# Patient Record
Sex: Female | Born: 1940
Health system: Southern US, Community
[De-identification: ages and names within clinical notes are randomized; demographics above are authoritative.]

## PROBLEM LIST (undated history)

## (undated) DIAGNOSIS — E785 Hyperlipidemia, unspecified: Secondary | ICD-10-CM

## (undated) DIAGNOSIS — Z9289 Personal history of other medical treatment: Secondary | ICD-10-CM

## (undated) DIAGNOSIS — Z860100 Personal history of colon polyps, unspecified: Secondary | ICD-10-CM

## (undated) DIAGNOSIS — I10 Essential (primary) hypertension: Secondary | ICD-10-CM

## (undated) DIAGNOSIS — S069XAA Unspecified intracranial injury with loss of consciousness status unknown, initial encounter: Secondary | ICD-10-CM

## (undated) DIAGNOSIS — G231 Progressive supranuclear ophthalmoplegia [Steele-Richardson-Olszewski]: Secondary | ICD-10-CM

## (undated) DIAGNOSIS — I8393 Asymptomatic varicose veins of bilateral lower extremities: Secondary | ICD-10-CM

## (undated) DIAGNOSIS — IMO0002 Reserved for concepts with insufficient information to code with codable children: Secondary | ICD-10-CM

## (undated) HISTORY — DX: Reserved for concepts with insufficient information to code with codable children: IMO0002

## (undated) HISTORY — DX: Personal history of other medical treatment: Z92.89

## (undated) HISTORY — DX: Personal history of colonic polyps: Z86.010

## (undated) HISTORY — PX: TUBAL LIGATION: SHX77

## (undated) HISTORY — DX: Personal history of colon polyps, unspecified: Z86.0100

## (undated) HISTORY — PX: TONSILLECTOMY: SHX5217

## (undated) HISTORY — DX: Essential (primary) hypertension: I10

## (undated) HISTORY — DX: Hyperlipidemia, unspecified: E78.5

## (undated) HISTORY — DX: Asymptomatic varicose veins of bilateral lower extremities: I83.93

## (undated) HISTORY — PX: POLYPECTOMY: SHX149

---

## 1998-11-12 ENCOUNTER — Other Ambulatory Visit: Admission: RE | Admit: 1998-11-12 | Discharge: 1998-11-12 | Payer: Self-pay | Admitting: Internal Medicine

## 1999-07-28 ENCOUNTER — Ambulatory Visit (HOSPITAL_COMMUNITY): Admission: RE | Admit: 1999-07-28 | Discharge: 1999-07-28 | Payer: Self-pay | Admitting: Gastroenterology

## 1999-07-28 ENCOUNTER — Encounter (INDEPENDENT_AMBULATORY_CARE_PROVIDER_SITE_OTHER): Payer: Self-pay | Admitting: Specialist

## 2000-02-07 DIAGNOSIS — Z9289 Personal history of other medical treatment: Secondary | ICD-10-CM

## 2000-02-07 HISTORY — DX: Personal history of other medical treatment: Z92.89

## 2000-06-30 ENCOUNTER — Other Ambulatory Visit: Admission: RE | Admit: 2000-06-30 | Discharge: 2000-06-30 | Payer: Self-pay | Admitting: Internal Medicine

## 2003-09-28 ENCOUNTER — Other Ambulatory Visit: Admission: RE | Admit: 2003-09-28 | Discharge: 2003-09-28 | Payer: Self-pay | Admitting: Internal Medicine

## 2004-05-09 ENCOUNTER — Ambulatory Visit: Payer: Self-pay | Admitting: Internal Medicine

## 2005-01-21 ENCOUNTER — Ambulatory Visit: Payer: Self-pay | Admitting: Internal Medicine

## 2005-02-11 ENCOUNTER — Ambulatory Visit: Payer: Self-pay | Admitting: Internal Medicine

## 2005-02-18 ENCOUNTER — Ambulatory Visit: Payer: Self-pay | Admitting: Internal Medicine

## 2005-03-09 LAB — HM MAMMOGRAPHY: HM Mammogram: NORMAL

## 2005-05-20 ENCOUNTER — Ambulatory Visit: Payer: Self-pay | Admitting: Internal Medicine

## 2005-06-30 ENCOUNTER — Ambulatory Visit: Payer: Self-pay | Admitting: Internal Medicine

## 2005-07-07 ENCOUNTER — Ambulatory Visit: Payer: Self-pay | Admitting: Internal Medicine

## 2005-07-07 ENCOUNTER — Other Ambulatory Visit: Admission: RE | Admit: 2005-07-07 | Discharge: 2005-07-07 | Payer: Self-pay | Admitting: Internal Medicine

## 2005-07-07 ENCOUNTER — Encounter: Payer: Self-pay | Admitting: Internal Medicine

## 2005-12-28 ENCOUNTER — Ambulatory Visit: Payer: Self-pay | Admitting: Internal Medicine

## 2006-05-25 ENCOUNTER — Ambulatory Visit: Payer: Self-pay | Admitting: Internal Medicine

## 2008-07-07 DIAGNOSIS — IMO0002 Reserved for concepts with insufficient information to code with codable children: Secondary | ICD-10-CM

## 2008-07-07 HISTORY — DX: Reserved for concepts with insufficient information to code with codable children: IMO0002

## 2008-09-17 ENCOUNTER — Ambulatory Visit: Payer: Self-pay | Admitting: Internal Medicine

## 2008-09-17 DIAGNOSIS — Z860100 Personal history of colon polyps, unspecified: Secondary | ICD-10-CM | POA: Insufficient documentation

## 2008-09-17 DIAGNOSIS — Z872 Personal history of diseases of the skin and subcutaneous tissue: Secondary | ICD-10-CM | POA: Insufficient documentation

## 2008-09-17 DIAGNOSIS — R030 Elevated blood-pressure reading, without diagnosis of hypertension: Secondary | ICD-10-CM | POA: Insufficient documentation

## 2008-09-17 DIAGNOSIS — E785 Hyperlipidemia, unspecified: Secondary | ICD-10-CM

## 2008-09-17 LAB — CONVERTED CEMR LAB
Albumin: 4.2 g/dL (ref 3.5–5.2)
BUN: 13 mg/dL (ref 6–23)
Basophils Absolute: 0 10*3/uL (ref 0.0–0.1)
Calcium: 9.1 mg/dL (ref 8.4–10.5)
Cholesterol: 264 mg/dL — ABNORMAL HIGH (ref 0–200)
Creatinine, Ser: 0.9 mg/dL (ref 0.4–1.2)
Direct LDL: 166.4 mg/dL
Eosinophils Absolute: 0 10*3/uL (ref 0.0–0.7)
GFR calc non Af Amer: 66.03 mL/min (ref >60)
Glucose, Bld: 100 mg/dL — ABNORMAL HIGH (ref 70–99)
HDL: 81.8 mg/dL (ref >39.00)
Lymphocytes Relative: 23.1 % (ref 12.0–46.0)
MCHC: 34.9 g/dL (ref 30.0–36.0)
MCV: 93 fL (ref 78.0–100.0)
Monocytes Absolute: 0.5 10*3/uL (ref 0.1–1.0)
Neutro Abs: 4.5 10*3/uL (ref 1.4–7.7)
Neutrophils Relative %: 68.4 % (ref 43.0–77.0)
RDW: 12.7 % (ref 11.5–14.6)
VLDL: 11.4 mg/dL (ref 0.0–40.0)

## 2008-09-18 ENCOUNTER — Encounter: Payer: Self-pay | Admitting: Internal Medicine

## 2008-09-18 LAB — CONVERTED CEMR LAB: Vit D, 25-Hydroxy: 19 ng/mL — ABNORMAL LOW (ref 30–89)

## 2008-09-27 ENCOUNTER — Ambulatory Visit: Payer: Self-pay | Admitting: Internal Medicine

## 2008-09-27 DIAGNOSIS — E559 Vitamin D deficiency, unspecified: Secondary | ICD-10-CM

## 2009-09-25 ENCOUNTER — Other Ambulatory Visit: Admission: RE | Admit: 2009-09-25 | Discharge: 2009-09-25 | Payer: Self-pay | Admitting: Internal Medicine

## 2009-09-25 ENCOUNTER — Ambulatory Visit: Payer: Self-pay | Admitting: Internal Medicine

## 2009-09-25 DIAGNOSIS — F418 Other specified anxiety disorders: Secondary | ICD-10-CM | POA: Insufficient documentation

## 2009-09-25 DIAGNOSIS — F438 Other reactions to severe stress: Secondary | ICD-10-CM | POA: Insufficient documentation

## 2009-09-25 DIAGNOSIS — M129 Arthropathy, unspecified: Secondary | ICD-10-CM | POA: Insufficient documentation

## 2009-09-25 LAB — HM PAP SMEAR

## 2009-10-04 ENCOUNTER — Ambulatory Visit: Payer: Self-pay | Admitting: Internal Medicine

## 2009-12-27 ENCOUNTER — Ambulatory Visit: Payer: Self-pay | Admitting: Internal Medicine

## 2009-12-27 LAB — CONVERTED CEMR LAB
Direct LDL: 186.2 mg/dL
Total CHOL/HDL Ratio: 4
VLDL: 19 mg/dL (ref 0.0–40.0)

## 2010-01-03 ENCOUNTER — Ambulatory Visit: Payer: Self-pay | Admitting: Internal Medicine

## 2010-04-06 LAB — CONVERTED CEMR LAB
BUN: 15 mg/dL (ref 6–23)
Basophils Absolute: 0 10*3/uL (ref 0.0–0.1)
Bilirubin, Direct: 0.1 mg/dL (ref 0.0–0.3)
CO2: 28 meq/L (ref 19–32)
Calcium: 9.6 mg/dL (ref 8.4–10.5)
Cholesterol: 305 mg/dL — ABNORMAL HIGH (ref 0–200)
Creatinine, Ser: 0.8 mg/dL (ref 0.4–1.2)
Eosinophils Absolute: 0.1 10*3/uL (ref 0.0–0.7)
HCT: 43.3 % (ref 36.0–46.0)
Hemoglobin: 15 g/dL (ref 12.0–15.0)
Lymphs Abs: 2 10*3/uL (ref 0.7–4.0)
MCHC: 34.5 g/dL (ref 30.0–36.0)
MCV: 94.3 fL (ref 78.0–100.0)
Monocytes Absolute: 0.6 10*3/uL (ref 0.1–1.0)
Neutro Abs: 4.5 10*3/uL (ref 1.4–7.7)
Platelets: 291 10*3/uL (ref 150.0–400.0)
RDW: 13.6 % (ref 11.5–14.6)
TSH: 1.6 microintl units/mL (ref 0.35–5.50)
Total CHOL/HDL Ratio: 4
Total Protein: 7.1 g/dL (ref 6.0–8.3)
Triglycerides: 92 mg/dL (ref 0.0–149.0)
Uric Acid, Serum: 6 mg/dL (ref 2.4–7.0)

## 2010-04-08 NOTE — Assessment & Plan Note (Signed)
Summary: 3 month fup///ccm   Vital Signs:  Patient profile:   70 year old female Menstrual status:  postmenopausal Weight:      145 pounds BMI:     27.27 Pulse rate:   80 / minute BP sitting:   170 / 90  (left arm) Cuff size:   regular  Vitals Entered By: Romualdo Bolk, CMA (AAMA) (January 03, 2010 11:01 AM)  Serial Vital Signs/Assessments:  Time      Position  BP       Pulse  Resp  Temp     By 11:04 AM            179/100                        Romualdo Bolk, CMA (AAMA)                     160/88                         [name] MD  Comments: 11:04 AM Pt's machine By: Romualdo Bolk, CMA (AAMA)  left arm sitting By: [name] MD   CC: follow-up visit   History of Present Illness: Pamela Alvarez comes in today  for  above . with BP  and  Since last visit  she has implemeted som lifestyle changes   exercising  well and   feels well  She brings in her BP machine as instructed . Her BP   readings  are usually below 130 at home  .  as they were this am  .   she takes her reasing almost daily.,  She thinks her bp elevates with stress   .  and not frequently.  alcohol almost daily but 1-2 and not excessive   Preventive Screening-Counseling & Management  Alcohol-Tobacco     Alcohol drinks/day: 1     Alcohol type: wine     Smoking Status: never     Passive Smoke Exposure: no  Caffeine-Diet-Exercise     Caffeine use/day: 2     Does Patient Exercise: yes     Type of exercise: walking  Current Medications (verified): 1)  Alprazolam 0.25 Mg Tabs (Alprazolam) .Marland Kitchen.. 1 By Mouth As Needed Pre Flight  Allergies (verified): 1)  ! Sulfa  Past History:  Past medical, surgical, family and social histories (including risk factors) reviewed, and no changes noted (except as noted below).  Past Medical History: Reviewed history from 09/17/2008 and no changes required. Hyperlipidemia Colonic polyps, hx of Echo 12/01   Cyst on back x 2 that were  drained May 2010 G3 P2  Past Surgical History: Reviewed history from 09/17/2008 and no changes required. Tonsillectomy Tubal ligation Colon polypectomy  Past History:  Care Management: G[facility]enterology: [facility] Taffeen:  [facility]   Family History: Reviewed history from 09/17/2008 and no changes required. Father: Heart Problems, angina and heart and lung cancer, skin cancer, colon cancer Mother: HBP, Parkinson's  Siblings: Brother-triple bypass surgery age 62, sister-breast cancer over 50 and dm, sister-macular dengeration, PAD and hearing problems, prediabetic. Sis with vasculitis   Social History: Reviewed history from 10/04/2009 and no changes required. 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    Physical Exam  General:  Well-developed,well-nourished,in no acute distress; alert,appropriate and cooperative throughout examination Head:  normocephalic and atraumatic.   Eyes:  vision grossly intact.   Neck:  No deformities, masses, or tenderness noted. Lungs:  normal respiratory effort and no intercostal retractions.   Heart:  normal rate, regular rhythm, and no murmur.   Pulses:  pulses intact without delay   Extremities:  no clubbing cyanosis or edema  Neurologic:  non focal  Psych:  Oriented X3, good eye contact, not anxious appearing, and not depressed appearing.      Impression & Recommendations:  Problem # 1:  HYPERTENSION, WHITE COAT (ICD-796.2) impressive readings  here but same machine records normal at home.   would rx if  mostly up  try the  xanax  under stress to see if changes the readings.  Problem # 2:  HYPERLIPIDEMIA (ICD-272.4) Assessment: Improved still quite high    reviewed flow sheet     rec get ldl to below 160  disc meds and consideration of low dose lipitor if needed.  she will continue lifestyle intervention and  plan repeat at check up.  call in the meantime .  would not use supplements to treat either condition as not   tested.   Complete Medication List: 1)  Alprazolam 0.25 Mg Tabs (Alprazolam) .Marland Kitchen.. 1 by mouth as needed pre flight  Other Orders: Admin 1st Vaccine (60454) Flu Vaccine 81yrs + (09811)  Patient Instructions: 1)  continue lifestyle intervention  . for your lipids and BP 2)  DASh and Metiterranean diet.  3)  Check bp readings after exercise and stress situations and can try the xanax about how  BP is then.  4)  Call if  want to try lipid med. 5)  Call if BP readings are not controlled more than 80% of the time or stays high 150 and over.  6)  Check up in July 2012.  7)  call in meantime   .    Orders Added: 1)  Admin 1st Vaccine [90471] 2)  Flu Vaccine 70yrs + [91478] 3)  Est. Patient Level IV [29562]   Flu Vaccine Consent Questions     Do you have a history of severe allergic reactions to this vaccine? no    Any prior history of allergic reactions to egg and/or gelatin? no    Do you have a sensitivity to the preservative Thimersol? no    Do you have a past history of Guillan-Barre Syndrome? no    Do you currently have an acute febrile illness? no    Have you ever had a severe reaction to latex? no    Vaccine information given and explained to patient? yes    Are you currently pregnant? no    Lot Number:[id number]   Exp Date:09/06/2010   Site Given  Left Deltoid IM .lbflu Romualdo Bolk, CMA (AAMA)  January 03, 2010 11:03 AM greater than 50% of visit spent in counseling   25 mintues

## 2010-04-08 NOTE — Assessment & Plan Note (Signed)
Summary: cpx/pt coming in fasting/cjr   Vital Signs:  Patient profile:   70 year old female Menstrual status:  postmenopausal Height:      61.25 inches Weight:      145 pounds BMI:     27.27 Pulse rate:   66 / minute BP sitting:   170 / 90  (left arm) Cuff size:   regular  Vitals Entered By: Romualdo Bolk, CMA (AAMA) (September 25, 2009 9:35 AM)  Serial Vital Signs/Assessments:  Time      Position  BP       Pulse  Resp  Temp     By                     165/70                         [name] MD  Comments: sitting  reg cuff By: [name] MD   CC: CPX- Pt is fasting for labs- Discuss doing a pap- Pt wants to discuss something to put on cuts that she gets a home besides neomycin.   History of Present Illness: Pamela Alvarez comes in today  for yearly check  Medicare AWV:  1.   Risk factors based on Past M, S, F history: hx of elevated lipids and bp readings  2.   Physical Activities:   regular  no limitations  3.   Depression/mood:     no depression  gets anxious at doctors  4.   Hearing: no  limitaiton 5.   ADL's:   Independent  6.   Fall Risk:  none  7.   Home Safety:  adressed  8.   Height, weight, &visual acuity:   SEEs   Dr Lovell Sheehan   9.   Counseling:  10.   Labs ordered based on risk factors:  see below  11.           Referral Coordination 12.           Care Plan 13.            Cognitive Assessment    Pt is A&Ox3,affect,speech,memory,attention,&motor skills appear intact.  BP readings at home.     149/  132   124   /70  .Marland KitchenMarland Kitchen..145...126/74    131/68.    Refill xanax as needed.   never got the last one filled but would like to be re written UTD on HCM but declines a        dexa.     Preventive Care Screening  Prior Values:    Pap Smear:  normal (07/07/2005)    Mammogram:  normal (03/09/2005)    Colonoscopy:  Done (04/30/2005)    Last Tetanus Booster:  Td (07/07/2008)    Last Pneumovax:  Pneumovax (09/17/2008)   Preventive Screening-Counseling &  Management  Alcohol-Tobacco     Alcohol drinks/day: 1     Alcohol type: wine     Smoking Status: never     Passive Smoke Exposure: no  Caffeine-Diet-Exercise     Caffeine use/day: 2     Does Patient Exercise: yes     Type of exercise: walking  Comments: denies depression  Hep-HIV-STD-Contraception     Dental Visit-last 6 months yes     Sun Exposure-Excessive: no  Safety-Violence-Falls     Seat Belt Use: yes     Firearms in the Home: no firearms in the home  Smoke Detectors: yes     Fall Risk: no     Fall Risk Counseling: not indicated; no significant falls noted  Current Medications (verified): 1)  Alprazolam 0.25 Mg Tabs (Alprazolam) .Marland Kitchen.. 1 By Mouth As Needed Pre Flight  Allergies (verified): 1)  ! Sulfa  Past History:  Past medical, surgical, family and social histories (including risk factors) reviewed, and no changes noted (except as noted below).  Past Medical History: Reviewed history from 09/17/2008 and no changes required. Hyperlipidemia Colonic polyps, hx of Echo 12/01   Cyst on back x 2 that were drained May 2010 G3 P2  Past Surgical History: Reviewed history from 09/17/2008 and no changes required. Tonsillectomy Tubal ligation Colon polypectomy  Past History:  Care Management: [facility]enterology: [facility] Taffeen:  [facility]   Family History: Reviewed history from 09/17/2008 and no changes required. Father: Heart Problems, angina and heart and lung cancer, skin cancer, colon cancer Mother: HBP, Parkinson's  Siblings: Brother-triple bypass surgery age 35, sister-breast cancer over 50 and dm, sister-macular dengeration, PAD and hearing problems, prediabetic. Sis with vasculitis   Social History: Reviewed history from 09/17/2008 and no changes required. 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         bumps on hands   arthritis    non cancerous.   rest of ros negative   12 system   Physical Exam  General:  Well-developed,well-nourished,in no acute distress; alert,appropriate and cooperative throughout examination Head:  Normocephalic and atraumatic without obvious abnormalities. No apparent alopecia or balding. Eyes:  PERRL, EOMs full, conjunctiva clear   glasses Ears:  R ear normal, L ear normal, and no external deformities.   Nose:  no external deformity, no external erythema, and no nasal discharge.   Mouth:  pharynx pink and moist.   Neck:  No deformities, masses, or tenderness noted. Chest Wall:  No deformities, masses, or tenderness noted. Breasts:  No mass, nodules, thickening, tenderness, bulging, retraction, inflamation, nipple discharge or skin changes noted.   Lungs:  Normal respiratory effort, chest expands symmetrically. Lungs are clear to auscultation, no crackles or wheezes.no dullness.   Heart:  Normal rate and regular rhythm. S1 and S2 normal without gallop, murmur, click, rub or other extra sounds.no lifts.   Abdomen:  Bowel sounds positive,abdomen soft and non-tender without masses, organomegaly or hernias noted. Rectal:  No external abnormalities noted. Normal sphincter tone. No rectal masses or tenderness. Genitalia:  Pelvic Exam:        External: normal female genitalia without lesions or masses        Vagina: normal without lesions or masses        Cervix: normal without lesions or masses        Adnexa: normal bimanual exam without masses or  fullness        Uterus: normal by palpation        Pap smear: performed Msk:  oa changes  with nodule left dip joint  non tenderno redness over joints.   Pulses:  pulses intact without delay  Extremities:  no clubbing cyanosis or edema  Neurologic:  alert & oriented X3, cranial nerves II-XII intact, strength normal in all extremities, gait normal, and DTRs symmetrical and normal.   A&Ox3,affect,speech,memory,attention,&motor skills appear intact.  Skin:  turgor normal, color normal, no petechiae, and no purpura.  sunchanges  Cervical Nodes:  No lymphadenopathy noted Axillary Nodes:  No palpable lymphadenopathy Inguinal Nodes:  No significant adenopathy Psych:  Oriented X3, normally interactive, good eye contact, not depressed appearing, and slightly anxious.   EKG  nsr  except rate 100      90 during exam.   Impression & Recommendations:  Problem # 1:  HEALTH MAINTENANCE EXAM, ADULT (ICD-V70.0) medicare wellness declined Dexa  UTD on other parameters   counseled    Discussed nutrition,exercise,diet,healthy weight, vitamin D and calcium.   Orders: EKG w/ Interpretation (93000) First annual wellness visit with prevention plan  (U4403) Pelvic & Breast Exam ( Medicare)  (G0101) Obtaining Screening PAP Smear (K7425)  Problem # 2:  ELEVATED BLOOD PRESSURE WITHOUT DIAGNOSIS OF HYPERTENSION (ICD-796.2) reviewed her readings    but still very high  and would like to have her machine correlated withours for obv reasons  as baseline ht should still be treated and is a  risk  Orders: TLB-BMP (Basic Metabolic Panel-BMET) (80048-METABOL) TLB-Hepatic/Liver Function Pnl (80076-HEPATIC) TLB-TSH (Thyroid Stimulating Hormone) (84443-TSH) TLB-Lipid Panel (80061-LIPID) TLB-CBC Platelet - w/Differential (85025-CBCD) TLB-Uric Acid, Blood (84550-URIC) EKG w/ Interpretation (93000)  Problem # 3:  ARTHRITIS (ICD-716.90) some nodules  prob oa  check uric acid  Orders: TLB-Uric Acid, Blood  (84550-URIC)  Problem # 4:  HYPERLIPIDEMIA (ICD-272.4) recheck today     follow up depnding on results  Orders: TLB-BMP (Basic Metabolic Panel-BMET) (80048-METABOL) TLB-Hepatic/Liver Function Pnl (80076-HEPATIC) TLB-TSH (Thyroid Stimulating Hormone) (84443-TSH) TLB-Lipid Panel (80061-LIPID) EKG w/ Interpretation (93000)  Labs Reviewed: SGOT: 33 (09/17/2008)   SGPT: 19 (09/17/2008)  Prior 10 Yr Risk Heart Disease: Not enough information (09/27/2008)   HDL:81.80 (09/17/2008)  Chol:264 (09/17/2008)  Trig:57.0 (09/17/2008)  Problem # 5:  ANXIETY, SITUATIONAL (ICD-308.3) xanax as needed ... Discussed risk benefit    Problem # 6:  COLONIC POLYPS, HX OF (ICD-V12.72) Assessment: Comment Only  Complete Medication List: 1)  Alprazolam 0.25 Mg Tabs (Alprazolam) .Marland Kitchen.. 1 by mouth as needed pre flight  Patient Instructions: 1)  make a  nurse visit to comein with your BP machine   . 2)  rov if elevated Bp at home. 3)  You will be informed of lab results when available.  Prescriptions: ALPRAZOLAM 0.25 MG TABS (ALPRAZOLAM) 1 by mouth as needed pre flight  #20 x 0   Entered and Authorized by:   [name] MD   Signed by:   [name] MD on 09/25/2009   Method used:   Print then Give to Patient   RxID:   [id number]

## 2010-04-08 NOTE — Assessment & Plan Note (Signed)
Summary: Discuss going on cholesterol meds and pt to bring in bp machi...   Vital Signs:  Patient profile:   70 year old female Menstrual status:  postmenopausal Weight:      146 pounds Pulse rate:   72 / minute BP sitting:   160 / 80  (left arm) Cuff size:   regular  Vitals Entered By: Romualdo Bolk, CMA Duncan Dull) (October 04, 2009 2:21 PM) CC: Discuss going on cholesterol meds   History of Present Illness: Pamela Alvarez comes in today  for elevated lipids and BP issues felt to be white coat. She states that has  not eaten healthy recently and coudl do beter.  No hx of premature CVd in family .  Bp seems ok at  hom e but sometimes  in 140s .... Willing to take med is absolutely has to .  Preventive Screening-Counseling & Management  Alcohol-Tobacco     Alcohol drinks/day: 1     Alcohol type: wine     Smoking Status: never     Passive Smoke Exposure: no  Caffeine-Diet-Exercise     Caffeine use/day: 2     Does Patient Exercise: yes     Type of exercise: walking  Current Medications (verified): 1)  Alprazolam 0.25 Mg Tabs (Alprazolam) .Marland Kitchen.. 1 By Mouth As Needed Pre Flight  Allergies (verified): 1)  ! Sulfa  Past History:  Past medical, surgical, family and social histories (including risk factors) reviewed for relevance to current acute and chronic problems.  Past Medical History: Reviewed history from 09/17/2008 and no changes required. Hyperlipidemia Colonic polyps, hx of Echo 12/01   Cyst on back x 2 that were drained May 2010 G3 P2  Past Surgical History: Reviewed history from 09/17/2008 and no changes required. Tonsillectomy Tubal ligation Colon polypectomy  Past History:  Care Management: [facility]astroenterology: [facility] Taffeen:  [facility]   Family History: Reviewed history from 09/17/2008 and no changes required. Father: Heart Problems, angina and heart and lung cancer, skin cancer, colon cancer Mother: HBP, Parkinson's  Siblings:  Brother-triple bypass surgery age 79, sister-breast cancer over 50 and dm, sister-macular dengeration, PAD and hearing problems, prediabetic. Sis with vasculitis   Social History: Reviewed history from 09/25/2009 and no changes required. 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     Impression & Recommendations:  Problem # 1:  HYPERLIPIDEMIA (ICD-272.4) ldl over 200  160 range in the past reviewed  [place] risk   assessment .   with patient  and imporance of bp control to risk   to elevated to moderate vs lower risk.   Labs Reviewed: SGOT: 28 (09/25/2009)   SGPT: 24 (09/25/2009)  Prior 10 Yr Risk Heart Disease: Not enough information (09/27/2008)   HDL:80.20 (09/25/2009), 81.80 (09/17/2008)  Chol:305 (09/25/2009), 264 (09/17/2008)  Trig:92.0 (09/25/2009), 57.0 (09/17/2008)  Problem # 2:  ELEVATED BLOOD PRESSURE WITHOUT DIAGNOSIS OF HYPERTENSION (ICD-796.2) dis importance of control on risk and consider start a low dose med ifelevated at home.   Complete Medication List: 1)  Alprazolam 0.25 Mg Tabs (Alprazolam) .Marland Kitchen.. 1 by mouth as needed pre flight  Patient Instructions: 1)  intensive lifestyle intervention for your lipids as we discussed 2)  Also monitor your blood pressure and if 140 and above  consistently call and we should consider adding medication. 3)  Lipid  panel prior to visit ICD-9 :  4)  Please schedule a follow-up appointment in 3-4  months .  5)  Bring your blood  pressure   machine to next visit.   greater than 50% of visit spent in counseling   20 minutes

## 2010-07-25 NOTE — Procedures (Signed)
[place]. [facility]  Patient:    Pamela Alvarez, Pamela Alvarez                          MRN: [id number] Proc. Date: 07/28/99 Adm. Date:  [id number] Disc. Date: [id number] Attending:  Rich Brave CC:         Neta Mends. Panosh, M.D. [facility]                           Procedure Report  PROCEDURE PERFORMED:  Colonoscopy with polypectomy and hot biopsies.  ENDOSCOPIST:  Florencia Reasons, M.D.  INDICATIONS FOR PROCEDURE:  The patient is a 70 year old with a family history of colon cancer.  FINDINGS:  Approximately six polyps removed.  Mild diverticulosis.  DESCRIPTION OF PROCEDURE:  The nature, purpose and risks of the procedure had been discussed with the patient, who provided written consent.  At her request, very light sedation was used, totaling fentanyl 45 mcg and Versed 3.5 mg without arrhythmias or desaturation.  The Olympus adjustable tension pediatric video colonoscope was advanced around the colon with just difficulty, entering the terminal ileum for a short distance.  It appeared normal and pullback was performed.  The quality of the prep was excellent and it is felt that all areas were adequately seen.  During insertion of the scope and also during pullback, I encountered a couple of small, sessile, hyperplastic-appearing polyps in the left colon, which I removed by cold biopsy technique.  There was also a linear polyp in a sulcus at about 50 or 55 cm, initially difficult to see, consisting of some sessile verrucous mucosa without an actual morphologic polyp being present.  Multiple (approximately three or four) hot biopsies with generous, but not excessive application of heat were utilized to ablate this lesion and when I was finished, it looked like it had been fairly well coagulated.  At about 15 cm, I encountered two semipedunculated polyps removed by snare technique with complete hemostasis and no evidence of excessive cautery. Retroflexion in the  distal rectum disclosed no additional abnormalities.  The patient tolerated the procedure well and there were no apparent complications.  IMPRESSION:  Five or six colon polyps removed as described above.  The patient also had some small mouthed diverticula in the left colon, no colitis, vascular malformations, large polyps or cancer.  PLAN:  Await pathology on  the polyps, with anticipated colonoscopic follow-up in about three years. DD:  07/28/99 TD:  08/01/99 Job: [id number]

## 2010-12-08 ENCOUNTER — Ambulatory Visit (INDEPENDENT_AMBULATORY_CARE_PROVIDER_SITE_OTHER): Payer: Medicare Other

## 2010-12-08 DIAGNOSIS — Z23 Encounter for immunization: Secondary | ICD-10-CM

## 2011-02-11 ENCOUNTER — Other Ambulatory Visit: Payer: Self-pay | Admitting: Gastroenterology

## 2011-02-11 LAB — HM COLONOSCOPY

## 2011-04-10 ENCOUNTER — Telehealth: Payer: Self-pay | Admitting: *Deleted

## 2011-04-10 NOTE — Telephone Encounter (Signed)
Pt is wanting to be work in sooner than May for a cpx. Where can we work her in at?

## 2011-04-14 NOTE — Telephone Encounter (Signed)
There are lots of Wednesday slots open per guidelines  In [employer] and  Otherwise tuesdays  Monday s Before 2 . See  If this will work for her .  Also  Remember Wednesday is open except for for   slots to remain.  And  dont need to ask about scheduling  Here.

## 2011-04-16 NOTE — Telephone Encounter (Signed)
Pt cpx had already been sch for 05/19/11 at 9:15. This appt was originally made in Oct 2012.

## 2011-05-19 ENCOUNTER — Encounter: Payer: Medicare Other | Admitting: Internal Medicine

## 2011-07-21 ENCOUNTER — Encounter: Payer: Self-pay | Admitting: Internal Medicine

## 2011-07-22 ENCOUNTER — Ambulatory Visit (INDEPENDENT_AMBULATORY_CARE_PROVIDER_SITE_OTHER): Payer: Medicare Other | Admitting: Internal Medicine

## 2011-07-22 ENCOUNTER — Encounter: Payer: Self-pay | Admitting: Internal Medicine

## 2011-07-22 VITALS — BP 140/78 | HR 112 | Temp 97.5°F | Ht 61.5 in | Wt 149.0 lb

## 2011-07-22 DIAGNOSIS — R030 Elevated blood-pressure reading, without diagnosis of hypertension: Secondary | ICD-10-CM

## 2011-07-22 DIAGNOSIS — E785 Hyperlipidemia, unspecified: Secondary | ICD-10-CM

## 2011-07-22 DIAGNOSIS — Z860100 Personal history of colon polyps, unspecified: Secondary | ICD-10-CM

## 2011-07-22 DIAGNOSIS — M129 Arthropathy, unspecified: Secondary | ICD-10-CM

## 2011-07-22 DIAGNOSIS — Z0000 Encounter for general adult medical examination without abnormal findings: Secondary | ICD-10-CM

## 2011-07-22 DIAGNOSIS — Z23 Encounter for immunization: Secondary | ICD-10-CM

## 2011-07-22 LAB — LIPID PANEL
Cholesterol: 284 mg/dL — ABNORMAL HIGH (ref 0–200)
HDL: 78.4 mg/dL (ref >39.00)
Total CHOL/HDL Ratio: 4
Triglycerides: 67 mg/dL (ref 0.0–149.0)
VLDL: 13.4 mg/dL (ref 0.0–40.0)

## 2011-07-22 LAB — CBC WITH DIFFERENTIAL/PLATELET
Basophils Relative: 0.7 % (ref 0.0–3.0)
Eosinophils Relative: 1.6 % (ref 0.0–5.0)
Hemoglobin: 14.9 g/dL (ref 12.0–15.0)
Lymphocytes Relative: 27.8 % (ref 12.0–46.0)
Monocytes Relative: 6.2 % (ref 3.0–12.0)
Neutro Abs: 5 10*3/uL (ref 1.4–7.7)
Neutrophils Relative %: 63.7 % (ref 43.0–77.0)
RBC: 4.8 Mil/uL (ref 3.87–5.11)
WBC: 7.9 10*3/uL (ref 4.5–10.5)

## 2011-07-22 LAB — BASIC METABOLIC PANEL
CO2: 24 mEq/L (ref 19–32)
Calcium: 9.4 mg/dL (ref 8.4–10.5)
GFR: 68.1 mL/min (ref >60.00)
Sodium: 141 mEq/L (ref 135–145)

## 2011-07-22 LAB — HEPATIC FUNCTION PANEL
AST: 28 U/L (ref 0–37)
Albumin: 4 g/dL (ref 3.5–5.2)
Alkaline Phosphatase: 49 U/L (ref 39–117)
Bilirubin, Direct: 0.1 mg/dL (ref 0.0–0.3)
Total Protein: 7.3 g/dL (ref 6.0–8.3)

## 2011-07-22 NOTE — Progress Notes (Signed)
Subjective:    Patient ID: Pamela Alvarez, female    DOB: May 26, 1940, 71 y.o.   MRN: [id number]  HPI Patient comes in today for Preventive Health Care visit  No major changes ; ,injury surgery or hospitalizations. Concern: Finger  Stiff and painful in am   Poss arthritis no others no prgression but here for about 6 months no meds. bp readings at home below 140/90 on a reg bases . Hx of WC effect in the past  LIPIDS no meds healthy eating  vegetarian Taking food and stopped vit d supplements   Hearing:   ok  Vision:  No limitations at present . Glasses   Safety:  Has smoke detector and wears seat belts.  No firearms. No excess sun exposure. Sees dentist regularly.  Falls: fell once   Down stairs  Not paying attention and nos ig injury  Advance directive :  Reviewed  Has one.  Memory: Felt to be good  , no concern from her or her family.  Depression: No anhedonia unusual crying or depressive symptoms  Nutrition: Eats well balanced diet; adequate calcium and vitamin D. No swallowing chewiing problems.  Injury: no major injuries in the last six months.  Other healthcare providers:  Reviewed today .  Social:  Lives with husband married. No pets.  hh of 2   Preventive parameters: up-to-date on colonoscopy, mammogram, immunizations. Including Tdap and pneumovax.  ADLS:   There are no problems or need for assistance  driving, feeding, obtaining food, dressing, toileting and bathing, managing money using phone. She is independent.  Exercises walking   etoh glass wine with dinner.  No tobacco   No ets.   Seatbelts smoke alarm no tanning beds no fa  Review of Systems ROS:  GEN/ HEENT: No fever, significant weight changes sweats headaches vision problems hearing changes, has allergyeyes  CV/ PULM; No chest pain shortness of breath cough, syncope,edema  change in exercise tolerance. GI /GU: No adominal pain, vomiting, change in bowel habits. No blood in the stool. No significant GU  symptoms. SKIN/HEME: ,no acute skin rashes suspicious lesions or bleeding. No lymphadenopathy, nodules, masses.  NEURO/ PSYCH:  No neurologic signs such as weakness numbness. No depression anxiety. IMM/ Allergy: No unusual infections.  Allergy .  ? In eyes per eye doc REST of 12 system review negative except as per HPI Past history family history social history reviewed in the electronic medical record. Outpatient Encounter Prescriptions as of 07/22/2011  Medication Sig Dispense Refill  . ALPRAZolam (XANAX) 0.25 MG tablet Take 0.25 mg by mouth. Take one tablet by mouth as needed pre flight.      rare usage.      Objective:   Physical Exam BP 140/78  Pulse 112  Temp(Src) 97.5 F (36.4 C) (Oral)  Ht 5' 1.5" (1.562 m)  Wt 149 lb (67.586 kg)  BMI 27.70 kg/m2  SpO2 97% Physical Exam: Vital signs reviewed WUJ:WJXB is a well-developed well-nourished alert cooperative  white female who appears her stated age in no acute distress.  HEENT: normocephalic atraumatic , Eyes: PERRL EOM's full, conjunctiva mildy weepy, galsses  Nares: paten,t no deformity discharge or tenderness., Ears: no deformity EAC's clear TMs with normal landmarks. Mouth: clear OP, no lesions, edema.  Moist mucous membranes. Dentition in adequate repair. NECK: supple without masses, thyromegaly or bruits. CHEST/PULM:  Clear to auscultation and percussion breath sounds equal no wheeze , rales or rhonchi. No chest wall deformities or tenderness. Breast: normal by inspection . No  dimpling, discharge, masses, tenderness or discharge . CV: PMI is nondisplaced, S1 S2 no gallops, murmurs, rubs. Peripheral pulses are full without delay.No JVD .  ABDOMEN: Bowel sounds normal nontender  No guard or rebound, no hepato splenomegal no CVA tenderness.  No hernia. Extremtities:  No clubbing cyanosis or edema, oa changes fingers pip and dip  Area no redness no atrophy NEURO:  Oriented x3, cranial nerves 3-12 appear to be intact, no obvious  focal weakness,gait within normal limits no abnormal reflexes or asymmetrical SKIN: No acute rashes normal turgor, color, no bruising or petechiae. Sun changes  PSYCH: Oriented, good eye contact, no obvious depression anxiety, cognition and judgment appear normal. LN: no cervical axillary inguinal adenopathy    Assessment & Plan:  Preventive Health Care Counseled regarding healthy nutrition, exercise, sleep, injury prevention, calcium vit d and healthy weight .zostavax today :ho on ca vit d  LIPIDS check today   biggest risk factor is age . consider adding asa 81  Qd or 3 x per week. If elevated ldl 200 and over consider statin meds . But cautious with this,. Hx of wch and anxiety better Monitor bp . Fu if eleated at  home arthritis hands  Seems like oa check screening labs today and  Fu if progressive.  Eyes can try otc allergy med to see if helps eyes.

## 2011-07-22 NOTE — Patient Instructions (Signed)
Continue lifestyle intervention healthy eating and exercise . The hand pain seems like osteoarthritis but fu with [place] if  Concerning or progressive  . [place] vaccine today  Calcium vit d  In diet as directed. consider  Baby asa to decrease Cv risk . When  Your lipid s come back i will do a risk score for you and let you decide on medication.

## 2011-07-23 LAB — CYCLIC CITRUL PEPTIDE ANTIBODY, IGG: Cyclic Citrullin Peptide Ab: <2 U/mL (ref 0.0–5.0)

## 2011-07-25 ENCOUNTER — Encounter: Payer: Self-pay | Admitting: Internal Medicine

## 2011-07-25 DIAGNOSIS — Z0000 Encounter for general adult medical examination without abnormal findings: Secondary | ICD-10-CM | POA: Insufficient documentation

## 2011-07-29 NOTE — Progress Notes (Signed)
Quick Note:  Left a message for return call. ______ 

## 2011-07-29 NOTE — Progress Notes (Signed)
Quick Note:  Spoke with pt and pt is aware of results. ______ 

## 2011-12-29 ENCOUNTER — Ambulatory Visit: Payer: Medicare Other

## 2011-12-31 ENCOUNTER — Ambulatory Visit (INDEPENDENT_AMBULATORY_CARE_PROVIDER_SITE_OTHER): Payer: Medicare Other

## 2011-12-31 DIAGNOSIS — Z23 Encounter for immunization: Secondary | ICD-10-CM

## 2012-07-22 ENCOUNTER — Encounter: Payer: Self-pay | Admitting: Internal Medicine

## 2012-07-22 ENCOUNTER — Ambulatory Visit (INDEPENDENT_AMBULATORY_CARE_PROVIDER_SITE_OTHER): Payer: Medicare Other | Admitting: Internal Medicine

## 2012-07-22 VITALS — BP 160/80 | HR 100 | Temp 97.6°F | Ht 61.25 in | Wt 150.0 lb

## 2012-07-22 DIAGNOSIS — M25569 Pain in unspecified knee: Secondary | ICD-10-CM

## 2012-07-22 DIAGNOSIS — R030 Elevated blood-pressure reading, without diagnosis of hypertension: Secondary | ICD-10-CM

## 2012-07-22 DIAGNOSIS — M25561 Pain in right knee: Secondary | ICD-10-CM

## 2012-07-22 DIAGNOSIS — E785 Hyperlipidemia, unspecified: Secondary | ICD-10-CM

## 2012-07-22 DIAGNOSIS — M129 Arthropathy, unspecified: Secondary | ICD-10-CM

## 2012-07-22 DIAGNOSIS — Z0110 Encounter for examination of ears and hearing without abnormal findings: Secondary | ICD-10-CM

## 2012-07-22 DIAGNOSIS — Z0000 Encounter for general adult medical examination without abnormal findings: Secondary | ICD-10-CM

## 2012-07-22 LAB — BASIC METABOLIC PANEL
BUN: 14 mg/dL (ref 6–23)
CO2: 25 mEq/L (ref 19–32)
Chloride: 104 mEq/L (ref 96–112)
Glucose, Bld: 78 mg/dL (ref 70–99)
Potassium: 3.8 mEq/L (ref 3.5–5.1)
Sodium: 137 mEq/L (ref 135–145)

## 2012-07-22 LAB — LIPID PANEL
Cholesterol: 284 mg/dL — ABNORMAL HIGH (ref 0–200)
HDL: 70 mg/dL (ref >39.00)
Total CHOL/HDL Ratio: 4
Triglycerides: 77 mg/dL (ref 0.0–149.0)
VLDL: 15.4 mg/dL (ref 0.0–40.0)

## 2012-07-22 LAB — HEPATIC FUNCTION PANEL
ALT: 22 U/L (ref 0–35)
AST: 27 U/L (ref 0–37)
Albumin: 3.9 g/dL (ref 3.5–5.2)
Alkaline Phosphatase: 53 U/L (ref 39–117)
Total Protein: 7.3 g/dL (ref 6.0–8.3)

## 2012-07-22 LAB — CBC WITH DIFFERENTIAL/PLATELET
Basophils Relative: 0.9 % (ref 0.0–3.0)
Eosinophils Absolute: 0.2 10*3/uL (ref 0.0–0.7)
Eosinophils Relative: 2.7 % (ref 0.0–5.0)
Hemoglobin: 15.3 g/dL — ABNORMAL HIGH (ref 12.0–15.0)
Lymphocytes Relative: 33.7 % (ref 12.0–46.0)
Monocytes Relative: 7.9 % (ref 3.0–12.0)
Neutro Abs: 3.8 10*3/uL (ref 1.4–7.7)
Neutrophils Relative %: 54.8 % (ref 43.0–77.0)
RBC: 4.88 Mil/uL (ref 3.87–5.11)
WBC: 7 10*3/uL (ref 4.5–10.5)

## 2012-07-22 NOTE — Progress Notes (Signed)
Chief Complaint  Patient presents with  . Medicare Wellness    HPI: Patient comes in today for Preventive Medicare wellness visit . No major injuries, ed visits ,hospitalizations , new medications since last visit. Some problems with her knees at times and stairs anterior no injury would like some advice about.Marland Kitchen No swelling. Blood pressure readings are usually very good at home on the second and third reading. She took her readings this morning and the second reading was in the 124 127 range. She's been known to have white coat effect and this brought her machine in before and have [place] check it.  Is on a every 3 year colonoscopy recall. Has chosen to not get a mammogram recently. Has a history of a lower vitamin D level hasn't been taking it recently. Not taking Xanax anymore. It was situational    Hearing: Okay  Vision:  No limitations at present . Last eye check UTD has glasses  Safety:  Has smoke detector and wears seat belts.  No firearms. No excess sun exposure. Sees dentist regularly.  Falls: No   Advance directive :  Reviewed  Has one.  Memory: Felt to be good  , no concern from her or her family.  Depression: No anhedonia unusual crying or depressive symptoms  Nutrition: Eats well balanced diet; adequate calcium and vitamin D. No swallowing chewing problems.  Injury: no major injuries in the last six months.  Other healthcare providers:  Reviewed today .  Social:  Lives with spouse married. No pets.   Preventive parameters: up-to-date  Reviewed   ADLS:   There are no problems or need for assistance  driving, feeding, obtaining food, dressing, toileting and bathing, managing money using phone. She is independent.  EXERCISE/ HABITS  Per week   walking No tobacco    etoh one a day or less no tobacco   ROS:  GEN/ HEENT: No fever, significant weight changes sweats headaches vision problems hearing changes, CV/ PULM; No chest pain shortness of breath cough,  syncope,edema  change in exercise tolerance. GI /GU: No adominal pain, vomiting, change in bowel habits. No blood in the stool. No significant GU symptoms. SKIN/HEME: ,no acute skin rashes suspicious lesions or bleeding. No lymphadenopathy, nodules, masses.  NEURO/ PSYCH:  No neurologic signs such as weakness numbness. No depression anxiety. IMM/ Allergy: No unusual infections.  Allergy .   REST of 12 system review negative except as per HPI   Past Medical History  Diagnosis Date  . Hyperlipidemia   . Hx of colonic polyps   . H/O echocardiogram 02-2000  . Cyst 07-2008    Cyst on back x2 that were drained    Family History  Problem Relation Age of Onset  . Hypertension Mother   . Parkinsonism Mother   . Angina Father   . Heart disease Father   . Colon cancer Father   . Lung cancer Father   . Skin cancer Father   . Cancer Father     Heart  . Breast cancer Sister     over 89  . Diabetes Sister   . Heart disease Brother 73    triple bypass surgery  . Macular degeneration Sister   . Diabetes Sister     prediabetic  . Hearing loss Sister     hearing problems  . Vasculitis Sister     History   Social History  . Marital Status: Married    Spouse Name: N/A    Number of Children:  N/A  . Years of Education: N/A   Occupational History  . retired     Social research officer, government [name]   Social History Main Topics  . Smoking status: Never Smoker   . Smokeless tobacco: None  . Alcohol Use: Yes  . Drug Use: Yes  . Sexually Active: None   Other Topics Concern  . None   Social History Narrative   Regular exercise-yes   Moved back to [place] from [place]   Hh of 2 cat   G3 P2    Exercises walking regu;arly    Retired  Social research officer, government         Past Surgical History  Procedure Laterality Date  . Tubal ligation    . Tonsillectomy    . Polypectomy      Colon     Outpatient Encounter Prescriptions as of 07/22/2012  Medication Sig Dispense Refill  . [DISCONTINUED]  ALPRAZolam (XANAX) 0.25 MG tablet Take 0.25 mg by mouth. Take one tablet by mouth as needed pre flight.       No facility-administered encounter medications on file as of 07/22/2012.    EXAM:  BP 160/80  Pulse 100  Temp(Src) 97.6 F (36.4 C) (Oral)  Ht 5' 1.25" (1.556 m)  Wt 150 lb (68.04 kg)  BMI 28.1 kg/m2  SpO2 98%  Body mass index is 28.1 kg/(m^2).  Physical Exam: Vital signs reviewed ZOX:WRUE is a well-developed well-nourished alert cooperative   who appears stated age in no acute distress.  HEENT: normocephalic atraumatic , Eyes: PERRL EOM's full, conjunctiva clear, Nares: paten,t no deformity discharge or tenderness., Ears: no deformity EAC's clear TMs with normal landmarks. Mouth: clear OP, no lesions, edema.  Moist mucous membranes. Dentition in adequate repair. NECK: supple without masses, thyromegaly or bruits. CHEST/PULM:  Clear to auscultation and percussion breath sounds equal no wheeze , rales or rhonchi. No chest wall deformities or tenderness. Breast: normal by inspection . No dimpling, discharge, masses, tenderness or discharge . CV: PMI is nondisplaced, S1 S2 no gallops, murmurs, rubs. Peripheral pulses are full without delay.No JVD .  ABDOMEN: Bowel sounds normal nontender  No guard or rebound, no hepato splenomegal no CVA tenderness.  No hernia. Extremtities:  No clubbing cyanosis or edema, no acute joint swelling or redness no focal atrophy NEURO:  Oriented x3, cranial nerves 3-12 appear to be intact, no obvious focal weakness,gait within normal limits no abnormal reflexes or asymmetrical SKIN: No acute rashes normal turgor, color, no bruising or petechiae. PSYCH: Oriented, good eye contact, no obvious depression anxiety, cognition and judgment appear normal. LN: no cervical axillary inguinal adenopathy No noted deficits in memory, attention, and speech.   Lab Results  Component Value Date   WBC 7.0 07/22/2012   HGB 15.3* 07/22/2012   HCT 44.7 07/22/2012    PLT 285.0 07/22/2012   GLUCOSE 78 07/22/2012   CHOL 284* 07/22/2012   TRIG 77.0 07/22/2012   HDL 70.00 07/22/2012   LDLDIRECT 191.8 07/22/2012   ALT 22 07/22/2012   AST 27 07/22/2012   NA 137 07/22/2012   K 3.8 07/22/2012   CL 104 07/22/2012   CREATININE 1.0 07/22/2012   BUN 14 07/22/2012   CO2 25 07/22/2012   TSH 0.86 07/22/2012    ASSESSMENT AND PLAN:  Discussed the following assessment and plan:  Medicare annual wellness visit, subsequent - Discussed wellness prevention options risk-benefit - Plan: Basic metabolic panel, Hepatic function panel, Lipid panel, TSH, CBC with Differential  HYPERLIPIDEMIA - Avoiding medication LDL was  quite high in the past may meet criteria for treatment options discussed - Plan: Basic metabolic panel, Hepatic function panel, Lipid panel, TSH, CBC with Differential  Elevated blood pressure reading without diagnosis of hypertension - Plan: Basic metabolic panel, Hepatic function panel, Lipid panel, TSH, CBC with Differential  ARTHRITIS - Plan: Basic metabolic panel, Hepatic function panel, Lipid panel, TSH, CBC with Differential  Right anterior knee pain - Not truly typical of DJD not to worry some discussed options exercises if continuing consider sports medicine. Vitamin d   Lipids guidelines discussed  WC ht noted  Counseled regarding healthy nutrition, exercise, sleep, injury prevention, calcium vit d and healthy weight . Declines reg mammogram  And no statins at this time . Patient Care Team: [name], MD as PCP - General Florencia Reasons, MD (Gastroenterology) Vincenza Hews, MD (Ophthalmology) Noralee Stain, MD as Attending Physician (Dermatology)  Patient Instructions  Continue lifestyle intervention healthy eating and exercise . Check bp readings  To ensure they are at goal.  Sharilyn Sites diet is still healthy   Try knee exercises   If needed we can see sports medicine Knee Exercises EXERCISES RANGE OF MOTION(ROM) AND STRETCHING  EXERCISES These exercises may help you when beginning to rehabilitate your injury. Your symptoms may resolve with or without further involvement from your physician, physical therapist or athletic trainer. While completing these exercises, remember:   Restoring tissue flexibility helps normal motion to return to the joints. This allows healthier, less painful movement and activity.  An effective stretch should be held for at least 30 seconds.  A stretch should never be painful. You should only feel a gentle lengthening or release in the stretched tissue. STRETCH - Knee Extension, Prone  Lie on your stomach on a firm surface, such as a bed or countertop. Place your right / left knee and leg just beyond the edge of the surface. You may wish to place a towel under the far end of your right / left thigh for comfort.  Relax your leg muscles and allow gravity to straighten your knee. Your clinician may advise you to add an ankle weight if more resistance is helpful for you.  You should feel a stretch in the back of your right / left knee. Hold this position for __________ seconds. Repeat __________ times. Complete this stretch __________ times per day. * Your physician, physical therapist or athletic trainer may ask you to add ankle weight to enhance your stretch.  RANGE OF MOTION - Knee Flexion, Active  Lie on your back with both knees straight. (If this causes back discomfort, bend your opposite knee, placing your foot flat on the floor.)  Slowly slide your heel back toward your buttocks until you feel a gentle stretch in the front of your knee or thigh.  Hold for __________ seconds. Slowly slide your heel back to the starting position. Repeat __________ times. Complete this exercise __________ times per day.  STRETCH - Quadriceps, Prone   Lie on your stomach on a firm surface, such as a bed or padded floor.  Bend your right / left knee and grasp your ankle. If you are unable to reach, your  ankle or pant leg, use a belt around your foot to lengthen your reach.  Gently pull your heel toward your buttocks. Your knee should not slide out to the side. You should feel a stretch in the front of your thigh and/or knee.  Hold this position for __________ seconds. Repeat __________  times. Complete this stretch __________ times per day.  STRETCH  Hamstrings, Supine   Lie on your back. Loop a belt or towel over the ball of your right / left foot.  Straighten your right / left knee and slowly pull on the belt to raise your leg. Do not allow the right / left knee to bend. Keep your opposite leg flat on the floor.  Raise the leg until you feel a gentle stretch behind your right / left knee or thigh. Hold this position for __________ seconds. Repeat __________ times. Complete this stretch __________ times per day.  STRENGTHENING EXERCISES These exercises may help you when beginning to rehabilitate your injury. They may resolve your symptoms with or without further involvement from your physician, physical therapist or athletic trainer. While completing these exercises, remember:   Muscles can gain both the endurance and the strength needed for everyday activities through controlled exercises.  Complete these exercises as instructed by your physician, physical therapist or athletic trainer. Progress the resistance and repetitions only as guided.  You may experience muscle soreness or fatigue, but the pain or discomfort you are trying to eliminate should never worsen during these exercises. If this pain does worsen, stop and make certain you are following the directions exactly. If the pain is still present after adjustments, discontinue the exercise until you can discuss the trouble with your clinician. STRENGTH - Quadriceps, Isometrics  Lie on your back with your right / left leg extended and your opposite knee bent.  Gradually tense the muscles in the front of your right / left thigh. You  should see either your knee cap slide up toward your hip or increased dimpling just above the knee. This motion will push the back of the knee down toward the floor/mat/bed on which you are lying.  Hold the muscle as tight as you can without increasing your pain for __________ seconds.  Relax the muscles slowly and completely in between each repetition. Repeat __________ times. Complete this exercise __________ times per day.  STRENGTH - Quadriceps, Short Arcs   Lie on your back. Place a __________ inch towel roll under your knee so that the knee slightly bends.  Raise only your lower leg by tightening the muscles in the front of your thigh. Do not allow your thigh to rise.  Hold this position for __________ seconds. Repeat __________ times. Complete this exercise __________ times per day.  OPTIONAL ANKLE WEIGHTS: Begin with ____________________, but DO NOT exceed ____________________. Increase in 1 pound/0.5 kilogram increments.  STRENGTH - Quadriceps, Straight Leg Raises  Quality counts! Watch for signs that the quadriceps muscle is working to insure you are strengthening the correct muscles and not "cheating" by substituting with healthier muscles.  Lay on your back with your right / left leg extended and your opposite knee bent.  Tense the muscles in the front of your right / left thigh. You should see either your knee cap slide up or increased dimpling just above the knee. Your thigh may even quiver.  Tighten these muscles even more and raise your leg 4 to 6 inches off the floor. Hold for __________ seconds.  Keeping these muscles tense, lower your leg.  Relax the muscles slowly and completely in between each repetition. Repeat __________ times. Complete this exercise __________ times per day.  STRENGTH - Hamstring, Curls  Lay on your stomach with your legs extended. (If you lay on a bed, your feet may hang over the edge.)  Tighten the muscles  in the back of your thigh to bend  your right / left knee up to 90 degrees. Keep your hips flat on the bed/floor.  Hold this position for __________ seconds.  Slowly lower your leg back to the starting position. Repeat __________ times. Complete this exercise __________ times per day.  OPTIONAL ANKLE WEIGHTS: Begin with ____________________, but DO NOT exceed ____________________. Increase in 1 pound/0.5 kilogram increments.  STRENGTH  Quadriceps, Squats  Stand in a door frame so that your feet and knees are in line with the frame.  Use your hands for balance, not support, on the frame.  Slowly lower your weight, bending at the hips and knees. Keep your lower legs upright so that they are parallel with the door frame. Squat only within the range that does not increase your knee pain. Never let your hips drop below your knees.  Slowly return upright, pushing with your legs, not pulling with your hands. Repeat __________ times. Complete this exercise __________ times per day.  STRENGTH - Quadriceps, Wall Slides  Follow guidelines for form closely. Increased knee pain often results from poorly placed feet or knees.  Lean against a smooth wall or door and walk your feet out 18-24 inches. Place your feet hip-width apart.  Slowly slide down the wall or door until your knees bend __________ degrees.* Keep your knees over your heels, not your toes, and in line with your hips, not falling to either side.  Hold for __________ seconds. Stand up to rest for __________ seconds in between each repetition. Repeat __________ times. Complete this exercise __________ times per day. * Your physician, physical therapist or athletic trainer will alter this angle based on your symptoms and progress. Document Released: 01/07/2005 Document Revised: 05/18/2011 Document Reviewed: 06/07/2008 [facility] Patient Information 2013 [place], [place].    DASH Diet The DASH diet stands for "Dietary Approaches to Stop Hypertension." It is a healthy eating  plan that has been shown to reduce high blood pressure (hypertension) in as little as 14 days, while also possibly providing other significant health benefits. These other health benefits include reducing the risk of breast cancer after menopause and reducing the risk of type 2 diabetes, heart disease, colon cancer, and stroke. Health benefits also include weight loss and slowing kidney failure in patients with chronic kidney disease.  DIET GUIDELINES  Limit salt (sodium). Your diet should contain less than 1500 mg of sodium daily.  Limit refined or processed carbohydrates. Your diet should include mostly whole grains. Desserts and added sugars should be used sparingly.  Include small amounts of heart-healthy fats. These types of fats include nuts, oils, and tub margarine. Limit saturated and trans fats. These fats have been shown to be harmful in the body. CHOOSING FOODS  The following food groups are based on a 2000 calorie diet. See your Registered Dietitian for individual calorie needs. Grains and Grain Products (6 to 8 servings daily)  Eat More Often: Whole-wheat bread, brown rice, whole-grain or wheat pasta, quinoa, popcorn without added fat or salt (air popped).  Eat Less Often: White bread, white pasta, white rice, cornbread. Vegetables (4 to 5 servings daily)  Eat More Often: Fresh, frozen, and canned vegetables. Vegetables may be raw, steamed, roasted, or grilled with a minimal amount of fat.  Eat Less Often/Avoid: Creamed or fried vegetables. Vegetables in a cheese sauce. Fruit (4 to 5 servings daily)  Eat More Often: All fresh, canned (in natural juice), or frozen fruits. Dried fruits without added sugar. One hundred  percent fruit juice ( cup [237 mL] daily).  Eat Less Often: Dried fruits with added sugar. Canned fruit in light or heavy syrup. [employer], Fish, and Poultry (2 servings or less daily. One serving is 3 to 4 oz [85-114 g]).  Eat More Often: Ninety percent or  leaner ground beef, tenderloin, sirloin. Round cuts of beef, chicken breast, [place] breast. All fish. Grill, bake, or broil your meat. Nothing should be fried.  Eat Less Often/Avoid: Fatty cuts of meat, [place], or chicken leg, thigh, or wing. Fried cuts of meat or fish. Dairy (2 to 3 servings)  Eat More Often: Low-fat or fat-free milk, low-fat plain or light yogurt, reduced-fat or part-skim cheese.  Eat Less Often/Avoid: Milk (whole, 2%).Whole milk yogurt. Full-fat cheeses. Nuts, Seeds, and Legumes (4 to 5 servings per week)  Eat More Often: All without added salt.  Eat Less Often/Avoid: Salted nuts and seeds, canned beans with added salt. Fats and Sweets (limited)  Eat More Often: Vegetable oils, tub margarines without trans fats, sugar-free gelatin. Mayonnaise and salad dressings.  Eat Less Often/Avoid: Coconut oils, palm oils, butter, stick margarine, cream, half and half, cookies, candy, pie. FOR MORE INFORMATION The Dash Diet Eating Plan: www.dashdiet.org Document Released: 02/12/2011 Document Revised: 05/18/2011 Document Reviewed: 02/12/2011 [facility] Patient Information 2013 [place], [place].     Neta Mends. Panosh M.D.  Health Maintenance  Topic Date Due  . Influenza Vaccine  11/07/2012  . Colonoscopy  02/11/2016  . Tetanus/tdap  07/08/2018  . Pneumococcal Polysaccharide Vaccine Age 75 And Over  Completed  . Zostavax  Completed   Health Maintenance Review

## 2012-07-22 NOTE — Patient Instructions (Addendum)
Continue lifestyle intervention healthy eating and exercise . Check bp readings  To ensure they are at goal.  Pamela Alvarez diet is still healthy   Try knee exercises   If needed we can see sports medicine Knee Exercises EXERCISES RANGE OF MOTION(ROM) AND STRETCHING EXERCISES These exercises may help you when beginning to rehabilitate your injury. Your symptoms may resolve with or without further involvement from your physician, physical therapist or athletic trainer. While completing these exercises, remember:   Restoring tissue flexibility helps normal motion to return to the joints. This allows healthier, less painful movement and activity.  An effective stretch should be held for at least 30 seconds.  A stretch should never be painful. You should only feel a gentle lengthening or release in the stretched tissue. STRETCH - Knee Extension, Prone  Lie on your stomach on a firm surface, such as a bed or countertop. Place your right / left knee and leg just beyond the edge of the surface. You may wish to place a towel under the far end of your right / left thigh for comfort.  Relax your leg muscles and allow gravity to straighten your knee. Your clinician may advise you to add an ankle weight if more resistance is helpful for you.  You should feel a stretch in the back of your right / left knee. Hold this position for __________ seconds. Repeat __________ times. Complete this stretch __________ times per day. * Your physician, physical therapist or athletic trainer may ask you to add ankle weight to enhance your stretch.  RANGE OF MOTION - Knee Flexion, Active  Lie on your back with both knees straight. (If this causes back discomfort, bend your opposite knee, placing your foot flat on the floor.)  Slowly slide your heel back toward your buttocks until you feel a gentle stretch in the front of your knee or thigh.  Hold for __________ seconds. Slowly slide your heel back to the starting  position. Repeat __________ times. Complete this exercise __________ times per day.  STRETCH - Quadriceps, Prone   Lie on your stomach on a firm surface, such as a bed or padded floor.  Bend your right / left knee and grasp your ankle. If you are unable to reach, your ankle or pant leg, use a belt around your foot to lengthen your reach.  Gently pull your heel toward your buttocks. Your knee should not slide out to the side. You should feel a stretch in the front of your thigh and/or knee.  Hold this position for __________ seconds. Repeat __________ times. Complete this stretch __________ times per day.  STRETCH  Hamstrings, Supine   Lie on your back. Loop a belt or towel over the ball of your right / left foot.  Straighten your right / left knee and slowly pull on the belt to raise your leg. Do not allow the right / left knee to bend. Keep your opposite leg flat on the floor.  Raise the leg until you feel a gentle stretch behind your right / left knee or thigh. Hold this position for __________ seconds. Repeat __________ times. Complete this stretch __________ times per day.  STRENGTHENING EXERCISES These exercises may help you when beginning to rehabilitate your injury. They may resolve your symptoms with or without further involvement from your physician, physical therapist or athletic trainer. While completing these exercises, remember:   Muscles can gain both the endurance and the strength needed for everyday activities through controlled exercises.  Complete these  exercises as instructed by your physician, physical therapist or athletic trainer. Progress the resistance and repetitions only as guided.  You may experience muscle soreness or fatigue, but the pain or discomfort you are trying to eliminate should never worsen during these exercises. If this pain does worsen, stop and make certain you are following the directions exactly. If the pain is still present after adjustments,  discontinue the exercise until you can discuss the trouble with your clinician. STRENGTH - Quadriceps, Isometrics  Lie on your back with your right / left leg extended and your opposite knee bent.  Gradually tense the muscles in the front of your right / left thigh. You should see either your knee cap slide up toward your hip or increased dimpling just above the knee. This motion will push the back of the knee down toward the floor/mat/bed on which you are lying.  Hold the muscle as tight as you can without increasing your pain for __________ seconds.  Relax the muscles slowly and completely in between each repetition. Repeat __________ times. Complete this exercise __________ times per day.  STRENGTH - Quadriceps, Short Arcs   Lie on your back. Place a __________ inch towel roll under your knee so that the knee slightly bends.  Raise only your lower leg by tightening the muscles in the front of your thigh. Do not allow your thigh to rise.  Hold this position for __________ seconds. Repeat __________ times. Complete this exercise __________ times per day.  OPTIONAL ANKLE WEIGHTS: Begin with ____________________, but DO NOT exceed ____________________. Increase in 1 pound/0.5 kilogram increments.  STRENGTH - Quadriceps, Straight Leg Raises  Quality counts! Watch for signs that the quadriceps muscle is working to insure you are strengthening the correct muscles and not "cheating" by substituting with healthier muscles.  Lay on your back with your right / left leg extended and your opposite knee bent.  Tense the muscles in the front of your right / left thigh. You should see either your knee cap slide up or increased dimpling just above the knee. Your thigh may even quiver.  Tighten these muscles even more and raise your leg 4 to 6 inches off the floor. Hold for __________ seconds.  Keeping these muscles tense, lower your leg.  Relax the muscles slowly and completely in between each  repetition. Repeat __________ times. Complete this exercise __________ times per day.  STRENGTH - Hamstring, Curls  Lay on your stomach with your legs extended. (If you lay on a bed, your feet may hang over the edge.)  Tighten the muscles in the back of your thigh to bend your right / left knee up to 90 degrees. Keep your hips flat on the bed/floor.  Hold this position for __________ seconds.  Slowly lower your leg back to the starting position. Repeat __________ times. Complete this exercise __________ times per day.  OPTIONAL ANKLE WEIGHTS: Begin with ____________________, but DO NOT exceed ____________________. Increase in 1 pound/0.5 kilogram increments.  STRENGTH  Quadriceps, Squats  Stand in a door frame so that your feet and knees are in line with the frame.  Use your hands for balance, not support, on the frame.  Slowly lower your weight, bending at the hips and knees. Keep your lower legs upright so that they are parallel with the door frame. Squat only within the range that does not increase your knee pain. Never let your hips drop below your knees.  Slowly return upright, pushing with your legs, not pulling with  your hands. Repeat __________ times. Complete this exercise __________ times per day.  STRENGTH - Quadriceps, Wall Slides  Follow guidelines for form closely. Increased knee pain often results from poorly placed feet or knees.  Lean against a smooth wall or door and walk your feet out 18-24 inches. Place your feet hip-width apart.  Slowly slide down the wall or door until your knees bend __________ degrees.* Keep your knees over your heels, not your toes, and in line with your hips, not falling to either side.  Hold for __________ seconds. Stand up to rest for __________ seconds in between each repetition. Repeat __________ times. Complete this exercise __________ times per day. * Your physician, physical therapist or athletic trainer will alter this angle based on  your symptoms and progress. Document Released: 01/07/2005 Document Revised: 05/18/2011 Document Reviewed: 06/07/2008 [facility] Patient Information 2013 [place], [place].    DASH Diet The DASH diet stands for "Dietary Approaches to Stop Hypertension." It is a healthy eating plan that has been shown to reduce high blood pressure (hypertension) in as little as 14 days, while also possibly providing other significant health benefits. These other health benefits include reducing the risk of breast cancer after menopause and reducing the risk of type 2 diabetes, heart disease, colon cancer, and stroke. Health benefits also include weight loss and slowing kidney failure in patients with chronic kidney disease.  DIET GUIDELINES  Limit salt (sodium). Your diet should contain less than 1500 mg of sodium daily.  Limit refined or processed carbohydrates. Your diet should include mostly whole grains. Desserts and added sugars should be used sparingly.  Include small amounts of heart-healthy fats. These types of fats include nuts, oils, and tub margarine. Limit saturated and trans fats. These fats have been shown to be harmful in the body. CHOOSING FOODS  The following food groups are based on a 2000 calorie diet. See your Registered Dietitian for individual calorie needs. Grains and Grain Products (6 to 8 servings daily)  Eat More Often: Whole-wheat bread, brown rice, whole-grain or wheat pasta, quinoa, popcorn without added fat or salt (air popped).  Eat Less Often: White bread, white pasta, white rice, cornbread. Vegetables (4 to 5 servings daily)  Eat More Often: Fresh, frozen, and canned vegetables. Vegetables may be raw, steamed, roasted, or grilled with a minimal amount of fat.  Eat Less Often/Avoid: Creamed or fried vegetables. Vegetables in a cheese sauce. Fruit (4 to 5 servings daily)  Eat More Often: All fresh, canned (in natural juice), or frozen fruits. Dried fruits without added sugar. One  hundred percent fruit juice ( cup [237 mL] daily).  Eat Less Often: Dried fruits with added sugar. Canned fruit in light or heavy syrup. [employer], Fish, and Poultry (2 servings or less daily. One serving is 3 to 4 oz [85-114 g]).  Eat More Often: Ninety percent or leaner ground beef, tenderloin, sirloin. Round cuts of beef, chicken breast, [place] breast. All fish. Grill, bake, or broil your meat. Nothing should be fried.  Eat Less Often/Avoid: Fatty cuts of meat, [place], or chicken leg, thigh, or wing. Fried cuts of meat or fish. Dairy (2 to 3 servings)  Eat More Often: Low-fat or fat-free milk, low-fat plain or light yogurt, reduced-fat or part-skim cheese.  Eat Less Often/Avoid: Milk (whole, 2%).Whole milk yogurt. Full-fat cheeses. Nuts, Seeds, and Legumes (4 to 5 servings per week)  Eat More Often: All without added salt.  Eat Less Often/Avoid: Salted nuts and seeds, canned beans with added  salt. Fats and Sweets (limited)  Eat More Often: Vegetable oils, tub margarines without trans fats, sugar-free gelatin. Mayonnaise and salad dressings.  Eat Less Often/Avoid: Coconut oils, palm oils, butter, stick margarine, cream, half and half, cookies, candy, pie. FOR MORE INFORMATION The Dash Diet Eating Plan: www.dashdiet.org Document Released: 02/12/2011 Document Revised: 05/18/2011 Document Reviewed: 02/12/2011 [facility] Patient Information 2013 [place], [place].

## 2012-08-03 NOTE — Progress Notes (Signed)
Quick Note:  Called and spoke with pt and pt is aware. Pt would like to know which medication will be sent to pharmacy ([facility]) and pt would like to know if it will be the lowest dosage. Pls advise. ______

## 2012-08-11 ENCOUNTER — Encounter: Payer: Self-pay | Admitting: Internal Medicine

## 2012-11-16 ENCOUNTER — Ambulatory Visit (INDEPENDENT_AMBULATORY_CARE_PROVIDER_SITE_OTHER): Payer: Medicare Other

## 2012-11-16 DIAGNOSIS — Z23 Encounter for immunization: Secondary | ICD-10-CM

## 2013-07-24 ENCOUNTER — Ambulatory Visit (INDEPENDENT_AMBULATORY_CARE_PROVIDER_SITE_OTHER): Payer: Medicare Other | Admitting: Internal Medicine

## 2013-07-24 ENCOUNTER — Encounter: Payer: Self-pay | Admitting: Internal Medicine

## 2013-07-24 VITALS — BP 164/76 | HR 99 | Temp 97.5°F | Ht 61.25 in | Wt 152.0 lb

## 2013-07-24 DIAGNOSIS — R030 Elevated blood-pressure reading, without diagnosis of hypertension: Secondary | ICD-10-CM

## 2013-07-24 DIAGNOSIS — Z0000 Encounter for general adult medical examination without abnormal findings: Secondary | ICD-10-CM

## 2013-07-24 DIAGNOSIS — Z23 Encounter for immunization: Secondary | ICD-10-CM

## 2013-07-24 DIAGNOSIS — M129 Arthropathy, unspecified: Secondary | ICD-10-CM

## 2013-07-24 DIAGNOSIS — E785 Hyperlipidemia, unspecified: Secondary | ICD-10-CM

## 2013-07-24 LAB — CBC WITH DIFFERENTIAL/PLATELET
BASOS PCT: 0.8 % (ref 0.0–3.0)
Basophils Absolute: 0.1 10*3/uL (ref 0.0–0.1)
EOS ABS: 0.3 10*3/uL (ref 0.0–0.7)
EOS PCT: 3.9 % (ref 0.0–5.0)
HCT: 44.6 % (ref 36.0–46.0)
Hemoglobin: 15 g/dL (ref 12.0–15.0)
LYMPHS PCT: 31 % (ref 12.0–46.0)
Lymphs Abs: 2.4 10*3/uL (ref 0.7–4.0)
MCHC: 33.6 g/dL (ref 30.0–36.0)
MCV: 93.9 fl (ref 78.0–100.0)
Monocytes Absolute: 0.7 10*3/uL (ref 0.1–1.0)
Monocytes Relative: 8.8 % (ref 3.0–12.0)
NEUTROS PCT: 55.5 % (ref 43.0–77.0)
Neutro Abs: 4.3 10*3/uL (ref 1.4–7.7)
PLATELETS: 271 10*3/uL (ref 150.0–400.0)
RBC: 4.75 Mil/uL (ref 3.87–5.11)
RDW: 13.8 % (ref 11.5–15.5)
WBC: 7.8 10*3/uL (ref 4.0–10.5)

## 2013-07-24 LAB — LIPID PANEL
CHOL/HDL RATIO: 4
Cholesterol: 269 mg/dL — ABNORMAL HIGH (ref 0–200)
HDL: 67.8 mg/dL (ref >39.00)
LDL CALC: 182 mg/dL — AB (ref 0–99)
Triglycerides: 95 mg/dL (ref 0.0–149.0)
VLDL: 19 mg/dL (ref 0.0–40.0)

## 2013-07-24 LAB — BASIC METABOLIC PANEL
BUN: 16 mg/dL (ref 6–23)
CALCIUM: 9.3 mg/dL (ref 8.4–10.5)
CO2: 26 mEq/L (ref 19–32)
CREATININE: 1 mg/dL (ref 0.4–1.2)
Chloride: 107 mEq/L (ref 96–112)
GFR: 59.73 mL/min — ABNORMAL LOW (ref >60.00)
Glucose, Bld: 92 mg/dL (ref 70–99)
Potassium: 4.2 mEq/L (ref 3.5–5.1)
Sodium: 140 mEq/L (ref 135–145)

## 2013-07-24 LAB — HEPATIC FUNCTION PANEL
ALK PHOS: 45 U/L (ref 39–117)
ALT: 19 U/L (ref 0–35)
AST: 28 U/L (ref 0–37)
Albumin: 3.9 g/dL (ref 3.5–5.2)
BILIRUBIN DIRECT: 0 mg/dL (ref 0.0–0.3)
BILIRUBIN TOTAL: 0.8 mg/dL (ref 0.2–1.2)
TOTAL PROTEIN: 7.1 g/dL (ref 6.0–8.3)

## 2013-07-24 LAB — TSH: TSH: 1.47 u[IU]/mL (ref 0.35–4.50)

## 2013-07-24 LAB — CK: Total CK: 98 U/L (ref 7–177)

## 2013-07-24 NOTE — Progress Notes (Signed)
Chief Complaint  Patient presents with  . Medicare Wellness    HPI: Patient comes in today for Preventive Medicare wellness visit . No major injuries, ed visits ,hospitalizations , new medications since last visit.   joiunt problems comes and goes and right knee. .  No bone density.Chosen to .  not get dexa.   sisterws on medication :  For lipids one has diabetics.   onse son has beneon meds   Never got ?s answered about last levles and interventions   Health Maintenance  Topic Date Due  . Mammogram  03/10/2007  . Influenza Vaccine  10/07/2013  . Colonoscopy  02/11/2016  . Tetanus/tdap  07/08/2018  . Pneumococcal Polysaccharide Vaccine Age 74 And Over  Completed  . Zostavax  Completed   Health Maintenance Review   Hearing:  Ok   Vision:  No limitations at present . Last eye check UTD  Safety:  Has smoke detector and wears seat belts.  No firearms. No excess sun exposure. Sees dentist regularly.  Falls:  No   Advance directive :  Reviewed   Has a will  Memory: Felt to be good  , no concern from her or her family.  Depression: No anhedonia unusual crying or depressive symptoms  Nutrition: Eats well balanced diet; adequate calcium and vitamin D. No swallowing chewing problems.  Injury: no major injuries in the last six months.  Other healthcare providers:  Reviewed today .  Social:  Lives with spouse married. No pets.   Preventive parameters: up-to-date  Reviewed   ADLS:   There are no problems or need for assistance  driving, feeding, obtaining food, dressing, toileting and bathing, managing money using phone. She is independent.  EXERCISE/ HABITS  Per week   pilates 2 x per week  Walking  No tobacco    etoh 1 per day  No sugar beverages  Sleep ok   ROS:  GEN/ HEENT: No fever, significant weight changes sweats headaches vision problems hearing changes, CV/ PULM; No chest pain shortness of breath cough, syncope,edema  change in exercise tolerance. GI /GU:  No adominal pain, vomiting, change in bowel habits. No blood in the stool. No significant GU symptoms. SKIN/HEME: ,no acute skin rashes suspicious lesions or bleeding. No lymphadenopathy, nodules, masses.  NEURO/ PSYCH:  No neurologic signs such as weakness numbness. No depression anxiety. IMM/ Allergy: No unusual infections.  Allergy .   REST of 12 system review negative except as per HPI   Past Medical History  Diagnosis Date  . Hyperlipidemia   . Hx of colonic polyps   . H/O echocardiogram 02-2000  . Cyst 07-2008    Cyst on back x2 that were drained   Past Surgical History  Procedure Laterality Date  . Tubal ligation    . Tonsillectomy    . Polypectomy      Colon     Family History  Problem Relation Age of Onset  . Hypertension Mother   . Parkinsonism Mother   . Angina Father   . Heart disease Father   . Colon cancer Father   . Lung cancer Father   . Skin cancer Father   . Cancer Father     Heart  . Breast cancer Sister     over [id number]  . Diabetes Sister   . Heart disease Brother [id number]    triple bypass surgery  . Macular degeneration Sister   . Diabetes Sister     prediabetic  . Hearing loss Sister  hearing problems  . Vasculitis Sister     History   Social History  . Marital Status: Married    Spouse Name: N/A    Number of Children: N/A  . Years of Education: N/A   Occupational History  . retired     Social research officer, governmentchool psychologist [name]   Social History Main Topics  . Smoking status: Never Smoker   . Smokeless tobacco: None  . Alcohol Use: Yes  . Drug Use: Yes  . Sexual Activity: None   Other Topics Concern  . None   Social History Narrative   Regular exercise-yes   [place]oved back to [place]   Hh of 2 cat   G3 P2    Exercises walking regu;arly    Retired  Social research officer, governmentchool psychologist          No outpatient encounter prescriptions on file as of 07/24/2013.    EXAM:  BP 164/76  Pulse 99  Temp(Src) 97.5 F (36.4 C) (Oral)  Ht 5' 1.25" (1.556 m)  Wt  152 lb (68.947 kg)  BMI 28.48 kg/m2  SpO2 98%  Body mass index is 28.48 kg/(m^2).  Physical Exam: Vital signs reviewed HQI:ONGEGEN:This is a well-developed well-nourished alert cooperative   who appears stated age in no acute distress.  HEENT: normocephalic atraumatic , Eyes: PERRL EOM's full, conjunctiva clear, Nares: paten,t no deformity discharge or tenderness., Ears: no deformity EAC's clear TMs with normal landmarks. Mouth: clear OP, no lesions, edema.  Moist mucous membranes. Dentition in adequate repair. NECK: supple without masses, thyromegaly or bruits. CHEST/PULM:  Clear to auscultation and percussion breath sounds equal no wheeze , rales or rhonchi. No chest wall deformities or tenderness. Breast: normal by inspection . No dimpling, discharge, masses, tenderness or discharge . CV: PMI is nondisplaced, S1 S2 no gallops, murmurs, rubs. Peripheral pulses are full without delay.No JVD .  ABDOMEN: Bowel sounds normal nontender  No guard or rebound, no hepato splenomegal no CVA tenderness.  No hernia. Extremtities:  No clubbing cyanosis or edema, no acute joint swelling or redness no focal atrophy OA changes  NEURO:  Oriented x3, cranial nerves 3-12 appear to be intact, no obvious focal weakness,gait within normal limits no abnormal reflexes or asymmetrical SKIN: No acute rashes normal turgor, color, no bruising or petechiae. PSYCH: Oriented, good eye contact, no obvious depression anxiety, cognition and judgment appear normal. LN: no cervical axillary inguinal adenopathy No noted deficits in memory, attention, and speech.  ASSESSMENT AND PLAN:  Discussed the following assessment and plan:  Medicare annual wellness visit, subsequent - Plan: Basic metabolic panel, CBC with Differential, Hepatic function panel, Lipid panel, TSH  HYPERLIPIDEMIA - has been at high levels family hx recheck reviewed statin meds etc - Plan: Basic metabolic panel, CBC with Differential, Hepatic function panel, Lipid  panel, TSH, CK  Need for vaccination with 13-polyvalent pneumococcal conjugate vaccine - Plan: Pneumococcal conjugate vaccine 13-valent  Elevated blood pressure reading without diagnosis of hypertension - may need med sintervention disc fu and poss rx with ace etc - Plan: Basic metabolic panel, CBC with Differential, Hepatic function panel, Lipid panel, TSH  Arthropathy, unspecified, site unspecified - Plan: Basic metabolic panel, CBC with Differential, Hepatic function panel, Lipid panel, TSH  Visit for preventive health examination - declines dexa as wouldnt  use med intervention  Counseled regarding healthy nutrition, exercise, sleep, injury prevention, calcium vit d and healthy weight .  Patient Care Team: Madelin HeadingsWanda K Elaysha Bevard, MD as PCP - General Florencia Reasonsobert V Buccini, MD (  Gastroenterology) Vincenza Hews, MD (Ophthalmology) Noralee Stain, MD as Attending Physician (Dermatology)  Patient Instructions  Check Bp readings  2  X twice a day for a week.  2 in am and 2 in pm.   To get 28 readings in a week . Send in by fax phoine e mail to   Decide on medication. Goal is below 140/90 although 140 acceptable.   Consider statin medication  Poss 10 mg of atorvastatin or other ( not crestor) and plan follow up depending on results. Will notify you  of labs when available.   Neta Mends. Ngozi Alvidrez M.D.  Pre visit review using our clinic review tool, if applicable. No additional management support is needed unless otherwise documented below in the visit note.

## 2013-07-24 NOTE — Patient Instructions (Signed)
Check Bp readings  2  X twice a day for a week.  2 in am and 2 in pm.   To get 28 readings in a week . Send in by fax phoine e mail to   Decide on medication. Goal is below 140/90 although 140 acceptable.   Consider statin medication  Poss 10 mg of atorvastatin or other ( not crestor) and plan follow up depending on results. Will notify you  of labs when available.   .Marland Kitchen

## 2013-07-28 DIAGNOSIS — Z0000 Encounter for general adult medical examination without abnormal findings: Secondary | ICD-10-CM | POA: Insufficient documentation

## 2013-08-03 ENCOUNTER — Other Ambulatory Visit: Payer: Self-pay | Admitting: Family Medicine

## 2013-08-03 DIAGNOSIS — E785 Hyperlipidemia, unspecified: Secondary | ICD-10-CM

## 2013-08-03 MED ORDER — ATORVASTATIN CALCIUM 10 MG PO TABS: 10.0000 mg | ORAL_TABLET | Freq: Every day | ORAL | Status: DC

## 2013-11-07 ENCOUNTER — Other Ambulatory Visit: Payer: Medicare Other

## 2013-11-10 ENCOUNTER — Ambulatory Visit (INDEPENDENT_AMBULATORY_CARE_PROVIDER_SITE_OTHER): Payer: Medicare Other | Admitting: Internal Medicine

## 2013-11-10 ENCOUNTER — Encounter: Payer: Self-pay | Admitting: Internal Medicine

## 2013-11-10 VITALS — BP 160/80 | Temp 97.7°F | Ht 61.25 in | Wt 151.0 lb

## 2013-11-10 DIAGNOSIS — R05 Cough: Secondary | ICD-10-CM

## 2013-11-10 DIAGNOSIS — R82998 Other abnormal findings in urine: Secondary | ICD-10-CM

## 2013-11-10 DIAGNOSIS — J329 Chronic sinusitis, unspecified: Secondary | ICD-10-CM

## 2013-11-10 DIAGNOSIS — E785 Hyperlipidemia, unspecified: Secondary | ICD-10-CM

## 2013-11-10 DIAGNOSIS — R0982 Postnasal drip: Secondary | ICD-10-CM | POA: Insufficient documentation

## 2013-11-10 DIAGNOSIS — R030 Elevated blood-pressure reading, without diagnosis of hypertension: Secondary | ICD-10-CM

## 2013-11-10 DIAGNOSIS — R8290 Unspecified abnormal findings in urine: Secondary | ICD-10-CM | POA: Insufficient documentation

## 2013-11-10 DIAGNOSIS — R059 Cough, unspecified: Secondary | ICD-10-CM | POA: Insufficient documentation

## 2013-11-10 LAB — POCT URINALYSIS DIP (MANUAL ENTRY)
Bilirubin, UA: NEGATIVE
Glucose, UA: NEGATIVE
Ketones, POC UA: NEGATIVE
Nitrite, UA: NEGATIVE
Protein Ur, POC: NEGATIVE
Spec Grav, UA: <=1.005
Urobilinogen, UA: 0.2
pH, UA: 6

## 2013-11-10 MED ORDER — LOSARTAN POTASSIUM 50 MG PO TABS
50.0000 mg | ORAL_TABLET | Freq: Every day | ORAL | Status: DC
Start: 2013-11-10 — End: 2014-07-27

## 2013-11-10 NOTE — Patient Instructions (Addendum)
Post t nasal drainage and environmental irritation .most likely cause. Chest is clear at this time. Use nasal steroid every day  For at least 7-10 days to see if helps suppress the drainage and stuffiness. If so continue  .on.  Antihistamine such as zyrtec allegra claritin  Generic. Saline nose spray also .  Cough drops can prolong cough and cause air way irritability  If used regularly . Advise change to sugar free cough drops if needed Urine test is ok minor abnormality. Contact us if having symptoms of UTI.  BP is still elevated  Take blood pressure readings twice a day for 7- 10 days and then periodically  IF readings are  Mostly  140/90   Or above   Begin bp medicatoin  Send in readings  For med to review. Change lab appt  To 2-3 months  On the lipitor

## 2013-11-10 NOTE — Progress Notes (Signed)
Pre visit review using our clinic review tool, if applicable. No additional management support is needed unless otherwise documented below in the visit note.  Chief Complaint  Patient presents with  . Cough  . Post Nasal Drip  . Stuffy Nose  . Itchy Eyes    HPI: Patient Pamela Alvarez  comes in today for SDA for  new problem evaluation.   Cough has been going on weeks   husband had  Sinus infection and  Went to [facility] and got antibiotic. And it went away. ? If allergy med part of this.   Water damage in house when came back from trip[place]..  Has tried  mucinex and  Nothing else. And cough drops. jsut bought  Saline nose spray.Cough dry in am and then loosens .  No dface pain and teeth   Pain .    hasnt started  Statin from trip will begin  now  Odor to urine . Without uti sx fever dysuria frequency blood  bp had been ? Ok when checked last bp bu cant find the readings or the machine right now ROS: See pertinent positives and negatives per HPI. No fever chills .   Past Medical History  Diagnosis Date  . Hyperlipidemia   . Hx of colonic polyps   . H/O echocardiogram 02-2000  . Cyst 07-2008    Cyst on back x2 that were drained    Family History  Problem Relation Age of Onset  . Hypertension Mother   . Parkinsonism Mother   . Angina Father   . Heart disease Father   . Colon cancer Father   . Lung cancer Father   . Skin cancer Father   . Cancer Father     Heart  . Breast cancer Sister     over 90  . Diabetes Sister   . Heart disease Brother 78    triple bypass surgery  . Macular degeneration Sister   . Diabetes Sister     prediabetic  . Hearing loss Sister     hearing problems  . Vasculitis Sister     History   Social History  . Marital Status: Married    Spouse Name: N/A    Number of Children: N/A  . Years of Education: N/A   Occupational History  . retired     Social research officer, government [name]   Social History Main Topics  . Smoking status:  Never Smoker   . Smokeless tobacco: None  . Alcohol Use: Yes  . Drug Use: Yes  . Sexual Activity: None   Other Topics Concern  . None   Social History Narrative   Regular exercise-yes   Moved back to [place] from [place]   Hh of 2 cat   G3 P2    Exercises walking regu;arly    Retired  Social research officer, government          Outpatient Encounter Prescriptions as of 11/10/2013  Medication Sig  . atorvastatin (LIPITOR) 10 MG tablet Take 1 tablet (10 mg total) by mouth daily.  Marland Kitchen losartan (COZAAR) 50 MG tablet Take 1 tablet (50 mg total) by mouth daily. For high blood pressure    EXAM:  BP 160/80  Temp(Src) 97.7 F (36.5 C) (Oral)  Ht 5' 1.25" (1.556 m)  Wt 151 lb (68.493 kg)  BMI 28.29 kg/m2  Body mass index is 28.29 kg/(m^2).  GENERAL: vitals reviewed and listed above, alert, oriented, appears well hydrated and in no acute distress mildy congested HEENT:  atraumatic, conjunctiva  clear, no obvious abnormalities on inspection of external nose and ears tm clear nares2+ congested clear mucoid dc face non tender OP : no lesion edema or exudate  NECK: no obvious masses on inspection palpation  LUNGS: clear to auscultation bilaterally, no wheezes, rales or rhonchi, good air movement CV: HRRR, no clubbing cyanosis or  peripheral edema nl cap refill  MS: moves all extremities without noticeable focal  abnormality PSYCH: pleasant and cooperative, no obvious depression or anxiety  ASSESSMENT AND PLAN:  Discussed the following assessment and plan:  Cough  Post-nasal drainage  Elevated blood pressure reading without diagnosis of hypertension - can recheck at home and get [place] info rx given to start if not below 140/90 range rov 1-2 months if bp upand need med  Abnormal urine odor - prob not infection - Plan: POCT urinalysis dipstick, Urine culture  Other and unspecified hyperlipidemia - did start med start and get labs 2-3 months later  -Patient advised to return or notify health care team  if  symptoms worsen ,persist or new concerns arise.  Patient Instructions  Post t nasal drainage and environmental irritation .most likely cause. Chest is clear at this time. Use nasal steroid every day  For at least 7-10 days to see if helps suppress the drainage and stuffiness. If so continue  .on.  Antihistamine such as zyrtec allegra claritin  Generic. Saline nose spray also .  Cough drops can prolong cough and cause air way irritability  If used regularly . Advise change to sugar free cough drops if needed Urine test is ok minor abnormality. Contact us if having symptoms of UTI.  BP is still elevated  Take blood pressure readings twice a day for 7- 10 days and then periodically  IF readings are  Mostly  140/90   Or above   Begin bp medicatoin  Send in readings  For med to review. Change lab appt  To 2-3 months  On the lipitor    Rishard Delange K. Kharson Rasmusson M.D.

## 2013-11-13 LAB — URINE CULTURE: Colony Count: >=100000

## 2013-11-16 ENCOUNTER — Other Ambulatory Visit: Payer: Medicare Other

## 2013-12-07 ENCOUNTER — Encounter: Payer: Self-pay | Admitting: Internal Medicine

## 2013-12-07 ENCOUNTER — Ambulatory Visit (INDEPENDENT_AMBULATORY_CARE_PROVIDER_SITE_OTHER): Payer: Medicare Other | Admitting: Internal Medicine

## 2013-12-07 VITALS — BP 190/80 | Temp 98.1°F | Wt 152.4 lb

## 2013-12-07 DIAGNOSIS — L089 Local infection of the skin and subcutaneous tissue, unspecified: Secondary | ICD-10-CM

## 2013-12-07 DIAGNOSIS — R030 Elevated blood-pressure reading, without diagnosis of hypertension: Secondary | ICD-10-CM

## 2013-12-07 DIAGNOSIS — L723 Sebaceous cyst: Secondary | ICD-10-CM

## 2013-12-07 MED ORDER — CEPHALEXIN 500 MG PO CAPS: 500.0000 mg | ORAL_CAPSULE | Freq: Three times a day (TID) | ORAL | Status: DC

## 2013-12-07 NOTE — Progress Notes (Signed)
Pre visit review using our clinic review tool, if applicable. No additional management support is needed unless otherwise documented below in the visit note.   Chief Complaint  Patient presents with  . Cyst    Rt shoulder.  Ongoing for 6 months.  Is leaking.    HPI: Patient Pamela Alvarez  comes in today for SDA for  new problem evaluation. Has battled some tenderness problem with a cyst on her right shoulder for about 4-6 months. She has a remote history of incision and drainage 6 years ago.  Most recently nevertheless number of days it is increased in size tenderness and she comes in for evaluation they're leaving town soon there is no associated fever slight drainage. ROS: See pertinent positives and negatives per HPI. Has white coat hypertension blood pressure comes up in the office states it is okay out of the office but no confirmation recently.  Past Medical History  Diagnosis Date  . Hyperlipidemia   . Hx of colonic polyps   . H/O echocardiogram 02-2000  . Cyst 07-2008    Cyst on back x2 that were drained    Family History  Problem Relation Age of Onset  . Hypertension Mother   . Parkinsonism Mother   . Angina Father   . Heart disease Father   . Colon cancer Father   . Lung cancer Father   . Skin cancer Father   . Cancer Father     Heart  . Breast cancer Sister     over 34  . Diabetes Sister   . Heart disease Brother 28    triple bypass surgery  . Macular degeneration Sister   . Diabetes Sister     prediabetic  . Hearing loss Sister     hearing problems  . Vasculitis Sister     History   Social History  . Marital Status: Married    Spouse Name: N/A    Number of Children: N/A  . Years of Education: N/A   Occupational History  . retired     Social research officer, government [name]   Social History Main Topics  . Smoking status: Never Smoker   . Smokeless tobacco: None  . Alcohol Use: Yes  . Drug Use: Yes  . Sexual Activity: None   Other Topics Concern  . None    Social History Narrative   Regular exercise-yes   Moved back to [place] from [place]   Hh of 2 cat   G3 P2    Exercises walking regu;arly    Retired  Social research officer, government          Outpatient Encounter Prescriptions as of 12/07/2013  Medication Sig  . atorvastatin (LIPITOR) 10 MG tablet Take 1 tablet (10 mg total) by mouth daily.  . cephALEXin (KEFLEX) 500 MG capsule Take 1 capsule (500 mg total) by mouth 3 (three) times daily.  Marland Kitchen losartan (COZAAR) 50 MG tablet Take 1 tablet (50 mg total) by mouth daily. For high blood pressure    EXAM:  BP 190/80  Temp(Src) 98.1 F (36.7 C) (Oral)  Wt 152 lb 6.4 oz (69.128 kg)  Body mass index is 28.55 kg/(m^2).  GENERAL: vitals reviewed and listed above, alert, oriented, appears well hydrated and in no acute distress HEENT: atraumatic, conjunctiva  clear, no obvious abnormalities on inspection of external nose and ears  Skin over right posterior shoulder there is a 3 cm cystic lesion with induration redness and tenderness. There is a very small leak at the opening for.  After discussion with patient 5-10 minutes spent expressing yellow thick pus and cystic debris. She tolerated this well and induration size is minimal down to less than a centimeter. There were some bloody serous discharge the and of the procedure. Covered with a dry dressing. PSYCH: pleasant and cooperative, no obvious depression or anxiety  ASSESSMENT AND PLAN:  Discussed the following assessment and plan:  Infected sebaceous cyst - manually expressed coious amounts of cyst material and pus  tolerated well induration minimal  bloddy at end dc .   White coat syndrome without hypertension Local care discussion expectant management 5 days antibiotics warm compresses followup for alarm symptoms discussion. -Patient advised to return or notify health care team  if symptoms worsen ,persist or new concerns arise.  Patient Instructions  Warm moist heat 3 x per day to help drainage and  resolution . Antibiotic for 5-7 days until better. Ok to travel.  consdier removal if recurring more frequently.    Pamela Alvarez M.D.

## 2013-12-07 NOTE — Patient Instructions (Signed)
Warm moist heat 3 x per day to help drainage and resolution . Antibiotic for 5-7 days until better. Ok to travel.  consdier removal if recurring more frequently.

## 2013-12-29 ENCOUNTER — Telehealth: Payer: Self-pay | Admitting: Internal Medicine

## 2013-12-29 DIAGNOSIS — L723 Sebaceous cyst: Principal | ICD-10-CM

## 2013-12-29 DIAGNOSIS — L089 Local infection of the skin and subcutaneous tissue, unspecified: Secondary | ICD-10-CM

## 2013-12-29 MED ORDER — DOXYCYCLINE HYCLATE 100 MG PO TABS: 100.0000 mg | ORAL_TABLET | Freq: Two times a day (BID) | ORAL | Status: DC

## 2013-12-29 NOTE — Telephone Encounter (Addendum)
Pt saw dr [name] 10/1.  Pt states her cyst is still draining. Would like to know if she needs to start another an abx. Pt has finished the first one. Pt was on a trip and could not do wet compresses consistently, but is home now and can do this.  What is your recommendation at this point?  Anything else she should do?  Pt has been putting polysporin on it (allergic to sulfer) is that ok too?  [facility]

## 2013-12-29 NOTE — Telephone Encounter (Signed)
Doxycycline 100 1 po bid for 7 days  Will send this in  Please notify patient

## 2013-12-29 NOTE — Telephone Encounter (Signed)
Left message on machine for patient that Rx sent to pharmacy.

## 2013-12-29 NOTE — Telephone Encounter (Signed)
Per Dr [name] and patient should be seen by general surgeon.  Patient states she is not it pain at this point but she would like to try an antibiotic over the weekend and will go to the surgeon.   Patient will be out of town Wednesday.  Noted in referral that was placed.

## 2014-01-19 ENCOUNTER — Other Ambulatory Visit: Payer: Medicare Other

## 2014-01-30 ENCOUNTER — Ambulatory Visit (INDEPENDENT_AMBULATORY_CARE_PROVIDER_SITE_OTHER): Payer: Medicare Other | Admitting: *Deleted

## 2014-01-30 ENCOUNTER — Ambulatory Visit: Payer: Medicare Other

## 2014-01-30 DIAGNOSIS — Z23 Encounter for immunization: Secondary | ICD-10-CM

## 2014-03-15 ENCOUNTER — Other Ambulatory Visit (INDEPENDENT_AMBULATORY_CARE_PROVIDER_SITE_OTHER): Payer: Medicare Other

## 2014-03-15 DIAGNOSIS — E785 Hyperlipidemia, unspecified: Secondary | ICD-10-CM

## 2014-03-15 LAB — LIPID PANEL
CHOL/HDL RATIO: 4
CHOLESTEROL: 267 mg/dL — AB (ref 0–200)
HDL: 74.9 mg/dL (ref >39.00)
LDL CALC: 171 mg/dL — AB (ref 0–99)
NonHDL: 192.1
TRIGLYCERIDES: 104 mg/dL (ref 0.0–149.0)
VLDL: 20.8 mg/dL (ref 0.0–40.0)

## 2014-06-18 ENCOUNTER — Encounter: Payer: Self-pay | Admitting: Internal Medicine

## 2014-06-18 ENCOUNTER — Ambulatory Visit (INDEPENDENT_AMBULATORY_CARE_PROVIDER_SITE_OTHER): Payer: Medicare Other | Admitting: Internal Medicine

## 2014-06-18 VITALS — BP 158/70 | Temp 97.4°F | Ht 61.25 in | Wt 151.5 lb

## 2014-06-18 DIAGNOSIS — R030 Elevated blood-pressure reading, without diagnosis of hypertension: Secondary | ICD-10-CM | POA: Diagnosis not present

## 2014-06-18 DIAGNOSIS — M79672 Pain in left foot: Secondary | ICD-10-CM

## 2014-06-18 NOTE — Progress Notes (Signed)
Pre visit review using our clinic review tool, if applicable. No additional management support is needed unless otherwise documented below in the visit note.  Chief Complaint  Patient presents with  . Foot Pain    Started last Thursday.  Better today.  . Swelling    HPI: Patient Pamela Alvarez  comes in today for SDA for  new problem evaluation. Onset of 4 day ago of left foot pain / if any injury could have had something fall on  it  Walks a lot and after  firt Friday downtown had pain   To walk. Rested over weekend ice an now some better . Still looks swollen. Feels has gait issue a and orthotics could help .   Checking bp at home and reported in range  .didnt start the cozaar  . Began liptor  Seems to have a diuretic effecg according to patient ROS: See pertinent positives and negatives per HPI.  Past Medical History  Diagnosis Date  . Hyperlipidemia   . Hx of colonic polyps   . H/O echocardiogram 02-2000  . Cyst 07-2008    Cyst on back x2 that were drained    Family History  Problem Relation Age of Onset  . Hypertension Mother   . Parkinsonism Mother   . Angina Father   . Heart disease Father   . Colon cancer Father   . Lung cancer Father   . Skin cancer Father   . Cancer Father     Heart  . Breast cancer Sister     over 28  . Diabetes Sister   . Heart disease Brother 51    triple bypass surgery  . Macular degeneration Sister   . Diabetes Sister     prediabetic  . Hearing loss Sister     hearing problems  . Vasculitis Sister     History   Social History  . Marital Status: Married    Spouse Name: N/A  . Number of Children: N/A  . Years of Education: N/A   Occupational History  . retired     Social research officer, government [name]   Social History Main Topics  . Smoking status: Never Smoker   . Smokeless tobacco: Not on file  . Alcohol Use: Yes  . Drug Use: Yes  . Sexual Activity: Not on file   Other Topics Concern  . None   Social History Narrative   Regular  exercise-yes   Moved back to [place] from [place] of 2 cat   G3 P2    Exercises walking regu;arly    Retired  Social research officer, government          Outpatient Encounter Prescriptions as of 06/18/2014  Medication Sig  . atorvastatin (LIPITOR) 10 MG tablet Take 1 tablet (10 mg total) by mouth daily.  Marland Kitchen losartan (COZAAR) 50 MG tablet Take 1 tablet (50 mg total) by mouth daily. For high blood pressure (Patient not taking: Reported on 06/18/2014)  . [DISCONTINUED] cephALEXin (KEFLEX) 500 MG capsule Take 1 capsule (500 mg total) by mouth 3 (three) times daily.  . [DISCONTINUED] doxycycline (VIBRA-TABS) 100 MG tablet Take 1 tablet (100 mg total) by mouth 2 (two) times daily.    EXAM:  BP 158/70 mmHg  Temp(Src) 97.4 F (36.3 C) (Oral)  Ht 5' 1.25" (1.556 m)  Wt 151 lb 8 oz (68.72 kg)  BMI 28.38 kg/m2  Body mass index is 28.38 kg/(m^2).  GENERAL: vitals reviewed and listed above, alert, oriented, appears well hydrated and  in no acute distress HEENT: atraumatic, conjunctiva  clear, no obvious abnormalities on inspection of external nose and ears MS: moves all extremities  Left dorsal food some duskiness and   ? Fading bruise top of foot ? Navicular  Some flattening of arch  asymmterical foot callus no ulcers  Pulses intact. No difference passive active rom .  PSYCH: pleasant and cooperative, no obvious depression or anxiety  ASSESSMENT AND PLAN:  Discussed the following assessment and plan:  Foot pain, left - ? pronate gait issues poss  some swelling bruise consider stress fracture also pt does lots of walking - Plan: DG Foot Complete Left, Ambulatory referral to Sports Medicine  White coat syndrome without hypertension - reports at goal readings at home  bring in monitor to next visit agin to check   -Patient advised to return or notify health care team  if symptoms worsen ,persist or new concerns arise.  Patient Instructions   Will get referral to dr Darrick PennaFields . Can get x ray in interim .     If needed considier stress fracture other dx gait evaluation.       Lab Results  Component Value Date   WBC 7.8 07/24/2013   HGB 15.0 07/24/2013   HCT 44.6 07/24/2013   PLT 271.0 07/24/2013   GLUCOSE 92 07/24/2013   CHOL 267* 03/15/2014   TRIG 104.0 03/15/2014   HDL 74.90 03/15/2014   LDLDIRECT 191.8 07/22/2012   LDLCALC 171* 03/15/2014   ALT 19 07/24/2013   AST 28 07/24/2013   NA 140 07/24/2013   K 4.2 07/24/2013   CL 107 07/24/2013   CREATININE 1.0 07/24/2013   BUN 16 07/24/2013   CO2 26 07/24/2013   TSH 1.47 07/24/2013     Burna MortimerWanda K. Fernado Brigante M.D.  07/20/2014 next visit

## 2014-06-18 NOTE — Patient Instructions (Addendum)
Will get referral to dr Darrick PennaFields . Can get x ray in interim .    If needed considier stress fracture other dx gait evaluation.       Lab Results  Component Value Date   WBC 7.8 07/24/2013   HGB 15.0 07/24/2013   HCT 44.6 07/24/2013   PLT 271.0 07/24/2013   GLUCOSE 92 07/24/2013   CHOL 267* 03/15/2014   TRIG 104.0 03/15/2014   HDL 74.90 03/15/2014   LDLDIRECT 191.8 07/22/2012   LDLCALC 171* 03/15/2014   ALT 19 07/24/2013   AST 28 07/24/2013   NA 140 07/24/2013   K 4.2 07/24/2013   CL 107 07/24/2013   CREATININE 1.0 07/24/2013   BUN 16 07/24/2013   CO2 26 07/24/2013   TSH 1.47 07/24/2013

## 2014-07-20 ENCOUNTER — Other Ambulatory Visit: Payer: Medicare Other

## 2014-07-26 ENCOUNTER — Ambulatory Visit (INDEPENDENT_AMBULATORY_CARE_PROVIDER_SITE_OTHER): Payer: Medicare Other | Admitting: Sports Medicine

## 2014-07-26 ENCOUNTER — Encounter: Payer: Self-pay | Admitting: Sports Medicine

## 2014-07-26 VITALS — BP 177/76 | Ht 62.0 in | Wt 147.0 lb

## 2014-07-26 DIAGNOSIS — R269 Unspecified abnormalities of gait and mobility: Secondary | ICD-10-CM

## 2014-07-26 DIAGNOSIS — M25561 Pain in right knee: Secondary | ICD-10-CM | POA: Diagnosis not present

## 2014-07-26 DIAGNOSIS — M2012 Hallux valgus (acquired), left foot: Secondary | ICD-10-CM | POA: Diagnosis not present

## 2014-07-26 DIAGNOSIS — M21612 Bunion of left foot: Secondary | ICD-10-CM | POA: Insufficient documentation

## 2014-07-26 NOTE — Assessment & Plan Note (Signed)
I think we can improve knee pain with some home exercises  These were described today  She will also use a compression sleeve  Only very mild DJD changes

## 2014-07-26 NOTE — Assessment & Plan Note (Signed)
Arch padding today improved her gait and felt comfortable  We will have her return for custom orthotics that she can use and walking shoes

## 2014-07-26 NOTE — Progress Notes (Signed)
Patient ID: Pamela ApleySandra Strole, female   DOB: 04/10/1940, 74 y.o.   MRN: [id number]  Patient who is followed by Dr. [name] and has been in overall very good health She wants to continue exercises for fitness and weight loss  Walks 2 mi - 5 d per wk Feels wobbly at end Not foot pain but flat feet  Ant knee pain going down stairs No mechanical symptoms No swelling  Wonders about doing custom orthotics as the left foot continues to get worse  Examination Residents and in no acute distress Note she has a history of reactive systolic hypertension in doctor's offices  BP 177/76 mmHg  Ht 5\' 2"  (1.575 m)  Wt 147 lb (66.679 kg)  BMI 26.88 kg/m2   Lt Knee: Normal to inspection with no erythema or effusion or obvious bony abnormalities. Palpation normal with no warmth or joint line tenderness or patellar tenderness or condyle tenderness. ROM normal in flexion and extension and lower leg rotation. Ligaments with solid consistent endpoints including ACL, PCL, LCL, MCL. Negative Mcmurray's and provocative meniscal tests. Non painful patellar compression. Patellar and quadriceps tendons unremarkable. Hamstring and quadriceps strength is normal.  RT Comparatively the right knee has some mild degenerative change She has 10 less flexion but can get to a functional range of 130 She does have full extension Mild weakness of the right hip abductor  Feet show flattening of the longitudinal arch on the right  Left foot shows hallux rigidus in early bunion formation of the great toe There is collapse of the longitudinal arch with midfoot rotation and significant pronation There is probable subtalar joint subluxation of the left  Gait was neutral on the right but significantly pronated on the left

## 2014-07-26 NOTE — Patient Instructions (Signed)
New balance walkers/  adidas walkers/ brooks walkers High enough volume to allow you to add an orthotic Good cushion but not motion control or a lot of rigidity Should feel comfortable immediately  Keep up the walking Test the arch support on the left Test using the knee sleeve when active or walking a lot of steps Don't wear at night or when you are not active When sweaty you can wash in wash machine (don't dry) or shower  Exercises  Leg lifts - straight / lateral and inside do 10 at a time daily and gradually build up 3 mins per day total of either fast walk or fast peddling on bike helps with weight loss Try to bike at least twice a week  In a month or two set up time for me to make you orthotics with a new walking shoe

## 2014-07-26 NOTE — Assessment & Plan Note (Signed)
Because of the overall breakdown of the left foot she will need a custom orthotic to take pressure off the first MTP and prevent this bunion from getting worse

## 2014-07-27 ENCOUNTER — Encounter: Payer: Self-pay | Admitting: Internal Medicine

## 2014-07-27 ENCOUNTER — Ambulatory Visit (INDEPENDENT_AMBULATORY_CARE_PROVIDER_SITE_OTHER): Payer: Medicare Other | Admitting: Internal Medicine

## 2014-07-27 VITALS — BP 170/100 | Temp 98.6°F | Ht 61.0 in | Wt 151.3 lb

## 2014-07-27 DIAGNOSIS — Z5320 Procedure and treatment not carried out because of patient's decision for unspecified reasons: Secondary | ICD-10-CM | POA: Diagnosis not present

## 2014-07-27 DIAGNOSIS — R030 Elevated blood-pressure reading, without diagnosis of hypertension: Secondary | ICD-10-CM

## 2014-07-27 DIAGNOSIS — IMO0001 Reserved for inherently not codable concepts without codable children: Secondary | ICD-10-CM

## 2014-07-27 DIAGNOSIS — Z1159 Encounter for screening for other viral diseases: Secondary | ICD-10-CM | POA: Diagnosis not present

## 2014-07-27 DIAGNOSIS — E785 Hyperlipidemia, unspecified: Secondary | ICD-10-CM | POA: Diagnosis not present

## 2014-07-27 DIAGNOSIS — Z0000 Encounter for general adult medical examination without abnormal findings: Secondary | ICD-10-CM | POA: Diagnosis not present

## 2014-07-27 LAB — CBC WITH DIFFERENTIAL/PLATELET
BASOS PCT: 0.7 % (ref 0.0–3.0)
Basophils Absolute: 0 10*3/uL (ref 0.0–0.1)
Eosinophils Absolute: 0.2 10*3/uL (ref 0.0–0.7)
Eosinophils Relative: 3.1 % (ref 0.0–5.0)
HCT: 44.3 % (ref 36.0–46.0)
Hemoglobin: 15.2 g/dL — ABNORMAL HIGH (ref 12.0–15.0)
Lymphocytes Relative: 36.1 % (ref 12.0–46.0)
Lymphs Abs: 2.6 10*3/uL (ref 0.7–4.0)
MCHC: 34.3 g/dL (ref 30.0–36.0)
MCV: 90.7 fl (ref 78.0–100.0)
MONO ABS: 0.6 10*3/uL (ref 0.1–1.0)
Monocytes Relative: 8.5 % (ref 3.0–12.0)
Neutro Abs: 3.7 10*3/uL (ref 1.4–7.7)
Neutrophils Relative %: 51.6 % (ref 43.0–77.0)
Platelets: 276 10*3/uL (ref 150.0–400.0)
RBC: 4.89 Mil/uL (ref 3.87–5.11)
RDW: 13.7 % (ref 11.5–15.5)
WBC: 7.1 10*3/uL (ref 4.0–10.5)

## 2014-07-27 LAB — BASIC METABOLIC PANEL
BUN: 15 mg/dL (ref 6–23)
CHLORIDE: 105 meq/L (ref 96–112)
CO2: 26 mEq/L (ref 19–32)
Calcium: 9.6 mg/dL (ref 8.4–10.5)
Creatinine, Ser: 0.86 mg/dL (ref 0.40–1.20)
GFR: 68.44 mL/min (ref >60.00)
Glucose, Bld: 91 mg/dL (ref 70–99)
POTASSIUM: 3.9 meq/L (ref 3.5–5.1)
Sodium: 139 mEq/L (ref 135–145)

## 2014-07-27 LAB — HEPATIC FUNCTION PANEL
ALBUMIN: 4.1 g/dL (ref 3.5–5.2)
ALT: 23 U/L (ref 0–35)
AST: 27 U/L (ref 0–37)
Alkaline Phosphatase: 60 U/L (ref 39–117)
Bilirubin, Direct: 0.1 mg/dL (ref 0.0–0.3)
TOTAL PROTEIN: 7.3 g/dL (ref 6.0–8.3)
Total Bilirubin: 0.6 mg/dL (ref 0.2–1.2)

## 2014-07-27 LAB — LIPID PANEL
CHOLESTEROL: 226 mg/dL — AB (ref 0–200)
HDL: 75.1 mg/dL (ref >39.00)
LDL Cholesterol: 133 mg/dL — ABNORMAL HIGH (ref 0–99)
NONHDL: 150.9
Total CHOL/HDL Ratio: 3
Triglycerides: 89 mg/dL (ref 0.0–149.0)
VLDL: 17.8 mg/dL (ref 0.0–40.0)

## 2014-07-27 LAB — TSH: TSH: 2.16 u[IU]/mL (ref 0.35–4.50)

## 2014-07-27 MED ORDER — LOSARTAN POTASSIUM 25 MG PO TABS: 25.0000 mg | ORAL_TABLET | Freq: Every day | ORAL | Status: DC

## 2014-07-27 NOTE — Patient Instructions (Addendum)
Can try low dose losartan for bp  If getting low readings below 110 100  And feeling lighted headed after initial adjustment period would stop it. Otherwise   120 is probably better than 135 for you.  Will notify you  of labs when available. Yearly flu vaccine   Let us know if you reconsider mammogram screening.  dexa bone density if you wish  Continue weight bearing exercise as possible and fu with Dr Darrick PennaFields.  Continue yearly flu  Vaccine    Bone Health Our bones do many things. They provide structure, protect organs, anchor muscles, and store calcium. Adequate calcium in your diet and weight-bearing physical activity help build strong bones, improve bone amounts, and may reduce the risk of weakening of bones (osteoporosis) later in life. PEAK BONE MASS By age 75, the average woman has acquired most of her skeletal bone mass. A large decline occurs in older adults which increases the risk of osteoporosis. In women this occurs around the time of menopause. It is important for young girls to reach their peak bone mass in order to maintain bone health throughout life. A person with high bone mass as a young adult will be more likely to have a higher bone mass later in life. Not enough calcium consumption and physical activity early on could result in a failure to achieve optimum bone mass in adulthood. OSTEOPOROSIS Osteoporosis is a disease of the bones. It is defined as low bone mass with deterioration of bone structure. Osteoporosis leads to an increase risk of fractures with falls. These fractures commonly happen in the wrist, hip, and spine. While men and women of all ages and background can develop osteoporosis, some of the risk factors for osteoporosis are:  Female.  White.  Postmenopausal.  Older adults.  Small in body size.  Eating a diet low in calcium.  Physically inactive.  Smoking.  Use of some medications.  Family history. CALCIUM Calcium is a mineral needed by the  body for healthy bones, teeth, and proper function of the heart, muscles, and nerves. The body cannot produce calcium so it must be absorbed through food. Good sources of calcium include:  Dairy products (low fat or nonfat milk, cheese, and yogurt).  Dark green leafy vegetables (bok choy and broccoli).  Calcium fortified foods (orange juice, cereal, bread, soy beverages, and tofu products).  Nuts (almonds). Recommended amounts of calcium vary for individuals. RECOMMENDED CALCIUM INTAKES Age and Amount in mg per day  Children 1 to 3 years / 700 mg  Children 4 to 8 years / 1,000 mg  Children 9 to 13 years / 1,300 mg  Teens 14 to 18 years / 1,300 mg  Adults 19 to 50 years / 1,000 mg  Adult women 51 to 70 years / 1,200 mg  Adults 71 years and older / 1,200 mg  Pregnant and breastfeeding teens / 1,300 mg  Pregnant and breastfeeding adults / 1,000 mg Vitamin D also plays an important role in healthy bone development. Vitamin D helps in the absorption of calcium. WEIGHT-BEARING PHYSICAL ACTIVITY Regular physical activity has many positive health benefits. Benefits include strong bones. Weight-bearing physical activity early in life is important in reaching peak bone mass. Weight-bearing physical activities cause muscles and bones to work against gravity. Some examples of weight bearing physical activities include:  Walking, jogging, or running.  DIRECT[employer]Field Hockey.  Jumping rope.  Dancing.  Soccer.  Tennis or Racquetball.  Stair climbing.  Basketball.  Hiking.  Weight lifting.  Aerobic fitness classes. Including weight-bearing physical activity into an exercise plan is a great way to keep bones healthy. Adults: Engage in at least 30 minutes of moderate physical activity on most, preferably all, days of the week. Children: Engage in at least 60 minutes of moderate physical activity on most, preferably all, days of the week. FOR MORE INFORMATION Armenianited Optician, dispensingtates Department of  Agriculture, OceanographerCenter for [employer]utrition Policy and Promotion: www.cnpp.usda.gov National Osteoporosis Foundation: RecruitSuit.cawww.nof.org Document Released: 05/16/2003 Document Revised: 06/20/2012 Document Reviewed: 08/15/2008 [facility] Patient Information 2015 [place], [place]. This information is not intended to replace advice given to you by your health care provider. Make sure you discuss any questions you have with your health care provider.

## 2014-07-27 NOTE — Progress Notes (Signed)
Chief Complaint  Patient presents with  . Medicare Wellness    medication management on lipid med    HPI: Pamela Alvarez 74 y.o. comes in today for Preventive Medicare wellness visit . amd Chronic disease management BP: brings in home readings  140 135 range  2 readings below 130 pulses 70 - 85 range  diastolic  70 - 89 range  Ave 132 median 136  Exercising seeing dr Darrick Penna  for foot pain and to get shoe inserts  LIPID:taking med    sometimes dizzy  in pm thought it was the med so taking at night now gets some dry mouth    Cough allergy pnd sx   on flonase nasacort for allergy . ?  helps   Health Maintenance  Topic Date Due  . DEXA SCAN  12/15/2004  . MAMMOGRAM  03/10/2007  . INFLUENZA VACCINE  10/08/2014  . COLONOSCOPY  02/11/2016  . TETANUS/TDAP  07/08/2018  . ZOSTAVAX  Completed  . PNA vac Low Risk Adult  Completed   Health Maintenance Review LIFESTYLE:  Exercise:  Walking  Tobacco/ETS:no Alcohol: per day 1 Sugar beverages:no Sleep: 8 hours  Drug use: no Bone density:  Had screen heel life line  Neg fam hx neg fracture not intereested now in scan will consdier  Colonoscopy:  utd  mammo declines at this time MEDICARE DOCUMENT QUESTIONS  TO SCAN   Hearing: ok  Vision:  No limitations at present . Last eye check UTD  Safety:  Has smoke detector and wears seat belts.  No firearms. No excess sun exposure. Sees dentist regularly.  Falls: no  Advance directive :  Reviewed  Has will still soesn have one aware  Memory:   , no concern from her or her family.some prob name findings   Depression: No anhedonia unusual crying or depressive symptoms  Nutrition: Eats well balanced diet; adequate calcium and vitamin D. No swallowing chewing problems.  Injury: no major injuries in the last six months.  Other healthcare providers:  Reviewed today .  Social:  Lives with spouse married. No pets.   Preventive parameters: up-to-date  Reviewed  Discussed and declined   ADLS:    There are no problems or need for assistance  driving, feeding, obtaining food, dressing, toileting and bathing, managing money using phone. She is independent.  ROS:  See above  GEN/ HEENT: No fever, significant weight changes sweats headaches vision problems hearing changes, CV/ PULM; No chest pain shortness of breath cough, syncope,edema  change in exercise tolerance. GI /GU: No adominal pain, vomiting, change in bowel habits. No blood in the stool. No significant GU symptoms. SKIN/HEME: ,no acute skin rashes suspicious lesions or bleeding. No lymphadenopathy, nodules, masses.  NEURO/ PSYCH:  No neurologic signs such as weakness numbness. No depression anxiety. IMM/ Allergy: No unusual infections.  Allergy .   REST of 12 system review negative except as per HPI   Past Medical History  Diagnosis Date  . Hyperlipidemia   . Hx of colonic polyps   . H/O echocardiogram 02-2000  . Cyst 07-2008    Cyst on back x2 that were drained    Family History  Problem Relation Age of Onset  . Hypertension Mother   . Parkinsonism Mother   . Angina Father   . Heart disease Father   . Colon cancer Father   . Lung cancer Father   . Skin cancer Father   . Cancer Father     Heart  . Breast  cancer Sister     over [id number]  . Diabetes Sister   . Heart disease Brother [id number]    triple bypass surgery  . Macular degeneration Sister   . Diabetes Sister     prediabetic  . Hearing loss Sister     hearing problems  . Vasculitis Sister     History   Social History  . Marital Status: Married    Spouse Name: N/A  . Number of Children: N/A  . Years of Education: N/A   Occupational History  . retired     Social research officer, governmentchool psychologist [name]   Social History Main Topics  . Smoking status: Never Smoker   . Smokeless tobacco: Not on file  . Alcohol Use: Yes  . Drug Use: Yes  . Sexual Activity: Not on file   Other Topics Concern  . None   Social History Narrative   Regular exercise-yes   Moved back to [place]  from [place]   Hh of 2 cat   G3 P2    Exercises walking regu;arly    Retired  Social research officer, governmentchool psychologist          Outpatient Encounter Prescriptions as of 07/27/2014  Medication Sig  . atorvastatin (LIPITOR) 10 MG tablet Take 1 tablet (10 mg total) by mouth daily.  Marland Kitchen. losartan (COZAAR) 25 MG tablet Take 1 tablet (25 mg total) by mouth daily.  . [DISCONTINUED] losartan (COZAAR) 50 MG tablet Take 1 tablet (50 mg total) by mouth daily. For high blood pressure (Patient not taking: Reported on 06/18/2014)   No facility-administered encounter medications on file as of 07/27/2014.    EXAM:  BP 170/100 mmHg  Temp(Src) 98.6 F (37 C) (Oral)  Ht 5\' 1"  (1.549 m)  Wt 151 lb 4.8 oz (68.629 kg)  BMI 28.60 kg/m2  Body mass index is 28.6 kg/(m^2).  Physical Exam: Vital signs reviewed ZOX:WRUEGEN:This is a well-developed well-nourished alert cooperative   who appears stated age in no acute distress.  HEENT: normocephalic atraumatic , Eyes: PERRL EOM's full, conjunctiva clearglasses , Nares: paten,t no deformity discharge or tenderness., Ears: no deformity EAC's clear TMs with normal landmarks. Mouth: clear OP, no lesions, edema.  Moist mucous membranes. Dentition in adequate repair. NECK: supple without masses, thyromegaly or bruits. CHEST/PULM:  Clear to auscultation and percussion breath sounds equal no wheeze , rales or rhonchi. No chest wall deformities or tenderness.Breast: normal by inspection . No dimpling, discharge, masses, tenderness or discharge . CV: PMI is nondisplaced, S1 S2 no gallops, murmurs, rubs. Peripheral pulses are full without delay.No JVD .  ABDOMEN: Bowel sounds normal nontender  No guard or rebound, no hepato splenomegal no CVA tenderness.  No hernia. Extremtities:  No clubbing cyanosis or edema, no acute joint swelling or redness no focal atrophy NEURO:  Oriented x3, cranial nerves 3-12 appear to be intact, no obvious focal weakness,gait within normal limits no abnormal reflexes or  asymmetrical SKIN: No acute rashes normal turgor, color, no bruising or petechiae. Wynelle LinkSun and age changes  PSYCH: Oriented, good eye contact, no obvious depression anxiety, cognition and judgment appear normal. LN: no cervical axillary inguinal adenopathy No noted deficits in memory, attention, and speech.   Lab Results  Component Value Date   WBC 7.8 07/24/2013   HGB 15.0 07/24/2013   HCT 44.6 07/24/2013   PLT 271.0 07/24/2013   GLUCOSE 92 07/24/2013   CHOL 267* 03/15/2014   TRIG 104.0 03/15/2014   HDL 74.90 03/15/2014   LDLDIRECT 191.8 07/22/2012   LDLCALC 171*  03/15/2014   ALT 19 07/24/2013   AST 28 07/24/2013   NA 140 07/24/2013   K 4.2 07/24/2013   CL 107 07/24/2013   CREATININE 1.0 07/24/2013   BUN 16 07/24/2013   CO2 26 07/24/2013   TSH 1.47 07/24/2013   BP Readings from Last 3 Encounters:  07/27/14 170/100  07/26/14 177/76  06/18/14 158/70   Wt Readings from Last 3 Encounters:  07/27/14 151 lb 4.8 oz (68.629 kg)  07/26/14 147 lb (66.679 kg)  06/18/14 151 lb 8 oz (68.72 kg)    ASSESSMENT AND PLAN:  Discussed the following assessment and plan:  Visit for preventive health examination - declines mammo will think about dexa not high risk .  - Plan: Basic metabolic panel, CBC with Differential/Platelet, Hepatic function panel, Lipid panel, TSH, Hepatitis C antibody  Medicare annual wellness visit, subsequent  Hyperlipidemia - Plan: Basic metabolic panel, CBC with Differential/Platelet, Hepatic function panel, Lipid panel, TSH  White coat syndrome without hypertension - Plan: Basic metabolic panel, CBC with Differential/Platelet, Hepatic function panel, Lipid panel, TSH  Elevated BP - baseline  creeping up  trial of low dose arb ( hx of cough allergy so arb not ace)25 mg per day rov in 3 months expectant managment  Need for hepatitis C screening test - screen by guidlines not high risk otherwise  - Plan: Hepatitis C antibody  Mammogram declined  Risk benefit of  medication discussed.  Patient Care Team: Madelin HeadingsWanda K Panosh, MD as PCP - General Bernette Redbirdobert Buccini, MD (Gastroenterology) Elise BenneSigmund Gould, MD (Ophthalmology) Venancio PoissonLaura Lomax, MD as Attending Physician (Dermatology) Enid BaasKarl Fields, MD as Consulting Physician (Sports Medicine)  Patient Instructions  Can try low dose losartan for bp  If getting low readings below 110 100  And feeling lighted headed after initial adjustment period would stop it. Otherwise   120 is probably better than 135 for you.  Will notify you  of labs when available. Yearly flu vaccine   Let us know if you reconsider mammogram screening.  dexa bone density if you wish  Continue weight bearing exercise as possible and fu with Dr Darrick PennaFields.  Continue yearly flu  Vaccine    Bone Health Our bones do many things. They provide structure, protect organs, anchor muscles, and store calcium. Adequate calcium in your diet and weight-bearing physical activity help build strong bones, improve bone amounts, and may reduce the risk of weakening of bones (osteoporosis) later in life. PEAK BONE MASS By age 75, the average woman has acquired most of her skeletal bone mass. A large decline occurs in older adults which increases the risk of osteoporosis. In women this occurs around the time of menopause. It is important for young girls to reach their peak bone mass in order to maintain bone health throughout life. A person with high bone mass as a young adult will be more likely to have a higher bone mass later in life. Not enough calcium consumption and physical activity early on could result in a failure to achieve optimum bone mass in adulthood. OSTEOPOROSIS Osteoporosis is a disease of the bones. It is defined as low bone mass with deterioration of bone structure. Osteoporosis leads to an increase risk of fractures with falls. These fractures commonly happen in the wrist, hip, and spine. While men and women of all ages and background can develop  osteoporosis, some of the risk factors for osteoporosis are:  Female.  White.  Postmenopausal.  Older adults.  Small in body size.  Eating a  diet low in calcium.  Physically inactive.  Smoking.  Use of some medications.  Family history. CALCIUM Calcium is a mineral needed by the body for healthy bones, teeth, and proper function of the heart, muscles, and nerves. The body cannot produce calcium so it must be absorbed through food. Good sources of calcium include:  Dairy products (low fat or nonfat milk, cheese, and yogurt).  Dark green leafy vegetables (bok choy and broccoli).  Calcium fortified foods (orange juice, cereal, bread, soy beverages, and tofu products).  Nuts (almonds). Recommended amounts of calcium vary for individuals. RECOMMENDED CALCIUM INTAKES Age and Amount in mg per day  Children 1 to 3 years / 700 mg  Children 4 to 8 years / 1,000 mg  Children 9 to 13 years / 1,300 mg  Teens 14 to 18 years / 1,300 mg  Adults 19 to 50 years / 1,000 mg  Adult women 51 to 70 years / 1,200 mg  Adults 71 years and older / 1,200 mg  Pregnant and breastfeeding teens / 1,300 mg  Pregnant and breastfeeding adults / 1,000 mg Vitamin D also plays an important role in healthy bone development. Vitamin D helps in the absorption of calcium. WEIGHT-BEARING PHYSICAL ACTIVITY Regular physical activity has many positive health benefits. Benefits include strong bones. Weight-bearing physical activity early in life is important in reaching peak bone mass. Weight-bearing physical activities cause muscles and bones to work against gravity. Some examples of weight bearing physical activities include:  Walking, jogging, or running.  [employer].  Jumping rope.  Dancing.  Soccer.  Tennis or Racquetball.  Stair climbing.  Basketball.  Hiking.  Weight lifting.  Aerobic fitness classes. Including weight-bearing physical activity into an exercise plan is a great  way to keep bones healthy. Adults: Engage in at least 30 minutes of moderate physical activity on most, preferably all, days of the week. Children: Engage in at least 60 minutes of moderate physical activity on most, preferably all, days of the week. FOR MORE INFORMATION [place] [employer], [employer] for [employer] and Promotion: www.cnpp.usda.gov National Osteoporosis Foundation: [employer] Document Released: 05/16/2003 Document Revised: 06/20/2012 Document Reviewed: 08/15/2008 [facility] Patient Information 2015 [place], [place]. This information is not intended to replace advice given to you by your health care provider. Make sure you discuss any questions you have with your health care provider.     Neta Mends. Panosh M.D.

## 2014-07-28 LAB — HEPATITIS C ANTIBODY: HCV AB: NEGATIVE

## 2014-07-30 ENCOUNTER — Other Ambulatory Visit: Payer: Self-pay | Admitting: Family Medicine

## 2014-07-30 MED ORDER — ATORVASTATIN CALCIUM 10 MG PO TABS: 10.0000 mg | ORAL_TABLET | Freq: Every day | ORAL | Status: DC

## 2014-07-30 NOTE — Telephone Encounter (Signed)
Sent to the pharmacy by e-scribe. 

## 2014-10-03 ENCOUNTER — Encounter: Payer: Self-pay | Admitting: Sports Medicine

## 2014-10-03 ENCOUNTER — Ambulatory Visit (INDEPENDENT_AMBULATORY_CARE_PROVIDER_SITE_OTHER): Payer: Medicare Other | Admitting: Sports Medicine

## 2014-10-03 VITALS — BP 183/73 | HR 89 | Ht 62.0 in | Wt 150.0 lb

## 2014-10-03 DIAGNOSIS — M2012 Hallux valgus (acquired), left foot: Secondary | ICD-10-CM

## 2014-10-03 DIAGNOSIS — M21612 Bunion of left foot: Secondary | ICD-10-CM

## 2014-10-03 DIAGNOSIS — R269 Unspecified abnormalities of gait and mobility: Secondary | ICD-10-CM | POA: Diagnosis not present

## 2014-10-03 NOTE — Assessment & Plan Note (Signed)
Better arch support gave her some relief of pain  We will build this into custom orthotic

## 2014-10-03 NOTE — Assessment & Plan Note (Signed)
Patient was fitted for a : standard, cushioned, semi-rigid orthotic. The orthotic was heated and afterward the patient stood on the orthotic blank positioned on the orthotic stand. The patient was positioned in subtalar neutral position and 10 degrees of ankle dorsiflexion in a weight bearing stance. After completion of molding, a stable base was applied to the orthotic blank. The blank was ground to a stable position for weight bearing. Size: 6 blue leather cover/EVA Base: med blue EVA Posting: none Additional orthotic padding: none  Preparation time was 45 minutes  At completion of orthotic she was able to walk with neutral gait and we were able to eliminate all the pronation  She is to use these in all walking shoes  Add some exercises for leg muscle strength  Recheck as needed or if any problems

## 2014-10-03 NOTE — Progress Notes (Signed)
Patient ID: Pamela Alvarez, female   DOB: 31-Jul-1940, 74 y.o.   MRN: [id number]  Patient wants to resume walking program She felt like she was getting too m uch pressure over her knees On the left foot she would get calluses and pain over a bunion She has tried different shoes with little success We added some arch support her shoes and this helped  Today she comes back for custom orthotics to see if this will relieve more of the pain  Examination No acute distress Right foot is pronated with loss of longitudinal arch Pronation is mild Rear foot is still slightly in varus  Left foot shows valgus deviation of the first toe that is mild Limited extension of the first toe Loss of the longitudinal arch Abnormal calluses over the medial forefoot and toe  Walking gait is pronated more on the left

## 2015-01-04 ENCOUNTER — Ambulatory Visit (INDEPENDENT_AMBULATORY_CARE_PROVIDER_SITE_OTHER): Payer: Medicare Other | Admitting: Family Medicine

## 2015-01-04 DIAGNOSIS — Z23 Encounter for immunization: Secondary | ICD-10-CM

## 2015-09-10 NOTE — Progress Notes (Signed)
Pre visit review using our clinic review tool, if applicable. No additional management support is needed unless otherwise documented below in the visit note.  Chief Complaint  Patient presents with  . Swollen Ankle, Left    HPI: Pamela Alvarez 75 y.o.  With husband  Comes inf for sda appt   Friday .   Needed help and went in side and then took advill and and another  . Stayed off of it.  And   Icing it    3-4 x per day  per day . Tylenol.   Can weight bear minimally  Pain lateral ankle and eversion.  Wearing husbands old cam boot    So far  Prev injury  May have had a frozen  food    No twising injury  No fx hx .   husband feels she trips a lot  >? Dizziness   bpt at home is ok in past .   No meds not taking the coxaar cause she says bp in 120 130 range  ROS: See pertinent positives and negatives per HPI. No cp sob numbness weaknses  Past Medical History  Diagnosis Date  . Hyperlipidemia   . Hx of colonic polyps   . H/O echocardiogram 02-2000  . Cyst 07-2008    Cyst on back x2 that were drained    Family History  Problem Relation Age of Onset  . Hypertension Mother   . Parkinsonism Mother   . Angina Father   . Heart disease Father   . Colon cancer Father   . Lung cancer Father   . Skin cancer Father   . Cancer Father     Heart  . Breast cancer Sister     over [id number]  . Diabetes Sister   . Heart disease Brother [id number]    triple bypass surgery  . Macular degeneration Sister   . Diabetes Sister     prediabetic  . Hearing loss Sister     hearing problems  . Vasculitis Sister     Social History   Social History  . Marital Status: Married    Spouse Name: N/A  . Number of Children: N/A  . Years of Education: N/A   Occupational History  . retired     Social research officer, governmentchool psychologist [name]   Social History Main Topics  . Smoking status: Never Smoker   . Smokeless tobacco: None  . Alcohol Use: Yes  . Drug Use: Yes  . Sexual Activity: Not Asked   Other Topics Concern  . None    Social History Narrative   Regular exercise-yes   Moved back to [place] from [place]   Hh of 2 cat   G3 P2    Exercises walking regu;arly    Retired  Social research officer, governmentchool psychologist          Outpatient Prescriptions Prior to Visit  Medication Sig Dispense Refill  . atorvastatin (LIPITOR) 10 MG tablet Take 1 tablet (10 mg total) by mouth daily. 90 tablet 3  . losartan (COZAAR) 25 MG tablet Take 1 tablet (25 mg total) by mouth daily. 90 tablet 1   No facility-administered medications prior to visit.     EXAM:  BP 184/86 mmHg  Pulse 113  Temp(Src) 99.2 F (37.3 C) (Temporal)  SpO2 98%  There is no weight on file to calculate BMI.  GENERAL: vitals reviewed and listed above, alert, oriented, appears well hydrated and in no acute distress HEENT: atraumatic, conjunctiva  clear, no obvious abnormalities on inspection  of external nose and ears  lle  with 1-2 + lateral mallelar swelling   Mild bruising  Neg sqeeze    Tender lat malleolus but not point   Distal fib and ant talo lig   Heel nl    4/10 ( husband says great pain tolerance )  rom  Almost nl  MS: moves all extremities without noticeable focal  Abnormality nv seems nl  PSYCH: pleasant and cooperative, no obvious depression or anxiety Lab Results  Component Value Date   WBC 7.1 07/27/2014   HGB 15.2* 07/27/2014   HCT 44.3 07/27/2014   PLT 276.0 07/27/2014   GLUCOSE 91 07/27/2014   CHOL 226* 07/27/2014   TRIG 89.0 07/27/2014   HDL 75.10 07/27/2014   LDLDIRECT 191.8 07/22/2012   LDLCALC 133* 07/27/2014   ALT 23 07/27/2014   AST 27 07/27/2014   NA 139 07/27/2014   K 3.9 07/27/2014   CL 105 07/27/2014   CREATININE 0.86 07/27/2014   BUN 15 07/27/2014   CO2 26 07/27/2014   TSH 2.16 07/27/2014    ASSESSMENT AND PLAN:  Discussed the following assessment and plan:  Ankle injuries, left, initial encounter - Plan: DG Ankle Complete Left  Fall, initial encounter - Plan: DG Ankle Complete Left  Closed fracture of left fibula,  initial encounter  Elevated BP X ray  R/o fib fx   If ok then  Support and rov in 1 months  If fx then  Ortho to see X ray mild disc distal fib fracture  Will plan referral ortho    elevated bp as in past    Didn't take med cause says bp is good .  Hx of dizziness at times and ?  falling?   No injury except this on  llast 2 steps of garage .  -Patient advised to return or notify health care team  if symptoms worsen ,persist or new concerns arise.  Patient Instructions  This acts like a grade 2 sprain .      contininue  PRICE  Get x ray  Will let you know results when available .   Bring your bp   Machine to next visit .   RICE for Routine Care of Injuries Theroutine careofmanyinjuriesincludes rest, ice, compression, and elevation (RICE therapy). RICE therapy is often recommended for injuries to soft tissues, such as a muscle strain, ligament injuries, bruises, and overuse injuries. It can also be used for some bony injuries. Using RICE therapy can help to relieve pain, lessen swelling, and enable your body to heal. Rest Rest is required to allow your body to heal. This usually involves reducing your normal activities and avoiding use of the injured part of your body. Generally, you can return to your normal activities when you are comfortable and have been given permission by your health care provider. Ice Icing your injury helps to keep the swelling down, and it lessens pain. Do not apply ice directly to your skin.  Put ice in a plastic bag.  Place a towel between your skin and the bag.  Leave the ice on for 20 minutes, 2-3 times a day. Do this for as long as you are directed by your health care provider. Compression Compression means putting pressure on the injured area. Compression helps to keep swelling down, gives support, and helps with discomfort. Compression may be done with an elastic bandage. If an elastic bandage has been applied, follow these general tips:  Remove  and reapply the bandage every  3-4 hours or as directed by your health care provider.  Make sure the bandage is not wrapped too tightly, because this can cut off circulation. If part of your body beyond the bandage becomes blue, numb, cold, swollen, or more painful, your bandage is most likely too tight. If this occurs, remove your bandage and reapply it more loosely.  See your health care provider if the bandage seems to be making your problems worse rather than better. Elevation Elevation means keeping the injured area raised. This helps to lessen swelling and decrease pain. If possible, your injured area should be elevated at or above the level of your heart or the center of your chest. WHEN SHOULD I SEEK MEDICAL CARE? You should seek medical care if:  Your pain and swelling continue.  Your symptoms are getting worse rather than improving. These symptoms may indicate that further evaluation or further X-rays are needed. Sometimes, X-rays may not show a small broken bone (fracture) until a number of days later. Make a follow-up appointment with your health care provider. WHEN SHOULD I SEEK IMMEDIATE MEDICAL CARE? You should seek immediate medical care if:  You have sudden severe pain at or below the area of your injury.  You have redness or increased swelling around your injury.  You have tingling or numbness at or below the area of your injury that does not improve after you remove the elastic bandage.   This information is not intended to replace advice given to you by your health care provider. Make sure you discuss any questions you have with your health care provider.   Document Released: 06/07/2000 Document Revised: 11/14/2014 Document Reviewed: 01/31/2014 Elsevier Interactive Patient Education 2016 [employer]  Acute Ankle Sprain With Phase I Rehab An acute ankle sprain is a partial or complete tear in one or more of the ligaments of the ankle due to traumatic injury. The  severity of the injury depends on both the number of ligaments sprained and the grade of sprain. There are 3 grades of sprains.   A grade 1 sprain is a mild sprain. There is a slight pull without obvious tearing. There is no loss of strength, and the muscle and ligament are the correct length.  A grade 2 sprain is a moderate sprain. There is tearing of fibers within the substance of the ligament where it connects two bones or two cartilages. The length of the ligament is increased, and there is usually decreased strength.  A grade 3 sprain is a complete rupture of the ligament and is uncommon. In addition to the grade of sprain, there are three types of ankle sprains.  Lateral ankle sprains: This is a sprain of one or more of the three ligaments on the outer side (lateral) of the ankle. These are the most common sprains. Medial ankle sprains: There is one large triangular ligament of the inner side (medial) of the ankle that is susceptible to injury. Medial ankle sprains are less common. Syndesmosis, "high ankle," sprains: The syndesmosis is the ligament that connects the two bones of the lower leg. Syndesmosis sprains usually only occur with very severe ankle sprains. SYMPTOMS  Pain, tenderness, and swelling in the ankle, starting at the side of injury that may progress to the whole ankle and foot with time.  "Pop" or tearing sensation at the time of injury.  Bruising that may spread to the heel.  Impaired ability to walk soon after injury. CAUSES   Acute ankle sprains are caused by trauma  placed on the ankle that temporarily forces or pries the anklebone (talus) out of its normal socket.  Stretching or tearing of the ligaments that normally hold the joint in place (usually due to a twisting injury). RISK INCREASES WITH:  Previous ankle sprain.  Sports in which the foot may land awkwardly (i.e., basketball, volleyball, or soccer) or walking or running on uneven or rough  surfaces.  Shoes with inadequate support to prevent sideways motion when stress occurs.  Poor strength and flexibility.  Poor balance skills.  Contact sports. PREVENTION   Warm up and stretch properly before activity.  Maintain physical fitness:  Ankle and leg flexibility, muscle strength, and endurance.  Cardiovascular fitness.  Balance training activities.  Use proper technique and have a coach correct improper technique.  Taping, protective strapping, bracing, or high-top tennis shoes may help prevent injury. Initially, tape is best; however, it loses most of its support function within 10 to 15 minutes.  Wear proper-fitted protective shoes (High-top shoes with taping or bracing is more effective than either alone).  Provide the ankle with support during sports and practice activities for 12 months following injury. PROGNOSIS   If treated properly, ankle sprains can be expected to recover completely; however, the length of recovery depends on the degree of injury.  A grade 1 sprain usually heals enough in 5 to 7 days to allow modified activity and requires an average of 6 weeks to heal completely.  A grade 2 sprain requires 6 to 10 weeks to heal completely.  A grade 3 sprain requires 12 to 16 weeks to heal.  A syndesmosis sprain often takes more than 3 months to heal. RELATED COMPLICATIONS   Frequent recurrence of symptoms may result in a chronic problem. Appropriately addressing the problem the first time decreases the frequency of recurrence and optimizes healing time. Severity of the initial sprain does not predict the likelihood of later instability.  Injury to other structures (bone, cartilage, or tendon).  A chronically unstable or arthritic ankle joint is a possibility with repeated sprains. TREATMENT Treatment initially involves the use of ice, medication, and compression bandages to help reduce pain and inflammation. Ankle sprains are usually immobilized in  a walking cast or boot to allow for healing. Crutches may be recommended to reduce pressure on the injury. After immobilization, strengthening and stretching exercises may be necessary to regain strength and a full range of motion. Surgery is rarely needed to treat ankle sprains. MEDICATION   Nonsteroidal anti-inflammatory medications, such as aspirin and ibuprofen (do not take for the first 3 days after injury or within 7 days before surgery), or other minor pain relievers, such as acetaminophen, are often recommended. Take these as directed by your caregiver. Contact your caregiver immediately if any bleeding, stomach upset, or signs of an allergic reaction occur from these medications.  Ointments applied to the skin may be helpful.  Pain relievers may be prescribed as necessary by your caregiver. Do not take prescription pain medication for longer than 4 to 7 days. Use only as directed and only as much as you need. HEAT AND COLD  Cold treatment (icing) is used to relieve pain and reduce inflammation for acute and chronic cases. Cold should be applied for 10 to 15 minutes every 2 to 3 hours for inflammation and pain and immediately after any activity that aggravates your symptoms. Use ice packs or an ice massage.  Heat treatment may be used before performing stretching and strengthening activities prescribed by your caregiver. Use  a heat pack or a warm soak. SEEK IMMEDIATE MEDICAL CARE IF:   Pain, swelling, or bruising worsens despite treatment.  You experience pain, numbness, discoloration, or coldness in the foot or toes.  New, unexplained symptoms develop (drugs used in treatment may produce side effects.) EXERCISES  PHASE I EXERCISES RANGE OF MOTION (ROM) AND STRETCHING EXERCISES - Ankle Sprain, Acute Phase I, Weeks 1 to 2 These exercises may help you when beginning to restore flexibility in your ankle. You will likely work on these exercises for the 1 to 2 weeks after your injury. Once  your physician, physical therapist, or athletic trainer sees adequate progress, he or she will advance your exercises. While completing these exercises, remember:   Restoring tissue flexibility helps normal motion to return to the joints. This allows healthier, less painful movement and activity.  An effective stretch should be held for at least 30 seconds.  A stretch should never be painful. You should only feel a gentle lengthening or release in the stretched tissue. RANGE OF MOTION - Dorsi/Plantar Flexion  While sitting with your right / left knee straight, draw the top of your foot upwards by flexing your ankle. Then reverse the motion, pointing your toes downward.  Hold each position for __________ seconds.  After completing your first set of exercises, repeat this exercise with your knee bent. Repeat __________ times. Complete this exercise __________ times per day.  RANGE OF MOTION - Ankle Alphabet  Imagine your right / left big toe is a pen.  Keeping your hip and knee still, write out the entire alphabet with your "pen." Make the letters as large as you can without increasing any discomfort. Repeat __________ times. Complete this exercise __________ times per day.  STRENGTHENING EXERCISES - Ankle Sprain, Acute -Phase I, Weeks 1 to 2 These exercises may help you when beginning to restore strength in your ankle. You will likely work on these exercises for 1 to 2 weeks after your injury. Once your physician, physical therapist, or athletic trainer sees adequate progress, he or she will advance your exercises. While completing these exercises, remember:   Muscles can gain both the endurance and the strength needed for everyday activities through controlled exercises.  Complete these exercises as instructed by your physician, physical therapist, or athletic trainer. Progress the resistance and repetitions only as guided.  You may experience muscle soreness or fatigue, but the pain or  discomfort you are trying to eliminate should never worsen during these exercises. If this pain does worsen, stop and make certain you are following the directions exactly. If the pain is still present after adjustments, discontinue the exercise until you can discuss the trouble with your clinician. STRENGTH - Dorsiflexors  Secure a rubber exercise band/tubing to a fixed object (i.e., table, pole) and loop the other end around your right / left foot.  Sit on the floor facing the fixed object. The band/tubing should be slightly tense when your foot is relaxed.  Slowly draw your foot back toward you using your ankle and toes.  Hold this position for __________ seconds. Slowly release the tension in the band and return your foot to the starting position. Repeat __________ times. Complete this exercise __________ times per day.  STRENGTH - Plantar-flexors   Sit with your right / left leg extended. Holding onto both ends of a rubber exercise band/tubing, loop it around the ball of your foot. Keep a slight tension in the band.  Slowly push your toes away from you, pointing  them downward.  Hold this position for __________ seconds. Return slowly, controlling the tension in the band/tubing. Repeat __________ times. Complete this exercise __________ times per day.  STRENGTH - Ankle Eversion  Secure one end of a rubber exercise band/tubing to a fixed object (table, pole). Loop the other end around your foot just before your toes.  Place your fists between your knees. This will focus your strengthening at your ankle.  Drawing the band/tubing across your opposite foot, slowly, pull your little toe out and up. Make sure the band/tubing is positioned to resist the entire motion.  Hold this position for __________ seconds. Have your muscles resist the band/tubing as it slowly pulls your foot back to the starting position.  Repeat __________ times. Complete this exercise __________ times per day.   STRENGTH - Ankle Inversion  Secure one end of a rubber exercise band/tubing to a fixed object (table, pole). Loop the other end around your foot just before your toes.  Place your fists between your knees. This will focus your strengthening at your ankle.  Slowly, pull your big toe up and in, making sure the band/tubing is positioned to resist the entire motion.  Hold this position for __________ seconds.  Have your muscles resist the band/tubing as it slowly pulls your foot back to the starting position. Repeat __________ times. Complete this exercises __________ times per day.  STRENGTH - Towel Curls  Sit in a chair positioned on a non-carpeted surface.  Place your right / left foot on a towel, keeping your heel on the floor.  Pull the towel toward your heel by only curling your toes. Keep your heel on the floor.  If instructed by your physician, physical therapist, or athletic trainer, add weight to the end of the towel. Repeat __________ times. Complete this exercise __________ times per day.   This information is not intended to replace advice given to you by your health care provider. Make sure you discuss any questions you have with your health care provider.   Document Released: 09/24/2004 Document Revised: 03/16/2014 Document Reviewed: 06/07/2008 Elsevier Interactive Patient Education 2016 [employer].  Fall Prevention in the Home  Falls can cause injuries and can affect people from all age groups. There are many simple things that you can do to make your home safe and to help prevent falls. WHAT CAN I DO ON THE OUTSIDE OF MY HOME?  Regularly repair the edges of walkways and driveways and fix any cracks.  Remove high doorway thresholds.  Trim any shrubbery on the main path into your home.  Use bright outdoor lighting.  Clear walkways of debris and clutter, including tools and rocks.  Regularly check that handrails are securely fastened and in good repair. Both  sides of any steps should have handrails.  Install guardrails along the edges of any raised decks or porches.  Have leaves, snow, and ice cleared regularly.  Use sand or salt on walkways during winter months.  In the garage, clean up any spills right away, including grease or oil spills. WHAT CAN I DO IN THE BATHROOM?  Use night lights.  Install grab bars by the toilet and in the tub and shower. Do not use towel bars as grab bars.  Use non-skid mats or decals on the floor of the tub or shower.  If you need to sit down while you are in the shower, use a plastic, non-slip stool.[place] [employer]  Keep the floor dry. Immediately clean up any water that spills on  the floor.  Remove soap buildup in the tub or shower on a regular basis.  Attach bath mats securely with double-sided non-slip rug tape.  Remove throw rugs and other tripping hazards from the floor. WHAT CAN I DO IN THE BEDROOM?  Use night lights.  Make sure that a bedside light is easy to reach.  Do not use oversized bedding that drapes onto the floor.  Have a firm chair that has side arms to use for getting dressed.  Remove throw rugs and other tripping hazards from the floor. WHAT CAN I DO IN THE KITCHEN?   Clean up any spills right away.  Avoid walking on wet floors.  Place frequently used items in easy-to-reach places.  If you need to reach for something above you, use a sturdy step stool that has a grab bar.  Keep electrical cables out of the way.  Do not use floor polish or wax that makes floors slippery. If you have to use wax, make sure that it is non-skid floor wax.  Remove throw rugs and other tripping hazards from the floor. WHAT CAN I DO IN THE STAIRWAYS?  Do not leave any items on the stairs.  Make sure that there are handrails on both sides of the stairs. Fix handrails that are broken or loose. Make sure that handrails are as long as the stairways.  Check any carpeting to make sure that it is firmly  attached to the stairs. Fix any carpet that is loose or worn.  Avoid having throw rugs at the top or bottom of stairways, or secure the rugs with carpet tape to prevent them from moving.  Make sure that you have a light switch at the top of the stairs and the bottom of the stairs. If you do not have them, have them installed. WHAT ARE SOME OTHER FALL PREVENTION TIPS?  Wear closed-toe shoes that fit well and support your feet. Wear shoes that have rubber soles or low heels.  When you use a stepladder, make sure that it is completely opened and that the sides are firmly locked. Have someone hold the ladder while you are using it. Do not climb a closed stepladder.  Add color or contrast paint or tape to grab bars and handrails in your home. Place contrasting color strips on the first and last steps.  Use mobility aids as needed, such as canes, walkers, scooters, and crutches.  Turn on lights if it is dark. Replace any light bulbs that burn out.  Set up furniture so that there are clear paths. Keep the furniture in the same spot.  Fix any uneven floor surfaces.  Choose a carpet design that does not hide the edge of steps of a stairway.  Be aware of any and all pets.  Review your medicines with your healthcare provider. Some medicines can cause dizziness or changes in blood pressure, which increase your risk of falling. Talk with your health care provider about other ways that you can decrease your risk of falls. This may include working with a physical therapist or trainer to improve your strength, balance, and endurance.   This information is not intended to replace advice given to you by your health care provider. Make sure you discuss any questions you have with your health care provider.   Document Released: 02/13/2002 Document Revised: 07/10/2014 Document Reviewed: 03/30/2014 Elsevier Interactive Patient Education 2016 [employer].       [place]. Arlie Posch M.D.

## 2015-09-11 ENCOUNTER — Other Ambulatory Visit: Payer: Self-pay | Admitting: Family Medicine

## 2015-09-11 ENCOUNTER — Ambulatory Visit (INDEPENDENT_AMBULATORY_CARE_PROVIDER_SITE_OTHER): Payer: Medicare Other | Admitting: Internal Medicine

## 2015-09-11 ENCOUNTER — Encounter: Payer: Self-pay | Admitting: Internal Medicine

## 2015-09-11 ENCOUNTER — Ambulatory Visit (INDEPENDENT_AMBULATORY_CARE_PROVIDER_SITE_OTHER)
Admission: RE | Admit: 2015-09-11 | Discharge: 2015-09-11 | Disposition: A | Discharge status: Home / Self Care | Payer: Medicare Other | Source: Ambulatory Visit | Attending: Internal Medicine | Admitting: Internal Medicine

## 2015-09-11 VITALS — BP 184/86 | HR 113 | Temp 99.2°F

## 2015-09-11 DIAGNOSIS — S99912A Unspecified injury of left ankle, initial encounter: Secondary | ICD-10-CM

## 2015-09-11 DIAGNOSIS — W19XXXA Unspecified fall, initial encounter: Secondary | ICD-10-CM

## 2015-09-11 DIAGNOSIS — R030 Elevated blood-pressure reading, without diagnosis of hypertension: Secondary | ICD-10-CM

## 2015-09-11 DIAGNOSIS — S82402A Unspecified fracture of shaft of left fibula, initial encounter for closed fracture: Secondary | ICD-10-CM

## 2015-09-11 DIAGNOSIS — IMO0001 Reserved for inherently not codable concepts without codable children: Secondary | ICD-10-CM

## 2015-09-11 DIAGNOSIS — S82832A Other fracture of upper and lower end of left fibula, initial encounter for closed fracture: Secondary | ICD-10-CM

## 2015-09-11 NOTE — Patient Instructions (Addendum)
This acts like a grade 2 sprain .      contininue  PRICE  Get x ray  Will let you know results when available .   Bring your bp   Machine to next visit .   RICE for Routine Care of Injuries Theroutine careofmanyinjuriesincludes rest, ice, compression, and elevation (RICE therapy). RICE therapy is often recommended for injuries to soft tissues, such as a muscle strain, ligament injuries, bruises, and overuse injuries. It can also be used for some bony injuries. Using RICE therapy can help to relieve pain, lessen swelling, and enable your body to heal. Rest Rest is required to allow your body to heal. This usually involves reducing your normal activities and avoiding use of the injured part of your body. Generally, you can return to your normal activities when you are comfortable and have been given permission by your health care provider. Ice Icing your injury helps to keep the swelling down, and it lessens pain. Do not apply ice directly to your skin.  Put ice in a plastic bag.  Place a towel between your skin and the bag.  Leave the ice on for 20 minutes, 2-3 times a day. Do this for as long as you are directed by your health care provider. Compression Compression means putting pressure on the injured area. Compression helps to keep swelling down, gives support, and helps with discomfort. Compression may be done with an elastic bandage. If an elastic bandage has been applied, follow these general tips:  Remove and reapply the bandage every 3-4 hours or as directed by your health care provider.  Make sure the bandage is not wrapped too tightly, because this can cut off circulation. If part of your body beyond the bandage becomes blue, numb, cold, swollen, or more painful, your bandage is most likely too tight. If this occurs, remove your bandage and reapply it more loosely.  See your health care provider if the bandage seems to be making your problems worse rather than  better. Elevation Elevation means keeping the injured area raised. This helps to lessen swelling and decrease pain. If possible, your injured area should be elevated at or above the level of your heart or the center of your chest. WHEN SHOULD I SEEK MEDICAL CARE? You should seek medical care if:  Your pain and swelling continue.  Your symptoms are getting worse rather than improving. These symptoms may indicate that further evaluation or further X-rays are needed. Sometimes, X-rays may not show a small broken bone (fracture) until a number of days later. Make a follow-up appointment with your health care provider. WHEN SHOULD I SEEK IMMEDIATE MEDICAL CARE? You should seek immediate medical care if:  You have sudden severe pain at or below the area of your injury.  You have redness or increased swelling around your injury.  You have tingling or numbness at or below the area of your injury that does not improve after you remove the elastic bandage.   This information is not intended to replace advice given to you by your health care provider. Make sure you discuss any questions you have with your health care provider.   Document Released: 06/07/2000 Document Revised: 11/14/2014 Document Reviewed: 01/31/2014 Elsevier Interactive Patient Education 2016 [employer]  Acute Ankle Sprain With Phase I Rehab An acute ankle sprain is a partial or complete tear in one or more of the ligaments of the ankle due to traumatic injury. The severity of the injury depends on both the number  of ligaments sprained and the grade of sprain. There are 3 grades of sprains.   A grade 1 sprain is a mild sprain. There is a slight pull without obvious tearing. There is no loss of strength, and the muscle and ligament are the correct length.  A grade 2 sprain is a moderate sprain. There is tearing of fibers within the substance of the ligament where it connects two bones or two cartilages. The length of the  ligament is increased, and there is usually decreased strength.  A grade 3 sprain is a complete rupture of the ligament and is uncommon. In addition to the grade of sprain, there are three types of ankle sprains.  Lateral ankle sprains: This is a sprain of one or more of the three ligaments on the outer side (lateral) of the ankle. These are the most common sprains. Medial ankle sprains: There is one large triangular ligament of the inner side (medial) of the ankle that is susceptible to injury. Medial ankle sprains are less common. Syndesmosis, "high ankle," sprains: The syndesmosis is the ligament that connects the two bones of the lower leg. Syndesmosis sprains usually only occur with very severe ankle sprains. SYMPTOMS  Pain, tenderness, and swelling in the ankle, starting at the side of injury that may progress to the whole ankle and foot with time.  "Pop" or tearing sensation at the time of injury.  Bruising that may spread to the heel.  Impaired ability to walk soon after injury. CAUSES   Acute ankle sprains are caused by trauma placed on the ankle that temporarily forces or pries the anklebone (talus) out of its normal socket.  Stretching or tearing of the ligaments that normally hold the joint in place (usually due to a twisting injury). RISK INCREASES WITH:  Previous ankle sprain.  Sports in which the foot may land awkwardly (i.e., basketball, volleyball, or soccer) or walking or running on uneven or rough surfaces.  Shoes with inadequate support to prevent sideways motion when stress occurs.  Poor strength and flexibility.  Poor balance skills.  Contact sports. PREVENTION   Warm up and stretch properly before activity.  Maintain physical fitness:  Ankle and leg flexibility, muscle strength, and endurance.  Cardiovascular fitness.  Balance training activities.  Use proper technique and have a coach correct improper technique.  Taping, protective strapping,  bracing, or high-top tennis shoes may help prevent injury. Initially, tape is best; however, it loses most of its support function within 10 to 15 minutes.  Wear proper-fitted protective shoes (High-top shoes with taping or bracing is more effective than either alone).  Provide the ankle with support during sports and practice activities for 12 months following injury. PROGNOSIS   If treated properly, ankle sprains can be expected to recover completely; however, the length of recovery depends on the degree of injury.  A grade 1 sprain usually heals enough in 5 to 7 days to allow modified activity and requires an average of 6 weeks to heal completely.  A grade 2 sprain requires 6 to 10 weeks to heal completely.  A grade 3 sprain requires 12 to 16 weeks to heal.  A syndesmosis sprain often takes more than 3 months to heal. RELATED COMPLICATIONS   Frequent recurrence of symptoms may result in a chronic problem. Appropriately addressing the problem the first time decreases the frequency of recurrence and optimizes healing time. Severity of the initial sprain does not predict the likelihood of later instability.  Injury to other structures (bone,  cartilage, or tendon).  A chronically unstable or arthritic ankle joint is a possibility with repeated sprains. TREATMENT Treatment initially involves the use of ice, medication, and compression bandages to help reduce pain and inflammation. Ankle sprains are usually immobilized in a walking cast or boot to allow for healing. Crutches may be recommended to reduce pressure on the injury. After immobilization, strengthening and stretching exercises may be necessary to regain strength and a full range of motion. Surgery is rarely needed to treat ankle sprains. MEDICATION   Nonsteroidal anti-inflammatory medications, such as aspirin and ibuprofen (do not take for the first 3 days after injury or within 7 days before surgery), or other minor pain relievers,  such as acetaminophen, are often recommended. Take these as directed by your caregiver. Contact your caregiver immediately if any bleeding, stomach upset, or signs of an allergic reaction occur from these medications.  Ointments applied to the skin may be helpful.  Pain relievers may be prescribed as necessary by your caregiver. Do not take prescription pain medication for longer than 4 to 7 days. Use only as directed and only as much as you need. HEAT AND COLD  Cold treatment (icing) is used to relieve pain and reduce inflammation for acute and chronic cases. Cold should be applied for 10 to 15 minutes every 2 to 3 hours for inflammation and pain and immediately after any activity that aggravates your symptoms. Use ice packs or an ice massage.  Heat treatment may be used before performing stretching and strengthening activities prescribed by your caregiver. Use a heat pack or a warm soak. SEEK IMMEDIATE MEDICAL CARE IF:   Pain, swelling, or bruising worsens despite treatment.  You experience pain, numbness, discoloration, or coldness in the foot or toes.  New, unexplained symptoms develop (drugs used in treatment may produce side effects.) EXERCISES  PHASE I EXERCISES RANGE OF MOTION (ROM) AND STRETCHING EXERCISES - Ankle Sprain, Acute Phase I, Weeks 1 to 2 These exercises may help you when beginning to restore flexibility in your ankle. You will likely work on these exercises for the 1 to 2 weeks after your injury. Once your physician, physical therapist, or athletic trainer sees adequate progress, he or she will advance your exercises. While completing these exercises, remember:   Restoring tissue flexibility helps normal motion to return to the joints. This allows healthier, less painful movement and activity.  An effective stretch should be held for at least 30 seconds.  A stretch should never be painful. You should only feel a gentle lengthening or release in the stretched  tissue. RANGE OF MOTION - Dorsi/Plantar Flexion  While sitting with your right / left knee straight, draw the top of your foot upwards by flexing your ankle. Then reverse the motion, pointing your toes downward.  Hold each position for __________ seconds.  After completing your first set of exercises, repeat this exercise with your knee bent. Repeat __________ times. Complete this exercise __________ times per day.  RANGE OF MOTION - Ankle Alphabet  Imagine your right / left big toe is a pen.  Keeping your hip and knee still, write out the entire alphabet with your "pen." Make the letters as large as you can without increasing any discomfort. Repeat __________ times. Complete this exercise __________ times per day.  STRENGTHENING EXERCISES - Ankle Sprain, Acute -Phase I, Weeks 1 to 2 These exercises may help you when beginning to restore strength in your ankle. You will likely work on these exercises for 1 to 2  weeks after your injury. Once your physician, physical therapist, or athletic trainer sees adequate progress, he or she will advance your exercises. While completing these exercises, remember:   Muscles can gain both the endurance and the strength needed for everyday activities through controlled exercises.  Complete these exercises as instructed by your physician, physical therapist, or athletic trainer. Progress the resistance and repetitions only as guided.  You may experience muscle soreness or fatigue, but the pain or discomfort you are trying to eliminate should never worsen during these exercises. If this pain does worsen, stop and make certain you are following the directions exactly. If the pain is still present after adjustments, discontinue the exercise until you can discuss the trouble with your clinician. STRENGTH - Dorsiflexors  Secure a rubber exercise band/tubing to a fixed object (i.e., table, pole) and loop the other end around your right / left foot.  Sit on the  floor facing the fixed object. The band/tubing should be slightly tense when your foot is relaxed.  Slowly draw your foot back toward you using your ankle and toes.  Hold this position for __________ seconds. Slowly release the tension in the band and return your foot to the starting position. Repeat __________ times. Complete this exercise __________ times per day.  STRENGTH - Plantar-flexors   Sit with your right / left leg extended. Holding onto both ends of a rubber exercise band/tubing, loop it around the ball of your foot. Keep a slight tension in the band.  Slowly push your toes away from you, pointing them downward.  Hold this position for __________ seconds. Return slowly, controlling the tension in the band/tubing. Repeat __________ times. Complete this exercise __________ times per day.  STRENGTH - Ankle Eversion  Secure one end of a rubber exercise band/tubing to a fixed object (table, pole). Loop the other end around your foot just before your toes.  Place your fists between your knees. This will focus your strengthening at your ankle.  Drawing the band/tubing across your opposite foot, slowly, pull your little toe out and up. Make sure the band/tubing is positioned to resist the entire motion.  Hold this position for __________ seconds. Have your muscles resist the band/tubing as it slowly pulls your foot back to the starting position.  Repeat __________ times. Complete this exercise __________ times per day.  STRENGTH - Ankle Inversion  Secure one end of a rubber exercise band/tubing to a fixed object (table, pole). Loop the other end around your foot just before your toes.  Place your fists between your knees. This will focus your strengthening at your ankle.  Slowly, pull your big toe up and in, making sure the band/tubing is positioned to resist the entire motion.  Hold this position for __________ seconds.  Have your muscles resist the band/tubing as it slowly  pulls your foot back to the starting position. Repeat __________ times. Complete this exercises __________ times per day.  STRENGTH - Towel Curls  Sit in a chair positioned on a non-carpeted surface.  Place your right / left foot on a towel, keeping your heel on the floor.  Pull the towel toward your heel by only curling your toes. Keep your heel on the floor.  If instructed by your physician, physical therapist, or athletic trainer, add weight to the end of the towel. Repeat __________ times. Complete this exercise __________ times per day.   This information is not intended to replace advice given to you by your health care provider. Make  sure you discuss any questions you have with your health care provider.   Document Released: 09/24/2004 Document Revised: 03/16/2014 Document Reviewed: 06/07/2008 Elsevier Interactive Patient Education 2016 [employer].  Fall Prevention in the Home  Falls can cause injuries and can affect people from all age groups. There are many simple things that you can do to make your home safe and to help prevent falls. WHAT CAN I DO ON THE OUTSIDE OF MY HOME?  Regularly repair the edges of walkways and driveways and fix any cracks.  Remove high doorway thresholds.  Trim any shrubbery on the main path into your home.  Use bright outdoor lighting.  Clear walkways of debris and clutter, including tools and rocks.  Regularly check that handrails are securely fastened and in good repair. Both sides of any steps should have handrails.  Install guardrails along the edges of any raised decks or porches.  Have leaves, snow, and ice cleared regularly.  Use sand or salt on walkways during winter months.  In the garage, clean up any spills right away, including grease or oil spills. WHAT CAN I DO IN THE BATHROOM?  Use night lights.  Install grab bars by the toilet and in the tub and shower. Do not use towel bars as grab bars.  Use non-skid mats or  decals on the floor of the tub or shower.  If you need to sit down while you are in the shower, use a plastic, non-slip stool.[employer]  Keep the floor dry. Immediately clean up any water that spills on the floor.  Remove soap buildup in the tub or shower on a regular basis.  Attach bath mats securely with double-sided non-slip rug tape.  Remove throw rugs and other tripping hazards from the floor. WHAT CAN I DO IN THE BEDROOM?  Use night lights.  Make sure that a bedside light is easy to reach.  Do not use oversized bedding that drapes onto the floor.  Have a firm chair that has side arms to use for getting dressed.  Remove throw rugs and other tripping hazards from the floor. WHAT CAN I DO IN THE KITCHEN?   Clean up any spills right away.  Avoid walking on wet floors.  Place frequently used items in easy-to-reach places.  If you need to reach for something above you, use a sturdy step stool that has a grab bar.  Keep electrical cables out of the way.  Do not use floor polish or wax that makes floors slippery. If you have to use wax, make sure that it is non-skid floor wax.  Remove throw rugs and other tripping hazards from the floor. WHAT CAN I DO IN THE STAIRWAYS?  Do not leave any items on the stairs.  Make sure that there are handrails on both sides of the stairs. Fix handrails that are broken or loose. Make sure that handrails are as long as the stairways.  Check any carpeting to make sure that it is firmly attached to the stairs. Fix any carpet that is loose or worn.  Avoid having throw rugs at the top or bottom of stairways, or secure the rugs with carpet tape to prevent them from moving.  Make sure that you have a light switch at the top of the stairs and the bottom of the stairs. If you do not have them, have them installed. WHAT ARE SOME OTHER FALL PREVENTION TIPS?  Wear closed-toe shoes that fit well and support your feet. Wear shoes that have rubber  soles or low  heels.  When you use a stepladder, make sure that it is completely opened and that the sides are firmly locked. Have someone hold the ladder while you are using it. Do not climb a closed stepladder.  Add color or contrast paint or tape to grab bars and handrails in your home. Place contrasting color strips on the first and last steps.  Use mobility aids as needed, such as canes, walkers, scooters, and crutches.  Turn on lights if it is dark. Replace any light bulbs that burn out.  Set up furniture so that there are clear paths. Keep the furniture in the same spot.  Fix any uneven floor surfaces.  Choose a carpet design that does not hide the edge of steps of a stairway.  Be aware of any and all pets.  Review your medicines with your healthcare provider. Some medicines can cause dizziness or changes in blood pressure, which increase your risk of falling. Talk with your health care provider about other ways that you can decrease your risk of falls. This may include working with a physical therapist or trainer to improve your strength, balance, and endurance.   This information is not intended to replace advice given to you by your health care provider. Make sure you discuss any questions you have with your health care provider.   Document Released: 02/13/2002 Document Revised: 07/10/2014 Document Reviewed: 03/30/2014 Elsevier Interactive Patient Education [employer]

## 2015-09-13 ENCOUNTER — Other Ambulatory Visit: Payer: Self-pay | Admitting: Internal Medicine

## 2015-09-13 NOTE — Telephone Encounter (Signed)
Sent to the pharmacy by e[facility].  Pt has cpx on 12/09/15

## 2015-10-01 ENCOUNTER — Ambulatory Visit (INDEPENDENT_AMBULATORY_CARE_PROVIDER_SITE_OTHER): Payer: Medicare Other | Admitting: Internal Medicine

## 2015-10-01 ENCOUNTER — Encounter: Payer: Self-pay | Admitting: Internal Medicine

## 2015-10-01 VITALS — BP 144/68 | HR 88 | Temp 97.7°F | Ht 62.0 in

## 2015-10-01 DIAGNOSIS — J329 Chronic sinusitis, unspecified: Secondary | ICD-10-CM | POA: Diagnosis not present

## 2015-10-01 DIAGNOSIS — R5383 Other fatigue: Secondary | ICD-10-CM

## 2015-10-01 DIAGNOSIS — IMO0001 Reserved for inherently not codable concepts without codable children: Secondary | ICD-10-CM

## 2015-10-01 DIAGNOSIS — R030 Elevated blood-pressure reading, without diagnosis of hypertension: Secondary | ICD-10-CM

## 2015-10-01 DIAGNOSIS — S82402S Unspecified fracture of shaft of left fibula, sequela: Secondary | ICD-10-CM

## 2015-10-01 MED ORDER — AMOXICILLIN 500 MG PO CAPS: 500.0000 mg | ORAL_CAPSULE | Freq: Three times a day (TID) | ORAL | 0 refills | Status: DC

## 2015-10-01 NOTE — Patient Instructions (Addendum)
Stop the delsym during the day as this may be causing some  drowsiness and appetite side effects.  Stay on the Flonase and nasal saline and antibiotic for sinusitis. Get upright to avoid positional dizziness. For now do not start the blood pressure medicine if you haven't already as long as her blood pressure is below 150 for now. This is to avoid side effects and dizziness when he stands. Check blood pressure readings at home twice a day for 3 days a week and arise consent needs readings into my chart and we can make decisions based on that.  Fu if not getting better by end of med  Or worse  Fever  Shortness of breath  Other concerns .     Sinusitis, Adult Sinusitis is redness, soreness, and inflammation of the paranasal sinuses. Paranasal sinuses are air pockets within the bones of your face. They are located beneath your eyes, in the middle of your forehead, and above your eyes. In healthy paranasal sinuses, mucus is able to drain out, and air is able to circulate through them by way of your nose. However, when your paranasal sinuses are inflamed, mucus and air can become trapped. This can allow bacteria and other germs to grow and cause infection. Sinusitis can develop quickly and last only a short time (acute) or continue over a long period (chronic). Sinusitis that lasts for more than 12 weeks is considered chronic. CAUSES Causes of sinusitis include:  Allergies.  Structural abnormalities, such as displacement of the cartilage that separates your nostrils (deviated septum), which can decrease the air flow through your nose and sinuses and affect sinus drainage.  Functional abnormalities, such as when the small hairs (cilia) that line your sinuses and help remove mucus do not work properly or are not present. SIGNS AND SYMPTOMS Symptoms of acute and chronic sinusitis are the same. The primary symptoms are pain and pressure around the affected sinuses. Other symptoms include:  Upper  toothache.  Earache.  Headache.  Bad breath.  Decreased sense of smell and taste.  A cough, which worsens when you are lying flat.  Fatigue.  Fever.  Thick drainage from your nose, which often is green and may contain pus (purulent).  Swelling and warmth over the affected sinuses. DIAGNOSIS Your health care provider will perform a physical exam. During your exam, your health care provider may perform any of the following to help determine if you have acute sinusitis or chronic sinusitis:  Look in your nose for signs of abnormal growths in your nostrils (nasal polyps).  Tap over the affected sinus to check for signs of infection.  View the inside of your sinuses using an imaging device that has a light attached (endoscope). If your health care provider suspects that you have chronic sinusitis, one or more of the following tests may be recommended:  Allergy tests.  Nasal culture. A sample of mucus is taken from your nose, sent to a lab, and screened for bacteria.  Nasal cytology. A sample of mucus is taken from your nose and examined by your health care provider to determine if your sinusitis is related to an allergy. TREATMENT Most cases of acute sinusitis are related to a viral infection and will resolve on their own within 10 days. Sometimes, medicines are prescribed to help relieve symptoms of both acute and chronic sinusitis. These may include pain medicines, decongestants, nasal steroid sprays, or saline sprays. However, for sinusitis related to a bacterial infection, your health care provider will prescribe  antibiotic medicines. These are medicines that will help kill the bacteria causing the infection. Rarely, sinusitis is caused by a fungal infection. In these cases, your health care provider will prescribe antifungal medicine. For some cases of chronic sinusitis, surgery is needed. Generally, these are cases in which sinusitis recurs more than 3 times per year, despite  other treatments. HOME CARE INSTRUCTIONS  Drink plenty of water. Water helps thin the mucus so your sinuses can drain more easily.  Use a humidifier.  Inhale steam 3-4 times a day (for example, sit in the bathroom with the shower running).  Apply a warm, moist washcloth to your face 3-4 times a day, or as directed by your health care provider.  Use saline nasal sprays to help moisten and clean your sinuses.  Take medicines only as directed by your health care provider.  If you were prescribed either an antibiotic or antifungal medicine, finish it all even if you start to feel better. SEEK IMMEDIATE MEDICAL CARE IF:  You have increasing pain or severe headaches.  You have nausea, vomiting, or drowsiness.  You have swelling around your face.  You have vision problems.  You have a stiff neck.  You have difficulty breathing.   This information is not intended to replace advice given to you by your health care provider. Make sure you discuss any questions you have with your health care provider.   Document Released: 02/23/2005 Document Revised: 03/16/2014 Document Reviewed: 03/10/2011 Elsevier Interactive Patient Education [employer]

## 2015-10-01 NOTE — Progress Notes (Signed)
Pre visit review using our clinic review tool, if applicable. No additional management support is needed unless otherwise documented below in the visit note.  Chief Complaint  Patient presents with  . Facial Pain  . Nasal Congestion  . Shortness of Breath  . Cough  . Headache  . Fatigue    HPI: Pamela Alvarez 75 y.o.  Comes in with husband today. See last notes she sustained an ankle injury and a distal fibular fracture in a cast that was just changed today. Dr. Dion Saucier wants her to be nonweightbearing. She has had some congestion for a couple of weeks without fever and using her Flonase. However it is worsening and her husband is worried about this. She had some coughing seriously at night and went to the pharmacy and was given Delsym which she's been taking every 12 hours last number of days. It does help her cough makes her drowsy and she goes to sleep. However husband is concerned that she spends most of the day in bed.   20 22 hours  A  day in bed .. Dr Dion Saucier up and around recently  Not eating a lot  .   Per husband .  But drinking fluids   Concern about  This situation.  138 /76  Close  bp varied not that bad   138 at home  Once  hasn;t started bp med at this time   Patient feels not that bad.   Or ill  No sob but snoring at night and no cp or hemoptysis  Or inc leg pain  I having some Ha face pressure and inc congestion  Head   No current pain med  ROS: See pertinent positives and negatives per HPI. Plans to move  To one story home .   Past Medical History:  Diagnosis Date  . Cyst 07-2008   Cyst on back x2 that were drained  . H/O echocardiogram 02-2000  . Hx of colonic polyps   . Hyperlipidemia     Family History  Problem Relation Age of Onset  . Hypertension Mother   . Parkinsonism Mother   . Angina Father   . Heart disease Father   . Colon cancer Father   . Lung cancer Father   . Skin cancer Father   . Cancer Father     Heart  . Breast cancer Sister     over 42    . Diabetes Sister   . Heart disease Brother 60    triple bypass surgery  . Macular degeneration Sister   . Diabetes Sister     prediabetic  . Hearing loss Sister     hearing problems  . Vasculitis Sister     Social History   Social History  . Marital status: Married    Spouse name: N/A  . Number of children: N/A  . Years of education: N/A   Occupational History  . retired     Social research officer, government [name]   Social History Main Topics  . Smoking status: Never Smoker  . Smokeless tobacco: None  . Alcohol use Yes  . Drug use:   . Sexual activity: Not Asked   Other Topics Concern  . None   Social History Narrative   Regular exercise-yes   Moved back to [place] from [place]   Hh of 2 cat   G3 P2    Exercises walking regu;arly    Retired  Social research officer, government          Outpatient  Medications Prior to Visit  Medication Sig Dispense Refill  . atorvastatin (LIPITOR) 10 MG tablet Take 1 tablet (10 mg total) by mouth daily. 90 tablet 3  . losartan (COZAAR) 25 MG tablet TAKE 1 TABLET (25 MG TOTAL) BY MOUTH DAILY. (Patient not taking: Reported on 10/01/2015) 90 tablet 0   No facility-administered medications prior to visit.      EXAM:  BP (!) 144/68 (BP Location: Left Arm, Patient Position: Sitting, Cuff Size: Normal)   Pulse 88   Temp 97.7 F (36.5 C) (Oral)   Ht 5\' 2"  (1.575 m)   SpO2 95%   There is no height or weight on file to calculate BMI. WDWN in NAD  quiet respirations; mildly congested  somewhat hoarse. Non toxic .sitting in WC left le with pink cast  No edema noted  HEENT: Normocephalic ;atraumatic , Eyes;  PERRL, EOMs  Full, lids and conjunctiva clear,,Ears: no deformities, canals 1+ wax  TM landmarks normal, Nose: no deformity or discharge but congested;face minimallyto non  tender Mouth : OP clear without lesion or edema . Neck: Supple without adenopathy or masses or bruits Chest:  Clear to A without wheezes rales or rhonchi  ? All  Fields  Intact  No cough  during exam  CV:  S1-S2 no gallops or murmurs  perfusion is normal pulse 88 reguular  Skin :nl perfusion and no acute rashes    ASSESSMENT AND PLAN:  Discussed the following assessment and plan:  Rhinosinusitis ? - pt feels allergic to dust  stayon flonase add amox be aware of and return for alarm symptoms.   Closed fracture of left fibula, sequela - [facility]  dr Dion Saucier  Elevated BP  White coat syndrome without hypertension - hold off on adding med  at this time  risk more than benefit unless reg over 150 at this time send in readings my chart   Other fatigue - poss from meds etc  bp machine showed 138( second readings)  and out readings about 144  -Patient advised to return or notify health care team  if symptoms worsen ,persist or new concerns arise.  Patient Instructions  Stop the delsym during the day as this may be causing some  drowsiness and appetite side effects.  Stay on the Flonase and nasal saline and antibiotic for sinusitis. Get upright to avoid positional dizziness. For now do not start the blood pressure medicine if you haven't already as long as her blood pressure is below 150 for now. This is to avoid side effects and dizziness when he stands. Check blood pressure readings at home twice a day for 3 days a week and arise consent needs readings into my chart and we can make decisions based on that.  Fu if not getting better by end of med  Or worse  Fever  Shortness of breath  Other concerns .     Sinusitis, Adult Sinusitis is redness, soreness, and inflammation of the paranasal sinuses. Paranasal sinuses are air pockets within the bones of your face. They are located beneath your eyes, in the middle of your forehead, and above your eyes. In healthy paranasal sinuses, mucus is able to drain out, and air is able to circulate through them by way of your nose. However, when your paranasal sinuses are inflamed, mucus and air can become trapped. This can allow  bacteria and other germs to grow and cause infection. Sinusitis can develop quickly and last only a short time (acute) or continue  over a long period (chronic). Sinusitis that lasts for more than 12 weeks is considered chronic. CAUSES Causes of sinusitis include:  Allergies.  Structural abnormalities, such as displacement of the cartilage that separates your nostrils (deviated septum), which can decrease the air flow through your nose and sinuses and affect sinus drainage.  Functional abnormalities, such as when the small hairs (cilia) that line your sinuses and help remove mucus do not work properly or are not present. SIGNS AND SYMPTOMS Symptoms of acute and chronic sinusitis are the same. The primary symptoms are pain and pressure around the affected sinuses. Other symptoms include:  Upper toothache.  Earache.  Headache.  Bad breath.  Decreased sense of smell and taste.  A cough, which worsens when you are lying flat.  Fatigue.  Fever.  Thick drainage from your nose, which often is green and may contain pus (purulent).  Swelling and warmth over the affected sinuses. DIAGNOSIS Your health care provider will perform a physical exam. During your exam, your health care provider may perform any of the following to help determine if you have acute sinusitis or chronic sinusitis:  Look in your nose for signs of abnormal growths in your nostrils (nasal polyps).  Tap over the affected sinus to check for signs of infection.  View the inside of your sinuses using an imaging device that has a light attached (endoscope). If your health care provider suspects that you have chronic sinusitis, one or more of the following tests may be recommended:  Allergy tests.  Nasal culture. A sample of mucus is taken from your nose, sent to a lab, and screened for bacteria.  Nasal cytology. A sample of mucus is taken from your nose and examined by your health care provider to determine if your  sinusitis is related to an allergy. TREATMENT Most cases of acute sinusitis are related to a viral infection and will resolve on their own within 10 days. Sometimes, medicines are prescribed to help relieve symptoms of both acute and chronic sinusitis. These may include pain medicines, decongestants, nasal steroid sprays, or saline sprays. However, for sinusitis related to a bacterial infection, your health care provider will prescribe antibiotic medicines. These are medicines that will help kill the bacteria causing the infection. Rarely, sinusitis is caused by a fungal infection. In these cases, your health care provider will prescribe antifungal medicine. For some cases of chronic sinusitis, surgery is needed. Generally, these are cases in which sinusitis recurs more than 3 times per year, despite other treatments. HOME CARE INSTRUCTIONS  Drink plenty of water. Water helps thin the mucus so your sinuses can drain more easily.  Use a humidifier.  Inhale steam 3-4 times a day (for example, sit in the bathroom with the shower running).  Apply a warm, moist washcloth to your face 3-4 times a day, or as directed by your health care provider.  Use saline nasal sprays to help moisten and clean your sinuses.  Take medicines only as directed by your health care provider.  If you were prescribed either an antibiotic or antifungal medicine, finish it all even if you start to feel better. SEEK IMMEDIATE MEDICAL CARE IF:  You have increasing pain or severe headaches.  You have nausea, vomiting, or drowsiness.  You have swelling around your face.  You have vision problems.  You have a stiff neck.  You have difficulty breathing.   This information is not intended to replace advice given to you by your health care provider. Make sure  you discuss any questions you have with your health care provider.   Document Released: 02/23/2005 Document Revised: 03/16/2014 Document Reviewed:  03/10/2011 Elsevier Interactive Patient Education 2016 [employer].     [place]. Brandin Stetzer M.D.

## 2015-10-22 ENCOUNTER — Encounter: Payer: Self-pay | Admitting: Physical Therapy

## 2015-10-22 ENCOUNTER — Ambulatory Visit: Payer: Medicare Other | Attending: Orthopedic Surgery | Admitting: Physical Therapy

## 2015-10-22 DIAGNOSIS — R2689 Other abnormalities of gait and mobility: Secondary | ICD-10-CM | POA: Insufficient documentation

## 2015-10-22 DIAGNOSIS — M25672 Stiffness of left ankle, not elsewhere classified: Secondary | ICD-10-CM | POA: Insufficient documentation

## 2015-10-22 DIAGNOSIS — R600 Localized edema: Secondary | ICD-10-CM | POA: Insufficient documentation

## 2015-10-22 DIAGNOSIS — M6281 Muscle weakness (generalized): Secondary | ICD-10-CM | POA: Diagnosis present

## 2015-10-22 NOTE — Therapy (Signed)
[facility] [address] [place], [place], [address] Phone: [phone]   Fax:  [phone]  Physical Therapy Evaluation  Patient Details  Name: Pamela Alvarez MRN: [id number] Date of Birth: [birth year] Referring Provider: Dr. Teryl Lucy  Encounter Date: 10/22/2015      PT End of Session - 10/22/15 1051    Visit Number 1   Number of Visits 10   Date for PT Re-Evaluation 12/17/15   Authorization Type medicare g-code at 15th visit   PT Start Time 1015   PT Stop Time 1050   PT Time Calculation (min) 35 min   Activity Tolerance Patient tolerated treatment well   Behavior During Therapy [place] for tasks assessed/performed      Past Medical History:  Diagnosis Date  . Cyst 07-2008   Cyst on back x2 that were drained  . H/O echocardiogram 02-2000  . Hx of colonic polyps   . Hyperlipidemia     Past Surgical History:  Procedure Laterality Date  . POLYPECTOMY     Colon  . TONSILLECTOMY    . TUBAL LIGATION      There were no vitals filed for this visit.       Subjective Assessment - 10/22/15 1022    Subjective Patient reports she fell down 3 steps going out to the garage.  The last 2 steps whe did not see it and tripped.  Patient was wearing shoes that was sticking. Happened on 09/11/2015.  Patient was placed into a cast with no weight on left leg for 6 weeks.  Patient started to wear the CAM foot on 10/21/2015.  Patient able to place WBAT and use a walker.    Limitations Walking   Patient Stated Goals Get strength back , not walk with a walker, return to driving   Currently in Pain? No/denies   Pain Score 5    Pain Location Leg   Pain Orientation Lower   Pain Descriptors / Indicators Dull   Pain Type Acute pain   Pain Onset More than a month ago   Pain Frequency Intermittent   Aggravating Factors  quick movement   Pain Relieving Factors wearing the boot   Multiple Pain Sites No            [facility] PT  Assessment - 10/22/15 0001      Assessment   Medical Diagnosis S/P distal fibula fracture   Referring Provider Dr. Teryl Lucy   Onset Date/Surgical Date 09/11/15   Prior Therapy None     Precautions   Precautions None     Restrictions   Weight Bearing Restrictions No     Balance Screen   Has the patient fallen in the past 6 months Yes   How many times? 1`  did not see the last 2 steps and trip on them   Has the patient had a decrease in activity level because of a fear of falling?  Yes   Is the patient reluctant to leave their home because of a fear of falling?  No     Home Environment   Living Environment Private residence   Living Arrangements Spouse/significant other   Type of Home House   Home Access Stairs to enter   Home Layout Two level  master bedroom on 2nd level     Prior Function   Level of Independence Independent with basic ADLs   Vocation Retired   Leisure Office manager  Overall Cognitive Status Within Functional Limits for tasks assessed     Observation/Other Assessments   Skin Integrity skin is intact but lots of dry skin shedding off left lower leg   Focus on Therapeutic Outcomes (FOTO)  75% limitation  48% limitation     Observation/Other Assessments-Edema    Edema Figure 8     Figure 8 Edema   Figure 8 - Left  56 cm     ROM / Strength   AROM / PROM / Strength AROM;PROM;Strength     AROM   AROM Assessment Site Ankle   Right/Left Ankle Left   Left Ankle Dorsiflexion -5   Left Ankle Plantar Flexion 20   Left Ankle Inversion 0   Left Ankle Eversion 5     PROM   PROM Assessment Site Ankle   Right/Left Ankle Left   Left Ankle Dorsiflexion -2   Left Ankle Plantar Flexion 25   Left Ankle Inversion 1   Left Ankle Eversion 6     Strength   Strength Assessment Site Ankle   Right/Left Knee Left   Left Knee Flexion 4/5   Left Knee Extension 3+/5   Right/Left Ankle Left   Left Ankle Dorsiflexion 1/5   Left Ankle Plantar  Flexion 1/5   Left Ankle Inversion 1/5   Left Ankle Eversion 1/5     Ambulation/Gait   Ambulation/Gait Yes   Assistive device Rolling walker   Gait Pattern Step-to pattern;Decreased step length - right;Decreased stance time - right  wears a CAM boot   Ambulation Surface Level   Stairs Yes   Stair Management Technique Step to pattern   Curb 6: Modified independent (Device/increase time)                           PT Education - 10/22/15 1051    Education provided Yes   Education Details ankle ROM exercises; contrast bath   Person(s) Educated Patient   Methods Explanation;Demonstration;Verbal cues;Handout   Comprehension Returned demonstration;Verbalized understanding          PT Short Term Goals - 10/22/15 1111      PT SHORT TERM GOAL #1   Title independent with initial HEP   Time 4   Period Weeks   Status New     PT SHORT TERM GOAL #2   Title ability to walk with a cane using the CAM boot with step through step pattern due to increased weightbear on the left   Time 4   Period Weeks   Status New     PT SHORT TERM GOAL #3   Title improve left ankle AROM >/= 5 degrees so she is able to ambulate with less pain.   Time 4   Period Weeks   Status New     PT SHORT TERM GOAL #4   Title swelling in left ankle decreases >/= 3 cm due to decreased edema   Time 4   Period Weeks           PT Long Term Goals - 10/22/15 1306      PT LONG TERM GOAL #1   Title independent with HEP   Time 8   Period Weeks   Status New     PT LONG TERM GOAL #2   Title ability to ambulate without an assistive device for 1000 feet so she is able to walk in the community with full weightbear on left   Time 8  Period Weeks   Status New     PT LONG TERM GOAL #3   Title left ankle AROM without normal limits so she is able to go up and down stairs with step over step pattern using  1 railing   Time 8   Period Weeks   Status New     PT LONG TERM GOAL #4   Title left  ankle strength >/= 4/5 so she is able to return to pilates exercise class   Time 8   Period Weeks   Status New     PT LONG TERM GOAL #5   Title FOTO score is </= 48% limitation   Time 8   Period Weeks   Status New               Plan - 10/22/15 1053    Clinical Impression Statement Patient is a 75 year old female with diagnosis of s/p distal fibula fracture on 09/11/2015 when she fell. Patient missed the last 2 steps leading out of her home.  Patient was in a cast till 10/21/2015 and was NWB.  Patient is now wearing a CAM boot on the left and WBAT using a walker.  Patient does a step to step pattern on level floor and steps.  Patient has increased swelling of left ankle, decreased left ankle ROM and strength.  Patient is not able to participate  in her Pilates classes.  Patient does not place full weight on left lower extremity with walking.  Patient reports left ankle pain is intermittent at level 5/10 with quick movements.  Patient is of low complexity due to no comorbities that will affect her rehab.  Patient will benefit form skilled therapy to inprove let ankle ROM and strength so she is able to ambulate.    Rehab Potential Excellent   Clinical Impairments Affecting Rehab Potential none   PT Frequency 2x / week   PT Duration 8 weeks   PT Treatment/Interventions Cryotherapy;Electrical Stimulation;Functional mobility training;Stair training;Gait training;Ultrasound;Moist Heat;Therapeutic activities;Therapeutic exercise;Balance training;Neuromuscular re-education;Patient/family education;Passive range of motion;Manual techniques;Dry needling;Vasopneumatic Device   PT Next Visit Plan nustep; left ankle ROM exercises; soft tissue work, ankle isometrics; gait training; vaspnuematic; left knee strength   PT Home Exercise Plan ankle isometrics   Recommended Other Services None   Consulted and Agree with Plan of Care Patient      Patient will benefit from skilled therapeutic intervention in  order to improve the following deficits and impairments:  Abnormal gait, Decreased range of motion, Difficulty walking, Increased fascial restricitons, Decreased endurance, Pain, Decreased activity tolerance, Decreased balance, Hypomobility, Impaired flexibility, Decreased strength, Decreased mobility, Increased edema  Visit Diagnosis: Muscle weakness (generalized) - Plan: PT plan of care cert/re-cert  Stiffness of left ankle, not elsewhere classified - Plan: PT plan of care cert/re-cert  Other abnormalities of gait and mobility - Plan: PT plan of care cert/re-cert  Localized edema - Plan: PT plan of care cert/re-cert      G-Codes - 10/22/15 1110    Functional Assessment Tool Used FOTO score is 75% limitation  goal is 48% limitation   Functional Limitation Mobility: Walking and moving around   Mobility: Walking and Moving Around Current Status ([id number]) At least 60 percent but less than 80 percent impaired, limited or restricted   Mobility: Walking and Moving Around Goal Status [phone]([id number]) At least 40 percent but less than 60 percent impaired, limited or restricted       Problem List Patient Active Problem List  Diagnosis Date Noted  . Elevated BP 07/27/2014  . Bunion of left foot 07/26/2014  . Abnormality of gait 07/26/2014  . Cough 11/10/2013  . Post-nasal drainage 11/10/2013  . Abnormal urine odor 11/10/2013  . Visit for preventive health examination 07/28/2013  . Right anterior knee pain 07/22/2012  . Medicare annual wellness visit, subsequent 07/25/2011  . ANXIETY, SITUATIONAL 09/25/2009  . ARTHRITIS 09/25/2009  . VITAMIN D DEFICIENCY 09/27/2008  . HYPERLIPIDEMIA 09/17/2008  . Elevated blood pressure reading without diagnosis of hypertension 09/17/2008  . COLONIC POLYPS, HX OF 09/17/2008  . PERSONAL HISTORY DISEASES SKIN&SUBCUT TISSUE 09/17/2008    Eulis Fosterheryl Gray, PT 10/22/15 1:11 PM   [facility] [address],  [address] [place], [place], [address] Phone: [phone]   Fax:  [phone]  Name: Pamela Alvarez MRN: [id number] Date of Birth: 12/02/1940

## 2015-10-22 NOTE — Patient Instructions (Addendum)
Quad Set    Slowly tighten thigh muscles of straight leg while counting out loud to __5__. Relax. Repeat __10__ times. Do _2___ sessions per day.  http://gt2.exer.us/706   Copyright  VHI. All rights reserved.  Straight Leg Raise    Tighten stomach and slowly raise locked right leg _6___ inches from floor. Repeat _10___ times per set. Do __1__ sets per session. Do __3__ sessions per day.  http://orth.exer.us/1103   Copyright  VHI. All rights reserved.   Ankle Pump    With left leg elevated, gently flex and extend ankle. Move through full range of motion. Avoid pain. Repeat _10___ times per set. Do __1__ sets per session. Do __3__ sessions per day.  http://orth.exer.us/32   Copyright  VHI. All rights reserved.  ROM: Inversion / Eversion    With left leg relaxed, gently turn ankle and foot in and out. Move through full range of motion. Avoid pain. Repeat __10__ times per set. Do __1__ sets per session. Do __2__ sessions per day.  http://orth.exer.us/36   Copyright  VHI. All rights reserved.  .Home Modalities: Contrast Bath    -Prepare two containers large enough for right foot. Fill one with warm water . Fill other with cold water . -Soak in warm water for _3__ minutes. -Then soak in cold water for _1__ minutes. Repeat for _15__ minutes, ending in warm water. Do __2-3_ times per day: before exercises after exercises ______.   Copyright  VHI. All rights reserved.  [facility] [address], [address] [place], [place] [address] Phone # [phone] Fax [phone]

## 2015-10-23 ENCOUNTER — Encounter: Payer: Self-pay | Admitting: Physical Therapy

## 2015-10-23 ENCOUNTER — Ambulatory Visit: Payer: Medicare Other | Admitting: Physical Therapy

## 2015-10-23 DIAGNOSIS — M6281 Muscle weakness (generalized): Secondary | ICD-10-CM

## 2015-10-23 DIAGNOSIS — R2689 Other abnormalities of gait and mobility: Secondary | ICD-10-CM

## 2015-10-23 DIAGNOSIS — M25672 Stiffness of left ankle, not elsewhere classified: Secondary | ICD-10-CM

## 2015-10-23 DIAGNOSIS — R600 Localized edema: Secondary | ICD-10-CM

## 2015-10-23 NOTE — Therapy (Signed)
[facility] [address], [address] [place], [place], [address] Phone: [phone]   Fax:  [phone]  Physical Therapy Treatment  Patient Details  Name: Pamela ApleySandra Alvarez MRN: [id number] Date of Birth: 02/20/1941 Referring Provider: Dr. Teryl LucyJoshua Landau  Encounter Date: 10/23/2015      PT End of Session - 10/23/15 1044    Visit Number 2   Number of Visits 10   Date for PT Re-Evaluation 12/17/15   Authorization Type medicare g-code at 15th visit   PT Start Time 1015   PT Stop Time 1105   PT Time Calculation (min) 50 min   Activity Tolerance Patient tolerated treatment well   Behavior During Therapy [facility] for tasks assessed/performed      Past Medical History:  Diagnosis Date  . Cyst 07-2008   Cyst on back x2 that were drained  . H/O echocardiogram 02-2000  . Hx of colonic polyps   . Hyperlipidemia     Past Surgical History:  Procedure Laterality Date  . POLYPECTOMY     Colon  . TONSILLECTOMY    . TUBAL LIGATION      There were no vitals filed for this visit.      Subjective Assessment - 10/23/15 1024    Subjective Has not tried contratst bath yet. No pain. No new complaints. Ankle and lower Lt is swollen.    Multiple Pain Sites No                         [facility] Adult PT Treatment/Exercise - 10/23/15 0001      Electrical Stimulation   Electrical Stimulation Location Lt ankle   Electrical Stimulation Action IFC   Electrical Stimulation Parameters 1-10 HZ   Electrical Stimulation Goals Edema     Vasopneumatic   Number Minutes Vasopneumatic  20 minutes   Vasopnuematic Location  Ankle   Vasopneumatic Pressure Medium   Vasopneumatic Temperature  3 flakes     Manual Therapy   Manual Therapy --  retrograde massage to Lt ankle, PROM all planes 20x     Ankle Exercises: Seated   Ankle Circles/Pumps AROM;Left;20 reps   BAPS Sitting;Level 1;10 reps   BAPS Limitations did DF/PF, then side to side 10x  with min asst to control the board.      Ankle Exercises: Supine   Other Supine Ankle Exercises Ankle ROM 20x in al l directions                PT Education - 10/23/15 1039    Education provided Yes   Education Details Consider wearing knee high compression stocking for swelling.    Person(s) Educated Patient;Spouse   Methods Explanation   Comprehension Verbalized understanding          PT Short Term Goals - 10/22/15 1111      PT SHORT TERM GOAL #1   Title independent with initial HEP   Time 4   Period Weeks   Status New     PT SHORT TERM GOAL #2   Title ability to walk with a cane using the CAM boot with step through step pattern due to increased weightbear on the left   Time 4   Period Weeks   Status New     PT SHORT TERM GOAL #3   Title improve left ankle AROM >/= 5 degrees so she is able to ambulate with less pain.   Time 4   Period Weeks  Status New     PT SHORT TERM GOAL #4   Title swelling in left ankle decreases >/= 3 cm due to decreased edema   Time 4   Period Weeks           PT Long Term Goals - 10/22/15 1306      PT LONG TERM GOAL #1   Title independent with HEP   Time 8   Period Weeks   Status New     PT LONG TERM GOAL #2   Title ability to ambulate without an assistive device for 1000 feet so she is able to walk in the community with full weightbear on left   Time 8   Period Weeks   Status New     PT LONG TERM GOAL #3   Title left ankle AROM without normal limits so she is able to go up and down stairs with step over step pattern using  1 railing   Time 8   Period Weeks   Status New     PT LONG TERM GOAL #4   Title left ankle strength >/= 4/5 so she is able to return to pilates exercise class   Time 8   Period Weeks   Status New     PT LONG TERM GOAL #5   Title FOTO score is </= 48% limitation   Time 8   Period Weeks   Status New               Plan - 10/23/15 1045    Clinical Impression Statement Remains  with swelling in Lt ankle and lower leg. Encouraged pt and spouse to consider wearing a compression stocking to the knee. They have a few at home and might try. Pt has not done any contrast baths but has moved the ankle around some.  Only first treatment so too early for goals.    Rehab Potential Excellent   Clinical Impairments Affecting Rehab Potential none   PT Frequency 2x / week   PT Duration 8 weeks   PT Treatment/Interventions Cryotherapy;Electrical Stimulation;Functional mobility training;Stair training;Gait training;Ultrasound;Moist Heat;Therapeutic activities;Therapeutic exercise;Balance training;Neuromuscular re-education;Patient/family education;Passive range of motion;Manual techniques;Dry needling;Vasopneumatic Device   PT Next Visit Plan Nustep, AROM/PROM, manual, edema control      Patient will benefit from skilled therapeutic intervention in order to improve the following deficits and impairments:  Abnormal gait, Decreased range of motion, Difficulty walking, Increased fascial restricitons, Decreased endurance, Pain, Decreased activity tolerance, Decreased balance, Hypomobility, Impaired flexibility, Decreased strength, Decreased mobility, Increased edema  Visit Diagnosis: Muscle weakness (generalized)  Stiffness of left ankle, not elsewhere classified  Other abnormalities of gait and mobility  Localized edema       G-Codes - 10/22/15 1110    Functional Assessment Tool Used FOTO score is 75% limitation  goal is 48% limitation   Functional Limitation Mobility: Walking and moving around   Mobility: Walking and Moving Around Current Status [phone](G8978) At least 60 percent but less than 80 percent impaired, limited or restricted   Mobility: Walking and Moving Around Goal Status [phone](G8979) At least 40 percent but less than 60 percent impaired, limited or restricted      Problem List Patient Active Problem List   Diagnosis Date Noted  . Elevated BP 07/27/2014  . Bunion of left  foot 07/26/2014  . Abnormality of gait 07/26/2014  . Cough 11/10/2013  . Post-nasal drainage 11/10/2013  . Abnormal urine odor 11/10/2013  . Visit for preventive health examination 07/28/2013  .  Right anterior knee pain 07/22/2012  . Medicare annual wellness visit, subsequent 07/25/2011  . ANXIETY, SITUATIONAL 09/25/2009  . ARTHRITIS 09/25/2009  . VITAMIN D DEFICIENCY 09/27/2008  . HYPERLIPIDEMIA 09/17/2008  . Elevated blood pressure reading without diagnosis of hypertension 09/17/2008  . COLONIC POLYPS, HX OF 09/17/2008  . PERSONAL HISTORY DISEASES SKIN&SUBCUT TISSUE 09/17/2008    Philis Doke, PTA 10/23/2015, 10:49 AM  [facility] [address], [address] [place], [place], [address] Phone: [phone]   Fax:  [phone]  Name: Sharlize Hoar MRN: [id number] Date of Birth: Dec 31, 1940

## 2015-10-24 ENCOUNTER — Ambulatory Visit: Payer: Medicare Other | Admitting: Physical Therapy

## 2015-10-24 ENCOUNTER — Encounter: Payer: Self-pay | Admitting: Physical Therapy

## 2015-10-24 DIAGNOSIS — M6281 Muscle weakness (generalized): Secondary | ICD-10-CM

## 2015-10-24 DIAGNOSIS — R600 Localized edema: Secondary | ICD-10-CM

## 2015-10-24 DIAGNOSIS — R2689 Other abnormalities of gait and mobility: Secondary | ICD-10-CM

## 2015-10-24 DIAGNOSIS — M25672 Stiffness of left ankle, not elsewhere classified: Secondary | ICD-10-CM

## 2015-10-24 NOTE — Therapy (Signed)
[facility] [address], [address] [place], [place], [address] Phone: [phone]   Fax:  [phone]  Physical Therapy Treatment  Patient Details  Name: Pamela Alvarez MRN: [id number] Date of Birth: 11/21/1940 Referring Provider: Dr. Teryl LucyJoshua Landau  Encounter Date: 10/24/2015      PT End of Session - 10/24/15 1000    Visit Number 3   Number of Visits 10   Date for PT Re-Evaluation 12/17/15   Authorization Type medicare g-code at 15th visit   PT Start Time 0926   PT Stop Time 1015   PT Time Calculation (min) 49 min   Activity Tolerance Patient tolerated treatment well   Behavior During Therapy [facility] for tasks assessed/performed      Past Medical History:  Diagnosis Date  . Cyst 07-2008   Cyst on back x2 that were drained  . H/O echocardiogram 02-2000  . Hx of colonic polyps   . Hyperlipidemia     Past Surgical History:  Procedure Laterality Date  . POLYPECTOMY     Colon  . TONSILLECTOMY    . TUBAL LIGATION      There were no vitals filed for this visit.      Subjective Assessment - 10/24/15 0931    Subjective Rt LE is hurting more now, feeling "ok". Has not found compression stocking yet   Patient Stated Goals Get strength back , not walk with a walker, return to driving   Currently in Pain? No/denies                         [facility] Treatment/Exercise - 10/24/15 0001      Electrical Stimulation   Electrical Stimulation Location Lt ankle   Electrical Stimulation Action IFC   Electrical Stimulation Parameters 1-10Hz    Electrical Stimulation Goals Edema     Vasopneumatic   Number Minutes Vasopneumatic  15 minutes   Vasopnuematic Location  Ankle   Vasopneumatic Pressure Medium   Vasopneumatic Temperature  3 flakes     Manual Therapy   Manual Therapy Other (comment)   Manual therapy comments retrograde massage Lt ankle     Ankle Exercises: Aerobic   Stationary Bike nustep x 5  minutes level 1     Ankle Exercises: Seated   BAPS Sitting;Level 1  20 reps   BAPS Limitations DF/PF and cw/ccw circles     Ankle Exercises: Supine   Other Supine Ankle Exercises ankle ROM 20x all directions                PT Education - 10/24/15 0959    Education provided Yes   Education Details continued to encourage pt to wear compression stocking for swelling   Person(s) Educated Patient   Methods Explanation   Comprehension Verbalized understanding          PT Short Term Goals - 10/24/15 1001      PT SHORT TERM GOAL #1   Title independent with initial HEP   Status On-going     PT SHORT TERM GOAL #2   Title ability to walk with a cane using the CAM boot with step through step pattern due to increased weightbear on the left   Status On-going     PT SHORT TERM GOAL #3   Title improve left ankle AROM >/= 5 degrees so she is able to ambulate with less pain.   Status On-going     PT SHORT TERM GOAL #4  Title swelling in left ankle decreases >/= 3 cm due to decreased edema   Status On-going           PT Long Term Goals - 10/24/15 1002      PT LONG TERM GOAL #1   Title independent with HEP   Status On-going     PT LONG TERM GOAL #2   Title ability to ambulate without an assistive device for 1000 feet so she is able to walk in the community with full weightbear on left   Status On-going     PT LONG TERM GOAL #3   Title left ankle AROM without normal limits so she is able to go up and down stairs with step over step pattern using  1 railing   Status On-going     PT LONG TERM GOAL #4   Title left ankle strength >/= 4/5 so she is able to return to pilates exercise class   Status On-going     PT LONG TERM GOAL #5   Title FOTO score is </= 48% limitation   Status On-going               Plan - 10/24/15 1000    Clinical Impression Statement Pt continues with increased edema, increased pain and decreased activity tolerance (difficulty  performing nu step due to UE fatigue).  Pt still hesitant with wt bearing with gait   Rehab Potential Excellent   PT Frequency 2x / week   PT Duration 8 weeks   PT Treatment/Interventions Cryotherapy;Electrical Stimulation;Functional mobility training;Stair training;Gait training;Ultrasound;Moist Heat;Therapeutic activities;Therapeutic exercise;Balance training;Neuromuscular re-education;Patient/family education;Passive range of motion;Manual techniques;Dry needling;Vasopneumatic Device   PT Next Visit Plan progress wt bearing as tolerated, AROM, edema control   Consulted and Agree with Plan of Care Patient      Patient will benefit from skilled therapeutic intervention in order to improve the following deficits and impairments:  Abnormal gait, Decreased range of motion, Difficulty walking, Increased fascial restricitons, Decreased endurance, Pain, Decreased activity tolerance, Decreased balance, Hypomobility, Impaired flexibility, Decreased strength, Decreased mobility, Increased edema  Visit Diagnosis: Muscle weakness (generalized)  Stiffness of left ankle, not elsewhere classified  Other abnormalities of gait and mobility  Localized edema     Problem List Patient Active Problem List   Diagnosis Date Noted  . Elevated BP 07/27/2014  . Bunion of left foot 07/26/2014  . Abnormality of gait 07/26/2014  . Cough 11/10/2013  . Post-nasal drainage 11/10/2013  . Abnormal urine odor 11/10/2013  . Visit for preventive health examination 07/28/2013  . Right anterior knee pain 07/22/2012  . Medicare annual wellness visit, subsequent 07/25/2011  . ANXIETY, SITUATIONAL 09/25/2009  . ARTHRITIS 09/25/2009  . VITAMIN D DEFICIENCY 09/27/2008  . HYPERLIPIDEMIA 09/17/2008  . Elevated blood pressure reading without diagnosis of hypertension 09/17/2008  . COLONIC POLYPS, HX OF 09/17/2008  . PERSONAL HISTORY DISEASES SKIN&SUBCUT TISSUE 09/17/2008    Reggy EyeKaren Yacoub Diltz, PT, DPT 10/24/2015, 10:03  AM  [facility] [address], [address] [place], [place], [address] Phone: [phone]   Fax:  [phone]  Name: Pamela ApleySandra Alvarez MRN: [id number] Date of Birth: 10/14/1940

## 2015-10-28 ENCOUNTER — Ambulatory Visit: Payer: Medicare Other | Admitting: Physical Therapy

## 2015-10-28 ENCOUNTER — Encounter: Payer: Self-pay | Admitting: Physical Therapy

## 2015-10-28 DIAGNOSIS — M25672 Stiffness of left ankle, not elsewhere classified: Secondary | ICD-10-CM

## 2015-10-28 DIAGNOSIS — M6281 Muscle weakness (generalized): Secondary | ICD-10-CM

## 2015-10-28 DIAGNOSIS — R2689 Other abnormalities of gait and mobility: Secondary | ICD-10-CM

## 2015-10-28 DIAGNOSIS — R600 Localized edema: Secondary | ICD-10-CM

## 2015-10-28 NOTE — Therapy (Signed)
[facility] [address], [address] [place], [place], [address] Phone: [phone]   Fax:  [phone]  Physical Therapy Treatment  Patient Details  Name: Pamela ApleySandra Alvarez MRN: [id number] Date of Birth: 06/13/1940 Referring Provider: Dr. Teryl LucyJoshua Landau  Encounter Date: 10/28/2015      PT End of Session - 10/28/15 1200    Visit Number 4   Number of Visits 10   Date for PT Re-Evaluation 12/17/15   Authorization Type medicare g-code at 15th visit   PT Start Time 1145   PT Stop Time 1235   PT Time Calculation (min) 50 min   Activity Tolerance Patient tolerated treatment well   Behavior During Therapy [facility] for tasks assessed/performed      Past Medical History:  Diagnosis Date  . Cyst 07-2008   Cyst on back x2 that were drained  . H/O echocardiogram 02-2000  . Hx of colonic polyps   . Hyperlipidemia     Past Surgical History:  Procedure Laterality Date  . POLYPECTOMY     Colon  . TONSILLECTOMY    . TUBAL LIGATION      There were no vitals filed for this visit.      Subjective Assessment - 10/28/15 1155    Subjective Wearing compression garment on LT LE and this is helping my swelling. My RT leg hurts more than my LT.   Currently in Pain? Yes   Pain Score 5    Pain Location Knee   Pain Orientation Right   Pain Descriptors / Indicators Sore   Aggravating Factors  Weight bearing on the RTLE more   Pain Relieving Factors Rest   Multiple Pain Sites No            [facility] PT Assessment - 10/28/15 0001      AROM   Left Ankle Dorsiflexion 0   Left Ankle Plantar Flexion 41   Left Ankle Inversion 10  With practice reps   Left Ankle Eversion 15  with practice reps     PROM   Left Ankle Dorsiflexion 5                     [facility] Adult PT Treatment/Exercise - 10/28/15 0001      Vasopneumatic   Number Minutes Vasopneumatic  15 minutes   Vasopnuematic Location  Ankle   Vasopneumatic Pressure Medium    Vasopneumatic Temperature  3 flakes     Manual Therapy   Manual Therapy --  PROM all directions   Manual therapy comments retrograde massage Lt ankle  Venous & lymph return facilitated     Ankle Exercises: Seated   Ankle Circles/Pumps AROM;Left;20 reps   Towel Inversion/Eversion --  20x each with tactile cuing for correct motion   BAPS Sitting;Level 3  20x each direction:  no circles, TC for control                  PT Short Term Goals - 10/24/15 1001      PT SHORT TERM GOAL #1   Title independent with initial HEP   Status On-going     PT SHORT TERM GOAL #2   Title ability to walk with a cane using the CAM boot with step through step pattern due to increased weightbear on the left   Status On-going     PT SHORT TERM GOAL #3   Title improve left ankle AROM >/= 5 degrees so she is able to ambulate with less  pain.   Status On-going     PT SHORT TERM GOAL #4   Title swelling in left ankle decreases >/= 3 cm due to decreased edema   Status On-going           PT Long Term Goals - 10/24/15 1002      PT LONG TERM GOAL #1   Title independent with HEP   Status On-going     PT LONG TERM GOAL #2   Title ability to ambulate without an assistive device for 1000 feet so she is able to walk in the community with full weightbear on left   Status On-going     PT LONG TERM GOAL #3   Title left ankle AROM without normal limits so she is able to go up and down stairs with step over step pattern using  1 railing   Status On-going     PT LONG TERM GOAL #4   Title left ankle strength >/= 4/5 so she is able to return to pilates exercise class   Status On-going     PT LONG TERM GOAL #5   Title FOTO score is </= 48% limitation   Status On-going               Plan - 10/28/15 1218    Clinical Impression Statement Pt gained 10 degrees in all motions with measurements today. Ambulating with RW 100% of the time. Pain really not problematic with ankle, more so in her  RT knee/leg.    Rehab Potential Excellent   Clinical Impairments Affecting Rehab Potential none   PT Frequency 2x / week   PT Duration 8 weeks   PT Treatment/Interventions Cryotherapy;Electrical Stimulation;Functional mobility training;Stair training;Gait training;Ultrasound;Moist Heat;Therapeutic activities;Therapeutic exercise;Balance training;Neuromuscular re-education;Patient/family education;Passive range of motion;Manual techniques;Dry needling;Vasopneumatic Device   PT Next Visit Plan BAPS, manual, edema management, work inversion/eversion, ambualte with cane some.   Consulted and Agree with Plan of Care Patient      Patient will benefit from skilled therapeutic intervention in order to improve the following deficits and impairments:  Abnormal gait, Decreased range of motion, Difficulty walking, Increased fascial restricitons, Decreased endurance, Pain, Decreased activity tolerance, Decreased balance, Hypomobility, Impaired flexibility, Decreased strength, Decreased mobility, Increased edema  Visit Diagnosis: Muscle weakness (generalized)  Stiffness of left ankle, not elsewhere classified  Other abnormalities of gait and mobility  Localized edema     Problem List Patient Active Problem List   Diagnosis Date Noted  . Elevated BP 07/27/2014  . Bunion of left foot 07/26/2014  . Abnormality of gait 07/26/2014  . Cough 11/10/2013  . Post-nasal drainage 11/10/2013  . Abnormal urine odor 11/10/2013  . Visit for preventive health examination 07/28/2013  . Right anterior knee pain 07/22/2012  . Medicare annual wellness visit, subsequent 07/25/2011  . ANXIETY, SITUATIONAL 09/25/2009  . ARTHRITIS 09/25/2009  . VITAMIN D DEFICIENCY 09/27/2008  . HYPERLIPIDEMIA 09/17/2008  . Elevated blood pressure reading without diagnosis of hypertension 09/17/2008  . COLONIC POLYPS, HX OF 09/17/2008  . PERSONAL HISTORY DISEASES SKIN&SUBCUT TISSUE 09/17/2008    COCHRAN,JENNIFER,  PTA 10/28/2015, 12:21 PM  [facility] [address], [address] [place], [place], [address] Phone: [phone]   Fax:  [phone]  Name: Pamela ApleySandra Alejandro MRN: [id number] Date of Birth: 10/05/1940

## 2015-10-30 ENCOUNTER — Ambulatory Visit: Payer: Medicare Other | Admitting: Physical Therapy

## 2015-10-30 ENCOUNTER — Encounter: Payer: Self-pay | Admitting: Physical Therapy

## 2015-10-30 DIAGNOSIS — R600 Localized edema: Secondary | ICD-10-CM

## 2015-10-30 DIAGNOSIS — R2689 Other abnormalities of gait and mobility: Secondary | ICD-10-CM

## 2015-10-30 DIAGNOSIS — M6281 Muscle weakness (generalized): Secondary | ICD-10-CM | POA: Diagnosis not present

## 2015-10-30 DIAGNOSIS — M25672 Stiffness of left ankle, not elsewhere classified: Secondary | ICD-10-CM

## 2015-10-30 NOTE — Therapy (Signed)
[facility] [address], [address] [place], [place], [address] Phone: [phone]   Fax:  [phone]  Physical Therapy Treatment  Patient Details  Name: Pamela Alvarez MRN: [id number] Date of Birth: 07/27/1940 Referring Provider: Dr. Teryl LucyJoshua Landau  Encounter Date: 10/30/2015      PT End of Session - 10/30/15 0853    Visit Number 5   Number of Visits 10   Date for PT Re-Evaluation 12/17/15   Authorization Type medicare g-code at 15th visit   PT Start Time 0845   PT Stop Time 0945   PT Time Calculation (min) 60 min   Activity Tolerance Patient tolerated treatment well   Behavior [facility] for tasks assessed/performed      Past Medical History:  Diagnosis Date  . Cyst 07-2008   Cyst on back x2 that were drained  . H/O echocardiogram 02-2000  . Hx of colonic polyps   . Hyperlipidemia     Past Surgical History:  Procedure Laterality Date  . POLYPECTOMY     Colon  . TONSILLECTOMY    . TUBAL LIGATION      There were no vitals filed for this visit.      Subjective Assessment - 10/30/15 0852    Subjective My ankle feels better than my right leg. Going to beach today for weekend.    Currently in Pain? No/denies   Multiple Pain Sites No                         [facility] Adult PT Treatment/Exercise - 10/30/15 0001      Vasopneumatic   Number Minutes Vasopneumatic  15 minutes   Vasopnuematic Location  Ankle   Vasopneumatic Pressure High   Vasopneumatic Temperature  3 flakes     Manual Therapy   Manual therapy comments retrograde massage Lt ankle and top of foot  Venous & lymph return facilitated     Ankle Exercises: Seated   Ankle Circles/Pumps AROM;Left;20 reps   BAPS Sitting;Level 3  20x each direction:  added circles with PTA , TC for control     Ankle Exercises: Aerobic   Stationary Bike Nustep x 6 min l1     Ankle Exercises: Supine   Other Supine Ankle Exercises ankle ROM 20x  all directions                  PT Short Term Goals - 10/30/15 0853      PT SHORT TERM GOAL #1   Title independent with initial HEP   Time 4   Period Weeks   Status Achieved     PT SHORT TERM GOAL #2   Title ability to walk with a cane using the CAM boot with step through step pattern due to increased weightbear on the left   Time 4   Period Weeks   Status On-going  Still using rolling walker     PT SHORT TERM GOAL #3   Title improve left ankle AROM >/= 5 degrees so she is able to ambulate with less pain.   Time 4   Period Weeks   Status Achieved           PT Long Term Goals - 10/24/15 1002      PT LONG TERM GOAL #1   Title independent with HEP   Status On-going     PT LONG TERM GOAL #2   Title ability to ambulate without an assistive device  for 1000 feet so she is able to walk in the community with full weightbear on left   Status On-going     PT LONG TERM GOAL #3   Title left ankle AROM without normal limits so she is able to go up and down stairs with step over step pattern using  1 railing   Status On-going     PT LONG TERM GOAL #4   Title left ankle strength >/= 4/5 so she is able to return to pilates exercise class   Status On-going     PT LONG TERM GOAL #5   Title FOTO score is </= 48% limitation   Status On-going               Plan - 10/30/15 0853    Clinical Impression Statement Pt meeting most STG's. Had more edema on top of her foot today today.    Rehab Potential Excellent   PT Frequency 2x / week   PT Duration 8 weeks   PT Treatment/Interventions Cryotherapy;Electrical Stimulation;Functional mobility training;Stair training;Gait training;Ultrasound;Moist Heat;Therapeutic activities;Therapeutic exercise;Balance training;Neuromuscular re-education;Patient/family education;Passive range of motion;Manual techniques;Dry needling;Vasopneumatic Device   PT Next Visit Plan BAPS, manual, edema management, work inversion/eversion,  ambualte with cane some.   Consulted and Agree with Plan of Care Patient      Patient will benefit from skilled therapeutic intervention in order to improve the following deficits and impairments:  Abnormal gait, Decreased range of motion, Difficulty walking, Increased fascial restricitons, Decreased endurance, Pain, Decreased activity tolerance, Decreased balance, Hypomobility, Impaired flexibility, Decreased strength, Decreased mobility, Increased edema  Visit Diagnosis: Muscle weakness (generalized)  Stiffness of left ankle, not elsewhere classified  Other abnormalities of gait and mobility  Localized edema     Problem List Patient Active Problem List   Diagnosis Date Noted  . Elevated BP 07/27/2014  . Bunion of left foot 07/26/2014  . Abnormality of gait 07/26/2014  . Cough 11/10/2013  . Post-nasal drainage 11/10/2013  . Abnormal urine odor 11/10/2013  . Visit for preventive health examination 07/28/2013  . Right anterior knee pain 07/22/2012  . Medicare annual wellness visit, subsequent 07/25/2011  . ANXIETY, SITUATIONAL 09/25/2009  . ARTHRITIS 09/25/2009  . VITAMIN D DEFICIENCY 09/27/2008  . HYPERLIPIDEMIA 09/17/2008  . Elevated blood pressure reading without diagnosis of hypertension 09/17/2008  . COLONIC POLYPS, HX OF 09/17/2008  . PERSONAL HISTORY DISEASES SKIN&SUBCUT TISSUE 09/17/2008    Yug Loria, PTA 10/30/2015, 9:29 AM  [facility] [address], [address] [place], [place], [address] Phone: [phone]   Fax:  [phone]  Name: Pamela Alvarez MRN: [id number] Date of Birth: 06/18/1940

## 2015-11-04 ENCOUNTER — Encounter: Payer: Self-pay | Admitting: Physical Therapy

## 2015-11-04 ENCOUNTER — Ambulatory Visit: Payer: Medicare Other | Admitting: Physical Therapy

## 2015-11-04 DIAGNOSIS — M6281 Muscle weakness (generalized): Secondary | ICD-10-CM

## 2015-11-04 DIAGNOSIS — M25672 Stiffness of left ankle, not elsewhere classified: Secondary | ICD-10-CM

## 2015-11-04 DIAGNOSIS — R600 Localized edema: Secondary | ICD-10-CM

## 2015-11-04 DIAGNOSIS — R2689 Other abnormalities of gait and mobility: Secondary | ICD-10-CM

## 2015-11-04 NOTE — Patient Instructions (Signed)
Calf Stretch    With towel around forefoot, keep knee straight and pull back on towel until a stretch is felt in the calf. Hold ___30_ seconds. Repeat __4__ times. Do __2__ sessions per day.  Copyright  VHI. All rights reserved.

## 2015-11-04 NOTE — Therapy (Addendum)
[facility] [address] [place], [place], [address] Phone: [phone]   Fax:  [phone]  Physical Therapy Treatment  Patient Details  Name: Pamela Alvarez MRN: [id number] Date of Birth: 06-10-1940 Referring Provider: Dr. Teryl Lucy  Encounter Date: 11/04/2015      PT End of Session - 11/04/15 0937    Visit Number 6   Number of Visits 10   Date for PT Re-Evaluation 12/17/15   Authorization Type medicare g-code at 15th visit   PT Start Time 0932   PT Stop Time 1030   PT Time Calculation (min) 58 min   Activity Tolerance Patient tolerated treatment well   Behavior During Therapy [facility] for tasks assessed/performed      Past Medical History:  Diagnosis Date  . Cyst 07-2008   Cyst on back x2 that were drained  . H/O echocardiogram 02-2000  . Hx of colonic polyps   . Hyperlipidemia     Past Surgical History:  Procedure Laterality Date  . POLYPECTOMY     Colon  . TONSILLECTOMY    . TUBAL LIGATION      There were no vitals filed for this visit.      Subjective Assessment - 11/04/15 0935    Subjective Ambulating with quad cane this AM. Pt requests HHA when walking. Did well at the beach, reports ankle burns.    Currently in Pain? Yes   Pain Score 5    Pain Location Ankle   Pain Orientation Left   Pain Descriptors / Indicators Burning   Aggravating Factors  if it gets hit.   Pain Relieving Factors Ice, rest   Multiple Pain Sites No            [facility] PT Assessment - 11/04/15 0001      Assessment   Medical Diagnosis S/P distal fibula fracture   Referring Provider Dr. Teryl Lucy   Onset Date/Surgical Date 09/11/15   Prior Therapy None     Precautions   Precautions None     Restrictions   Weight Bearing Restrictions No     Prior Function   Level of Independence Independent with basic ADLs   Vocation Retired   Leisure Office manager   Overall Cognitive Status Within  Functional Limits for tasks assessed     Observation/Other Assessments   Skin Integrity skin is intact but lots of dry skin shedding off left lower leg   Focus on Therapeutic Outcomes (FOTO)  75% limitation  48% limitation     AROM   Left Ankle Dorsiflexion 0   Left Ankle Plantar Flexion 41   Left Ankle Inversion 10  With practice reps   Left Ankle Eversion 15  with practice reps     PROM   Left Ankle Dorsiflexion 5                     [facility] Adult PT Treatment/Exercise - 11/04/15 0001      Ambulation/Gait   Ambulation/Gait Yes   Ambulation Distance (Feet) 80 Feet   Assistive device Small based quad cane   Gait Pattern Step-to pattern;Decreased arm swing - left   Ambulation Surface Level;Indoor   Gait Comments Close CGA     Vasopneumatic   Number Minutes Vasopneumatic  15 minutes   Vasopnuematic Location  Ankle   Vasopneumatic Pressure High   Vasopneumatic Temperature  3 flakes     Manual Therapy   Manual therapy comments  retrograde massage Lt ankle and top of foot  Venous & lymph return facilitated     Ankle Exercises: Seated   BAPS Sitting;Level 2  Did L2 trying to work on more independent work.   BAPS Limitations Still requires moderate asst with INV/EV   Other Seated Ankle Exercises Rocker board x 3 min seated     Ankle Exercises: Stretches   Gastroc Stretch 4 reps;60 seconds  Using sheet     Ankle Exercises: Aerobic   Stationary Bike Nustep x 6 min l1                PT Education - 11/04/15 0949    Education provided Yes   Education Details Towel stretch   Person(s) Educated Patient   Methods Demonstration;Explanation;Tactile cues;Verbal cues;Handout   Comprehension Verbalized understanding;Returned demonstration          PT Short Term Goals - 11/04/15 1015      PT SHORT TERM GOAL #2   Title ability to walk with a cane using the CAM boot with step through step pattern due to increased weightbear on the left   Time 4    Period Weeks   Status On-going  Step to with quad cane and close CGA      PT SHORT TERM GOAL #4   Title swelling in left ankle decreases >/= 3 cm due to decreased edema   Time 4   Period Weeks   Status On-going           PT Long Term Goals - 10/24/15 1002      PT LONG TERM GOAL #1   Title independent with HEP   Status On-going     PT LONG TERM GOAL #2   Title ability to ambulate without an assistive device for 1000 feet so she is able to walk in the community with full weightbear on left   Status On-going     PT LONG TERM GOAL #3   Title left ankle AROM without normal limits so she is able to go up and down stairs with step over step pattern using  1 railing   Status On-going     PT LONG TERM GOAL #4   Title left ankle strength >/= 4/5 so she is able to return to pilates exercise class   Status On-going     PT LONG TERM GOAL #5   Title FOTO score is </= 48% limitation   Status On-going               Plan - 11/04/15 1010    Clinical Impression Statement To MD next Wednesday. Continues to have edema in foot and LE. Worked on ambulating with quad cane properly, not using the HHA.  Pt had an overall feel of improved steadiness today.    Rehab Potential Excellent   Clinical Impairments Affecting Rehab Potential none   PT Frequency 3x / week   PT Duration 8 weeks   PT Treatment/Interventions Cryotherapy;Electrical Stimulation;Functional mobility training;Stair training;Gait training;Ultrasound;Moist Heat;Therapeutic activities;Therapeutic exercise;Balance training;Neuromuscular re-education;Patient/family education;Passive range of motion;Manual techniques;Dry needling;Vasopneumatic Device   PT Next Visit Plan BAPS, manual, edema management, work inversion/eversion, ambualte with cane some.   Consulted and Agree with Plan of Care Patient      Patient will benefit from skilled therapeutic intervention in order to improve the following deficits and impairments:   Abnormal gait, Decreased range of motion, Difficulty walking, Increased fascial restricitons, Decreased endurance, Pain, Decreased activity tolerance, Decreased balance, Hypomobility, Impaired flexibility, Decreased strength,  Decreased mobility, Increased edema  Visit Diagnosis: Muscle weakness (generalized) - Plan: PT plan of care cert/re-cert  Other abnormalities of gait and mobility - Plan: PT plan of care cert/re-cert  Stiffness of left ankle, not elsewhere classified - Plan: PT plan of care cert/re-cert  Localized edema - Plan: PT plan of care cert/re-cert     Problem List Patient Active Problem List   Diagnosis Date Noted  . Elevated BP 07/27/2014  . Bunion of left foot 07/26/2014  . Abnormality of gait 07/26/2014  . Cough 11/10/2013  . Post-nasal drainage 11/10/2013  . Abnormal urine odor 11/10/2013  . Visit for preventive health examination 07/28/2013  . Right anterior knee pain 07/22/2012  . Medicare annual wellness visit, subsequent 07/25/2011  . ANXIETY, SITUATIONAL 09/25/2009  . ARTHRITIS 09/25/2009  . VITAMIN D DEFICIENCY 09/27/2008  . HYPERLIPIDEMIA 09/17/2008  . Elevated blood pressure reading without diagnosis of hypertension 09/17/2008  . COLONIC POLYPS, HX OF 09/17/2008  . PERSONAL HISTORY DISEASES SKIN&SUBCUT TISSUE 09/17/2008    GRAY,CHERYL, PTA 11/04/2015, 3:07 PM Eulis Foster, PT 11/04/15 3:07 PM   [facility] [address] [place], [place], [address] Phone: [phone]   Fax:  [phone]  Name: Pamela Alvarez MRN: [id number] Date of Birth: 1940-03-27

## 2015-11-04 NOTE — Addendum Note (Signed)
Addended by: Eulis FosterGRAY, Ranson Belluomini F on: 11/04/2015 03:07 PM   Modules accepted: Orders

## 2015-11-06 ENCOUNTER — Encounter: Payer: Self-pay | Admitting: Physical Therapy

## 2015-11-06 ENCOUNTER — Ambulatory Visit: Payer: Medicare Other | Admitting: Physical Therapy

## 2015-11-06 DIAGNOSIS — R600 Localized edema: Secondary | ICD-10-CM

## 2015-11-06 DIAGNOSIS — M6281 Muscle weakness (generalized): Secondary | ICD-10-CM | POA: Diagnosis not present

## 2015-11-06 DIAGNOSIS — M25672 Stiffness of left ankle, not elsewhere classified: Secondary | ICD-10-CM

## 2015-11-06 DIAGNOSIS — R2689 Other abnormalities of gait and mobility: Secondary | ICD-10-CM

## 2015-11-06 NOTE — Therapy (Signed)
[facility] [address], [address] [place], [place], [address] Phone: [phone]   Fax:  [phone]  Physical Therapy Treatment  Patient Details  Name: Pamela ApleySandra Alvarez MRN: [id number] Date of Birth: 08/09/1940 Referring Provider: Dr. Teryl LucyJoshua Landau  Encounter Date: 11/06/2015      PT End of Session - 11/06/15 1108    Visit Number 7   Number of Visits 10   Date for PT Re-Evaluation 12/17/15   Authorization Type medicare g-code at 15th visit   PT Start Time 1100   PT Stop Time 1200   PT Time Calculation (min) 60 min   Activity Tolerance Patient tolerated treatment well   Behavior During Therapy [facility] for tasks assessed/performed      Past Medical History:  Diagnosis Date  . Cyst 07-2008   Cyst on back x2 that were drained  . H/O echocardiogram 02-2000  . Hx of colonic polyps   . Hyperlipidemia     Past Surgical History:  Procedure Laterality Date  . POLYPECTOMY     Colon  . TONSILLECTOMY    . TUBAL LIGATION      There were no vitals filed for this visit.      Subjective Assessment - 11/06/15 1107    Subjective Having more back pain than ankle pain. Ambulates with step to pattern. To MD next Wed.    Currently in Pain? No/denies   Multiple Pain Sites No                         [facility] Adult PT Treatment/Exercise - 11/06/15 0001      Vasopneumatic   Number Minutes Vasopneumatic  15 minutes   Vasopnuematic Location  Ankle   Vasopneumatic Pressure High   Vasopneumatic Temperature  3 flakes     Manual Therapy   Manual therapy comments retrograde massage Lt ankle and top of foot  Venous & lymph return facilitated     Ankle Exercises: Aerobic   Stationary Bike Nustep L2 x 8 min     Ankle Exercises: Seated   Towel Crunch --  Inversion/Eversion 10x each   BAPS Sitting;Level 2   BAPS Limitations Less assistance required today.  2x 10 each                PT Education - 11/06/15 1127     Education provided Yes   Education Details Towel exercise for inversion/eversion   Person(s) Educated Patient   Methods Explanation;Demonstration;Tactile cues;Verbal cues;Handout   Comprehension Verbalized understanding;Returned demonstration          PT Short Term Goals - 11/04/15 1015      PT SHORT TERM GOAL #2   Title ability to walk with a cane using the CAM boot with step through step pattern due to increased weightbear on the left   Time 4   Period Weeks   Status On-going  Step to with quad cane and close CGA      PT SHORT TERM GOAL #4   Title swelling in left ankle decreases >/= 3 cm due to decreased edema   Time 4   Period Weeks   Status On-going           PT Long Term Goals - 10/24/15 1002      PT LONG TERM GOAL #1   Title independent with HEP   Status On-going     PT LONG TERM GOAL #2   Title ability to ambulate without an  assistive device for 1000 feet so she is able to walk in the community with full weightbear on left   Status On-going     PT LONG TERM GOAL #3   Title left ankle AROM without normal limits so she is able to go up and down stairs with step over step pattern using  1 railing   Status On-going     PT LONG TERM GOAL #4   Title left ankle strength >/= 4/5 so she is able to return to pilates exercise class   Status On-going     PT LONG TERM GOAL #5   Title FOTO score is </= 48% limitation   Status On-going               Plan - 11/06/15 1143    Clinical Impression Statement Pt was able to demonstrate improved control with the BAPS board today with a larger ball. Added towel ex for inv/eversion for HEP advancement. Remains with edema in LTLE.    Clinical Impairments Affecting Rehab Potential none   PT Frequency 3x / week   PT Duration 8 weeks   PT Treatment/Interventions Cryotherapy;Electrical Stimulation;Functional mobility training;Stair training;Gait training;Ultrasound;Moist Heat;Therapeutic activities;Therapeutic  exercise;Balance training;Neuromuscular re-education;Patient/family education;Passive range of motion;Manual techniques;Dry needling;Vasopneumatic Device   PT Next Visit Plan BAPS, manual, edema management, work inversion/eversion, ambualte with cane some.   Consulted and Agree with Plan of Care Patient      Patient will benefit from skilled therapeutic intervention in order to improve the following deficits and impairments:  Abnormal gait, Decreased range of motion, Difficulty walking, Increased fascial restricitons, Decreased endurance, Pain, Decreased activity tolerance, Decreased balance, Hypomobility, Impaired flexibility, Decreased strength, Decreased mobility, Increased edema  Visit Diagnosis: Muscle weakness (generalized)  Other abnormalities of gait and mobility  Stiffness of left ankle, not elsewhere classified  Localized edema     Problem List Patient Active Problem List   Diagnosis Date Noted  . Elevated BP 07/27/2014  . Bunion of left foot 07/26/2014  . Abnormality of gait 07/26/2014  . Cough 11/10/2013  . Post-nasal drainage 11/10/2013  . Abnormal urine odor 11/10/2013  . Visit for preventive health examination 07/28/2013  . Right anterior knee pain 07/22/2012  . Medicare annual wellness visit, subsequent 07/25/2011  . ANXIETY, SITUATIONAL 09/25/2009  . ARTHRITIS 09/25/2009  . VITAMIN D DEFICIENCY 09/27/2008  . HYPERLIPIDEMIA 09/17/2008  . Elevated blood pressure reading without diagnosis of hypertension 09/17/2008  . COLONIC POLYPS, HX OF 09/17/2008  . PERSONAL HISTORY DISEASES SKIN&SUBCUT TISSUE 09/17/2008    COCHRAN,JENNIFER, PTA 11/06/2015, 11:46 AM   [facility] [address] [place], [place], [address] Phone: [phone]   Fax:  [phone]  Name: Pamela Alvarez MRN: [id number] Date of Birth: Feb 09, 1941

## 2015-11-06 NOTE — Patient Instructions (Signed)
Forefoot Invertors    Place right foot flat on towel, knee pointed forward. Pull  towel inward 10x, the outward 10x.  Do not allow hip or knee to move.  Do __1-2__ sessions per day. CAUTION: Repetitions should be slow and controlled.  Copyright  VHI. All rights reserved.

## 2015-11-08 ENCOUNTER — Ambulatory Visit: Payer: Medicare Other | Attending: Orthopedic Surgery | Admitting: Physical Therapy

## 2015-11-08 DIAGNOSIS — M6281 Muscle weakness (generalized): Secondary | ICD-10-CM | POA: Diagnosis present

## 2015-11-08 DIAGNOSIS — R2689 Other abnormalities of gait and mobility: Secondary | ICD-10-CM | POA: Diagnosis not present

## 2015-11-08 DIAGNOSIS — M25672 Stiffness of left ankle, not elsewhere classified: Secondary | ICD-10-CM | POA: Insufficient documentation

## 2015-11-08 DIAGNOSIS — R600 Localized edema: Secondary | ICD-10-CM

## 2015-11-08 NOTE — Therapy (Signed)
[facility] [address], [address] [place], [place], [address] Phone: [phone]   Fax:  [phone]  Physical Therapy Treatment  Patient Details  Name: Pamela ApleySandra Alvarez MRN: [id number] Date of Birth: 07/22/1940 Referring Provider: Dr. Teryl LucyJoshua Landau  Encounter Date: 11/08/2015      PT End of Session - 11/08/15 1149    Visit Number 8   Number of Visits 10   Date for PT Re-Evaluation 12/17/15   Authorization Type medicare g-code at 15th visit   PT Start Time 1100   PT Stop Time 1200   PT Time Calculation (min) 60 min      Past Medical History:  Diagnosis Date  . Cyst 07-2008   Cyst on back x2 that were drained  . H/O echocardiogram 02-2000  . Hx of colonic polyps   . Hyperlipidemia     Past Surgical History:  Procedure Laterality Date  . POLYPECTOMY     Colon  . TONSILLECTOMY    . TUBAL LIGATION      There were no vitals filed for this visit.          [facility] - 11/08/15 0001      Figure 8 Edema   Figure 8 - Left  56 cm     AROM   Left Ankle Dorsiflexion 2   Left Ankle Plantar Flexion 45                     [facility] Adult PT Treatment/Exercise - 11/08/15 0001      Manual Therapy   Manual therapy comments retrograde massage Lt ankle and top of foot  Venous & lymph return facilitated, PROM LT ankle     Ankle Exercises: Aerobic   Stationary Bike Nustep L2 x 8 min     Ankle Exercises: Seated   Heel Raises 20 reps   Toe Raise 20 reps   BAPS Sitting;Level 2   BAPS Limitations Less assistance required today.  2x 10 each, difficulty with eversion on board                  PT Short Term Goals - 11/04/15 1015      PT SHORT TERM GOAL #2   Title ability to walk with a cane using the CAM boot with step through step pattern due to increased weightbear on the left   Time 4   Period Weeks   Status On-going  Step to with quad cane and close CGA      PT SHORT TERM GOAL #4    Title swelling in left ankle decreases >/= 3 cm due to decreased edema   Time 4   Period Weeks   Status On-going           PT Long Term Goals - 10/24/15 1002      PT LONG TERM GOAL #1   Title independent with HEP   Status On-going     PT LONG TERM GOAL #2   Title ability to ambulate without an assistive device for 1000 feet so she is able to walk in the community with full weightbear on left   Status On-going     PT LONG TERM GOAL #3   Title left ankle AROM without normal limits so she is able to go up and down stairs with step over step pattern using  1 railing   Status On-going     PT LONG TERM GOAL #4   Title left  ankle strength >/= 4/5 so she is able to return to pilates exercise class   Status On-going     PT LONG TERM GOAL #5   Title FOTO score is </= 48% limitation   Status On-going               Plan - 11/08/15 1150    Clinical Impression Statement Pt's ankle ROM slightly better with measurements today. Plantarflexion increased more than dorsiflexion. Swelling effects this. pt has more confidence walking with her cane. Pt uses the cane 100% of the time now.  Questionable compliance with HEP.    Rehab Potential Excellent   Clinical Impairments Affecting Rehab Potential none   PT Frequency 3x / week   PT Duration 8 weeks   PT Treatment/Interventions Cryotherapy;Electrical Stimulation;Functional mobility training;Stair training;Gait training;Ultrasound;Moist Heat;Therapeutic activities;Therapeutic exercise;Balance training;Neuromuscular re-education;Patient/family education;Passive range of motion;Manual techniques;Dry needling;Vasopneumatic Device   PT Next Visit Plan BAPS, manual, edema management, work inversion/eversion, ambualte with cane some. To MD on Wednesday.   Consulted and Agree with Plan of Care Patient      Patient will benefit from skilled therapeutic intervention in order to improve the following deficits and impairments:  Abnormal gait,  Decreased range of motion, Difficulty walking, Increased fascial restricitons, Decreased endurance, Pain, Decreased activity tolerance, Decreased balance, Hypomobility, Impaired flexibility, Decreased strength, Decreased mobility, Increased edema  Visit Diagnosis: Other abnormalities of gait and mobility  Stiffness of left ankle, not elsewhere classified  Localized edema     Problem List Patient Active Problem List   Diagnosis Date Noted  . Elevated BP 07/27/2014  . Bunion of left foot 07/26/2014  . Abnormality of gait 07/26/2014  . Cough 11/10/2013  . Post-nasal drainage 11/10/2013  . Abnormal urine odor 11/10/2013  . Visit for preventive health examination 07/28/2013  . Right anterior knee pain 07/22/2012  . Medicare annual wellness visit, subsequent 07/25/2011  . ANXIETY, SITUATIONAL 09/25/2009  . ARTHRITIS 09/25/2009  . VITAMIN D DEFICIENCY 09/27/2008  . HYPERLIPIDEMIA 09/17/2008  . Elevated blood pressure reading without diagnosis of hypertension 09/17/2008  . COLONIC POLYPS, HX OF 09/17/2008  . PERSONAL HISTORY DISEASES SKIN&SUBCUT TISSUE 09/17/2008    [name], PTA 11/08/2015, 12:02 PM  [facility] [address] [place], [place], [address] Phone: [phone]   Fax:  [phone]  Name: Pamela Alvarez MRN: [id number] Date of Birth: June 15, 1940

## 2015-11-12 ENCOUNTER — Ambulatory Visit: Payer: Medicare Other | Admitting: Physical Therapy

## 2015-11-12 DIAGNOSIS — R2689 Other abnormalities of gait and mobility: Secondary | ICD-10-CM | POA: Diagnosis not present

## 2015-11-12 DIAGNOSIS — M25672 Stiffness of left ankle, not elsewhere classified: Secondary | ICD-10-CM

## 2015-11-12 DIAGNOSIS — R600 Localized edema: Secondary | ICD-10-CM

## 2015-11-12 DIAGNOSIS — M6281 Muscle weakness (generalized): Secondary | ICD-10-CM

## 2015-11-12 NOTE — Therapy (Signed)
[facility] [address] [place], [place], [address] Phone: [phone]   Fax:  [phone]  Physical Therapy Treatment  Patient Details  Name: Pamela Alvarez MRN: [id number] Date of Birth: 1940-05-13 Referring Provider: Dr. Teryl Lucy  Encounter Date: 11/12/2015      PT End of Session - 11/12/15 1157    Visit Number 9   Number of Visits 10   Date for PT Re-Evaluation 12/17/15   Authorization Type medicare g-code at 15th visit   PT Start Time 1100   PT Stop Time 1145   PT Time Calculation (min) 45 min   Activity Tolerance Patient tolerated treatment well      Past Medical History:  Diagnosis Date  . Cyst 07-2008   Cyst on back x2 that were drained  . H/O echocardiogram 02-2000  . Hx of colonic polyps   . Hyperlipidemia     Past Surgical History:  Procedure Laterality Date  . POLYPECTOMY     Colon  . TONSILLECTOMY    . TUBAL LIGATION      There were no vitals filed for this visit.      Subjective Assessment - 11/12/15 1058    Subjective Arrives without SPC, husband states she left the cane in the car and needs steadying.  Wearing walking boot.  Patient reports difficulty sleeping b/c of her knees.  I can go up and down stairs with help.  I can walk without the walker now (uses cane ).  "My husband is always there to help me."     Currently in Pain? Yes   Pain Score 4    Pain Location Knee   Pain Orientation Right;Left   Pain Type Acute pain   Aggravating Factors  night time when I try to lie on my side   Pain Relieving Factors be able to do things myself            [facility] PT Assessment - 11/12/15 0001      Observation/Other Assessments-Edema    Edema --  54.5 cm     AROM   Left Ankle Dorsiflexion 2   Left Ankle Plantar Flexion 45   Left Ankle Eversion 15     Strength   Left Ankle Dorsiflexion 2-/5   Left Ankle Plantar Flexion 2-/5   Left Ankle Inversion 2-/5   Left Ankle Eversion  2-/5                     [facility] Adult PT Treatment/Exercise - 11/12/15 0001      Manual Therapy   Manual Therapy Passive ROM;Other (comment)   Manual therapy comments retrograde massage Lt ankle and top of foot  Venous & lymph return facilitated, PROM LT ankle   Passive ROM Toe flexion and extension   Other Manual Therapy manual isometrics  5 sec holds 5x each     Ankle Exercises: Seated   Towel Inversion/Eversion 5 reps  towel slides    Heel Raises 20 reps   Toe Raise 20 reps   BAPS Sitting;Level 2   BAPS Limitations Less assistance required today.  2x 10 each, difficulty with eversion on board   Other Seated Ankle Exercises towel scrunches for intrinsic 8x                  PT Short Term Goals - 11/12/15 2101      PT SHORT TERM GOAL #1   Title independent with initial HEP  Status Achieved     PT SHORT TERM GOAL #2   Title ability to walk with a cane using the CAM boot with step through step pattern due to increased weightbear on the left   Time 4   Period Weeks   Status On-going     PT SHORT TERM GOAL #3   Title improve left ankle AROM >/= 5 degrees so she is able to ambulate with less pain.   Status Achieved     PT SHORT TERM GOAL #4   Title swelling in left ankle decreases >/= 3 cm due to decreased edema   Status Achieved           PT Long Term Goals - 11/12/15 2102      PT LONG TERM GOAL #1   Title independent with HEP   Time 8   Period Weeks   Status On-going     PT LONG TERM GOAL #2   Title ability to ambulate without an assistive device for 1000 feet so she is able to walk in the community with full weightbear on left   Time 8   Period Weeks   Status On-going     PT LONG TERM GOAL #3   Title left ankle AROM without normal limits so she is able to go up and down stairs with step over step pattern using  1 railing   Time 8   Period Weeks   Status On-going     PT LONG TERM GOAL #4   Title left ankle strength >/= 4/5 so  she is able to return to pilates exercise class   Time 8   Period Weeks   Status On-going     PT LONG TERM GOAL #5   Title FOTO score is </= 48% limitation   Time 8   Period Weeks   Status On-going               Plan - 11/12/15 1158    Clinical Impression Statement No significant changes in AROM since last measurements 4 days ago.  Improving muscle activation in all directions but still poor strength in ankle and toe intrinsics.  Significant improvement in edema with reduction Fig 8 girth.  She continues to be at higher risk of falls.  The patient hopes her walking boot will be discontinued by the doctor on tomorrow's visit.  Therapist closely monitoring response to all interventions and providing hand held assist to ambulate since she forgot her cane.     PT Next Visit Plan BAPS, manual, edema management, work inversion/eversion, ambulate with cane;  see how MD appt went      Patient will benefit from skilled therapeutic intervention in order to improve the following deficits and impairments:     Visit Diagnosis: Other abnormalities of gait and mobility  Stiffness of left ankle, not elsewhere classified  Localized edema  Muscle weakness (generalized)     Problem List Patient Active Problem List   Diagnosis Date Noted  . Elevated BP 07/27/2014  . Bunion of left foot 07/26/2014  . Abnormality of gait 07/26/2014  . Cough 11/10/2013  . Post-nasal drainage 11/10/2013  . Abnormal urine odor 11/10/2013  . Visit for preventive health examination 07/28/2013  . Right anterior knee pain 07/22/2012  . Medicare annual wellness visit, subsequent 07/25/2011  . ANXIETY, SITUATIONAL 09/25/2009  . ARTHRITIS 09/25/2009  . VITAMIN D DEFICIENCY 09/27/2008  . HYPERLIPIDEMIA 09/17/2008  . Elevated blood pressure reading without diagnosis of hypertension 09/17/2008  .  COLONIC POLYPS, HX OF 09/17/2008  . PERSONAL HISTORY DISEASES SKIN&SUBCUT TISSUE 09/17/2008   Lavinia Sharps,  PT 11/12/15 9:04 PM Phone: [phone] Fax: [phone] Vivien Presto 11/12/2015, 9:03 PM  [facility] [address], [address] [place], [place], [address] Phone: [phone]   Fax:  [phone]  Name: Pamela Alvarez MRN: [id number] Date of Birth: 05-27-1940

## 2015-11-15 ENCOUNTER — Ambulatory Visit: Payer: Medicare Other | Admitting: Physical Therapy

## 2015-11-15 ENCOUNTER — Encounter: Payer: Self-pay | Admitting: Physical Therapy

## 2015-11-15 DIAGNOSIS — M6281 Muscle weakness (generalized): Secondary | ICD-10-CM

## 2015-11-15 DIAGNOSIS — M25672 Stiffness of left ankle, not elsewhere classified: Secondary | ICD-10-CM

## 2015-11-15 DIAGNOSIS — R2689 Other abnormalities of gait and mobility: Secondary | ICD-10-CM

## 2015-11-15 DIAGNOSIS — R600 Localized edema: Secondary | ICD-10-CM

## 2015-11-15 NOTE — Therapy (Signed)
[facility] [address], [address] [place], [place], [address] Phone: [phone]   Fax:  [phone]  Physical Therapy Treatment  Patient Details  Name: Pamela ApleySandra Alvarez MRN: [id number] Date of Birth: 11/21/1940 Referring Provider: Dr. Teryl LucyJoshua Landau  Encounter Date: 11/15/2015      PT End of Session - 11/15/15 0856    Visit Number 10   Date for PT Re-Evaluation 12/17/15   Authorization Type medicare g-code at 15th visit   PT Start Time 0850   PT Stop Time 0945   PT Time Calculation (min) 55 min   Equipment Utilized During Treatment --  Lt ankle ASO   Activity Tolerance Patient tolerated treatment well   Behavior During Therapy [facility] for tasks assessed/performed      Past Medical History:  Diagnosis Date  . Cyst 07-2008   Cyst on back x2 that were drained  . H/O echocardiogram 02-2000  . Hx of colonic polyps   . Hyperlipidemia     Past Surgical History:  Procedure Laterality Date  . POLYPECTOMY     Colon  . TONSILLECTOMY    . TUBAL LIGATION      There were no vitals filed for this visit.      Subjective Assessment - 11/15/15 [id number]    Subjective MD took pt out of CAM boot and now has a ASO brace for walking. MD commented to pt that swelling may persist. Will return to MD in 1 month.  No restrictions.    Currently in Pain? Yes   Pain Score 4    Pain Location Knee   Pain Orientation Right;Left   Pain Descriptors / Indicators Aching   Multiple Pain Sites No            [facility] PT Assessment - 11/15/15 0001      Observation/Other Assessments   Focus on Therapeutic Outcomes (FOTO)  64% limited CL: Goal is CK                     [facility] Adult PT Treatment/Exercise - 11/15/15 0001      Ambulation/Gait   Ambulation/Gait Yes   Ambulation/Gait Assistance 6: Modified independent (Device/Increase time)   Ambulation Distance (Feet) 60 Feet   Assistive device Small based quad cane   Gait Pattern Step-to  pattern;Step-through pattern   Ambulation Surface Level   Gait Comments Working towards increasing her step though     Vasopneumatic   Number Minutes Vasopneumatic  15 minutes   Vasopnuematic Location  Ankle   Vasopneumatic Pressure High   Vasopneumatic Temperature  3 flakes     Manual Therapy   Manual Therapy Passive ROM;Other (comment)   Manual therapy comments retrograde massage Lt ankle and top of foot  Venous & lymph return facilitated, PROM LT ankle   Passive ROM Toe flexion and extension   Other Manual Therapy manual isometrics  5 sec holds 5x each     Ankle Exercises: Seated   BAPS Sitting;Level 2   Other Seated Ankle Exercises yellow band DF/PF 2x10, manually resisted INV/EV 2x 10     Ankle Exercises: Aerobic   Stationary Bike Nustep L2 x 8 min                  PT Short Term Goals - 11/12/15 2101      PT SHORT TERM GOAL #1   Title independent with initial HEP   Status Achieved     PT SHORT TERM GOAL #2  Title ability to walk with a cane using the CAM boot with step through step pattern due to increased weightbear on the left   Time 4   Period Weeks   Status On-going     PT SHORT TERM GOAL #3   Title improve left ankle AROM >/= 5 degrees so she is able to ambulate with less pain.   Status Achieved     PT SHORT TERM GOAL #4   Title swelling in left ankle decreases >/= 3 cm due to decreased edema   Status Achieved           PT Long Term Goals - 11/12/15 2102      PT LONG TERM GOAL #1   Title independent with HEP   Time 8   Period Weeks   Status On-going     PT LONG TERM GOAL #2   Title ability to ambulate without an assistive device for 1000 feet so she is able to walk in the community with full weightbear on left   Time 8   Period Weeks   Status On-going     PT LONG TERM GOAL #3   Title left ankle AROM without normal limits so she is able to go up and down stairs with step over step pattern using  1 railing   Time 8   Period Weeks    Status On-going     PT LONG TERM GOAL #4   Title left ankle strength >/= 4/5 so she is able to return to pilates exercise class   Time 8   Period Weeks   Status On-going     PT LONG TERM GOAL #5   Title FOTO score is </= 48% limitation   Time 8   Period Weeks   Status On-going               Plan - 11/15/15 0930    Clinical Impression Statement Pt saw MD and is now out of her CAM boot and wearing an ASO boot. Pt ambulates with step to pattern due to fear of falling and weakness thoroughout the LTLE. She improved as we practiced more OF A SWING THORUGH pattern today. Added some resistance today with yellow band.    Rehab Potential Excellent   Clinical Impairments Affecting Rehab Potential none   PT Frequency 3x / week   PT Duration 8 weeks   PT Treatment/Interventions Cryotherapy;Electrical Stimulation;Functional mobility training;Stair training;Gait training;Ultrasound;Moist Heat;Therapeutic activities;Therapeutic exercise;Balance training;Neuromuscular re-education;Patient/family education;Passive range of motion;Manual techniques;Dry needling;Vasopneumatic Device   PT Next Visit Plan Ankle ROM, strength, manual techniques for edema and ROM, gait   Consulted and Agree with Plan of Care Patient        G code:  Mobility walking and moving around:  Current CL                                                                            Goal CK   Patient will benefit from skilled therapeutic intervention in order to improve the following deficits and impairments:  Abnormal gait, Decreased range of motion, Difficulty walking, Increased fascial restricitons, Decreased endurance, Pain, Decreased activity tolerance, Decreased balance, Hypomobility, Impaired flexibility, Decreased strength, Decreased mobility, Increased edema  Visit Diagnosis: Other abnormalities of gait and mobility  Stiffness of left ankle, not elsewhere classified  Localized edema  Muscle weakness  (generalized)     Problem List Patient Active Problem List   Diagnosis Date Noted  . Elevated BP 07/27/2014  . Bunion of left foot 07/26/2014  . Abnormality of gait 07/26/2014  . Cough 11/10/2013  . Post-nasal drainage 11/10/2013  . Abnormal urine odor 11/10/2013  . Visit for preventive health examination 07/28/2013  . Right anterior knee pain 07/22/2012  . Medicare annual wellness visit, subsequent 07/25/2011  . ANXIETY, SITUATIONAL 09/25/2009  . ARTHRITIS 09/25/2009  . VITAMIN D DEFICIENCY 09/27/2008  . HYPERLIPIDEMIA 09/17/2008  . Elevated blood pressure reading without diagnosis of hypertension 09/17/2008  . COLONIC POLYPS, HX OF 09/17/2008  . PERSONAL HISTORY DISEASES SKIN&SUBCUT TISSUE 09/17/2008   Lavinia Sharps, PT 11/15/15 12:14 PM Phone: [phone] Fax: [phone]  Ane Payment, PTA 11/15/15 11:56 AM   [facility] [address] [place], [place], [address] Phone: [phone]   Fax:  [phone]  Name: Pamela Alvarez MRN: [id number] Date of Birth: October 03, 1940

## 2015-11-18 ENCOUNTER — Encounter: Payer: Self-pay | Admitting: Physical Therapy

## 2015-11-18 ENCOUNTER — Ambulatory Visit: Payer: Medicare Other | Admitting: Physical Therapy

## 2015-11-18 DIAGNOSIS — M6281 Muscle weakness (generalized): Secondary | ICD-10-CM

## 2015-11-18 DIAGNOSIS — R2689 Other abnormalities of gait and mobility: Secondary | ICD-10-CM | POA: Diagnosis not present

## 2015-11-18 DIAGNOSIS — R600 Localized edema: Secondary | ICD-10-CM

## 2015-11-18 DIAGNOSIS — M25672 Stiffness of left ankle, not elsewhere classified: Secondary | ICD-10-CM

## 2015-11-18 NOTE — Therapy (Signed)
[facility] [address], [address] [place], [place], [address] Phone: [phone]   Fax:  [phone]  Physical Therapy Treatment  Patient Details  Name: Pamela ApleySandra Alvarez MRN: [id number] Date of Birth: 06/30/1940 Referring Provider: Dr. Teryl LucyJoshua Landau  Encounter Date: 11/18/2015      PT End of Session - 11/18/15 1454    Visit Number 11   Date for PT Re-Evaluation 12/17/15   Authorization Type medicare g-code at 15th visit   PT Start Time 1447   PT Stop Time 1545   PT Time Calculation (min) 58 min   Activity Tolerance Patient tolerated treatment well   Behavior During Therapy [facility] for tasks assessed/performed      Past Medical History:  Diagnosis Date  . Cyst 07-2008   Cyst on back x2 that were drained  . H/O echocardiogram 02-2000  . Hx of colonic polyps   . Hyperlipidemia     Past Surgical History:  Procedure Laterality Date  . POLYPECTOMY     Colon  . TONSILLECTOMY    . TUBAL LIGATION      There were no vitals filed for this visit.      Subjective Assessment - 11/18/15 1452    Subjective My ankle is not bothering me at all. My knees hurt me more. My foot is very swollen today.    Currently in Pain? Yes   Pain Score 4    Pain Location Knee   Pain Orientation Right;Left   Pain Descriptors / Indicators Aching   Aggravating Factors  Night time   Pain Relieving Factors Heat   Multiple Pain Sites No            [facility] PT Assessment - 11/18/15 0001      AROM   Left Ankle Dorsiflexion 4     PROM   Left Ankle Dorsiflexion 5                     [facility] Adult PT Treatment/Exercise - 11/18/15 0001      Vasopneumatic   Number Minutes Vasopneumatic  15 minutes   Vasopnuematic Location  Ankle   Vasopneumatic Pressure High   Vasopneumatic Temperature  3 flakes     Manual Therapy   Manual Therapy Passive ROM;Other (comment)   Passive ROM Toe flexion and extension     Ankle Exercises: Aerobic   Stationary Bike L2 x 10 min     Ankle Exercises: Stretches   Slant Board Stretch 3 reps;20 seconds     Ankle Exercises: Standing   Other Standing Ankle Exercises Tandem stance bil 15 sec   Other Standing Ankle Exercises weight shifting on mini tramp 1 min 3 ways     Ankle Exercises: Supine   T-Band 4 ways 2x 10 yellow band                  PT Short Term Goals - 11/18/15 1454      PT SHORT TERM GOAL #2   Title ability to walk with a cane using the CAM boot with step through step pattern due to increased weightbear on the left   Time 4   Period Weeks   Status On-going  Improving           PT Long Term Goals - 11/12/15 2102      PT LONG TERM GOAL #1   Title independent with HEP   Time 8   Period Weeks   Status On-going  PT LONG TERM GOAL #2   Title ability to ambulate without an assistive device for 1000 feet so she is able to walk in the community with full weightbear on left   Time 8   Period Weeks   Status On-going     PT LONG TERM GOAL #3   Title left ankle AROM without normal limits so she is able to go up and down stairs with step over step pattern using  1 railing   Time 8   Period Weeks   Status On-going     PT LONG TERM GOAL #4   Title left ankle strength >/= 4/5 so she is able to return to pilates exercise class   Time 8   Period Weeks   Status On-going     PT LONG TERM GOAL #5   Title FOTO score is </= 48% limitation   Time 8   Period Weeks   Status On-going               Plan - 11/18/15 1454    Clinical Impression Statement Pt's gait is improving in regards to her swing through phase. Was able to do yellow tband in all 4 directions today with improved facilitation of inversion and eversion. Despite verbal cues from pt that swelling was bad, upon inspection the ankle looked only mildly swollen. Pain is mainly in her knees. and especially at night.    Rehab Potential Excellent   Clinical Impairments Affecting Rehab Potential  none   PT Frequency 3x / week   PT Duration 8 weeks   PT Treatment/Interventions Cryotherapy;Electrical Stimulation;Functional mobility training;Stair training;Gait training;Ultrasound;Moist Heat;Therapeutic activities;Therapeutic exercise;Balance training;Neuromuscular re-education;Patient/family education;Passive range of motion;Manual techniques;Dry needling;Vasopneumatic Device   PT Next Visit Plan Ankle/toes ROM, ankle strength and stability,    Consulted and Agree with Plan of Care Patient      Patient will benefit from skilled therapeutic intervention in order to improve the following deficits and impairments:  Abnormal gait, Decreased range of motion, Difficulty walking, Increased fascial restricitons, Decreased endurance, Pain, Decreased activity tolerance, Decreased balance, Hypomobility, Impaired flexibility, Decreased strength, Decreased mobility, Increased edema  Visit Diagnosis: Other abnormalities of gait and mobility  Stiffness of left ankle, not elsewhere classified  Localized edema  Muscle weakness (generalized)     Problem List Patient Active Problem List   Diagnosis Date Noted  . Elevated BP 07/27/2014  . Bunion of left foot 07/26/2014  . Abnormality of gait 07/26/2014  . Cough 11/10/2013  . Post-nasal drainage 11/10/2013  . Abnormal urine odor 11/10/2013  . Visit for preventive health examination 07/28/2013  . Right anterior knee pain 07/22/2012  . Medicare annual wellness visit, subsequent 07/25/2011  . ANXIETY, SITUATIONAL 09/25/2009  . ARTHRITIS 09/25/2009  . VITAMIN D DEFICIENCY 09/27/2008  . HYPERLIPIDEMIA 09/17/2008  . Elevated blood pressure reading without diagnosis of hypertension 09/17/2008  . COLONIC POLYPS, HX OF 09/17/2008  . PERSONAL HISTORY DISEASES SKIN&SUBCUT TISSUE 09/17/2008    Pamela Alvarez, PTA 11/18/2015, 3:32 PM  [facility] [address], [address] [place], [place],  [address] Phone: [phone]   Fax:  [phone]  Name: Pamela Alvarez MRN: [id number] Date of Birth: [birth year]

## 2015-11-20 ENCOUNTER — Encounter: Payer: Self-pay | Admitting: Physical Therapy

## 2015-11-20 ENCOUNTER — Ambulatory Visit: Payer: Medicare Other | Admitting: Physical Therapy

## 2015-11-20 DIAGNOSIS — R2689 Other abnormalities of gait and mobility: Secondary | ICD-10-CM

## 2015-11-20 DIAGNOSIS — M25672 Stiffness of left ankle, not elsewhere classified: Secondary | ICD-10-CM

## 2015-11-20 DIAGNOSIS — R600 Localized edema: Secondary | ICD-10-CM

## 2015-11-20 DIAGNOSIS — M6281 Muscle weakness (generalized): Secondary | ICD-10-CM

## 2015-11-20 NOTE — Therapy (Signed)
[facility] [address], [address] [place], [place], [address] Phone: [phone]   Fax:  [phone]  Physical Therapy Treatment  Patient Details  Name: Pamela ApleySandra Alvarez MRN: [id number] Date of Birth: 08/10/1940 Referring Provider: Dr. Teryl LucyJoshua Landau  Encounter Date: 11/20/2015      PT End of Session - 11/20/15 1144    Visit Number 12   Date for PT Re-Evaluation 12/17/15   Authorization Type medicare g-code at 15th visit   PT Start Time 1139   PT Stop Time 1245   PT Time Calculation (min) 66 min   Equipment Utilized During Treatment --  PT DOES NOT NEED BRACE FOR TX.    Activity Tolerance Patient tolerated treatment well   Behavior During Therapy [facility] for tasks assessed/performed      Past Medical History:  Diagnosis Date  . Cyst 07-2008   Cyst on back x2 that were drained  . H/O echocardiogram 02-2000  . Hx of colonic polyps   . Hyperlipidemia     Past Surgical History:  Procedure Laterality Date  . POLYPECTOMY     Colon  . TONSILLECTOMY    . TUBAL LIGATION      There were no vitals filed for this visit.      Subjective Assessment - 11/20/15 1143    Subjective My knees have done better the last two nights. I feel unsteady today.    Currently in Pain? No/denies   Multiple Pain Sites No                         [facility] Adult PT Treatment/Exercise - 11/20/15 0001      Ambulation/Gait   Ambulation/Gait Yes   Ambulation/Gait Assistance 6: Modified independent (Device/Increase time)   Ambulation Distance (Feet) 100 Feet   Assistive device Straight cane   Gait Pattern Step-through pattern;Decreased arm swing - left;Decreased dorsiflexion - left   Ambulation Surface Level     Vasopneumatic   Number Minutes Vasopneumatic  15 minutes   Vasopnuematic Location  Ankle   Vasopneumatic Pressure High   Vasopneumatic Temperature  3 flakes     Manual Therapy   Manual Therapy Passive ROM;Other (comment)    Passive ROM Toe flexion and extension     Ankle Exercises: Aerobic   Stationary Bike L2 x 10 min     Ankle Exercises: Stretches   Slant Board Stretch 3 reps;20 seconds     Ankle Exercises: Standing   Other Standing Ankle Exercises Tandem stance bil 15 sec   Other Standing Ankle Exercises weight shifting on mini tramp 1 min 3 ways     Ankle Exercises: Seated   Heel Raises 20 reps     Ankle Exercises: Supine   T-Band 4 ways red 2x 10 each                  PT Short Term Goals - 11/18/15 1454      PT SHORT TERM GOAL #2   Title ability to walk with a cane using the CAM boot with step through step pattern due to increased weightbear on the left   Time 4   Period Weeks   Status On-going  Improving           PT Long Term Goals - 11/12/15 2102      PT LONG TERM GOAL #1   Title independent with HEP   Time 8   Period Weeks   Status On-going  PT LONG TERM GOAL #2   Title ability to ambulate without an assistive device for 1000 feet so she is able to walk in the community with full weightbear on left   Time 8   Period Weeks   Status On-going     PT LONG TERM GOAL #3   Title left ankle AROM without normal limits so she is able to go up and down stairs with step over step pattern using  1 railing   Time 8   Period Weeks   Status On-going     PT LONG TERM GOAL #4   Title left ankle strength >/= 4/5 so she is able to return to pilates exercise class   Time 8   Period Weeks   Status On-going     PT LONG TERM GOAL #5   Title FOTO score is </= 48% limitation   Time 8   Period Weeks   Status On-going               Plan - 11/20/15 1145    Clinical Impression Statement Pt admitts she has a pretty significant fear of falling which makes her take smaller steps  look down. She frequently gets caught up with two of the legs on the quad cane. We practiced with the straight cane today and she seemed to do better. She had an easier time handling the cane  and was able to increase her step length. Sweeling was mild-moderate today. No increased pain with standinging exercises this week. She is able to reach 5 degrees of active dorsiflexion today after manual work.    Rehab Potential Excellent   Clinical Impairments Affecting Rehab Potential none   PT Frequency 3x / week   PT Duration 8 weeks   PT Treatment/Interventions Cryotherapy;Electrical Stimulation;Functional mobility training;Stair training;Gait training;Ultrasound;Moist Heat;Therapeutic activities;Therapeutic exercise;Balance training;Neuromuscular re-education;Patient/family education;Passive range of motion;Manual techniques;Dry needling;Vasopneumatic Device   PT Next Visit Plan Ankle/toe ROm, ankle strength/stabilization exs, balance ex to increase confidence, consider the straight cane vs quad cane.    Consulted and Agree with Plan of Care Patient      Patient will benefit from skilled therapeutic intervention in order to improve the following deficits and impairments:  Abnormal gait, Decreased range of motion, Difficulty walking, Increased fascial restricitons, Decreased endurance, Pain, Decreased activity tolerance, Decreased balance, Hypomobility, Impaired flexibility, Decreased strength, Decreased mobility, Increased edema  Visit Diagnosis: Other abnormalities of gait and mobility  Stiffness of left ankle, not elsewhere classified  Localized edema  Muscle weakness (generalized)     Problem List Patient Active Problem List   Diagnosis Date Noted  . Elevated BP 07/27/2014  . Bunion of left foot 07/26/2014  . Abnormality of gait 07/26/2014  . Cough 11/10/2013  . Post-nasal drainage 11/10/2013  . Abnormal urine odor 11/10/2013  . Visit for preventive health examination 07/28/2013  . Right anterior knee pain 07/22/2012  . Medicare annual wellness visit, subsequent 07/25/2011  . ANXIETY, SITUATIONAL 09/25/2009  . ARTHRITIS 09/25/2009  . VITAMIN D DEFICIENCY 09/27/2008   . HYPERLIPIDEMIA 09/17/2008  . Elevated blood pressure reading without diagnosis of hypertension 09/17/2008  . COLONIC POLYPS, HX OF 09/17/2008  . PERSONAL HISTORY DISEASES SKIN&SUBCUT TISSUE 09/17/2008    Bridget  11/20/2015, 12:35 PM  [facility] [address], [address] [place], [place], [address] Phone: [phone]   Fax:  [phone]  Name: Rania Prothero MRN: [id number] Date of Birth: 1940/10/23

## 2015-11-22 ENCOUNTER — Encounter: Payer: Self-pay | Admitting: Physical Therapy

## 2015-11-22 ENCOUNTER — Ambulatory Visit: Payer: Medicare Other | Admitting: Physical Therapy

## 2015-11-22 DIAGNOSIS — M6281 Muscle weakness (generalized): Secondary | ICD-10-CM

## 2015-11-22 DIAGNOSIS — M25672 Stiffness of left ankle, not elsewhere classified: Secondary | ICD-10-CM

## 2015-11-22 DIAGNOSIS — R600 Localized edema: Secondary | ICD-10-CM

## 2015-11-22 DIAGNOSIS — R2689 Other abnormalities of gait and mobility: Secondary | ICD-10-CM

## 2015-11-22 NOTE — Therapy (Signed)
[facility] [address], [address] [place], [place], [address] Phone: [phone]   Fax:  [phone]  Physical Therapy Treatment  Patient Details  Name: Pamela ApleySandra Kellison MRN: [id number] Date of Birth: 02/14/1941 Referring Provider: Dr. Teryl LucyJoshua Landau  Encounter Date: 11/22/2015      PT End of Session - 11/22/15 0859    Visit Number 13   Date for PT Re-Evaluation 12/17/15   Authorization Type medicare g-code at 15th visit   PT Start Time 0846   PT Stop Time 0945   PT Time Calculation (min) 59 min   Activity Tolerance Patient tolerated treatment well   Behavior During Therapy [facility] for tasks assessed/performed      Past Medical History:  Diagnosis Date  . Cyst 07-2008   Cyst on back x2 that were drained  . H/O echocardiogram 02-2000  . Hx of colonic polyps   . Hyperlipidemia     Past Surgical History:  Procedure Laterality Date  . POLYPECTOMY     Colon  . TONSILLECTOMY    . TUBAL LIGATION      There were no vitals filed for this visit.      Subjective Assessment - 11/22/15 0859    Subjective Pt presents taking a bigger step with her LTLE when she walks into the clinic.    Currently in Pain? No/denies   Multiple Pain Sites No                         [facility] Adult PT Treatment/Exercise - 11/22/15 0001      Vasopneumatic   Number Minutes Vasopneumatic  10 minutes   Vasopnuematic Location  Ankle   Vasopneumatic Pressure High   Vasopneumatic Temperature  3 flakes     Ankle Exercises: Aerobic   Stationary Bike L3 x 10 min     Ankle Exercises: Standing   SLS With RTLE toe taps on first stai 2 x10   First set with light UE on rails, second set with RTUE only   Other Standing Ankle Exercises Tandem stance bil 15 sec   Other Standing Ankle Exercises weight shifting on mini tramp 1 min 3 ways     Ankle Exercises: Stretches   Gastroc Stretch 3 reps;20 seconds   Slant Board Stretch 3 reps;20 seconds      Ankle Exercises: Seated   Ankle Circles/Pumps AROM;Left;20 reps   Other Seated Ankle Exercises Sit to stand 10x   VC to get more weight into the balls of her feet.      Ankle Exercises: Supine   T-Band 4 ways red for DF/PF yelllow EV/INV 2x10                  PT Short Term Goals - 11/18/15 1454      PT SHORT TERM GOAL #2   Title ability to walk with a cane using the CAM boot with step through step pattern due to increased weightbear on the left   Time 4   Period Weeks   Status On-going  Improving           PT Long Term Goals - 11/12/15 2102      PT LONG TERM GOAL #1   Title independent with HEP   Time 8   Period Weeks   Status On-going     PT LONG TERM GOAL #2   Title ability to ambulate without an assistive device for 1000 feet so she is able  to walk in the community with full weightbear on left   Time 8   Period Weeks   Status On-going     PT LONG TERM GOAL #3   Title left ankle AROM without normal limits so she is able to go up and down stairs with step over step pattern using  1 railing   Time 8   Period Weeks   Status On-going     PT LONG TERM GOAL #4   Title left ankle strength >/= 4/5 so she is able to return to pilates exercise class   Time 8   Period Weeks   Status On-going     PT LONG TERM GOAL #5   Title FOTO score is </= 48% limitation   Time 8   Period Weeks   Status On-going               Plan - 11/22/15 0900    Clinical Impression Statement Pt was able to exercise without her brace for the entire treatment today with no complaints of pain. Pt was also able to single leg balance with light UE support and no visable unsteadiness or pain.    Rehab Potential Excellent   Clinical Impairments Affecting Rehab Potential none   PT Frequency 3x / week   PT Duration 8 weeks   PT Treatment/Interventions Cryotherapy;Electrical Stimulation;Functional mobility training;Stair training;Gait training;Ultrasound;Moist Heat;Therapeutic  activities;Therapeutic exercise;Balance training;Neuromuscular re-education;Patient/family education;Passive range of motion;Manual techniques;Dry needling;Vasopneumatic Device   PT Next Visit Plan weight bearing exercises, sit to satnd, balance exercises.    Consulted and Agree with Plan of Care Patient      Patient will benefit from skilled therapeutic intervention in order to improve the following deficits and impairments:  Abnormal gait, Decreased range of motion, Difficulty walking, Increased fascial restricitons, Decreased endurance, Pain, Decreased activity tolerance, Decreased balance, Hypomobility, Impaired flexibility, Decreased strength, Decreased mobility, Increased edema  Visit Diagnosis: Other abnormalities of gait and mobility  Stiffness of left ankle, not elsewhere classified  Localized edema  Muscle weakness (generalized)     Problem List Patient Active Problem List   Diagnosis Date Noted  . Elevated BP 07/27/2014  . Bunion of left foot 07/26/2014  . Abnormality of gait 07/26/2014  . Cough 11/10/2013  . Post-nasal drainage 11/10/2013  . Abnormal urine odor 11/10/2013  . Visit for preventive health examination 07/28/2013  . Right anterior knee pain 07/22/2012  . Medicare annual wellness visit, subsequent 07/25/2011  . ANXIETY, SITUATIONAL 09/25/2009  . ARTHRITIS 09/25/2009  . VITAMIN D DEFICIENCY 09/27/2008  . HYPERLIPIDEMIA 09/17/2008  . Elevated blood pressure reading without diagnosis of hypertension 09/17/2008  . COLONIC POLYPS, HX OF 09/17/2008  . PERSONAL HISTORY DISEASES SKIN&SUBCUT TISSUE 09/17/2008    Pamela Alvarez 11/22/2015, 11:54 AM  [facility] [address], [address] [place], [place], [address] Phone: [phone]   Fax:  [phone]  Name: Pamela Alvarez MRN: [id number] Date of Birth: 01-04-1941

## 2015-11-25 ENCOUNTER — Encounter: Payer: Self-pay | Admitting: Physical Therapy

## 2015-11-25 ENCOUNTER — Ambulatory Visit: Payer: Medicare Other | Admitting: Physical Therapy

## 2015-11-25 DIAGNOSIS — R600 Localized edema: Secondary | ICD-10-CM

## 2015-11-25 DIAGNOSIS — M6281 Muscle weakness (generalized): Secondary | ICD-10-CM

## 2015-11-25 DIAGNOSIS — R2689 Other abnormalities of gait and mobility: Secondary | ICD-10-CM | POA: Diagnosis not present

## 2015-11-25 DIAGNOSIS — M25672 Stiffness of left ankle, not elsewhere classified: Secondary | ICD-10-CM

## 2015-11-25 NOTE — Therapy (Signed)
[facility] [address], [address] [place], [place], [address] Phone: [phone]   Fax:  [phone]  Physical Therapy Treatment  Patient Details  Name: Pamela ApleySandra Alvarez MRN: [id number] Date of Birth: 09/19/1940 Referring Provider: Dr. Teryl LucyJoshua Landau  Encounter Date: 11/25/2015      PT End of Session - 11/25/15 1245    Visit Number 14   Date for PT Re-Evaluation 12/17/15   Authorization Type medicare g-code at 15th visit   PT Start Time 1241   PT Stop Time 1325   PT Time Calculation (min) 44 min   Activity Tolerance Patient tolerated treatment well   Behavior During Therapy [facility] for tasks assessed/performed      Past Medical History:  Diagnosis Date  . Cyst 07-2008   Cyst on back x2 that were drained  . H/O echocardiogram 02-2000  . Hx of colonic polyps   . Hyperlipidemia     Past Surgical History:  Procedure Laterality Date  . POLYPECTOMY     Colon  . TONSILLECTOMY    . TUBAL LIGATION      There were no vitals filed for this visit.      Subjective Assessment - 11/25/15 1243    Subjective Continues to take small steps with cane. She can now walk around the house without her brace now. Only wears it for community walking.    Currently in Pain? Yes   Pain Score 6    Pain Location Knee   Pain Orientation Right;Left   Pain Descriptors / Indicators Aching   Aggravating Factors  night time   Pain Relieving Factors heat   Multiple Pain Sites No                         [facility] Adult PT Treatment/Exercise - 11/25/15 0001      Ambulation/Gait   Ambulation/Gait Yes   Ambulation/Gait Assistance 5: Supervision   Ambulation Distance (Feet) 160 Feet   Assistive device --  No device   Ambulation Surface Level     Vasopneumatic   Number Minutes Vasopneumatic  10 minutes   Vasopnuematic Location  Ankle   Vasopneumatic Pressure High   Vasopneumatic Temperature  3 flakes     Ankle Exercises: Aerobic    Stationary Bike L3 x 10 min     Ankle Exercises: Standing   BAPS --  Standing hip abd bil 10x    SLS With RTLE toe taps on first stai 3 x10   First set with light UE on rails, second set with RTUE only   Heel Raises 20 reps   Other Standing Ankle Exercises Tandem stance bil 15 sec   Other Standing Ankle Exercises weight shifting on mini tramp 1 min 3 ways     Ankle Exercises: Stretches   Gastroc Stretch 3 reps;20 seconds     Ankle Exercises: Seated   Heel Raises 20 reps   Other Seated Ankle Exercises Sit to stand 10x   VC to get more weight into the balls of her feet.                   PT Short Term Goals - 11/25/15 1246      PT SHORT TERM GOAL #2   Title ability to walk with a cane using the CAM boot with step through step pattern due to increased weightbear on the left   Time 4   Period Weeks   Status Achieved  Can  step through, just not 100% yet           PT Long Term Goals - 11/12/15 2102      PT LONG TERM GOAL #1   Title independent with HEP   Time 8   Period Weeks   Status On-going     PT LONG TERM GOAL #2   Title ability to ambulate without an assistive device for 1000 feet so she is able to walk in the community with full weightbear on left   Time 8   Period Weeks   Status On-going     PT LONG TERM GOAL #3   Title left ankle AROM without normal limits so she is able to go up and down stairs with step over step pattern using  1 railing   Time 8   Period Weeks   Status On-going     PT LONG TERM GOAL #4   Title left ankle strength >/= 4/5 so she is able to return to pilates exercise class   Time 8   Period Weeks   Status On-going     PT LONG TERM GOAL #5   Title FOTO score is </= 48% limitation   Time 8   Period Weeks   Status On-going               Plan - 11/25/15 1245    Clinical Impression Statement Working towards walking without the cane. Knees appear more weak than her ankle. Single leg stance required only very  light UE suppport.    Rehab Potential Excellent   Clinical Impairments Affecting Rehab Potential none   PT Frequency 3x / week   PT Duration 8 weeks   PT Treatment/Interventions Cryotherapy;Electrical Stimulation;Functional mobility training;Stair training;Gait training;Ultrasound;Moist Heat;Therapeutic activities;Therapeutic exercise;Balance training;Neuromuscular re-education;Patient/family education;Passive range of motion;Manual techniques;Dry needling;Vasopneumatic Device   PT Next Visit Plan weight bearing exercises, sit to satnd, balance exercises.    Consulted and Agree with Plan of Care --      Patient will benefit from skilled therapeutic intervention in order to improve the following deficits and impairments:  Abnormal gait, Decreased range of motion, Difficulty walking, Increased fascial restricitons, Decreased endurance, Pain, Decreased activity tolerance, Decreased balance, Hypomobility, Impaired flexibility, Decreased strength, Decreased mobility, Increased edema  Visit Diagnosis: Other abnormalities of gait and mobility  Stiffness of left ankle, not elsewhere classified  Localized edema  Muscle weakness (generalized)     Problem List Patient Active Problem List   Diagnosis Date Noted  . Elevated BP 07/27/2014  . Bunion of left foot 07/26/2014  . Abnormality of gait 07/26/2014  . Cough 11/10/2013  . Post-nasal drainage 11/10/2013  . Abnormal urine odor 11/10/2013  . Visit for preventive health examination 07/28/2013  . Right anterior knee pain 07/22/2012  . Medicare annual wellness visit, subsequent 07/25/2011  . ANXIETY, SITUATIONAL 09/25/2009  . ARTHRITIS 09/25/2009  . VITAMIN D DEFICIENCY 09/27/2008  . HYPERLIPIDEMIA 09/17/2008  . Elevated blood pressure reading without diagnosis of hypertension 09/17/2008  . COLONIC POLYPS, HX OF 09/17/2008  . PERSONAL HISTORY DISEASES SKIN&SUBCUT TISSUE 09/17/2008    Boykin Baetz, PTA 11/25/2015, 1:17 PM  [facility] [address] [place], [place], [address] Phone: [phone]   Fax:  [phone]  Name: Gabriel Paulding MRN: [id number] Date of Birth: Jun 10, 1940

## 2015-11-27 ENCOUNTER — Ambulatory Visit: Payer: Medicare Other | Admitting: Physical Therapy

## 2015-11-27 ENCOUNTER — Encounter: Payer: Self-pay | Admitting: Physical Therapy

## 2015-11-27 DIAGNOSIS — M6281 Muscle weakness (generalized): Secondary | ICD-10-CM

## 2015-11-27 DIAGNOSIS — R600 Localized edema: Secondary | ICD-10-CM

## 2015-11-27 DIAGNOSIS — R2689 Other abnormalities of gait and mobility: Secondary | ICD-10-CM

## 2015-11-27 DIAGNOSIS — M25672 Stiffness of left ankle, not elsewhere classified: Secondary | ICD-10-CM

## 2015-11-27 NOTE — Therapy (Signed)
[facility] [address] [place], [place], [address] Phone: [phone]   Fax:  [phone]  Physical Therapy Treatment  Patient Details  Name: Pamela Alvarez MRN: [id number] Date of Birth: May 04, 1940 Referring Provider: Dr. Teryl Lucy  Encounter Date: 11/27/2015      PT End of Session - 11/27/15 0934    Visit Number 15   Number of Visits 10   Date for PT Re-Evaluation 12/17/15   Authorization Type KX now   PT Start Time 0930   PT Stop Time 1023   PT Time Calculation (min) 53 min   Activity Tolerance Patient tolerated treatment well   Behavior During Therapy [facility] for tasks assessed/performed      Past Medical History:  Diagnosis Date  . Cyst 07-2008   Cyst on back x2 that were drained  . H/O echocardiogram 02-2000  . Hx of colonic polyps   . Hyperlipidemia     Past Surgical History:  Procedure Laterality Date  . POLYPECTOMY     Colon  . TONSILLECTOMY    . TUBAL LIGATION      There were no vitals filed for this visit.      Subjective Assessment - 11/27/15 0933    Subjective Knees continue to be achey and sore, interrupting her sleep. Trying Advil. Ankle continues to feel good.    Currently in Pain? No/denies   Multiple Pain Sites No            [facility] PT Assessment - 11/27/15 0001      AROM   Left Ankle Dorsiflexion 7                     [facility] Adult PT Treatment/Exercise - 11/27/15 0001      Ambulation/Gait   Ambulation/Gait Yes   Ambulation/Gait Assistance 5: Supervision  Just HHA with Lt hand   Ambulation Distance (Feet) 160 Feet   Assistive device --  working towards no device, pt fearful   Ambulation Surface Level     Knee/Hip Exercises: Seated   Long Arc Quad Strengthening;Both;2 sets;10 reps   Long Arc Quad Weight 2 lbs.   Ball Squeeze 25x     Cryotherapy   Number Minutes Cryotherapy 10 Minutes  concurrent with Game ready   Cryotherapy Location --  Bil  knees   Type of Cryotherapy Ice pack     Vasopneumatic   Number Minutes Vasopneumatic  10 minutes   Vasopnuematic Location  Ankle   Vasopneumatic Pressure High   Vasopneumatic Temperature  3 flakes     Ankle Exercises: Aerobic   Stationary Bike Nustep L3 x 10  min     Ankle Exercises: Standing   BAPS --  Standing hip abd bil 10x 2   SLS With RTLE toe taps on first stai 3 x10, then static 3x 15 with VC to contract quad   First set with light UE on rails, second set with RTUE only   Rocker Board --  Step ups on first stair 10x 2 bil   Heel Raises 20 reps   Other Standing Ankle Exercises Tandem stance bil 15 sec   Other Standing Ankle Exercises weight shifting on mini tramp 1 min 3 ways     Ankle Exercises: Stretches   Gastroc Stretch 3 reps;20 seconds     Ankle Exercises: Supine   T-Band 4 ways red for DF/PF yelllow EV/INV 2x10   Other Supine Ankle Exercises Bridge 10x  PT Short Term Goals - 11/25/15 1246      PT SHORT TERM GOAL #2   Title ability to walk with a cane using the CAM boot with step through step pattern due to increased weightbear on the left   Time 4   Period Weeks   Status Achieved  Can step through, just not 100% yet           PT Long Term Goals - 11/12/15 2102      PT LONG TERM GOAL #1   Title independent with HEP   Time 8   Period Weeks   Status On-going     PT LONG TERM GOAL #2   Title ability to ambulate without an assistive device for 1000 feet so she is able to walk in the community with full weightbear on left   Time 8   Period Weeks   Status On-going     PT LONG TERM GOAL #3   Title left ankle AROM without normal limits so she is able to go up and down stairs with step over step pattern using  1 railing   Time 8   Period Weeks   Status On-going     PT LONG TERM GOAL #4   Title left ankle strength >/= 4/5 so she is able to return to pilates exercise class   Time 8   Period Weeks   Status On-going      PT LONG TERM GOAL #5   Title FOTO score is </= 48% limitation   Time 8   Period Weeks   Status On-going               Plan - 11/27/15 0935    Clinical Impression Statement Fear of falling limits her ability to take a normal step and use the quad cane. When practicing her gait she is able to take a normal step as long as she holds someones hand. Little confidence when she has no support. Pt gained more AROM with her dorsiflexion . During theraband exercise pts inversion/eversion was of the best quality to date.    Rehab Potential Excellent   Clinical Impairments Affecting Rehab Potential none   PT Frequency 3x / week   PT Duration 8 weeks   PT Treatment/Interventions Cryotherapy;Electrical Stimulation;Functional mobility training;Stair training;Gait training;Ultrasound;Moist Heat;Therapeutic activities;Therapeutic exercise;Balance training;Neuromuscular re-education;Patient/family education;Passive range of motion;Manual techniques;Dry needling;Vasopneumatic Device   PT Next Visit Plan Gait, standing exercises to pronote weightbearing in Bil LE. Pt is weak throughout the LE chain.    Consulted and Agree with Plan of Care --      Patient will benefit from skilled therapeutic intervention in order to improve the following deficits and impairments:  Abnormal gait, Decreased range of motion, Difficulty walking, Increased fascial restricitons, Decreased endurance, Pain, Decreased activity tolerance, Decreased balance, Hypomobility, Impaired flexibility, Decreased strength, Decreased mobility, Increased edema  Visit Diagnosis: Other abnormalities of gait and mobility  Stiffness of left ankle, not elsewhere classified  Localized edema  Muscle weakness (generalized)     Problem List Patient Active Problem List   Diagnosis Date Noted  . Elevated BP 07/27/2014  . Bunion of left foot 07/26/2014  . Abnormality of gait 07/26/2014  . Cough 11/10/2013  . Post-nasal drainage  11/10/2013  . Abnormal urine odor 11/10/2013  . Visit for preventive health examination 07/28/2013  . Right anterior knee pain 07/22/2012  . Medicare annual wellness visit, subsequent 07/25/2011  . ANXIETY, SITUATIONAL 09/25/2009  . ARTHRITIS 09/25/2009  . VITAMIN  D DEFICIENCY 09/27/2008  . HYPERLIPIDEMIA 09/17/2008  . Elevated blood pressure reading without diagnosis of hypertension 09/17/2008  . COLONIC POLYPS, HX OF 09/17/2008  . PERSONAL HISTORY DISEASES SKIN&SUBCUT TISSUE 09/17/2008    COCHRAN,JENNIFER, PTA 11/27/2015, 10:19 AM  [facility] [address], [address] [place], [place], [address] Phone: [phone]   Fax:  [phone]  Name: Pamela Alvarez MRN: [id number] Date of Birth: 1941/03/01

## 2015-11-29 ENCOUNTER — Encounter: Payer: Self-pay | Admitting: Physical Therapy

## 2015-11-29 ENCOUNTER — Ambulatory Visit: Payer: Medicare Other | Admitting: Physical Therapy

## 2015-11-29 DIAGNOSIS — R600 Localized edema: Secondary | ICD-10-CM

## 2015-11-29 DIAGNOSIS — M6281 Muscle weakness (generalized): Secondary | ICD-10-CM

## 2015-11-29 DIAGNOSIS — R2689 Other abnormalities of gait and mobility: Secondary | ICD-10-CM | POA: Diagnosis not present

## 2015-11-29 DIAGNOSIS — M25672 Stiffness of left ankle, not elsewhere classified: Secondary | ICD-10-CM

## 2015-11-29 NOTE — Therapy (Signed)
[facility] [address] [place], [place], [address] Phone: [phone]   Fax:  [phone]  Physical Therapy Treatment  Patient Details  Name: Pamela Alvarez MRN: [id number] Date of Birth: 20-Mar-1940 Referring Provider: Dr. Teryl Lucy  Encounter Date: 11/29/2015      PT End of Session - 11/29/15 0850    Visit Number 16   Date for PT Re-Evaluation 12/17/15   Authorization Type KX now   PT Start Time 0845   PT Stop Time 0945   PT Time Calculation (min) 60 min   Activity Tolerance Patient tolerated treatment well   Behavior During Therapy [facility] for tasks assessed/performed      Past Medical History:  Diagnosis Date  . Cyst 07-2008   Cyst on back x2 that were drained  . H/O echocardiogram 02-2000  . Hx of colonic polyps   . Hyperlipidemia     Past Surgical History:  Procedure Laterality Date  . POLYPECTOMY     Colon  . TONSILLECTOMY    . TUBAL LIGATION      There were no vitals filed for this visit.      Subjective Assessment - 11/29/15 0851    Subjective Knee pain greater than foot/ankle. My knees feel weak.    Currently in Pain? Yes   Pain Score 4    Pain Location Foot   Pain Orientation Right  Right is correct, it is opposite of affected side.    Pain Descriptors / Indicators Dull;Aching   Multiple Pain Sites No                         [facility] Adult PT Treatment/Exercise - 11/29/15 0001      Knee/Hip Exercises: Stretches   Active Hamstring Stretch Both;2 reps;10 seconds     Knee/Hip Exercises: Seated   Long Arc Quad Strengthening;Both;2 sets;10 reps   Long Arc Quad Weight 2 lbs.   Ball Squeeze 25x     Cryotherapy   Number Minutes Cryotherapy 10 Minutes   Cryotherapy Location --  Bil knees   Type of Cryotherapy Ice pack     Vasopneumatic   Number Minutes Vasopneumatic  10 minutes   Vasopnuematic Location  Ankle   Vasopneumatic Pressure High   Vasopneumatic  Temperature  3 flakes     Ankle Exercises: Aerobic   Stationary Bike Nustep L3 x 10  min     Ankle Exercises: Standing   BAPS --  Standing hip abd bil 10x 2   SLS With RTLE toe taps on first stai 3 x10, then static 3x 15 with VC to contract quad   First set with light UE on rails, second set with RTUE only   Rocker Board --  Step ups on first stair 10x 2 bil   Heel Raises 20 reps   Other Standing Ankle Exercises Tandem stance bil 15 sec   Other Standing Ankle Exercises weight shifting on mini tramp 1 min 3 ways  Added marching on mini tramp 1 min     Ankle Exercises: Stretches   Gastroc Stretch 3 reps;20 seconds     Ankle Exercises: Supine   Other Supine Ankle Exercises Bridge 10x                   PT Short Term Goals - 11/25/15 1246      PT SHORT TERM GOAL #2   Title ability to walk with a cane using the CAM  boot with step through step pattern due to increased weightbear on the left   Time 4   Period Weeks   Status Achieved  Can step through, just not 100% yet           PT Long Term Goals - 11/12/15 2102      PT LONG TERM GOAL #1   Title independent with HEP   Time 8   Period Weeks   Status On-going     PT LONG TERM GOAL #2   Title ability to ambulate without an assistive device for 1000 feet so she is able to walk in the community with full weightbear on left   Time 8   Period Weeks   Status On-going     PT LONG TERM GOAL #3   Title left ankle AROM without normal limits so she is able to go up and down stairs with step over step pattern using  1 railing   Time 8   Period Weeks   Status On-going     PT LONG TERM GOAL #4   Title left ankle strength >/= 4/5 so she is able to return to pilates exercise class   Time 8   Period Weeks   Status On-going     PT LONG TERM GOAL #5   Title FOTO score is </= 48% limitation   Time 8   Period Weeks   Status On-going               Plan - 11/29/15 0915    Clinical Impression Statement Some  improvement noted as pt ambulated around the clinic in terms of bigger steps and more confidence in her balance. Knee pain continues to be limiting factor.       Patient will benefit from skilled therapeutic intervention in order to improve the following deficits and impairments:  Abnormal gait, Decreased range of motion, Difficulty walking, Increased fascial restricitons, Decreased endurance, Pain, Decreased activity tolerance, Decreased balance, Hypomobility, Impaired flexibility, Decreased strength, Decreased mobility, Increased edema  Visit Diagnosis: Other abnormalities of gait and mobility  Stiffness of left ankle, not elsewhere classified  Localized edema  Muscle weakness (generalized)     Problem List Patient Active Problem List   Diagnosis Date Noted  . Elevated BP 07/27/2014  . Bunion of left foot 07/26/2014  . Abnormality of gait 07/26/2014  . Cough 11/10/2013  . Post-nasal drainage 11/10/2013  . Abnormal urine odor 11/10/2013  . Visit for preventive health examination 07/28/2013  . Right anterior knee pain 07/22/2012  . Medicare annual wellness visit, subsequent 07/25/2011  . ANXIETY, SITUATIONAL 09/25/2009  . ARTHRITIS 09/25/2009  . VITAMIN D DEFICIENCY 09/27/2008  . HYPERLIPIDEMIA 09/17/2008  . Elevated blood pressure reading without diagnosis of hypertension 09/17/2008  . COLONIC POLYPS, HX OF 09/17/2008  . PERSONAL HISTORY DISEASES SKIN&SUBCUT TISSUE 09/17/2008    Pamela Alvarez 11/29/2015, 9:29 AM  [facility] [address], [address] [place], [place], [address] Phone: [phone]   Fax:  [phone]  Name: Pamela Alvarez MRN: [id number] Date of Birth: 03/12/1940

## 2015-12-02 ENCOUNTER — Encounter: Payer: Self-pay | Admitting: Physical Therapy

## 2015-12-02 ENCOUNTER — Ambulatory Visit: Payer: Medicare Other | Admitting: Physical Therapy

## 2015-12-02 DIAGNOSIS — R2689 Other abnormalities of gait and mobility: Secondary | ICD-10-CM | POA: Diagnosis not present

## 2015-12-02 DIAGNOSIS — R600 Localized edema: Secondary | ICD-10-CM

## 2015-12-02 DIAGNOSIS — M6281 Muscle weakness (generalized): Secondary | ICD-10-CM

## 2015-12-02 DIAGNOSIS — M25672 Stiffness of left ankle, not elsewhere classified: Secondary | ICD-10-CM

## 2015-12-02 NOTE — Therapy (Signed)
[facility] [address], [address] [place], [place], [address] Phone: [phone]   Fax:  [phone]  Physical Therapy Treatment  Patient Details  Name: Pamela ApleySandra Alvarez MRN: [id number] Date of Birth: 10/24/1940 Referring Provider: Dr. Teryl LucyJoshua Landau  Encounter Date: 12/02/2015      PT End of Session - 12/02/15 0942    Visit Number 17   Number of Visits 20   Date for PT Re-Evaluation 12/17/15   Authorization Type KX now   PT Start Time 0936   PT Stop Time 1025   PT Time Calculation (min) 49 min   Activity Tolerance Patient tolerated treatment well   Behavior During Therapy [facility] for tasks assessed/performed      Past Medical History:  Diagnosis Date  . Cyst 07-2008   Cyst on back x2 that were drained  . H/O echocardiogram 02-2000  . Hx of colonic polyps   . Hyperlipidemia     Past Surgical History:  Procedure Laterality Date  . POLYPECTOMY     Colon  . TONSILLECTOMY    . TUBAL LIGATION      There were no vitals filed for this visit.      Subjective Assessment - 12/02/15 0943    Subjective I chose not to take anything OTC for my knees. Pt is using an Arnica rub on her knees. Pt feels her confidence with her balance is improving.    Currently in Pain? No/denies   Multiple Pain Sites No                         [facility] Adult PT Treatment/Exercise - 12/02/15 0001      Knee/Hip Exercises: Standing   Other Standing Knee Exercises side stepping, tandem walking, marching along counter 4x each     Knee/Hip Exercises: Seated   Long Arc Quad Strengthening;Both;2 sets;10 reps   Long Arc Quad Weight 2 lbs.   Ball Squeeze 25x     Vasopneumatic   Number Minutes Vasopneumatic  10 minutes   Vasopnuematic Location  Ankle   Vasopneumatic Pressure High   Vasopneumatic Temperature  3 flakes     Ankle Exercises: Aerobic   Stationary Bike Nustep L3 x 10  min     Ankle Exercises: Standing   BAPS --   Standing hip abd bil 10x 2   SLS With RTLE toe taps on first stai 3 x10, then static 3x 15 with VC to contract quad   First set with light UE on rails, second set with RTUE only   Rocker Board --  Step ups on first stair 10x 2 bil   Heel Raises 20 reps   Other Standing Ankle Exercises Tandem stance bil 15 sec   Other Standing Ankle Exercises weight shifting on mini tramp 1 min 3 ways  Added marching on mini tramp 1 min     Ankle Exercises: Stretches   Gastroc Stretch 3 reps;20 seconds                  PT Short Term Goals - 11/25/15 1246      PT SHORT TERM GOAL #2   Title ability to walk with a cane using the CAM boot with step through step pattern due to increased weightbear on the left   Time 4   Period Weeks   Status Achieved  Can step through, just not 100% yet           PT Long  Term Goals - 11/12/15 2102      PT LONG TERM GOAL #1   Title independent with HEP   Time 8   Period Weeks   Status On-going     PT LONG TERM GOAL #2   Title ability to ambulate without an assistive device for 1000 feet so she is able to walk in the community with full weightbear on left   Time 8   Period Weeks   Status On-going     PT LONG TERM GOAL #3   Title left ankle AROM without normal limits so she is able to go up and down stairs with step over step pattern using  1 railing   Time 8   Period Weeks   Status On-going     PT LONG TERM GOAL #4   Title left ankle strength >/= 4/5 so she is able to return to pilates exercise class   Time 8   Period Weeks   Status On-going     PT LONG TERM GOAL #5   Title FOTO score is </= 48% limitation   Time 8   Period Weeks   Status On-going               Plan - 12/02/15 0943    Clinical Impression Statement Confidence has improved 75% per pt despite still making cautious movements. Gait looks great as long as she is holding hands.    Rehab Potential Excellent   Clinical Impairments Affecting Rehab Potential none    PT Frequency 3x / week   PT Duration 8 weeks   PT Treatment/Interventions Cryotherapy;Electrical Stimulation;Functional mobility training;Stair training;Gait training;Ultrasound;Moist Heat;Therapeutic activities;Therapeutic exercise;Balance training;Neuromuscular re-education;Patient/family education;Passive range of motion;Manual techniques;Dry needling;Vasopneumatic Device   PT Next Visit Plan Gait, standing exercises to pronote weightbearing in Bil LE. Pt is weak throughout the LE chain. Stepping over cones.   Consulted and Agree with Plan of Care Patient      Patient will benefit from skilled therapeutic intervention in order to improve the following deficits and impairments:  Abnormal gait, Decreased range of motion, Difficulty walking, Increased fascial restricitons, Decreased endurance, Pain, Decreased activity tolerance, Decreased balance, Hypomobility, Impaired flexibility, Decreased strength, Decreased mobility, Increased edema  Visit Diagnosis: Other abnormalities of gait and mobility  Stiffness of left ankle, not elsewhere classified  Localized edema  Muscle weakness (generalized)     Problem List Patient Active Problem List   Diagnosis Date Noted  . Elevated BP 07/27/2014  . Bunion of left foot 07/26/2014  . Abnormality of gait 07/26/2014  . Cough 11/10/2013  . Post-nasal drainage 11/10/2013  . Abnormal urine odor 11/10/2013  . Visit for preventive health examination 07/28/2013  . Right anterior knee pain 07/22/2012  . Medicare annual wellness visit, subsequent 07/25/2011  . ANXIETY, SITUATIONAL 09/25/2009  . ARTHRITIS 09/25/2009  . VITAMIN D DEFICIENCY 09/27/2008  . HYPERLIPIDEMIA 09/17/2008  . Elevated blood pressure reading without diagnosis of hypertension 09/17/2008  . COLONIC POLYPS, HX OF 09/17/2008  . PERSONAL HISTORY DISEASES SKIN&SUBCUT TISSUE 09/17/2008    Pamela Alvarez 12/02/2015, 10:21 AM  [facility] [address], [address] [place], [place], [address] Phone: [phone]   Fax:  [phone]  Name: Pamela Alvarez MRN: [id number] Date of Birth: 11-22-1940

## 2015-12-04 ENCOUNTER — Encounter: Payer: Self-pay | Admitting: Physical Therapy

## 2015-12-04 ENCOUNTER — Ambulatory Visit: Payer: Medicare Other | Admitting: Physical Therapy

## 2015-12-04 DIAGNOSIS — M25672 Stiffness of left ankle, not elsewhere classified: Secondary | ICD-10-CM

## 2015-12-04 DIAGNOSIS — M6281 Muscle weakness (generalized): Secondary | ICD-10-CM

## 2015-12-04 DIAGNOSIS — R2689 Other abnormalities of gait and mobility: Secondary | ICD-10-CM

## 2015-12-04 DIAGNOSIS — R600 Localized edema: Secondary | ICD-10-CM

## 2015-12-04 NOTE — Therapy (Signed)
[facility] [address], [address] [place], [place], [address] Phone: [phone]   Fax:  [phone]  Physical Therapy Treatment  Patient Details  Name: Pamela ApleySandra Alvarez MRN: [id number] Date of Birth: 09/23/1940 Referring Provider: Dr. Teryl LucyJoshua Landau  Encounter Date: 12/04/2015      PT End of Session - 12/04/15 1053    Visit Number 18   Number of Visits 20   Date for PT Re-Evaluation 12/17/15   Authorization Type KX now   PT Start Time 1033   PT Stop Time 1125   PT Time Calculation (min) 52 min   Activity Tolerance Patient tolerated treatment well   Behavior During Therapy [facility] for tasks assessed/performed      Past Medical History:  Diagnosis Date  . Cyst 07-2008   Cyst on back x2 that were drained  . H/O echocardiogram 02-2000  . Hx of colonic polyps   . Hyperlipidemia     Past Surgical History:  Procedure Laterality Date  . POLYPECTOMY     Colon  . TONSILLECTOMY    . TUBAL LIGATION      There were no vitals filed for this visit.      Subjective Assessment - 12/04/15 1054    Subjective Took Tylenol before bed and this helped me sleep without knee pain.    Currently in Pain? No/denies                         [facility] Adult PT Treatment/Exercise - 12/04/15 0001      Knee/Hip Exercises: Standing   Other Standing Knee Exercises side stepping, tandem walking, marching along counter 4x each     Knee/Hip Exercises: Seated   Long Arc Quad Strengthening;Both;2 sets;10 reps   Long Arc Quad Weight 2 lbs.   Ball Squeeze 25x     Vasopneumatic   Number Minutes Vasopneumatic  10 minutes   Vasopnuematic Location  Ankle   Vasopneumatic Pressure High   Vasopneumatic Temperature  3 flakes     Ankle Exercises: Aerobic   Stationary Bike Nustep L3 x 10  min     Ankle Exercises: Standing   SLS With RTLE toe taps on first stai 3 x10, then static 3x 15 with VC to contract quad   First set with light UE on  rails, second set with RTUE only   Rocker Board --  Step ups on first stair 10x 2 bil   Heel Raises --  Up & down stairs 4x: Pt had difficulty following directions   Other Standing Ankle Exercises Tandem stance bil 15 sec   Other Standing Ankle Exercises weight shifting on mini tramp 1 min 3 ways  Added marching on mini tramp 1 min                  PT Short Term Goals - 11/25/15 1246      PT SHORT TERM GOAL #2   Title ability to walk with a cane using the CAM boot with step through step pattern due to increased weightbear on the left   Time 4   Period Weeks   Status Achieved  Can step through, just not 100% yet           PT Long Term Goals - 11/12/15 2102      PT LONG TERM GOAL #1   Title independent with HEP   Time 8   Period Weeks   Status On-going  PT LONG TERM GOAL #2   Title ability to ambulate without an assistive device for 1000 feet so she is able to walk in the community with full weightbear on left   Time 8   Period Weeks   Status On-going     PT LONG TERM GOAL #3   Title left ankle AROM without normal limits so she is able to go up and down stairs with step over step pattern using  1 railing   Time 8   Period Weeks   Status On-going     PT LONG TERM GOAL #4   Title left ankle strength >/= 4/5 so she is able to return to pilates exercise class   Time 8   Period Weeks   Status On-going     PT LONG TERM GOAL #5   Title FOTO score is </= 48% limitation   Time 8   Period Weeks   Status On-going               Plan - 12/04/15 1053    Clinical Impression Statement Pt reports Tylenol has helped her sleep at night this week. Pt has difficulty following directions on the stairs: verbal cues were to practice reciprocally but kept performing step to step. pt reports she is working on her walking with her husband at home now. Continues to prefer HHA.    Rehab Potential Excellent   Clinical Impairments Affecting Rehab Potential none   PT  Frequency 3x / week   PT Duration 8 weeks   PT Treatment/Interventions Cryotherapy;Electrical Stimulation;Functional mobility training;Stair training;Gait training;Ultrasound;Moist Heat;Therapeutic activities;Therapeutic exercise;Balance training;Neuromuscular re-education;Patient/family education;Passive range of motion;Manual techniques;Dry needling;Vasopneumatic Device   PT Next Visit Plan Gait, standing exercises to pronote weightbearing in Bil LE. Pt is weak throughout the LE chain. Stepping over cones.   Consulted and Agree with Plan of Care Patient      Patient will benefit from skilled therapeutic intervention in order to improve the following deficits and impairments:  Abnormal gait, Decreased range of motion, Difficulty walking, Increased fascial restricitons, Decreased endurance, Pain, Decreased activity tolerance, Decreased balance, Hypomobility, Impaired flexibility, Decreased strength, Decreased mobility, Increased edema  Visit Diagnosis: Other abnormalities of gait and mobility  Stiffness of left ankle, not elsewhere classified  Localized edema  Muscle weakness (generalized)     Problem List Patient Active Problem List   Diagnosis Date Noted  . Elevated BP 07/27/2014  . Bunion of left foot 07/26/2014  . Abnormality of gait 07/26/2014  . Cough 11/10/2013  . Post-nasal drainage 11/10/2013  . Abnormal urine odor 11/10/2013  . Visit for preventive health examination 07/28/2013  . Right anterior knee pain 07/22/2012  . Medicare annual wellness visit, subsequent 07/25/2011  . ANXIETY, SITUATIONAL 09/25/2009  . ARTHRITIS 09/25/2009  . VITAMIN D DEFICIENCY 09/27/2008  . HYPERLIPIDEMIA 09/17/2008  . Elevated blood pressure reading without diagnosis of hypertension 09/17/2008  . COLONIC POLYPS, HX OF 09/17/2008  . PERSONAL HISTORY DISEASES SKIN&SUBCUT TISSUE 09/17/2008    Wilmore Holsomback, PTA 12/04/2015, 11:29 AM  [facility] [address] [place], [place], [address] Phone: [phone]   Fax:  [phone]  Name: Pamela Alvarez MRN: [id number] Date of Birth: 21-Apr-1940

## 2015-12-06 ENCOUNTER — Ambulatory Visit: Payer: Medicare Other | Admitting: Physical Therapy

## 2015-12-06 ENCOUNTER — Encounter: Payer: Self-pay | Admitting: Physical Therapy

## 2015-12-06 ENCOUNTER — Other Ambulatory Visit (INDEPENDENT_AMBULATORY_CARE_PROVIDER_SITE_OTHER): Payer: Medicare Other

## 2015-12-06 DIAGNOSIS — M25672 Stiffness of left ankle, not elsewhere classified: Secondary | ICD-10-CM

## 2015-12-06 DIAGNOSIS — Z0000 Encounter for general adult medical examination without abnormal findings: Secondary | ICD-10-CM | POA: Diagnosis not present

## 2015-12-06 DIAGNOSIS — R2689 Other abnormalities of gait and mobility: Secondary | ICD-10-CM

## 2015-12-06 DIAGNOSIS — R600 Localized edema: Secondary | ICD-10-CM

## 2015-12-06 DIAGNOSIS — M6281 Muscle weakness (generalized): Secondary | ICD-10-CM

## 2015-12-06 LAB — BASIC METABOLIC PANEL
BUN: 12 mg/dL (ref 6–23)
CO2: 28 meq/L (ref 19–32)
CREATININE: 0.91 mg/dL (ref 0.40–1.20)
Calcium: 9.2 mg/dL (ref 8.4–10.5)
Chloride: 106 mEq/L (ref 96–112)
GFR: 63.89 mL/min (ref >60.00)
GLUCOSE: 89 mg/dL (ref 70–99)
Potassium: 4 mEq/L (ref 3.5–5.1)
SODIUM: 142 meq/L (ref 135–145)

## 2015-12-06 LAB — CBC WITH DIFFERENTIAL/PLATELET
BASOS ABS: 0 10*3/uL (ref 0.0–0.1)
Basophils Relative: 0.6 % (ref 0.0–3.0)
EOS ABS: 0.3 10*3/uL (ref 0.0–0.7)
Eosinophils Relative: 3.7 % (ref 0.0–5.0)
HEMATOCRIT: 41.3 % (ref 36.0–46.0)
HEMOGLOBIN: 13.8 g/dL (ref 12.0–15.0)
LYMPHS PCT: 36.6 % (ref 12.0–46.0)
Lymphs Abs: 2.5 10*3/uL (ref 0.7–4.0)
MCHC: 33.3 g/dL (ref 30.0–36.0)
MCV: 87.3 fl (ref 78.0–100.0)
MONOS PCT: 8.1 % (ref 3.0–12.0)
Monocytes Absolute: 0.6 10*3/uL (ref 0.1–1.0)
Neutro Abs: 3.5 10*3/uL (ref 1.4–7.7)
Neutrophils Relative %: 51 % (ref 43.0–77.0)
Platelets: 302 10*3/uL (ref 150.0–400.0)
RBC: 4.74 Mil/uL (ref 3.87–5.11)
RDW: 16.4 % — ABNORMAL HIGH (ref 11.5–15.5)
WBC: 7 10*3/uL (ref 4.0–10.5)

## 2015-12-06 LAB — TSH: TSH: 2.89 u[IU]/mL (ref 0.35–4.50)

## 2015-12-06 LAB — HEPATIC FUNCTION PANEL
ALT: 22 U/L (ref 0–35)
AST: 24 U/L (ref 0–37)
Albumin: 3.6 g/dL (ref 3.5–5.2)
Alkaline Phosphatase: 78 U/L (ref 39–117)
BILIRUBIN DIRECT: 0.1 mg/dL (ref 0.0–0.3)
BILIRUBIN TOTAL: 0.6 mg/dL (ref 0.2–1.2)
Total Protein: 6.8 g/dL (ref 6.0–8.3)

## 2015-12-06 LAB — LIPID PANEL
CHOL/HDL RATIO: 4
Cholesterol: 295 mg/dL — ABNORMAL HIGH (ref 0–200)
HDL: 72.3 mg/dL (ref >39.00)
LDL CALC: 201 mg/dL — AB (ref 0–99)
NonHDL: 223.08
TRIGLYCERIDES: 108 mg/dL (ref 0.0–149.0)
VLDL: 21.6 mg/dL (ref 0.0–40.0)

## 2015-12-06 NOTE — Therapy (Signed)
[facility] [address], [address] [place], [place], [address] Phone: [phone]   Fax:  [phone]  Physical Therapy Treatment  Patient Details  Name: Pamela ApleySandra Alvarez MRN: [id number] Date of Birth: 12/13/1940 Referring Provider: Dr. Teryl LucyJoshua Landau  Encounter Date: 12/06/2015      PT End of Session - 12/06/15 0850    Visit Number 19   Number of Visits 20   Date for PT Re-Evaluation 12/17/15   Authorization Type KX now   PT Start Time 0847  Pt started getting dizzy due to fasting the last 12 hrs.   PT Stop Time 0923   PT Time Calculation (min) 36 min   Activity Tolerance Patient tolerated treatment well;Patient limited by fatigue   Behavior During Therapy [facility] for tasks assessed/performed      Past Medical History:  Diagnosis Date  . Cyst 07-2008   Cyst on back x2 that were drained  . H/O echocardiogram 02-2000  . Hx of colonic polyps   . Hyperlipidemia     Past Surgical History:  Procedure Laterality Date  . POLYPECTOMY     Colon  . TONSILLECTOMY    . TUBAL LIGATION      There were no vitals filed for this visit.      Subjective Assessment - 12/06/15 0849    Subjective Had blood drawn for yearly physical this AM, feeling a little tired from fasting.    Multiple Pain Sites No                         [facility] Adult PT Treatment/Exercise - 12/06/15 0001      Ambulation/Gait   Ambulation/Gait Yes   Assistive device --  HHA to no device no HHA   Gait Pattern Step-through pattern;Decreased arm swing - left;Decreased stride length   Ambulation Surface Level   Gait Comments Work on speed, increasing step length, and stepping over low objects  HHA      Knee/Hip Exercises: Standing   Other Standing Knee Exercises side stepping, tandem walking, marching along counter 4x each     Ankle Exercises: Aerobic   Stationary Bike Nustep L3 x 10  min     Ankle Exercises: Standing   Other Standing Ankle  Exercises weight shifting on mini tramp 1 min 3 ways  Added marching on mini tramp 1 min                  PT Short Term Goals - 11/25/15 1246      PT SHORT TERM GOAL #2   Title ability to walk with a cane using the CAM boot with step through step pattern due to increased weightbear on the left   Time 4   Period Weeks   Status Achieved  Can step through, just not 100% yet           PT Long Term Goals - 11/12/15 2102      PT LONG TERM GOAL #1   Title independent with HEP   Time 8   Period Weeks   Status On-going     PT LONG TERM GOAL #2   Title ability to ambulate without an assistive device for 1000 feet so she is able to walk in the community with full weightbear on left   Time 8   Period Weeks   Status On-going     PT LONG TERM GOAL #3   Title left ankle AROM without normal limits  so she is able to go up and down stairs with step over step pattern using  1 railing   Time 8   Period Weeks   Status On-going     PT LONG TERM GOAL #4   Title left ankle strength >/= 4/5 so she is able to return to pilates exercise class   Time 8   Period Weeks   Status On-going     PT LONG TERM GOAL #5   Title FOTO score is </= 48% limitation   Time 8   Period Weeks   Status On-going               Plan - 12/06/15 0851    Clinical Impression Statement worked alot on Investment banker, operational today eliminating the HHA to increase pt's confidence. No sign of instability, RT knee was sore per report from her OA, LT ankle was solid, still fearful on someone not touching her when she walks.    Rehab Potential Excellent   Clinical Impairments Affecting Rehab Potential none   PT Frequency 3x / week   PT Duration 8 weeks   PT Treatment/Interventions Cryotherapy;Electrical Stimulation;Functional mobility training;Stair training;Gait training;Ultrasound;Moist Heat;Therapeutic activities;Therapeutic exercise;Balance training;Neuromuscular re-education;Patient/family education;Passive  range of motion;Manual techniques;Dry needling;Vasopneumatic Device   PT Next Visit Plan G code next/FOTO, continue with gait training without device   Consulted and Agree with Plan of Care Patient      Patient will benefit from skilled therapeutic intervention in order to improve the following deficits and impairments:  Abnormal gait, Decreased range of motion, Difficulty walking, Increased fascial restricitons, Decreased endurance, Pain, Decreased activity tolerance, Decreased balance, Hypomobility, Impaired flexibility, Decreased strength, Decreased mobility, Increased edema  Visit Diagnosis: Other abnormalities of gait and mobility  Stiffness of left ankle, not elsewhere classified  Localized edema  Muscle weakness (generalized)     Problem List Patient Active Problem List   Diagnosis Date Noted  . Elevated BP 07/27/2014  . Bunion of left foot 07/26/2014  . Abnormality of gait 07/26/2014  . Cough 11/10/2013  . Post-nasal drainage 11/10/2013  . Abnormal urine odor 11/10/2013  . Visit for preventive health examination 07/28/2013  . Right anterior knee pain 07/22/2012  . Medicare annual wellness visit, subsequent 07/25/2011  . ANXIETY, SITUATIONAL 09/25/2009  . ARTHRITIS 09/25/2009  . VITAMIN D DEFICIENCY 09/27/2008  . HYPERLIPIDEMIA 09/17/2008  . Elevated blood pressure reading without diagnosis of hypertension 09/17/2008  . COLONIC POLYPS, HX OF 09/17/2008  . PERSONAL HISTORY DISEASES SKIN&SUBCUT TISSUE 09/17/2008    Pamela Alvarez, PTA 12/06/2015, 9:22 AM  [facility] [address] [place], [place], [address] Phone: [phone]   Fax:  [phone]  Name: Pamela Alvarez MRN: [id number] Date of Birth: 06-06-1940

## 2015-12-06 NOTE — Progress Notes (Signed)
Chief Complaint  Patient presents with  . Annual Exam    medicare    HPI:  Pamela Alvarez 75 y.o. comes in today for Preventive Medicare wellness visit .Since last visit.She is recovering from her left lower extremity fracture. She is now in a support splint brace  as opposed to a boot. Walking with a 4 prong cane.  She is not taking vitamin D and stopped taking atorvastatin over the summer when she got the fracture. She never began the losartan just didn't get to it. Uncertain of blood pressure is at this time.  Has had some constipation since her injury and inactivity asked what can be used to sites dietary changes. No fever blood obstructive symptoms.  Health Maintenance  Topic Date Due  . DEXA SCAN  12/15/2004  . INFLUENZA VACCINE  10/08/2015  . COLONOSCOPY  02/11/2016  . TETANUS/TDAP  07/08/2018  . ZOSTAVAX  Completed  . PNA vac Low Risk Adult  Completed   Health Maintenance Review LIFESTYLE:  TAD less one per day wine  Otherwise neg Sugar beverages:rare Sleep: 8 hours    MEDICARE DOCUMENT QUESTIONS  TO SCAN   Hearing: ok pass  Vision:  No limitations at present . Last eye check UTD g;asses   Safety:  Has smoke detector and wears seat belts.  No firearms. No excess sun exposure. Sees dentist regularly.  Falls: yes see  Last notes   Advance directive :  Reviewed  Has one.  Memory: Felt to be good  , no concern from her or her family.  Depression: No anhedonia unusual crying or depressive symptoms  Nutrition: Eats well balanced diet;? adequate calcium and ?vitamin D. No swallowing chewing problems.  Injury: no major injuries in the last six months.  Other healthcare providers:  Reviewed today .  Social:  Lives with spouse married. No pets.   Preventive parameters: up-to-date  Reviewed   ADLS:   There previously problems or need for assistance  driving, feeding, obtaining food, dressing, toileting and bathing, managing money using phone. She is independent.  But now needs help recovering from ankle fx using 4 pronged cane     ROS:  GEN/ HEENT: No fever, significant weight changes sweats headaches vision problems hearing changes, CV/ PULM; No chest pain shortness of breath cough, syncope,edema  change in exercise tolerance. GI /GU: No adominal pain, vomiting, change in bowel habits. No blood in the stool. No significant GU symptoms. SKIN/HEME: ,no acute skin rashes suspicious lesions or bleeding. No lymphadenopathy, nodules, masses.  NEURO/ PSYCH:  No neurologic signs such as weakness numbness. No depression anxiety. IMM/ Allergy: No unusual infections.  Allergy .   REST of 12 system review negative except as per HPI   Past Medical History:  Diagnosis Date  . Cyst 07-2008   Cyst on back x2 that were drained  . H/O echocardiogram 02-2000  . Hx of colonic polyps   . Hyperlipidemia     Family History  Problem Relation Age of Onset  . Hypertension Mother   . Parkinsonism Mother   . Angina Father   . Heart disease Father   . Colon cancer Father   . Lung cancer Father   . Skin cancer Father   . Cancer Father     Heart  . Breast cancer Sister     over 52  . Diabetes Sister   . Heart disease Brother 45    triple bypass surgery  . Macular degeneration Sister   . Diabetes  Sister     prediabetic  . Hearing loss Sister     hearing problems  . Vasculitis Sister     Social History   Social History  . Marital status: Married    Spouse name: N/A  . Number of children: N/A  . Years of education: N/A   Occupational History  . retired     Teaching laboratory technician [name]   Social History Main Topics  . Smoking status: Never Smoker  . Smokeless tobacco: Never Used  . Alcohol use 4.2 oz/week    7 Glasses of wine per week     Comment: one a day  . Drug use:   . Sexual activity: Not Asked   Other Topics Concern  . None   Social History Narrative   Regular exercise-yes   Moved back to [place] from [place] of 2 cat   G3 P2     Exercises walking regu;arly    Retired  Teaching laboratory technician          Outpatient Encounter Prescriptions as of 12/09/2015  Medication Sig  . atorvastatin (LIPITOR) 10 MG tablet Take 1 tablet (10 mg total) by mouth daily.  Marland Kitchen losartan (COZAAR) 25 MG tablet TAKE 1 TABLET (25 MG TOTAL) BY MOUTH DAILY.  . [DISCONTINUED] atorvastatin (LIPITOR) 10 MG tablet Take 1 tablet (10 mg total) by mouth daily.  . [DISCONTINUED] amoxicillin (AMOXIL) 500 MG capsule Take 1 capsule (500 mg total) by mouth 3 (three) times daily. sinusitis (Patient not taking: Reported on 10/22/2015)   No facility-administered encounter medications on file as of 12/09/2015.     EXAM:  BP (!) 150/88 (BP Location: Right Arm, Patient Position: Sitting, Cuff Size: Normal)   Pulse 93   Temp 97.7 F (36.5 C) (Oral)   Resp 16   Ht '5\' 2"'  (1.575 m)   Wt 144 lb 12.8 oz (65.7 kg)   SpO2 98%   BMI 26.48 kg/m   Body mass index is 26.48 kg/m. Hearing screen pass Physical Exam: Vital signs reviewed [id number] is a well-developed well-nourished alert cooperative   who appears stated age in no acute distress.  Walking midl limp with cane  Can get up on table independently HEENT: normocephalic atraumatic , Eyes: PERRL EOM's full, conjunctiva clear, Nares: paten,t no deformity discharge or tenderness., Ears: no deformity EAC's clear TMs with normal landmarks. Mouth: clear OP, no lesions, edema.  Moist mucous membranes. Dentition in adequate repair. NECK: supple without masses, thyromegaly or bruits. CHEST/PULM:  Clear to auscultation and percussion breath sounds equal no wheeze , rales or rhonchi. No chest wall deformities mild kyphosis or tenderness.Breast: normal by inspection . No dimpling, discharge, masses, tenderness or discharge . CV: PMI is nondisplaced, S1 S2 no gallops, murmurs, rubs. Peripheral pulses are full without delay.No JVD .  ABDOMEN: Bowel sounds normal nontender  No guard or rebound, no hepato splenomegal no CVA  tenderness.   Extremtities:  No clubbing cyanosis or edema,  Foot in brace not examined to see ortho tomorrowNEURO:  Oriented x3, cranial nerves 3-12 appear to be intact, no obvious focal weakness,gait within normal limits no abnormal reflexes or asymmetrical SKIN: No acute rashes normal turgor, color, no bruising or petechiae. PSYCH: Oriented, good eye contact, no obvious depression anxiety, cognition and judgment appear normal. LN: no cervical axillary inguinal adenopathy No noted deficits in memory, attention, and speech.   Lab Results  Component Value Date   WBC 7.0 12/06/2015   HGB 13.8 12/06/2015   HCT  41.3 12/06/2015   PLT 302.0 12/06/2015   GLUCOSE 89 12/06/2015   CHOL 295 (H) 12/06/2015   TRIG 108.0 12/06/2015   HDL 72.30 12/06/2015   LDLDIRECT 191.8 07/22/2012   LDLCALC 201 (H) 12/06/2015   ALT 22 12/06/2015   AST 24 12/06/2015   NA 142 12/06/2015   K 4.0 12/06/2015   CL 106 12/06/2015   CREATININE 0.91 12/06/2015   BUN 12 12/06/2015   CO2 28 12/06/2015   TSH 2.89 12/06/2015    ASSESSMENT AND PLAN:  Discussed the following assessment and plan:  Visit for preventive health examination  Medicare annual wellness visit, subsequent  Hyperlipidemia  White coat syndrome without hypertension  Hx of fracture - dis fall trip prevention  also  use cane on uneven unfarmiliar ground at this time - Plan: DG Bone Density  Estrogen deficiency - Plan: DG Bone Density  Other constipation - prob result from inactivity  from fracture diet change in meds  disc see text  Vitamin D deficiency Patient aware of fall prevention reviewed hypertension versus elevated blood pressure reading to restart the Lipitor and some over-the-counter vitamin D try to get a bone density before next visit. Flu vaccine today after discussion Patient Care Team: Burnis Medin, MD as PCP - General Ronald Lobo, MD (Gastroenterology) Sharyne Peach, MD (Ophthalmology) Rolm Bookbinder, MD as Attending  Physician (Dermatology) Stefanie Libel, MD as Consulting Physician (Sports Medicine) Marchia Bond, MD as Consulting Physician (Orthopedic Surgery)  Patient Instructions  Glad you are doing better. Begin taking 800 with thousand units of vitamin D a day for bone health protection. Get an appointment for bone density or DEXA scan. This will be at the [facility] office building I will put order in that is good for a year and you should call to get an appointment time when you are ready. Your blood work this month was good except your cholesterol was very high. I advise going back on the Lipitor and we can follow this up to make sure the level is coming down. Blood pressure is controlled control is very important to prevent stroke and heart attack. Take blood pressure readings twice a day for 7- 10 days and then periodically .To ensure below 140/90   . If your blood pressure is not at goal please begin the losartan 25 mg a day as we discussed.  Advise  ROV in 3-4 months with lipid and bmp vit D  pre visit  To recheck cholesterol and BP situation .  Miralax 1-2 capful per day  And titrate   Health Maintenance, Female Adopting a healthy lifestyle and getting preventive care can go a long way to promote health and wellness. Talk with your health care provider about what schedule of regular examinations is right for you. This is a good chance for you to check in with your provider about disease prevention and staying healthy. In between checkups, there are plenty of things you can do on your own. Experts have done a lot of research about which lifestyle changes and preventive measures are most likely to keep you healthy. Ask your health care provider for more information. WEIGHT AND DIET  Eat a healthy diet  Be sure to include plenty of vegetables, fruits, low-fat dairy products, and lean protein.  Do not eat a lot of foods high in solid fats, added sugars, or salt.  Get regular exercise. This is one of  the most important things you can do for your health.  Most adults  should exercise for at least 150 minutes each week. The exercise should increase your heart rate and make you sweat (moderate-intensity exercise).  Most adults should also do strengthening exercises at least twice a week. This is in addition to the moderate-intensity exercise.  Maintain a healthy weight  Body mass index (BMI) is a measurement that can be used to identify possible weight problems. It estimates body fat based on height and weight. Your health care provider can help determine your BMI and help you achieve or maintain a healthy weight.  For females 52 years of age and older:   A BMI below 18.5 is considered underweight.  A BMI of 18.5 to 24.9 is normal.  A BMI of 25 to 29.9 is considered overweight.  A BMI of 30 and above is considered obese.  Watch levels of cholesterol and blood lipids  You should start having your blood tested for lipids and cholesterol at 76 years of age, then have this test every 5 years.  You may need to have your cholesterol levels checked more often if:  Your lipid or cholesterol levels are high.  You are older than 76 years of age.  You are at high risk for heart disease.  CANCER SCREENING   Lung Cancer  Lung cancer screening is recommended for adults 52-1 years old who are at high risk for lung cancer because of a history of smoking.  A yearly low-dose CT scan of the lungs is recommended for people who:  Currently smoke.  Have quit within the past 15 years.  Have at least a 30-pack-year history of smoking. A pack year is smoking an average of one pack of cigarettes a day for 1 year.  Yearly screening should continue until it has been 15 years since you quit.  Yearly screening should stop if you develop a health problem that would prevent you from having lung cancer treatment.  Breast Cancer  Practice breast self-awareness. This means understanding how your  breasts normally appear and feel.  It also means doing regular breast self-exams. Let your health care provider know about any changes, no matter how small.  If you are in your 20s or 30s, you should have a clinical breast exam (CBE) by a health care provider every 1-3 years as part of a regular health exam.  If you are 87 or older, have a CBE every year. Also consider having a breast X-ray (mammogram) every year.  If you have a family history of breast cancer, talk to your health care provider about genetic screening.  If you are at high risk for breast cancer, talk to your health care provider about having an MRI and a mammogram every year.  Breast cancer gene (BRCA) assessment is recommended for women who have family members with BRCA-related cancers. BRCA-related cancers include:  Breast.  Ovarian.  Tubal.  Peritoneal cancers.  Results of the assessment will determine the need for genetic counseling and BRCA1 and BRCA2 testing. Cervical Cancer Your health care provider may recommend that you be screened regularly for cancer of the pelvic organs (ovaries, uterus, and vagina). This screening involves a pelvic examination, including checking for microscopic changes to the surface of your cervix (Pap test). You may be encouraged to have this screening done every 3 years, beginning at age 49.  For women ages 76-65, health care providers may recommend pelvic exams and Pap testing every 3 years, or they may recommend the Pap and pelvic exam, combined with testing for human papilloma  virus (HPV), every 5 years. Some types of HPV increase your risk of cervical cancer. Testing for HPV may also be done on women of any age with unclear Pap test results.  Other health care providers may not recommend any screening for nonpregnant women who are considered low risk for pelvic cancer and who do not have symptoms. Ask your health care provider if a screening pelvic exam is right for you.  If you  have had past treatment for cervical cancer or a condition that could lead to cancer, you need Pap tests and screening for cancer for at least 20 years after your treatment. If Pap tests have been discontinued, your risk factors (such as having a new sexual partner) need to be reassessed to determine if screening should resume. Some women have medical problems that increase the chance of getting cervical cancer. In these cases, your health care provider may recommend more frequent screening and Pap tests. Colorectal Cancer  This type of cancer can be detected and often prevented.  Routine colorectal cancer screening usually begins at 76 years of age and continues through 76 years of age.  Your health care provider may recommend screening at an earlier age if you have risk factors for colon cancer.  Your health care provider may also recommend using home test kits to check for hidden blood in the stool.  A small camera at the end of a tube can be used to examine your colon directly (sigmoidoscopy or colonoscopy). This is done to check for the earliest forms of colorectal cancer.  Routine screening usually begins at age 98.  Direct examination of the colon should be repeated every 5-10 years through 76 years of age. However, you may need to be screened more often if early forms of precancerous polyps or small growths are found. Skin Cancer  Check your skin from head to toe regularly.  Tell your health care provider about any new moles or changes in moles, especially if there is a change in a mole's shape or color.  Also tell your health care provider if you have a mole that is larger than the size of a pencil eraser.  Always use sunscreen. Apply sunscreen liberally and repeatedly throughout the day.  Protect yourself by wearing long sleeves, pants, a wide-brimmed hat, and sunglasses whenever you are outside. HEART DISEASE, DIABETES, AND HIGH BLOOD PRESSURE   High blood pressure causes  heart disease and increases the risk of stroke. High blood pressure is more likely to develop in:  People who have blood pressure in the high end of the normal range (130-139/85-89 mm Hg).  People who are overweight or obese.  People who are African American.  If you are 85-45 years of age, have your blood pressure checked every 3-5 years. If you are 45 years of age or older, have your blood pressure checked every year. You should have your blood pressure measured twice--once when you are at a hospital or clinic, and once when you are not at a hospital or clinic. Record the average of the two measurements. To check your blood pressure when you are not at a hospital or clinic, you can use:  An automated blood pressure machine at a pharmacy.  A home blood pressure monitor.  If you are between 33 years and 63 years old, ask your health care provider if you should take aspirin to prevent strokes.  Have regular diabetes screenings. This involves taking a blood sample to check your fasting blood sugar  level.  If you are at a normal weight and have a low risk for diabetes, have this test once every three years after 76 years of age.  If you are overweight and have a high risk for diabetes, consider being tested at a younger age or more often. PREVENTING INFECTION  Hepatitis B  If you have a higher risk for hepatitis B, you should be screened for this virus. You are considered at high risk for hepatitis B if:  You were born in a country where hepatitis B is common. Ask your health care provider which countries are considered high risk.  Your parents were born in a high-risk country, and you have not been immunized against hepatitis B (hepatitis B vaccine).  You have HIV or AIDS.  You use needles to inject street drugs.  You live with someone who has hepatitis B.  You have had sex with someone who has hepatitis B.  You get hemodialysis treatment.  You take certain medicines for  conditions, including cancer, organ transplantation, and autoimmune conditions. Hepatitis C  Blood testing is recommended for:  Everyone born from [birth year] through 1966.  Anyone with known risk factors for hepatitis C. Sexually transmitted infections (STIs)  You should be screened for sexually transmitted infections (STIs) including gonorrhea and chlamydia if:  You are sexually active and are younger than 76 years of age.  You are older than 76 years of age and your health care provider tells you that you are at risk for this type of infection.  Your sexual activity has changed since you were last screened and you are at an increased risk for chlamydia or gonorrhea. Ask your health care provider if you are at risk.  If you do not have HIV, but are at risk, it may be recommended that you take a prescription medicine daily to prevent HIV infection. This is called pre-exposure prophylaxis (PrEP). You are considered at risk if:  You are sexually active and do not regularly use condoms or know the HIV status of your partner(s).  You take drugs by injection.  You are sexually active with a partner who has HIV. Talk with your health care provider about whether you are at high risk of being infected with HIV. If you choose to begin PrEP, you should first be tested for HIV. You should then be tested every 3 months for as long as you are taking PrEP.  PREGNANCY   If you are premenopausal and you may become pregnant, ask your health care provider about preconception counseling.  If you may become pregnant, take 400 to 800 micrograms (mcg) of folic acid every day.  If you want to prevent pregnancy, talk to your health care provider about birth control (contraception). OSTEOPOROSIS AND MENOPAUSE   Osteoporosis is a disease in which the bones lose minerals and strength with aging. This can result in serious bone fractures. Your risk for osteoporosis can be identified using a bone density scan.  If  you are 55 years of age or older, or if you are at risk for osteoporosis and fractures, ask your health care provider if you should be screened.  Ask your health care provider whether you should take a calcium or vitamin D supplement to lower your risk for osteoporosis.  Menopause may have certain physical symptoms and risks.  Hormone replacement therapy may reduce some of these symptoms and risks. Talk to your health care provider about whether hormone replacement therapy is right for you.  HOME CARE  INSTRUCTIONS   Schedule regular health, dental, and eye exams.  Stay current with your immunizations.   Do not use any tobacco products including cigarettes, chewing tobacco, or electronic cigarettes.  If you are pregnant, do not drink alcohol.  If you are breastfeeding, limit how much and how often you drink alcohol.  Limit alcohol intake to no more than 1 drink per day for nonpregnant women. One drink equals 12 ounces of beer, 5 ounces of wine, or 1 ounces of hard liquor.  Do not use street drugs.  Do not share needles.  Ask your health care provider for help if you need support or information about quitting drugs.  Tell your health care provider if you often feel depressed.  Tell your health care provider if you have ever been abused or do not feel safe at home.   This information is not intended to replace advice given to you by your health care provider. Make sure you discuss any questions you have with your health care provider.   Document Released: 09/08/2010 Document Revised: 03/16/2014 Document Reviewed: 01/25/2013 Elsevier Interactive Patient Education 2016 [place] K. Lailah Marcelli M.D.

## 2015-12-09 ENCOUNTER — Ambulatory Visit: Payer: Medicare Other | Attending: Orthopedic Surgery

## 2015-12-09 ENCOUNTER — Encounter: Payer: Self-pay | Admitting: Internal Medicine

## 2015-12-09 ENCOUNTER — Ambulatory Visit (INDEPENDENT_AMBULATORY_CARE_PROVIDER_SITE_OTHER): Payer: Medicare Other | Admitting: Internal Medicine

## 2015-12-09 VITALS — BP 150/88 | HR 93 | Temp 97.7°F | Resp 16 | Ht 62.0 in | Wt 144.8 lb

## 2015-12-09 DIAGNOSIS — E2839 Other primary ovarian failure: Secondary | ICD-10-CM

## 2015-12-09 DIAGNOSIS — M25672 Stiffness of left ankle, not elsewhere classified: Secondary | ICD-10-CM | POA: Diagnosis present

## 2015-12-09 DIAGNOSIS — R600 Localized edema: Secondary | ICD-10-CM

## 2015-12-09 DIAGNOSIS — E785 Hyperlipidemia, unspecified: Secondary | ICD-10-CM | POA: Diagnosis not present

## 2015-12-09 DIAGNOSIS — E559 Vitamin D deficiency, unspecified: Secondary | ICD-10-CM | POA: Diagnosis not present

## 2015-12-09 DIAGNOSIS — Z23 Encounter for immunization: Secondary | ICD-10-CM | POA: Diagnosis not present

## 2015-12-09 DIAGNOSIS — Z8781 Personal history of (healed) traumatic fracture: Secondary | ICD-10-CM

## 2015-12-09 DIAGNOSIS — M6281 Muscle weakness (generalized): Secondary | ICD-10-CM

## 2015-12-09 DIAGNOSIS — Z0000 Encounter for general adult medical examination without abnormal findings: Secondary | ICD-10-CM

## 2015-12-09 DIAGNOSIS — R030 Elevated blood-pressure reading, without diagnosis of hypertension: Secondary | ICD-10-CM | POA: Diagnosis not present

## 2015-12-09 DIAGNOSIS — R2689 Other abnormalities of gait and mobility: Secondary | ICD-10-CM | POA: Diagnosis not present

## 2015-12-09 DIAGNOSIS — K5909 Other constipation: Secondary | ICD-10-CM

## 2015-12-09 MED ORDER — ATORVASTATIN CALCIUM 10 MG PO TABS: 10.0000 mg | ORAL_TABLET | Freq: Every day | ORAL | 3 refills | Status: DC

## 2015-12-09 NOTE — Therapy (Signed)
[facility] [address], [address] [place], [place], [address] Phone: [phone]   Fax:  [phone]  Physical Therapy Treatment  Patient Details  Name: Pamela Alvarez MRN: [id number] Date of Birth: 1940/12/05 Referring Provider: Dr. Teryl Lucy  Encounter Date: 12/09/2015      PT End of Session - 12/09/15 1531    Visit Number 20   Number of Visits 30   Date for PT Re-Evaluation 12/17/15   Authorization Type KX now   PT Start Time 1448   PT Stop Time 1546   PT Time Calculation (min) 58 min   Activity Tolerance Patient tolerated treatment well   Behavior During Therapy [facility] for tasks assessed/performed      Past Medical History:  Diagnosis Date  . Cyst 07-2008   Cyst on back x2 that were drained  . H/O echocardiogram 02-2000  . Hx of colonic polyps   . Hyperlipidemia     Past Surgical History:  Procedure Laterality Date  . POLYPECTOMY     Colon  . TONSILLECTOMY    . TUBAL LIGATION      There were no vitals filed for this visit.      Subjective Assessment - 12/09/15 1455    Subjective Bil. knee pain today- used a topical rub.  Walking with quad cane for all distances   Patient Stated Goals Get strength back , not walk with a walker, return to driving   Currently in Pain? No/denies                         [facility] Adult PT Treatment/Exercise - 12/09/15 0001      Ambulation/Gait   Ambulation/Gait Yes   Assistive device --  HHA to no device no HHA   Gait Pattern Step-through pattern;Decreased arm swing - left;Decreased stride length   Ambulation Surface Level   Gait Comments Work on speed, increasing step length, and stepping over low objects  HHA      Knee/Hip Exercises: Standing   Lateral Step Up Left;2 sets;10 reps;Hand Hold: 2   Forward Step Up Left;2 sets;15 reps;Hand Hold: 1   Rocker Board 3 minutes   Other Standing Knee Exercises side stepping, tandem walking, marching on level  surface 2x25 feet each      Knee/Hip Exercises: Seated   Long Arc Quad Strengthening;2 sets;10 reps   Long Arc Quad Weight 2 lbs.     Vasopneumatic   Number Minutes Vasopneumatic  15 minutes   Vasopnuematic Location  Ankle   Vasopneumatic Pressure High   Vasopneumatic Temperature  3 flakes     Ankle Exercises: Standing   Other Standing Ankle Exercises weight shifting on mini tramp 1 min 3 ways  Added marching on mini tramp 1 min     Ankle Exercises: Aerobic   Stationary Bike Nustep L3 x 10  min                  PT Short Term Goals - 11/25/15 1246      PT SHORT TERM GOAL #2   Title ability to walk with a cane using the CAM boot with step through step pattern due to increased weightbear on the left   Time 4   Period Weeks   Status Achieved  Can step through, just not 100% yet           PT Long Term Goals - 12/09/15 1456      PT LONG TERM  GOAL #1   Title independent with HEP   Time 8   Period Weeks   Status On-going     PT LONG TERM GOAL #2   Title ability to ambulate without an assistive device for 1000 feet so she is able to walk in the community with full weightbear on left   Time 8   Period Weeks   Status On-going     PT LONG TERM GOAL #3   Title left ankle AROM without normal limits so she is able to go up and down stairs with step over step pattern using  1 railing   Time 8   Period Weeks   Status On-going     PT LONG TERM GOAL #5   Title FOTO score is </= 48% limitation   Time 8   Period Weeks   Status On-going  54% limitation today               Plan - 12/09/15 1458    Clinical Impression Statement Pt with improved FOTO score of 54% limitation (improved from 75% at evaluation).  Pt continues to required assistance with gait and demosntrates reduced Lt ankle AROM and strength.  Pt with intermittent edema on the Lt associated with activity.  Pt will continue to benefit from skilled PT for Lt ankle strength, AROM, gait and edema  management.     Rehab Potential Excellent   PT Frequency 3x / week   PT Duration 8 weeks   PT Treatment/Interventions Cryotherapy;Electrical Stimulation;Functional mobility training;Stair training;Gait training;Ultrasound;Moist Heat;Therapeutic activities;Therapeutic exercise;Balance training;Neuromuscular re-education;Patient/family education;Passive range of motion;Manual techniques;Dry needling;Vasopneumatic Device   PT Next Visit Plan Continue with gait training without device   Consulted and Agree with Plan of Care Patient      Patient will benefit from skilled therapeutic intervention in order to improve the following deficits and impairments:  Abnormal gait, Decreased range of motion, Difficulty walking, Increased fascial restricitons, Decreased endurance, Pain, Decreased activity tolerance, Decreased balance, Hypomobility, Impaired flexibility, Decreased strength, Decreased mobility, Increased edema  Visit Diagnosis: Other abnormalities of gait and mobility  Stiffness of left ankle, not elsewhere classified  Localized edema  Muscle weakness (generalized)       G-Codes - 12/09/15 1452    Functional Assessment Tool Used FOTO: 54% limitation   Functional Limitation Mobility: Walking and moving around   Mobility: Walking and Moving Around Current Status [phone]([id number]) At least 40 percent but less than 60 percent impaired, limited or restricted   Mobility: Walking and Moving Around Goal Status [phone]([id number]) At least 40 percent but less than 60 percent impaired, limited or restricted      Problem List Patient Active Problem List   Diagnosis Date Noted  . Elevated BP 07/27/2014  . Bunion of left foot 07/26/2014  . Abnormality of gait 07/26/2014  . Cough 11/10/2013  . Post-nasal drainage 11/10/2013  . Abnormal urine odor 11/10/2013  . Visit for preventive health examination 07/28/2013  . Right anterior knee pain 07/22/2012  . Medicare annual wellness visit, subsequent 07/25/2011  .  ANXIETY, SITUATIONAL 09/25/2009  . ARTHRITIS 09/25/2009  . Vitamin D deficiency 09/27/2008  . Hyperlipidemia 09/17/2008  . Elevated blood pressure reading without diagnosis of hypertension 09/17/2008  . COLONIC POLYPS, HX OF 09/17/2008  . PERSONAL HISTORY DISEASES SKIN&SUBCUT TISSUE 09/17/2008     Lorrene ReidKelly Takacs, PT 12/09/15 3:34 PM  [facility] [address] [place], [place], [address] Phone: [phone]   Fax:  [phone]  Name:  Pamela Alvarez MRN: [id number] Date of Birth: 1940-10-27

## 2015-12-09 NOTE — Patient Instructions (Addendum)
Glad you are doing better. Begin taking 800 with thousand units of vitamin D a day for bone health protection. Get an appointment for bone density or DEXA scan. This will be at the [facility] office building I will put order in that is good for a year and you should call to get an appointment time when you are ready. Your blood work this month was good except your cholesterol was very high. I advise going back on the Lipitor and we can follow this up to make sure the level is coming down. Blood pressure is controlled control is very important to prevent stroke and heart attack. Take blood pressure readings twice a day for 7- 10 days and then periodically .To ensure below 140/90   . If your blood pressure is not at goal please begin the losartan 25 mg a day as we discussed.  Advise  ROV in 3-4 months with lipid and bmp vit D  pre visit  To recheck cholesterol and BP situation .  Miralax 1-2 capful per day  And titrate   Health Maintenance, Female Adopting a healthy lifestyle and getting preventive care can go a long way to promote health and wellness. Talk with your health care provider about what schedule of regular examinations is right for you. This is a good chance for you to check in with your provider about disease prevention and staying healthy. In between checkups, there are plenty of things you can do on your own. Experts have done a lot of research about which lifestyle changes and preventive measures are most likely to keep you healthy. Ask your health care provider for more information. WEIGHT AND DIET  Eat a healthy diet  Be sure to include plenty of vegetables, fruits, low-fat dairy products, and lean protein.  Do not eat a lot of foods high in solid fats, added sugars, or salt.  Get regular exercise. This is one of the most important things you can do for your health.  Most adults should exercise for at least 150 minutes each week. The exercise should increase your heart rate and make  you sweat (moderate-intensity exercise).  Most adults should also do strengthening exercises at least twice a week. This is in addition to the moderate-intensity exercise.  Maintain a healthy weight  Body mass index (BMI) is a measurement that can be used to identify possible weight problems. It estimates body fat based on height and weight. Your health care provider can help determine your BMI and help you achieve or maintain a healthy weight.  For females 57 years of age and older:   A BMI below 18.5 is considered underweight.  A BMI of 18.5 to 24.9 is normal.  A BMI of 25 to 29.9 is considered overweight.  A BMI of 30 and above is considered obese.  Watch levels of cholesterol and blood lipids  You should start having your blood tested for lipids and cholesterol at 76 years of age, then have this test every 5 years.  You may need to have your cholesterol levels checked more often if:  Your lipid or cholesterol levels are high.  You are older than 76 years of age.  You are at high risk for heart disease.  CANCER SCREENING   Lung Cancer  Lung cancer screening is recommended for adults 34-23 years old who are at high risk for lung cancer because of a history of smoking.  A yearly low-dose CT scan of the lungs is recommended for people who:  Currently smoke.  Have quit within the past 15 years.  Have at least a 30-pack-year history of smoking. A pack year is smoking an average of one pack of cigarettes a day for 1 year.  Yearly screening should continue until it has been 15 years since you quit.  Yearly screening should stop if you develop a health problem that would prevent you from having lung cancer treatment.  Breast Cancer  Practice breast self-awareness. This means understanding how your breasts normally appear and feel.  It also means doing regular breast self-exams. Let your health care provider know about any changes, no matter how small.  If you are in  your 20s or 30s, you should have a clinical breast exam (CBE) by a health care provider every 1-3 years as part of a regular health exam.  If you are 17 or older, have a CBE every year. Also consider having a breast X-ray (mammogram) every year.  If you have a family history of breast cancer, talk to your health care provider about genetic screening.  If you are at high risk for breast cancer, talk to your health care provider about having an MRI and a mammogram every year.  Breast cancer gene (BRCA) assessment is recommended for women who have family members with BRCA-related cancers. BRCA-related cancers include:  Breast.  Ovarian.  Tubal.  Peritoneal cancers.  Results of the assessment will determine the need for genetic counseling and BRCA1 and BRCA2 testing. Cervical Cancer Your health care provider may recommend that you be screened regularly for cancer of the pelvic organs (ovaries, uterus, and vagina). This screening involves a pelvic examination, including checking for microscopic changes to the surface of your cervix (Pap test). You may be encouraged to have this screening done every 3 years, beginning at age 86.  For women ages 64-65, health care providers may recommend pelvic exams and Pap testing every 3 years, or they may recommend the Pap and pelvic exam, combined with testing for human papilloma virus (HPV), every 5 years. Some types of HPV increase your risk of cervical cancer. Testing for HPV may also be done on women of any age with unclear Pap test results.  Other health care providers may not recommend any screening for nonpregnant women who are considered low risk for pelvic cancer and who do not have symptoms. Ask your health care provider if a screening pelvic exam is right for you.  If you have had past treatment for cervical cancer or a condition that could lead to cancer, you need Pap tests and screening for cancer for at least 20 years after your treatment. If  Pap tests have been discontinued, your risk factors (such as having a new sexual partner) need to be reassessed to determine if screening should resume. Some women have medical problems that increase the chance of getting cervical cancer. In these cases, your health care provider may recommend more frequent screening and Pap tests. Colorectal Cancer  This type of cancer can be detected and often prevented.  Routine colorectal cancer screening usually begins at 76 years of age and continues through 76 years of age.  Your health care provider may recommend screening at an earlier age if you have risk factors for colon cancer.  Your health care provider may also recommend using home test kits to check for hidden blood in the stool.  A small camera at the end of a tube can be used to examine your colon directly (sigmoidoscopy or colonoscopy). This is  done to check for the earliest forms of colorectal cancer.  Routine screening usually begins at age 35.  Direct examination of the colon should be repeated every 5-10 years through 76 years of age. However, you may need to be screened more often if early forms of precancerous polyps or small growths are found. Skin Cancer  Check your skin from head to toe regularly.  Tell your health care provider about any new moles or changes in moles, especially if there is a change in a mole's shape or color.  Also tell your health care provider if you have a mole that is larger than the size of a pencil eraser.  Always use sunscreen. Apply sunscreen liberally and repeatedly throughout the day.  Protect yourself by wearing long sleeves, pants, a wide-brimmed hat, and sunglasses whenever you are outside. HEART DISEASE, DIABETES, AND HIGH BLOOD PRESSURE   High blood pressure causes heart disease and increases the risk of stroke. High blood pressure is more likely to develop in:  People who have blood pressure in the high end of the normal range  (130-139/85-89 mm Hg).  People who are overweight or obese.  People who are African American.  If you are 8-74 years of age, have your blood pressure checked every 3-5 years. If you are 55 years of age or older, have your blood pressure checked every year. You should have your blood pressure measured twice--once when you are at a hospital or clinic, and once when you are not at a hospital or clinic. Record the average of the two measurements. To check your blood pressure when you are not at a hospital or clinic, you can use:  An automated blood pressure machine at a pharmacy.  A home blood pressure monitor.  If you are between 73 years and 60 years old, ask your health care provider if you should take aspirin to prevent strokes.  Have regular diabetes screenings. This involves taking a blood sample to check your fasting blood sugar level.  If you are at a normal weight and have a low risk for diabetes, have this test once every three years after 76 years of age.  If you are overweight and have a high risk for diabetes, consider being tested at a younger age or more often. PREVENTING INFECTION  Hepatitis B  If you have a higher risk for hepatitis B, you should be screened for this virus. You are considered at high risk for hepatitis B if:  You were born in a country where hepatitis B is common. Ask your health care provider which countries are considered high risk.  Your parents were born in a high-risk country, and you have not been immunized against hepatitis B (hepatitis B vaccine).  You have HIV or AIDS.  You use needles to inject street drugs.  You live with someone who has hepatitis B.  You have had sex with someone who has hepatitis B.  You get hemodialysis treatment.  You take certain medicines for conditions, including cancer, organ transplantation, and autoimmune conditions. Hepatitis C  Blood testing is recommended for:  Everyone born from [birth year] through  1966.  Anyone with known risk factors for hepatitis C. Sexually transmitted infections (STIs)  You should be screened for sexually transmitted infections (STIs) including gonorrhea and chlamydia if:  You are sexually active and are younger than 76 years of age.  You are older than 76 years of age and your health care provider tells you that you are at risk  for this type of infection.  Your sexual activity has changed since you were last screened and you are at an increased risk for chlamydia or gonorrhea. Ask your health care provider if you are at risk.  If you do not have HIV, but are at risk, it may be recommended that you take a prescription medicine daily to prevent HIV infection. This is called pre-exposure prophylaxis (PrEP). You are considered at risk if:  You are sexually active and do not regularly use condoms or know the HIV status of your partner(s).  You take drugs by injection.  You are sexually active with a partner who has HIV. Talk with your health care provider about whether you are at high risk of being infected with HIV. If you choose to begin PrEP, you should first be tested for HIV. You should then be tested every 3 months for as long as you are taking PrEP.  PREGNANCY   If you are premenopausal and you may become pregnant, ask your health care provider about preconception counseling.  If you may become pregnant, take 400 to 800 micrograms (mcg) of folic acid every day.  If you want to prevent pregnancy, talk to your health care provider about birth control (contraception). OSTEOPOROSIS AND MENOPAUSE   Osteoporosis is a disease in which the bones lose minerals and strength with aging. This can result in serious bone fractures. Your risk for osteoporosis can be identified using a bone density scan.  If you are 35 years of age or older, or if you are at risk for osteoporosis and fractures, ask your health care provider if you should be screened.  Ask your health  care provider whether you should take a calcium or vitamin D supplement to lower your risk for osteoporosis.  Menopause may have certain physical symptoms and risks.  Hormone replacement therapy may reduce some of these symptoms and risks. Talk to your health care provider about whether hormone replacement therapy is right for you.  HOME CARE INSTRUCTIONS   Schedule regular health, dental, and eye exams.  Stay current with your immunizations.   Do not use any tobacco products including cigarettes, chewing tobacco, or electronic cigarettes.  If you are pregnant, do not drink alcohol.  If you are breastfeeding, limit how much and how often you drink alcohol.  Limit alcohol intake to no more than 1 drink per day for nonpregnant women. One drink equals 12 ounces of beer, 5 ounces of wine, or 1 ounces of hard liquor.  Do not use street drugs.  Do not share needles.  Ask your health care provider for help if you need support or information about quitting drugs.  Tell your health care provider if you often feel depressed.  Tell your health care provider if you have ever been abused or do not feel safe at home.   This information is not intended to replace advice given to you by your health care provider. Make sure you discuss any questions you have with your health care provider.   Document Released: 09/08/2010 Document Revised: 03/16/2014 Document Reviewed: 01/25/2013 Elsevier Interactive Patient Education [employer].

## 2015-12-09 NOTE — Assessment & Plan Note (Signed)
With history of fracture go back on vitamin D 802,000 units a day recheck a level before your next visit in 3-4 months.

## 2015-12-09 NOTE — Assessment & Plan Note (Signed)
LDL over 100 seem to tolerate 10 mg atorvastatin go back on medication repeat lipids in 3-4 months in follow-up visit.

## 2015-12-11 ENCOUNTER — Ambulatory Visit: Payer: Medicare Other | Admitting: Physical Therapy

## 2015-12-11 ENCOUNTER — Encounter: Payer: Self-pay | Admitting: Physical Therapy

## 2015-12-11 DIAGNOSIS — R600 Localized edema: Secondary | ICD-10-CM

## 2015-12-11 DIAGNOSIS — M25672 Stiffness of left ankle, not elsewhere classified: Secondary | ICD-10-CM

## 2015-12-11 DIAGNOSIS — R2689 Other abnormalities of gait and mobility: Secondary | ICD-10-CM | POA: Diagnosis not present

## 2015-12-11 DIAGNOSIS — M6281 Muscle weakness (generalized): Secondary | ICD-10-CM

## 2015-12-11 NOTE — Therapy (Signed)
[facility] [address] [place], [place], [address] Phone: [phone]   Fax:  [phone]  Physical Therapy Treatment  Patient Details  Name: Pamela Alvarez MRN: [id number] Date of Birth: 1941/01/29 Referring Provider: Dr. Teryl Lucy  Encounter Date: 12/11/2015      PT End of Session - 12/11/15 1405    Visit Number 21   Number of Visits 30   Date for PT Re-Evaluation 12/17/15   Authorization Type KX now   PT Start Time 1400   PT Stop Time 1441   PT Time Calculation (min) 41 min   Activity Tolerance Patient tolerated treatment well   Behavior During Therapy [facility] for tasks assessed/performed      Past Medical History:  Diagnosis Date  . Cyst 07-2008   Cyst on back x2 that were drained  . H/O echocardiogram 02-2000  . Hx of colonic polyps   . Hyperlipidemia     Past Surgical History:  Procedure Laterality Date  . POLYPECTOMY     Colon  . TONSILLECTOMY    . TUBAL LIGATION      There were no vitals filed for this visit.      Subjective Assessment - 12/11/15 1404    Subjective Saw PA this morning. Xray was perfect and pt could decided what to do about PT.   Currently in Pain? No/denies   Multiple Pain Sites No                         [facility] Adult PT Treatment/Exercise - 12/11/15 0001      Ambulation/Gait   Ambulation/Gait Yes   Ambulation Surface Level   Gait Comments Carrying objects, increasing step length LT., and speed  SBA only, no LOB     Knee/Hip Exercises: Standing   Hip Abduction Stengthening;Both;2 sets;10 reps;Knee straight     Knee/Hip Exercises: Seated   Sit to Sand 10 reps;without UE support  VC to weight bear into the balss of the feet more     Ankle Exercises: Aerobic   Stationary Bike Nustep L2 x 10 min     Ankle Exercises: Standing   SLS With RTLE toe taps on first stai 3 x10, then static 3x 15 with VC to contract quad   First set with light UE on  rails, second set with RTUE only   Heel Raises 20 reps   Other Standing Ankle Exercises weight shifting on mini tramp 1 min 3 ways  Added marching on mini tramp 1 min                  PT Short Term Goals - 11/25/15 1246      PT SHORT TERM GOAL #2   Title ability to walk with a cane using the CAM boot with step through step pattern due to increased weightbear on the left   Time 4   Period Weeks   Status Achieved  Can step through, just not 100% yet           PT Long Term Goals - 12/09/15 1456      PT LONG TERM GOAL #1   Title independent with HEP   Time 8   Period Weeks   Status On-going     PT LONG TERM GOAL #2   Title ability to ambulate without an assistive device for 1000 feet so she is able to walk in the community with full weightbear on left  Time 8   Period Weeks   Status On-going     PT LONG TERM GOAL #3   Title left ankle AROM without normal limits so she is able to go up and down stairs with step over step pattern using  1 railing   Time 8   Period Weeks   Status On-going     PT LONG TERM GOAL #5   Title FOTO score is </= 48% limitation   Time 8   Period Weeks   Status On-going  54% limitation today               Plan - 12/11/15 1406    Clinical Impression Statement Pt reports having an xray this AM showing excellent healing to her ankle. MD suggested reducing PT frequency. Worked primarily on gait without device today, increasing confidence, LT step & stride length, and overall speed.    Rehab Potential Excellent   Clinical Impairments Affecting Rehab Potential none   PT Frequency 3x / week   PT Duration 8 weeks   PT Treatment/Interventions Cryotherapy;Electrical Stimulation;Functional mobility training;Stair training;Gait training;Ultrasound;Moist Heat;Therapeutic activities;Therapeutic exercise;Balance training;Neuromuscular re-education;Patient/family education;Passive range of motion;Manual techniques;Dry needling;Vasopneumatic  Device   PT Next Visit Plan Pt made comment she may want Monday to be last day.  Continue to work on gait without device.    Consulted and Agree with Plan of Care --      Patient will benefit from skilled therapeutic intervention in order to improve the following deficits and impairments:  Abnormal gait, Decreased range of motion, Difficulty walking, Increased fascial restricitons, Decreased endurance, Pain, Decreased activity tolerance, Decreased balance, Hypomobility, Impaired flexibility, Decreased strength, Decreased mobility, Increased edema  Visit Diagnosis: Other abnormalities of gait and mobility  Stiffness of left ankle, not elsewhere classified  Localized edema  Muscle weakness (generalized)     Problem List Patient Active Problem List   Diagnosis Date Noted  . Elevated BP 07/27/2014  . Bunion of left foot 07/26/2014  . Abnormality of gait 07/26/2014  . Cough 11/10/2013  . Post-nasal drainage 11/10/2013  . Abnormal urine odor 11/10/2013  . Visit for preventive health examination 07/28/2013  . Right anterior knee pain 07/22/2012  . Medicare annual wellness visit, subsequent 07/25/2011  . ANXIETY, SITUATIONAL 09/25/2009  . ARTHRITIS 09/25/2009  . Vitamin D deficiency 09/27/2008  . Hyperlipidemia 09/17/2008  . Elevated blood pressure reading without diagnosis of hypertension 09/17/2008  . COLONIC POLYPS, HX OF 09/17/2008  . PERSONAL HISTORY DISEASES SKIN&SUBCUT TISSUE 09/17/2008    Ketura Sirek, PTA 12/11/2015, 2:46 PM  [facility] [address], [address] [place], [place], [address] Phone: [phone]   Fax:  [phone]  Name: Lorie ApleySandra Mavity MRN: [id number] Date of Birth: 01/21/1941

## 2015-12-11 NOTE — Patient Instructions (Signed)
Home exercises:  1. Heel raises; 20x  2. Calf stretch at kitchen counter: Do both calfs: 2x 30 sec.   3. Single leg balance: 3 x 20-30 sec  4. Practice your walking without the cane in the house, taking normal steps not small choppy ones.

## 2015-12-13 ENCOUNTER — Encounter: Payer: Self-pay | Admitting: Physical Therapy

## 2015-12-13 ENCOUNTER — Ambulatory Visit: Payer: Medicare Other | Admitting: Physical Therapy

## 2015-12-13 DIAGNOSIS — R2689 Other abnormalities of gait and mobility: Secondary | ICD-10-CM

## 2015-12-13 DIAGNOSIS — M25672 Stiffness of left ankle, not elsewhere classified: Secondary | ICD-10-CM

## 2015-12-13 DIAGNOSIS — R600 Localized edema: Secondary | ICD-10-CM

## 2015-12-13 DIAGNOSIS — M6281 Muscle weakness (generalized): Secondary | ICD-10-CM

## 2015-12-13 NOTE — Therapy (Signed)
[facility] [address], [address] [place], [place], [address] Phone: [phone]   Fax:  [phone]  Physical Therapy Treatment  Patient Details  Name: Pamela Alvarez MRN: [id number] Date of Birth: 07/04/1940 Referring Provider: Dr. Teryl Lucy  Encounter Date: 12/13/2015      PT End of Session - 12/13/15 1024    Visit Number 22   Number of Visits 30   Date for PT Re-Evaluation 12/17/15   Authorization Type KX now   PT Start Time 1020  Pt's husband had appt so they had to leave a tad early.    PT Stop Time 1052   PT Time Calculation (min) 32 min   Activity Tolerance Patient tolerated treatment well   Behavior During Therapy [facility] for tasks assessed/performed      Past Medical History:  Diagnosis Date  . Cyst 07-2008   Cyst on back x2 that were drained  . H/O echocardiogram 02-2000  . Hx of colonic polyps   . Hyperlipidemia     Past Surgical History:  Procedure Laterality Date  . POLYPECTOMY     Colon  . TONSILLECTOMY    . TUBAL LIGATION      There were no vitals filed for this visit.      Subjective Assessment - 12/13/15 1022    Subjective Pt would like to come 1x week for a month until her next MD appt. Pt did discuss knee pain with MD but chose not to pursue treatment.    Currently in Pain? No/denies  Knees continue to hurt at night.    Multiple Pain Sites No                         [facility] Adult PT Treatment/Exercise - 12/13/15 0001      Ambulation/Gait   Ambulation/Gait Yes   Assistive device --  none   Ambulation Surface Level   Gait Comments Carrying objects, increasing step length LT., and speed  SBA only, no LOB     Knee/Hip Exercises: Seated   Sit to Sand 20 reps;without UE support  VC to weight bear > into LTLE and find the balls of the feet     Ankle Exercises: Aerobic   Stationary Bike Nustep x 10 min  Pt requested level 0 for her knees.      Ankle Exercises:  Standing   SLS With RTLE toe taps on first stai 3 x10, then static 3x 15 with VC to contract quad   First set with light UE on rails, second set with RTUE only   Heel Raises 20 reps   Other Standing Ankle Exercises weight shifting on mini tramp 1 min 3 ways  Added marching on mini tramp 1 min     Ankle Exercises: Stretches   Slant Board Stretch 3 reps;30 seconds   Other Stretch Ankle closed chain stretch on stairs x 1 min                  PT Short Term Goals - 11/25/15 1246      PT SHORT TERM GOAL #2   Title ability to walk with a cane using the CAM boot with step through step pattern due to increased weightbear on the left   Time 4   Period Weeks   Status Achieved  Can step through, just not 100% yet           PT Long Term Goals - 12/09/15 1456  PT LONG TERM GOAL #1   Title independent with HEP   Time 8   Period Weeks   Status On-going     PT LONG TERM GOAL #2   Title ability to ambulate without an assistive device for 1000 feet so she is able to walk in the community with full weightbear on left   Time 8   Period Weeks   Status On-going     PT LONG TERM GOAL #3   Title left ankle AROM without normal limits so she is able to go up and down stairs with step over step pattern using  1 railing   Time 8   Period Weeks   Status On-going     PT LONG TERM GOAL #5   Title FOTO score is </= 48% limitation   Time 8   Period Weeks   Status On-going  54% limitation today               Plan - 12/13/15 1024    Clinical Impression Statement Pt showing improvement in her gait: taking better steps better heel strike despite needing verbal cuing to remind her of these things.  Her confidence has improved immensely. pt was encouraged to stretch more at home. Pt did not require any hand held assitance this week in PT.     Rehab Potential Excellent   Clinical Impairments Affecting Rehab Potential none   PT Frequency 3x / week   PT Duration 8 weeks   PT  Treatment/Interventions Cryotherapy;Electrical Stimulation;Functional mobility training;Stair training;Gait training;Ultrasound;Moist Heat;Therapeutic activities;Therapeutic exercise;Balance training;Neuromuscular re-education;Patient/family education;Passive range of motion;Manual techniques;Dry needling;Vasopneumatic Device   PT Next Visit Plan Pt wants to come 1x week for a month, until her next MD appt to get off cane 100%.   Consulted and Agree with Plan of Care Patient      Patient will benefit from skilled therapeutic intervention in order to improve the following deficits and impairments:  Abnormal gait, Decreased range of motion, Difficulty walking, Increased fascial restricitons, Decreased endurance, Pain, Decreased activity tolerance, Decreased balance, Hypomobility, Impaired flexibility, Decreased strength, Decreased mobility, Increased edema  Visit Diagnosis: Other abnormalities of gait and mobility  Stiffness of left ankle, not elsewhere classified  Localized edema  Muscle weakness (generalized)     Problem List Patient Active Problem List   Diagnosis Date Noted  . Elevated BP 07/27/2014  . Bunion of left foot 07/26/2014  . Abnormality of gait 07/26/2014  . Cough 11/10/2013  . Post-nasal drainage 11/10/2013  . Abnormal urine odor 11/10/2013  . Visit for preventive health examination 07/28/2013  . Right anterior knee pain 07/22/2012  . Medicare annual wellness visit, subsequent 07/25/2011  . ANXIETY, SITUATIONAL 09/25/2009  . ARTHRITIS 09/25/2009  . Vitamin D deficiency 09/27/2008  . Hyperlipidemia 09/17/2008  . Elevated blood pressure reading without diagnosis of hypertension 09/17/2008  . COLONIC POLYPS, HX OF 09/17/2008  . PERSONAL HISTORY DISEASES SKIN&SUBCUT TISSUE 09/17/2008    Mechel Haggard, PTA 12/13/2015, 10:58 AM  [facility] [address], [address] [place], [place], [address] Phone:  [phone]   Fax:  [phone]  Name: Pamela Alvarez MRN: [id number] Date of Birth: 03/03/1941

## 2015-12-16 ENCOUNTER — Ambulatory Visit: Payer: Medicare Other | Admitting: Physical Therapy

## 2015-12-16 DIAGNOSIS — M6281 Muscle weakness (generalized): Secondary | ICD-10-CM

## 2015-12-16 DIAGNOSIS — M25672 Stiffness of left ankle, not elsewhere classified: Secondary | ICD-10-CM

## 2015-12-16 DIAGNOSIS — R2689 Other abnormalities of gait and mobility: Secondary | ICD-10-CM | POA: Diagnosis not present

## 2015-12-16 NOTE — Therapy (Signed)
[facility] [address] [place], [place], [address] Phone: [phone]   Fax:  [phone]  Physical Therapy Treatment  Patient Details  Name: Pamela Alvarez MRN: [id number] Date of Birth: 06-23-1940 Referring Provider: Dr. Teryl Lucy  Encounter Date: 12/16/2015      PT End of Session - 12/16/15 1014    Visit Number 23   Number of Visits 30   Date for PT Re-Evaluation 01/14/16   Authorization Type KX now   PT Start Time 0930   PT Stop Time 1013   PT Time Calculation (min) 43 min   Activity Tolerance Patient tolerated treatment well   Behavior During Therapy [facility] for tasks assessed/performed      Past Medical History:  Diagnosis Date  . Cyst 07-2008   Cyst on back x2 that were drained  . H/O echocardiogram 02-2000  . Hx of colonic polyps   . Hyperlipidemia     Past Surgical History:  Procedure Laterality Date  . POLYPECTOMY     Colon  . TONSILLECTOMY    . TUBAL LIGATION      There were no vitals filed for this visit.      Subjective Assessment - 12/16/15 0938    Subjective I see the doctor in 1 month.    Limitations Walking   Patient Stated Goals Get strength back , not walk with a walker, return to driving   Currently in Pain? Yes   Pain Score 2    Pain Location Knee   Pain Orientation Right   Pain Descriptors / Indicators Aching   Pain Type Acute pain   Pain Onset More than a month ago   Pain Frequency Intermittent   Aggravating Factors  standing   Pain Relieving Factors not on leg   Multiple Pain Sites No            [facility] PT Assessment - 12/16/15 0001      Assessment   Medical Diagnosis S/P distal fibula fracture   Referring Provider Dr. Teryl Lucy   Onset Date/Surgical Date 09/11/15   Prior Therapy None     Precautions   Precautions None     Restrictions   Weight Bearing Restrictions No     Balance Screen   Has the patient fallen in the past 6 months Yes   How many  times? 2  going down stairs   Has the patient had a decrease in activity level because of a fear of falling?  No   Is the patient reluctant to leave their home because of a fear of falling?  No     Home [employer]ist information centre manager residence     Prior Function   Level of Independence Independent   Vocation Retired     Copy Status Within Functional Limits for tasks assessed     ROM / Strength   AROM / PROM / Strength AROM;PROM;Strength     AROM   Left Ankle Dorsiflexion 5   Left Ankle Plantar Flexion 45   Left Ankle Inversion 5   Left Ankle Eversion 8     PROM   Left Ankle Dorsiflexion 10   Left Ankle Plantar Flexion 45   Left Ankle Inversion 8   Left Ankle Eversion 15     Strength   Left Ankle Dorsiflexion 4/5   Left Ankle Plantar Flexion 3+/5   Left Ankle Inversion 4/5   Left Ankle Eversion 4/5  Ambulation/Gait   Ambulation/Gait Yes   Gait Pattern Decreased dorsiflexion - left;Decreased weight shift to left;Wide base of support   Ambulation Surface Level   Stairs Yes   Stair Management Technique Step to pattern;Two rails                     [facility] Adult PT Treatment/Exercise - 12/16/15 0001      Knee/Hip Exercises: Standing   Lateral Step Up Left;20 reps   Lateral Step Up Limitations hands only for balance   Forward Step Up Left;Step Height: 2";15 reps     Knee/Hip Exercises: Seated   Sit to Sand 20 reps;without UE support  VC to weight bear > into LTLE and find the balls of the feet     Manual Therapy   Manual Therapy Soft tissue mobilization;Joint mobilization   Joint Mobilization distraction of calcaneus; anterior and posterior glide of calcaneus; mobilization fo the joints of the left foot   Soft tissue mobilization to left calf, left ankle, left anterior tibialis   Passive ROM left ankle for all motions     Ankle Exercises: Standing   Other Standing Ankle Exercises weight shifting on mini tramp 1  min 3 ways  Added marching on mini tramp 1 min                PT Education - 12/16/15 1014    Education provided No          PT Short Term Goals - 12/16/15 0950      PT SHORT TERM GOAL #1   Title independent with initial HEP   Time 4   Period Weeks   Status Achieved     PT SHORT TERM GOAL #2   Title ability to walk with a cane using the CAM boot with step through step pattern due to increased weightbear on the left   Time 4   Period Weeks   Status Achieved     PT SHORT TERM GOAL #3   Title improve left ankle AROM >/= 5 degrees so she is able to ambulate with less pain.   Time 4   Period Weeks   Status Achieved     PT SHORT TERM GOAL #4   Title swelling in left ankle decreases >/= 3 cm due to decreased edema   Time 4   Period Weeks   Status Achieved           PT Long Term Goals - 12/16/15 1610      PT LONG TERM GOAL #1   Title independent with HEP   Time 8   Period Weeks   Status On-going  still learning     PT LONG TERM GOAL #2   Title ability to ambulate without an assistive device for 1000 feet so she is able to walk in the community with full weightbear on left   Time 8   Period Weeks   Status On-going  just started today     PT LONG TERM GOAL #3   Title left ankle AROM without normal limits so she is able to go up and down stairs with step over step pattern using  1 railing   Time 8   Period Weeks   Status On-going  still doing step to step pattern     PT LONG TERM GOAL #4   Title left ankle strength >/= 4/5 so she is able to return to pilates exercise class   Time 8  Period Weeks   Status On-going     PT LONG TERM GOAL #5   Title FOTO score is </= 48% limitation   Time 8   Period Weeks   Status On-going               Plan - 12/16/15 1015    Clinical Impression Statement Patient has decreased strength of left ankle, and decreased ROM of left ankle. Patient ambulates with decreased left ankle dorsiflexion, decreased  plantarflexion, foot turned outward, decreased weight shift to the left.  Patient goes up and down steps with step to step pattern due to decreased ROM and weight shift to the left.  Patient has tightness in left ankle and muscles of left lower leg. Patient will benefit from skilled therapy to improve left ankle ROM and strength to improve gait and stairs.    Rehab Potential Excellent   Clinical Impairments Affecting Rehab Potential none   PT Frequency 1x / week   PT Duration 4 weeks   PT Treatment/Interventions Cryotherapy;Electrical Stimulation;Functional mobility training;Stair training;Gait training;Ultrasound;Moist Heat;Therapeutic activities;Therapeutic exercise;Balance training;Neuromuscular re-education;Patient/family education;Passive range of motion;Manual techniques;Dry needling;Vasopneumatic Device   PT Next Visit Plan increase ROM and strength; work on gait and stairs   PT Home Exercise Plan progress as needed   Consulted and Agree with Plan of Care Patient      Patient will benefit from skilled therapeutic intervention in order to improve the following deficits and impairments:  Abnormal gait, Decreased range of motion, Difficulty walking, Increased fascial restricitons, Decreased endurance, Pain, Decreased activity tolerance, Decreased balance, Hypomobility, Impaired flexibility, Decreased strength, Decreased mobility, Increased edema  Visit Diagnosis: Other abnormalities of gait and mobility - Plan: PT plan of care cert/re-cert  Stiffness of left ankle, not elsewhere classified - Plan: PT plan of care cert/re-cert  Muscle weakness (generalized) - Plan: PT plan of care cert/re-cert     Problem List Patient Active Problem List   Diagnosis Date Noted  . Elevated BP 07/27/2014  . Bunion of left foot 07/26/2014  . Abnormality of gait 07/26/2014  . Cough 11/10/2013  . Post-nasal drainage 11/10/2013  . Abnormal urine odor 11/10/2013  . Visit for preventive health examination  07/28/2013  . Right anterior knee pain 07/22/2012  . Medicare annual wellness visit, subsequent 07/25/2011  . ANXIETY, SITUATIONAL 09/25/2009  . ARTHRITIS 09/25/2009  . Vitamin D deficiency 09/27/2008  . Hyperlipidemia 09/17/2008  . Elevated blood pressure reading without diagnosis of hypertension 09/17/2008  . COLONIC POLYPS, HX OF 09/17/2008  . PERSONAL HISTORY DISEASES SKIN&SUBCUT TISSUE 09/17/2008    Eulis Fosterheryl Valoree Agent, PT 12/16/15 10:21 AM   [facility] [address], [address] [place], [place], [address] Phone: [phone]   Fax:  [phone]  Name: Pamela Alvarez MRN: [id number] Date of Birth: 12/08/1940

## 2015-12-17 ENCOUNTER — Other Ambulatory Visit: Payer: Self-pay | Admitting: Internal Medicine

## 2015-12-19 NOTE — Telephone Encounter (Signed)
Sent to the pharmacy by e-scribe for 90 days. Pt due back for lab work and follow up in 3 months.

## 2015-12-23 ENCOUNTER — Encounter: Payer: Self-pay | Admitting: Physical Therapy

## 2015-12-23 ENCOUNTER — Ambulatory Visit: Payer: Medicare Other | Admitting: Physical Therapy

## 2015-12-23 DIAGNOSIS — M6281 Muscle weakness (generalized): Secondary | ICD-10-CM

## 2015-12-23 DIAGNOSIS — M25672 Stiffness of left ankle, not elsewhere classified: Secondary | ICD-10-CM

## 2015-12-23 DIAGNOSIS — R2689 Other abnormalities of gait and mobility: Secondary | ICD-10-CM

## 2015-12-23 NOTE — Therapy (Signed)
[facility] [address], [place], [place], [address] Phone: [phone]   Fax:  [phone]  Physical Therapy Treatment  Patient Details  Name: Pamela Alvarez MRN: [id number] Date of Birth: 01-Feb-1941 Referring Provider: Dr. Marchia Bond  Encounter Date: 12/23/2015      PT End of Session - 12/23/15 1451    Visit Number 24   Number of Visits 30   Date for PT Re-Evaluation 01/14/16   Authorization Type KX now   PT Start Time 1450   PT Stop Time 1528   PT Time Calculation (min) 38 min   Activity Tolerance Patient tolerated treatment well   Behavior During Therapy [facility] for tasks assessed/performed      Past Medical History:  Diagnosis Date  . Cyst 07-2008   Cyst on back x2 that were drained  . H/O echocardiogram 02-2000  . Hx of colonic polyps   . Hyperlipidemia     Past Surgical History:  Procedure Laterality Date  . POLYPECTOMY     Colon  . TONSILLECTOMY    . TUBAL LIGATION      There were no vitals filed for this visit.      Subjective Assessment - 12/23/15 1451    Subjective Wearing my compression garment again. This helps the swelling.   Currently in Pain? No/denies   Multiple Pain Sites No                         [facility] Adult PT Treatment/Exercise - 12/23/15 0001      Manual Therapy   Passive ROM left ankle for all motions     Ankle Exercises: Aerobic   Stationary Bike Nustep L0 x 5 min     Ankle Exercises: Standing   SLS Single leg stance RT with RT toe taps 3x10   One set holding onto rail, then no hands   Heel Raises 20 reps   Other Standing Ankle Exercises weight shifting on mini tramp 1 min 3 ways  Added marching on mini tramp 1 min     Ankle Exercises: Stretches   Slant Board Stretch 3 reps;30 seconds   Other Stretch Closed chain DF 20x on stairs     Ankle Exercises: Supine   Other Supine Ankle Exercises Towel stretch in semireclined with sheet 3x 20sec                   PT Short Term Goals - 12/16/15 0950      PT SHORT TERM GOAL #1   Title independent with initial HEP   Time 4   Period Weeks   Status Achieved     PT SHORT TERM GOAL #2   Title ability to walk with a cane using the CAM boot with step through step pattern due to increased weightbear on the left   Time 4   Period Weeks   Status Achieved     PT SHORT TERM GOAL #3   Title improve left ankle AROM >/= 5 degrees so she is able to ambulate with less pain.   Time 4   Period Weeks   Status Achieved     PT SHORT TERM GOAL #4   Title swelling in left ankle decreases >/= 3 cm due to decreased edema   Time 4   Period Weeks   Status Achieved           PT Long Term Goals - 12/23/15 1511  PT LONG TERM GOAL #2   Title ability to ambulate without an assistive device for 1000 feet so she is able to walk in the community with full weightbear on left   Time 8   Period Weeks   Status Achieved     PT LONG TERM GOAL #3   Title left ankle AROM without normal limits so she is able to go up and down stairs with step over step pattern using  1 railing   Time 8   Period Weeks   Status Partially Met  Can do ascending, difficulty descending; stairs at home are very tall.               Plan - 12/23/15 1452    Clinical Impression Statement Pt has really ramped up her efforts at home to stretching and concentrating on her walking. At the end of the sesison ankle was translating well during her walking and was not hurting. She was encouraged to do her towel stretch at home more often.    Rehab Potential Excellent   Clinical Impairments Affecting Rehab Potential none   PT Frequency 1x / week   PT Duration 4 weeks   PT Treatment/Interventions Cryotherapy;Electrical Stimulation;Functional mobility training;Stair training;Gait training;Ultrasound;Moist Heat;Therapeutic activities;Therapeutic exercise;Balance training;Neuromuscular re-education;Patient/family  education;Passive range of motion;Manual techniques;Dry needling;Vasopneumatic Device   PT Next Visit Plan increase ROM and strength; work on gait and stairs   Consulted and Agree with Plan of Care Patient      Patient will benefit from skilled therapeutic intervention in order to improve the following deficits and impairments:  Abnormal gait, Decreased range of motion, Difficulty walking, Increased fascial restricitons, Decreased endurance, Pain, Decreased activity tolerance, Decreased balance, Hypomobility, Impaired flexibility, Decreased strength, Decreased mobility, Increased edema  Visit Diagnosis: Other abnormalities of gait and mobility  Stiffness of left ankle, not elsewhere classified  Muscle weakness (generalized)     Problem List Patient Active Problem List   Diagnosis Date Noted  . Elevated BP 07/27/2014  . Bunion of left foot 07/26/2014  . Abnormality of gait 07/26/2014  . Cough 11/10/2013  . Post-nasal drainage 11/10/2013  . Abnormal urine odor 11/10/2013  . Visit for preventive health examination 07/28/2013  . Right anterior knee pain 07/22/2012  . Medicare annual wellness visit, subsequent 07/25/2011  . ANXIETY, SITUATIONAL 09/25/2009  . ARTHRITIS 09/25/2009  . Vitamin D deficiency 09/27/2008  . Hyperlipidemia 09/17/2008  . Elevated blood pressure reading without diagnosis of hypertension 09/17/2008  . COLONIC POLYPS, HX OF 09/17/2008  . PERSONAL HISTORY DISEASES SKIN&SUBCUT TISSUE 09/17/2008    Marlena Barbato, PTA 12/23/2015, 3:28 PM  [facility] [address], [place], [place], [address] Phone: [phone]   Fax:  [phone]  Name: Pamela Alvarez MRN: [id number] Date of Birth: [birth year]

## 2015-12-30 ENCOUNTER — Encounter: Payer: Self-pay | Admitting: Physical Therapy

## 2015-12-30 ENCOUNTER — Ambulatory Visit: Payer: Medicare Other | Admitting: Physical Therapy

## 2015-12-30 DIAGNOSIS — M25672 Stiffness of left ankle, not elsewhere classified: Secondary | ICD-10-CM

## 2015-12-30 DIAGNOSIS — M6281 Muscle weakness (generalized): Secondary | ICD-10-CM

## 2015-12-30 DIAGNOSIS — R2689 Other abnormalities of gait and mobility: Secondary | ICD-10-CM

## 2015-12-30 NOTE — Therapy (Signed)
[facility] [address] [place], [place], [address] Phone: [phone]   Fax:  [phone]  Physical Therapy Treatment  Patient Details  Name: Pamela Alvarez MRN: [id number] Date of Birth: Nov 15, 1940 Referring Provider: Dr. Marchia Bond  Encounter Date: 12/30/2015      PT End of Session - 12/30/15 1148    Visit Number 25   Number of Visits 30   Date for PT Re-Evaluation 01/14/16   Authorization Type KX now   PT Start Time 0177   PT Stop Time 1225   PT Time Calculation (min) 40 min   Activity Tolerance Patient tolerated treatment well   Behavior During Therapy [facility] for tasks assessed/performed      Past Medical History:  Diagnosis Date  . Cyst 07-2008   Cyst on back x2 that were drained  . H/O echocardiogram 02-2000  . Hx of colonic polyps   . Hyperlipidemia     Past Surgical History:  Procedure Laterality Date  . POLYPECTOMY     Colon  . TONSILLECTOMY    . TUBAL LIGATION      There were no vitals filed for this visit.      Subjective Assessment - 12/30/15 1145    Subjective [place]  I wore compression stockings and that helped.    Limitations Walking   Patient Stated Goals Get strength back , not walk with a walker, return to driving   Currently in Pain? No/denies            [facility] PT Assessment - 12/30/15 0001      Strength   Left Ankle Dorsiflexion 5/5   Left Ankle Inversion 5/5   Left Ankle Eversion 4/5                     [facility] Adult PT Treatment/Exercise - 12/30/15 0001      Knee/Hip Exercises: Stretches   Gastroc Stretch 2 reps;30 seconds  both   Gastroc Stretch Limitations on step     Knee/Hip Exercises: Seated   Sit to [employer] 10 reps;without UE support  tactile cues to place increased weight on left foot     Ankle Exercises: Standing   SLS Single leg stance RT with RT toe taps 3x10   One set holding onto rail, then no hands   Heel Raises 20 reps   Balance Beam tandem stance on flat floor 30 sec with one hand on rail 3 times both ways   Other Standing Ankle Exercises stand on foam two fee eyes closed 30 sec x3; one foot 5 sec 2 time each   Other Standing Ankle Exercises weight shifting on mini tramp 1 min 3 ways  Added marching on mini tramp 1 min     Ankle Exercises: Stretches   Gastroc Stretch 2 reps;30 seconds   Slant Board Stretch 3 reps;30 seconds   Other Stretch Closed chain DF 20x on stairs     Ankle Exercises: Supine   T-Band ankle eversion and plantarflexion 30x green band in sitting;    Other Supine Ankle Exercises Towel stretch in semireclined with sheet 3x 20sec     Ankle Exercises: Aerobic   Stationary Bike --  did not want to so today                PT Education - 12/30/15 1221    Education provided No          PT  Short Term Goals - 12/16/15 0950      PT SHORT TERM GOAL #1   Title independent with initial HEP   Time 4   Period Weeks   Status Achieved     PT SHORT TERM GOAL #2   Title ability to walk with a cane using the CAM boot with step through step pattern due to increased weightbear on the left   Time 4   Period Weeks   Status Achieved     PT SHORT TERM GOAL #3   Title improve left ankle AROM >/= 5 degrees so she is able to ambulate with less pain.   Time 4   Period Weeks   Status Achieved     PT SHORT TERM GOAL #4   Title swelling in left ankle decreases >/= 3 cm due to decreased edema   Time 4   Period Weeks   Status Achieved           PT Long Term Goals - 12/30/15 1148      PT LONG TERM GOAL #1   Title independent with HEP   Time 8   Period Weeks   Status On-going  still learning     PT LONG TERM GOAL #2   Title ability to ambulate without an assistive device for 1000 feet so she is able to walk in the community with full weightbear on left   Time 8   Period Weeks   Status Achieved     PT LONG TERM GOAL #3   Title left ankle AROM  without normal limits so she is able to go up and down stairs with step over step pattern using  1 railing   Time 8   Period Weeks   Status Partially Met     PT LONG TERM GOAL #4   Title left ankle strength >/= 4/5 so she is able to return to pilates exercise class   Time 8   Period Weeks   Status On-going     PT LONG TERM GOAL #5   Title FOTO score is </= 48% limitation   Time 8   Period Weeks   Status On-going               Plan - 12/30/15 1157    Clinical Impression Statement Patient is walking with improved heel toe gait.  Patient walks slowly.  Patient has increased strength for left ankle inversion and dorsiflexion. Patient is able to do weight shift on the trampoline without holding on. Patient was [place] but her knees were sore from the exericse. Patient is able to exercise without pain. Patient has tightness in left ankle. Patient needs to hold onto railing with toe touch on step. Patient will benefit from skilled therapy to improve gait and ROM of left ankle.    Rehab Potential Excellent   Clinical Impairments Affecting Rehab Potential none   PT Frequency 1x / week   PT Duration 4 weeks   PT Treatment/Interventions Cryotherapy;Electrical Stimulation;Functional mobility training;Stair training;Gait training;Ultrasound;Moist Heat;Therapeutic activities;Therapeutic exercise;Balance training;Neuromuscular re-education;Patient/family education;Passive range of motion;Manual techniques;Dry needling;Vasopneumatic Device   PT Next Visit Plan increase ROM and strength; work on gait and stairs   PT Home Exercise Plan progress as needed   Consulted and Agree with Plan of Care Patient      Patient will benefit from skilled therapeutic intervention in order to improve the following deficits and impairments:  Abnormal gait, Decreased range of motion, Difficulty walking,  Increased fascial restricitons, Decreased endurance, Pain, Decreased activity tolerance,  Decreased balance, Hypomobility, Impaired flexibility, Decreased strength, Decreased mobility, Increased edema  Visit Diagnosis: Other abnormalities of gait and mobility  Stiffness of left ankle, not elsewhere classified  Muscle weakness (generalized)     Problem List Patient Active Problem List   Diagnosis Date Noted  . Elevated BP 07/27/2014  . Bunion of left foot 07/26/2014  . Abnormality of gait 07/26/2014  . Cough 11/10/2013  . Post-nasal drainage 11/10/2013  . Abnormal urine odor 11/10/2013  . Visit for preventive health examination 07/28/2013  . Right anterior knee pain 07/22/2012  . Medicare annual wellness visit, subsequent 07/25/2011  . ANXIETY, SITUATIONAL 09/25/2009  . ARTHRITIS 09/25/2009  . Vitamin D deficiency 09/27/2008  . Hyperlipidemia 09/17/2008  . Elevated blood pressure reading without diagnosis of hypertension 09/17/2008  . COLONIC POLYPS, HX OF 09/17/2008  . PERSONAL HISTORY DISEASES SKIN&SUBCUT TISSUE 09/17/2008    Earlie Counts, PT 12/30/15 12:24 PM   [facility] [address] [place], [place], [address] Phone: [phone]   Fax:  [phone]  Name: Aylla Huffine MRN: [id number] Date of Birth: December 10, 1940

## 2016-01-06 ENCOUNTER — Encounter: Payer: Self-pay | Admitting: Physical Therapy

## 2016-01-06 ENCOUNTER — Ambulatory Visit: Payer: Medicare Other | Admitting: Physical Therapy

## 2016-01-06 DIAGNOSIS — R2689 Other abnormalities of gait and mobility: Secondary | ICD-10-CM

## 2016-01-06 DIAGNOSIS — M6281 Muscle weakness (generalized): Secondary | ICD-10-CM

## 2016-01-06 DIAGNOSIS — R600 Localized edema: Secondary | ICD-10-CM

## 2016-01-06 DIAGNOSIS — M25672 Stiffness of left ankle, not elsewhere classified: Secondary | ICD-10-CM

## 2016-01-06 NOTE — Therapy (Signed)
[facility] [address] [place], [place], [address] Phone: [phone]   Fax:  [phone]  Physical Therapy Treatment  Patient Details  Name: Pamela Alvarez MRN: [id number] Date of Birth: 1940/09/22 Referring Provider: Dr. Marchia Bond  Encounter Date: 01/06/2016      PT End of Session - 01/06/16 1233    Visit Number 26   Number of Visits 30   Date for PT Re-Evaluation 01/14/16   Authorization Type KX now   PT Start Time 1230   PT Stop Time 1310   PT Time Calculation (min) 40 min   Activity Tolerance Patient tolerated treatment well   Behavior During Therapy [facility] for tasks assessed/performed      Past Medical History:  Diagnosis Date  . Cyst 07-2008   Cyst on back x2 that were drained  . H/O echocardiogram 02-2000  . Hx of colonic polyps   . Hyperlipidemia     Past Surgical History:  Procedure Laterality Date  . POLYPECTOMY     Colon  . TONSILLECTOMY    . TUBAL LIGATION      There were no vitals filed for this visit.      Subjective Assessment - 01/06/16 1232    Subjective I see the MD this week.    Currently in Pain? No/denies   Multiple Pain Sites No                         [facility] Adult PT Treatment/Exercise - 01/06/16 0001      Ambulation/Gait   Gait Comments Worked on carrying items around the gym while she works on her heel-toe pattern  and increasing her step length.      Knee/Hip Exercises: Standing   Hip Abduction Stengthening;Both;2 sets;10 reps;Knee straight   Rocker Board --  Alternating AROM f/b static stretch 30sec x3   Rebounder 1 min 3 ways wt shift     Knee/Hip Exercises: Seated   Sit to Sand 2 sets;10 reps;without UE support     Ankle Exercises: Supine   T-Band 4 ways red 2x10 each     Ankle Exercises: Standing   SLS Toe taps alternating 2x10 no UE supports   Heel Raises --  25x                  PT Short Term Goals - 12/16/15 0950       PT SHORT TERM GOAL #1   Title independent with initial HEP   Time 4   Period Weeks   Status Achieved     PT SHORT TERM GOAL #2   Title ability to walk with a cane using the CAM boot with step through step pattern due to increased weightbear on the left   Time 4   Period Weeks   Status Achieved     PT SHORT TERM GOAL #3   Title improve left ankle AROM >/= 5 degrees so she is able to ambulate with less pain.   Time 4   Period Weeks   Status Achieved     PT SHORT TERM GOAL #4   Title swelling in left ankle decreases >/= 3 cm due to decreased edema   Time 4   Period Weeks   Status Achieved           PT Long Term Goals - 12/30/15 1148      PT LONG TERM GOAL #1   Title independent with HEP  Time 8   Period Weeks   Status On-going  still learning     PT LONG TERM GOAL #2   Title ability to ambulate without an assistive device for 1000 feet so she is able to walk in the community with full weightbear on left   Time 8   Period Weeks   Status Achieved     PT LONG TERM GOAL #3   Title left ankle AROM without normal limits so she is able to go up and down stairs with step over step pattern using  1 railing   Time 8   Period Weeks   Status Partially Met     PT LONG TERM GOAL #4   Title left ankle strength >/= 4/5 so she is able to return to pilates exercise class   Time 8   Period Weeks   Status On-going     PT LONG TERM GOAL #5   Title FOTO score is </= 48% limitation   Time 8   Period Weeks   Status On-going               Plan - 01/06/16 1233    Clinical Impression Statement Pt reports today she continues to go step to step on her stairs at home. this is mainly due to the height of their stairs being higher and they hurt her knees. The family is currently looking into an elevator being installed into their home. As pt walks mre and is verbally reminded, she improves in her ability to heel strike and continue through her foot, but absolutely needs  reminding. Still questionable how much home exercises pt is doing. Shewas encouraaged to stretch the ankle & calf muscles often, throughout the day vs 1x.    Rehab Potential Excellent   Clinical Impairments Affecting Rehab Potential none   PT Frequency 1x / week   PT Duration 4 weeks   PT Treatment/Interventions Cryotherapy;Electrical Stimulation;Functional mobility training;Stair training;Gait training;Ultrasound;Moist Heat;Therapeutic activities;Therapeutic exercise;Balance training;Neuromuscular re-education;Patient/family education;Passive range of motion;Manual techniques;Dry needling;Vasopneumatic Device   PT Next Visit Plan D/C next visit.   Consulted and Agree with Plan of Care --      Patient will benefit from skilled therapeutic intervention in order to improve the following deficits and impairments:  Abnormal gait, Decreased range of motion, Difficulty walking, Increased fascial restricitons, Decreased endurance, Pain, Decreased activity tolerance, Decreased balance, Hypomobility, Impaired flexibility, Decreased strength, Decreased mobility, Increased edema  Visit Diagnosis: Stiffness of left ankle, not elsewhere classified  Muscle weakness (generalized)  Other abnormalities of gait and mobility  Localized edema     Problem List Patient Active Problem List   Diagnosis Date Noted  . Elevated BP 07/27/2014  . Bunion of left foot 07/26/2014  . Abnormality of gait 07/26/2014  . Cough 11/10/2013  . Post-nasal drainage 11/10/2013  . Abnormal urine odor 11/10/2013  . Visit for preventive health examination 07/28/2013  . Right anterior knee pain 07/22/2012  . Medicare annual wellness visit, subsequent 07/25/2011  . ANXIETY, SITUATIONAL 09/25/2009  . ARTHRITIS 09/25/2009  . Vitamin D deficiency 09/27/2008  . Hyperlipidemia 09/17/2008  . Elevated blood pressure reading without diagnosis of hypertension 09/17/2008  . COLONIC POLYPS, HX OF 09/17/2008  . PERSONAL HISTORY  DISEASES SKIN&SUBCUT TISSUE 09/17/2008    Amaiyah Nordhoff, PTA 01/06/2016, 1:11 PM  [facility] [address] [place], [place], [address] Phone: [phone]   Fax:  [phone]  Name: Amran Malter MRN: [id number] Date of Birth: 10-23-1940

## 2016-01-13 ENCOUNTER — Ambulatory Visit: Payer: Medicare Other | Attending: Orthopedic Surgery | Admitting: Physical Therapy

## 2016-01-13 ENCOUNTER — Encounter: Payer: Self-pay | Admitting: Physical Therapy

## 2016-01-13 DIAGNOSIS — R2689 Other abnormalities of gait and mobility: Secondary | ICD-10-CM | POA: Insufficient documentation

## 2016-01-13 DIAGNOSIS — M25672 Stiffness of left ankle, not elsewhere classified: Secondary | ICD-10-CM | POA: Diagnosis present

## 2016-01-13 DIAGNOSIS — M6281 Muscle weakness (generalized): Secondary | ICD-10-CM | POA: Diagnosis present

## 2016-01-13 NOTE — Therapy (Signed)
[facility] [address], [place], [place], [address] Phone: [phone]   Fax:  [phone]  Physical Therapy Treatment  Patient Details  Name: Pamela Alvarez MRN: [id number] Date of Birth: 1940-11-29 Referring Provider: Dr. Marchia Bond  Encounter Date: 01/13/2016      PT End of Session - 01/13/16 1154    Visit Number 27   Number of Visits 30   Date for PT Re-Evaluation 01/14/16   Authorization Type KX now   PT Start Time 1154   PT Stop Time 1232   PT Time Calculation (min) 38 min   Activity Tolerance Patient tolerated treatment well   Behavior During Therapy [facility] for tasks assessed/performed      Past Medical History:  Diagnosis Date  . Cyst 07-2008   Cyst on back x2 that were drained  . H/O echocardiogram 02-2000  . Hx of colonic polyps   . Hyperlipidemia     Past Surgical History:  Procedure Laterality Date  . POLYPECTOMY     Colon  . TONSILLECTOMY    . TUBAL LIGATION      There were no vitals filed for this visit.      Subjective Assessment - 01/13/16 1158    Subjective I feel good.  The doctor released me and discharged me.    Patient Stated Goals Get strength back , not walk with a walker, return to driving   Currently in Pain? No/denies            [facility] PT Assessment - 01/13/16 0001      Assessment   Medical Diagnosis S/P distal fibula fracture   Referring Provider Dr. Marchia Bond   Onset Date/Surgical Date 09/11/15   Prior Therapy None     Precautions   Precautions None     Balance Screen   Has the patient fallen in the past 6 months Yes   How many times? 1  trying to get in a chair   Has the patient had a decrease in activity level because of a fear of falling?  No   Is the patient reluctant to leave their home because of a fear of falling?  No     Home Ecologist residence     Prior Function   Level of Independence Independent   Vocation Retired   Leisure Writer   Overall Cognitive Status Within Functional Limits for tasks assessed     Observation/Other Assessments   Focus on Therapeutic Outcomes (FOTO)  45% limitation     AROM   Left Ankle Dorsiflexion 12   Left Ankle Plantar Flexion 45   Left Ankle Inversion 10   Left Ankle Eversion 15     PROM   Left Ankle Dorsiflexion 20   Left Ankle Plantar Flexion 45   Left Ankle Inversion 15   Left Ankle Eversion 18     Strength   Left Knee Flexion 5/5   Left Knee Extension 4+/5   Left Ankle Dorsiflexion 5/5   Left Ankle Plantar Flexion 4/5   Left Ankle Inversion 5/5   Left Ankle Eversion 4+/5                     [facility] Adult PT Treatment/Exercise - 01/13/16 0001      Knee/Hip Exercises: Standing   Rebounder 1 min 3 ways wt shift  verbal cues for full weight shift   Other Standing Knee  Exercises up and down streps 5 times with railing, step to step coming down and strep to step with coming down.      Knee/Hip Exercises: Seated   Sit to Sand 2 sets;10 reps;without UE support     Ankle Exercises: Standing   SLS Toe taps alternating 2x10 no UE supports   Heel Raises 1 second;20 reps  on steps   Other Standing Ankle Exercises weight shifting on mini tramp 1 min 3 ways  Added marching on mini tramp 1 min                PT Education - 01/13/16 1214    Education provided Yes   Education Details ankle ROM and strengthening   Person(s) Educated Patient   Methods Explanation;Demonstration;Verbal cues;Handout   Comprehension Returned demonstration;Verbalized understanding          PT Short Term Goals - 12/16/15 0950      PT SHORT TERM GOAL #1   Title independent with initial HEP   Time 4   Period Weeks   Status Achieved     PT SHORT TERM GOAL #2   Title ability to walk with a cane using the CAM boot with step through step pattern due to increased weightbear on the left   Time 4   Period Weeks   Status  Achieved     PT SHORT TERM GOAL #3   Title improve left ankle AROM >/= 5 degrees so she is able to ambulate with less pain.   Time 4   Period Weeks   Status Achieved     PT SHORT TERM GOAL #4   Title swelling in left ankle decreases >/= 3 cm due to decreased edema   Time 4   Period Weeks   Status Achieved           PT Long Term Goals - 01/13/16 1232      PT LONG TERM GOAL #1   Title independent with HEP   Time 8   Period Weeks   Status Achieved     PT LONG TERM GOAL #2   Title ability to ambulate without an assistive device for 1000 feet so she is able to walk in the community with full weightbear on left   Time 8   Period Weeks   Status Achieved     PT LONG TERM GOAL #3   Title left ankle AROM without normal limits so she is able to go up and down stairs with step over step pattern using  1 railing   Time 8   Period Weeks   Status Partially Met     PT LONG TERM GOAL #4   Title left ankle strength >/= 4/5 so she is able to return to pilates exercise class   Time 8   Period Weeks   Status Achieved     PT LONG TERM GOAL #5   Title FOTO score is </= 48% limitation   Time 8   Period Weeks   Status Achieved               Plan - 01/13/16 1155    Clinical Impression Statement Patient has increased strength of left ankle with increased ROM.  Patient holds onto railings with step to step pattern. Patient also has a fear with the steps due to her accident ocurring on the steps. Patient has not met LTG# 3 due to stairs.  Patient is able to so the curbs. Patient understands her HEP.   Patient is not having pain in left ankle. Patient is ready for discharge.    Rehab Potential Excellent   Clinical Impairments Affecting Rehab Potential none   PT Treatment/Interventions Cryotherapy;Electrical Stimulation;Functional mobility training;Stair training;Gait training;Ultrasound;Moist Heat;Therapeutic activities;Therapeutic exercise;Balance training;Neuromuscular  re-education;Patient/family education;Passive range of motion;Manual techniques;Dry needling;Vasopneumatic Device   PT Next Visit Plan Discharge this visit   PT Home Exercise Plan Current HEP   Consulted and Agree with Plan of Care Patient      Patient will benefit from skilled therapeutic intervention in order to improve the following deficits and impairments:  Abnormal gait, Decreased range of motion, Difficulty walking, Increased fascial restricitons, Decreased endurance, Pain, Decreased activity tolerance, Decreased balance, Hypomobility, Impaired flexibility, Decreased strength, Decreased mobility, Increased edema  Visit Diagnosis: Stiffness of left ankle, not elsewhere classified  Muscle weakness (generalized)  Other abnormalities of gait and mobility       G-Codes - 01/13/16 1156    Functional Assessment Tool Used FOTO: 45% limitation   Functional Limitation Mobility: Walking and moving around   Mobility: Walking and Moving Around Goal Status (G8979) At least 40 percent but less than 60 percent impaired, limited or restricted   Mobility: Walking and Moving Around Discharge Status (G8980) At least 40 percent but less than 60 percent impaired, limited or restricted      Problem List Patient Active Problem List   Diagnosis Date Noted  . Elevated BP 07/27/2014  . Bunion of left foot 07/26/2014  . Abnormality of gait 07/26/2014  . Cough 11/10/2013  . Post-nasal drainage 11/10/2013  . Abnormal urine odor 11/10/2013  . Visit for preventive health examination 07/28/2013  . Right anterior knee pain 07/22/2012  . Medicare annual wellness visit, subsequent 07/25/2011  . ANXIETY, SITUATIONAL 09/25/2009  . ARTHRITIS 09/25/2009  . Vitamin D deficiency 09/27/2008  . Hyperlipidemia 09/17/2008  . Elevated blood pressure reading without diagnosis of hypertension 09/17/2008  . COLONIC POLYPS, HX OF 09/17/2008  . PERSONAL HISTORY DISEASES SKIN&SUBCUT TISSUE 09/17/2008    Cheryl  Gray, PT 01/13/16 12:35 PM   [facility] [address], [address] [place], [place], [address] Phone: [phone]   Fax:  [phone]  Name: Pamela Alvarez MRN: [id number] Date of Birth: 12/18/1940  PHYSICAL THERAPY DISCHARGE SUMMARY  Visits from Start of Care:27  Current functional level related to goals / functional outcomes: See above   Remaining deficits: See above. Still goes step to step going down steps due to fear of steps.    Education / Equipment: HEP Plan: Patient agrees to discharge.  Patient goals were met. Patient is being discharged due to meeting the stated rehab goals. Thank you for the referral. Cheryl Gray, PT 01/13/16 12:36 PM   ?????       

## 2016-01-13 NOTE — Patient Instructions (Addendum)
Ankle Alphabet    Using left ankle and foot only, trace the letters of the alphabet. Perform A to Z. Repeat ___1_ times per set. Do __1__ sets per session. Do _1___ sessions per day.  http://orth.exer.us/16   Copyright  VHI. All rights reserved.  Gastroc Stretch    Stand with right foot back, leg straight, forward leg bent. Keeping heel on floor, turned slightly out, lean into wall until stretch is felt in calf. Hold _30___ seconds. Repeat __3__ times per set. Do _1___ sets per session. Do _1___ sessions per day.  http://orth.exer.us/26   Copyright  VHI. All rights reserved.  Plantar Flexion: Resisted    Anchor behind, tubing around left foot, press down. Repeat __30__ times per set. Do __1__ sets per session. Do __1__ sessions per day.  http://orth.exer.us/10   Copyright  VHI. All rights reserved.  Eversion: Resisted    With right foot in tubing loop, hold tubing around other foot to resist and turn foot out. Repeat _30___ times per set. Do _1___ sets per session. Do _1___ sessions per day.  http://orth.exer.us/14   Copyright  VHI. All rights reserved.  Inversion: Resisted    Cross legs with right leg underneath, foot in tubing loop. Hold tubing around other foot to resist and turn foot in. Repeat __30__ times per set. Do _1___ sets per session. Do _1___ sessions per day.  http://orth.exer.us/12   Copyright  VHI. All rights reserved.  [facility] [address], [address] [place], [place] [address] Phone # [phone] Fax [phone]

## 2016-02-20 ENCOUNTER — Telehealth: Payer: Self-pay | Admitting: Internal Medicine

## 2016-02-20 NOTE — Telephone Encounter (Signed)
Spoke to the pt.  She was referred to Dr. Shelba FlakeLandau's office for ankle fracture.  Advised that she call his office for further instructions since Dr. [name] is out of the office..  She may need to be seen again. Pt agreed and will call back if needed.

## 2016-02-20 NOTE — Telephone Encounter (Signed)
Pt broke her left ankle this past summer and still having some pain. Would like to know if dr prefers her to take tylenol of advil for the pain.

## 2016-03-10 NOTE — Progress Notes (Signed)
Pre visit review using our clinic review tool, if applicable. No additional management support is needed unless otherwise documented below in the visit note.  Chief Complaint  Patient presents with  . Follow-up    HPI: Pamela Alvarez 76 y.o.  Fu dizziness falling    f  bp  And dexa  Over all doing better Has a list :  Taking advil every night  fopr now. Ask is should take  Not hurting   Does she have a flu?coughinmg runny nose and taking echinacea and zycam   .  No nfever  Cp sob sig pain husband with some cong also  Now up and around without support  Doing pilates   Inc vv noted  And swelling still in LLE  No ulcers  ? What to do ?  Asks for ot referral  Left hand sx numbness tip of thumb making hard to manuipulate. No injury now.  BP readings  Recorded at  Home 119/77 133/80  Readings  ver last 2 weeks in range .   Stopped th losartan  In past   ?  If bp not high enough?  lipitor stopped cause got scared but ready to restart     hasnt gotten dexa yet.   ROS: See pertinent positives and negatives per HPI.  Past Medical History:  Diagnosis Date  . Cyst 07-2008   Cyst on back x2 that were drained  . H/O echocardiogram 02-2000  . Hx of colonic polyps   . Hyperlipidemia     Family History  Problem Relation Age of Onset  . Hypertension Mother   . Parkinsonism Mother   . Angina Father   . Heart disease Father   . Colon cancer Father   . Lung cancer Father   . Skin cancer Father   . Cancer Father     Heart  . Breast cancer Sister     over 26  . Diabetes Sister   . Heart disease Brother 53    triple bypass surgery  . Macular degeneration Sister   . Diabetes Sister     prediabetic  . Hearing loss Sister     hearing problems  . Vasculitis Sister     Social History   Social History  . Marital status: Married    Spouse name: N/A  . Number of children: N/A  . Years of education: N/A   Occupational History  . retired     Social research officer, government [name]   Social  History Main Topics  . Smoking status: Never Smoker  . Smokeless tobacco: Never Used  . Alcohol use 4.2 oz/week    7 Glasses of wine per week     Comment: one a day  . Drug use:   . Sexual activity: Not Asked   Other Topics Concern  . None   Social History Narrative   Regular exercise-yes   Moved back to [place] from [place]   Hh of 2 cat   G3 P2    Exercises walking regu;arly    Retired  Social research officer, government          Outpatient Medications Prior to Visit  Medication Sig Dispense Refill  . atorvastatin (LIPITOR) 10 MG tablet Take 1 tablet (10 mg total) by mouth daily. 90 tablet 3  . losartan (COZAAR) 25 MG tablet TAKE 1 TABLET (25 MG TOTAL) BY MOUTH DAILY. (Patient not taking: Reported on 03/11/2016) 90 tablet 0   No facility-administered medications prior to visit.  EXAM:  BP (!) 180/86 (BP Location: Right Arm, Patient Position: Sitting, Cuff Size: Normal)   Temp 97.6 F (36.4 C) (Oral)   Wt 146 lb (66.2 kg)   BMI 26.70 kg/m   Body mass index is 26.7 kg/m.  GENERAL: vitals reviewed and listed above, alert, oriented, appears well hydrated and in no acute distress mild antalgic gait  HEENT: atraumatic, conjunctiva  clear, no obvious abnormalities on inspection of external nose and ears OP : no lesion edema or exudate  NECK: no obvious masses on inspection palpation  LUNGS: clear to auscultation bilaterally, no wheezes, rales or rhonchi, good air movement CV: HRRR, no clubbing cyanosis superficial vv bilaateral  With 1-2+ edema left le above ankle where injury was  peripheral edema nl cap refill  No ulcers seen  MS:  ankel still edema  Left hand OA changes  Dec sens tip of left thumb  ? Grip ok  Opposition?  PSYCH: pleasant and cooperative, no obvious depression or anxiety Lab Results  Component Value Date   WBC 7.0 12/06/2015   HGB 13.8 12/06/2015   HCT 41.3 12/06/2015   PLT 302.0 12/06/2015   GLUCOSE 89 12/06/2015   CHOL 295 (H) 12/06/2015   TRIG 108.0 12/06/2015    HDL 72.30 12/06/2015   LDLDIRECT 191.8 07/22/2012   LDLCALC 201 (H) 12/06/2015   ALT 22 12/06/2015   AST 24 12/06/2015   NA 142 12/06/2015   K 4.0 12/06/2015   CL 106 12/06/2015   CREATININE 0.91 12/06/2015   BUN 12 12/06/2015   CO2 28 12/06/2015   TSH 2.89 12/06/2015    ASSESSMENT AND PLAN:  Discussed the following assessment and plan:  White coat syndrome without hypertension  Hyperlipidemia, unspecified hyperlipidemia type  Hx of fracture  Varicose veins of lower extremity with edema, unspecified laterality - left more than right  - Plan: Ambulatory referral to Vascular Surgery  Numbness of left thumb - Plan: Ambulatory referral to Occupational Therapy  URI, acute  Hand dysfunction - Plan: Ambulatory referral to Occupational Therapy Vascular   Evaluations  asymmetrical swellnig and vv  BP  wc is high   machine has been vetted at home   Send further readings still may benefit from low dose med  If 140 range  Lipids  restart lipitor and can check at fu  Refer to ot left thumb hand dysfunction Stop the ibuprofen. armica  Ok  Viral resp infection should resolve on its own Over 5 problems addressed today. -Patient advised to return or notify health care team  if symptoms worsen ,persist or new concerns arise.  Patient Instructions  Continue to monitor your blood pressure new or guidelines advise blood pressures closer to 120. In a month send in a week's worth of blood pressure readings. Will be contacted about a vascular consult about the swelling and varicose veins in your legs. We'll do OT referral as you requested because of your left-handed dysfunction and numbness of the thumb. I think your respiratory infection is viral and will run its course. However if you get high fever shortness of breath severe pain let us know. I agree with increasing activity to avoid falling future strength back. Balance is lasting to come back after a lower extremity injury.  I still  suggest you get your bone density for review. Restart the Home Depotlipitor      [name]. Panosh M.D.

## 2016-03-11 ENCOUNTER — Encounter: Payer: Self-pay | Admitting: Internal Medicine

## 2016-03-11 ENCOUNTER — Ambulatory Visit (INDEPENDENT_AMBULATORY_CARE_PROVIDER_SITE_OTHER): Payer: Medicare Other | Admitting: Internal Medicine

## 2016-03-11 VITALS — BP 180/86 | Temp 97.6°F | Wt 146.0 lb

## 2016-03-11 DIAGNOSIS — R208 Other disturbances of skin sensation: Secondary | ICD-10-CM

## 2016-03-11 DIAGNOSIS — J069 Acute upper respiratory infection, unspecified: Secondary | ICD-10-CM

## 2016-03-11 DIAGNOSIS — E785 Hyperlipidemia, unspecified: Secondary | ICD-10-CM

## 2016-03-11 DIAGNOSIS — R030 Elevated blood-pressure reading, without diagnosis of hypertension: Secondary | ICD-10-CM | POA: Diagnosis not present

## 2016-03-11 DIAGNOSIS — I83899 Varicose veins of unspecified lower extremities with other complications: Secondary | ICD-10-CM

## 2016-03-11 DIAGNOSIS — Z8781 Personal history of (healed) traumatic fracture: Secondary | ICD-10-CM

## 2016-03-11 DIAGNOSIS — R200 Anesthesia of skin: Secondary | ICD-10-CM

## 2016-03-11 DIAGNOSIS — I83893 Varicose veins of bilateral lower extremities with other complications: Secondary | ICD-10-CM | POA: Insufficient documentation

## 2016-03-11 DIAGNOSIS — R29898 Other symptoms and signs involving the musculoskeletal system: Secondary | ICD-10-CM | POA: Insufficient documentation

## 2016-03-11 NOTE — Patient Instructions (Addendum)
Continue to monitor your blood pressure new or guidelines advise blood pressures closer to 120. In a month send in a week's worth of blood pressure readings. Will be contacted about a vascular consult about the swelling and varicose veins in your legs. We'll do OT referral as you requested because of your left-handed dysfunction and numbness of the thumb. I think your respiratory infection is viral and will run its course. However if you get high fever shortness of breath severe pain let us know. I agree with increasing activity to avoid falling future strength back. Balance is lasting to come back after a lower extremity injury.  I still suggest you get your bone density for review. Restart the lipitor

## 2016-03-17 ENCOUNTER — Ambulatory Visit: Payer: Medicare Other | Admitting: Occupational Therapy

## 2016-03-18 ENCOUNTER — Other Ambulatory Visit: Payer: Self-pay | Admitting: Internal Medicine

## 2016-03-18 NOTE — Telephone Encounter (Signed)
Sent to the pharmacy by e-scribe for 6 months. Pt due for follow up on 07/09/16 per note on 03/11/16.

## 2016-04-20 ENCOUNTER — Encounter: Payer: Self-pay | Admitting: Vascular Surgery

## 2016-04-28 ENCOUNTER — Ambulatory Visit (INDEPENDENT_AMBULATORY_CARE_PROVIDER_SITE_OTHER): Payer: Medicare Other | Admitting: Vascular Surgery

## 2016-04-28 ENCOUNTER — Encounter: Payer: Self-pay | Admitting: Vascular Surgery

## 2016-04-28 VITALS — BP 165/85 | HR 105 | Temp 96.8°F | Resp 16 | Ht 62.0 in | Wt 149.0 lb

## 2016-04-28 DIAGNOSIS — I83893 Varicose veins of bilateral lower extremities with other complications: Secondary | ICD-10-CM | POA: Diagnosis not present

## 2016-04-28 NOTE — Progress Notes (Signed)
Vitals:   04/28/16 1428  BP: (!) 169/86  Pulse: (!) 105  Resp: 16  Temp: (!) 96.8 F (36 C)  SpO2: 98%  Weight: 149 lb (67.6 kg)  Height: 5\' 2"  (1.575 m)

## 2016-04-28 NOTE — Progress Notes (Signed)
Subjective:     Patient ID: Pamela Alvarez, female   DOB: 06/23/1940, 76 y.o.   MRN: [id number]  HPI 76 year old female was referred Dr. [name] valuation of bluish discoloration of both feet with broken veins. Patient has had bluish discoloration of both feet for many years. She did fall 8 months ago and injure her left leg which was treated with a boot for a partial fracture. The bluish discoloration of both feet has worsened. She does have swelling which developed support the end of the day bilaterally and the ankles. She has multiple broken veins in both feet. She has no history of DVT thrombophlebitis stasis ulcers bleeding.  Past Medical History:  Diagnosis Date  . Cyst 07-2008   Cyst on back x2 that were drained  . H/O echocardiogram 02-2000  . Hx of colonic polyps   . Hyperlipidemia   . Varicose veins of both lower extremities     Social History  Substance Use Topics  . Smoking status: Never Smoker  . Smokeless tobacco: Never Used  . Alcohol use 4.2 oz/week    7 Glasses of wine per week     Comment: one a day    Family History  Problem Relation Age of Onset  . Hypertension Mother   . Parkinsonism Mother   . Angina Father   . Heart disease Father   . Colon cancer Father   . Lung cancer Father   . Skin cancer Father   . Cancer Father     Heart  . Breast cancer Sister     over 3250  . Diabetes Sister   . Heart disease Brother 4255    triple bypass surgery  . Macular degeneration Sister   . Diabetes Sister     prediabetic  . Hearing loss Sister     hearing problems  . Vasculitis Sister     Allergies  Allergen Reactions  . Dust Mite Extract Cough  . Sulfonamide Derivatives      Current Outpatient Prescriptions:  .  atorvastatin (LIPITOR) 10 MG tablet, Take 1 tablet (10 mg total) by mouth daily., Disp: 90 tablet, Rfl: 3 .  losartan (COZAAR) 25 MG tablet, TAKE 1 TABLET EVERY DAY, Disp: 90 tablet, Rfl: 1  Vitals:   04/28/16 1428 04/28/16 1432  BP: (!) 169/86 (!)  165/85  Pulse: (!) 105 (!) 105  Resp: 16   Temp: (!) 96.8 F (36 C)   SpO2: 98%   Weight: 149 lb (67.6 kg)   Height: 5\' 2"  (1.575 m)     Body mass index is 27.25 kg/m.         Review of Systems Chest pain, dyspnea on exertion, PND, orthopnea, hemoptysis, claudication. Does have history of arthritis.    Objective:   Physical Exam BP (!) 165/85 (BP Location: Left Arm, Patient Position: Sitting, Cuff Size: Normal)   Pulse (!) 105   Temp (!) 96.8 F (36 C)   Resp 16   Ht 5\' 2"  (1.575 m)   Wt 149 lb (67.6 kg)   SpO2 98%   BMI 27.25 kg/m     Gen.-alert and oriented x3 in no apparent distress HEENT normal for age Lungs no rhonchi or wheezing Cardiovascular regular rhythm no murmurs carotid pulses 3+ palpable no bruits audible Abdomen soft nontender no palpable masses Musculoskeletal free of  major deformities Skin clear -no rashes Neurologic normal Lower extremities 3+ femoral and dorsalis pedis pulses palpable bilaterally with us edema bilaterally Multiple reticular and spider veins in  both legs particularly in the medial malleolar area with some bluish discoloration of both feet. No bulging varicosities noted.  Today I performed a bedside SonoSite ultrasound exam which reveals gross reflux in the distal great saphenous veins in the thigh area bilaterally with large caliber vein.     Assessment:     #1 bluish discoloration bilaterally with edema and multiple spider and reticular veins due to gross reflux bilateral great saphenous veins #2 status post left leg fracture from fall in July 2017  I discussed the situation with the patient and the fact that if she was interested in 3 months treatment with medical management she may be a candidate for bilateral laser ablation great saphenous vein She was not interested in pursuing this    Plan:     #1 short leg elastic compression stockings 20-30 millimeter gradient daily Elevate foot of bed 2 inches at night Return  to see me on a when necessary basis

## 2016-06-14 ENCOUNTER — Encounter (HOSPITAL_COMMUNITY): Payer: Self-pay | Admitting: Emergency Medicine

## 2016-06-14 ENCOUNTER — Ambulatory Visit (HOSPITAL_COMMUNITY)
Admission: EM | Admit: 2016-06-14 | Discharge: 2016-06-14 | Disposition: A | Discharge status: Home / Self Care | Payer: Medicare Other | Attending: Family Medicine | Admitting: Family Medicine

## 2016-06-14 DIAGNOSIS — S0990XA Unspecified injury of head, initial encounter: Secondary | ICD-10-CM

## 2016-06-14 NOTE — ED Provider Notes (Signed)
[facility]    CSN: [id number] Arrival date & time: 06/14/16  1448     History   Chief Complaint Chief Complaint  Patient presents with  . Head Injury    HPI Pamela Alvarez is a 76 y.o. female.   The patient presented to the [facility] with a complaint of a hematoma on her head secondary to a fall that occurred today. The patient denied any blood thinners of LOC.   Patient does use Aspercreme on her knees to help with the arthritis.  Patient had a fall a year ago and broke her ankle and is only now beginning to get her balance back. She states that she did not injure anything other than her scalp when she fell today about 2 hours prior to arrival. She's had no change in her vision or hearing. Her neck is not sore. She does not have a headache.      Past Medical History:  Diagnosis Date  . Cyst 07-2008   Cyst on back x2 that were drained  . H/O echocardiogram 02-2000  . Hx of colonic polyps   . Hyperlipidemia   . Varicose veins of both lower extremities     Patient Active Problem List   Diagnosis Date Noted  . Hand dysfunction 03/11/2016  . Symptomatic varicose veins, bilateral 03/11/2016  . Numbness of left thumb 03/11/2016  . Elevated BP 07/27/2014  . Bunion of left foot 07/26/2014  . Abnormality of gait 07/26/2014  . Cough 11/10/2013  . Post-nasal drainage 11/10/2013  . Abnormal urine odor 11/10/2013  . Visit for preventive health examination 07/28/2013  . Right anterior knee pain 07/22/2012  . Medicare annual wellness visit, subsequent 07/25/2011  . ANXIETY, SITUATIONAL 09/25/2009  . ARTHRITIS 09/25/2009  . Vitamin D deficiency 09/27/2008  . Hyperlipidemia 09/17/2008  . Elevated blood pressure reading without diagnosis of hypertension 09/17/2008  . COLONIC POLYPS, HX OF 09/17/2008  . PERSONAL HISTORY DISEASES SKIN&SUBCUT TISSUE 09/17/2008    Past Surgical History:  Procedure Laterality Date  . POLYPECTOMY     Colon  . TONSILLECTOMY    . TUBAL  LIGATION      OB History    No data available       Home Medications    Prior to Admission medications   Medication Sig Start Date End Date Taking? Authorizing Provider  atorvastatin (LIPITOR) 10 MG tablet Take 1 tablet (10 mg total) by mouth daily. 12/09/15  Yes [name], MD  losartan (COZAAR) 25 MG tablet TAKE 1 TABLET EVERY DAY 03/18/16  Yes [name], MD    Family History Family History  Problem Relation Age of Onset  . Hypertension Mother   . Parkinsonism Mother   . Angina Father   . Heart disease Father   . Colon cancer Father   . Lung cancer Father   . Skin cancer Father   . Cancer Father     Heart  . Breast cancer Sister     over 64  . Diabetes Sister   . Heart disease Brother 88    triple bypass surgery  . Macular degeneration Sister   . Diabetes Sister     prediabetic  . Hearing loss Sister     hearing problems  . Vasculitis Sister     Social History Social History  Substance Use Topics  . Smoking status: Never Smoker  . Smokeless tobacco: Never Used  . Alcohol use 4.2 oz/week    7 Glasses of wine per  week     Comment: one a day     Allergies   Dust mite extract and Sulfonamide derivatives   Review of Systems Review of Systems  Constitutional: Negative.   HENT: Negative.   Eyes: Negative.   Respiratory: Negative.   Cardiovascular: Negative.   Gastrointestinal: Negative.   Musculoskeletal: Positive for arthralgias.  Neurological: Negative.   All other systems reviewed and are negative.    Physical Exam Triage Vital Signs ED Triage Vitals  Enc Vitals Group     BP 06/14/16 1508 (!) 191/73     Pulse Rate 06/14/16 1508 (!) 101     Resp 06/14/16 1508 18     Temp 06/14/16 1508 97.8 F (36.6 C)     Temp Source 06/14/16 1508 Oral     SpO2 06/14/16 1508 99 %     Weight --      Height --      Head Circumference --      Peak Flow --      Pain Score 06/14/16 1507 3     Pain Loc --      Pain Edu? --      Excl. in GC? --     No data found.   Updated Vital Signs BP (!) 191/73 (BP Location: Right Arm)   Pulse (!) 101   Temp 97.8 F (36.6 C) (Oral)   Resp 18   SpO2 99%    Physical Exam  Constitutional: She is oriented to person, place, and time. She appears well-developed and well-nourished.  HENT:  Right Ear: External ear normal.  Left Ear: External ear normal.  Mouth/Throat: Oropharynx is clear and moist.  Patient has very mild swelling in the parieto-occipital area of her left scalp. There is no skin break and no ecchymosis. There is no battle sign  Eyes: Conjunctivae and EOM are normal. Pupils are equal, round, and reactive to light.  Discs are flat and margins are sharp  Neck: Normal range of motion. Neck supple.  Musculoskeletal: Normal range of motion.  Neurological: She is alert and oriented to person, place, and time. No cranial nerve deficit or sensory deficit. She exhibits normal muscle tone. Coordination normal.   has a slightly wide-based gait which her husband says is normal for her. She has no dysmetria, no tremor, and no ataxia.  Skin: Skin is warm and dry.  Nursing note and vitals reviewed.    UC Treatments / Results  Labs (all labs ordered are listed, but only abnormal results are displayed) Labs Reviewed - No data to display  EKG  EKG Interpretation None       Radiology No results found.  Procedures Procedures (including critical care time)  Medications Ordered in UC Medications - No data to display   Initial Impression / Assessment and Plan / UC Course  I have reviewed the triage vital signs and the nursing notes.  Pertinent labs & imaging results that were available during my care of the patient were reviewed by me and considered in my medical decision making (see chart for details).     Final Clinical Impressions(s) / UC Diagnoses   Final diagnoses:  Injury of head, initial encounter    New Prescriptions Discharge Medication List as of 06/14/2016   4:37 PM    Patient told to return for any increasing headache, ataxia, or other signs of focal neurological defect   Elvina Sidle, MD 06/14/16 1640

## 2016-06-14 NOTE — ED Triage Notes (Signed)
The patient presented to the [facility] with a complaint of a hematoma on her head secondary to a fall that occurred today. The patient denied any blood thinners of LOC.

## 2016-06-14 NOTE — ED Notes (Signed)
Patient given an ice pack. 

## 2016-08-13 ENCOUNTER — Ambulatory Visit: Payer: Medicare Other | Admitting: Sports Medicine

## 2016-09-03 ENCOUNTER — Ambulatory Visit (INDEPENDENT_AMBULATORY_CARE_PROVIDER_SITE_OTHER): Payer: Medicare Other | Admitting: Sports Medicine

## 2016-09-03 ENCOUNTER — Encounter: Payer: Self-pay | Admitting: Sports Medicine

## 2016-09-03 DIAGNOSIS — S82892S Other fracture of left lower leg, sequela: Secondary | ICD-10-CM | POA: Diagnosis not present

## 2016-09-03 DIAGNOSIS — S82892A Other fracture of left lower leg, initial encounter for closed fracture: Secondary | ICD-10-CM | POA: Insufficient documentation

## 2016-09-03 NOTE — Progress Notes (Signed)
CC: Chronic left ankle pain  Patient has hx of frequent falls Some chronic left ankle pain and swelling Had a fall in Sept 2017 Displaced spiral fracture of fibula Some instability of ankle joint Landau recommended surgery but they opted for Tx with cast  This gradually healed and become less painful  However, since last fall persistent swelling in left ankle Moderate pain - takes some aleve Balance is not as good  Has completed PT Now doing Pilates once weekly with Jen Cochrane  ROS Bunion on left foot with some periodic pain vericose veins with easy bruising No sciatica  PE Generally pleasant F in NAD/ husband assists w history BP (!) 162/74   Ht 5\' 2"  (1.575 m)   Wt 145 lb (65.8 kg)   BMI 26.52 kg/m    Left ankle shows irregular callus over fibula 7 cm above lat. Malleolus Moderate swelling Good dorsiflexion and plantarflexion Tight for inversion or eversion No warmth or redness  Walks favoring left ankle/foot Balance is very poor on rt foot even with eyes open Unsteady balance on left foot  Varicosities of both ankles and lower legs  Review of XRays from Oct. 2017 Good fracture healing of oblique fracture of fibula Thickened callus Minor angulation in fibula exists Medial malleolus somewhat irregular Soft tissue swelling

## 2016-09-03 NOTE — Assessment & Plan Note (Signed)
I reassured her that this amount of swelling is not unusual after this fracture  Trial of using ankle compression  HEP to work on balance and ankle motion  RECK prn

## 2016-09-03 NOTE — Patient Instructions (Signed)
Your ankle alignment is good/ 90%  You need to practice balance/ 3 exercises  1 foot stand Lean on table and do heel raises Step ups on a 4 to 8 inch step  Use aleve as needed only for pain  At end of day ice may be helpful  See me as needed  Keep up pilates

## 2016-10-01 ENCOUNTER — Ambulatory Visit: Payer: Medicare Other | Admitting: Podiatry

## 2016-10-08 ENCOUNTER — Encounter: Payer: Self-pay | Admitting: Podiatry

## 2016-10-08 ENCOUNTER — Ambulatory Visit (INDEPENDENT_AMBULATORY_CARE_PROVIDER_SITE_OTHER): Payer: Medicare Other | Admitting: Podiatry

## 2016-10-08 DIAGNOSIS — L600 Ingrowing nail: Secondary | ICD-10-CM

## 2016-10-08 DIAGNOSIS — B351 Tinea unguium: Secondary | ICD-10-CM | POA: Diagnosis not present

## 2016-10-08 DIAGNOSIS — M79675 Pain in left toe(s): Secondary | ICD-10-CM | POA: Diagnosis not present

## 2016-10-08 DIAGNOSIS — M79674 Pain in right toe(s): Secondary | ICD-10-CM

## 2016-10-08 NOTE — Progress Notes (Signed)
   Subjective:    Patient ID: Pamela ApleySandra Kruszka, female    DOB: 07/19/1940, 76 y.o.   MRN: [id number]  HPI  76 year old female presents the also requests her nails trimmed as they are thickened and elongated causing irritation in shoes. She denies any redness or drainage or any swelling on the toenail sites. She has no other issues to her toes. She does have history of a left ankle fracture this treatment orthopedics and since that she's had some swelling but denies any pain or any change.  No other complaints today.  Review of Systems  All other systems reviewed and are negative.      Objective:   Physical Exam General: AAO x3, NAD  Dermatological: Nails are hypertrophic, dystrophic, brittle, discolored, elongated 10. There is mild incurvation along the toenails, bilateral hallux. No surrounding redness or drainage. There is subjective tenderness nails 1-5 bilaterally. No open lesions or pre-ulcerative lesions are identified today.  Vascular: Dorsalis Pedis artery and Posterior Tibial artery pedal pulses are 2/4 bilateral with immedate capillary fill time. There is no pain with calf compression, swelling, warmth, erythema.   Neruologic: Grossly intact via light touch bilateral. Vibratory intact via tuning fork bilateral. Protective threshold with Semmes Wienstein monofilament intact to all pedal sites bilateral.   Musculoskeletal: No gross boney pedal deformities bilateral. No pain, crepitus, or limitation noted with foot and ankle range of motion bilateral. Muscular strength 5/5 in all groups tested bilateral.    Assessment & Plan:  76 year old female with symptomatic onychomycosis -Treatment options discussed including all alternatives, risks, and complications -Etiology of symptoms were discussed -Nails debrided 10 without complications or bleeding. -She states that she has some over-the-counter treatments for nail fungus but she is not been using them. Discussed that she can continue  these treatments daily basis for the next couple months sutures any improvement. -Daily foot inspection -Follow-up in 3 months or sooner if any problems arise. In the meantime, encouraged to call the office with any questions, concerns, change in symptoms.   Ovid CurdMatthew Jahking Lesser, DPM

## 2016-11-13 ENCOUNTER — Telehealth: Payer: Self-pay | Admitting: *Deleted

## 2016-11-13 NOTE — Telephone Encounter (Signed)
Pamela Alvarez's husband [name], had a office visit with Dr Durene CalHunter 11/12/16.  He discussed with Dr Durene CalHunter some concerns he has about Pamela Raulston's possible memory loss.  Pamela [name] has an upcoming appointment with Dr [name] 12/18/16.  FYI

## 2016-11-13 NOTE — Telephone Encounter (Signed)
Noted   Had head injury in apri 2018

## 2016-12-18 ENCOUNTER — Encounter: Payer: Self-pay | Admitting: Internal Medicine

## 2016-12-18 ENCOUNTER — Ambulatory Visit (INDEPENDENT_AMBULATORY_CARE_PROVIDER_SITE_OTHER): Payer: Medicare Other | Admitting: Internal Medicine

## 2016-12-18 VITALS — BP 140/74 | HR 78 | Temp 97.5°F | Ht 62.0 in | Wt 151.0 lb

## 2016-12-18 DIAGNOSIS — E785 Hyperlipidemia, unspecified: Secondary | ICD-10-CM

## 2016-12-18 DIAGNOSIS — Z79899 Other long term (current) drug therapy: Secondary | ICD-10-CM

## 2016-12-18 DIAGNOSIS — E2839 Other primary ovarian failure: Secondary | ICD-10-CM | POA: Diagnosis not present

## 2016-12-18 DIAGNOSIS — I10 Essential (primary) hypertension: Secondary | ICD-10-CM | POA: Diagnosis not present

## 2016-12-18 DIAGNOSIS — Z23 Encounter for immunization: Secondary | ICD-10-CM

## 2016-12-18 DIAGNOSIS — Z0000 Encounter for general adult medical examination without abnormal findings: Secondary | ICD-10-CM

## 2016-12-18 LAB — CBC WITH DIFFERENTIAL/PLATELET
BASOS PCT: 1 % (ref 0.0–3.0)
Basophils Absolute: 0.1 10*3/uL (ref 0.0–0.1)
EOS PCT: 2.8 % (ref 0.0–5.0)
Eosinophils Absolute: 0.2 10*3/uL (ref 0.0–0.7)
HCT: 45.7 % (ref 36.0–46.0)
HEMOGLOBIN: 15.1 g/dL — AB (ref 12.0–15.0)
LYMPHS ABS: 2.2 10*3/uL (ref 0.7–4.0)
Lymphocytes Relative: 28.3 % (ref 12.0–46.0)
MCHC: 32.9 g/dL (ref 30.0–36.0)
MCV: 93.7 fl (ref 78.0–100.0)
MONOS PCT: 7.8 % (ref 3.0–12.0)
Monocytes Absolute: 0.6 10*3/uL (ref 0.1–1.0)
Neutro Abs: 4.6 10*3/uL (ref 1.4–7.7)
Neutrophils Relative %: 60.1 % (ref 43.0–77.0)
Platelets: 282 10*3/uL (ref 150.0–400.0)
RBC: 4.87 Mil/uL (ref 3.87–5.11)
RDW: 14 % (ref 11.5–15.5)
WBC: 7.7 10*3/uL (ref 4.0–10.5)

## 2016-12-18 LAB — BASIC METABOLIC PANEL
BUN: 12 mg/dL (ref 6–23)
CALCIUM: 8.9 mg/dL (ref 8.4–10.5)
CO2: 27 meq/L (ref 19–32)
CREATININE: 0.86 mg/dL (ref 0.40–1.20)
Chloride: 107 mEq/L (ref 96–112)
GFR: 68 mL/min (ref >60.00)
GLUCOSE: 92 mg/dL (ref 70–99)
Potassium: 4.3 mEq/L (ref 3.5–5.1)
Sodium: 142 mEq/L (ref 135–145)

## 2016-12-18 LAB — HEPATIC FUNCTION PANEL
ALBUMIN: 4.2 g/dL (ref 3.5–5.2)
ALT: 15 U/L (ref 0–35)
AST: 21 U/L (ref 0–37)
Alkaline Phosphatase: 53 U/L (ref 39–117)
Bilirubin, Direct: 0.1 mg/dL (ref 0.0–0.3)
TOTAL PROTEIN: 6.9 g/dL (ref 6.0–8.3)
Total Bilirubin: 0.7 mg/dL (ref 0.2–1.2)

## 2016-12-18 LAB — LIPID PANEL
CHOLESTEROL: 257 mg/dL — AB (ref 0–200)
HDL: 75.6 mg/dL (ref >39.00)
LDL Cholesterol: 160 mg/dL — ABNORMAL HIGH (ref 0–99)
NonHDL: 181.12
Total CHOL/HDL Ratio: 3
Triglycerides: 105 mg/dL (ref 0.0–149.0)
VLDL: 21 mg/dL (ref 0.0–40.0)

## 2016-12-18 LAB — TSH: TSH: 3.15 u[IU]/mL (ref 0.35–4.50)

## 2016-12-18 MED ORDER — ZOSTER VAC RECOMB ADJUVANTED 50 MCG/0.5ML IM SUSR: 0.5000 mL | Freq: Once | INTRAMUSCULAR | 1 refills | Status: AC

## 2016-12-18 NOTE — Progress Notes (Signed)
No chief complaint on file.   HPI: Patient  Pamela Alvarez  76 y.o. comes in today for Preventive Health Care visit  Med eval.  LIPIDS  taking atorva 3 x per week.   BP readings : 140 150 but didn't start the medication  Low dose losartan   See message from  Husband  She doesn't think a problem no getting lost she is worried about this memory   Has moved to one story cause of gait fall risk issues  Left le still problematic being very cautious   No dexa done  yet  Health Maintenance  Topic Date Due  . DEXA SCAN  12/15/2004  . COLONOSCOPY  02/11/2016  . TETANUS/TDAP  07/08/2018  . INFLUENZA VACCINE  Completed  . PNA vac Low Risk Adult  Completed   Health Maintenance Review LIFESTYLE:  Exercise:   pilates once a week.  Tobacco/ETS: no Alcohol:   Not sure when out  Sugar beverages: no Sleep: 8 hour  Drug use: no HH of 2 no pets  no  ROS:  GEN/ HEENT: No fever, significant weight changes sweats headaches vision problems hearing changes, CV/ PULM; No chest pain shortness of breath cough, syncope,edema  change in exercise tolerance. GI /GU: No adominal pain, vomiting, change in bowel habits. No blood in the stool. No significant GU symptoms. SKIN/HEME: ,no acute skin rashes suspicious lesions or bleeding. No lymphadenopathy, nodules, masses.  NEURO/ PSYCH:  No neurologic signs such as weakness numbness. No depression anxiety. IMM/ Allergy: No unusual infections.  Allergy .   REST of 12 system review negative except as per HPI   Past Medical History:  Diagnosis Date  . Cyst 07-2008   Cyst on back x2 that were drained  . H/O echocardiogram 02-2000  . Hx of colonic polyps   . Hyperlipidemia   . Varicose veins of both lower extremities     Past Surgical History:  Procedure Laterality Date  . POLYPECTOMY     Colon  . TONSILLECTOMY    . TUBAL LIGATION      Family History  Problem Relation Age of Onset  . Hypertension Mother   . Parkinsonism Mother   . Angina  Father   . Heart disease Father   . Colon cancer Father   . Lung cancer Father   . Skin cancer Father   . Cancer Father        Heart  . Breast cancer Sister        over 65  . Diabetes Sister   . Heart disease Brother 68       triple bypass surgery  . Macular degeneration Sister   . Diabetes Sister        prediabetic  . Hearing loss Sister        hearing problems  . Vasculitis Sister     Social History   Social History  . Marital status: Married    Spouse name: N/A  . Number of children: N/A  . Years of education: N/A   Occupational History  . retired     Teaching laboratory technician [name]   Social History Main Topics  . Smoking status: Never Smoker  . Smokeless tobacco: Never Used  . Alcohol use 4.2 oz/week    7 Glasses of wine per week     Comment: one a day  . Drug use: Yes  . Sexual activity: Not Asked   Other Topics Concern  . None   Social History Narrative  Regular exercise-yes   Moved back to [place] from [place] of 2 cat   G3 P2    Exercises walking regu;arly    Retired  Teaching laboratory technician          Outpatient Medications Prior to Visit  Medication Sig Dispense Refill  . atorvastatin (LIPITOR) 10 MG tablet Take 1 tablet (10 mg total) by mouth daily. 90 tablet 3  . losartan (COZAAR) 25 MG tablet TAKE 1 TABLET EVERY DAY 90 tablet 1   No facility-administered medications prior to visit.      EXAM:  BP 140/74 (BP Location: Left Arm, Patient Position: Sitting, Cuff Size: Normal)   Pulse 78   Temp (!) 97.5 F (36.4 C) (Oral)   Ht 5' 2" (1.575 m)   Wt 151 lb (68.5 kg)   SpO2 97%   BMI 27.62 kg/m   Body mass index is 27.62 kg/m. Wt Readings from Last 3 Encounters:  12/18/16 151 lb (68.5 kg)  09/03/16 145 lb (65.8 kg)  04/28/16 149 lb (67.6 kg)    Physical Exam: Vital signs reviewed HQI:ONGE is a well-developed well-nourished alert cooperative    who appearsr stated age in no acute distress.  Some help to get up on table left ankle  Favored    HEENT: normocephalic atraumatic , Eyes: PERRL EOM's full, conjunctiva clear glasses , Nares: paten,t no deformity discharge or tenderness., Ears: no deformity EAC's clear TMs with normal landmarks. Mouth: clear OP, no lesions, edema.  Moist mucous membranes. Dentition in adequate repair. NECK: supple without masses, thyromegaly or bruits. CHEST/PULM:  Clear to auscultation and percussion breath sounds equal no wheeze , rales or rhonchi. No chest wall deformities or tenderness. Breast: normal by inspection . No dimpling, discharge, masses, tenderness or discharge . CV: PMI is nondisplaced, S1 S2 no gallops, murmurs, rubs. Peripheral pulses are full without delay.No JVD .  ABDOMEN: Bowel sounds normal nontender  No guard or rebound, no hepato splenomegal no CVA tenderness.  Extremtities:  No clubbing cyanosis or edema, no acute joint swelling or redness y vv feet  Mild swelling left more than right  NEURO:  Oriented x3, cranial nerves 3-12 appear to be intact, no obvious focal weakness,gait  Uneven but steady  no abnormal reflexes or asymmetrical  Can spell owrk backowrk id ok   Screening memory no tremor  SKIN: No acute rashes normal turgor, color, no bruising or petechiae. PSYCH: Oriented, good eye contact, , cognition and judgment appear normal.   LN: no cervical axillary inguinal adenopathy  Lab Results  Component Value Date   WBC 7.7 12/18/2016   HGB 15.1 (H) 12/18/2016   HCT 45.7 12/18/2016   PLT 282.0 12/18/2016   GLUCOSE 92 12/18/2016   CHOL 257 (H) 12/18/2016   TRIG 105.0 12/18/2016   HDL 75.60 12/18/2016   LDLDIRECT 191.8 07/22/2012   LDLCALC 160 (H) 12/18/2016   ALT 15 12/18/2016   AST 21 12/18/2016   NA 142 12/18/2016   K 4.3 12/18/2016   CL 107 12/18/2016   CREATININE 0.86 12/18/2016   BUN 12 12/18/2016   CO2 27 12/18/2016   TSH 3.15 12/18/2016    BP Readings from Last 3 Encounters:  12/18/16 140/74  09/03/16 (!) 162/74  06/14/16 (!) 191/73    Lab results  reviewed with patient   ASSESSMENT AND PLAN:  Discussed the following assessment and plan:  Visit for preventive health examination - Plan: Basic metabolic panel, CBC with Differential/Platelet, Hepatic function panel, Lipid panel,  TSH  Hyperlipidemia, unspecified hyperlipidemia type - ldl over 200 - Plan: Basic metabolic panel, CBC with Differential/Platelet, Hepatic function panel, Lipid panel, TSH  Essential hypertension - Plan: Basic metabolic panel, CBC with Differential/Platelet, Hepatic function panel, Lipid panel, TSH  Medication management - Plan: Basic metabolic panel, CBC with Differential/Platelet, Hepatic function panel, Lipid panel, TSH  Estrogen deficiency - Plan: Basic metabolic panel, CBC with Differential/Platelet, Hepatic function panel, Lipid panel, TSH, DG Bone Density  Need for prophylactic vaccination and inoculation against influenza - Plan: Flu vaccine HIGH DOSE PF Disc  memory issues doesn't want a b12 level . Feels memory  Not impaired for adls  Will follow  Begin losartan and fu 3 mos after dexa scan . Readdress memory balance  At that time   She says not more falling but  Will follow up .  Patient Care Team: Liahm Grivas, Standley Brooking, MD as PCP - Windle Guard, MD (Gastroenterology) Sharyne Peach, MD (Ophthalmology) Rolm Bookbinder, MD as Attending Physician (Dermatology) Stefanie Libel, MD as Consulting Physician (Sports Medicine) Marchia Bond, MD as Consulting Physician (Orthopedic Surgery) Patient Instructions  Goal bp is 120 - 130 rang e Begin the low dose  Losartan daily      Get dexa scan  Let you know labs when available.  If memory concerns  Advise control bp lipids do some exercise   consider shingrix vaccine  ROV in 3-4 months in regard to BP  Medication management and  Fu DEXA scan  If husband worried about memory than can come to visit with you .    Preventive Care 43 Years and Older, Female Preventive care refers to lifestyle choices and  visits with your health care provider that can promote health and wellness. What does preventive care include?  A yearly physical exam. This is also called an annual well check.  Dental exams once or twice a year.  Routine eye exams. Ask your health care provider how often you should have your eyes checked.  Personal lifestyle choices, including: ? Daily care of your teeth and gums. ? Regular physical activity. ? Eating a healthy diet. ? Avoiding tobacco and drug use. ? Limiting alcohol use. ? Practicing safe sex. ? Taking low-dose aspirin every day. ? Taking vitamin and mineral supplements as recommended by your health care provider. What happens during an annual well check? The services and screenings done by your health care provider during your annual well check will depend on your age, overall health, lifestyle risk factors, and family history of disease. Counseling Your health care provider may ask you questions about your:  Alcohol use.  Tobacco use.  Drug use.  Emotional well-being.  Home and relationship well-being.  Sexual activity.  Eating habits.  History of falls.  Memory and ability to understand (cognition).  Work and work Statistician.  Reproductive health.  Screening You may have the following tests or measurements:  Height, weight, and BMI.  Blood pressure.  Lipid and cholesterol levels. These may be checked every 5 years, or more frequently if you are over 24 years old.  Skin check.  Lung cancer screening. You may have this screening every year starting at age 32 if you have a 30-pack-year history of smoking and currently smoke or have quit within the past 15 years.  Fecal occult blood test (FOBT) of the stool. You may have this test every year starting at age 64.  Flexible sigmoidoscopy or colonoscopy. You may have a sigmoidoscopy every 5 years or a colonoscopy  every 10 years starting at age 10.  Hepatitis C blood test.  Hepatitis B  blood test.  Sexually transmitted disease (STD) testing.  Diabetes screening. This is done by checking your blood sugar (glucose) after you have not eaten for a while (fasting). You may have this done every 1-3 years.  Bone density scan. This is done to screen for osteoporosis. You may have this done starting at age 78.  Mammogram. This may be done every 1-2 years. Talk to your health care provider about how often you should have regular mammograms.  Talk with your health care provider about your test results, treatment options, and if necessary, the need for more tests. Vaccines Your health care provider may recommend certain vaccines, such as:  Influenza vaccine. This is recommended every year.  Tetanus, diphtheria, and acellular pertussis (Tdap, Td) vaccine. You may need a Td booster every 10 years.  Varicella vaccine. You may need this if you have not been vaccinated.  Zoster vaccine. You may need this after age 52.  Measles, mumps, and rubella (MMR) vaccine. You may need at least one dose of MMR if you were born in 1957 or later. You may also need a second dose.  Pneumococcal 13-valent conjugate (PCV13) vaccine. One dose is recommended after age 31.  Pneumococcal polysaccharide (PPSV23) vaccine. One dose is recommended after age 67.  Meningococcal vaccine. You may need this if you have certain conditions.  Hepatitis A vaccine. You may need this if you have certain conditions or if you travel or work in places where you may be exposed to hepatitis A.  Hepatitis B vaccine. You may need this if you have certain conditions or if you travel or work in places where you may be exposed to hepatitis B.  Haemophilus influenzae type b (Hib) vaccine. You may need this if you have certain conditions.  Talk to your health care provider about which screenings and vaccines you need and how often you need them. This information is not intended to replace advice given to you by your health  care provider. Make sure you discuss any questions you have with your health care provider. Document Released: 03/22/2015 Document Revised: 11/13/2015 Document Reviewed: 12/25/2014 Elsevier Interactive Patient Education  2017 [place] K.  M.D.

## 2016-12-18 NOTE — Patient Instructions (Addendum)
Goal bp is 120 - 130 rang e Begin the low dose  Losartan daily      Get dexa scan  Let you know labs when available.  If memory concerns  Advise control bp lipids do some exercise   consider shingrix vaccine  ROV in 3-4 months in regard to BP  Medication management and  Fu DEXA scan  If husband worried about memory than can come to visit with you .    Preventive Care 77 Years and Older, Female Preventive care refers to lifestyle choices and visits with your health care provider that can promote health and wellness. What does preventive care include?  A yearly physical exam. This is also called an annual well check.  Dental exams once or twice a year.  Routine eye exams. Ask your health care provider how often you should have your eyes checked.  Personal lifestyle choices, including: ? Daily care of your teeth and gums. ? Regular physical activity. ? Eating a healthy diet. ? Avoiding tobacco and drug use. ? Limiting alcohol use. ? Practicing safe sex. ? Taking low-dose aspirin every day. ? Taking vitamin and mineral supplements as recommended by your health care provider. What happens during an annual well check? The services and screenings done by your health care provider during your annual well check will depend on your age, overall health, lifestyle risk factors, and family history of disease. Counseling Your health care provider may ask you questions about your:  Alcohol use.  Tobacco use.  Drug use.  Emotional well-being.  Home and relationship well-being.  Sexual activity.  Eating habits.  History of falls.  Memory and ability to understand (cognition).  Work and work Statistician.  Reproductive health.  Screening You may have the following tests or measurements:  Height, weight, and BMI.  Blood pressure.  Lipid and cholesterol levels. These may be checked every 5 years, or more frequently if you are over 26 years old.  Skin check.  Lung  cancer screening. You may have this screening every year starting at age 25 if you have a 30-pack-year history of smoking and currently smoke or have quit within the past 15 years.  Fecal occult blood test (FOBT) of the stool. You may have this test every year starting at age 77.  Flexible sigmoidoscopy or colonoscopy. You may have a sigmoidoscopy every 5 years or a colonoscopy every 10 years starting at age 60.  Hepatitis C blood test.  Hepatitis B blood test.  Sexually transmitted disease (STD) testing.  Diabetes screening. This is done by checking your blood sugar (glucose) after you have not eaten for a while (fasting). You may have this done every 1-3 years.  Bone density scan. This is done to screen for osteoporosis. You may have this done starting at age 77.  Mammogram. This may be done every 1-2 years. Talk to your health care provider about how often you should have regular mammograms.  Talk with your health care provider about your test results, treatment options, and if necessary, the need for more tests. Vaccines Your health care provider may recommend certain vaccines, such as:  Influenza vaccine. This is recommended every year.  Tetanus, diphtheria, and acellular pertussis (Tdap, Td) vaccine. You may need a Td booster every 10 years.  Varicella vaccine. You may need this if you have not been vaccinated.  Zoster vaccine. You may need this after age 77.  Measles, mumps, and rubella (MMR) vaccine. You may need at least one dose of  MMR if you were born in 1957 or later. You may also need a second dose.  Pneumococcal 13-valent conjugate (PCV13) vaccine. One dose is recommended after age 77.  Pneumococcal polysaccharide (PPSV23) vaccine. One dose is recommended after age 65.  Meningococcal vaccine. You may need this if you have certain conditions.  Hepatitis A vaccine. You may need this if you have certain conditions or if you travel or work in places where you may be  exposed to hepatitis A.  Hepatitis B vaccine. You may need this if you have certain conditions or if you travel or work in places where you may be exposed to hepatitis B.  Haemophilus influenzae type b (Hib) vaccine. You may need this if you have certain conditions.  Talk to your health care provider about which screenings and vaccines you need and how often you need them. This information is not intended to replace advice given to you by your health care provider. Make sure you discuss any questions you have with your health care provider. Document Released: 03/22/2015 Document Revised: 11/13/2015 Document Reviewed: 12/25/2014 Elsevier Interactive Patient Education  2017 [employer].

## 2017-01-06 ENCOUNTER — Other Ambulatory Visit: Payer: Self-pay | Admitting: Internal Medicine

## 2017-01-08 ENCOUNTER — Ambulatory Visit (INDEPENDENT_AMBULATORY_CARE_PROVIDER_SITE_OTHER): Payer: Medicare Other | Admitting: Podiatry

## 2017-01-08 DIAGNOSIS — B351 Tinea unguium: Secondary | ICD-10-CM

## 2017-01-08 DIAGNOSIS — M79674 Pain in right toe(s): Secondary | ICD-10-CM

## 2017-01-08 DIAGNOSIS — M79675 Pain in left toe(s): Secondary | ICD-10-CM

## 2017-01-11 NOTE — Progress Notes (Signed)
Subjective: 76 y.o. returns the office today for painful, elongated, thickened toenails which she cannot trim herself. Denies any redness or drainage around the nails. She has continued with OTC nail fungus treatment. Denies any acute changes since last appointment and no new complaints today. Denies any systemic complaints such as fevers, chills, nausea, vomiting.   PCP: Madelin HeadingsPanosh, [name], MD Objective: AAO 3, NAD DP/PT pulses palpable, CRT less than 3 seconds Nails hypertrophic, dystrophic, elongated, brittle, discolored 10. There is tenderness overlying the nails 1-5 bilaterally. There is no surrounding erythema or drainage along the nail sites. No open lesions or pre-ulcerative lesions are identified. No other areas of tenderness bilateral lower extremities. No overlying edema, erythema, increased warmth. No pain with calf compression, swelling, warmth, erythema.  Assessment: Patient presents with symptomatic onychomycosis  Plan: -Treatment options including alternatives, risks, complications were discussed -Nails sharply debrided 10 without complication/bleeding. -Discussed daily foot inspection. If there are any changes, to call the office immediately.  -Follow-up in 3 months or sooner if any problems are to arise. In the meantime, encouraged to call the office with any questions, concerns, changes symptoms.  Ovid CurdMatthew Wagoner, DPM

## 2017-02-01 ENCOUNTER — Telehealth: Payer: Self-pay | Admitting: Family Medicine

## 2017-02-01 NOTE — Telephone Encounter (Signed)
Copied from CRM [phone] [id number]. Topic: Inquiry >> Feb 01, 2017 10:18 AM Windy KalataMichael, Taylor L, NT wrote: Reason for CRM: pt is calling states she missed a phone call about her lab results and would like a call back

## 2017-02-01 NOTE — Telephone Encounter (Signed)
Believe that was an old message or not from our office.  Last labs we have ar in October, which patient had results of.

## 2017-04-08 ENCOUNTER — Other Ambulatory Visit: Payer: Self-pay | Admitting: Internal Medicine

## 2017-04-16 ENCOUNTER — Ambulatory Visit: Payer: Medicare Other | Admitting: Podiatry

## 2017-04-19 ENCOUNTER — Ambulatory Visit: Payer: Medicare Other | Admitting: Podiatry

## 2017-04-19 ENCOUNTER — Encounter: Payer: Self-pay | Admitting: Podiatry

## 2017-04-19 DIAGNOSIS — M79674 Pain in right toe(s): Secondary | ICD-10-CM | POA: Diagnosis not present

## 2017-04-19 DIAGNOSIS — M79675 Pain in left toe(s): Secondary | ICD-10-CM | POA: Diagnosis not present

## 2017-04-19 DIAGNOSIS — B351 Tinea unguium: Secondary | ICD-10-CM

## 2017-04-21 NOTE — Progress Notes (Signed)
Chief Complaint  Patient presents with  . Follow-up    Husband here (with patient) to discuss patient's health    HPI: Pamela ApleySandra Alvarez 77 y.o. come in for Chronic disease management   But has been here with patient to did not really want to come and discuss this problem because the family and he are concerned about memory over the last number of months specifically word retrieval and conversation.  Patient herself only came to appease her husband and does not really want to come and talk about this.  No recent falling finishing up physical therapy for balance.  No unusual restrictive diet headache vision hearing change.  Feels  Cold all the time.   Left thumb. V sometimes gets caught and dysfunctional husband says her handwriting has changed.  No bowel or bladder incontinence changes.  No recent falls  In regard to her blood pressure she has not taken any medicine for a couple days some hesitation concerned about dizziness or side effects.  Patient is willing to treat hypertension with new studies about cognitive decline and blood pressure control.  She is taking cholesterol medicine. She gets concerned about radiation and x-rays and other procedures. ROS: See pertinent positives and negatives per HPI.  Past Medical History:  Diagnosis Date  . Cyst 07-2008   Cyst on back x2 that were drained  . H/O echocardiogram 02-2000  . Hx of colonic polyps   . Hyperlipidemia   . Varicose veins of both lower extremities     Family History  Problem Relation Age of Onset  . Hypertension Mother   . Parkinsonism Mother   . Angina Father   . Heart disease Father   . Colon cancer Father   . Lung cancer Father   . Skin cancer Father   . Cancer Father        Heart  . Breast cancer Sister        over 2650  . Diabetes Sister   . Heart disease Brother 2055       triple bypass surgery  . Macular degeneration Sister   . Diabetes Sister        prediabetic  . Hearing loss Sister    hearing problems  . Vasculitis Sister     Social History   Socioeconomic History  . Marital status: Married    Spouse name: None  . Number of children: None  . Years of education: None  . Highest education level: None  Social Needs  . Financial resource strain: None  . Food insecurity - worry: None  . Food insecurity - inability: None  . Transportation needs - medical: None  . Transportation needs - non-medical: None  Occupational History  . Occupation: retired    Comment: Social research officer, governmentchool psychologist [name]  Tobacco Use  . Smoking status: Never Smoker  . Smokeless tobacco: Never Used  Substance and Sexual Activity  . Alcohol use: Yes    Alcohol/week: 4.2 oz    Types: 7 Glasses of wine per week    Comment: one a day  . Drug use: Yes  . Sexual activity: None  Other Topics Concern  . None  Social History Narrative   Regular exercise-yes   Moved back to [place] from [place]   Hh of 2 cat   G3 P2    Exercises walking regu;arly    Retired  Social research officer, governmentchool psychologist          Outpatient Medications Prior to Visit  Medication Sig Dispense Refill  .  atorvastatin (LIPITOR) 10 MG tablet TAKE 1 TABLET (10 MG TOTAL) BY MOUTH DAILY. 90 tablet 2  . losartan (COZAAR) 25 MG tablet TAKE 1 TABLET EVERY DAY 90 tablet 0   No facility-administered medications prior to visit.      EXAM:  BP (!) 154/82 (BP Location: Right Arm, Patient Position: Sitting, Cuff Size: Normal)   Pulse 89   Temp 97.8 F (36.6 C) (Oral)   Wt 148 lb 11.2 oz (67.4 kg)   BMI 27.20 kg/m   Body mass index is 27.2 kg/m. Repeat blood pressure was 158/80 right arm sitting. GENERAL: vitals reviewed and listed above, alert, oriented, appears well hydrated and in no acute distress speech appears to be normal and average conversation specific memory test not done today. Looks younger than stated age  HEENT: atraumatic, conjunctiva  clear, no obvious abnormalities on inspection of external nose and ears OP : no lesion edema or  exudate tongue is midline EOMs are full follows directions simple. NECK: no obvious masses on inspection palpation  LUNGS: clear to auscultation bilaterally, no wheezes, rales or rhonchi, good air movement CV: HRRR, no clubbing cyanosis or  peripheral edema nl cap refill  MS: moves all extremities without noticeable focal  Abnormality Her gait is slightly wide-based no tremor or rigidity obvious negative Romberg.  Denies any numbness in her feet. PSYCH: pleasant and cooperative, very reluctant to go forward with any kind of evaluation.  Although husband said she worries about dementia. Lab Results  Component Value Date   WBC 7.7 12/18/2016   HGB 15.1 (H) 12/18/2016   HCT 45.7 12/18/2016   PLT 282.0 12/18/2016   GLUCOSE 92 12/18/2016   CHOL 257 (H) 12/18/2016   TRIG 105.0 12/18/2016   HDL 75.60 12/18/2016   LDLDIRECT 191.8 07/22/2012   LDLCALC 160 (H) 12/18/2016   ALT 15 12/18/2016   AST 21 12/18/2016   NA 142 12/18/2016   K 4.3 12/18/2016   CL 107 12/18/2016   CREATININE 0.86 12/18/2016   BUN 12 12/18/2016   CO2 27 12/18/2016   TSH 3.15 12/18/2016   BP Readings from Last 3 Encounters:  04/22/17 (!) 154/82  12/18/16 140/74  09/03/16 (!) 162/74    ASSESSMENT AND PLAN:  Discussed the following assessment and plan:  Cognitive change - Plan: Ambulatory referral to Neurology  Balance problem - Plan: TSH, T4, free, Basic metabolic panel, Vitamin B12, Iron, TIBC and Ferritin Panel, CBC with Differential/Platelet, Ambulatory referral to Neurology  Essential hypertension - Plan: TSH, T4, free, Basic metabolic panel, Vitamin B12, Iron, TIBC and Ferritin Panel, CBC with Differential/Platelet  Medication management - Plan: TSH, T4, free, Basic metabolic panel, Vitamin B12, Iron, TIBC and Ferritin Panel, CBC with Differential/Platelet, Lipid panel  Hyperlipidemia, unspecified hyperlipidemia type - Plan: TSH, T4, free, Basic metabolic panel, Vitamin B12, Iron, TIBC and Ferritin Panel,  CBC with Differential/Platelet, Lipid panel  Word finding difficulty - Plan: TSH, T4, free, Basic metabolic panel, Vitamin B12, Iron, TIBC and Ferritin Panel, CBC with Differential/Platelet, Ambulatory referral to Neurology  Cold intolerance - Plan: TSH, T4, free, Basic metabolic panel, Vitamin B12, Iron, TIBC and Ferritin Panel, CBC with Differential/Platelet Discussed with patient encouraged for further evaluation because family has concerned unclear if her balance problem occurred before or after her fall.  She has   Discussed possibility of a brain MRI to look for causes of her symptoms old stroke etc. but she would like to wait on that.  Did agree to see neurology with her  husband.  To look for causes of symptoms of concern. Checking metabolic today discussed blood pressure control and follow-up.  Potential side effects if she gets dizziness that is persistent with the losartan to contact us they are on my chart. Follow-up 2 months about blood pressure and condition.  Or earlier if needed. -Patient advised to return or notify health care team  if  new concerns arise. Total visit > 50% spent counseling and coordinating care as indicated in above note and in instructions to patient .  Expectant management about process poss of  Doff dx  And plan for bp control other options . Patient has always ben reluctant with meds  And procedure .    Patient Instructions  Advise more evaluation about the   Falling and  cognitive issues.   Plan  tsh .  And  b12 labs today   BP  Control. :    Take blood pressure readings twice a day for 7- 10 days   sending readings .    In my chart   To decide on med  Dosing  Get back on  The bp medication daily  an dif  bp readings are  Not at goal  We may increase  To 50 mg per day which is 2 tablets of 25 mg per day.   Neurology referral   You will be contacted .   Can hold off on mri until you see neurology .     Neta Mends. Nissa Stannard M.D.

## 2017-04-22 ENCOUNTER — Encounter: Payer: Self-pay | Admitting: Neurology

## 2017-04-22 ENCOUNTER — Ambulatory Visit: Payer: Medicare Other | Admitting: Internal Medicine

## 2017-04-22 ENCOUNTER — Encounter: Payer: Self-pay | Admitting: Internal Medicine

## 2017-04-22 VITALS — BP 154/82 | HR 89 | Temp 97.8°F | Wt 148.7 lb

## 2017-04-22 DIAGNOSIS — R4789 Other speech disturbances: Secondary | ICD-10-CM | POA: Diagnosis not present

## 2017-04-22 DIAGNOSIS — E785 Hyperlipidemia, unspecified: Secondary | ICD-10-CM | POA: Diagnosis not present

## 2017-04-22 DIAGNOSIS — R2689 Other abnormalities of gait and mobility: Secondary | ICD-10-CM

## 2017-04-22 DIAGNOSIS — R4189 Other symptoms and signs involving cognitive functions and awareness: Secondary | ICD-10-CM

## 2017-04-22 DIAGNOSIS — I10 Essential (primary) hypertension: Secondary | ICD-10-CM

## 2017-04-22 DIAGNOSIS — Z79899 Other long term (current) drug therapy: Secondary | ICD-10-CM | POA: Diagnosis not present

## 2017-04-22 DIAGNOSIS — R6889 Other general symptoms and signs: Secondary | ICD-10-CM | POA: Diagnosis not present

## 2017-04-22 LAB — CBC WITH DIFFERENTIAL/PLATELET
BASOS PCT: 0.6 % (ref 0.0–3.0)
Basophils Absolute: 0 10*3/uL (ref 0.0–0.1)
EOS ABS: 0.1 10*3/uL (ref 0.0–0.7)
Eosinophils Relative: 1.9 % (ref 0.0–5.0)
HCT: 43.5 % (ref 36.0–46.0)
Hemoglobin: 14.5 g/dL (ref 12.0–15.0)
LYMPHS ABS: 2.1 10*3/uL (ref 0.7–4.0)
Lymphocytes Relative: 26.4 % (ref 12.0–46.0)
MCHC: 33.4 g/dL (ref 30.0–36.0)
MCV: 93.1 fl (ref 78.0–100.0)
MONO ABS: 0.6 10*3/uL (ref 0.1–1.0)
Monocytes Relative: 8 % (ref 3.0–12.0)
NEUTROS PCT: 63.1 % (ref 43.0–77.0)
Neutro Abs: 5 10*3/uL (ref 1.4–7.7)
Platelets: 274 10*3/uL (ref 150.0–400.0)
RBC: 4.67 Mil/uL (ref 3.87–5.11)
RDW: 13.6 % (ref 11.5–15.5)
WBC: 7.9 10*3/uL (ref 4.0–10.5)

## 2017-04-22 LAB — BASIC METABOLIC PANEL
BUN: 15 mg/dL (ref 6–23)
CALCIUM: 9.4 mg/dL (ref 8.4–10.5)
CO2: 29 mEq/L (ref 19–32)
Chloride: 103 mEq/L (ref 96–112)
Creatinine, Ser: 0.99 mg/dL (ref 0.40–1.20)
GFR: 57.76 mL/min — AB (ref >60.00)
Glucose, Bld: 88 mg/dL (ref 70–99)
Potassium: 4.4 mEq/L (ref 3.5–5.1)
SODIUM: 140 meq/L (ref 135–145)

## 2017-04-22 LAB — LIPID PANEL
CHOLESTEROL: 233 mg/dL — AB (ref 0–200)
HDL: 71.6 mg/dL (ref >39.00)
LDL CALC: 139 mg/dL — AB (ref 0–99)
NonHDL: 161.74
TRIGLYCERIDES: 114 mg/dL (ref 0.0–149.0)
Total CHOL/HDL Ratio: 3
VLDL: 22.8 mg/dL (ref 0.0–40.0)

## 2017-04-22 LAB — TSH: TSH: 2.3 u[IU]/mL (ref 0.35–4.50)

## 2017-04-22 LAB — VITAMIN B12: Vitamin B-12: 286 pg/mL (ref 211–911)

## 2017-04-22 LAB — T4, FREE: FREE T4: 0.91 ng/dL (ref 0.60–1.60)

## 2017-04-22 NOTE — Patient Instructions (Addendum)
Advise more evaluation about the   Falling and  cognitive issues.   Plan  tsh .  And  b12 labs today   BP  Control. :    Take blood pressure readings twice a day for 7- 10 days   sending readings .    In my chart   To decide on med  Dosing  Get back on  The bp medication daily  an dif  bp readings are  Not at goal  We may increase  To 50 mg per day which is 2 tablets of 25 mg per day.   Neurology referral   You will be contacted .   Can hold off on mri until you see neurology .

## 2017-04-23 LAB — IRON,TIBC AND FERRITIN PANEL
%SAT: 37 % (ref 11–50)
Ferritin: 74 ng/mL (ref 20–288)
IRON: 120 ug/dL (ref 45–160)
TIBC: 321 ug/dL (ref 250–450)

## 2017-04-26 NOTE — Progress Notes (Signed)
Subjective: 77 y.o. returns the office today for painful, elongated, thickened toenails which she cannot trim herself. Denies any redness or drainage around the nails.  She still using over-the-counter nail fungus treatment which may be helping some.  Denies any acute changes since last appointment and no new complaints today. Denies any systemic complaints such as fevers, chills, nausea, vomiting.   PCP: Madelin HeadingsPanosh, [name], MD  Objective: AAO 3, NAD DP/PT pulses palpable, CRT less than 3 seconds Nails hypertrophic, dystrophic, elongated, brittle, discolored 10. There is tenderness overlying the nails 1-5 bilaterally. There is no surrounding erythema or drainage along the nail sites. No open lesions or pre-ulcerative lesions are identified. No other areas of tenderness bilateral lower extremities. No overlying edema, erythema, increased warmth. No pain with calf compression, swelling, warmth, erythema.  Assessment: Patient presents with symptomatic onychomycosis  Plan: -Treatment options including alternatives, risks, complications were discussed -Nails sharply debrided 10 without complication/bleeding. -Continue topical antifungal -Discussed daily foot inspection. If there are any changes, to call the office immediately.  -Follow-up in 3 months or sooner if any problems are to arise. In the meantime, encouraged to call the office with any questions, concerns, changes symptoms.  Ovid CurdMatthew Hershy Flenner, DPM

## 2017-04-28 ENCOUNTER — Encounter: Payer: Self-pay | Admitting: Neurology

## 2017-04-28 ENCOUNTER — Ambulatory Visit: Payer: Medicare Other | Admitting: Neurology

## 2017-04-28 VITALS — BP 140/82 | HR 95 | Ht 62.0 in | Wt 148.8 lb

## 2017-04-28 DIAGNOSIS — R413 Other amnesia: Secondary | ICD-10-CM | POA: Diagnosis not present

## 2017-04-28 NOTE — Patient Instructions (Signed)
In order to definitively make a diagnosis, we need to get an MRI of the brain to rule out other causes of memory problems.  I would like you to follow up after MRI to discuss results and how to proceed.

## 2017-04-28 NOTE — Progress Notes (Signed)
NEUROLOGY CONSULTATION NOTE  Pamela Alvarez MRN: [id number] DOB: 1940/04/02  Referring provider: Dr. Fabian Sharp Primary care provider: Dr. Fabian Sharp  Reason for consult:  Memory loss, balance problems  HISTORY OF PRESENT ILLNESS: Pamela Alvarez is a 77 year old left-handed female with hypertension and hyperlipidemia who presents for memory changes and balance problems.  She is accompanied by her husband who supplements history.  In July 2017, she fell down the stairs.  Since then, she has had 2 or 3 other falls.  She has hit her head.  Since the initial fall, her family has noticed changes in her memory.  She was always articulate but now has word finding difficulty.  Her long-term memory is intact but she has increased short-term memory problems.  She frequently repeats the same questions.  She has difficulty using the stove, however they moved to a new house in September so it is an Engineer, building services.  She does not misplace objects.  She does not have difficulty with ADLs such as bathing, dressing and using the toilet.  She has not driven for several years.  However, she appears to be disoriented at times when riding passenger on familiar routes.  She is more irritable.  She denies depression, although she is frustrated because her family insists that there is something wrong and she doesn't see it.  She sleeps well.  Her husband cannot comment if it is progressive.  She is a retired Counsellor.  She no longer reads updated journals.  She wrote a book several years ago and hasn't gotten around to editing it yet.    She denies family history of dementia.  She is currently in physical therapy to help with balance.  04/22/17 LABS:  TSH 2.30, free T4 0.91.  B12 was 286.  She was advised to start B12 daily.  PAST MEDICAL HISTORY: Past Medical History:  Diagnosis Date  . Cyst 07-2008   Cyst on back x2 that were drained  . H/O echocardiogram 02-2000  . Hx of colonic polyps   .  Hyperlipidemia   . Varicose veins of both lower extremities     PAST SURGICAL HISTORY: Past Surgical History:  Procedure Laterality Date  . POLYPECTOMY     Colon  . TONSILLECTOMY    . TUBAL LIGATION      MEDICATIONS: Current Outpatient Medications on File Prior to Visit  Medication Sig Dispense Refill  . cyanocobalamin 1000 MCG tablet Take 1,000 mcg by mouth daily.    Marland Kitchen atorvastatin (LIPITOR) 10 MG tablet TAKE 1 TABLET (10 MG TOTAL) BY MOUTH DAILY. 90 tablet 2  . losartan (COZAAR) 25 MG tablet TAKE 1 TABLET EVERY DAY 90 tablet 0   No current facility-administered medications on file prior to visit.     ALLERGIES: Allergies  Allergen Reactions  . Dust Mite Extract Cough  . Sulfonamide Derivatives     FAMILY HISTORY: Family History  Problem Relation Age of Onset  . Hypertension Mother   . Parkinsonism Mother   . Angina Father   . Heart disease Father   . Colon cancer Father   . Lung cancer Father   . Skin cancer Father   . Cancer Father        Heart  . Breast cancer Sister        over 40  . Diabetes Sister   . Heart disease Brother 46       triple bypass surgery  . Macular degeneration Sister   . Diabetes Sister  prediabetic  . Hearing loss Sister        hearing problems  . Vasculitis Sister     SOCIAL HISTORY: Social History   Socioeconomic History  . Marital status: Married    Spouse name: Not on file  . Number of children: Not on file  . Years of education: Not on file  . Highest education level: Not on file  Social Needs  . Financial resource strain: Not on file  . Food insecurity - worry: Not on file  . Food insecurity - inability: Not on file  . Transportation needs - medical: Not on file  . Transportation needs - non-medical: Not on file  Occupational History  . Occupation: retired    Comment: Social research officer, government [name]  Tobacco Use  . Smoking status: Never Smoker  . Smokeless tobacco: Never Used  Substance and Sexual Activity  .  Alcohol use: Yes    Alcohol/week: 4.2 oz    Types: 7 Glasses of wine per week    Comment: one a day  . Drug use: Yes  . Sexual activity: Not on file  Other Topics Concern  . Not on file  Social History Narrative   Regular exercise-yes   Moved back to [place] from [place]   Hh of 2 cat   G3 P2    Exercises walking regu;arly    Retired  Social research officer, government          REVIEW OF SYSTEMS: Constitutional: No fevers, chills, or sweats, no generalized fatigue, change in appetite Eyes: No visual changes, double vision, eye pain Ear, nose and throat: No hearing loss, ear pain, nasal congestion, sore throat Cardiovascular: No chest pain, palpitations Respiratory:  No shortness of breath at rest or with exertion, wheezes GastrointestinaI: No nausea, vomiting, diarrhea, abdominal pain, fecal incontinence Genitourinary:  No dysuria, urinary retention or frequency Musculoskeletal:  No neck pain, back pain Integumentary: No rash, pruritus, skin lesions Neurological: as above Psychiatric: No depression, insomnia, anxiety Endocrine: No palpitations, fatigue, diaphoresis, mood swings, change in appetite, change in weight, increased thirst Hematologic/Lymphatic:  No purpura, petechiae. Allergic/Immunologic: no itchy/runny eyes, nasal congestion, recent allergic reactions, rashes  PHYSICAL EXAM: Vitals:   04/28/17 1012  BP: 140/82  Pulse: 95  SpO2: 98%   General: No acute distress.  Patient appears well-groomed.  Head:  Normocephalic/atraumatic Eyes:  fundi examined but not visualized Neck: supple, no paraspinal tenderness, full range of motion Back: No paraspinal tenderness Heart: regular rate and rhythm Lungs: Clear to auscultation bilaterally. Vascular: No carotid bruits. Neurological Exam: Mental status: alert and oriented to person, place, and time, delayed recall poor, remote memory intact, fund of knowledge intact, attention and concentration impaired, speech overall fluent but sometimes  with slight hesitancy, not dysarthric, naming fluency poor, unable to correctly complete Trail Making Test, copy a cube or draw a clock. Montreal Cognitive Assessment  04/28/2017  Visuospatial/ Executive (0/5) 0  Naming (0/3) 2  Attention: Read list of digits (0/2) 1  Attention: Read list of letters (0/1) 0  Attention: Serial 7 subtraction starting at 100 (0/3) 0  Language: Repeat phrase (0/2) 2  Language : Fluency (0/1) 0  Abstraction (0/2) 2  Delayed Recall (0/5) 0  Orientation (0/6) 6  Total 13   Cranial nerves: CN I: not tested CN II: pupils equal, round and reactive to light, visual fields intact CN III, IV, VI:  full range of motion, no nystagmus, no ptosis CN V: facial sensation intact CN VII: upper and lower face symmetric  CN VIII: hearing intact CN IX, X: gag intact, uvula midline CN XI: sternocleidomastoid and trapezius muscles intact CN XII: tongue midline Bulk & Tone: normal, no fasciculations. Motor:  5/5 throughout  Sensation: temperature and vibration sensation intact. Deep Tendon Reflexes:  2+ throughout, toes downgoing.  Finger to nose testing:  Without dysmetria.  Heel to shin:  Without dysmetria.  Gait:  Mildly wide-based gait with upright posture and bilateral arm swing.  Able to turn, difficulty with tandem walk. Romberg negative.  IMPRESSION: Dementia.  I suspect Alzheimer's disease.  However, I cannot make a definite diagnosis until we get imaging of the brain.  We cannot proceed with treatment until we rule out secondary causes (such as stroke, tumor or chronic subdural hematomas).  She is resistant about getting an MRI at this time.    PLAN: 1.  We will place order for MRI of brain without contrast.  Her family will try to convince her to be agreeable to MRI.  She will follow up afterwards for further recommendations and therapy. 2.  Continue B12 1000 mcg daily  Thank you for allowing me to take part in the care of this patient.  Shon MilletAdam Nels Munn, DO  CC:    Berniece AndreasWanda Panosh, MD

## 2017-07-09 ENCOUNTER — Other Ambulatory Visit: Payer: Self-pay | Admitting: Internal Medicine

## 2017-07-20 ENCOUNTER — Ambulatory Visit: Payer: Medicare Other | Admitting: Podiatry

## 2017-07-27 ENCOUNTER — Ambulatory Visit: Payer: Medicare Other | Admitting: Podiatry

## 2017-07-27 ENCOUNTER — Encounter: Payer: Self-pay | Admitting: Podiatry

## 2017-07-27 DIAGNOSIS — M79675 Pain in left toe(s): Secondary | ICD-10-CM

## 2017-07-27 DIAGNOSIS — M79674 Pain in right toe(s): Secondary | ICD-10-CM

## 2017-07-27 DIAGNOSIS — B351 Tinea unguium: Secondary | ICD-10-CM

## 2017-07-28 NOTE — Progress Notes (Signed)
Subjective: 77 y.o. returns the office today for painful, elongated, thickened toenails which she cannot trim herself. Denies any redness or drainage around the nails.  She still using over-the-counter nail fungus treatment which may be helping. Denies any acute changes since last appointment and no new complaints today. Denies any systemic complaints such as fevers, chills, nausea, vomiting.   PCP: [name], MD  Objective: AAO 3, NAD DP/PT pulses palpable, CRT less than 3 seconds Nails hypertrophic, dystrophic, elongated, brittle, discolored 10. There is tenderness overlying the nails 1-5 bilaterally. There is no surrounding erythema or drainage along the nail sites.  Overall the color to the nail is getting better. No open lesions or pre-ulcerative lesions are identified. No other areas of tenderness bilateral lower extremities. No overlying edema, erythema, increased warmth. No pain with calf compression, swelling, warmth, erythema.  Assessment: Patient presents with symptomatic onychomycosis  Plan: -Treatment options including alternatives, risks, complications were discussed -Nails sharply debrided 10 without complication/bleeding. -Continue topical antifungal -Discussed daily foot inspection. If there are any changes, to call the office immediately.  -Follow-up as needed at her request.  On her continue the topical antifungal but if she is between the nails or has any other issues to let me know and she agrees with this plan.  In the meantime, encouraged to call the office with any questions, concerns, changes symptoms.  Ovid Curd, DPM

## 2017-10-01 ENCOUNTER — Other Ambulatory Visit: Payer: Self-pay | Admitting: Internal Medicine

## 2017-10-07 ENCOUNTER — Other Ambulatory Visit: Payer: Self-pay | Admitting: Internal Medicine

## 2017-12-13 ENCOUNTER — Ambulatory Visit: Payer: Medicare Other | Admitting: Sports Medicine

## 2017-12-13 ENCOUNTER — Encounter: Payer: Self-pay | Admitting: Sports Medicine

## 2017-12-13 ENCOUNTER — Ambulatory Visit (INDEPENDENT_AMBULATORY_CARE_PROVIDER_SITE_OTHER): Payer: Medicare Other

## 2017-12-13 VITALS — BP 140/80 | HR 92 | Ht 62.0 in | Wt 150.2 lb

## 2017-12-13 DIAGNOSIS — R269 Unspecified abnormalities of gait and mobility: Secondary | ICD-10-CM | POA: Diagnosis not present

## 2017-12-13 DIAGNOSIS — S82892S Other fracture of left lower leg, sequela: Secondary | ICD-10-CM | POA: Diagnosis not present

## 2017-12-13 DIAGNOSIS — G8929 Other chronic pain: Secondary | ICD-10-CM

## 2017-12-13 DIAGNOSIS — R262 Difficulty in walking, not elsewhere classified: Secondary | ICD-10-CM

## 2017-12-13 DIAGNOSIS — M25561 Pain in right knee: Secondary | ICD-10-CM

## 2017-12-13 MED ORDER — DICLOFENAC SODIUM 1 % TD GEL: TRANSDERMAL | 1 refills | Status: DC

## 2017-12-13 NOTE — Assessment & Plan Note (Signed)
She has a bipartite patella.  That may be slightly symptomatic but I believe the underlying issue is difficulty with walking secondary to being fearful of her recurrent fall.  She does have some balance issues and underlying spinal stenosis is likely contributing.  We will plan to follow-up with her in 6 weeks to check on her progress and if any lack of improvement with formal physical therapy and Voltaren gel will plan to obtain an MRI of her lumbar spine and/or nerve conduction studies.

## 2017-12-13 NOTE — Progress Notes (Signed)
Pamela Alvarez. Pamela Alvarez [facility] at [facility] [phone]  Isaly Fasching - 77 y.o. female MRN [id number]  Date of birth: 04-Sep-1940  Visit Date: 12/13/2017  PCP: [name], MD   Referred by: [name], MD   Scribe(s) for today's visit: Stevenson Clinch, CMA  SUBJECTIVE:  Pamela Alvarez is here for Initial Assessment (R knee pain)   HPI: Her R knee pain symptoms INITIALLY: Began several years ago and has worsened over the past 2 years since breaking L ankle. She denies past injury to the R knee.  Described as moderate (5/10) aching, nonradiating Worsened with going down stairs. Improved with rest.  Additional associated symptoms include: She feels pain all around the knee. She denies swelling around the knee. In the past she has had trouble with clicking/popping in the knee. She denies catching, feeling that the knee will give out.     At this time symptoms are worsening compared to onset. She has tried Aspercreme with some relief. In the past she has tried a knee brace but she doesn't walk as much as she used to so she hasn't been wearing it.   No recent XR of R knee. She has seen Dr. Lorretta Harp in the past for her R ankle.   She reports tightness in the L calf at night. She does palates twice weekly for stretching, she notices some pain in the calf afterwards. She denies increased warmth or erythema in the calf.     REVIEW OF SYSTEMS: Reports night time disturbances. Denies fevers, chills, or night sweats. Denies unexplained weight loss. Denies personal history of cancer. Denies changes in bowel or bladder habits. Reports recent unreported falls x 4, bruising, no other injuries. Denies new or worsening dyspnea or wheezing. Denies headaches or dizziness.  Reports numbness, tingling in the thumb L hand x 2 yrs, started after a fall.   Denies dizziness or presyncopal episodes Denies lower extremity edema    HISTORY:  Prior  history reviewed and updated per electronic medical record.  Social History   Occupational History  . Occupation: retired    Comment: Social research officer, government [name]  Tobacco Use  . Smoking status: Never Smoker  . Smokeless tobacco: Never Used  Substance and Sexual Activity  . Alcohol use: Yes    Alcohol/week: 7.0 standard drinks    Types: 7 Glasses of wine per week    Comment: one a day  . Drug use: Yes  . Sexual activity: Not on file   Social History   Social History Narrative   Regular exercise-yes   Moved back to [place] from [place]   Hh of 2 cat   G3 P2    Exercises walking regu;arly    Retired  Social research officer, government   Drinks one cup of coffee  A day. In addition to walking QD she and her husband take a pilates class on Thursdays. She lives with her husband in a 2 story house, though the master bedroom in on the first level.        Past Medical History:  Diagnosis Date  . Cyst 07-2008   Cyst on back x2 that were drained  . H/O echocardiogram 02-2000  . Hx of colonic polyps   . Hyperlipidemia   . Varicose veins of both lower extremities    Past Surgical History:  Procedure Laterality Date  . POLYPECTOMY     Colon  . TONSILLECTOMY    . TUBAL LIGATION  family history includes Angina in her father; Breast cancer in her sister; Cancer in her father; Colon cancer in her father; Diabetes in her sister and sister; Hearing loss in her sister; Heart disease in her father; Heart disease (age of onset: 65) in her brother; Hypertension in her mother; Lung cancer in her father; Macular degeneration in her sister; Parkinsonism in her mother; Skin cancer in her father; Vasculitis in her sister.  DATA OBTAINED & REVIEWED:  No results for input(s): HGBA1C, LABURIC, CREATINE in the last 8760 hours. . 12/13/2017: X-rays  o lumbar spine: Well-maintained disc space with slight facet arthrosis most notably at the L5-S1 level. o right knee: Well-maintained joint spaces, slight chondrocalcinosis,  bipartite patella .   OBJECTIVE:  VS:  HT:5\' 2"  (157.5 cm)   WT:150 lb 3.2 oz (68.1 kg)  BMI:27.46    BP:140/80  HR:92bpm  TEMP: ( )  RESP:94 %   PHYSICAL EXAM: CONSTITUTIONAL: Well-developed, Well-nourished and In no acute distress PSYCHIATRIC: Alert & appropriately interactive. and Not depressed or anxious appearing. RESPIRATORY: No increased work of breathing and Trachea Midline EYES: Pupils are equal., EOM intact without nystagmus. and No scleral icterus.  VASCULAR EXAM: Warm and well perfused NEURO: Bilateral patellar reflexes are 3+/4.  Bilateral Achilles reflexes are trace.  Her lower extremity strength is intact in lower extremity myotomes.  Lower extremity sensation intact in dermatomes she does report some dysesthesia in bilateral feet  MSK Exam: Right knee  Well aligned, no significant deformity. No overlying skin changes. No focal bony tenderness   RANGE OF MOTION & STRENGTH  Normal flexion-extension.  She walks with an antalgic gait.   SPECIALITY TESTING:  No pain with McMurray's, Thessaly.  Ligamentously stable     ASSESSMENT   1. Chronic pain of right knee   2. Difficulty in walking   3. Abnormality of gait   4. Right anterior knee pain   5. Closed fracture of left ankle, sequela     PLAN:  Pertinent additional documentation may be included in corresponding procedure notes, imaging studies, problem based documentation and patient instructions.  Procedures:  . None  Medications:  Meds ordered this encounter  Medications  . diclofenac sodium (VOLTAREN) 1 % GEL    Sig: Apply topically to affected area qid    Dispense:  100 g    Refill:  1   Discussion/Instructions: Right anterior knee pain She has a bipartite patella.  That may be slightly symptomatic but I believe the underlying issue is difficulty with walking secondary to being fearful of her recurrent fall.  She does have some balance issues and underlying spinal stenosis is likely  contributing.  We will plan to follow-up with her in 6 weeks to check on her progress and if any lack of improvement with formal physical therapy and Voltaren gel will plan to obtain an MRI of her lumbar spine and/or nerve conduction studies.  . Discussed red flag symptoms that warrant earlier emergent evaluation and patient voices understanding. . Activity modifications and the importance of avoiding exacerbating activities (limiting pain to no more than a 4 / 10 during or following activity) recommended and discussed.  Follow-up:  . Return in about 6 weeks (around 01/24/2018) for repeat clinical exam.   . If any lack of improvement consider: further diagnostic evaluation with MRI of the lumbar spine     CMA/ATC served as scribe during this visit. History, Physical, and Plan performed by medical provider. Documentation and orders reviewed and attested to.  Gerda Diss, [facility] Physician

## 2018-01-10 ENCOUNTER — Ambulatory Visit (INDEPENDENT_AMBULATORY_CARE_PROVIDER_SITE_OTHER): Payer: Medicare Other | Admitting: *Deleted

## 2018-01-10 DIAGNOSIS — Z23 Encounter for immunization: Secondary | ICD-10-CM

## 2018-01-17 ENCOUNTER — Telehealth: Payer: Self-pay

## 2018-01-17 DIAGNOSIS — R413 Other amnesia: Secondary | ICD-10-CM

## 2018-01-17 NOTE — Telephone Encounter (Signed)
Copied from CRM [id number]. Topic: Referral - Request for Referral >> Jan 17, 2018  8:31 AM Jay Schlichter wrote: Has patient seen PCP for this complaint? Yes.   *If NO, is insurance requiring patient see PCP for this issue before PCP can refer them? Referral for which specialty: neurology Preferred provider/office: [facility] Reason for referral: same as last referral  - didn't like who she was referred to the last time Pt is asking to be able get in sooner - the soonest appt is in Jan 2020 Cb for pt is [phone]

## 2018-01-17 NOTE — Telephone Encounter (Signed)
Referral placed.

## 2018-01-17 NOTE — Telephone Encounter (Signed)
Ok to do another referral  For opinion for memory problems  Last ov for eval was 2 19 ?

## 2018-01-21 ENCOUNTER — Other Ambulatory Visit: Payer: Self-pay | Admitting: Internal Medicine

## 2018-01-23 ENCOUNTER — Other Ambulatory Visit: Payer: Self-pay | Admitting: Internal Medicine

## 2018-01-24 ENCOUNTER — Encounter: Payer: Self-pay | Admitting: Sports Medicine

## 2018-01-24 ENCOUNTER — Ambulatory Visit: Payer: Medicare Other | Admitting: Sports Medicine

## 2018-01-24 VITALS — BP 130/80 | HR 86 | Ht 62.0 in | Wt 149.8 lb

## 2018-01-24 DIAGNOSIS — R262 Difficulty in walking, not elsewhere classified: Secondary | ICD-10-CM

## 2018-01-24 DIAGNOSIS — M1812 Unilateral primary osteoarthritis of first carpometacarpal joint, left hand: Secondary | ICD-10-CM

## 2018-01-24 DIAGNOSIS — R269 Unspecified abnormalities of gait and mobility: Secondary | ICD-10-CM

## 2018-01-24 DIAGNOSIS — M25561 Pain in right knee: Secondary | ICD-10-CM

## 2018-01-24 DIAGNOSIS — G8929 Other chronic pain: Secondary | ICD-10-CM

## 2018-01-24 NOTE — Progress Notes (Signed)
Pamela FellsMichael D. Delorise Shinerigby, DO  [facility] [phone]  Pamela ApleySandra Alvarez - 77 y.o. female MRN [id number]  Date of birth: 11/29/1940  Visit Date: 01/24/2018  PCP: Pamela HeadingsPanosh, [name], MD   Referred by: Pamela HeadingsPanosh, [name], MD   Scribe(s) for today's visit: Pamela FabianMolly Alvarez, LAT, ATC  SUBJECTIVE:  Pamela ApleySandra Alvarez is here for Follow-up (R knee pain)   HPI: Her R knee pain symptoms INITIALLY: Began several years ago and has worsened over the past 2 years since breaking L ankle. She denies past injury to the R knee.  Described as moderate (5/10) aching, nonradiating Worsened with going down stairs. Improved with rest.  Additional associated symptoms include: She feels pain all around the knee. She denies swelling around the knee. In the past she has had trouble with clicking/popping in the knee. She denies catching, feeling that the knee will give out.     At this time symptoms are worsening compared to onset. She has tried Aspercreme with some relief. In the past she has tried a knee brace but she doesn't walk as much as she used to so she hasn't been wearing it.   No recent XR of R knee. She has seen Dr. Lorretta HarpLandua in the past for her R ankle.   She reports tightness in the L calf at night. She does palates twice weekly for stretching, she notices some pain in the calf afterwards. She denies increased warmth or erythema in the calf.    01/24/2018: Compared to the last office visit on 12/13/17, her previously described R knee symptoms show no change  Current symptoms are moderate 5/10 aching pain & are nonradiating She has been using Aspercreme.  She got the Voltaren get but has not tried it yet.  She has tried a knee brace previously but is not currently wearing/using it.  She has not been to PT since she never received a call about scheduling an intiial evaluation.  [facility] and doing well.    R knee and L-spine XR - 12/13/17  REVIEW OF  SYSTEMS: Reports night time disturbances. Denies fevers, chills, or night sweats. Denies unexplained weight loss. Denies personal history of cancer. Denies changes in bowel or bladder habits. Denies recent unreported falls. Denies new or worsening dyspnea or wheezing. Denies headaches or dizziness.  Reports numbness, tingling in the thumb L hand x 2 yrs, started after a fall.   Denies dizziness or presyncopal episodes Denies lower extremity edema    HISTORY:  Prior history reviewed and updated per electronic medical record.  Social History   Occupational History  . Occupation: retired    Comment: Social research officer, governmentchool psychologist [name]  Tobacco Use  . Smoking status: Never Smoker  . Smokeless tobacco: Never Used  Substance and Sexual Activity  . Alcohol use: Yes    Alcohol/week: 7.0 standard drinks    Types: 7 Glasses of wine per week    Comment: one a day  . Drug use: Yes  . Sexual activity: Not on file   Social History   Social History Narrative   Regular exercise-yes   Moved back to [place] from [place]   Hh of 2 cat   G3 P2    Exercises walking regu;arly    Retired  Social research officer, governmentchool psychologist   Drinks one cup of coffee  A day. In addition to walking QD she and her husband take a pilates class on Thursdays. She lives with her husband in a  2 story house, though the master bedroom in on the first level.        Past Medical History:  Diagnosis Date  . Cyst 07-2008   Cyst on back x2 that were drained  . H/O echocardiogram 02-2000  . Hx of colonic polyps   . Hyperlipidemia   . Varicose veins of both lower extremities    Past Surgical History:  Procedure Laterality Date  . POLYPECTOMY     Colon  . TONSILLECTOMY    . TUBAL LIGATION     family history includes Angina in her father; Breast cancer in her sister; Cancer in her father; Colon cancer in her father; Diabetes in her sister and sister; Hearing loss in her sister; Heart disease in her father; Heart disease (age of onset: 40) in her  brother; Hypertension in her mother; Lung cancer in her father; Macular degeneration in her sister; Parkinsonism in her mother; Skin cancer in her father; Vasculitis in her sister.  DATA OBTAINED & REVIEWED:  No results for input(s): HGBA1C, LABURIC, CREATINE in the last 8760 hours. . 12/13/2017: X-rays  o lumbar spine: Well-maintained disc space with slight facet arthrosis most notably at the L5-S1 level. o right knee: Well-maintained joint spaces, slight chondrocalcinosis, bipartite patella  OBJECTIVE:  VS:  HT:5\' 2"  (157.5 cm)   WT:149 lb 12.8 oz (67.9 kg)  BMI:27.39    BP:130/80  HR:86bpm  TEMP: ( )  RESP:94 %   PHYSICAL EXAM: CONSTITUTIONAL: Well-developed, Well-nourished and In no acute distress PSYCHIATRIC: Alert & appropriately interactive. and Not depressed or anxious appearing. RESPIRATORY: No increased work of breathing and Trachea Midline EYES: Pupils are equal., EOM intact without nystagmus. and No scleral icterus.  VASCULAR EXAM: Warm and well perfused NEURO: Bilateral patellar reflexes are 3+/4.  Bilateral Achilles reflexes are trace.  Her lower extremity strength is intact in lower extremity myotomes.  Lower extremity sensation intact in dermatomes she does report some dysesthesia in bilateral feet  MSK Exam: Right knee  Well aligned, no significant deformity. No overlying skin changes. No focal bony tenderness   RANGE OF MOTION & STRENGTH  Normal flexion-extension.  She walks with an antalgic gait.   SPECIALITY TESTING:  No pain with McMurray's, Thessaly.  Ligamentously stable     Left hand: Overall well aligned.  No significant deformity.  She has slight bossing of the [facility] joint on the left compared to the right this is minimal.  No pain with Lourena Simmonds testing.  Pain with axial load and circumduction of the CMC joint but this is minimal.  Slight radial deviation of the thumb in general.  ASSESSMENT   1. Chronic pain of right knee   2. Difficulty in  walking   3. Abnormality of gait   4. Right anterior knee pain   5. Arthritis of carpometacarpal (CMC) joint of left thumb     PLAN:  Pertinent additional documentation may be included in corresponding procedure notes, imaging studies, problem based documentation and patient instructions.  Procedures:  . None  Medications:  No orders of the defined types were placed in this encounter.  Discussion/Instructions: Right anterior knee pain Underlying chondrocalcinosis likely more secondary than primary.  She has a nonfocal knee symptoms and this does likely reflect more of a functional gait disturbance as well as possible low back radiculopathy/radiculitis no benefit from physical therapy.  They will schedule this on the way out today as it has been approved.  I would like for her to reconsider using the Voltaren  gel and this was discussed with her in detail regarding the low likelihood of experiencing side effects that are mentioned given the lack of systemic absorption.  Arthritis of carpometacarpal (CMC) joint of left thumb Discussed that the Voltaren gel prescribed for her knee can also be beneficial for her thumb.  If any lack of improvement plain film x-rays will be obtained.  . Discussed red flag symptoms that warrant earlier emergent evaluation and patient voices understanding. . Activity modifications and the importance of avoiding exacerbating activities (limiting pain to no more than a 4 / 10 during or following activity) recommended and discussed.  Follow-up:  . Return in about 6 weeks (around 03/07/2018).   . If any lack of improvement consider: consider further diagnostic evaluation with MRI of the lumbar spine     CMA/ATC served as scribe during this visit. History, Physical, and Plan performed by medical provider. Documentation and orders reviewed and attested to.      Andrena Mews, DO    [facility] Physician

## 2018-01-24 NOTE — Assessment & Plan Note (Signed)
Discussed that the Voltaren gel prescribed for her knee can also be beneficial for her thumb.  If any lack of improvement plain film x-rays will be obtained.

## 2018-01-24 NOTE — Assessment & Plan Note (Signed)
Underlying chondrocalcinosis likely more secondary than primary.  She has a nonfocal knee symptoms and this does likely reflect more of a functional gait disturbance as well as possible low back radiculopathy/radiculitis no benefit from physical therapy.  They will schedule this on the way out today as it has been approved.  I would like for her to reconsider using the Voltaren gel and this was discussed with her in detail regarding the low likelihood of experiencing side effects that are mentioned given the lack of systemic absorption.

## 2018-02-01 ENCOUNTER — Ambulatory Visit: Payer: Medicare Other | Admitting: Physical Therapy

## 2018-02-01 DIAGNOSIS — G8929 Other chronic pain: Secondary | ICD-10-CM

## 2018-02-01 DIAGNOSIS — M25561 Pain in right knee: Secondary | ICD-10-CM

## 2018-02-01 DIAGNOSIS — R2689 Other abnormalities of gait and mobility: Secondary | ICD-10-CM

## 2018-02-01 NOTE — Patient Instructions (Signed)
Access Code: [email or web address]   URL: [email or web address]  Date: 02/01/2018  Prepared by: Sedalia MutaLauren Marck Mcclenny   Exercises  Supine Quadricep Sets - 10 reps - 2 sets - 1x daily  Straight Leg Raise - 10 reps - 2 sets - 1x daily  Seated Long Arc Quad - 10 reps - 2 sets - 2x daily  Seated Hamstring Stretch - 3 reps - 30 hold - 2x daily

## 2018-02-01 NOTE — Therapy (Signed)
[facility] [address] [place], [place], [address] Phone: [phone]   Fax:  [phone]  Physical Therapy Evaluation  Patient Details  Name: Pamela Alvarez MRN: [id number] Date of Birth: [birth year] Referring Provider (PT): Gaspar Bidding   Encounter Date: 02/01/2018  PT End of Session - 02/01/18 1356    Visit Number  1    Number of Visits  12    Date for PT Re-Evaluation  03/15/18    Authorization Type  [employer]    PT Start Time  1347    PT Stop Time  1425    PT Time Calculation (min)  38 min    Activity Tolerance  Patient tolerated treatment well    Behavior During Therapy  [facility] for tasks assessed/performed;Flat affect       Past Medical History:  Diagnosis Date  . Cyst 07-2008   Cyst on back x2 that were drained  . H/O echocardiogram 02-2000  . Hx of colonic polyps   . Hyperlipidemia   . Varicose veins of both lower extremities     Past Surgical History:  Procedure Laterality Date  . POLYPECTOMY     Colon  . TONSILLECTOMY    . TUBAL LIGATION      There were no vitals filed for this visit.   Subjective Assessment - 02/01/18 1349    Subjective  Pt states fall down stairs, years ago, which started knee pain (about 2 years ago). She has not had any treatment for it thus far.  She also recently fell down stairs in garage, and hurt L ankle, It is better now. She states most pain in R knee when she is going down stairs. She does have lift on the stairs but does not use it.     Limitations  Standing;Walking;House hold activities    Patient Stated Goals  decreased pain     Currently in Pain?  Yes    Pain Score  2     Pain Location  Knee    Pain Orientation  Right    Pain Descriptors / Indicators  Aching    Pain Type  Chronic pain    Pain Onset  More than a month ago    Pain Frequency  Intermittent    Aggravating Factors   going up/down stairs.          [facility] PT Assessment - 02/01/18 0001      Assessment   Medical  Diagnosis  R knee pain    Referring Provider (PT)  Gaspar Bidding    Prior Therapy  no      Precautions   Precautions  Fall    Precaution Comments  Has had previous falls      Balance Screen   Has the patient fallen in the past 6 months  Yes    How many times?  2    Has the patient had a decrease in activity level because of a fear of falling?   No    Is the patient reluctant to leave their home because of a fear of falling?   No      Prior Function   Level of Independence  Independent      Cognition   Overall Cognitive Status  Within Functional Limits for tasks assessed      ROM / Strength   AROM / PROM / Strength  AROM;Strength;PROM      AROM   Overall AROM Comments  L knee: WNL  AROM Assessment Site  Knee    Right/Left Knee  Right    Right Knee Extension  -5    Right Knee Flexion  128      PROM   PROM Assessment Site  Knee    Right/Left Knee  Right    Right Knee Extension  -2    Right Knee Flexion  135      Strength   Overall Strength Comments  L knee: 4/5    Strength Assessment Site  Knee    Right/Left Knee  Right    Right Knee Flexion  4/5    Right Knee Extension  4-/5      Palpation   Palpation comment  minimal pain to palpate R knee today; No pain to palpate patella tendon, but pt states pain in this location with stairs.       Special Tests   Other special tests  R knee: All Neg;  Formal balance testing to be done next visit.       Ambulation/Gait   Gait Comments  Slow, cautious gait, decreased balance, Standing static:normal;  Dynamic: fair       Standardized Balance Assessment   Standardized Balance Assessment  Timed Up and Go Test      Timed Up and Go Test   Normal TUG (seconds)  21.4    TUG Comments  indicates high fall risk.                Objective measurements completed on examination: See above findings.      [facility] Adult PT Treatment/Exercise - 02/01/18 0001      Exercises   Exercises  Knee/Hip      Knee/Hip Exercises:  Stretches   Active Hamstring Stretch  3 reps;30 seconds      Knee/Hip Exercises: Seated   Long Arc Quad  20 reps      Knee/Hip Exercises: Supine   Quad Sets  10 reps    Straight Leg Raises  10 reps;Both             PT Education - 02/01/18 1455    Education Details  PT POC, HEP    Person(s) Educated  Patient    Methods  Explanation;Verbal cues;Handout    Comprehension  Verbalized understanding;Need further instruction;Verbal cues required;Returned demonstration       PT Short Term Goals - 02/01/18 1500      PT SHORT TERM GOAL #1   Title  independent with initial HEP    Time  2    Period  Weeks    Status  New    Target Date  02/15/18      PT SHORT TERM GOAL #2   Title  Pt to demo ability to safely navigate 3 stairs with 2 hand rails, with step over step pattern.     Time  2    Period  Weeks    Status  New    Target Date  02/15/18        PT Long Term Goals - 02/01/18 1502      PT LONG TERM GOAL #1   Title  independent with long term HEP    Time  6    Period  Weeks    Status  New    Target Date  03/15/18      PT LONG TERM GOAL #2   Title  Pt to demo ability for safe navigation of at least 5 stairs, with step over step pattern, with 1  hand rail, to improve safety at home.     Time  6    Period  Weeks    Status  New    Target Date  03/15/18      PT LONG TERM GOAL #3   Title  Pt to report decreased pain in R knee, to 0-2/10 with walking and stairs, to improve ability for community activities.     Time  6    Period  Weeks    Status  New    Target Date  03/15/18      PT LONG TERM GOAL #4   Title  Balance Goals: TBD next visit    Time  6    Period  Weeks    Status  New    Target Date  03/15/18             Plan - 02/01/18 1456    Clinical Impression Statement  Pt presents with primary complaint of increased pain in R knee. She has mild strength deficits in R knee, and lack of effective HEP. She also has decreased endurance and ability for  ambulation, and has had increased falls in last 6 months. She has slow, cautious gait pattern, with decreased dynamic balance. Formal balance testing to be done next visit, but pt in need of skilled care primarily for gait/balance/safety, and knee pain secondary. Pt and husband in agreement with this. Husband currently assisting pt with most mobility outside of home, due to her recent falls. Pt with recent fall with ankle fracture, which may have contributed to recent increase in R knee pain, as well as decreased balance. Pt appears to have decreased/altered memory, she has difficulty recalling times of injuries, and falls. Also is very brief when answering questions, and does not elaborate on any information about condition or pain. Husband able to provide # of falls in last 6 months.     Clinical Presentation  Stable    Clinical Decision Making  Low    Rehab Potential  Good    PT Frequency  2x / week    PT Duration  6 weeks    PT Treatment/Interventions  ADLs/Self Care Home Management;Cryotherapy;Electrical Stimulation;Iontophoresis 4mg /ml Dexamethasone;Moist Heat;Therapeutic activities;Functional mobility training;Stair training;Gait training;DME Instruction;Ultrasound;Therapeutic exercise;Balance training;Neuromuscular re-education;Patient/family education;Dry needling;Passive range of motion;Manual techniques;Taping;Spinal Manipulations;Joint Manipulations    PT Next Visit Plan  Formal balance testing, BERG and or DGI.     Consulted and Agree with Plan of Care  Patient       Patient will benefit from skilled therapeutic intervention in order to improve the following deficits and impairments:  Abnormal gait, Decreased endurance, Pain, Decreased strength, Decreased activity tolerance, Decreased balance, Decreased mobility, Difficulty walking, Improper body mechanics, Decreased safety awareness  Visit Diagnosis: Chronic pain of right knee  Other abnormalities of gait and mobility     Problem  List Patient Active Problem List   Diagnosis Date Noted  . Arthritis of carpometacarpal [facility](CMC) joint of left thumb 01/24/2018  . Ankle fracture, left 09/03/2016  . Hand dysfunction 03/11/2016  . Symptomatic varicose veins, bilateral 03/11/2016  . Numbness of left thumb 03/11/2016  . Elevated BP 07/27/2014  . Bunion of left foot 07/26/2014  . Abnormality of gait 07/26/2014  . Cough 11/10/2013  . Post-nasal drainage 11/10/2013  . Abnormal urine odor 11/10/2013  . Visit for preventive health examination 07/28/2013  . Right anterior knee pain 07/22/2012  . Medicare annual wellness visit, subsequent 07/25/2011  . ANXIETY, SITUATIONAL 09/25/2009  . ARTHRITIS 09/25/2009  .  Vitamin D deficiency 09/27/2008  . Hyperlipidemia 09/17/2008  . Elevated blood pressure reading without diagnosis of hypertension 09/17/2008  . COLONIC POLYPS, HX OF 09/17/2008  . PERSONAL HISTORY DISEASES SKIN&SUBCUT TISSUE 09/17/2008    Sedalia Muta, PT, DPT 3:07 PM  02/01/18    [facility] [address] [place], [place], [address] Phone: [phone]   Fax:  [phone]  Name: Pamela Alvarez MRN: [id number] Date of Birth: [birth year]

## 2018-02-07 ENCOUNTER — Ambulatory Visit: Payer: Medicare Other | Admitting: Physical Therapy

## 2018-02-07 ENCOUNTER — Encounter: Payer: Self-pay | Admitting: Physical Therapy

## 2018-02-07 DIAGNOSIS — M25561 Pain in right knee: Secondary | ICD-10-CM

## 2018-02-07 DIAGNOSIS — G8929 Other chronic pain: Secondary | ICD-10-CM

## 2018-02-07 DIAGNOSIS — R2689 Other abnormalities of gait and mobility: Secondary | ICD-10-CM

## 2018-02-07 NOTE — Therapy (Signed)
[facility] [address] [place], [place], [address] Phone: [phone]   Fax:  [phone]  Physical Therapy Treatment  Patient Details  Name: Pamela Alvarez MRN: [id number] Date of Birth: May 09, 1940 Referring Provider (PT): Gaspar Bidding   Encounter Date: 02/07/2018  PT End of Session - 02/07/18 1413    Visit Number  2    Number of Visits  12    Date for PT Re-Evaluation  03/15/18    Authorization Type  U[employer]    PT Start Time  1215    PT Stop Time  1257    PT Time Calculation (min)  42 min    Activity Tolerance  Patient tolerated treatment well    Behavior During Therapy  [facility] for tasks assessed/performed;Flat affect       Past Medical History:  Diagnosis Date  . Cyst 07-2008   Cyst on back x2 that were drained  . H/O echocardiogram 02-2000  . Hx of colonic polyps   . Hyperlipidemia   . Varicose veins of both lower extremities     Past Surgical History:  Procedure Laterality Date  . POLYPECTOMY     Colon  . TONSILLECTOMY    . TUBAL LIGATION      There were no vitals filed for this visit.  Subjective Assessment - 02/07/18 1412    Subjective  Pt with no new complaints. States she went out of town over the weekend, feels increased difficulty with balance at other peoples homes.     Patient Stated Goals  decreased pain , improved balance/falls.     Currently in Pain?  Yes    Pain Score  2     Pain Location  Knee    Pain Orientation  Right    Pain Descriptors / Indicators  Aching    Pain Type  Chronic pain         [facility] PT Assessment - 02/07/18 1211      Berg Balance Test   Sit to Stand  Able to stand  independently using hands    Standing Unsupported  Able to stand safely 2 minutes    Sitting with Back Unsupported but Feet Supported on Floor or Stool  Able to sit safely and securely 2 minutes    Stand to Sit  Controls descent by using hands    Transfers  Able to transfer safely, definite need of hands    Standing  Unsupported with Eyes Closed  Able to stand 10 seconds safely    Standing Ubsupported with Feet Together  Able to place feet together independently and stand for 1 minute with supervision    From Standing, Reach Forward with Outstretched Arm  Can reach forward >12 cm safely (5")    From Standing Position, Pick up Object from Floor  Able to pick up shoe safely and easily    From Standing Position, Turn to Look Behind Over each Shoulder  Turn sideways only but maintains balance    Turn 360 Degrees  Able to turn 360 degrees safely but slowly    Standing Unsupported, Alternately Place Feet on Step/Stool  Able to complete 4 steps without aid or supervision    Standing Unsupported, One Foot in [employer] balance while stepping or standing    Standing on One Leg  Tries to lift leg/unable to hold 3 seconds but remains standing independently    Total Score  38    Berg comment:  significant fall risk  Dynamic Gait Index   Level Surface  Moderate Impairment    Change in Gait Speed  Moderate Impairment    Gait with Horizontal Head Turns  Moderate Impairment    Gait with Vertical Head Turns  Moderate Impairment    Gait and Pivot Turn  Mild Impairment    Step Over Obstacle  Moderate Impairment    Step Around Obstacles  Mild Impairment    Steps  Moderate Impairment    Total Score  10    DGI comment:  indicates fall risk                   [facility] Adult PT Treatment/Exercise - 02/07/18 1211      Ambulation/Gait   Stairs  --    Gait Comments  --      Exercises   Exercises  Knee/Hip      Knee/Hip Exercises: Stretches   Active Hamstring Stretch  3 reps;30 seconds      Knee/Hip Exercises: Standing   Hip Flexion  20 reps;3 sets;Knee bent    Hip Abduction  2 sets;10 reps;Both    Stairs  5 steps up/down 2 hand rails; x6 with Max verbal and tactile cuing for reciprocol pattern.     Gait Training  Stepping over cones 25 ft x6;       Knee/Hip Exercises: Seated   Long Arc Quad  20  reps      Knee/Hip Exercises: Supine   Quad Sets  10 reps    Straight Leg Raises  Both;20 reps               PT Short Term Goals - 02/01/18 1500      PT SHORT TERM GOAL #1   Title  independent with initial HEP    Time  2    Period  Weeks    Status  New    Target Date  02/15/18      PT SHORT TERM GOAL #2   Title  Pt to demo ability to safely navigate 3 stairs with 2 hand rails, with step over step pattern.     Time  2    Period  Weeks    Status  New    Target Date  02/15/18        PT Long Term Goals - 02/07/18 1419      PT LONG TERM GOAL #4   Title  Pt to demo improved TUG score by at least 3 seconds, to improve safety with community navigation.     Time  6    Period  Weeks    Status  New    Target Date  03/15/18      PT LONG TERM GOAL #5   Title  Pt to demo improved DGI score by at least 3 points, to improve fall risk.     Time  6    Period  Weeks    Status  New    Target Date  03/15/18            Plan - 02/07/18 1415    Clinical Impression Statement  Pt with significant difficulty following commands to "step over the cone" during balance testing and practice for balance today. She requires tactile cuing on stairs for reciprocol pattern, unable to follow verbal commands for this. She demonstrates decreased balance with formal testing today, scores indicating significant fall risk. Pt with most diffiuclty with dynamic gait, shows very little ability for change in speeds, and  has much difficulty with verbal commands for head turns. Plan to work on balance as well as R knee strengthening/pain. Balance goals added today.     Rehab Potential  Good    PT Frequency  2x / week    PT Duration  6 weeks    PT Treatment/Interventions  ADLs/Self Care Home Management;Cryotherapy;Electrical Stimulation;Iontophoresis 4mg /ml Dexamethasone;Moist Heat;Therapeutic activities;Functional mobility training;Stair training;Gait training;DME Instruction;Ultrasound;Therapeutic  exercise;Balance training;Neuromuscular re-education;Patient/family education;Dry needling;Passive range of motion;Manual techniques;Taping;Spinal Manipulations;Joint Manipulations    PT Next Visit Plan  Formal balance testing, BERG and or DGI.     Consulted and Agree with Plan of Care  Patient       Patient will benefit from skilled therapeutic intervention in order to improve the following deficits and impairments:  Abnormal gait, Decreased endurance, Pain, Decreased strength, Decreased activity tolerance, Decreased balance, Decreased mobility, Difficulty walking, Improper body mechanics, Decreased safety awareness  Visit Diagnosis: Chronic pain of right knee  Other abnormalities of gait and mobility     Problem List Patient Active Problem List   Diagnosis Date Noted  . Arthritis of carpometacarpal [facility] joint of left thumb 01/24/2018  . Ankle fracture, left 09/03/2016  . Hand dysfunction 03/11/2016  . Symptomatic varicose veins, bilateral 03/11/2016  . Numbness of left thumb 03/11/2016  . Elevated BP 07/27/2014  . Bunion of left foot 07/26/2014  . Abnormality of gait 07/26/2014  . Cough 11/10/2013  . Post-nasal drainage 11/10/2013  . Abnormal urine odor 11/10/2013  . Visit for preventive health examination 07/28/2013  . Right anterior knee pain 07/22/2012  . Medicare annual wellness visit, subsequent 07/25/2011  . ANXIETY, SITUATIONAL 09/25/2009  . ARTHRITIS 09/25/2009  . Vitamin D deficiency 09/27/2008  . Hyperlipidemia 09/17/2008  . Elevated blood pressure reading without diagnosis of hypertension 09/17/2008  . COLONIC POLYPS, HX OF 09/17/2008  . PERSONAL HISTORY DISEASES SKIN&SUBCUT TISSUE 09/17/2008   Sedalia MutaLauren Besan Ketchem, PT, DPT 2:21 PM  02/07/18     [facility] [address] [place], [place], [address] Phone: [phone]   Fax:  [phone]  Name: Lorie ApleySandra Brooke MRN: [id number] Date of Birth: 08/07/1940

## 2018-02-08 ENCOUNTER — Ambulatory Visit: Payer: Medicare Other | Admitting: Neurology

## 2018-02-08 ENCOUNTER — Telehealth: Payer: Self-pay | Admitting: Neurology

## 2018-02-08 ENCOUNTER — Encounter: Payer: Self-pay | Admitting: Neurology

## 2018-02-08 VITALS — BP 179/76 | HR 97 | Ht 62.0 in | Wt 148.0 lb

## 2018-02-08 DIAGNOSIS — G231 Progressive supranuclear ophthalmoplegia [Steele-Richardson-Olszewski]: Secondary | ICD-10-CM

## 2018-02-08 DIAGNOSIS — R4181 Age-related cognitive decline: Secondary | ICD-10-CM | POA: Diagnosis not present

## 2018-02-08 DIAGNOSIS — F0789 Other personality and behavioral disorders due to known physiological condition: Secondary | ICD-10-CM

## 2018-02-08 DIAGNOSIS — F09 Unspecified mental disorder due to known physiological condition: Secondary | ICD-10-CM | POA: Diagnosis not present

## 2018-02-08 DIAGNOSIS — R4782 Fluency disorder in conditions classified elsewhere: Secondary | ICD-10-CM | POA: Diagnosis not present

## 2018-02-08 DIAGNOSIS — H510 Palsy (spasm) of conjugate gaze: Secondary | ICD-10-CM | POA: Insufficient documentation

## 2018-02-08 NOTE — Progress Notes (Signed)
Provider:  Melvyn Novas, MD    Referring Provider: [name], MD Primary Care Physician:  [name], MD  Chief Complaint  Patient presents with  . New Patient (Initial Visit)    pt with husband, rm 10. pt presents today for second opinion on her memory issues. broke ankle 2 yrs ago and since then she went and saw Dr Fabian Sharp, had difficulty with word retrieval. refered to neurologist and he completed the test, ordered MRI and then ordered medication and support groups and sent on way. pt never completd the MRI images. she is willing to go at this time but at the time ordered they just werent comfortable with everything. still having word finding difficulty.     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 female patient, seen here on 02-08-2018 in a referral from Dr. Fabian Sharp for a "second opinion " in the work up for memory loss.  The patient reports having been stumped by the previously see neurologist in [place], who reportedly diagnosed her with Alzheimer's Dementia.  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 She has a [name] and reports she had some learning disability - not learning or memory related, but an inability to calculate.   Dr. Early Chars had always been acute aware of her learning abilities, but over the last couple of years there has been evidence of a declining cognitive ability.  The patient broke her ankle at the time and while she recovered from this supposedly only bony injury there were several changes, she felt always cold it was as if her enough thermostat had changed, she also noted and memory decline, may be a personality change, she had trouble retrieving words and her vocabulary became progressively restricted, her speech poor. She has ever since gait and balance impairment. She is in PT twice weekly.    Family history - mother had PD.father had CAD, died at 35 years old, cancer.   Social history- [name], no history of  tobacco , ETOH- used to have one glass of wine with dinner, now 2-3 days a week.  No  recreatinonal drugs. 2 sons , aged 50 and 24.    Review of Systems: Out of a complete 14 system review, the patient complains of only the following symptoms, and all other reviewed systems are negative.   Poverty of speech in a [name], stiff gait, left-right confusion acalculia.  No  Dysphagia. Mild Dysphonia. No loss of smell or taste.     Social History   Socioeconomic History  . Marital status: Married    Spouse name: Pamela Alvarez  . Number of children: 2  . Years of education: Not on file  . Highest education level: Doctorate  Occupational History  . Occupation: retired    Comment: Actor  Social Needs  . Financial resource strain: Not on file  . Food insecurity:    Worry: Not on file    Inability: Not on file  . Transportation needs:    Medical: Not on file    Non-medical: Not on file  Tobacco Use  . Smoking status: Never Smoker  . Smokeless tobacco: Never Used  Substance and Sexual Activity  . Alcohol use: Yes    Alcohol/week: 7.0 standard drinks    Types: 7 Glasses of wine per week    Comment: one a day  . Drug use: Yes  . Sexual activity: Not on file  Lifestyle  .  Physical activity:    Days per week: Not on file    Minutes per session: Not on file  . Stress: Not on file  Relationships  . Social connections:    Talks on phone: Not on file    Gets together: Not on file    Attends religious service: Not on file    Active member of club or organization: Not on file    Attends meetings of clubs or organizations: Not on file    Relationship status: Not on file  . Intimate partner violence:    Fear of current or ex partner: Not on file    Emotionally abused: Not on file    Physically abused: Not on file    Forced sexual activity: Not on file  Other Topics Concern  . Not on file  Social History Narrative   Regular exercise-yes   Moved back to [place] from [place]    Hh of 2 cat   G3 P2    Exercises walking regu;arly    Retired  Social research officer, governmentchool psychologist   Drinks one cup of coffee  A day. In addition to walking QD she and her husband take a pilates class on Thursdays. She lives with her husband in a 2 story house, though the master bedroom in on the first level.        Family History  Problem Relation Age of Onset  . Hypertension Mother   . Parkinsonism Mother   . Angina Father   . Heart disease Father   . Colon cancer Father   . Lung cancer Father   . Skin cancer Father   . Cancer Father        Heart  . Breast cancer Sister        over 8950  . Diabetes Sister   . Heart disease Brother 2055       triple bypass surgery  . Macular degeneration Sister   . Diabetes Sister        prediabetic  . Hearing loss Sister        hearing problems  . Vasculitis Sister     Past Medical History:  Diagnosis Date  . Cyst 07-2008   Cyst on back x2 that were drained  . H/O echocardiogram 02-2000  . Hx of colonic polyps   . Hyperlipidemia   . Hypertension   . Varicose veins of both lower extremities     Past Surgical History:  Procedure Laterality Date  . POLYPECTOMY     Colon  . TONSILLECTOMY    . TUBAL LIGATION      Current Outpatient Medications  Medication Sig Dispense Refill  . atorvastatin (LIPITOR) 10 MG tablet TAKE 1 TABLET BY MOUTH EVERY DAY 90 tablet 2  . cyanocobalamin 1000 MCG tablet Take 1,000 mcg by mouth daily.    . diclofenac sodium (VOLTAREN) 1 % GEL Apply topically to affected area qid 100 g 1  . losartan (COZAAR) 25 MG tablet TAKE 1 TABLET BY MOUTH EVERY DAY 90 tablet 0   No current facility-administered medications for this visit.     Allergies as of 02/08/2018 - Review Complete 02/08/2018  Allergen Reaction Noted  . Dust mite extract Cough 12/09/2015  . Sulfonamide derivatives      Vitals: BP (!) 179/76   Pulse 97   Ht 5\' 2"  (1.575 m)   Wt 148 lb (67.1 kg)   BMI 27.07 kg/m  Last Weight:  Wt Readings from Last 1  Encounters:  02/08/18 148  lb (67.1 kg)   Last Height:   Ht Readings from Last 1 Encounters:  02/08/18 5\' 2"  (1.575 m)    Physical exam:  General: The patient is awake, alert and appears not in acute distress. The patient is well groomed. Head: Normocephalic, atraumatic. Neck is supple. Cardiovascular:  Regular rate and rhythm , without  murmurs or carotid bruit, and without distended neck veins. Respiratory: Lungs are clear to auscultation. Skin:  Without evidence of edema, or rash Trunk: BMI is 27.1 kg/m2  elevated and patient  has normal posture.  Neurologic exam : The patient is awake and alert, oriented to place and time.  Memory subjective described as impaired  There is an ab- normal attention span & concentration ability. Speech is non -fluent with aphasia. Mood and affect are meek.  Cranial nerves: Pupils are equal and briskly reactive to light.  Funduscopic exam without  evidence of pallor or edema. Extraocular movements  in vertical planes restricted, neither able to pursuit upwards not downwards gaze-  and horizontal planes with sharp, coarse saccades, skipping the central vision.  without nystagmus.  Reduced, rare blink -  Visual fields by finger perimetry are restricted  Hearing to finger rub intact.  Facial sensation intact to fine touch. Facial motor strength is symmetric, but reduced facial mimic.  Tongue and uvula move midline. Tongue protrusion into either cheek is normal. Shoulder shrug is normal.   Motor exam: elevated tone, symmetric muscle bulk and symmetric strength in all extremities.  Sensory:  Fine touch, pinprick and vibration were tested in all extremities. She seems to have higher sensitivity on the left, she did not show extinction. Proprioception was normal.  Coordination: Rapid alternating movements in the fingers/hands were normal. Finger-to-nose maneuver  normal without evidence of ataxia, dysmetria or tremor.  Gait and station: Patient walks without  assistive device and is able unassisted to climb up to the exam table.  Strength within normal limits, but gait appears stiff, no rotation - Stance is stable and normal. Tandem gait was deferred.  Romberg testing is negative   Deep tendon reflexes: in the upper and lower extremities are symmetric and intact. Babinski maneuver response is  downgoing.  Assessment:  After physical and neurologic examination, review of laboratory studies, imaging, neurophysiology testing and pre-existing records, assessment is that of :   Neurodegenerative disease precess with supranuclear palsy, increased muscle tone, rigidity and propulsive fall risk.   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   Lost use of her left thumb- no trauma.    MMSE - Mini Mental State Exam 02/08/2018  Orientation to time 1  Orientation to Place 5  Registration 3  Attention/ Calculation 0  Recall 1  Language- name 2 objects 2  Language- repeat 0  Language- follow 3 step command 3  Language- read & follow direction 1  Write a sentence 0  Copy design 0  Total score 16     Plan:  Treatment plan and additional workup :  I will need an MRI brain- with and without contrast.  Consider DAT scan if MRI brain negative.   Neuro-ophthalmologist considered, patient has an OD.  EEG -  Rv in 2 month with me.    Porfirio Mylar Rishi Vicario MD 02/08/2018

## 2018-02-08 NOTE — Patient Instructions (Signed)
Progressive Supranuclear Palsy Progressive supranuclear palsy is a rare brain disorder that causes a gradual deterioration of cells at the base of the brain. It causes serious problems with walking, eye movement, and balance. It may also cause behavior changes, depression, and loss of mental capacity (dementia). Progressive supranuclear palsy tends to get worse over time. What are the causes? The exact cause of progressive supranuclear palsy is not known. In some cases, the condition may be caused by genes passed down through families (inherited). What increases the risk? Progressive supranuclear palsy usually begins after age 60 and is slightly more common in men. What are the signs or symptoms? The pattern of signs and symptoms can vary greatly from person to person. Signs and symptoms may include:  Loss of balance while walking.  Walking that is stiff and awkward.  Unexplained falls.  Inability to focus the eyes properly or blurred vision.  Changes in mood and behavior. These include: ? Lack of feeling or emotion (apathy). ? Irritability. ? Sudden laughing or crying.  Personality changes.  Progressive mild dementia.  Difficulty swallowing and speaking.  How is this diagnosed? Your health care provider can diagnose progressive supranuclear palsy from your signs and symptoms. The main symptoms your health care provider will check for are:  Poor balance.  Vision problems.  Slurred speech.  Mental or behavioral changes.  Your health care provider may also do some tests to rule out other causes of your symptoms. This may include checking your spinal fluid for abnormal proteins and taking images of your brain to look for areas of nerve cell loss. How is this treated? There is no cure for progressive supranuclear palsy. No treatment will slow the progression of the disease. Your treatment will be based on your symptoms. Treatment may include:  Medicines for Parkinson's  disease. These may help with slowness, stiffness, and balance problems.  Antidepressant medicines to treat depression.  Glasses called prisms to help with blurry vision.  Walking aids to prevent falls.  A surgical procedure to place a feeding tube into your stomach (gastrostomy) if swallowing becomes very hard.  Follow these instructions at home: Work closely with your team of health care providers. Your team may include:  A physical therapist. Have him or her help you come up with a safe exercise program.  A speech and language therapist. Have him or her teach you ways to swallow foods and liquids safely. This specialist can also help you speak more clearly.  An occupational therapist. Have him or her help you learn to use walking aids and make your home safe.  Contact a health care provider if:  You have chills or fever.  Your symptoms are getting worse.  You develop new symptoms.  You are having trouble getting enough nutrition.  You have a cough that will not go away.  You have shortness of breath.  You are anxious or depressed.  You are not getting enough support at home. Get help right away if:  You choke, cough, or have trouble breathing after eating or drinking.  You hurt yourself in a fall.  You no longer feel safe at home. This information is not intended to replace advice given to you by your health care provider. Make sure you discuss any questions you have with your health care provider. Document Released: 02/13/2002 Document Revised: 08/01/2015 Document Reviewed: 02/22/2013 Elsevier Interactive Patient Education  2018 [employer]  

## 2018-02-08 NOTE — Telephone Encounter (Signed)
[employer] medicare order sent to GI. No auth they will reach out to the pt to schedule.  °

## 2018-02-09 ENCOUNTER — Encounter: Payer: Medicare Other | Admitting: Physical Therapy

## 2018-02-16 ENCOUNTER — Ambulatory Visit: Payer: Medicare Other | Admitting: Physical Therapy

## 2018-02-16 ENCOUNTER — Encounter: Payer: Self-pay | Admitting: Physical Therapy

## 2018-02-16 DIAGNOSIS — R2689 Other abnormalities of gait and mobility: Secondary | ICD-10-CM

## 2018-02-16 DIAGNOSIS — M25561 Pain in right knee: Secondary | ICD-10-CM

## 2018-02-16 DIAGNOSIS — G8929 Other chronic pain: Secondary | ICD-10-CM

## 2018-02-16 NOTE — Therapy (Signed)
[facility] [address] [place], [place], [address] Phone: [phone]   Fax:  [phone]  Physical Therapy Treatment  Patient Details  Name: Sparkles Mcneely MRN: [id number] Date of Birth: 02/12/1941 Referring Provider (PT): Gaspar Bidding   Encounter Date: 02/16/2018  PT End of Session - 02/16/18 1509    Visit Number  3    Number of Visits  12    Date for PT Re-Evaluation  03/15/18    Authorization Type  UHC    PT Start Time  1425    PT Stop Time  1512    PT Time Calculation (min)  47 min    Activity Tolerance  Patient tolerated treatment well    Behavior During Therapy  [facility] for tasks assessed/performed;Flat affect       Past Medical History:  Diagnosis Date  . Cyst 07-2008   Cyst on back x2 that were drained  . H/O echocardiogram 02-2000  . Hx of colonic polyps   . Hyperlipidemia   . Hypertension   . Varicose veins of both lower extremities     Past Surgical History:  Procedure Laterality Date  . POLYPECTOMY     Colon  . TONSILLECTOMY    . TUBAL LIGATION      There were no vitals filed for this visit.  Subjective Assessment - 02/16/18 1508    Subjective  Pt with no new complaints. She reports no falls.     Currently in Pain?  Yes    Pain Score  6     Pain Location  Knee    Pain Orientation  Right    Pain Descriptors / Indicators  Aching    Pain Type  Chronic pain    Pain Onset  More than a month ago    Pain Frequency  Intermittent    Pain Relieving Factors  "if i leave it alone, it disappears"                        [facility] Adult PT Treatment/Exercise - 02/16/18 1422      Exercises   Exercises  Knee/Hip      Knee/Hip Exercises: Stretches   Active Hamstring Stretch  3 reps;30 seconds      Knee/Hip Exercises: Aerobic   Stationary Bike  L1 x6 min      Knee/Hip Exercises: Standing   Heel Raises  15 reps    Hip Flexion  20 reps;Knee bent    Hip Abduction  2 sets;10 reps;Both    Forward  Step Up  10 reps;Step Height: 6";Hand Hold: 1    Forward Step Up Limitations  Education on mechanics    Stairs  5 steps up/down 2 hand rails; x26 with Max verbal and tactile cuing for reciprocol pattern.     Gait Training  Stepping over objects 25 ft x6;  Ambulation, with tactile cuing for increasing speed 35 ft x8  (x2)     Other Standing Knee Exercises  Tandem stance 30 sec x2 bil; rhomberg stance with head turns x20;       Knee/Hip Exercises: Seated   Long Arc Quad  20 reps      Knee/Hip Exercises: Supine   Quad Sets  --    Straight Leg Raises  --               PT Short Term Goals - 02/01/18 1500      PT SHORT TERM GOAL #1  Title  independent with initial HEP    Time  2    Period  Weeks    Status  New    Target Date  02/15/18      PT SHORT TERM GOAL #2   Title  Pt to demo ability to safely navigate 3 stairs with 2 hand rails, with step over step pattern.     Time  2    Period  Weeks    Status  New    Target Date  02/15/18        PT Long Term Goals - 02/07/18 1419      PT LONG TERM GOAL #4   Title  Pt to demo improved TUG score by at least 3 seconds, to improve safety with community navigation.     Time  6    Period  Weeks    Status  New    Target Date  03/15/18      PT LONG TERM GOAL #5   Title  Pt to demo improved DGI score by at least 3 points, to improve fall risk.     Time  6    Period  Weeks    Status  New    Target Date  03/15/18            Plan - 02/16/18 1510    Clinical Impression Statement  Pt with good ability for static balance today, she is most challenged with dynamic activity. She is unable to follow commands to perform reciprocol pattern on stairs, although likely able to as far as her strength is concerned. Pt cued for increasing anterior weight shift with step ups to decrease use of UEs. She has very slow gait speed, verbal and tactile cuing used with gait training today to increase speed. Pt with difficulty mantaining increased  speed. Also cued for posture with head and shoulders. Plan to progress gait, balance,  and LE strength as tolerated.      Rehab Potential  Good    PT Frequency  2x / week    PT Duration  6 weeks    PT Treatment/Interventions  ADLs/Self Care Home Management;Cryotherapy;Electrical Stimulation;Iontophoresis 4mg /ml Dexamethasone;Moist Heat;Therapeutic activities;Functional mobility training;Stair training;Gait training;DME Instruction;Ultrasound;Therapeutic exercise;Balance training;Neuromuscular re-education;Patient/family education;Dry needling;Passive range of motion;Manual techniques;Taping;Spinal Manipulations;Joint Manipulations    PT Next Visit Plan  Formal balance testing, BERG and or DGI.     Consulted and Agree with Plan of Care  Patient       Patient will benefit from skilled therapeutic intervention in order to improve the following deficits and impairments:  Abnormal gait, Decreased endurance, Pain, Decreased strength, Decreased activity tolerance, Decreased balance, Decreased mobility, Difficulty walking, Improper body mechanics, Decreased safety awareness  Visit Diagnosis: Chronic pain of right knee  Other abnormalities of gait and mobility     Problem List Patient Active Problem List   Diagnosis Date Noted  . Fluency disorder associated with underlying disease 02/08/2018  . Cognitive and neurobehavioral dysfunction 02/08/2018  . 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 Arthritis of carpometacarpal [facility] joint of left thumb 01/24/2018  . Ankle fracture, left 09/03/2016  . Hand dysfunction 03/11/2016  . Symptomatic varicose veins, bilateral 03/11/2016  . Numbness of left thumb 03/11/2016  . Elevated BP 07/27/2014  . Bunion of left foot 07/26/2014  . Abnormality of gait 07/26/2014  . Cough 11/10/2013  . Post-nasal drainage 11/10/2013  . Abnormal urine odor 11/10/2013  . Visit for preventive health examination 07/28/2013  . Right anterior knee pain 07/22/2012  .  Medicare  annual wellness visit, subsequent 07/25/2011  . ANXIETY, SITUATIONAL 09/25/2009  . ARTHRITIS 09/25/2009  . Vitamin D deficiency 09/27/2008  . Hyperlipidemia 09/17/2008  . Elevated blood pressure reading without diagnosis of hypertension 09/17/2008  . COLONIC POLYPS, HX OF 09/17/2008  . PERSONAL HISTORY DISEASES SKIN&SUBCUT TISSUE 09/17/2008    Sedalia Muta, PT, DPT 3:14 PM  02/16/18    [facility] [address] [place], [place], [address] Phone: [phone]   Fax:  [phone]  Name: Kameshia Madruga MRN: [id number] Date of Birth: 1940/09/14

## 2018-02-21 ENCOUNTER — Encounter: Payer: Self-pay | Admitting: Physical Therapy

## 2018-02-21 ENCOUNTER — Ambulatory Visit: Payer: Medicare Other | Admitting: Physical Therapy

## 2018-02-21 ENCOUNTER — Other Ambulatory Visit: Payer: Medicare Other

## 2018-02-21 DIAGNOSIS — G8929 Other chronic pain: Secondary | ICD-10-CM | POA: Diagnosis not present

## 2018-02-21 DIAGNOSIS — R2689 Other abnormalities of gait and mobility: Secondary | ICD-10-CM | POA: Diagnosis not present

## 2018-02-21 DIAGNOSIS — M25561 Pain in right knee: Secondary | ICD-10-CM | POA: Diagnosis not present

## 2018-02-21 NOTE — Therapy (Signed)
[facility] [address] [place], [place], [address] Phone: [phone]   Fax:  [phone]  Physical Therapy Treatment  Patient Details  Name: Pamela Alvarez MRN: [id number] Date of Birth: 09-16-1940 Referring Provider (PT): Gaspar Bidding   Encounter Date: 02/21/2018  PT End of Session - 02/21/18 1117    Visit Number  4    Number of Visits  12    Date for PT Re-Evaluation  03/15/18    Authorization Type  UHC    PT Start Time  1020    PT Stop Time  1100    PT Time Calculation (min)  40 min    Activity Tolerance  Patient tolerated treatment well    Behavior During Therapy  [facility] for tasks assessed/performed;Flat affect       Past Medical History:  Diagnosis Date  . Cyst 07-2008   Cyst on back x2 that were drained  . H/O echocardiogram 02-2000  . Hx of colonic polyps   . Hyperlipidemia   . Hypertension   . Varicose veins of both lower extremities     Past Surgical History:  Procedure Laterality Date  . POLYPECTOMY     Colon  . TONSILLECTOMY    . TUBAL LIGATION      There were no vitals filed for this visit.  Subjective Assessment - 02/21/18 1116    Subjective  Pt with no new complaints. She states that she still feels fearful of falling when she is walking.     Currently in Pain?  No/denies    Pain Score  0-No pain                       [facility] Adult PT Treatment/Exercise - 02/21/18 1023      Exercises   Exercises  Knee/Hip      Knee/Hip Exercises: Stretches   Active Hamstring Stretch  --      Knee/Hip Exercises: Aerobic   Stationary Bike  L1 x7 min      Knee/Hip Exercises: Standing   Heel Raises  20 reps    Hip Flexion  20 reps;Knee bent    Hip Abduction  2 sets;10 reps;Both    Forward Step Up  --    Forward Step Up Limitations  --    Stairs  5 steps up/down 2 hand rails; x26 with Max verbal and tactile cuing for reciprocol pattern.     Gait Training   Ambulation, with tactile cuing for  increasing speed 35 ft x8 , then x4;      Other Standing Knee Exercises  --    Other Standing Knee Exercises  Walk/March 35 ft x4;       Knee/Hip Exercises: Seated   Long Arc Quad  --             PT Education - 02/21/18 1116    Education Details  Education on safety with ambulation    Person(s) Educated  Patient    Methods  Explanation    Comprehension  Verbalized understanding       PT Short Term Goals - 02/01/18 1500      PT SHORT TERM GOAL #1   Title  independent with initial HEP    Time  2    Period  Weeks    Status  New    Target Date  02/15/18      PT SHORT TERM GOAL #2   Title  Pt to demo ability to  safely navigate 3 stairs with 2 hand rails, with step over step pattern.     Time  2    Period  Weeks    Status  New    Target Date  02/15/18        PT Long Term Goals - 02/07/18 1419      PT LONG TERM GOAL #4   Title  Pt to demo improved TUG score by at least 3 seconds, to improve safety with community navigation.     Time  6    Period  Weeks    Status  New    Target Date  03/15/18      PT LONG TERM GOAL #5   Title  Pt to demo improved DGI score by at least 3 points, to improve fall risk.     Time  6    Period  Weeks    Status  New    Target Date  03/15/18            Plan - 02/21/18 1118    Clinical Impression Statement  Pt requires max verbal cuing for all activities. She has much difficulty with sequencing L and R. She has been able to increase walking speed as well as arm swing, with cuing and practice. She will benefit from continued practice with this, to improve consistency.     Rehab Potential  Good    PT Frequency  2x / week    PT Duration  6 weeks    PT Treatment/Interventions  ADLs/Self Care Home Management;Cryotherapy;Electrical Stimulation;Iontophoresis 4mg /ml Dexamethasone;Moist Heat;Therapeutic activities;Functional mobility training;Stair training;Gait training;DME Instruction;Ultrasound;Therapeutic exercise;Balance  training;Neuromuscular re-education;Patient/family education;Dry needling;Passive range of motion;Manual techniques;Taping;Spinal Manipulations;Joint Manipulations    PT Next Visit Plan  Formal balance testing, BERG and or DGI.     Consulted and Agree with Plan of Care  Patient       Patient will benefit from skilled therapeutic intervention in order to improve the following deficits and impairments:  Abnormal gait, Decreased endurance, Pain, Decreased strength, Decreased activity tolerance, Decreased balance, Decreased mobility, Difficulty walking, Improper body mechanics, Decreased safety awareness  Visit Diagnosis: Chronic pain of right knee  Other abnormalities of gait and mobility     Problem List Patient Active Problem List   Diagnosis Date Noted  . Fluency disorder associated with underlying disease 02/08/2018  . Cognitive and neurobehavioral dysfunction 02/08/2018  . 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 Arthritis of carpometacarpal [facility] joint of left thumb 01/24/2018  . Ankle fracture, left 09/03/2016  . Hand dysfunction 03/11/2016  . Symptomatic varicose veins, bilateral 03/11/2016  . Numbness of left thumb 03/11/2016  . Elevated BP 07/27/2014  . Bunion of left foot 07/26/2014  . Abnormality of gait 07/26/2014  . Cough 11/10/2013  . Post-nasal drainage 11/10/2013  . Abnormal urine odor 11/10/2013  . Visit for preventive health examination 07/28/2013  . Right anterior knee pain 07/22/2012  . Medicare annual wellness visit, subsequent 07/25/2011  . ANXIETY, SITUATIONAL 09/25/2009  . ARTHRITIS 09/25/2009  . Vitamin D deficiency 09/27/2008  . Hyperlipidemia 09/17/2008  . Elevated blood pressure reading without diagnosis of hypertension 09/17/2008  . COLONIC POLYPS, HX OF 09/17/2008  . PERSONAL HISTORY DISEASES SKIN&SUBCUT TISSUE 09/17/2008    Sedalia MutaLauren Nyla Creason, PT, DPT 11:20 AM  02/21/18    [facility] [address] [place], [place], [address] Phone: [phone]   Fax:  [phone]  Name: Pamela Alvarez MRN: [id number] Date of Birth: 03/05/1941

## 2018-02-23 ENCOUNTER — Encounter: Payer: Medicare Other | Admitting: Physical Therapy

## 2018-02-25 ENCOUNTER — Ambulatory Visit: Payer: Medicare Other | Admitting: Physical Therapy

## 2018-02-25 ENCOUNTER — Encounter: Payer: Self-pay | Admitting: Physical Therapy

## 2018-02-25 DIAGNOSIS — M25561 Pain in right knee: Secondary | ICD-10-CM

## 2018-02-25 DIAGNOSIS — G8929 Other chronic pain: Secondary | ICD-10-CM

## 2018-02-25 DIAGNOSIS — R2689 Other abnormalities of gait and mobility: Secondary | ICD-10-CM | POA: Diagnosis not present

## 2018-02-25 NOTE — Therapy (Signed)
[facility] [address] [place], [place], [address] Phone: [phone]   Fax:  [phone]  Physical Therapy Treatment  Patient Details  Name: Pamela ApleySandra Simmers MRN: [id number] Date of Birth: 07/08/1940 Referring Provider (PT): Gaspar BiddingMichael Rigby   Encounter Date: 02/25/2018  PT End of Session - 02/25/18 1158    Visit Number  5    Number of Visits  12    Date for PT Re-Evaluation  03/15/18    Authorization Type  UHC    PT Start Time  1106    PT Stop Time  1148    PT Time Calculation (min)  42 min    Activity Tolerance  Patient tolerated treatment well    Behavior During Therapy  [facility] for tasks assessed/performed;Flat affect       Past Medical History:  Diagnosis Date  . Cyst 07-2008   Cyst on back x2 that were drained  . H/O echocardiogram 02-2000  . Hx of colonic polyps   . Hyperlipidemia   . Hypertension   . Varicose veins of both lower extremities     Past Surgical History:  Procedure Laterality Date  . POLYPECTOMY     Colon  . TONSILLECTOMY    . TUBAL LIGATION      There were no vitals filed for this visit.  Subjective Assessment - 02/25/18 1156    Subjective  Pt with no new complaints. Husband present for session today.     Currently in Pain?  No/denies    Pain Score  0-No pain                       [facility] Adult PT Treatment/Exercise - 02/25/18 1126      Exercises   Exercises  Knee/Hip      Knee/Hip Exercises: Aerobic   Stationary Bike  L1 x7 min    Other Aerobic  Walk x5 min in clinic without stopping/rest break      Knee/Hip Exercises: Standing   Heel Raises  20 reps    Hip Flexion  20 reps;Knee bent    Hip Abduction  2 sets;10 reps;Both    Forward Step Up  10 reps;Step Height: 6";Hand Hold: 1    Stairs  5 steps up/down 2 hand rails; x5     Gait Training   Ambulation, with tactile cuing for increasing speed 35 ft x6    Other Standing Knee Exercises  Tandem stance 30 sec x2 bil, no UE support;      Other Standing Knee Exercises  Walk/March 10  ft x4;              PT Education - 02/25/18 1157    Education Details  Increasing walking frequency, starting with 5 min, indoors or outdoors, with husband.  Safety on stairs    Person(s) Educated  Patient;Spouse    Methods  Explanation    Comprehension  Verbalized understanding       PT Short Term Goals - 02/01/18 1500      PT SHORT TERM GOAL #1   Title  independent with initial HEP    Time  2    Period  Weeks    Status  New    Target Date  02/15/18      PT SHORT TERM GOAL #2   Title  Pt to demo ability to safely navigate 3 stairs with 2 hand rails, with step over step pattern.     Time  2  Period  Weeks    Status  New    Target Date  02/15/18        PT Long Term Goals - 02/07/18 1419      PT LONG TERM GOAL #4   Title  Pt to demo improved TUG score by at least 3 seconds, to improve safety with community navigation.     Time  6    Period  Weeks    Status  New    Target Date  03/15/18      PT LONG TERM GOAL #5   Title  Pt to demo improved DGI score by at least 3 points, to improve fall risk.     Time  6    Period  Weeks    Status  New    Target Date  03/15/18            Plan - 02/25/18 1159    Clinical Impression Statement  Pt able to walk for 5 min today, cued for increasing speed during walking. Recommended pt start walking in home or outdoors with husband. Pt hesitant for faster speed. Pt improving with ability for LE ther ex. Pt unable to attend next week due to holiday. Plan to progress walking endurance and balance as pt tolerates.     Rehab Potential  Good    PT Frequency  2x / week    PT Duration  6 weeks    PT Treatment/Interventions  ADLs/Self Care Home Management;Cryotherapy;Electrical Stimulation;Iontophoresis 4mg /ml Dexamethasone;Moist Heat;Therapeutic activities;Functional mobility training;Stair training;Gait training;DME Instruction;Ultrasound;Therapeutic exercise;Balance  training;Neuromuscular re-education;Patient/family education;Dry needling;Passive range of motion;Manual techniques;Taping;Spinal Manipulations;Joint Manipulations    PT Next Visit Plan  Formal balance testing, BERG and or DGI.     Consulted and Agree with Plan of Care  Patient       Patient will benefit from skilled therapeutic intervention in order to improve the following deficits and impairments:  Abnormal gait, Decreased endurance, Pain, Decreased strength, Decreased activity tolerance, Decreased balance, Decreased mobility, Difficulty walking, Improper body mechanics, Decreased safety awareness  Visit Diagnosis: Chronic pain of right knee  Other abnormalities of gait and mobility     Problem List Patient Active Problem List   Diagnosis Date Noted  . Fluency disorder associated with underlying disease 02/08/2018  . Cognitive and neurobehavioral dysfunction 02/08/2018  . 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 Arthritis of carpometacarpal [facility] joint of left thumb 01/24/2018  . Ankle fracture, left 09/03/2016  . Hand dysfunction 03/11/2016  . Symptomatic varicose veins, bilateral 03/11/2016  . Numbness of left thumb 03/11/2016  . Elevated BP 07/27/2014  . Bunion of left foot 07/26/2014  . Abnormality of gait 07/26/2014  . Cough 11/10/2013  . Post-nasal drainage 11/10/2013  . Abnormal urine odor 11/10/2013  . Visit for preventive health examination 07/28/2013  . Right anterior knee pain 07/22/2012  . Medicare annual wellness visit, subsequent 07/25/2011  . ANXIETY, SITUATIONAL 09/25/2009  . ARTHRITIS 09/25/2009  . Vitamin D deficiency 09/27/2008  . Hyperlipidemia 09/17/2008  . Elevated blood pressure reading without diagnosis of hypertension 09/17/2008  . COLONIC POLYPS, HX OF 09/17/2008  . PERSONAL HISTORY DISEASES SKIN&SUBCUT TISSUE 09/17/2008    Pamela Alvarez, PT, DPT 12:01 PM  02/25/18    [facility] [address] [place], [place], [address] Phone: [phone]   Fax:  [phone]  Name: Pamela ApleySandra Birenbaum MRN: [id number] Date of Birth: 01/30/1941

## 2018-03-07 ENCOUNTER — Encounter: Payer: Medicare Other | Admitting: Physical Therapy

## 2018-03-10 ENCOUNTER — Ambulatory Visit: Payer: Medicare Other | Admitting: Physical Therapy

## 2018-03-10 ENCOUNTER — Encounter: Payer: Self-pay | Admitting: Physical Therapy

## 2018-03-10 DIAGNOSIS — M25561 Pain in right knee: Secondary | ICD-10-CM | POA: Diagnosis not present

## 2018-03-10 DIAGNOSIS — R2689 Other abnormalities of gait and mobility: Secondary | ICD-10-CM | POA: Diagnosis not present

## 2018-03-10 DIAGNOSIS — G8929 Other chronic pain: Secondary | ICD-10-CM | POA: Diagnosis not present

## 2018-03-10 NOTE — Therapy (Signed)
[facility] [address] [place], [place], [address] Phone: [phone]   Fax:  [phone]  Physical Therapy Treatment/Re-Cert   Patient Details  Name: Pamela Alvarez MRN: [id number] Date of Birth: 1940-08-20 Referring Provider (PT): Teresa Coombs   Encounter Date: 03/10/2018  PT End of Session - 03/10/18 1426    Visit Number  6    Number of Visits  20    Date for PT Re-Evaluation  04/07/18    Authorization Type  [employer]    PT Start Time  8343    PT Stop Time  1426    PT Time Calculation (min)  38 min    Activity Tolerance  Patient tolerated treatment well    Behavior During Therapy  [facility] for tasks assessed/performed;Flat affect       Past Medical History:  Diagnosis Date  . Cyst 07-2008   Cyst on back x2 that were drained  . H/O echocardiogram 02-2000  . Hx of colonic polyps   . Hyperlipidemia   . Hypertension   . Varicose veins of both lower extremities     Past Surgical History:  Procedure Laterality Date  . POLYPECTOMY     Colon  . TONSILLECTOMY    . TUBAL LIGATION      There were no vitals filed for this visit.  Subjective Assessment - 03/10/18 1359    Subjective  Pt accompanied by husband, states fall this am, does have redness mild bruising on forehead and nose. Husband was able to get her up, states no issues, heachache, dizziness, loss of consciousness, etc. Pt states that she feels "fine now". Denies any symptoms at this time.  BP stable during session., 142/74.     Currently in Pain?  No/denies    Pain Score  0-No pain         [facility] PT Assessment - 03/10/18 0001      Strength   Overall Strength Comments  hips: 4/5    Right Knee Flexion  4+/5    Right Knee Extension  4+/5      Ambulation/Gait   Gait Comments  Tandem stance: 10 sec bilaterally                   [facility] Adult PT Treatment/Exercise - 03/10/18 1400      Exercises   Exercises  Knee/Hip      Knee/Hip Exercises: Aerobic    Stationary Bike  --    Other Aerobic  ambulation 35 ft x10;       Knee/Hip Exercises: Standing   Heel Raises  20 reps    Hip Flexion  20 reps;Knee bent    Hip Abduction  2 sets;10 reps;Both    Forward Step Up  --    Stairs  5 steps up/down 2 hand rails; x6     Gait Training  --    Other Standing Knee Exercises  Tandem stance 30 sec x2 bil, no UE support;     Other Standing Knee Exercises  --             PT Education - 03/10/18 1449    Education Details  Education with pt and husband on post fall safety, symptoms to watch for, and when to contact dr. , Discussed continued POC.     Person(s) Educated  Patient;Spouse    Methods  Explanation    Comprehension  Verbalized understanding       PT Short Term Goals - 03/10/18 1451  PT SHORT TERM GOAL #1   Title  independent with initial HEP    Time  2    Period  Weeks    Status  Achieved      PT SHORT TERM GOAL #2   Title  Pt to demo ability to safely navigate 3 stairs with 2 hand rails, (step-to pattern)    Time  2    Period  Weeks    Status  Revised    Target Date  03/24/18        PT Long Term Goals - 03/10/18 1452      PT LONG TERM GOAL #1   Title  independent with long term HEP    Time  4    Period  Weeks    Status  On-going    Target Date  04/07/18      PT LONG TERM GOAL #2   Title  Pt to demo ability for safe navigation of at least 5 stairs, with step-to pattern), with 1 hand rail, to improve safety at home.     Time  4    Period  Weeks    Status  Revised    Target Date  04/07/18      PT LONG TERM GOAL #3   Title  Pt to report decreased pain in R knee, to 0-2/10 with walking and stairs, to improve ability for community activities.     Time  4    Period  Weeks    Status  Partially Met    Target Date  04/07/18      PT LONG TERM GOAL #4   Title  Pt to demo ability for Tandem stance for 30 sec     Time  4    Period  Weeks    Status  New    Target Date  04/07/18      PT LONG TERM GOAL #5    Title  Pt to demo improved TUG score to increase by at least 3 seconds, to improve gait efficiency and speed.     Time  4    Period  Weeks    Target Date  04/07/18            Plan - 03/10/18 1501    Clinical Impression Statement  Pt with no symptoms today post fall. MD contacted to notify of fall, if MD office would like to follow up with pt. Discussed reasons to call Dr. with husband today, he states understanding. Pt is improving with ability for ther ex, and for ambulation endurance. She continues to require cuing for increasing speed and distance. Pt fearful of falling, but does not demonstrate severe balance deficits. She has much difficulty following directions for hand holding and LE sequencing on stairs, and with balance exercises. Pt will benefit from continued care, to improve gait speed, efficiency, balance, LE strength, and safety with mobility. Plan to work toward d/c at end of this POC.     Rehab Potential  Good    PT Frequency  2x / week    PT Duration  4 weeks    PT Treatment/Interventions  ADLs/Self Care Home Management;Cryotherapy;Electrical Stimulation;Iontophoresis 7m/ml Dexamethasone;Moist Heat;Therapeutic activities;Functional mobility training;Stair training;Gait training;DME Instruction;Ultrasound;Therapeutic exercise;Balance training;Neuromuscular re-education;Patient/family education;Dry needling;Passive range of motion;Manual techniques;Taping;Spinal Manipulations;Joint Manipulations    Consulted and Agree with Plan of Care  Patient       Patient will benefit from skilled therapeutic intervention in order to improve the following deficits and impairments:  Abnormal gait,  Decreased endurance, Pain, Decreased strength, Decreased activity tolerance, Decreased balance, Decreased mobility, Difficulty walking, Improper body mechanics, Decreased safety awareness  Visit Diagnosis: Chronic pain of right knee  Other abnormalities of gait and mobility     Problem  List Patient Active Problem List   Diagnosis Date Noted  . Fluency disorder associated with underlying disease 02/08/2018  . Cognitive and neurobehavioral dysfunction 02/08/2018  . 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 Arthritis of carpometacarpal [facility]) joint of left thumb 01/24/2018  . Ankle fracture, left 09/03/2016  . Hand dysfunction 03/11/2016  . Symptomatic varicose veins, bilateral 03/11/2016  . Numbness of left thumb 03/11/2016  . Elevated BP 07/27/2014  . Bunion of left foot 07/26/2014  . Abnormality of gait 07/26/2014  . Cough 11/10/2013  . Post-nasal drainage 11/10/2013  . Abnormal urine odor 11/10/2013  . Visit for preventive health examination 07/28/2013  . Right anterior knee pain 07/22/2012  . Medicare annual wellness visit, subsequent 07/25/2011  . ANXIETY, SITUATIONAL 09/25/2009  . ARTHRITIS 09/25/2009  . Vitamin D deficiency 09/27/2008  . Hyperlipidemia 09/17/2008  . Elevated blood pressure reading without diagnosis of hypertension 09/17/2008  . COLONIC POLYPS, HX OF 09/17/2008  . PERSONAL HISTORY DISEASES SKIN&SUBCUT TISSUE 09/17/2008    Lyndee Hensen, PT, DPT 3:05 PM  03/10/18    [facility] [place], [place], [address] Phone: [phone]   Fax:  [phone]  Name: Marliyah Reid MRN: [id number] Date of Birth: 1940/06/29

## 2018-03-14 ENCOUNTER — Ambulatory Visit: Payer: Medicare Other | Admitting: Physical Therapy

## 2018-03-14 ENCOUNTER — Encounter: Payer: Medicare Other | Admitting: Physical Therapy

## 2018-03-14 ENCOUNTER — Ambulatory Visit: Payer: Medicare Other | Admitting: Sports Medicine

## 2018-03-14 ENCOUNTER — Encounter: Payer: Self-pay | Admitting: Sports Medicine

## 2018-03-14 ENCOUNTER — Encounter: Payer: Self-pay | Admitting: Physical Therapy

## 2018-03-14 VITALS — BP 150/80 | HR 89 | Ht 62.0 in | Wt 149.8 lb

## 2018-03-14 DIAGNOSIS — G8929 Other chronic pain: Secondary | ICD-10-CM

## 2018-03-14 DIAGNOSIS — R269 Unspecified abnormalities of gait and mobility: Secondary | ICD-10-CM | POA: Diagnosis not present

## 2018-03-14 DIAGNOSIS — R262 Difficulty in walking, not elsewhere classified: Secondary | ICD-10-CM | POA: Diagnosis not present

## 2018-03-14 DIAGNOSIS — R2689 Other abnormalities of gait and mobility: Secondary | ICD-10-CM | POA: Diagnosis not present

## 2018-03-14 DIAGNOSIS — M25561 Pain in right knee: Secondary | ICD-10-CM

## 2018-03-14 NOTE — Therapy (Signed)
[facility] [address] [place], [place], [address] Phone: [phone]   Fax:  [phone]  Physical Therapy Treatment  Patient Details  Name: Pamela Alvarez MRN: [id number] Date of Birth: [birth year] Referring Provider (PT): Teresa Coombs   Encounter Date: 03/14/2018  PT End of Session - 03/14/18 8786    Visit Number  7    Number of Visits  20    Date for PT Re-Evaluation  04/07/18    Authorization Type  UHC    PT Start Time  1430    PT Stop Time  1514    PT Time Calculation (min)  44 min    Activity Tolerance  Patient tolerated treatment well    Behavior During Therapy  [facility] for tasks assessed/performed;Flat affect       Past Medical History:  Diagnosis Date  . Cyst 07-2008   Cyst on back x2 that were drained  . H/O echocardiogram 02-2000  . Hx of colonic polyps   . Hyperlipidemia   . Hypertension   . Varicose veins of both lower extremities     Past Surgical History:  Procedure Laterality Date  . POLYPECTOMY     Colon  . TONSILLECTOMY    . TUBAL LIGATION      There were no vitals filed for this visit.  Subjective Assessment - 03/14/18 1514    Subjective  Pt /husband state no new complaints today. Bruise on forehead nearly gone. Pt states no residual problems from fall. She is having brain MRI tomorrow. She states that knee pain is much improved, and has not noticed it much lately.     Currently in Pain?  No/denies    Pain Score  0-No pain         [facility] PT Assessment - 03/14/18 0001      Dynamic Gait Index   Level Surface  Mild Impairment    Change in Gait Speed  Moderate Impairment    Gait with Horizontal Head Turns  Severe Impairment    Gait with Vertical Head Turns  Moderate Impairment    Gait and Pivot Turn  Mild Impairment    Step Over Obstacle  Moderate Impairment    Step Around Obstacles  Mild Impairment    Steps  Moderate Impairment    Total Score  10    DGI comment:  Pt with great difficulty  following commands to test, unable to follow directions to look L, R, or for speed. * not accurate score for fall prediction.       Timed Up and Go Test   Normal TUG (seconds)  22.9    TUG Comments  Increased time since last tested; After practice for increasing walking speed: 1 time test: 20.09 sec;                    [facility] Adult PT Treatment/Exercise - 03/14/18 1508      Exercises   Exercises  Knee/Hip      Knee/Hip Exercises: Aerobic   Stationary Bike  L1 x 7 min    Other Aerobic  ambulation 35 ft x10 with tactile cuing for increased speed;       Knee/Hip Exercises: Standing   Heel Raises  20 reps    Hip Flexion  20 reps;Knee bent    Hip Abduction  2 sets;10 reps;Both    Stairs  5 steps up/down 2 hand rails; x6     Other Standing Knee Exercises  Tandem stance 30  sec x2 bil, no UE support;              PT Education - 03/14/18 1613    Education Details  Recommended that pt bring walker to talk to next visit.        PT Short Term Goals - 03/10/18 1451      PT SHORT TERM GOAL #1   Title  independent with initial HEP    Time  2    Period  Weeks    Status  Achieved      PT SHORT TERM GOAL #2   Title  Pt to demo ability to safely navigate 3 stairs with 2 hand rails, (step-to pattern)    Time  2    Period  Weeks    Status  Revised    Target Date  03/24/18        PT Long Term Goals - 03/10/18 1452      PT LONG TERM GOAL #1   Title  independent with long term HEP    Time  4    Period  Weeks    Status  On-going    Target Date  04/07/18      PT LONG TERM GOAL #2   Title  Pt to demo ability for safe navigation of at least 5 stairs, with step-to pattern), with 1 hand rail, to improve safety at home.     Time  4    Period  Weeks    Status  Revised    Target Date  04/07/18      PT LONG TERM GOAL #3   Title  Pt to report decreased pain in R knee, to 0-2/10 with walking and stairs, to improve ability for community activities.     Time  4    Period   Weeks    Status  Partially Met    Target Date  04/07/18      PT LONG TERM GOAL #4   Title  Pt to demo ability for Tandem stance for 30 sec     Time  4    Period  Weeks    Status  New    Target Date  04/07/18      PT LONG TERM GOAL #5   Title  Pt to demo improved TUG score to increase by at least 3 seconds, to improve gait efficiency and speed.     Time  4    Period  Weeks    Target Date  04/07/18            Plan - 03/14/18 1619    Clinical Impression Statement  Pt will bring walker to next visit, to assess safety with use. Follow up testing for TUG and DGI done today. Pt continues to have very slow and cautious gait. Numbers on TUG not improved, likely due to recent fall from pt, and her being very fearful of falling. DGI not accurate measure of balance/score, due to pt being unable to follow verbal commands for head turns. She does have improved ability and safety with stepping up over cones, as well as on stairs. Pt with great difficulty increasing gait speed, even with light tactile cuing and max verbal cuing. Pt with no LOB with any exercises today. Pt more fearful of falling than her ability shows. Plan to progress balance and gait as able.     Rehab Potential  Good    PT Frequency  2x / week    PT Duration  4 weeks    PT Treatment/Interventions  ADLs/Self Care Home Management;Cryotherapy;Electrical Stimulation;Iontophoresis 55m/ml Dexamethasone;Moist Heat;Therapeutic activities;Functional mobility training;Stair training;Gait training;DME Instruction;Ultrasound;Therapeutic exercise;Balance training;Neuromuscular re-education;Patient/family education;Dry needling;Passive range of motion;Manual techniques;Taping;Spinal Manipulations;Joint Manipulations    Consulted and Agree with Plan of Care  Patient       Patient will benefit from skilled therapeutic intervention in order to improve the following deficits and impairments:  Abnormal gait, Decreased endurance, Pain, Decreased  strength, Decreased activity tolerance, Decreased balance, Decreased mobility, Difficulty walking, Improper body mechanics, Decreased safety awareness  Visit Diagnosis: Chronic pain of right knee  Other abnormalities of gait and mobility     Problem List Patient Active Problem List   Diagnosis Date Noted  . Fluency disorder associated with underlying disease 02/08/2018  . Cognitive and neurobehavioral dysfunction 02/08/2018  . 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 Arthritis of carpometacarpal ([facility] of left thumb 01/24/2018  . Ankle fracture, left 09/03/2016  . Hand dysfunction 03/11/2016  . Symptomatic varicose veins, bilateral 03/11/2016  . Numbness of left thumb 03/11/2016  . Elevated BP 07/27/2014  . Bunion of left foot 07/26/2014  . Abnormality of gait 07/26/2014  . Cough 11/10/2013  . Post-nasal drainage 11/10/2013  . Abnormal urine odor 11/10/2013  . Visit for preventive health examination 07/28/2013  . Right anterior knee pain 07/22/2012  . Medicare annual wellness visit, subsequent 07/25/2011  . ANXIETY, SITUATIONAL 09/25/2009  . ARTHRITIS 09/25/2009  . Vitamin D deficiency 09/27/2008  . Hyperlipidemia 09/17/2008  . Elevated blood pressure reading without diagnosis of hypertension 09/17/2008  . COLONIC POLYPS, HX OF 09/17/2008  . PERSONAL HISTORY DISEASES SKIN&SUBCUT TISSUE 09/17/2008  LLyndee Hensen PT, DPT 4:23 PM  03/14/18    [facility] [place] [address]Phone: [phone]  Fax:  [phone] Name: SJaydee IngmanMRN: [id number] of Birth: 124-Jan-1942

## 2018-03-14 NOTE — Progress Notes (Signed)
Pamela Alvarez. [facility] [phone]  Pamela Alvarez - 78 y.o. female MRN [id number]  Date of birth: 1941/02/07  Visit Date:   PCP: [name], MD   Referred by: [name], MD   SUBJECTIVE:  Chief Complaint  Patient presents with  . Follow-up    R knee pain    HPI: Patient is here for follow-up of right knee pain.  She reports only mild occasional aching pain.  She is here today with her husband who is reporting that she is doing slightly worse.  She has an upcoming MRI of her brain due to continued cognitive decline.  She also reports having had a fall 5 days ago where she was sitting in a chair leaning forward and fell directly out of the chair.  She has been followed by neurology for this.  She denies any loss of consciousness.  She is been seeing physical therapy for 6 visits.  She is continuing to use Aspercreme.  The Voltaren gel previously prescribed symptoms she is not interested in using.  REVIEW OF SYSTEMS: She has difficult time with sleep.  She does have some weakness and numbness in her left hand and thumb is chronic and unchanged.  Otherwise 12 point review of systems per intake form completed and other than the reported fall as above negative.  HISTORY:  Prior history reviewed and updated per electronic medical record.  Social History   Occupational History  . Occupation: retired    Comment: Social research officer, [name]  Tobacco Use  . Smoking status: Never Smoker  . Smokeless tobacco: Never Used  Substance and Sexual Activity  . Alcohol use: Yes    Alcohol/week: 7.0 standard drinks    Types: 7 Glasses of wine per week    Comment: one a day  . Drug use: Yes  . Sexual activity: Not on file   Social History   Social History Narrative   Regular exercise-yes   Moved back to [place] from [place]   Hh of 2 cat   G3 P2    Exercises walking regu;arly    Retired  Social research officer, government   Drinks one cup  of coffee  A day. In addition to walking QD she and her husband take a pilates class on Thursdays. She lives with her husband in a 2 story house, though the master bedroom in on the first level.          DATA OBTAINED & REVIEWED:  Recent Labs    04/22/17 1013  CALCIUM 9.4  TSH 2.30   No problems updated. No specialty comments available.  OBJECTIVE:  VS:  HT:5\' 2"  (157.5 cm)   WT:149 lb 12.8 oz (67.9 kg)  BMI:27.39    BP:(!) 150/80  HR:89bpm  TEMP: ( )  RESP:97 %   PHYSICAL EXAM: Adult female.  In no acute respiratory distress.  She has poor insight and has difficult time fully answering questions without affirmation from her husband.  She has a very flat affect.  Right knee is well aligned without effusion.  Ligamentously stable.  Extensor mechanism is intact.  Her sit to stand test is 3 to 4 seconds.  She is unstable on her feet with a wide-based gait.   ASSESSMENT   1. Chronic pain of right knee   2. Other abnormalities of gait and mobility   3. Difficulty in walking   4. Abnormality of gait     PLAN:  Pertinent additional documentation may be included in corresponding procedure notes, imaging studies, problem based documentation and patient instructions.  Procedures:  None  Medications:  No orders of the defined types were placed in this encounter.   Discussion/Instructions: No problem-specific Assessment & Plan notes found for this encounter.  RICE (Rest, ICE, Compression, Elevation) principles reviewed with the patient. Discussed red flag symptoms that warrant earlier emergent evaluation and patient voices understanding. >50% of this 25 minutes minute visit spent in direct patient counseling and/or coordination of care. Discussion was focused on education regarding the in discussing the pathoetiology and anticipated clinical course of the above condition.  Ultimately due to the underlying gait instability physical therapy should be continued at this time  an x-ray recommend that she start using a walker at home.  She has previously used 1.  She has been followed by neurology for altered mental status and the MRI that is upcoming should be telling to ensure there is no central process that is causing her gait disturbance and mental status changes.  Currently we will defer to neurology for further management and I am happy to see her back at any point if any acute knee exacerbations for consideration of injection.    Return if symptoms worsen or fail to improve.          Andrena Mews, DO    [facility] Physician

## 2018-03-15 ENCOUNTER — Other Ambulatory Visit: Payer: Self-pay | Admitting: Neurology

## 2018-03-15 ENCOUNTER — Ambulatory Visit
Admission: RE | Admit: 2018-03-15 | Discharge: 2018-03-15 | Disposition: A | Discharge status: Home / Self Care | Payer: Medicare Other | Source: Ambulatory Visit | Attending: Neurology | Admitting: Neurology

## 2018-03-15 DIAGNOSIS — R4182 Altered mental status, unspecified: Secondary | ICD-10-CM | POA: Diagnosis not present

## 2018-03-15 DIAGNOSIS — R4181 Age-related cognitive decline: Secondary | ICD-10-CM

## 2018-03-16 ENCOUNTER — Ambulatory Visit: Payer: Medicare Other | Admitting: Physical Therapy

## 2018-03-16 ENCOUNTER — Encounter: Payer: Self-pay | Admitting: Sports Medicine

## 2018-03-16 ENCOUNTER — Encounter: Payer: Self-pay | Admitting: Physical Therapy

## 2018-03-16 DIAGNOSIS — R2689 Other abnormalities of gait and mobility: Secondary | ICD-10-CM | POA: Diagnosis not present

## 2018-03-16 DIAGNOSIS — G8929 Other chronic pain: Secondary | ICD-10-CM | POA: Diagnosis not present

## 2018-03-16 DIAGNOSIS — M25561 Pain in right knee: Secondary | ICD-10-CM

## 2018-03-16 NOTE — Therapy (Signed)
[facility] [address] [place], [place], [address] Phone: [phone]   Fax:  [phone]  Physical Therapy Treatment  Patient Details  Name: Pamela Alvarez MRN: [id number] Date of Birth: 03/21/1940 Referring Provider (PT): Teresa Coombs   Encounter Date: 03/16/2018  PT End of Session - 03/16/18 1642    Visit Number  8    Number of Visits  20    Date for PT Re-Evaluation  04/07/18    Authorization Type  UHC    PT Start Time  1102    PT Stop Time  1148    PT Time Calculation (min)  46 min    Activity Tolerance  Patient tolerated treatment well    Behavior During Therapy  [facility] for tasks assessed/performed;Flat affect       Past Medical History:  Diagnosis Date  . Cyst 07-2008   Cyst on back x2 that were drained  . H/O echocardiogram 02-2000  . Hx of colonic polyps   . Hyperlipidemia   . Hypertension   . Varicose veins of both lower extremities     Past Surgical History:  Procedure Laterality Date  . POLYPECTOMY     Colon  . TONSILLECTOMY    . TUBAL LIGATION      There were no vitals filed for this visit.  Subjective Assessment - 03/16/18 1641    Subjective  Pt brought 4 WW to visit today.     Currently in Pain?  No/denies    Pain Score  0-No pain         [facility] PT Assessment - 03/16/18 1103      Dynamic Gait Index   Level Surface  Mild Impairment    Change in Gait Speed  Moderate Impairment    Gait with Horizontal Head Turns  Severe Impairment    Gait with Vertical Head Turns  Moderate Impairment    Gait and Pivot Turn  Mild Impairment    Step Over Obstacle  Moderate Impairment    Step Around Obstacles  Mild Impairment    Steps  Moderate Impairment    Total Score  10    DGI comment:  Pt with great difficulty following commands to test, unable to follow directions to look L, R, or for speed. * not accurate score for fall prediction.       Timed Up and Go Test   Normal TUG (seconds)  22.9    TUG Comments   Increased time since last tested; After practice for increasing walking speed: 1 time test: 20.09 sec;                    [facility] Adult PT Treatment/Exercise - 03/16/18 0001      Exercises   Exercises  Knee/Hip      Knee/Hip Exercises: Aerobic   Stationary Bike  L1 x 8 min    Other Aerobic  ambulation with practice for use of 4 [place]; 300 ft, with direction changes and education for safety.       Knee/Hip Exercises: Standing   Heel Raises  --    Hip Flexion  20 reps;Knee bent    Hip Abduction  2 sets;10 reps;Both    Stairs  5 steps up/down 2 hand rails; x6     Other Standing Knee Exercises  --      Knee/Hip Exercises: Seated   Sit to Sand  10 reps;2 sets;without UE support  PT Education - 03/16/18 1641    Education Details  Education with pt and husband on walker safety, and home safety with throw rugs and manuvering in tight spaces.       PT Short Term Goals - 03/10/18 1451      PT SHORT TERM GOAL #1   Title  independent with initial HEP    Time  2    Period  Weeks    Status  Achieved      PT SHORT TERM GOAL #2   Title  Pt to demo ability to safely navigate 3 stairs with 2 hand rails, (step-to pattern)    Time  2    Period  Weeks    Status  Revised    Target Date  03/24/18        PT Long Term Goals - 03/10/18 1452      PT LONG TERM GOAL #1   Title  independent with long term HEP    Time  4    Period  Weeks    Status  On-going    Target Date  04/07/18      PT LONG TERM GOAL #2   Title  Pt to demo ability for safe navigation of at least 5 stairs, with step-to pattern), with 1 hand rail, to improve safety at home.     Time  4    Period  Weeks    Status  Revised    Target Date  04/07/18      PT LONG TERM GOAL #3   Title  Pt to report decreased pain in R knee, to 0-2/10 with walking and stairs, to improve ability for community activities.     Time  4    Period  Weeks    Status  Partially Met    Target Date  04/07/18      PT  LONG TERM GOAL #4   Title  Pt to demo ability for Tandem stance for 30 sec     Time  4    Period  Weeks    Status  New    Target Date  04/07/18      PT LONG TERM GOAL #5   Title  Pt to demo improved TUG score to increase by at least 3 seconds, to improve gait efficiency and speed.     Time  4    Period  Weeks    Target Date  04/07/18            Plan - 03/16/18 1643    Clinical Impression Statement  Pt demonstrates ability for safe ambulation with 4 WW today. Height of walker adjusted to be accurate. Pt with good safety with straight direction walking, as well as direction changes and corners. She does have difficulty motor panning when turning to sit in chair, as well as hand placement with transfers and walker. Discussed safety with use of walker at home with pt and husband. Recommended use when she is out in community, but to continue to ambulate in her home for majority of time, without walker, to continue to improve confidence with walking. Pt does have improved walking speed with walker, but states she is still fearful even with use of RW.     Rehab Potential  Good    PT Frequency  2x / week    PT Duration  4 weeks    PT Treatment/Interventions  ADLs/Self Care Home Management;Cryotherapy;Electrical Stimulation;Iontophoresis '4mg'$ /ml Dexamethasone;Moist Heat;Therapeutic activities;Functional mobility training;Stair training;Gait training;DME Instruction;Ultrasound;Therapeutic  exercise;Balance training;Neuromuscular re-education;Patient/family education;Dry needling;Passive range of motion;Manual techniques;Taping;Spinal Manipulations;Joint Manipulations    Consulted and Agree with Plan of Care  Patient       Patient will benefit from skilled therapeutic intervention in order to improve the following deficits and impairments:  Abnormal gait, Decreased endurance, Pain, Decreased strength, Decreased activity tolerance, Decreased balance, Decreased mobility, Difficulty walking, Improper  body mechanics, Decreased safety awareness  Visit Diagnosis: Chronic pain of right knee  Other abnormalities of gait and mobility     Problem List Patient Active Problem List   Diagnosis Date Noted  . Fluency disorder associated with underlying disease 02/08/2018  . Cognitive and neurobehavioral dysfunction 02/08/2018  . 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 Arthritis of carpometacarpal [facility]) joint of left thumb 01/24/2018  . Ankle fracture, left 09/03/2016  . Hand dysfunction 03/11/2016  . Symptomatic varicose veins, bilateral 03/11/2016  . Numbness of left thumb 03/11/2016  . Elevated BP 07/27/2014  . Bunion of left foot 07/26/2014  . Abnormality of gait 07/26/2014  . Cough 11/10/2013  . Post-nasal drainage 11/10/2013  . Abnormal urine odor 11/10/2013  . Visit for preventive health examination 07/28/2013  . Right anterior knee pain 07/22/2012  . Medicare annual wellness visit, subsequent 07/25/2011  . ANXIETY, SITUATIONAL 09/25/2009  . ARTHRITIS 09/25/2009  . Vitamin D deficiency 09/27/2008  . Hyperlipidemia 09/17/2008  . Elevated blood pressure reading without diagnosis of hypertension 09/17/2008  . COLONIC POLYPS, HX OF 09/17/2008  . PERSONAL HISTORY DISEASES SKIN&SUBCUT TISSUE 09/17/2008    Lyndee Hensen, PT, DPT 4:45 PM  03/16/18    [facility] [place], [place], [address] Phone: [phone]   Fax:  [phone]  Name: Earlene Bjelland MRN: [id number] Date of Birth: Oct 18, 1940

## 2018-03-21 ENCOUNTER — Encounter: Payer: Self-pay | Admitting: Physical Therapy

## 2018-03-21 ENCOUNTER — Ambulatory Visit: Payer: Medicare Other | Admitting: Physical Therapy

## 2018-03-21 ENCOUNTER — Encounter: Payer: Medicare Other | Admitting: Physical Therapy

## 2018-03-21 ENCOUNTER — Telehealth: Payer: Self-pay | Admitting: Neurology

## 2018-03-21 DIAGNOSIS — R2689 Other abnormalities of gait and mobility: Secondary | ICD-10-CM | POA: Diagnosis not present

## 2018-03-21 DIAGNOSIS — G8929 Other chronic pain: Secondary | ICD-10-CM

## 2018-03-21 DIAGNOSIS — M25561 Pain in right knee: Secondary | ICD-10-CM | POA: Diagnosis not present

## 2018-03-21 NOTE — Telephone Encounter (Signed)
Called the patient to review the MRI results, no answer. LVM for pt to call back.

## 2018-03-21 NOTE — Telephone Encounter (Signed)
-----   Message from Melvyn Novas, MD sent at 03/17/2018  4:43 PM EST ----- Abnormal MRI brain (without) demonstrating: - Right frontal, right parietal and right cerebellar encephalomalacia and gliosis.  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  Underlying neoplastic or vascular lesions cannot be excluded, and would recommend follow-up imaging with postcontrast views. - Mild chronic small vessel ischemic disease. No acute changes, lesions.

## 2018-03-22 NOTE — Telephone Encounter (Signed)
Called and spoke with the patient and her husband in regards to her MRI results. The MRI was ordered with contrast initially but the patient at the time of completing the rest chose to not have it with contrast. I informed them the MRI showed old infarcts but nothing appeared to be new. Informed there was small vessel disease which can be commonly seen in patients with memory but also can be associated with patients who have history of HTN, High blood sugar, headaches, strokes. The husband had asked if this appeared indicative of dementia and informed that the small vessel disease was present. Informed them if the patient would like to redo the MRI with contrast  Dr Dohmeier recommended completing but at this time the pt refuses. She will completed the EEG tomorrow. Advised I will call with those results once we have read them. Pt verbalized understanding.

## 2018-03-23 ENCOUNTER — Encounter: Payer: Self-pay | Admitting: Physical Therapy

## 2018-03-23 ENCOUNTER — Ambulatory Visit: Payer: Medicare Other | Admitting: Neurology

## 2018-03-23 DIAGNOSIS — R410 Disorientation, unspecified: Secondary | ICD-10-CM

## 2018-03-23 DIAGNOSIS — G231 Progressive supranuclear ophthalmoplegia [Steele-Richardson-Olszewski]: Secondary | ICD-10-CM

## 2018-03-23 DIAGNOSIS — R4782 Fluency disorder in conditions classified elsewhere: Secondary | ICD-10-CM

## 2018-03-23 DIAGNOSIS — F09 Unspecified mental disorder due to known physiological condition: Secondary | ICD-10-CM

## 2018-03-23 DIAGNOSIS — H510 Palsy (spasm) of conjugate gaze: Secondary | ICD-10-CM

## 2018-03-23 DIAGNOSIS — F0789 Other personality and behavioral disorders due to known physiological condition: Secondary | ICD-10-CM

## 2018-03-23 NOTE — Therapy (Signed)
[facility] [address] [place], [place], [address] Phone: [phone]   Fax:  [phone]  Physical Therapy Treatment  Patient Details  Name: Pamela Alvarez MRN: [id number] Date of Birth: 10/31/1940 Referring Provider (PT): Teresa Coombs   Encounter Date: 03/21/2018  PT End of Session - 03/23/18 1434    Visit Number  9    Number of Visits  20    Date for PT Re-Evaluation  04/07/18    Authorization Type  UHC    PT Start Time  9563    PT Stop Time  1428    PT Time Calculation (min)  39 min    Activity Tolerance  Patient tolerated treatment well    Behavior During Therapy  [facility] for tasks assessed/performed;Flat affect       Past Medical History:  Diagnosis Date  . Cyst 07-2008   Cyst on back x2 that were drained  . H/O echocardiogram 02-2000  . Hx of colonic polyps   . Hyperlipidemia   . Hypertension   . Varicose veins of both lower extremities     Past Surgical History:  Procedure Laterality Date  . POLYPECTOMY     Colon  . TONSILLECTOMY    . TUBAL LIGATION      There were no vitals filed for this visit.  Subjective Assessment - 03/23/18 1433    Subjective  Pt with no new complaints today. She will wait to see Neurologist for results of MRI.     Patient Stated Goals  Improve walking/ falls     Currently in Pain?  No/denies    Pain Score  0-No pain                       [facility] Adult PT Treatment/Exercise - 03/23/18 0001      Exercises   Exercises  Knee/Hip      Knee/Hip Exercises: Aerobic   Stationary Bike  L1 x 8 min    Other Aerobic  Ambulation 35 ft x8: bwd x4; Side stepping 25 ft x4;       Knee/Hip Exercises: Standing   Hip Flexion  20 reps;Knee bent    Hip Abduction  2 sets;10 reps;Both    Forward Step Up  10 reps;Step Height: 6";Hand Hold: 1    Stairs  5 steps up/down 2 hand rails; x6       Knee/Hip Exercises: Seated   Sit to Sand  without UE support;15 reps               PT  Short Term Goals - 03/10/18 1451      PT SHORT TERM GOAL #1   Title  independent with initial HEP    Time  2    Period  Weeks    Status  Achieved      PT SHORT TERM GOAL #2   Title  Pt to demo ability to safely navigate 3 stairs with 2 hand rails, (step-to pattern)    Time  2    Period  Weeks    Status  Revised    Target Date  03/24/18        PT Long Term Goals - 03/10/18 1452      PT LONG TERM GOAL #1   Title  independent with long term HEP    Time  4    Period  Weeks    Status  On-going    Target Date  04/07/18  PT LONG TERM GOAL #2   Title  Pt to demo ability for safe navigation of at least 5 stairs, with step-to pattern), with 1 hand rail, to improve safety at home.     Time  4    Period  Weeks    Status  Revised    Target Date  04/07/18      PT LONG TERM GOAL #3   Title  Pt to report decreased pain in R knee, to 0-2/10 with walking and stairs, to improve ability for community activities.     Time  4    Period  Weeks    Status  Partially Met    Target Date  04/07/18      PT LONG TERM GOAL #4   Title  Pt to demo ability for Tandem stance for 30 sec     Time  4    Period  Weeks    Status  New    Target Date  04/07/18      PT LONG TERM GOAL #5   Title  Pt to demo improved TUG score to increase by at least 3 seconds, to improve gait efficiency and speed.     Time  4    Period  Weeks    Target Date  04/07/18            Plan - 03/23/18 1435    Clinical Impression Statement  Pt with no lob during ambulation without walker. She has increased fear of falling, that does not correlate with her balance/gait ability. She has shown mild improvments with gait speed. Dynamic gait very difficult to perform or asses, due to inability to follow verbal and/or tactile commands to perform. Plan to work towards d/c in next 1-2 weeks. Pt educated on need to start ambulating more at home, for improved confidence.     Rehab Potential  Good    PT Frequency  2x / week     PT Duration  4 weeks    PT Treatment/Interventions  ADLs/Self Care Home Management;Cryotherapy;Electrical Stimulation;Iontophoresis 11m/ml Dexamethasone;Moist Heat;Therapeutic activities;Functional mobility training;Stair training;Gait training;DME Instruction;Ultrasound;Therapeutic exercise;Balance training;Neuromuscular re-education;Patient/family education;Dry needling;Passive range of motion;Manual techniques;Taping;Spinal Manipulations;Joint Manipulations    Consulted and Agree with Plan of Care  Patient       Patient will benefit from skilled therapeutic intervention in order to improve the following deficits and impairments:  Abnormal gait, Decreased endurance, Pain, Decreased strength, Decreased activity tolerance, Decreased balance, Decreased mobility, Difficulty walking, Improper body mechanics, Decreased safety awareness  Visit Diagnosis: Chronic pain of right knee  Other abnormalities of gait and mobility     Problem List Patient Active Problem List   Diagnosis Date Noted  . Fluency disorder associated with underlying disease 02/08/2018  . Cognitive and neurobehavioral dysfunction 02/08/2018  . 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 Arthritis of carpometacarpal ([facility] joint of left thumb 01/24/2018  . Ankle fracture, left 09/03/2016  . Hand dysfunction 03/11/2016  . Symptomatic varicose veins, bilateral 03/11/2016  . Numbness of left thumb 03/11/2016  . Elevated BP 07/27/2014  . Bunion of left foot 07/26/2014  . Abnormality of gait 07/26/2014  . Cough 11/10/2013  . Post-nasal drainage 11/10/2013  . Abnormal urine odor 11/10/2013  . Visit for preventive health examination 07/28/2013  . Right anterior knee pain 07/22/2012  . Medicare annual wellness visit, subsequent 07/25/2011  . ANXIETY, SITUATIONAL 09/25/2009  . ARTHRITIS 09/25/2009  . Vitamin D deficiency 09/27/2008  . Hyperlipidemia 09/17/2008  . Elevated blood pressure reading without  diagnosis of hypertension  09/17/2008  . COLONIC POLYPS, HX OF 09/17/2008  . PERSONAL HISTORY DISEASES SKIN&SUBCUT TISSUE 09/17/2008    Lyndee Hensen, PT, DPT 2:37 PM  03/23/18    [facility] [place], [place], [address] Phone: [phone]   Fax:  [phone]  Name: Pamela Alvarez MRN: [id number] Date of Birth: 20-Jan-1941

## 2018-03-24 ENCOUNTER — Encounter: Payer: Medicare Other | Admitting: Physical Therapy

## 2018-04-04 ENCOUNTER — Encounter: Payer: Self-pay | Admitting: Physical Therapy

## 2018-04-04 ENCOUNTER — Telehealth: Payer: Self-pay | Admitting: Neurology

## 2018-04-04 ENCOUNTER — Ambulatory Visit: Payer: Medicare Other | Admitting: Physical Therapy

## 2018-04-04 DIAGNOSIS — R2689 Other abnormalities of gait and mobility: Secondary | ICD-10-CM

## 2018-04-04 DIAGNOSIS — M25561 Pain in right knee: Secondary | ICD-10-CM

## 2018-04-04 DIAGNOSIS — G8929 Other chronic pain: Secondary | ICD-10-CM | POA: Diagnosis not present

## 2018-04-04 NOTE — Procedures (Signed)
This is an EEG report for the patient Pamela Apley, [name].  The patient is a 78 year old female suffering from memory loss.  The study had a running time of 26 minutes 51 seconds and was performed on 23 March 2018.  The awake and relaxed patient was asked to open and close her eyes, but had apparently difficulties following the technologist's direction. A posterior dominant rhythm of only 7 hertz was noted, constituting slowing.  Also noted were frontopolar swaying eye movements, irregular EKG with sinus rhythm, and regular respiration.  Hyperventilation had been deferred, but photic stimulation was performed with frequencies of 1, 3, 6, 9, and 12 Hz.  There was minimal photic entrainment noted at 9 Hz, no excessive eye blinking was noted, and the posterior dominant rhythm reverted back to 7 Hz between photic stimulation.  The montage was set as a double banana/ referential bipolar montage.  Given the clinical observation that the patient had difficulties following simple instructions and the slowing throughout her EEG recording, the study is most consistent with a static encephalopathy.  This can be including dementia , metabolic or toxic encephalopathies, but is unlikely to be seen with trauma, focal strokes.  Melvyn Novas, MD

## 2018-04-04 NOTE — Telephone Encounter (Signed)
EEG with slowed activity 7 Herz, not modulated by eye opening, photic stimuli, etc.  No epileptiform activity. Most consistent with a static encephalopathy, such as dementia.  CD

## 2018-04-04 NOTE — Therapy (Signed)
[facility] [address] [place], [place], [address] Phone: [phone]   Fax:  [phone]  Physical Therapy Treatment/Discharge  Patient Details  Name: Pamela Alvarez MRN: [id number] Date of Birth: 13-Jan-1941 Referring Provider (PT): Teresa Coombs   Encounter Date: 04/04/2018  PT End of Session - 04/04/18 1513    Visit Number  10    Number of Visits  20    Date for PT Re-Evaluation  04/07/18    Authorization Type  UHC    PT Start Time  7371    PT Stop Time  1515    PT Time Calculation (min)  43 min    Activity Tolerance  Patient tolerated treatment well    Behavior During Therapy  [facility] for tasks assessed/performed;Flat affect       Past Medical History:  Diagnosis Date  . Cyst 07-2008   Cyst on back x2 that were drained  . H/O echocardiogram 02-2000  . Hx of colonic polyps   . Hyperlipidemia   . Hypertension   . Varicose veins of both lower extremities     Past Surgical History:  Procedure Laterality Date  . POLYPECTOMY     Colon  . TONSILLECTOMY    . TUBAL LIGATION      There were no vitals filed for this visit.  Subjective Assessment - 04/04/18 1512    Subjective  No new complaints. She has been walking in her house with her husband. She states knee pain is better.     Currently in Pain?  No/denies    Pain Score  0-No pain         [facility] PT Assessment - 04/04/18 0001      Timed Up and Go Test   Normal TUG (seconds)  21.8    TUG Comments  With MAX verbal cuing to "walk as fast as you can"                    [facility] Adult PT Treatment/Exercise - 04/04/18 1437      Ambulation/Gait   Gait Comments  Tandem stance: 10 sec bilaterally      Exercises   Exercises  Knee/Hip      Knee/Hip Exercises: Aerobic   Stationary Bike  L1 x 5 min    Other Aerobic  Ambulation 35 ft x10:  Side stepping 35 ft x2;       Knee/Hip Exercises: Standing   Hip Flexion  20 reps;Knee bent    Hip Abduction  2 sets;10  reps;Both    Forward Step Up  --    Stairs  5 steps up/down 2 hand rails; x5     Other Standing Knee Exercises  AirEx Weight shifts L/R and A/P at 20 each; (very minimal sway seen)       Knee/Hip Exercises: Seated   Sit to Sand  without UE support;10 reps             PT Education - 04/04/18 1513    Education Details  Discussed HEP , D/C and home safety  with husband, and pt.     Person(s) Educated  Patient;Spouse    Methods  Explanation;Handout    Comprehension  Verbalized understanding       PT Short Term Goals - 04/04/18 1514      PT SHORT TERM GOAL #1   Title  independent with initial HEP    Time  2    Period  Weeks    Status  Achieved      PT SHORT TERM GOAL #2   Title  Pt to demo ability to safely navigate 3 stairs with 2 hand rails, (step-to pattern)    Time  2    Period  Weeks    Status  Achieved        PT Long Term Goals - 04/04/18 1514      PT LONG TERM GOAL #1   Title  independent with long term HEP    Time  4    Period  Weeks    Status  Achieved      PT LONG TERM GOAL #2   Title  Pt to demo ability for safe navigation of at least 5 stairs, with step-to pattern), with 1 hand rail, to improve safety at home.     Time  4    Period  Weeks    Status  Achieved      PT LONG TERM GOAL #3   Title  Pt to report decreased pain in R knee, to 0-2/10 with walking and stairs, to improve ability for community activities.     Time  4    Period  Weeks    Status  Achieved      PT LONG TERM GOAL #4   Title  Pt to demo ability for Tandem stance for 30 sec     Time  4    Period  Weeks    Status  Not Met      PT LONG TERM GOAL #5   Title  Pt to demo improved TUG score to increase by at least 3 seconds, to improve gait efficiency and speed.     Time  4    Period  Weeks    Status  Not Met            Plan - 04/04/18 1611    Clinical Impression Statement  Pt has been seen for 10 visits. Focus has been on weight shifts, balance, stairs, gait and safety  with mobility. Pt has not shown signs of lob or poor balance during sessions. She does have significant difficulty and inability to follow verbal and tactile commands to perform these weight shift , gait, and balance activities, which has been challenging. She demonstrates safe ambulation, but has very slow speed. She has been unable to improve speed with max verbal and tactile cuing, and states that she does feel fearful when walking. She is unable to use reciprocol pattern on stairs. She shows adequate safety on stairs. We have practiced and discussed use of RW for improved confidence when practicing walking at home, but not to use at all times. Final HEP reviewed today, with handout and husband given directions as well, for continued LE strengthening. R knee pain seems to be resolved. Pt not making significant progress with gait and mobility at this time. She has imrpoved stength and abiltiy for stair climbing, but has not improved scores on balance tests or gait speed. Balance tests difficult to score due to inability to follow verbal commands. She does have follow up with Neuro this week. Discussed d/c plan with pt and husband today. Pt to be discharged at this time. Also discussed continued activitiy with bike at home, walking, and return to pilates if able.     Rehab Potential  Good    PT Frequency  2x / week    PT Duration  4 weeks    PT Treatment/Interventions  ADLs/Self Care Home Management;Cryotherapy;Electrical Stimulation;Iontophoresis '4mg'$ /ml  Dexamethasone;Moist Heat;Therapeutic activities;Functional mobility training;Stair training;Gait training;DME Instruction;Ultrasound;Therapeutic exercise;Balance training;Neuromuscular re-education;Patient/family education;Dry needling;Passive range of motion;Manual techniques;Taping;Spinal Manipulations;Joint Manipulations    Consulted and Agree with Plan of Care  Patient       Patient will benefit from skilled therapeutic intervention in order to improve  the following deficits and impairments:  Abnormal gait, Decreased endurance, Pain, Decreased strength, Decreased activity tolerance, Decreased balance, Decreased mobility, Difficulty walking, Improper body mechanics, Decreased safety awareness  Visit Diagnosis: Chronic pain of right knee  Other abnormalities of gait and mobility     Problem List Patient Active Problem List   Diagnosis Date Noted  . Fluency disorder associated with underlying disease 02/08/2018  . Cognitive and neurobehavioral dysfunction 02/08/2018  . 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 Arthritis of carpometacarpal [facility]) joint of left thumb 01/24/2018  . Ankle fracture, left 09/03/2016  . Hand dysfunction 03/11/2016  . Symptomatic varicose veins, bilateral 03/11/2016  . Numbness of left thumb 03/11/2016  . Elevated BP 07/27/2014  . Bunion of left foot 07/26/2014  . Abnormality of gait 07/26/2014  . Cough 11/10/2013  . Post-nasal drainage 11/10/2013  . Abnormal urine odor 11/10/2013  . Visit for preventive health examination 07/28/2013  . Right anterior knee pain 07/22/2012  . Medicare annual wellness visit, subsequent 07/25/2011  . ANXIETY, SITUATIONAL 09/25/2009  . ARTHRITIS 09/25/2009  . Vitamin D deficiency 09/27/2008  . Hyperlipidemia 09/17/2008  . Elevated blood pressure reading without diagnosis of hypertension 09/17/2008  . COLONIC POLYPS, HX OF 09/17/2008  . PERSONAL HISTORY DISEASES SKIN&SUBCUT TISSUE 09/17/2008    Lyndee Hensen, PT, DPT 4:20 PM  04/04/18    [facility] [place], [place], [address] Phone: [phone]   Fax:  [phone]  Name: Pamela Alvarez MRN: [id number] Date of Birth: [birth year]   PHYSICAL THERAPY DISCHARGE SUMMARY  Visits from Start of Care:10   Plan: Patient agrees to discharge.  Patient goals were partially met. Patient is being discharged due to lack of progress.  ?????     Lyndee Hensen, PT,  DPT 4:21 PM  04/04/18

## 2018-04-05 ENCOUNTER — Telehealth: Payer: Self-pay | Admitting: Neurology

## 2018-04-05 NOTE — Telephone Encounter (Signed)
-----   Message from Melvyn Novasarmen Dohmeier, MD sent at 04/05/2018 11:42 AM EST ----- Abnormally slow EEG-

## 2018-04-05 NOTE — Telephone Encounter (Signed)
Called the pt to advise of the EEG results. No answer. LVM for the patient to call back.

## 2018-04-05 NOTE — Telephone Encounter (Signed)
Called, no answer. LVM informing the patient to call back to discuss EEG results.

## 2018-04-07 ENCOUNTER — Encounter: Payer: Self-pay | Admitting: Neurology

## 2018-04-12 ENCOUNTER — Ambulatory Visit: Payer: Medicare Other | Admitting: Neurology

## 2018-04-12 ENCOUNTER — Encounter: Payer: Self-pay | Admitting: Neurology

## 2018-04-12 VITALS — BP 168/88 | HR 105 | Ht 62.0 in | Wt 149.0 lb

## 2018-04-12 DIAGNOSIS — F09 Unspecified mental disorder due to known physiological condition: Secondary | ICD-10-CM

## 2018-04-12 DIAGNOSIS — H519 Unspecified disorder of binocular movement: Secondary | ICD-10-CM

## 2018-04-12 DIAGNOSIS — R030 Elevated blood-pressure reading, without diagnosis of hypertension: Secondary | ICD-10-CM

## 2018-04-12 DIAGNOSIS — G9389 Other specified disorders of brain: Secondary | ICD-10-CM

## 2018-04-12 DIAGNOSIS — R930 Abnormal findings on diagnostic imaging of skull and head, not elsewhere classified: Secondary | ICD-10-CM

## 2018-04-12 DIAGNOSIS — F0789 Other personality and behavioral disorders due to known physiological condition: Secondary | ICD-10-CM

## 2018-04-12 DIAGNOSIS — R4782 Fluency disorder in conditions classified elsewhere: Secondary | ICD-10-CM

## 2018-04-12 MED ORDER — ALPRAZOLAM 0.5 MG PO TABS: ORAL_TABLET | ORAL | 0 refills | Status: DC

## 2018-04-12 NOTE — Progress Notes (Signed)
Provider:  Melvyn Novasarmen  Niva Murren, MD    Referring Provider: Madelin HeadingsPanosh, [name], MD Primary Care Physician:  Madelin HeadingsPanosh, [name], MD  Chief Complaint  Patient presents with  . Follow-up    pt with husband, rm 10. pt states that things are going well since visit in december. here today to discuss MRI and EEG results.     HPI: Dr. Lorie ApleySandra Pletz, [name]  is a 78 y.o. female patient, seen here on 02-08-2018 in a referral from Dr. [name] for a "second opinion " in the work up for memory loss.  The patient reports having been stumped by the previously see neurologist in [place], who reportedly diagnosed her with Alzheimer's Dementia. 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 She has a [name] and reports she had some learning disability - not learning or memory related, but an inability to calculate.   Dr. Early Charsill had always been acute aware of her learning abilities, but over the last couple of years there has been evidence of a declining cognitive ability.  The patient broke her ankle at the time and while she recovered from this supposedly only bony injury there were several changes, she felt always cold it was as if her enough thermostat had changed, she also noted and memory decline, may be a personality change, she had trouble retrieving words and her vocabulary became progressively restricted, her speech poor. She has ever since gait and balance impairment. She is in PT twice weekly.   Family history - mother had PD.father had CAD, died at 79 years old, cancer.  Social history- [name], no history of tobacco , ETOH- used to have one glass of wine with dinner, now 2-3 days a week.  No  recreatinonal drugs. 2 sons , aged 79 and 2255.    RV 04-12-2018,      [facility]  NEUROIMAGING REPORT   STUDY DATE: 03/15/18 PATIENT NAME: Pamela ApleySandra Alvarez DOB: 02/10/1941 MRN: [id number]  ORDERING CLINICIAN: Melvyn Novasarmen Gurpreet Mikhail, MD  CLINICAL HISTORY: 78 year old female  with memory loss and confusion.  EXAM: MRI brain (without)  TECHNIQUE: MRI of the brain without contrast was obtained utilizing 5 mm axial slices with T1, T2, T2 flair, SWI and diffusion weighted views.  T1 sagittal and T2 coronal views were obtained. CONTRAST: no (patient declined IV contrast) COMPARISON: none  IMAGING SITE: [facility] [address] (1.5 Tesla MRI)    FINDINGS:   Abnormal T2 FLAIR hyperintense signal in the right frontal, right parietal and right cerebellar regions, with encephalomalacia and gliosis.  However there also appears to be underlying heterogeneous lesions with surrounding vasogenic edema.  On SWI views, there is increased susceptibility within the rim of these regions as well indicating mineralization or hemosiderin deposition.  Considerations would include embolic chronic ischemic infarcts, although underlying neoplasm (metastases) cannot be excluded.   Elsewhere few punctate foci of nonspecific gliosis in the subcortical white matter.  No abnormal lesions are seen on diffusion-weighted views to suggest acute ischemia. The cortical sulci, fissures and cisterns are normal in size and appearance. Lateral, third and fourth ventricle are normal in size and appearance. No extra-axial fluid collections are seen. No evidence of mass effect or midline shift.    On sagittal views the posterior fossa, pituitary gland and corpus callosum are unremarkable. No evidence of intracranial hemorrhage on SWI views. The orbits and their contents, paranasal sinuses and calvarium are unremarkable.  Intracranial flow voids are present.  IMPRESSION:  Abnormal MRI brain (without) demonstrating: - Right frontal, right parietal and right cerebellar encephalomalacia and gliosis.  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  Underlying neoplastic or vascular lesions cannot be excluded, and would recommend follow-up imaging with postcontrast views. - Mild  chronic small vessel ischemic disease. - No acute findings.   INTERPRETING PHYSICIAN:  Suanne MarkerVIKRAM R. PENUMALLI, MD Certified in Neurology, Neurophysiology and Neuroimaging    Review of Systems: Out of a complete 14 system review, the patient complains of only the following symptoms, and all other reviewed systems are negative.  Patient has several fall,  Hit the back of the head, now right encephalomalcia.  Poverty of speech in a patient with a [name] degree , stiff gait, left-right confusion acalculia.  No Dysphagia. Mild Dysphonia. No loss of smell or taste.     Social History   Socioeconomic History  . Marital status: Married    Spouse name: Pamela Alvarez  . Number of children: 2  . Years of education: Not on file  . Highest education level: Doctorate  Occupational History  . Occupation: retired    Comment: Actorchool psychologist [name]  Social Needs  . Financial resource strain: Not on file  . Food insecurity:    Worry: Not on file    Inability: Not on file  . Transportation needs:    Medical: Not on file    Non-medical: Not on file  Tobacco Use  . Smoking status: Never Smoker  . Smokeless tobacco: Never Used  Substance and Sexual Activity  . Alcohol use: Yes    Alcohol/week: 7.0 standard drinks    Types: 7 Glasses of wine per week    Comment: one a day  . Drug use: Yes  . Sexual activity: Not on file  Lifestyle  . Physical activity:    Days per week: Not on file    Minutes per session: Not on file  . Stress: Not on file  Relationships  . Social connections:    Talks on phone: Not on file    Gets together: Not on file    Attends religious service: Not on file    Active member of club or organization: Not on file    Attends meetings of clubs or organizations: Not on file    Relationship status: Not on file  . Intimate partner violence:    Fear of current or ex partner: Not on file    Emotionally abused: Not on file    Physically abused: Not on file    Forced sexual  activity: Not on file  Other Topics Concern  . Not on file  Social History Narrative   Regular exercise-yes   [place]oved back [place]   Hh of 2 cat   G3 P2    Exercises walking regu;arly    Retired  Social research officer, governmentchool psychologist   Drinks one cup of coffee  A day. In addition to walking QD she and her husband take a pilates class on Thursdays. She lives with her husband in a 2 story house, though the master bedroom in on the first level.        Family History  Problem Relation Age of Onset  . Hypertension Mother   . Parkinsonism Mother   . Angina Father   . Heart disease Father   . Colon cancer Father   . Lung cancer Father   . Skin cancer Father   . Cancer Father        Heart  .  Breast cancer Sister        over 52  . Diabetes Sister   . Heart disease Brother 18       triple bypass surgery  . Macular degeneration Sister   . Diabetes Sister        prediabetic  . Hearing loss Sister        hearing problems  . Vasculitis Sister     Past Medical History:  Diagnosis Date  . Cyst 07-2008   Cyst on back x2 that were drained  . H/O echocardiogram 02-2000  . Hx of colonic polyps   . Hyperlipidemia   . Hypertension   . Varicose veins of both lower extremities     Past Surgical History:  Procedure Laterality Date  . POLYPECTOMY     Colon  . TONSILLECTOMY    . TUBAL LIGATION      Current Outpatient Medications  Medication Sig Dispense Refill  . cyanocobalamin 1000 MCG tablet Take 1,000 mcg by mouth daily.    Marland Kitchen losartan (COZAAR) 25 MG tablet TAKE 1 TABLET BY MOUTH EVERY DAY 90 tablet 0   No current facility-administered medications for this visit.     Allergies as of 04/12/2018 - Review Complete 04/12/2018  Allergen Reaction Noted  . Dust mite extract Cough 12/09/2015  . Sulfonamide derivatives      Vitals: BP (!) 168/88   Pulse (!) 105   Ht 5\' 2"  (1.575 m)   Wt 149 lb (67.6 kg)   BMI 27.25 kg/m  Last Weight:  Wt Readings from Last 1 Encounters:  04/12/18  149 lb (67.6 kg)   Last Height:   Ht Readings from Last 1 Encounters:  04/12/18 5\' 2"  (1.575 m)    Physical exam:  General: The patient is awake, alert and appears not in acute distress. The patient is well groomed. Head: Normocephalic, atraumatic. Neck is supple. Cardiovascular:  Regular rate and rhythm , without  murmurs or carotid bruit, and without distended neck veins. Respiratory: Lungs are clear to auscultation. Skin:  Without evidence of edema, or rash Trunk: BMI is 27.1 kg/m2  elevated and patient  has normal posture.  Neurologic exam : The patient is awake and alert, oriented to place and time.  Memory subjective described as impaired  There is an ab- normal attention span & concentration ability. Speech is non -fluent with aphasia. Mood and affect are meek.  Cranial nerves: Pupils are equal and briskly reactive to light.  Funduscopic exam without  evidence of pallor or edema. Extraocular movements  in vertical planes restricted, neither able to pursuit upwards not downwards gaze-  and horizontal planes with sharp, coarse saccades, skipping the central vision.  without nystagmus.  Reduced, rare blink -  Visual fields by finger perimetry are restricted  Hearing to finger rub intact.  Facial sensation intact to fine touch. Facial motor strength is symmetric, but reduced facial mimic.  Tongue and uvula move midline. Tongue protrusion into either cheek is normal. Shoulder shrug is normal.   Motor exam: elevated tone, symmetric muscle bulk and symmetric strength in all extremities.  Sensory:  Fine touch, pinprick and vibration were tested in all extremities. She seems to have higher sensitivity on the left, she did not show extinction. Proprioception was normal.  Coordination: Rapid alternating movements in the fingers/hands were normal. Finger-to-nose maneuver  normal without evidence of ataxia, dysmetria or tremor.  Gait and station: Patient walks without assistive device and  is able unassisted to climb up to the  exam table.  Strength within normal limits, but gait appears stiff, no rotation - Stance is stable and normal. Tandem gait was deferred.  Romberg testing is negative   Deep tendon reflexes: in the upper and lower extremities are symmetric and intact. Babinski maneuver response is  downgoing.  Assessment:  After physical and neurologic examination, review of laboratory studies, imaging, neurophysiology testing and pre-existing records, assessment is that of :   Neurodegenerative disease process was suspected finding oculomotor palsy, increased muscle tone, rigidity and propulsive fall risk.Acalculia - long standing but now left - right confusion and poverty of speech. Left thumb clumsiness.   All now explained by  MRI abnormality.   Marland Kitchen    MMSE - Mini Mental State Exam 02/08/2018  Orientation to time 1  Orientation to Place 5  Registration 3  Attention/ Calculation 0  Recall 1  Language- name 2 objects 2  Language- repeat 0  Language- follow 3 step command 3  Language- read & follow direction 1  Write a sentence 0  Copy design 0  Total score 16     Plan:  Treatment plan and additional workup :  Eye-movement abnormalities now most like associated with the gliosis and encephalomalcia and not neurodegeneratve. Vascular dementia.  Slowed EEG,   abnormal gliosis or encephalomalacia in MRI - could this be an AVM? Fall related head trauma? Carotid vaso-vascular embolism?   Need MRA brain, non contrast.  Need carotid doppler first. PT, OT and ST to continue.     Rv in 2-3 month with me.    Porfirio Mylar Tonianne Fine MD 04/12/2018

## 2018-04-13 LAB — COMPREHENSIVE METABOLIC PANEL
ALK PHOS: 71 IU/L (ref 39–117)
ALT: 20 IU/L (ref 0–32)
AST: 21 IU/L (ref 0–40)
Albumin/Globulin Ratio: 1.3 (ref 1.2–2.2)
Albumin: 4 g/dL (ref 3.7–4.7)
BUN/Creatinine Ratio: 14 (ref 12–28)
BUN: 14 mg/dL (ref 8–27)
Bilirubin Total: 0.3 mg/dL (ref 0.0–1.2)
CO2: 20 mmol/L (ref 20–29)
Calcium: 9.5 mg/dL (ref 8.7–10.3)
Chloride: 104 mmol/L (ref 96–106)
Creatinine, Ser: 0.99 mg/dL (ref 0.57–1.00)
GFR calc Af Amer: 63 mL/min/{1.73_m2} (ref >59)
GFR calc non Af Amer: 55 mL/min/{1.73_m2} — ABNORMAL LOW (ref >59)
GLOBULIN, TOTAL: 3 g/dL (ref 1.5–4.5)
Glucose: 132 mg/dL — ABNORMAL HIGH (ref 65–99)
Potassium: 3.9 mmol/L (ref 3.5–5.2)
SODIUM: 141 mmol/L (ref 134–144)
Total Protein: 7 g/dL (ref 6.0–8.5)

## 2018-04-13 LAB — C-REACTIVE PROTEIN: CRP: 2 mg/L (ref 0–10)

## 2018-04-13 LAB — SEDIMENTATION RATE: Sed Rate: 17 mm/hr (ref 0–40)

## 2018-04-14 ENCOUNTER — Encounter: Payer: Self-pay | Admitting: Neurology

## 2018-04-14 ENCOUNTER — Other Ambulatory Visit: Payer: Self-pay | Admitting: Neurology

## 2018-04-14 DIAGNOSIS — F0789 Other personality and behavioral disorders due to known physiological condition: Secondary | ICD-10-CM

## 2018-04-14 DIAGNOSIS — R4181 Age-related cognitive decline: Secondary | ICD-10-CM

## 2018-04-14 DIAGNOSIS — R930 Abnormal findings on diagnostic imaging of skull and head, not elsewhere classified: Secondary | ICD-10-CM

## 2018-04-14 DIAGNOSIS — H519 Unspecified disorder of binocular movement: Secondary | ICD-10-CM

## 2018-04-14 DIAGNOSIS — F09 Unspecified mental disorder due to known physiological condition: Secondary | ICD-10-CM

## 2018-04-14 DIAGNOSIS — G9389 Other specified disorders of brain: Secondary | ICD-10-CM

## 2018-04-19 ENCOUNTER — Ambulatory Visit (HOSPITAL_COMMUNITY): Payer: Medicare Other

## 2018-04-20 ENCOUNTER — Other Ambulatory Visit: Payer: Self-pay | Admitting: Internal Medicine

## 2018-05-09 ENCOUNTER — Other Ambulatory Visit: Payer: Self-pay | Admitting: Internal Medicine

## 2018-07-03 ENCOUNTER — Other Ambulatory Visit: Payer: Self-pay | Admitting: Internal Medicine

## 2018-07-28 ENCOUNTER — Encounter: Payer: Self-pay | Admitting: Neurology

## 2018-07-28 ENCOUNTER — Telehealth: Payer: Self-pay | Admitting: Neurology

## 2018-07-28 NOTE — Telephone Encounter (Signed)
Called the patient to inform them that our office has placed new protocols in place for our office visits. Due to Covid 19 our office is reducing our number of office visits in order to minimize the risk to our patients and healthcare providers.Our office is now providing the capability to offer the patients virtual visits at this time. Informed of what that process looks like and informed that the Virtual visit will still be billed through insurance as such. Due to [place],informed the patient since the appointment is taking place over the phone/internet app, we can't guarantee the security of the phone line. With that said if we do move forward I would have to get verbal consent to complete the Video Visit/Phone call. Patient gave verbal consent to move forward with the video visit. I have reviewed the patient's chart and made sure that everything is up to date. Patient is also made aware that since this is a video visit we are able to complete the visit but a physical exam is not able to be done since the patient is not present in person. Pt request the link be texted/emailed to them. Pt informed that the front staff will contact the patient aprox 30 minutes prior to the scheduled appointment to "check them in" and make sure that everything is ready for the appointment to get started. Pt verbalized understanding of this information and will states to be ready for the visit at least 15-30 min prior to the visit. Reminded the patient once more that this is treated as a Office visit and the patient must be prepared for the visit and ready at the time of their appointment preferably in a well lit area where they have good connection for the visit. Pt verbalized understanding. I have sent the email for the visit to  [email or web address]

## 2018-08-08 ENCOUNTER — Ambulatory Visit: Payer: Medicare Other | Admitting: Neurology

## 2018-09-07 ENCOUNTER — Other Ambulatory Visit: Payer: Self-pay | Admitting: Internal Medicine

## 2018-09-07 NOTE — Telephone Encounter (Signed)
Pt needs virtual visit

## 2018-09-07 NOTE — Progress Notes (Signed)
Virtual Visit via Video Note  I connected with@ on 09/09/18 at 10:00 AM EDT by a video enabled telemedicine application and verified that I am speaking with the correct person using two identifiers. Location patient: home Location provider  Home  office Persons participating in the virtual visit: patient, provider and spouse   [place] national recommendations  regarding COVID 19 pandemic   video visit is advised over in office visit for this patient.  Patient aware  of the limitations of evaluation and management by telemedicine and  availability of in person appointments. and agreed to proceed.   HPI: Pamela ApleySandra Alvarez presents for video visit  Last seen by me 2 2019   Has hypertension and needs refill losartan 25 . bp readings are in range in 120/80 range and no syncope se noted of  Med.  No cp sob  Cognitive issues under  eval  Delayed cause of covid shiut down but doing ok .  ROS: See pertinent positives and negatives per HPI. Has gait disturbance   No recent injury   Past Medical History:  Diagnosis Date  . Cyst 07-2008   Cyst on back x2 that were drained  . H/O echocardiogram 02-2000  . Hx of colonic polyps   . Hyperlipidemia   . Hypertension   . Varicose veins of both lower extremities     Past Surgical History:  Procedure Laterality Date  . POLYPECTOMY     Colon  . TONSILLECTOMY    . TUBAL LIGATION      Family History  Problem Relation Age of Onset  . Hypertension Mother   . Parkinsonism Mother   . Angina Father   . Heart disease Father   . Colon cancer Father   . Lung cancer Father   . Skin cancer Father   . Cancer Father        Heart  . Breast cancer Sister        over 1550  . Diabetes Sister   . Heart disease Brother 1455       triple bypass surgery  . Macular degeneration Sister   . Diabetes Sister        prediabetic  . Hearing loss Sister        hearing problems  . Vasculitis Sister     Social History   Tobacco Use  . Smoking status: Never Smoker  .  Smokeless tobacco: Never Used  Substance Use Topics  . Alcohol use: Yes    Alcohol/week: 7.0 standard drinks    Types: 7 Glasses of wine per week    Comment: one a day  . Drug use: Not Currently      Current Outpatient Medications:  .  cyanocobalamin 1000 MCG tablet, Take 1,000 mcg by mouth daily., Disp: , Rfl:  .  losartan (COZAAR) 25 MG tablet, Take 1 tablet (25 mg total) by mouth daily., Disp: 90 tablet, Rfl: 2  EXAM: BP Readings from Last 3 Encounters:  04/12/18 (!) 168/88  03/14/18 (!) 150/80  02/08/18 (!) 179/76    VITALS per patient if applicable:  BP124/74 pulse 80 GENERAL: alert, oriented, appears well and in no acute distress HEENT: atraumatic, conjunttiva clear, no obvious abnormalities on inspection of external nose and ears NECK: normal movements of the head and neck LUNGS: on inspection no signs of respiratory distress, breathing rate appears normal, no obvious gross SOB, gasping or wheezing CV: no obvious cyanosis MS: moves all visible extremities without noticeable abnormality PSYCH/NEURO: pleasant and cooperative,memory not tested .  No anxiety noted.  Lab Results  Component Value Date   WBC 7.9 04/22/2017   HGB 14.5 04/22/2017   HCT 43.5 04/22/2017   PLT 274.0 04/22/2017   GLUCOSE 132 (H) 04/12/2018   CHOL 233 (H) 04/22/2017   TRIG 114.0 04/22/2017   HDL 71.60 04/22/2017   LDLDIRECT 191.8 07/22/2012   LDLCALC 139 (H) 04/22/2017   ALT 20 04/12/2018   AST 21 04/12/2018   NA 141 04/12/2018   K 3.9 04/12/2018   CL 104 04/12/2018   CREATININE 0.99 04/12/2018   BUN 14 04/12/2018   CO2 20 04/12/2018   TSH 2.30 04/22/2017    ASSESSMENT AND PLAN:  Discussed the following assessment and plan:    ICD-10-CM   1. Essential hypertension  I10   2. Medication management  Z79.899   3. Elevated blood sugar  R73.9    non fasting will check  fbs  at home and send in  can fu at cpx   Above lab not fasting   Will check fbs at home  and send in reading    Refill losartan today and plan cpx in future  appt   Counseled.   Expectant management and discussion of plan and treatment with opportunity to ask questions and all were answered. The patient agreed with the plan and demonstrated an understanding of the instructions.   Advised to call back or seek an in-person evaluation if worsening  or having  further concerns .  [name], MD

## 2018-09-08 ENCOUNTER — Other Ambulatory Visit: Payer: Self-pay

## 2018-09-08 ENCOUNTER — Encounter: Payer: Self-pay | Admitting: Internal Medicine

## 2018-09-08 ENCOUNTER — Ambulatory Visit (INDEPENDENT_AMBULATORY_CARE_PROVIDER_SITE_OTHER): Payer: Medicare Other | Admitting: Internal Medicine

## 2018-09-08 DIAGNOSIS — Z79899 Other long term (current) drug therapy: Secondary | ICD-10-CM | POA: Diagnosis not present

## 2018-09-08 DIAGNOSIS — I10 Essential (primary) hypertension: Secondary | ICD-10-CM

## 2018-09-08 DIAGNOSIS — R739 Hyperglycemia, unspecified: Secondary | ICD-10-CM | POA: Diagnosis not present

## 2018-09-08 MED ORDER — LOSARTAN POTASSIUM 25 MG PO TABS: 25.0000 mg | ORAL_TABLET | Freq: Every day | ORAL | 2 refills | Status: DC

## 2018-10-27 ENCOUNTER — Telehealth: Payer: Self-pay | Admitting: Neurology

## 2018-10-27 ENCOUNTER — Other Ambulatory Visit: Payer: Self-pay

## 2018-10-27 ENCOUNTER — Encounter: Payer: Self-pay | Admitting: Neurology

## 2018-10-27 ENCOUNTER — Encounter

## 2018-10-27 ENCOUNTER — Ambulatory Visit (INDEPENDENT_AMBULATORY_CARE_PROVIDER_SITE_OTHER): Payer: Medicare Other | Admitting: Neurology

## 2018-10-27 VITALS — BP 177/92 | HR 106 | Temp 98.7°F | Ht 64.0 in | Wt 149.0 lb

## 2018-10-27 DIAGNOSIS — R269 Unspecified abnormalities of gait and mobility: Secondary | ICD-10-CM

## 2018-10-27 DIAGNOSIS — R4189 Other symptoms and signs involving cognitive functions and awareness: Secondary | ICD-10-CM

## 2018-10-27 DIAGNOSIS — R200 Anesthesia of skin: Secondary | ICD-10-CM

## 2018-10-27 DIAGNOSIS — S098XXA Other specified injuries of head, initial encounter: Secondary | ICD-10-CM

## 2018-10-27 DIAGNOSIS — R41841 Cognitive communication deficit: Secondary | ICD-10-CM | POA: Diagnosis not present

## 2018-10-27 DIAGNOSIS — G467 Other lacunar syndromes: Secondary | ICD-10-CM | POA: Diagnosis not present

## 2018-10-27 DIAGNOSIS — R208 Other disturbances of skin sensation: Secondary | ICD-10-CM | POA: Diagnosis not present

## 2018-10-27 DIAGNOSIS — G231 Progressive supranuclear ophthalmoplegia [Steele-Richardson-Olszewski]: Secondary | ICD-10-CM

## 2018-10-27 DIAGNOSIS — R4701 Aphasia: Secondary | ICD-10-CM

## 2018-10-27 DIAGNOSIS — R296 Repeated falls: Secondary | ICD-10-CM

## 2018-10-27 NOTE — Telephone Encounter (Signed)
[employer] medicare order sent to GI. No auth they will reach out to the patient to schedule.  

## 2018-10-27 NOTE — Progress Notes (Signed)
Provider:  Melvyn Novasarmen  Cookie Pore, MD    Referring Provider: Madelin HeadingsPanosh, [name], MD Primary Care Physician:  Madelin HeadingsPanosh, [name], MD  Chief Complaint  Patient presents with  . Follow-up    pt with husband, rm 11. states symptoms have worsened since here last. has difficulty with expressive aphasia and balance and fine motor skills    HPI: Dr. Lorie ApleySandra Bayles, [name]  is a 78 y.o. female patient, seen here on 02-08-2018 in a referral from Dr. [name] for a "second opinion " in the work up for memory loss.  The patient reports having been stumped by the previously see neurologist in [place], who reportedly diagnosed her with Alzheimer's Dementia. 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 She has a [name] and reports she had some learning disability - not learning or memory related, but an inability to calculate.   Dr. Early Charsill had always been acute aware of her learning abilities, but over the last couple of years there has been evidence of a declining cognitive ability.  The patient broke her ankle at the time and while she recovered from this supposedly only bony injury there were several changes, she felt always cold it was as if her enough thermostat had changed, she also noted and memory decline, may be a personality change, she had trouble retrieving words and her vocabulary became progressively restricted, her speech poor. She has ever since gait and balance impairment. She is in PT twice weekly.   Family history - mother had PD.father had CAD, died at 79 years old, cancer.  Social history- [name], no history of tobacco , ETOH- used to have one glass of wine with dinner, now 2-3 days a week.  No  recreatinonal drugs. 2 sons , aged 79 and 5555.    RV 04-12-2018,  Plan: Eye-movement abnormalities now most like associated with the gliosis and encephalomalcia and not neurodegeneratve. Vascular dementia.  Slowed EEG,  Abnormal gliosis or encephalomalacia in MRI - could this be an  AVM? Fall related head trauma? Carotid vaso-vascular embolism?   Need MRA brain, non contrast.  Need carotid doppler first. PT, OT and ST to continue.  Rv in 2-3 month with me.   RV 10-27-2018, Rv  Due to the Coronavirus pandemia none of above tests were performed.  I explained again that the MRA is non contrast, no invasive.  New orders for ST, PT and OT will be needed. Her left hand is dominant and she has become very clumsy.      [facility]  NEUROIMAGING REPORT   STUDY DATE: 03/15/18 PATIENT NAME: Pamela ApleySandra Alvarez DOB: 12/23/1940 MRN: [id number]  ORDERING CLINICIAN: Melvyn Novasarmen Lizvette Lightsey, MD  CLINICAL HISTORY: 78 year old female with memory loss and confusion.  EXAM: MRI brain (without)  TECHNIQUE: MRI of the brain without contrast was obtained utilizing 5 mm axial slices with T1, T2, T2 flair, SWI and diffusion weighted views.  T1 sagittal and T2 coronal views were obtained. CONTRAST: no (patient declined IV contrast) COMPARISON: none  [facility] [address] (1.5 Tesla MRI)    FINDINGS:   Abnormal T2 FLAIR hyperintense signal in the right frontal, right parietal and right cerebellar regions, with encephalomalacia and gliosis.  However there also appears to be underlying heterogeneous lesions with surrounding vasogenic edema.  On SWI views, there is increased susceptibility within the rim of these regions as well indicating mineralization or hemosiderin deposition.  Considerations would include embolic  chronic ischemic infarcts, although underlying neoplasm (metastases) cannot be excluded.   Elsewhere few punctate foci of nonspecific gliosis in the subcortical white matter.  No abnormal lesions are seen on diffusion-weighted views to suggest acute ischemia. The cortical sulci, fissures and cisterns are normal in size and appearance. Lateral, third and fourth ventricle are normal in size and appearance. No extra-axial fluid  collections are seen. No evidence of mass effect or midline shift.    On sagittal views the posterior fossa, pituitary gland and corpus callosum are unremarkable. No evidence of intracranial hemorrhage on SWI views. The orbits and their contents, paranasal sinuses and calvarium are unremarkable.  Intracranial flow voids are present.   IMPRESSION:  Abnormal MRI brain (without) demonstrating: - Right frontal, right parietal and right cerebellar encephalomalacia and gliosis.  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  Underlying neoplastic or vascular lesions cannot be excluded, and would recommend follow-up imaging with postcontrast views. - Mild chronic small vessel ischemic disease. - No acute findings.   INTERPRETING PHYSICIAN:  Pamela R. PENUMALLI, MD Pamela Markerertified in Neurology, Neurophysiology and Neuroimaging    Review of Systems: Out of a complete 14 system review, the patient complains of only the following symptoms, and all other reviewed systems are negative.  Patient had several falls, once hit the back of the head,  Had a bid goose egg- ED did not image her.  now right encephalomalcia may be related- her cognitive , coordination and speech declined after the fall.. .  Poverty of speech in a patient with a [name] degree , stiff gait, left-right confusion acalculia.  Left thumb sensoryloss, clumsy hand.  No Dysphagia. Mild Dysphonia. No loss of smell or taste.     Social History   Socioeconomic History  . Marital status: Married    Spouse name: Virl DiamondChuck  . Number of children: 2  . Years of education: Not on file  . Highest education level: Doctorate  Occupational History  . Occupation: retired    Comment: Actorchool psychologist [name]  Social Needs  . Financial resource strain: Not on file  . Food insecurity    Worry: Not on file    Inability: Not on file  . Transportation needs    Medical: Not on file    Non-medical: Not on file  Tobacco Use  . Smoking  status: Never Smoker  . Smokeless tobacco: Never Used  Substance and Sexual Activity  . Alcohol use: Yes    Alcohol/week: 7.0 standard drinks    Types: 7 Glasses of wine per week    Comment: one a day  . Drug use: Not Currently  . Sexual activity: Not on file  Lifestyle  . Physical activity    Days per week: Not on file    Minutes per session: Not on file  . Stress: Not on file  Relationships  . Social Musicianconnections    Talks on phone: Not on file    Gets together: Not on file    Attends religious service: Not on file    Active member of club or organization: Not on file    Attends meetings of clubs or organizations: Not on file    Relationship status: Not on file  . Intimate partner violence    Fear of current or ex partner: Not on file    Emotionally abused: Not on file    Physically abused: Not on file    Forced sexual activity: Not on file  Other Topics Concern  . Not on file  Social History Narrative   Regular exercise-yes   Moved back to [place] from [place] of 2 cat   G3 P2    Exercises walking regu;arly    Retired  Teaching laboratory technician   Drinks one cup of coffee  A day. In addition to walking QD she and her husband take a pilates class on Thursdays. She lives with her husband in a 2 story house, though the master bedroom in on the first level.        Family History  Problem Relation Age of Onset  . Hypertension Mother   . Parkinsonism Mother   . Angina Father   . Heart disease Father   . Colon cancer Father   . Lung cancer Father   . Skin cancer Father   . Cancer Father        Heart  . Breast cancer Sister        over 33  . Diabetes Sister   . Heart disease Brother 75       triple bypass surgery  . Macular degeneration Sister   . Diabetes Sister        prediabetic  . Hearing loss Sister        hearing problems  . Vasculitis Sister     Past Medical History:  Diagnosis Date  . Cyst 07-2008   Cyst on back x2 that were drained  . H/O echocardiogram  02-2000  . Hx of colonic polyps   . Hyperlipidemia   . Hypertension   . Varicose veins of both lower extremities     Past Surgical History:  Procedure Laterality Date  . POLYPECTOMY     Colon  . TONSILLECTOMY    . TUBAL LIGATION      Current Outpatient Medications  Medication Sig Dispense Refill  . atorvastatin (LIPITOR) 10 MG tablet Take 10 mg by mouth daily.    . cyanocobalamin 1000 MCG tablet Take 1,000 mcg by mouth daily.    Marland Kitchen losartan (COZAAR) 25 MG tablet Take 1 tablet (25 mg total) by mouth daily. 90 tablet 2   No current facility-administered medications for this visit.     Allergies as of 10/27/2018 - Review Complete 10/27/2018  Allergen Reaction Noted  . Dust mite extract Cough 12/09/2015  . Sulfonamide derivatives      Vitals: BP (!) 177/92   Pulse (!) 106   Temp 98.7 F (37.1 C)   Ht 5\' 4"  (1.626 m)   Wt 149 lb (67.6 kg)   BMI 25.58 kg/m  Last Weight:  Wt Readings from Last 1 Encounters:  10/27/18 149 lb (67.6 kg)   Last Height:   Ht Readings from Last 1 Encounters:  10/27/18 5\' 4"  (1.626 m)    Physical exam:  General: The patient is awake, alert and appears not in acute distress. The patient is well groomed. Head: Normocephalic, atraumatic. Neck is supple. Cardiovascular:  Regular rate and rhythm , without  murmurs or carotid bruit, and without distended neck veins. Respiratory: Lungs are clear to auscultation. Skin:  Without evidence of edema, or rash Trunk: BMI is 27.1 kg/m2  elevated and patient  has normal posture.  Neurologic exam : The patient is awake and alert, oriented to place and time.  Memory subjective described as impaired  There is an ab- normal attention span & concentration ability. Speech is non -fluent with aphasia. Mood and affect are meek.  Needs memory testing.  MMSE - Mini Mental State Exam 02/08/2018  Orientation  to time 1  Orientation to Place 5  Registration 3  Attention/ Calculation 0  Recall 1  Language- name 2  objects 2  Language- repeat 0  Language- follow 3 step command 3  Language- read & follow direction 1  Write a sentence 0  Copy design 0  Total score 16    Cranial nerves: Pupils are equal and briskly reactive to light.  Funduscopic exam without  evidence of pallor or edema. Extraocular movements  in vertical planes restricted, neither able to pursuit upwards not downwards gaze-  and horizontal planes with sharp, coarse saccades, skipping the central vision without nystagmus.  Reduced, rare blink -   Confirmed twice that she cannot follow with downward gaze, but she wa able -delayed- to lok up.  Visual fields by finger perimetry are restricted  Hearing to finger rub intact.   Facial sensation intact to fine touch. Facial motor strength is symmetric, but reduced facial mimic.  Tongue and uvula move midline. Tongue protrusion into either cheek is normal. Shoulder shrug is normal.   Motor exam: elevated tone, symmetric muscle bulk and symmetric strength in all extremities.  Sensory:  Fine touch, pinprick and vibration were tested in all extremities. She seems to have higher sensitivity on the left, she did not show extinction.  Proprioception was normal.  Coordination: Rapid alternating movements in the fingers/hands were normal. Finger-to-nose maneuver  normal without evidence of ataxia, dysmetria or tremor.  Gait and station: Patient walks without assistive device and is able unassisted to climb up to the exam table.  Strength within normal limits, but gait appears stiff, no rotation - Stance is stable and normal. Tandem gait was deferred.  Romberg testing is negative   Deep tendon reflexes: in the upper and lower extremities are symmetric and intact.  Babinski maneuver response is  downgoing.  Assessment:  After physical and neurologic examination, review of laboratory studies, imaging, neurophysiology testing and pre-existing records, assessment is that of :   Neurodegenerative  disease process versus vascular injury to CNS - gliosis confirms the trauma is most likely the cause- see MRI brain.  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   Left thumb clumsiness.      Marland Kitchen.    MMSE - Mini Mental State Exam 02/08/2018  Orientation to time 1  Orientation to Place 5  Registration 3  Attention/ Calculation 0  Recall 1  Language- name 2 objects 2  Language- repeat 0  Language- follow 3 step command 3  Language- read & follow direction 1  Write a sentence 0  Copy design 0  Total score 16     Plan:  Treatment plan and additional workup :  RV 10-27-2018, Rv  Due to the Coronavirus pandemia none of above tests were performed.  I explained again that the MRA is non contrast, no invasive.  New orders for ST, PT and OT will be needed. Her left hand is dominant and she has become very clumsy on the left, but also feels a stronger sensitivity to fine touch and pin prick.     Rv in 2 month with NP, but I will be available for consultation.  Porfirio Mylararmen Amun Stemm MD 10/27/2018

## 2018-10-29 ENCOUNTER — Ambulatory Visit
Admission: RE | Admit: 2018-10-29 | Discharge: 2018-10-29 | Disposition: A | Discharge status: Home / Self Care | Payer: Medicare Other | Source: Ambulatory Visit | Attending: Neurology | Admitting: Neurology

## 2018-10-29 ENCOUNTER — Other Ambulatory Visit: Payer: Self-pay

## 2018-10-29 DIAGNOSIS — R200 Anesthesia of skin: Secondary | ICD-10-CM

## 2018-10-29 DIAGNOSIS — R41841 Cognitive communication deficit: Secondary | ICD-10-CM | POA: Diagnosis not present

## 2018-10-29 DIAGNOSIS — R4189 Other symptoms and signs involving cognitive functions and awareness: Secondary | ICD-10-CM

## 2018-10-29 DIAGNOSIS — R269 Unspecified abnormalities of gait and mobility: Secondary | ICD-10-CM | POA: Diagnosis not present

## 2018-10-29 DIAGNOSIS — R4701 Aphasia: Secondary | ICD-10-CM

## 2018-11-01 ENCOUNTER — Telehealth: Payer: Self-pay | Admitting: Neurology

## 2018-11-01 ENCOUNTER — Encounter: Payer: Self-pay | Admitting: Neurology

## 2018-11-01 NOTE — Telephone Encounter (Signed)
-----   Message from Larey Seat, MD sent at 10/30/2018  1:46 PM EDT ----- No aneurysms were identified.   IMPRESSION: This MR angiogram of the intracerebral arteries shows the  following: 1.  Reduced flow is noted in the distal left vertebral artery.  This could  be due to stenosis of the V4 segment or pre-cerebral stenosis more  proximally..  If clinically indicated, consider a contrasted MR angiogram  of the neck arteries to further evaluate. 2.  Mild stenosis of the proximal basilar artery.   3.  Mild stenosis of the left posterior cerebral artery which has a fetal  origin (variant).   The vascular study did not explain the vertigo

## 2018-11-01 NOTE — Telephone Encounter (Signed)
Called the pt to review the results. No answer. LVM informing I will send a mychart message and for them to call with any questions.

## 2018-11-22 ENCOUNTER — Ambulatory Visit: Payer: Medicare Other

## 2018-11-22 ENCOUNTER — Ambulatory Visit: Payer: Medicare Other | Admitting: Physical Therapy

## 2018-11-22 ENCOUNTER — Other Ambulatory Visit: Payer: Self-pay

## 2018-11-22 ENCOUNTER — Encounter: Payer: Self-pay | Admitting: Physical Therapy

## 2018-11-22 ENCOUNTER — Ambulatory Visit: Payer: Medicare Other | Attending: Neurology | Admitting: Occupational Therapy

## 2018-11-22 DIAGNOSIS — R278 Other lack of coordination: Secondary | ICD-10-CM | POA: Insufficient documentation

## 2018-11-22 DIAGNOSIS — R41844 Frontal lobe and executive function deficit: Secondary | ICD-10-CM | POA: Diagnosis present

## 2018-11-22 DIAGNOSIS — R41840 Attention and concentration deficit: Secondary | ICD-10-CM | POA: Diagnosis present

## 2018-11-22 DIAGNOSIS — R2689 Other abnormalities of gait and mobility: Secondary | ICD-10-CM

## 2018-11-22 DIAGNOSIS — R41841 Cognitive communication deficit: Secondary | ICD-10-CM

## 2018-11-22 DIAGNOSIS — R482 Apraxia: Secondary | ICD-10-CM | POA: Diagnosis present

## 2018-11-22 DIAGNOSIS — M6281 Muscle weakness (generalized): Secondary | ICD-10-CM

## 2018-11-22 DIAGNOSIS — R2681 Unsteadiness on feet: Secondary | ICD-10-CM | POA: Insufficient documentation

## 2018-11-22 DIAGNOSIS — R41842 Visuospatial deficit: Secondary | ICD-10-CM | POA: Diagnosis present

## 2018-11-22 DIAGNOSIS — R4701 Aphasia: Secondary | ICD-10-CM | POA: Insufficient documentation

## 2018-11-22 NOTE — Therapy (Signed)
[facility] [address] [place], [place], [address] Phone: [phone]   Fax:  [phone]  Physical Therapy Evaluation  Patient Details  Name: Pamela Alvarez MRN: [id number] Date of Birth: 10/15/1940 Referring Provider (PT): Dohmeier, Porfirio Mylar   Encounter Date: 11/22/2018  CLINIC OPERATION CHANGES: Outpatient Neuro Rehab is open at lower capacity following universal masking, social distancing, and patient screening.  The patient's COVID risk of complications score is 3.    PT End of Session - 11/22/18 1048    Visit Number  1    Number of Visits  13    Date for PT Re-Evaluation  01/21/19    Authorization Type  [employer] Medicare-will need 10th visit progress note    PT Start Time  0805    PT Stop Time  0845    PT Time Calculation (min)  40 min    Equipment Utilized During Treatment  Gait belt    Activity Tolerance  Patient tolerated treatment well    Behavior During Therapy  Impulsive   Impulsive with transfers, does not wait for directions for objective tests      Past Medical History:  Diagnosis Date  . Cyst 07-2008   Cyst on back x2 that were drained  . H/O echocardiogram 02-2000  . Hx of colonic polyps   . Hyperlipidemia   . Hypertension   . Varicose veins of both lower extremities     Past Surgical History:  Procedure Laterality Date  . POLYPECTOMY     Colon  . TONSILLECTOMY    . TUBAL LIGATION      There were no vitals filed for this visit.   Subjective Assessment - 11/22/18 0810    Subjective  Husband provides most history.  Larey Seat about 3 years ago and fractured ankle, with additional fall 2 years ago, with possible TBI resulting from hitting head from fall.  Pt has fallen twice in past month, one fall yesterday.  One fall was going down stairs.  Needs assistance with getting up from the floor.  Have two walkers at home [facility] and RW), but doesn't use much.    Patient is accompained by:  Family member    Husband   Patient Stated Goals  Pt's goal for therapy is better balance and walking.    Currently in Pain?  No/denies         [facility] PT Assessment - 11/22/18 0815      Assessment   Medical Diagnosis  Gait abnormality, recurrent falls    Referring Provider (PT)  Dohmeier, Porfirio Mylar    Onset Date/Surgical Date  10/27/18    Hand Dominance  Left      Precautions   Precautions  Fall      Balance Screen   Has the patient fallen in the past 6 months  Yes    How many times?  6-7    Has the patient had a decrease in activity level because of a fear of falling?   Yes    Is the patient reluctant to leave their home because of a fear of falling?   Yes      Home Environment   Living Environment  Private residence    Living Arrangements  Spouse/significant other    Available Help at Discharge  Family    Type of Home  House    Home Access  Stairs to enter    Entrance Stairs-Number of Steps  2    Entrance Stairs-Rails  Right;Left  Home Layout  Two level;Able to live on main level with bedroom/bathroom    [place] - 2 wheels;Grab bars - toilet;Grab bars - tub/shower;Shower seat   Stair lift up to second floor; UpWalker     Prior Function   Level of Independence  Needs assistance with gait    Leisure  Pretty sedentary; will go for rides, [employer] 2x/wk      Observation/Other Assessments   Focus on Therapeutic Outcomes (FOTO)   NA      Posture/Postural Control   Posture/Postural Control  Postural limitations    Postural Limitations  Forward head      ROM / Strength   AROM / PROM / Strength  Strength      Strength   Overall Strength  Deficits    Overall Strength Comments  Difficulty following directions for MMT, grossly tested at least 3+/5 through BLEs      Transfers   Transfers  Sit to Stand;Stand to Sit    Sit to Stand  5: Supervision;With upper extremity assist;From chair/3-in-1    Stand to Sit  5: Supervision;With upper extremity assist;To  chair/3-in-1;Uncontrolled descent    Comments  Husband has to assist to get up most of the time      Ambulation/Gait   Ambulation/Gait  Yes    Ambulation/Gait Assistance  4: Min guard   HHA   Ambulation Distance (Feet)  150 Feet    Assistive device  Other (Comment)   HHA   Gait Pattern  Step-to pattern;Step-through pattern;Decreased arm swing - right;Decreased arm swing - left;Decreased step length - right;Decreased step length - left;Shuffle    Ambulation Surface  Level;Indoor    Gait velocity  24.66 sec = 1.33 ft/sec    Gait Comments  Pt has difficulty with retro gait to back up to chair, needing min/mod assist for safety      Standardized Balance Assessment   Standardized Balance Assessment  Timed Up and Go Test;Berg Balance Test      Berg Balance Test   Sit to Stand  Able to stand  independently using hands    Standing Unsupported  Able to stand 2 minutes with supervision    Sitting with Back Unsupported but Feet Supported on Floor or Stool  Able to sit safely and securely 2 minutes    Stand to Sit  Sits independently, has uncontrolled descent    Transfers  Able to transfer with verbal cueing and /or supervision    Standing Unsupported with Eyes Closed  Able to stand 10 seconds with supervision    Standing Unsupported with Feet Together  Needs help to attain position but able to stand for 30 seconds with feet together    From Standing, Reach Forward with Outstretched Arm  Loses balance while trying/requires external support    From Standing Position, Pick up Object from Floor  Unable to try/needs assist to keep balance    From Standing Position, Turn to Look Behind Over each Shoulder  Needs supervision when turning    Turn 360 Degrees  Needs close supervision or verbal cueing    Standing Unsupported, Alternately Place Feet on Step/Stool  Needs assistance to keep from falling or unable to try    Standing Unsupported, One Foot in Front  Loses balance while stepping or standing     Standing on One Leg  Unable to try or needs assist to prevent fall    Total Score  19    Berg comment:  Scores <  45/56 indicate increased fall risk      Timed Up and Go Test   TUG  Normal TUG    Normal TUG (seconds)  26.44    TUG Comments  Scores >13.5 sec indicate increased fall risk.                Objective measurements completed on examination: See above findings.                PT Short Term Goals - 11/22/18 1257      PT SHORT TERM GOAL #1   Title  Pt will perform HEP with family supervision for improved transfers, balance, and gait.  TARGET 12/16/2018 (may be delayed due to later start with later scheduling)    Time  4    Period  Weeks    Status  New    Target Date  12/16/18      PT SHORT TERM GOAL #2   Title  Pt will perform 4 of 5 reps of sit<>stand transfers with correct technique, with husband supervision and cues, for improved safety with transfers.    Time  4    Period  Weeks    Status  New    Target Date  12/16/18      PT SHORT TERM GOAL #3   Title  Pt will improve Berg Balance score to at least 24/56 for decreased fall risk.    Time  4    Period  Weeks    Status  New    Target Date  12/16/18      PT SHORT TERM GOAL #4   Title  Pt will improve TUG score to less than or equal to 23 seconds for decreased fall risk.    Time  4    Period  Weeks    Status  New    Target Date  12/16/18      PT SHORT TERM GOAL #5   Title  Pt/husband will verbalize understanding of fall prevention in home environment.    Time  4    Period  Weeks    Status  New    Target Date  12/16/18        PT Long Term Goals - 11/22/18 1302      PT LONG TERM GOAL #1   Title  Pt will perform updated HEP for balance, gait, with husband's supervision.  TARGET 12/30/2018 (may be delayed due to delayed scheduling)    Time  6    Period  Weeks    Status  New    Target Date  12/30/18      PT LONG TERM GOAL #2   Title  Pt will improve gait velocity to at  least 1.5  ft/sec with appropriate device and supervision, for improved gait efficiency.    Time  6    Period  Weeks    Status  New    Target Date  12/30/18      PT LONG TERM GOAL #3   Title  Pt/husband will verbalize/demonstrate understanding of appropriate floor>stand transfers for safe fall recovery.    Time  4    Period  Weeks    Status  New    Target Date  12/30/18             Plan - 11/22/18 1049    Clinical Impression Statement  Pt is a 78 year old female who presents to OPPT with history of recurrent falls, with possible TBI history  from a fall.  Pt has several year history of declining mobility and falls, with 6-7 falls in the past 6 months.  She presents with decreased muscle strength, decreased balance, decreased gait independence, decreased transfer independence and safety.  She is at fall risk per BERG, TUG, and gait velocity scores. She has supportive husband, and would benefit from skilled PT to address the above stated deficits for improved functional mobility and decreased fall risk.    Personal Factors and Comorbidities  Comorbidity 3+    Comorbidities  PMH includes ankle fracture, HTN, hyperlipidemia, recurrent falls    Examination-Activity Limitations  Squat;Stairs;Stand;Transfers;Locomotion Level    Examination-Participation Restrictions  Community Activity    Stability/Clinical Decision Making  Evolving/Moderate complexity    Clinical Decision Making  Moderate    Rehab Potential  Fair   MMSE score 16/30; good family support   PT Frequency  2x / week    PT Duration  6 weeks   plus eval   PT Treatment/Interventions  ADLs/Self Care Home Management;Gait training;DME Instruction;Neuromuscular re-education;Balance training;Therapeutic exercise;Therapeutic activities;Functional mobility training;Patient/family education    PT Next Visit Plan  Transfer training, initiate HEP-balance, muscle strength; trial of Upwalker vs. RW; safety education will need to be provided to pt and  husband    Consulted and Agree with Plan of Care  Patient;Family member/caregiver    Family Member Consulted  Husband       Patient will benefit from skilled therapeutic intervention in order to improve the following deficits and impairments:  Abnormal gait, Difficulty walking, Decreased safety awareness, Decreased balance, Decreased mobility  Visit Diagnosis: Other abnormalities of gait and mobility  Unsteadiness on feet  Muscle weakness (generalized)     Problem List Patient Active Problem List   Diagnosis Date Noted  . Fluency disorder associated with underlying disease 02/08/2018  . Cognitive and neurobehavioral dysfunction 02/08/2018  . 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 Arthritis of carpometacarpal [facility] joint of left thumb 01/24/2018  . Ankle fracture, left 09/03/2016  . Hand dysfunction 03/11/2016  . Symptomatic varicose veins, bilateral 03/11/2016  . Numbness of left thumb 03/11/2016  . Elevated BP 07/27/2014  . Bunion of left foot 07/26/2014  . Abnormality of gait 07/26/2014  . Cough 11/10/2013  . Post-nasal drainage 11/10/2013  . Abnormal urine odor 11/10/2013  . Visit for preventive health examination 07/28/2013  . Right anterior knee pain 07/22/2012  . Medicare annual wellness visit, subsequent 07/25/2011  . ANXIETY, SITUATIONAL 09/25/2009  . ARTHRITIS 09/25/2009  . Vitamin D deficiency 09/27/2008  . Hyperlipidemia 09/17/2008  . Elevated blood pressure reading without diagnosis of hypertension 09/17/2008  . COLONIC POLYPS, HX OF 09/17/2008  . PERSONAL HISTORY DISEASES SKIN&SUBCUT TISSUE 09/17/2008    Bauer Ausborn W. 11/22/2018, 1:06 PM  Gean MaidensMARRIOTT,Humphrey Guerreiro W., PT  [facility] [address] [place], [place], [address] Phone: [phone]   Fax:  [phone]  Name: Pamela Alvarez MRN: [id number] Date of Birth: 01/06/1941

## 2018-11-22 NOTE — Therapy (Signed)
[facility] [address] [place], [place], [address] Phone: [phone]   Fax:  [phone]  Speech Language Pathology Evaluation  Patient Details  Name: Pamela Alvarez MRN: [id number] Date of Birth: 06-14-1940 Referring Provider (SLP): Dohmeier, Porfirio Mylar, MD   Encounter Date: 11/22/2018  End of Session - 11/22/18 1553    Visit Number  1    Number of Visits  17    Date for SLP Re-Evaluation  02/20/19   90 days   SLP Start Time  0850    SLP Stop Time   0932    SLP Time Calculation (min)  42 min    Activity Tolerance  Patient tolerated treatment well       Past Medical History:  Diagnosis Date  . Cyst 07-2008   Cyst on back x2 that were drained  . H/O echocardiogram 02-2000  . Hx of colonic polyps   . Hyperlipidemia   . Hypertension   . Varicose veins of both lower extremities     Past Surgical History:  Procedure Laterality Date  . POLYPECTOMY     Colon  . TONSILLECTOMY    . TUBAL LIGATION      There were no vitals filed for this visit.  Subjective Assessment - 11/22/18 0855    Subjective  "I can't -- think of - the words I -- - want to say."    Patient is accompained by:  Family member   [name] - husband   Currently in Pain?  No/denies         SLP Evaluation [facility] - 11/22/18 4166      SLP Visit Information   SLP Received On  11/22/18    Referring Provider (SLP)  Dohmeier, Porfirio Mylar, MD    Onset Date  "late 2017"    Medical Diagnosis  Aphasia, Acute cognitive decline      Subjective   Patient/Family Stated Goal  improve speech      General Information   HPI  Complex neurological history - fall down stairs in summer 2017 and pt broke ankle. Late in 2017 pt fell and struck her head on the concrete, which was the start of a gradual decline of spoken langauge and memory. Due to brain imaging done recently, rt frontal, rt parietal, and rt cerebellar encephalomalacia and gliosis were seen, leading MD to  believe decline likely due to trauma/TBI from late 2017. Pt arrives today for ST for aphasia.      Prior Functional Status   Cognitive/Linguistic Baseline  Within functional limits   prior to late 2017, slow decline since    Lives With  Spouse    Vocation  Retired   pt was Programmer, systems with multiple advanced degrees     Cognition   Overall Cognitive Status  Impaired/Different from baseline    Area of Impairment  Safety/judgement   full cognitive eval may need to follow later date   Behaviors  Impulsive   mild seen with arising/sitting     Auditory Comprehension   Overall Auditory Comprehension  Impaired    Overall Auditory Comprehension Comments  Husband reports pt demonstrates left/right confusion during commands, occasionally/usually. Full language eval to follow, suspect at least mild receptive language deficits.       Verbal Expression   Overall Verbal Expression  Impaired    Naming  Impairment    Confrontation  --   80% (boston naming - short form)   Other Naming Comments  Short form of [employer]  Test 12/15 (80%)    Verbal Errors  Semantic paraphasias;Aware of errors;Not aware of errors    Other Verbal Expression Comments  Pt with consistent halting speech, more severe re: pause time, and more frequent with spontaneous speech than when pt reading.  More semantic paraphasias when reading than in spontaneous speech (e.g., "30"/93, "talks"/thinks, "walk"/talk, "we"/you)      Oral Motor/Sensory Function   Overall Oral Motor/Sensory Function  Other (comment)   not performed due to clinic masking policy     Motor Speech   Overall Motor Speech  Other (comment)   cannot completely be ruled out due to halting speech   Motor Planning  --   ?, due to halting speech                     SLP Education - 11/22/18 1552    Education Details  what is aphasia, app/website for practice, prognosis is worse due to time post onset    Person(s) Educated  Patient;Spouse     Methods  Explanation;Handout    Comprehension  Verbalized understanding;Need further instruction       SLP Short Term Goals - 11/22/18 1601      SLP SHORT TERM GOAL #1   Title  pt will demo ability for functionalality with simple household langauge tasks over 3 sessions    Time  4    Period  Weeks    Status  New      SLP SHORT TERM GOAL #2   Title  pt will achieve functional langauge production in 10 minutes simple conversation with modifed independence (compensations) over three sessions    Time  4    Period  Weeks    Status  New      SLP SHORT TERM GOAL #3   Title  pt will demo correct right/left comprehension in simple functional commands 9/10 opportunities    Time  4    Period  Weeks    Status  New      SLP SHORT TERM GOAL #4   Title  pt will complete full aphasia eval before total visit #3    Time  2    Period  Weeks    Status  New      SLP SHORT TERM GOAL #5   Title  pt will complete cognitive linguistic eval PRN prior to total visit #7    Time  3    Period  Weeks    Status  New       SLP Long Term Goals - 11/22/18 1609      SLP LONG TERM GOAL #1   Title  pt will demonstrate improved verbal communication (as rated by pt) compared to when ST began    Time  8    Period  Weeks    Status  New      SLP LONG TERM GOAL #2   Title  pt will report speech and language is less frustrating than prior to initiation of ST    Time  8    Period  Weeks    Status  New      SLP LONG TERM GOAL #3   Title  pt will produce functional verbal expression in 10 minutes simple to mod complex conversation over 3 sessions    Time  8    Period  Weeks    Status  New       Plan - 11/22/18 1553    Clinical  Impression Statement  Pt presents today with significant expressive aphasia with paraphasias during reading, and naming assessment. Halting speech consistently noted, worse in spontaneous speech than in reading but yet still evident in reading. Cannot copmletely rule out presence  of verbal apraxia or cognitive communication deficits so SLP will include these diagnoses. Copmrehensive aphasia eval to follow, full cognitive eval may follow. Strange presentation, as we would expect language skills to have improved initially with spontaneous recovery from TBI, yet husband reports skills in this area have declined consistently since pt's fall where she struck concrete. SLP recommends a course of skilled ST to address pt's decr'd language-cogniton ability/possible verbal apraxia.to incr. pt independence.    Speech Therapy Frequency  2x / week    Duration  --   8 weeks or 17 visits   Treatment/Interventions  SLP instruction and feedback;Compensatory strategies;Patient/family education;Multimodal communcation approach;Cueing hierarchy;Language facilitation;Functional tasks;Oral motor exercises;Internal/external aids;Environmental controls;Cognitive reorganization    Potential to Achieve Goals  Fair    Potential Considerations  Severity of impairments;Other (comment)   time post onset of symptoms/peculiar presentation given suspected TBI (suspect language skills to have improved and not declined over time)   Consulted and Agree with Plan of Care  Patient       Patient will benefit from skilled therapeutic intervention in order to improve the following deficits and impairments:   Aphasia  Verbal apraxia  Cognitive communication deficit    Problem List Patient Active Problem List   Diagnosis Date Noted  . Fluency disorder associated with underlying disease 02/08/2018  . Cognitive and neurobehavioral dysfunction 02/08/2018  . 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 Arthritis of carpometacarpal [facility]) joint of left thumb 01/24/2018  . Ankle fracture, left 09/03/2016  . Hand dysfunction 03/11/2016  . Symptomatic varicose veins, bilateral 03/11/2016  . Numbness of left thumb 03/11/2016  . Elevated BP 07/27/2014  . Bunion of left foot 07/26/2014  . Abnormality of gait  07/26/2014  . Cough 11/10/2013  . Post-nasal drainage 11/10/2013  . Abnormal urine odor 11/10/2013  . Visit for preventive health examination 07/28/2013  . Right anterior knee pain 07/22/2012  . Medicare annual wellness visit, subsequent 07/25/2011  . ANXIETY, SITUATIONAL 09/25/2009  . ARTHRITIS 09/25/2009  . Vitamin D deficiency 09/27/2008  . Hyperlipidemia 09/17/2008  . Elevated blood pressure reading without diagnosis of hypertension 09/17/2008  . COLONIC POLYPS, HX OF 09/17/2008  . PERSONAL HISTORY DISEASES SKIN&SUBCUT TISSUE 09/17/2008    [facility], CCC-SLP  11/22/2018, 4:17 PM  [facility] [address] [place], [place], [address] Phone: [phone]   Fax:  [phone]  Name: Pamela Alvarez MRN: [id number] Date of Birth: September 19, 1940

## 2018-11-22 NOTE — Therapy (Signed)
[facility] [address] [place], [place], [address] Phone: [phone]   Fax:  [phone]  Occupational Therapy Evaluation  Patient Details  Name: Pamela Alvarez MRN: [id number] Date of Birth: January 13, 1941 No data recorded  Encounter Date: 11/22/2018  OT End of Session - 11/22/18 1408    Visit Number  1    Number of Visits  13    Date for OT Re-Evaluation  01/06/19    OT Start Time  1020    OT Stop Time  1100    OT Time Calculation (min)  40 min    Activity Tolerance  Patient tolerated treatment well    Behavior During Therapy  Restless;Impulsive       Past Medical History:  Diagnosis Date  . Cyst 07-2008   Cyst on back x2 that were drained  . H/O echocardiogram 02-2000  . Hx of colonic polyps   . Hyperlipidemia   . Hypertension   . Varicose veins of both lower extremities     Past Surgical History:  Procedure Laterality Date  . POLYPECTOMY     Colon  . TONSILLECTOMY    . TUBAL LIGATION      There were no vitals filed for this visit.  Subjective Assessment - 11/22/18 1025    Subjective   Pt denies pain    Pertinent History  78 y.o female with recent declining cognitive abilities, per MRI -right frontal, right parietal and right cerebellar encephalomalacia and gliosis, possible embolic chronic ischemic infarcts.Pt has hx of ankle fx, hyperlipidemia, and thumb CMC arthritis. Per pt's husband MD is still trying to determine a definative diagnosis, per chart, possible PSP vs. TBI.    Limitations  fall risk    Patient Stated Goals  improved use of L hand, to be as independent as possible    Currently in Pain?  No/denies        [facility] OT Assessment - 11/22/18 1028      Assessment   Medical Diagnosis  Acute cognitive decline, clumsiness of LUE, Gait abnormality, recurrent falls    Onset Date/Surgical Date  10/27/18    Hand Dominance  Left      Precautions   Precautions  Fall      Balance Screen   Has  the patient fallen in the past 6 months  Yes    How many times?  7    Has the patient had a decrease in activity level because of a fear of falling?   Yes    Is the patient reluctant to leave their home because of a fear of falling?   Yes      Home  Environment   Family/patient expects to be discharged to:  Private residence    Available Help at Discharge  Family    Home Layout  Able to live on main level with bedroom/bathroom    Lives With  Spouse      Prior Function   Level of Independence  Needs assistance with gait;Needs assistance with ADLs    Leisure  Pretty sedentary; will go for rides, Zoom Pilates 2x/wk      ADL   Eating/Feeding  Needs assist with cutting food    Grooming  Supervision/safety    Upper Body Bathing  Maximal assistance    Lower Body Bathing  Maximal assistance   have seat that she can sit on, has grab bar   Upper Body Dressing  Supervision/safety    Lower Body Dressing  Supervision/safety    Armed forces technical officer  Supervision/safety   has Warden/ranger  Supervision/safety      IADL   Shopping  Needs to be accompanied on any shopping trip    Light Housekeeping  Does not participate in any housekeeping tasks   able to set the table   Meal Prep  Needs to have meals prepared and served    Medication Management  Takes responsibility if medication is prepared in advance in seperate dosage      Mobility   Mobility Status  History of falls   supervision      Written Expression   Dominant Hand  Left    Handwriting  --   Husband reports micrographia     Vision - History   Baseline Vision  Bifocals      Vision Assessment   Vision Assessment  Vision impaired  _ to be further tested in functional context    Tracking/Visual Pursuits  --   unable to track inferiorly   Comment  Pt demonstrates impaired ability to track however, due to language deficits, pt demo difficulty following commands      Cognition   Overall Cognitive Status   Impaired/Different from baseline    Attention  Sustained    Sustained Attention  Impaired    Memory  --    Awareness  Impaired    Problem Solving  Impaired    Behaviors  Impulsive;Restless   moves quickly     Posture/Postural Control   Posture/Postural Control  Postural limitations    Postural Limitations  Forward head      Sensation   Light Touch  Impaired by gross assessment   left thumb     Coordination   Gross Motor Movements are Fluid and Coordinated  No    Fine Motor Movements are Fluid and Coordinated  No    9 Hole Peg Test  Right;Left    Right 9 Hole Peg Test  72.47 secs    Left 9 Hole Peg Test  2 mins 5 secs to place 9 pegs    Box and Blocks  RE 21 LUE 11    Coordination  impaired for LUE,       ROM / Strength   AROM / PROM / Strength  AROM;Strength      AROM   Overall AROM   Deficits    Overall AROM Comments  RUE shoulder flexion 110, abduction 110, LUE shoulder flexion 90, abduction 90      Strength   Overall Strength  Deficits    Overall Strength Comments  Difficulty following directions for MMT, grossly 3-3+5/ for UE's      Hand Function   Right Hand Grip (lbs)  32    Left Hand Grip (lbs)  20                        OT Short Term Goals - 11/22/18 1422      OT SHORT TERM GOAL #1   Title  Pt/ caregiver will be I with HEP.    Time  4    Period  Weeks    Status  New    Target Date  12/22/18      OT SHORT TERM GOAL #2   Title  Pt/ husband will verbalize understanding of adapted strategies to maximize safety and independence with ADLs/ IADLs.    Time  4    Period  Weeks  Status  New      OT SHORT TERM GOAL #3   Title  Pt will demonstrate ability to retrieve a lightweight object at 100 shoulder flexion with LUE.    Time  4    Period  Weeks    Status  New      OT SHORT TERM GOAL #4   Title  Pt will demonstrate ability to retrieve a lightweight object at 115 shoulder flexion with RUE.    Time  4    Period  Weeks    Status  New         OT Long Term Goals - 11/22/18 1424      OT LONG TERM GOAL #1   Title  Pt will increase LUE funtional use and reach as eveidenced by increasing LUE box/ blocks to 15 blocks.    Time  8    Period  Weeks    Status  New      OT LONG TERM GOAL #2   Title  Pt will increase LUE grip strength to 25 lbs or greater for increased ease with ADLS.    Time  8    Period  Weeks    Status  New      OT LONG TERM GOAL #3   Title  Pt will demonstrate ability to write her name legibly with good letter size x 3  trials.    Time  8    Period  Weeks    Status  New      OT LONG TERM GOAL #4   Title  Pt will demonstrate ability to retrieve a lightweight object aty 105 shoulder flexion with LUE.    Time  8    Period  Weeks    Status  New            Plan - 11/22/18 1411    Clinical Impression Statement  78 y.o female with recent declining cognitive abilities, per MRI -right frontal, right parietal and right cerebellar encephalomalacia and gliosis, possible embolic chronic ischemic infarcts. Per pt's husband MD is still trying to determine a definative diagnosis, per chart, possible PSP vs. TBI.  Pt presents to occupational therapy with the following deficits: aphasia, cognitive deficits, LUE weakness and coordination deficits, visual deficits, decreased balance, decreased functional mobility with multiple falls which impedes performance of ADLs/IADLs. Pt can benefit from skilled occupational therapy to maximize pt's safety and independence with daily activities. 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 She has children but they do not live nearby.    OT Occupational Profile and History  Detailed Assessment- Review of Records and additional review of physical, cognitive, psychosocial history related to current functional performance    Occupational performance deficits (Please refer to evaluation for details):  ADL's;IADL's;Leisure;Work;Social Participation    Body Structure  / Function / Physical Skills  ADL;UE functional use;Balance;Flexibility;Vision;FMC;ROM;Gait;Coordination;GMC;Sensation;Decreased knowledge of precautions;Decreased knowledge of use of DME;IADL;Strength;Dexterity;Mobility    Cognitive Skills  Attention;Learn;Memory;Problem Solve;Safety Awareness;Sequencing;Thought;Understand    Rehab Potential  Fair    Clinical Decision Making  Several treatment options, min-mod task modification necessary   extra cueing due to aphasia, demonstration   Comorbidities Affecting Occupational Performance:  May have comorbidities impacting occupational performance    Modification or Assistance to Complete Evaluation   Min-Moderate modification of tasks or assist with assess necessary to complete eval   increased time and instruction   OT Frequency  2x / week   aticipate d/c after 4-6 weeks  dependent upon pt progress.   OT Duration  8 weeks    OT Treatment/Interventions  Self-care/ADL training;Energy conservation;Visual/perceptual remediation/compensation;Patient/family education;DME and/or AE instruction;Aquatic Therapy;Paraffin;Passive range of motion;Balance training;Fluidtherapy;Cryotherapy;Building services engineerunctional Mobility Training;Therapeutic activities;Manual Therapy;Therapeutic exercise;Moist Heat;Neuromuscular education;Cognitive remediation/compensation    Plan  initiate HEP for LUE coordination, putty    Consulted and Agree with Plan of Care  Patient;Family member/caregiver    Family Member Consulted  husband       Patient will benefit from skilled therapeutic intervention in order to improve the following deficits and impairments:   Body Structure / Function / Physical Skills: ADL, UE functional use, Balance, Flexibility, Vision, FMC, ROM, Gait, Coordination, GMC, Sensation, Decreased knowledge of precautions, Decreased knowledge of use of DME, IADL, Strength, Dexterity, Mobility Cognitive Skills: Attention, Learn, Memory, Problem Solve, Safety Awareness, Sequencing,  Thought, Understand     Visit Diagnosis: Other lack of coordination - Plan: Ot plan of care cert/re-cert  Muscle weakness (generalized) - Plan: Ot plan of care cert/re-cert  Frontal lobe and executive function deficit - Plan: Ot plan of care cert/re-cert  Attention and concentration deficit - Plan: Ot plan of care cert/re-cert  Unsteadiness on feet - Plan: Ot plan of care cert/re-cert  Other abnormalities of gait and mobility - Plan: Ot plan of care cert/re-cert  Visuospatial deficit - Plan: Ot plan of care cert/re-cert    Problem List Patient Active Problem List   Diagnosis Date Noted  . Fluency disorder associated with underlying disease 02/08/2018  . Cognitive and neurobehavioral dysfunction 02/08/2018  . 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 Arthritis of carpometacarpal [facility] joint of left thumb 01/24/2018  . Ankle fracture, left 09/03/2016  . Hand dysfunction 03/11/2016  . Symptomatic varicose veins, bilateral 03/11/2016  . Numbness of left thumb 03/11/2016  . Elevated BP 07/27/2014  . Bunion of left foot 07/26/2014  . Abnormality of gait 07/26/2014  . Cough 11/10/2013  . Post-nasal drainage 11/10/2013  . Abnormal urine odor 11/10/2013  . Visit for preventive health examination 07/28/2013  . Right anterior knee pain 07/22/2012  . Medicare annual wellness visit, subsequent 07/25/2011  . ANXIETY, SITUATIONAL 09/25/2009  . ARTHRITIS 09/25/2009  . Vitamin D deficiency 09/27/2008  . Hyperlipidemia 09/17/2008  . Elevated blood pressure reading without diagnosis of hypertension 09/17/2008  . COLONIC POLYPS, HX OF 09/17/2008  . PERSONAL HISTORY DISEASES SKIN&SUBCUT TISSUE 09/17/2008    RINE,KATHRYN 11/22/2018, 2:35 PM  [facility] [address] [place], [place], [address] Phone: [phone]   Fax:  [phone]  Name: Pamela Alvarez MRN: [id number] Date of Birth: 10/21/1940

## 2018-11-22 NOTE — Patient Instructions (Signed)
[facility]- app ($ after 14 days) [facility] - website (free)

## 2018-11-25 ENCOUNTER — Other Ambulatory Visit: Payer: Self-pay

## 2018-11-25 ENCOUNTER — Ambulatory Visit: Payer: Medicare Other

## 2018-11-25 ENCOUNTER — Ambulatory Visit (INDEPENDENT_AMBULATORY_CARE_PROVIDER_SITE_OTHER): Payer: Medicare Other

## 2018-11-25 DIAGNOSIS — Z23 Encounter for immunization: Secondary | ICD-10-CM

## 2018-12-06 ENCOUNTER — Other Ambulatory Visit: Payer: Self-pay

## 2018-12-06 ENCOUNTER — Ambulatory Visit: Payer: Medicare Other | Admitting: Speech Pathology

## 2018-12-06 ENCOUNTER — Ambulatory Visit: Payer: Medicare Other | Admitting: Physical Therapy

## 2018-12-06 ENCOUNTER — Encounter: Payer: Self-pay | Admitting: Physical Therapy

## 2018-12-06 DIAGNOSIS — R278 Other lack of coordination: Secondary | ICD-10-CM | POA: Diagnosis not present

## 2018-12-06 DIAGNOSIS — R2681 Unsteadiness on feet: Secondary | ICD-10-CM

## 2018-12-06 DIAGNOSIS — R41841 Cognitive communication deficit: Secondary | ICD-10-CM

## 2018-12-06 DIAGNOSIS — R482 Apraxia: Secondary | ICD-10-CM

## 2018-12-06 DIAGNOSIS — M6281 Muscle weakness (generalized): Secondary | ICD-10-CM

## 2018-12-06 DIAGNOSIS — R2689 Other abnormalities of gait and mobility: Secondary | ICD-10-CM

## 2018-12-06 DIAGNOSIS — R4701 Aphasia: Secondary | ICD-10-CM

## 2018-12-06 NOTE — Therapy (Signed)
[facility] [address] [place], [place], [address] Phone: [phone]   Fax:  [phone]  Physical Therapy Treatment  Patient Details  Name: Pamela Alvarez MRN: [id number] Date of Birth: 03-27-1940 Referring Provider (PT): Dohmeier, Porfirio Mylar   Encounter Date: 12/06/2018  PT End of Session - 12/06/18 1710    Visit Number  2    Number of Visits  13    Date for PT Re-Evaluation  01/21/19    Authorization Type  [employer] Medicare-will need 10th visit progress note    PT Start Time  1453    PT Stop Time  1540    PT Time Calculation (min)  47 min    Equipment Utilized During Treatment  Gait belt    Activity Tolerance  Patient tolerated treatment well    Behavior During Therapy  Impulsive   Impulsive with transfers, does not wait for directions for objective tests      Past Medical History:  Diagnosis Date  . Cyst 07-2008   Cyst on back x2 that were drained  . H/O echocardiogram 02-2000  . Hx of colonic polyps   . Hyperlipidemia   . Hypertension   . Varicose veins of both lower extremities     Past Surgical History:  Procedure Laterality Date  . POLYPECTOMY     Colon  . TONSILLECTOMY    . TUBAL LIGATION      There were no vitals filed for this visit.  Subjective Assessment - 12/06/18 1654    Subjective  Denies falls or changes since last visit.  Husband reports pt has decreased balance when backing up.  Reports that they do zoom pilates with jennifer cochran 2x/week.    Patient is accompained by:  Family member   Husband   Patient Stated Goals  Pt's goal for therapy is better balance and walking.    Currently in Pain?  No/denies         [facility] Adult PT Treatment/Exercise - 12/06/18 0001      Transfers   Transfers  Sit to Stand;Stand to Sit    Sit to Stand  5: Supervision;4: Min guard;4: Min assist    Sit to Stand Details  Tactile cues for sequencing;Tactile cues for weight shifting;Tactile cues for  placement;Verbal cues for technique;Verbal cues for precautions/safety;Manual facilitation for placement    Stand to Sit  5: Supervision;4: Min guard;4: Min assist    Number of Reps  10 reps;2 sets    Comments  Pt needs constant cues for proper technique with sit<>stand and little carry over from on rep to the next.  Tends to want to pull up on walker.  Poor control of descent.        Ambulation/Gait   Ambulation/Gait  Yes    Ambulation/Gait Assistance  4: Min assist    Ambulation Distance (Feet)  110 Feet   x 2 and 330' x 2   Assistive device  Rollator;Other (Comment);1 person hand held assist   UP walker   Gait Pattern  Step-to pattern;Step-through pattern;Decreased arm swing - right;Decreased arm swing - left;Decreased step length - right;Decreased step length - left;Shuffle    Ambulation Surface  Level;Indoor    Pre-Gait Activities  Standing at counter for forward/backward step weight shifting x 30 reps x 2 sets with 2 UE then progressing to 1 UE support.  Max cues for technique and to maintain technique along with manual facilitation for weight shift.  Repeated same in // bars with colored dots  on floor as visual cues to encourage pattern and step length.  Pt continued to need max assist to follow technique.      Gait Comments  Husband tends to perform 1 person HHA with pt into clinic, etc...  Pt needed moderate cues and min assist to safely use rollator and UP walker.  Pt tending to let both devices advance too far foward and needing assist to control.  Pt with better safety with Rollator than UP walkr.  Cues to keep rollator close along with cues on how to lock/unlock brakes.  Poor carryover from on bout of ambulation to the next.      Posture/Postural Control   Posture/Postural Control  Postural limitations    Postural Limitations  Forward head             PT Education - 12/06/18 1709    Education Details  Recommendation to use Rollator vs UP walker for improved safety, purpose  of weight shifting activity in session today    Person(s) Educated  Patient;Spouse    Methods  Explanation;Demonstration;Tactile cues;Verbal cues    Comprehension  Verbalized understanding;Need further instruction   Husband able to verbalize;pt needs continued instruciton      PT Short Term Goals - 11/22/18 1257      PT SHORT TERM GOAL #1   Title  Pt will perform HEP with family supervision for improved transfers, balance, and gait.  TARGET 12/16/2018 (may be delayed due to later start with later scheduling)    Time  4    Period  Weeks    Status  New    Target Date  12/16/18      PT SHORT TERM GOAL #2   Title  Pt will perform 4 of 5 reps of sit<>stand transfers with correct technique, with husband supervision and cues, for improved safety with transfers.    Time  4    Period  Weeks    Status  New    Target Date  12/16/18      PT SHORT TERM GOAL #3   Title  Pt will improve Berg Balance score to at least 24/56 for decreased fall risk.    Time  4    Period  Weeks    Status  New    Target Date  12/16/18      PT SHORT TERM GOAL #4   Title  Pt will improve TUG score to less than or equal to 23 seconds for decreased fall risk.    Time  4    Period  Weeks    Status  New    Target Date  12/16/18      PT SHORT TERM GOAL #5   Title  Pt/husband will verbalize understanding of fall prevention in home environment.    Time  4    Period  Weeks    Status  New    Target Date  12/16/18        PT Long Term Goals - 11/22/18 1302      PT LONG TERM GOAL #1   Title  Pt will perform updated HEP for balance, gait, with husband's supervision.  TARGET 12/30/2018 (may be delayed due to delayed scheduling)    Time  6    Period  Weeks    Status  New    Target Date  12/30/18      PT LONG TERM GOAL #2   Title  Pt will improve gait velocity to at  least 1.5 ft/sec  with appropriate device and supervision, for improved gait efficiency.    Time  6    Period  Weeks    Status  New    Target  Date  12/30/18      PT LONG TERM GOAL #3   Title  Pt/husband will verbalize/demonstrate understanding of appropriate floor>stand transfers for safe fall recovery.    Time  4    Period  Weeks    Status  New    Target Date  12/30/18            Plan - 12/06/18 1711    Clinical Impression Statement  Pt requiring max verbal cues along with tactile cues and min assist with mobility during session today with decreased carryover during session.  Pt has difficulty following instructions and needs rest breaks during session.  Pt with decreased stability with UP walker.  Continue PT per POC.    Personal Factors and Comorbidities  Comorbidity 3+    Comorbidities  PMH includes ankle fracture, HTN, hyperlipidemia, recurrent falls    Examination-Activity Limitations  Squat;Stairs;Stand;Transfers;Locomotion Level    Examination-Participation Restrictions  Community Activity    Stability/Clinical Decision Making  Evolving/Moderate complexity    Rehab Potential  Fair   MMSE score 16/30; good family support   PT Frequency  2x / week    PT Duration  6 weeks   plus eval   PT Treatment/Interventions  ADLs/Self Care Home Management;Gait training;DME Instruction;Neuromuscular re-education;Balance training;Therapeutic exercise;Therapeutic activities;Functional mobility training;Patient/family education    PT Next Visit Plan  Initiate HEP-balance, muscle strength;gait with rollator;weight shift/stepping activities; safety education will need to be provided to pt and husband    Consulted and Agree with Plan of Care  Patient;Family member/caregiver    Family Member Consulted  Husband       Patient will benefit from skilled therapeutic intervention in order to improve the following deficits and impairments:  Abnormal gait, Difficulty walking, Decreased safety awareness, Decreased balance, Decreased mobility  Visit Diagnosis: Other abnormalities of gait and mobility  Unsteadiness on feet  Muscle weakness  (generalized)     Problem List Patient Active Problem List   Diagnosis Date Noted  . Fluency disorder associated with underlying disease 02/08/2018  . Cognitive and neurobehavioral dysfunction 02/08/2018  . 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 Arthritis of carpometacarpal [facility] joint of left thumb 01/24/2018  . Ankle fracture, left 09/03/2016  . Hand dysfunction 03/11/2016  . Symptomatic varicose veins, bilateral 03/11/2016  . Numbness of left thumb 03/11/2016  . Elevated BP 07/27/2014  . Bunion of left foot 07/26/2014  . Abnormality of gait 07/26/2014  . Cough 11/10/2013  . Post-nasal drainage 11/10/2013  . Abnormal urine odor 11/10/2013  . Visit for preventive health examination 07/28/2013  . Right anterior knee pain 07/22/2012  . Medicare annual wellness visit, subsequent 07/25/2011  . ANXIETY, SITUATIONAL 09/25/2009  . ARTHRITIS 09/25/2009  . Vitamin D deficiency 09/27/2008  . Hyperlipidemia 09/17/2008  . Elevated blood pressure reading without diagnosis of hypertension 09/17/2008  . COLONIC POLYPS, HX OF 09/17/2008  . PERSONAL HISTORY DISEASES SKIN&SUBCUT TISSUE 09/17/2008    Newell Coralenise Terry Bethany Cumming, PTA [facility] 12/06/18 5:21 PM Phone: [phone] Fax: [phone]   [facility] [address] [place], [place], [address] Phone: [phone]   Fax:  [phone]  Name: Pamela ApleySandra Alvarez MRN: [id number] Date of Birth: 11/28/1940

## 2018-12-07 NOTE — Therapy (Signed)
[facility] [address] [place], [place], [address] Phone: [phone]   Fax:  [phone]  Speech Language Pathology Treatment  Patient Details  Name: Pamela Alvarez MRN: [id number] Date of Birth: 07/12/1940 Referring Provider (SLP): Dohmeier, Porfirio Mylararmen, MD   Encounter Date: 12/06/2018  End of Session - 12/07/18 0744    Visit Number  2    Number of Visits  17    Date for SLP Re-Evaluation  02/20/19   90 days   SLP Start Time  1315    SLP Stop Time   1402    SLP Time Calculation (min)  47 min    Activity Tolerance  Patient tolerated treatment well       Past Medical History:  Diagnosis Date  . Cyst 07-2008   Cyst on back x2 that were drained  . H/O echocardiogram 02-2000  . Hx of colonic polyps   . Hyperlipidemia   . Hypertension   . Varicose veins of both lower extremities     Past Surgical History:  Procedure Laterality Date  . POLYPECTOMY     Colon  . TONSILLECTOMY    . TUBAL LIGATION      There were no vitals filed for this visit.  Subjective Assessment - 12/06/18 1321    Subjective  "I was... [employer]."    Currently in Pain?  No/denies            ADULT SLP TREATMENT - 12/06/18 1321      General Information   Behavior/Cognition  Alert;Cooperative;Pleasant mood      Treatment Provided   Treatment provided  Cognitive-Linquistic      Pain Assessment   Pain Assessment  No/denies pain      Cognitive-Linquistic Treatment   Treatment focused on  Aphasia;Patient/family/caregiver education    Skilled Treatment  Administered Western Aphasia Battery-Revised part 1 and began supplemental assessment of reading. Pt scores as follows: Spontaneous speech 13/20, Auditory Verbal comprehension 8.25/10, Repetition 8.9/10, Naming and word finding 7.7/10. Aphasia Quotient 75.7 (mild-moderate; 51-75=moderate, 76+ = mild). Will continue reading/writing assessment in subsequent sessions, but reading  comprehension is impaired in at least simple sentences. Question some degree of apraxia, with occasional vowel distortions, phonemic errors.      Assessment / Recommendations / Plan   Plan  Continue with current plan of care      Progression Toward Goals   Progression toward goals  Progressing toward goals         SLP Short Term Goals - 12/07/18 0749      SLP SHORT TERM GOAL #1   Title  pt will demo ability for functionalality with simple household langauge tasks over 3 sessions    Time  4    Period  Weeks    Status  On-going      SLP SHORT TERM GOAL #2   Title  pt will achieve functional langauge production in 10 minutes simple conversation with modifed independence (compensations) over three sessions    Time  4    Period  Weeks    Status  On-going      SLP SHORT TERM GOAL #3   Title  pt will demo correct right/left comprehension in simple functional commands 9/10 opportunities    Time  4    Period  Weeks    Status  On-going      SLP SHORT TERM GOAL #4   Title  pt will complete full aphasia eval before total visit #3  Baseline  form 1 completed 12/06/18    Time  2    Period  Weeks    Status  On-going      SLP SHORT TERM GOAL #5   Title  pt will complete cognitive linguistic eval PRN prior to total visit #7    Time  3    Period  Weeks    Status  On-going       SLP Long Term Goals - 12/07/18 0750      SLP LONG TERM GOAL #1   Title  pt will demonstrate improved verbal communication (as rated by pt) compared to when ST began    Time  8    Period  Weeks    Status  On-going      SLP LONG TERM GOAL #2   Title  pt will report speech and language is less frustrating than prior to initiation of ST    Time  8    Period  Weeks    Status  On-going      SLP LONG TERM GOAL #3   Title  pt will produce functional verbal expression in 10 minutes simple to mod complex conversation over 3 sessions    Time  8    Period  Weeks    Status  On-going       Plan - 12/07/18  0745    Clinical Impression Statement  Pt presents with mild-moderate anomic aphasia per Western Aphasia Battery-Revised. Form 1 completed today; will complete comprehensive aphasia assessment and consider cognitive assessment in subsequent sessions. Functionally, SLP feels pt's deficits are at least moderate. Strange presentation, as we would expect language skills to have improved initially with spontaneous recovery from TBI, yet husband reports skills in this area have declined consistently since pt's fall where she struck concrete. SLP recommends a course of skilled ST to address pt's decr'd language-cogniton ability/possible verbal apraxia.to incr. pt independence.    Speech Therapy Frequency  2x / week    Duration  --   8 weeks or 17 visits   Treatment/Interventions  SLP instruction and feedback;Compensatory strategies;Patient/family education;Multimodal communcation approach;Cueing hierarchy;Language facilitation;Functional tasks;Oral motor exercises;Internal/external aids;Environmental controls;Cognitive reorganization    Potential to Achieve Goals  Fair    Potential Considerations  Severity of impairments;Other (comment)   time post onset of symptoms/peculiar presentation given suspected TBI (suspect language skills to have improved and not declined over time)   Consulted and Agree with Plan of Care  Patient       Patient will benefit from skilled therapeutic intervention in order to improve the following deficits and impairments:   Aphasia  Verbal apraxia  Cognitive communication deficit    Problem List Patient Active Problem List   Diagnosis Date Noted  . Fluency disorder associated with underlying disease 02/08/2018  . Cognitive and neurobehavioral dysfunction 02/08/2018  . 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 Arthritis of carpometacarpal [facility]) joint of left thumb 01/24/2018  . Ankle fracture, left 09/03/2016  . Hand dysfunction 03/11/2016  . Symptomatic varicose veins,  bilateral 03/11/2016  . Numbness of left thumb 03/11/2016  . Elevated BP 07/27/2014  . Bunion of left foot 07/26/2014  . Abnormality of gait 07/26/2014  . Cough 11/10/2013  . Post-nasal drainage 11/10/2013  . Abnormal urine odor 11/10/2013  . Visit for preventive health examination 07/28/2013  . Right anterior knee pain 07/22/2012  . Medicare annual wellness visit, subsequent 07/25/2011  . ANXIETY, SITUATIONAL 09/25/2009  . ARTHRITIS 09/25/2009  . Vitamin D deficiency 09/27/2008  .  Hyperlipidemia 09/17/2008  . Elevated blood pressure reading without diagnosis of hypertension 09/17/2008  . COLONIC POLYPS, HX OF 09/17/2008  . PERSONAL HISTORY DISEASES SKIN&SUBCUT TISSUE 09/17/2008   Rondel Baton, MS, CCC-SLP Speech-Language Pathologist  Pamela Alvarez 12/07/2018, 7:51 AM  [facility] [address] [place], [place], [address] Phone: [phone]   Fax:  [phone]   Name: Pamela Alvarez MRN: [id number] Date of Birth: 1941-01-03

## 2018-12-08 ENCOUNTER — Ambulatory Visit: Payer: Medicare Other

## 2018-12-08 ENCOUNTER — Other Ambulatory Visit: Payer: Self-pay

## 2018-12-08 ENCOUNTER — Ambulatory Visit: Payer: Medicare Other | Attending: Neurology | Admitting: Physical Therapy

## 2018-12-08 DIAGNOSIS — R2681 Unsteadiness on feet: Secondary | ICD-10-CM | POA: Insufficient documentation

## 2018-12-08 DIAGNOSIS — M6281 Muscle weakness (generalized): Secondary | ICD-10-CM | POA: Diagnosis present

## 2018-12-08 DIAGNOSIS — R41841 Cognitive communication deficit: Secondary | ICD-10-CM | POA: Insufficient documentation

## 2018-12-08 DIAGNOSIS — R4701 Aphasia: Secondary | ICD-10-CM | POA: Diagnosis present

## 2018-12-08 DIAGNOSIS — R278 Other lack of coordination: Secondary | ICD-10-CM | POA: Diagnosis present

## 2018-12-08 DIAGNOSIS — R2689 Other abnormalities of gait and mobility: Secondary | ICD-10-CM | POA: Insufficient documentation

## 2018-12-08 DIAGNOSIS — R41844 Frontal lobe and executive function deficit: Secondary | ICD-10-CM | POA: Diagnosis present

## 2018-12-08 DIAGNOSIS — R482 Apraxia: Secondary | ICD-10-CM

## 2018-12-08 DIAGNOSIS — R41842 Visuospatial deficit: Secondary | ICD-10-CM | POA: Diagnosis present

## 2018-12-08 DIAGNOSIS — R41840 Attention and concentration deficit: Secondary | ICD-10-CM | POA: Insufficient documentation

## 2018-12-08 NOTE — Therapy (Signed)
[facility] [address] [place], [place], [address] Phone: [phone]   Fax:  [phone]  Physical Therapy Treatment  Patient Details  Name: Pamela ApleySandra Alvarez MRN: [id number] Date of Birth: 10/18/1940 Referring Provider (PT): Dohmeier, Porfirio Mylararmen   Encounter Date: 12/08/2018  PT End of Session - 12/08/18 1601    Visit Number  3    Number of Visits  13    Date for PT Re-Evaluation  01/21/19    Authorization Type  [employer] Medicare-will need 10th visit progress note    PT Start Time  1105    PT Stop Time  1145    PT Time Calculation (min)  40 min    Equipment Utilized During Treatment  Gait belt    Activity Tolerance  Patient tolerated treatment well    Behavior During Therapy  Impulsive   Impulsive with transfers, does not wait for directions for objective tests      Past Medical History:  Diagnosis Date  . Cyst 07-2008   Cyst on back x2 that were drained  . H/O echocardiogram 02-2000  . Hx of colonic polyps   . Hyperlipidemia   . Hypertension   . Varicose veins of both lower extremities     Past Surgical History:  Procedure Laterality Date  . POLYPECTOMY     Colon  . TONSILLECTOMY    . TUBAL LIGATION      There were no vitals filed for this visit.  Subjective Assessment - 12/08/18 1550    Subjective  Denies any falls or changes since last visit.  Husband reports he would like to have a program they can perform at home and they need to develop a good schedule for doing exercises and consistency.    Patient is accompained by:  Family member   Husband   Patient Stated Goals  Pt's goal for therapy is better balance and walking.    Currently in Pain?  No/denies        [facility] Adult PT Treatment/Exercise - 12/08/18 0001      Transfers   Transfers  Sit to Stand;Stand to Sit    Sit to Stand  5: Supervision;4: Min guard    Sit to Stand Details  Tactile cues for sequencing;Tactile cues for weight shifting;Tactile cues  for placement;Verbal cues for technique;Verbal cues for precautions/safety;Manual facilitation for placement    Stand to Sit  5: Supervision    Number of Reps  10 reps    Comments  Pt needs constant cues for proper technique with sit<>stand and little carry over from on rep to the next.  Tends to want to pull up on walker.  Poor control of descent.        Ambulation/Gait   Ambulation/Gait  Yes    Ambulation/Gait Assistance  4: Min guard;4: Min assist    Ambulation/Gait Assistance Details  Improved ability to maintain appropriate distance with    Ambulation Distance (Feet)  330 Feet    Assistive device  Rollator    Gait Pattern  Step-to pattern;Step-through pattern;Decreased arm swing - right;Decreased arm swing - left;Decreased step length - right;Decreased step length - left;Shuffle    Ambulation Surface  Level;Indoor    Pre-Gait Activities  in // bars for forward/backward step weight shifting.  Pt needing max verbal, tactile and visual cues to perform activity.  Has difficulty following directions      Posture/Postural Control   Posture/Postural Control  Postural limitations    Postural Limitations  Forward head  Balance Exercises - 12/08/18 1558      OTAGO PROGRAM   Head Movements  Sitting;5 reps    Neck Movements  Sitting;5 reps    Back Extension  Standing;5 reps    Trunk Movements  Standing;5 reps    Ankle Movements  Sitting;10 reps    Knee Extensor  10 reps    Knee Flexor  10 reps    Hip ABductor  10 reps    Ankle Plantorflexors  20 reps, support    Ankle Dorsiflexors  20 reps, support    Backwards Walking  Support    Sideways Walking  Assistive device   counter for support   Tandem Stance  10 seconds, support    One Leg Stand  10 seconds, support    Sit to Stand  10 reps, bilateral support    Overall OTAGO Comments  Issued OTAGO booklet to pt and husband to initiate at home with husband to perform exercises with patient.  Husband reports feeling comfortable  with exercises.         PT Education - 12/08/18 1600    Education Details  OTAGO    Person(s) Educated  Patient;Spouse    Methods  Explanation;Demonstration;Handout    Comprehension  Verbalized understanding;Returned demonstration;Need further instruction   pt need continued instruction      PT Short Term Goals - 11/22/18 1257      PT SHORT TERM GOAL #1   Title  Pt will perform HEP with family supervision for improved transfers, balance, and gait.  TARGET 12/16/2018 (may be delayed due to later start with later scheduling)    Time  4    Period  Weeks    Status  New    Target Date  12/16/18      PT SHORT TERM GOAL #2   Title  Pt will perform 4 of 5 reps of sit<>stand transfers with correct technique, with husband supervision and cues, for improved safety with transfers.    Time  4    Period  Weeks    Status  New    Target Date  12/16/18      PT SHORT TERM GOAL #3   Title  Pt will improve Berg Balance score to at least 24/56 for decreased fall risk.    Time  4    Period  Weeks    Status  New    Target Date  12/16/18      PT SHORT TERM GOAL #4   Title  Pt will improve TUG score to less than or equal to 23 seconds for decreased fall risk.    Time  4    Period  Weeks    Status  New    Target Date  12/16/18      PT SHORT TERM GOAL #5   Title  Pt/husband will verbalize understanding of fall prevention in home environment.    Time  4    Period  Weeks    Status  New    Target Date  12/16/18        PT Long Term Goals - 11/22/18 1302      PT LONG TERM GOAL #1   Title  Pt will perform updated HEP for balance, gait, with husband's supervision.  TARGET 12/30/2018 (may be delayed due to delayed scheduling)    Time  6    Period  Weeks    Status  New    Target Date  12/30/18  PT LONG TERM GOAL #2   Title  Pt will improve gait velocity to at  least 1.5 ft/sec with appropriate device and supervision, for improved gait efficiency.    Time  6    Period  Weeks     Status  New    Target Date  12/30/18      PT LONG TERM GOAL #3   Title  Pt/husband will verbalize/demonstrate understanding of appropriate floor>stand transfers for safe fall recovery.    Time  4    Period  Weeks    Status  New    Target Date  12/30/18            Plan - 12/08/18 1601    Clinical Impression Statement  Pt continues to have difficulty following directions even with visual, tactile and verbal cues.  Initiated OTAGO exercise program in hopes that consistency will help.  Continue PT per POC.    Personal Factors and Comorbidities  Comorbidity 3+    Comorbidities  PMH includes ankle fracture, HTN, hyperlipidemia, recurrent falls    Examination-Activity Limitations  Squat;Stairs;Stand;Transfers;Locomotion Level    Examination-Participation Restrictions  Community Activity    Stability/Clinical Decision Making  Evolving/Moderate complexity    Rehab Potential  Fair   MMSE score 16/30; good family support   PT Frequency  2x / week    PT Duration  6 weeks   plus eval   PT Treatment/Interventions  ADLs/Self Care Home Management;Gait training;DME Instruction;Neuromuscular re-education;Balance training;Therapeutic exercise;Therapeutic activities;Functional mobility training;Patient/family education    PT Next Visit Plan  Follow up on OTAGO and if husband has any questions.  Continue weight shift/stepping activities; safety education will need to be provided to pt and husband.  Gait encouraging increased step length.    Consulted and Agree with Plan of Care  Patient;Family member/caregiver    Family Member Consulted  Husband       Patient will benefit from skilled therapeutic intervention in order to improve the following deficits and impairments:  Abnormal gait, Difficulty walking, Decreased safety awareness, Decreased balance, Decreased mobility  Visit Diagnosis: Unsteadiness on feet  Other abnormalities of gait and mobility  Muscle weakness (generalized)     Problem  List Patient Active Problem List   Diagnosis Date Noted  . Fluency disorder associated with underlying disease 02/08/2018  . Cognitive and neurobehavioral dysfunction 02/08/2018  . 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 Arthritis of carpometacarpal [facility]) joint of left thumb 01/24/2018  . Ankle fracture, left 09/03/2016  . Hand dysfunction 03/11/2016  . Symptomatic varicose veins, bilateral 03/11/2016  . Numbness of left thumb 03/11/2016  . Elevated BP 07/27/2014  . Bunion of left foot 07/26/2014  . Abnormality of gait 07/26/2014  . Cough 11/10/2013  . Post-nasal drainage 11/10/2013  . Abnormal urine odor 11/10/2013  . Visit for preventive health examination 07/28/2013  . Right anterior knee pain 07/22/2012  . Medicare annual wellness visit, subsequent 07/25/2011  . ANXIETY, SITUATIONAL 09/25/2009  . ARTHRITIS 09/25/2009  . Vitamin D deficiency 09/27/2008  . Hyperlipidemia 09/17/2008  . Elevated blood pressure reading without diagnosis of hypertension 09/17/2008  . COLONIC POLYPS, HX OF 09/17/2008  . PERSONAL HISTORY DISEASES SKIN&SUBCUT TISSUE 09/17/2008   Narda Bonds, PTA [place] 12/08/18 4:05 PM Phone: [phone] Fax: [place] [address] [place], [place], [address] Phone: [phone]   Fax:  [phone]  Name: Pamela Alvarez MRN: [id number] Date of Birth: 1940-05-02

## 2018-12-08 NOTE — Therapy (Signed)
[facility] [address] [place], [place], [address] Phone: [phone]   Fax:  [phone]  Speech Language Pathology Treatment  Patient Details  Name: Pamela Alvarez MRN: [id number] Date of Birth: 1940/10/27 Referring Provider (SLP): Dohmeier, Asencion Partridge, MD   Encounter Date: 12/08/2018  End of Session - 12/08/18 1626    Visit Number  3    Number of Visits  17    Date for SLP Re-Evaluation  02/20/19    SLP Start Time  1021    SLP Stop Time   1101    SLP Time Calculation (min)  40 min    Activity Tolerance  Patient tolerated treatment well       Past Medical History:  Diagnosis Date  . Cyst 07-2008   Cyst on back x2 that were drained  . H/O echocardiogram 02-2000  . Hx of colonic polyps   . Hyperlipidemia   . Hypertension   . Varicose veins of both lower extremities     Past Surgical History:  Procedure Laterality Date  . POLYPECTOMY     Colon  . TONSILLECTOMY    . TUBAL LIGATION      There were no vitals filed for this visit.         ADULT SLP TREATMENT - 12/08/18 1617      General Information   Behavior/Cognition  Alert;Cooperative;Pleasant mood;Requires cueing      Treatment Provided   Treatment provided  Cognitive-Linquistic      Cognitive-Linquistic Treatment   Treatment focused on  Aphasia    Skilled Treatment  SLP cont'd adminstration of the subtests for Western Aphasia Battery and ascertained writing will likely not be a functional means for pt to communicate. Pt witing her own first name was non-functional, numbers, and letters were also non functional. SLP suggested to husband that he practice with pt tracing her name. SLP told pt/husband that best way for pt to communicate at [facility] point would be verbally as pt can still communicte functionally via this means, even if with some paraphasias and reduced verbal speed. SLP suggested pt/husband get pictures of rooms in house and that way with  breakdown, pt could indicate what room desired topic is, for some narrowing of topic. Goals added.      Assessment / Recommendations / Plan   Plan  Goals updated      Progression Toward Goals   Progression toward goals  Progressing toward goals       SLP Education - 12/08/18 1625    Education Details  trace name, most functional way to communicate is verbal at present, get pictures of rooms in household to assist in breakdowns    Person(s) Educated  Patient    Methods  Explanation;Demonstration    Comprehension  Verbalized understanding       SLP Short Term Goals - 12/08/18 1629      SLP SHORT TERM GOAL #1   Title  pt will demo ability for functionalality with simple household langauge tasks over 3 sessions    Time  4    Period  Weeks    Status  On-going      SLP SHORT TERM GOAL #2   Title  pt will achieve functional langauge production in 8 minutes simple conversation with modifed independence (compensations) over two sessions    Time  4    Period  Weeks    Status  Revised      SLP SHORT TERM GOAL #3  Title  pt will demo correct right/left comprehension in simple functional commands 9/10 opportunities    Time  4    Period  Weeks    Status  On-going      SLP SHORT TERM GOAL #4   Title  pt will complete full aphasia eval before total visit #3    Baseline  form 1 completed 12/06/18    Time  2    Period  Weeks    Status  Achieved      SLP SHORT TERM GOAL #5   Title  pt will complete cognitive linguistic eval PRN prior to total visit #7    Time  3    Period  Weeks    Status  On-going      Additional Short Term Goals   Additional Short Term Goals  Yes       SLP Long Term Goals - 12/08/18 1630      SLP LONG TERM GOAL #1   Title  pt will demonstrate improved verbal communication (as rated by pt) compared to when ST began    Time  8    Period  Weeks    Status  On-going      SLP LONG TERM GOAL #2   Title  pt will report speech and language is less frustrating  than prior to initiation of ST    Time  8    Period  Weeks    Status  On-going      SLP LONG TERM GOAL #3   Title  pt will produce functional verbal expression in 8 minutes simple to mod complex conversation over 3 sessions    Time  8    Period  Weeks    Status  On-going      SLP LONG TERM GOAL #4   Title  pt will use multimodal communication PRN to express wants/needs    Time  8    Period  Weeks    Status  Revised      SLP LONG TERM GOAL #5   Title  pt will functionally write her first name independently in 7/10 attempts over 3 sessions    Time  8    Period  Weeks    Status  New       Plan - 12/08/18 1626    Clinical Impression Statement  Pt presents with mild-moderate anomic aphasia per Western Aphasia Battery-Revised. Form 1 completed today; written and reading aphasia assessment completed today. Possible cognitive testing to occur in subsequent sessions. Functionally, SLP feels pt's deficits are at least moderate. Strange presentation, as we would expect language skills to have improved initially with spontaneous recovery from TBI, yet husband reports skills in this area have declined consistently since pt's fall where she struck concrete. SLP recommends a course of skilled ST to address pt's decr'd language-cogniton ability/possible verbal apraxia.to incr. pt independence.    Speech Therapy Frequency  2x / week    Duration  --   8 weeks or 17 visits   Treatment/Interventions  SLP instruction and feedback;Compensatory strategies;Patient/family education;Multimodal communcation approach;Cueing hierarchy;Language facilitation;Functional tasks;Oral motor exercises;Internal/external aids;Environmental controls;Cognitive reorganization    Potential to Achieve Goals  Fair    Potential Considerations  Severity of impairments;Other (comment)   time post onset of symptoms/peculiar presentation given suspected TBI (suspect language skills to have improved and not declined over time)    Consulted and Agree with Plan of Care  Patient       Patient will benefit  from skilled therapeutic intervention in order to improve the following deficits and impairments:   Aphasia  Cognitive communication deficit  Verbal apraxia    Problem List Patient Active Problem List   Diagnosis Date Noted  . Fluency disorder associated with underlying disease 02/08/2018  . Cognitive and neurobehavioral dysfunction 02/08/2018  . 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 Arthritis of carpometacarpal [facility]) joint of left thumb 01/24/2018  . Ankle fracture, left 09/03/2016  . Hand dysfunction 03/11/2016  . Symptomatic varicose veins, bilateral 03/11/2016  . Numbness of left thumb 03/11/2016  . Elevated BP 07/27/2014  . Bunion of left foot 07/26/2014  . Abnormality of gait 07/26/2014  . Cough 11/10/2013  . Post-nasal drainage 11/10/2013  . Abnormal urine odor 11/10/2013  . Visit for preventive health examination 07/28/2013  . Right anterior knee pain 07/22/2012  . Medicare annual wellness visit, subsequent 07/25/2011  . ANXIETY, SITUATIONAL 09/25/2009  . ARTHRITIS 09/25/2009  . Vitamin D deficiency 09/27/2008  . Hyperlipidemia 09/17/2008  . Elevated blood pressure reading without diagnosis of hypertension 09/17/2008  . COLONIC POLYPS, HX OF 09/17/2008  . PERSONAL HISTORY DISEASES SKIN&SUBCUT TISSUE 09/17/2008    [facility] ,MS, CCC-SLP  12/08/2018, 4:31 PM  [facility] [address] [place], [place], [address] Phone: [phone]   Fax:  [phone]   Name: Pamela Alvarez MRN: [id number] Date of Birth: 05/26/1940

## 2018-12-14 ENCOUNTER — Ambulatory Visit: Payer: Medicare Other | Admitting: Occupational Therapy

## 2018-12-14 ENCOUNTER — Other Ambulatory Visit: Payer: Self-pay

## 2018-12-14 ENCOUNTER — Encounter: Payer: Self-pay | Admitting: Physical Therapy

## 2018-12-14 ENCOUNTER — Ambulatory Visit: Payer: Medicare Other | Admitting: Physical Therapy

## 2018-12-14 ENCOUNTER — Ambulatory Visit: Payer: Medicare Other

## 2018-12-14 DIAGNOSIS — R278 Other lack of coordination: Secondary | ICD-10-CM

## 2018-12-14 DIAGNOSIS — R2689 Other abnormalities of gait and mobility: Secondary | ICD-10-CM

## 2018-12-14 DIAGNOSIS — R41841 Cognitive communication deficit: Secondary | ICD-10-CM

## 2018-12-14 DIAGNOSIS — R41844 Frontal lobe and executive function deficit: Secondary | ICD-10-CM

## 2018-12-14 DIAGNOSIS — R482 Apraxia: Secondary | ICD-10-CM

## 2018-12-14 DIAGNOSIS — R2681 Unsteadiness on feet: Secondary | ICD-10-CM | POA: Diagnosis not present

## 2018-12-14 DIAGNOSIS — R41840 Attention and concentration deficit: Secondary | ICD-10-CM

## 2018-12-14 DIAGNOSIS — R41842 Visuospatial deficit: Secondary | ICD-10-CM

## 2018-12-14 DIAGNOSIS — R4701 Aphasia: Secondary | ICD-10-CM

## 2018-12-14 NOTE — Therapy (Signed)
[facility] [address] [place], [place], [address] Phone: [phone]   Fax:  [phone]  Speech Language Pathology Treatment  Patient Details  Name: Pamela ApleySandra Alvarez MRN: [id number] Date of Birth: 07/31/1940 Referring Provider (SLP): Dohmeier, Porfirio Mylararmen, MD   Encounter Date: 12/14/2018  End of Session - 12/14/18 1705    Visit Number  4    Number of Visits  17    Date for SLP Re-Evaluation  02/20/19    SLP Start Time  1403    SLP Stop Time   1445    SLP Time Calculation (min)  42 min    Activity Tolerance  Patient tolerated treatment well       Past Medical History:  Diagnosis Date  . Cyst 07-2008   Cyst on back x2 that were drained  . H/O echocardiogram 02-2000  . Hx of colonic polyps   . Hyperlipidemia   . Hypertension   . Varicose veins of both lower extremities     Past Surgical History:  Procedure Laterality Date  . POLYPECTOMY     Colon  . TONSILLECTOMY    . TUBAL LIGATION      There were no vitals filed for this visit.  Subjective Assessment - 12/14/18 1406    Subjective  "Actually at this time we play this game." (husband, re: the situation revolving around commnication breakdown)    Patient is accompained by:  Family member   husband           ADULT SLP TREATMENT - 12/14/18 1417      General Information   Behavior/Cognition  Alert;Cooperative;Pleasant mood;Requires cueing      Treatment Provided   Treatment provided  Cognitive-Linquistic      Cognitive-Linquistic Treatment   Treatment focused on  Aphasia    Skilled Treatment  SLP asked pt/husband about taking pictures of rooms and husband gave "s" explanation. SLP explained dx is not yet definitive and that "contingency planning" may be advised if dx is degenerative in nature. SLP worked with pt in Research officer, political partygenerating semantic connections between words with written support. Pt req'd mod A usually for fill in the blanks. Pt response often in  error using another word in the sentence (written support) or her previous answer.       Assessment / Recommendations / Plan   Plan  Continue with current plan of care      Progression Toward Goals   Progression toward goals  Progressing toward goals       SLP Education - 12/14/18 1704    Education Details  use pictures to begin to practice compenstions in the event that pt dx is degenerative in nature    Person(s) Educated  Patient;Spouse    Methods  Explanation    Comprehension  Verbalized understanding;Need further instruction       SLP Short Term Goals - 12/14/18 1711      SLP SHORT TERM GOAL #1   Title  pt will demo ability for functionalality with simple household langauge tasks over 3 sessions    Time  3    Period  Weeks    Status  On-going      SLP SHORT TERM GOAL #2   Title  pt will achieve functional langauge production in 8 minutes simple conversation with modifed independence (compensations) over two sessions    Time  3    Period  Weeks    Status  On-going      SLP SHORT TERM  GOAL #3   Title  pt will demo correct right/left comprehension in simple functional commands 9/10 opportunities    Time  3    Period  Weeks    Status  On-going      SLP SHORT TERM GOAL #4   Title  pt will complete full aphasia eval before total visit #3    Baseline  form 1 completed 12/06/18    Status  Achieved      SLP SHORT TERM GOAL #5   Title  pt will complete cognitive linguistic eval PRN if deemed clinically necessary    Time  2    Period  Weeks    Status  Revised       SLP Long Term Goals - 12/14/18 1712      SLP LONG TERM GOAL #1   Title  pt will demonstrate improved verbal communication (as rated by pt) compared to when ST began    Time  7    Period  Weeks    Status  On-going      SLP LONG TERM GOAL #2   Title  pt will report speech and language is less frustrating than prior to initiation of ST    Time  7    Period  Weeks    Status  On-going      SLP LONG TERM  GOAL #3   Title  pt will produce functional verbal expression in 8 minutes simple to mod complex conversation over 3 sessions    Time  7    Period  Weeks    Status  On-going      SLP LONG TERM GOAL #4   Title  pt will use multimodal communication PRN to express wants/needs    Time  7    Period  Weeks    Status  Revised      SLP LONG TERM GOAL #5   Title  pt will functionally write her first name independently in 7/10 attempts over 3 sessions    Time  7    Period  Weeks    Status  New       Plan - 12/14/18 1705    Clinical Impression Statement  Pt contnues to present as an atypical TBI pt or CVA pt with mild-moderate anomic aphasia per Western Aphasia Battery-Revised. Possible cognitive testing to occur in subsequent sessions. SLP recommends a course of skilled ST to address pt's decr'd language-cogniton ability/possible verbal apraxia.to incr. pt independence.    Speech Therapy Frequency  2x / week    Duration  --   8 weeks or 17 visits   Treatment/Interventions  SLP instruction and feedback;Compensatory strategies;Patient/family education;Multimodal communcation approach;Cueing hierarchy;Language facilitation;Functional tasks;Oral motor exercises;Internal/external aids;Environmental controls;Cognitive reorganization    Potential to Achieve Goals  Fair    Potential Considerations  Severity of impairments;Other (comment)   time post onset of symptoms/peculiar presentation given suspected TBI (suspect language skills to have improved and not declined over time)   Consulted and Agree with Plan of Care  Patient       Patient will benefit from skilled therapeutic intervention in order to improve the following deficits and impairments:   Aphasia  Cognitive communication deficit  Verbal apraxia    Problem List Patient Active Problem List   Diagnosis Date Noted  . Fluency disorder associated with underlying disease 02/08/2018  . Cognitive and neurobehavioral dysfunction  02/08/2018  . 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 Arthritis of carpometacarpal [facility]) joint of left thumb 01/24/2018  .  Ankle fracture, left 09/03/2016  . Hand dysfunction 03/11/2016  . Symptomatic varicose veins, bilateral 03/11/2016  . Numbness of left thumb 03/11/2016  . Elevated BP 07/27/2014  . Bunion of left foot 07/26/2014  . Abnormality of gait 07/26/2014  . Cough 11/10/2013  . Post-nasal drainage 11/10/2013  . Abnormal urine odor 11/10/2013  . Visit for preventive health examination 07/28/2013  . Right anterior knee pain 07/22/2012  . Medicare annual wellness visit, subsequent 07/25/2011  . ANXIETY, SITUATIONAL 09/25/2009  . ARTHRITIS 09/25/2009  . Vitamin D deficiency 09/27/2008  . Hyperlipidemia 09/17/2008  . Elevated blood pressure reading without diagnosis of hypertension 09/17/2008  . COLONIC POLYPS, HX OF 09/17/2008  . PERSONAL HISTORY DISEASES SKIN&SUBCUT TISSUE 09/17/2008    [facility] ,MS, CCC-SLP  12/14/2018, 5:14 PM  [facility] [address] [place], [place], [address] Phone: [phone]   Fax:  [phone]   Name: Pamela Alvarez MRN: [id number] Date of Birth: December 21, 1940

## 2018-12-14 NOTE — Patient Instructions (Signed)
  Please complete the assigned speech therapy homework prior to your next session and return it to the speech therapist at your next visit.  

## 2018-12-14 NOTE — Therapy (Signed)
[facility] [address] [place], [place], [address] Phone: [phone]   Fax:  [phone]  Physical Therapy Treatment  Patient Details  Name: Pamela Alvarez MRN: [id number] Date of Birth: [birth year] Referring Provider (PT): Dohmeier, Porfirio Mylar   Encounter Date: 12/14/2018  PT End of Session - 12/14/18 2045    Visit Number  4    Number of Visits  13    Date for PT Re-Evaluation  01/21/19    Authorization Type  [employer] Medicare-will need 10th visit progress note    PT Start Time  1447    PT Stop Time  1529    PT Time Calculation (min)  42 min    Equipment Utilized During Treatment  Gait belt    Activity Tolerance  Patient tolerated treatment well    Behavior During Therapy  Impulsive   Impulsive with transfers      Past Medical History:  Diagnosis Date  . Cyst 07-2008   Cyst on back x2 that were drained  . H/O echocardiogram 02-2000  . Hx of colonic polyps   . Hyperlipidemia   . Hypertension   . Varicose veins of both lower extremities     Past Surgical History:  Procedure Laterality Date  . POLYPECTOMY     Colon  . TONSILLECTOMY    . TUBAL LIGATION      There were no vitals filed for this visit.  Subjective Assessment - 12/14/18 1443    Subjective  Been doing some of the exercises everyday.  No falls.  Husband reports she walks with bigger steps when coming back into therapy.    Patient is accompained by:  Family member   Husband   Patient Stated Goals  Pt's goal for therapy is better balance and walking.    Currently in Pain?  No/denies                       [facility] Adult PT Treatment/Exercise - 12/14/18 1502      Transfers   Transfers  Sit to Stand;Stand to Sit    Sit to Stand  5: Supervision;4: Min guard;With upper extremity assist;From bed    Sit to Stand Details  Tactile cues for sequencing;Tactile cues for weight shifting;Tactile cues for placement;Verbal cues for technique;Verbal  cues for precautions/safety;Manual facilitation for placement    Stand to Sit  5: Supervision;With upper extremity assist;To bed    Comments  Cues for correct hand placement, for full turn to sit      Ambulation/Gait   Ambulation/Gait  Yes    Ambulation/Gait Assistance  4: Min guard    Ambulation/Gait Assistance Details  With rollator, pt needs cues to stay close to rollator; she is able to self-correct by bringing rollator closer to her.      Ambulation Distance (Feet)  115 Feet   230, then additional 150 ft around obstacles   Assistive device  Rollator    Gait Pattern  Step-to pattern;Step-through pattern;Decreased step length - right;Decreased step length - left;Shuffle;Decreased dorsiflexion - right;Decreased dorsiflexion - left    Ambulation Surface  Level;Indoor    Gait Comments  Discussed that pt would continue to need min guard to supervision for safety and technique if pt were to use rollator walker at home.  With turns and negotiation of furniture, she needs assist to keep rollator close by.          Balance Exercises - 12/14/18 1510      Balance  Exercises: Standing   Wall Bumps  Hip    Wall Bumps-Hips  Eyes opened;Anterior/posterior;10 reps   2 sets, bumping hips at posterior bar in parallel bars   Heel Raises Limitations  x 10 reps, 2 sets    Toe Raise Limitations  x 10 reps, 2 sets    Other Standing Exercises  Forward step over obstacle, 10 reps each leg for increased step length, with BUE support.  Stagger stance forward/back rocking x 10 reps each foot position with manual cues.  Side step over obstacle, 1 set x 10 reps.  Forward<>back step and weightshift x 10 reps with bilat UE support, constant cues for increased step length in posterior direction.  Cues for technique and for increased step length and height.  Forward kicks x 10 reps, then alternating forward kicks x 10 reps.  Pt with definite UE support and looking down at feet.  Attempted to use visual cues of mirror,  but pt continues to look down at feet.          PT Short Term Goals - 11/22/18 1257      PT SHORT TERM GOAL #1   Title  Pt will perform HEP with family supervision for improved transfers, balance, and gait.  TARGET 12/16/2018 (may be delayed due to later start with later scheduling)    Time  4    Period  Weeks    Status  New    Target Date  12/16/18      PT SHORT TERM GOAL #2   Title  Pt will perform 4 of 5 reps of sit<>stand transfers with correct technique, with husband supervision and cues, for improved safety with transfers.    Time  4    Period  Weeks    Status  New    Target Date  12/16/18      PT SHORT TERM GOAL #3   Title  Pt will improve Berg Balance score to at least 24/56 for decreased fall risk.    Time  4    Period  Weeks    Status  New    Target Date  12/16/18      PT SHORT TERM GOAL #4   Title  Pt will improve TUG score to less than or equal to 23 seconds for decreased fall risk.    Time  4    Period  Weeks    Status  New    Target Date  12/16/18      PT SHORT TERM GOAL #5   Title  Pt/husband will verbalize understanding of fall prevention in home environment.    Time  4    Period  Weeks    Status  New    Target Date  12/16/18        PT Long Term Goals - 12/14/18 2049      PT LONG TERM GOAL #1   Title  Pt will perform updated HEP for balance, gait, with husband's supervision.  TARGET 12/30/2018 (may be delayed due to delayed scheduling)    Time  6    Period  Weeks    Status  New      PT LONG TERM GOAL #2   Title  Pt will improve gait velocity to at  least 1.5 ft/sec with appropriate device and supervision, for improved gait efficiency.    Time  6    Period  Weeks    Status  New      PT LONG TERM GOAL #  3   Title  Pt/husband will verbalize/demonstrate understanding of appropriate floor>stand transfers for safe fall recovery.    Time  6    Period  Weeks    Status  New            Plan - 12/14/18 2046    Clinical Impression Statement   Skilled PT session today focused on gait training with rollator walker and balance exercises in parallel bars to attempt to work on increased stance time/step length.  Husband reports performing HEP at home and does not have questions about OTAGO.  Pt continues to need frequent>constant cues for transfer technique, safety with rollator and for technique with balance exercises.  Husband is supportive with providing assistance for gait and exercises.    Personal Factors and Comorbidities  Comorbidity 3+    Comorbidities  PMH includes ankle fracture, HTN, hyperlipidemia, recurrent falls    Examination-Activity Limitations  Squat;Stairs;Stand;Transfers;Locomotion Level    Examination-Participation Restrictions  Community Activity    Stability/Clinical Decision Making  Evolving/Moderate complexity    Rehab Potential  Fair   MMSE score 16/30; good family support   PT Frequency  2x / week    PT Duration  6 weeks   plus eval   PT Treatment/Interventions  ADLs/Self Care Home Management;Gait training;DME Instruction;Neuromuscular re-education;Balance training;Therapeutic exercise;Therapeutic activities;Functional mobility training;Patient/family education    PT Next Visit Plan  Review OTAGO exercises;   Continue weight shift/stepping activities; safety education will need to be provided to pt and husband.  Gait encouraging increased step length (?trial U-Step RW)    Consulted and Agree with Plan of Care  Patient;Family member/caregiver    Family Member Consulted  Husband       Patient will benefit from skilled therapeutic intervention in order to improve the following deficits and impairments:  Abnormal gait, Difficulty walking, Decreased safety awareness, Decreased balance, Decreased mobility  Visit Diagnosis: Other abnormalities of gait and mobility  Unsteadiness on feet     Problem List Patient Active Problem List   Diagnosis Date Noted  . Fluency disorder associated with underlying disease  02/08/2018  . Cognitive and neurobehavioral dysfunction 02/08/2018  . 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 Arthritis of carpometacarpal [facility]) joint of left thumb 01/24/2018  . Ankle fracture, left 09/03/2016  . Hand dysfunction 03/11/2016  . Symptomatic varicose veins, bilateral 03/11/2016  . Numbness of left thumb 03/11/2016  . Elevated BP 07/27/2014  . Bunion of left foot 07/26/2014  . Abnormality of gait 07/26/2014  . Cough 11/10/2013  . Post-nasal drainage 11/10/2013  . Abnormal urine odor 11/10/2013  . Visit for preventive health examination 07/28/2013  . Right anterior knee pain 07/22/2012  . Medicare annual wellness visit, subsequent 07/25/2011  . ANXIETY, SITUATIONAL 09/25/2009  . ARTHRITIS 09/25/2009  . Vitamin D deficiency 09/27/2008  . Hyperlipidemia 09/17/2008  . Elevated blood pressure reading without diagnosis of hypertension 09/17/2008  . COLONIC POLYPS, HX OF 09/17/2008  . PERSONAL HISTORY DISEASES SKIN&SUBCUT TISSUE 09/17/2008    Tydarius Yawn W. 12/14/2018, 8:51 PM  Mady Haagensen, PT 12/14/18 8:51 PM Phone: [phone] Fax: [place] [address] [place], [place], [address] Phone: [phone]   Fax:  [phone]  Name: Pamela Alvarez MRN: [id number] Date of Birth: September 27, 1940

## 2018-12-14 NOTE — Patient Instructions (Signed)
     Coordination Activities  Perform the following activities for 1 times per day with both hand(s).  May need to place oven mitt on right hand when trying to do activities with the left (sitting)   Rotate ball in fingertips.  Can place on table when using left hand    Flip cards 1 at a time   Pick up coins and place in container or coin bank.   Pick up and stack checkers or dominoes.   Squeeze putty with your whole hand 10x.  Squeeze hard.   Roll putty out with both hands.  Then pinch putty.  Repeat 2x with each hand.   Place 5-10 buttons, beads, coins in putty and remove using both hands.

## 2018-12-14 NOTE — Therapy (Signed)
[facility] [address] [place], [place], [address] Phone: [phone]   Fax:  [phone]  Occupational Therapy Treatment  Patient Details  Name: Pamela Alvarez MRN: [id number] Date of Birth: 04/15/1940 No data recorded  Encounter Date: 12/14/2018  OT End of Session - 12/14/18 1321    Visit Number  2    Number of Visits  13    Date for OT Re-Evaluation  01/06/19    Authorization Type  [employer] Medicare, no auth    Authorization - Visit Number  1    Authorization - Number of Visits  10    OT Start Time  1318    OT Stop Time  1400    OT Time Calculation (min)  42 min    Activity Tolerance  Patient tolerated treatment well    Behavior During Therapy  Restless;Impulsive       Past Medical History:  Diagnosis Date  . Cyst 07-2008   Cyst on back x2 that were drained  . H/O echocardiogram 02-2000  . Hx of colonic polyps   . Hyperlipidemia   . Hypertension   . Varicose veins of both lower extremities     Past Surgical History:  Procedure Laterality Date  . POLYPECTOMY     Colon  . TONSILLECTOMY    . TUBAL LIGATION      There were no vitals filed for this visit.  Subjective Assessment - 12/14/18 1320    Subjective   Pt denies pain or falls    Patient is accompanied by:  Family member    Pertinent History  78 y.o female with recent declining cognitive abilities, per MRI -right frontal, right parietal and right cerebellar encephalomalacia and gliosis, possible embolic chronic ischemic infarcts.Pt has hx of ankle fx, hyperlipidemia, and thumb CMC arthritis. Per pt's husband MD is still trying to determine a definative diagnosis, per chart, possible PSP vs. TBI.    Limitations  fall risk    Patient Stated Goals  improved use of L hand, to be as independent as possible    Currently in Pain?  No/denies         Pt wrote name with significant micrographia.  Tried Tracing name (written with highlighter) with  hand-over-hand assist with min-mod difficulty.  Then, tracing vertical lines with min-mod cues/mod difficulty using built-up foam grip as well (with improvements).      OT Education - 12/14/18 1437    Education Details  Coordination & [employer] HEP; Issued practice sheet for pre-writing (vertical lines, "t", circles, and "S") for pt to trace    Person(s) Educated  Patient    Methods  Explanation    Comprehension  Verbalized understanding       OT Short Term Goals - 11/22/18 1422      OT SHORT TERM GOAL #1   Title  Pt/ caregiver will be I with HEP.    Time  4    Period  Weeks    Status  New    Target Date  12/22/18      OT SHORT TERM GOAL #2   Title  Pt/ husband will verbalize understanding of adapted strategies to maximize safety and independence with ADLs/ IADLs.    Time  4    Period  Weeks    Status  New      OT SHORT TERM GOAL #3   Title  Pt will demonstrate ability to retrieve a lightweight object at 100 shoulder flexion with LUE.  Time  4    Period  Weeks    Status  New      OT SHORT TERM GOAL #4   Title  Pt will demonstrate ability to retrieve a lightweight object at 115 shoulder flexion with RUE.    Time  4    Period  Weeks    Status  New        OT Long Term Goals - 11/22/18 1424      OT LONG TERM GOAL #1   Title  Pt will increase LUE funtional use and reach as eveidenced by increasing LUE box/ blocks to 15 blocks.    Time  8    Period  Weeks    Status  New      OT LONG TERM GOAL #2   Title  Pt will increase LUE grip strength to 25 lbs or greater for increased ease with ADLS.    Time  8    Period  Weeks    Status  New      OT LONG TERM GOAL #3   Title  Pt will demonstrate ability to write her name legibly with good letter size x 3  trials.    Time  8    Period  Weeks    Status  New      OT LONG TERM GOAL #4   Title  Pt will demonstrate ability to retrieve a lightweight object aty 105 shoulder flexion with LUE.    Time  8    Period  Weeks     Status  New            Plan - 12/14/18 1431    Clinical Impression Statement  Pt demo improved LUE functional use with RUE mildly constrained (use of oven mitt over R hand) and improved pre-writing with hand-over-hand and tracing.    OT Occupational Profile and History  Detailed Assessment- Review of Records and additional review of physical, cognitive, psychosocial history related to current functional performance    Occupational performance deficits (Please refer to evaluation for details):  ADL's;IADL's;Leisure;Work;Social Participation    Body Structure / Function / Physical Skills  ADL;UE functional use;Balance;Flexibility;Vision;FMC;ROM;Gait;Coordination;GMC;Sensation;Decreased knowledge of precautions;Decreased knowledge of use of DME;IADL;Strength;Dexterity;Mobility    Cognitive Skills  Attention;Learn;Memory;Problem Solve;Safety Awareness;Sequencing;Thought;Understand    Rehab Potential  Fair    Clinical Decision Making  Several treatment options, min-mod task modification necessary   extra cueing due to aphasia, demonstration   Comorbidities Affecting Occupational Performance:  May have comorbidities impacting occupational performance    Modification or Assistance to Complete Evaluation   Min-Moderate modification of tasks or assist with assess necessary to complete eval   increased time and instruction   OT Frequency  2x / week   aticipate d/c after 4-6 weeks dependent upon pt progress.   OT Duration  8 weeks    OT Treatment/Interventions  Self-care/ADL training;Energy conservation;Visual/perceptual remediation/compensation;Patient/family education;DME and/or AE instruction;Aquatic Therapy;Paraffin;Passive range of motion;Balance training;Fluidtherapy;Cryotherapy;Therapist, nutritional;Therapeutic activities;Manual Therapy;Therapeutic exercise;Moist Heat;Neuromuscular education;Cognitive remediation/compensation    Plan  ?closed chain cane/ball HEP in supine vs. sitting,  strategies for ADLs    Consulted and Agree with Plan of Care  Patient;Family member/caregiver    Family Member Consulted  husband       Patient will benefit from skilled therapeutic intervention in order to improve the following deficits and impairments:   Body Structure / Function / Physical Skills: ADL, UE functional use, Balance, Flexibility, Vision, FMC, ROM, Gait, Coordination, GMC, Sensation, Decreased knowledge of precautions,  Decreased knowledge of use of DME, IADL, Strength, Dexterity, Mobility Cognitive Skills: Attention, Learn, Memory, Problem Solve, Safety Awareness, Sequencing, Thought, Understand     Visit Diagnosis: Other lack of coordination  Unsteadiness on feet  Other abnormalities of gait and mobility  Frontal lobe and executive function deficit  Attention and concentration deficit  Visuospatial deficit    Problem List Patient Active Problem List   Diagnosis Date Noted  . Fluency disorder associated with underlying disease 02/08/2018  . Cognitive and neurobehavioral dysfunction 02/08/2018  . 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 Arthritis of carpometacarpal [facility] joint of left thumb 01/24/2018  . Ankle fracture, left 09/03/2016  . Hand dysfunction 03/11/2016  . Symptomatic varicose veins, bilateral 03/11/2016  . Numbness of left thumb 03/11/2016  . Elevated BP 07/27/2014  . Bunion of left foot 07/26/2014  . Abnormality of gait 07/26/2014  . Cough 11/10/2013  . Post-nasal drainage 11/10/2013  . Abnormal urine odor 11/10/2013  . Visit for preventive health examination 07/28/2013  . Right anterior knee pain 07/22/2012  . Medicare annual wellness visit, subsequent 07/25/2011  . ANXIETY, SITUATIONAL 09/25/2009  . ARTHRITIS 09/25/2009  . Vitamin D deficiency 09/27/2008  . Hyperlipidemia 09/17/2008  . Elevated blood pressure reading without diagnosis of hypertension 09/17/2008  . COLONIC POLYPS, HX OF 09/17/2008  . PERSONAL HISTORY DISEASES  SKIN&SUBCUT TISSUE 09/17/2008    [facility]AN, 12/14/2018, 2:43 PM  [facility] [address] [place], [place], [address] Phone: [phone]   Fax:  [phone]  Name: [name] MRN: [id number] Date of Birth: 08/17/1940   Willa FraterAngela , OTR/L [facility] [address] [place], [place]  [address] [phone] phone [phone] 12/14/18 2:43 PM

## 2018-12-16 ENCOUNTER — Other Ambulatory Visit: Payer: Self-pay

## 2018-12-16 ENCOUNTER — Ambulatory Visit: Payer: Medicare Other

## 2018-12-16 ENCOUNTER — Ambulatory Visit: Payer: Medicare Other | Admitting: Physical Therapy

## 2018-12-16 ENCOUNTER — Ambulatory Visit: Payer: Medicare Other | Admitting: Occupational Therapy

## 2018-12-16 DIAGNOSIS — R41840 Attention and concentration deficit: Secondary | ICD-10-CM

## 2018-12-16 DIAGNOSIS — R41841 Cognitive communication deficit: Secondary | ICD-10-CM

## 2018-12-16 DIAGNOSIS — R4701 Aphasia: Secondary | ICD-10-CM

## 2018-12-16 DIAGNOSIS — R41844 Frontal lobe and executive function deficit: Secondary | ICD-10-CM

## 2018-12-16 DIAGNOSIS — R482 Apraxia: Secondary | ICD-10-CM

## 2018-12-16 DIAGNOSIS — M6281 Muscle weakness (generalized): Secondary | ICD-10-CM

## 2018-12-16 DIAGNOSIS — R41842 Visuospatial deficit: Secondary | ICD-10-CM

## 2018-12-16 DIAGNOSIS — R2689 Other abnormalities of gait and mobility: Secondary | ICD-10-CM

## 2018-12-16 DIAGNOSIS — R2681 Unsteadiness on feet: Secondary | ICD-10-CM | POA: Diagnosis not present

## 2018-12-16 DIAGNOSIS — R278 Other lack of coordination: Secondary | ICD-10-CM

## 2018-12-16 NOTE — Therapy (Signed)
[facility] [address] [place], [place], [address] Phone: [phone]   Fax:  [phone]  Speech Language Pathology Treatment  Patient Details  Name: Pamela ApleySandra Alvarez MRN: [id number] Date of Birth: 11/15/1940 Referring Provider (SLP): Dohmeier, Porfirio Mylararmen, MD   Encounter Date: 12/16/2018  End of Session - 12/16/18 1147    Visit Number  5    Number of Visits  17    Date for SLP Re-Evaluation  02/20/19    SLP Start Time  1105    SLP Stop Time   1145    SLP Time Calculation (min)  40 min    Activity Tolerance  Patient tolerated treatment well       Past Medical History:  Diagnosis Date  . Cyst 07-2008   Cyst on back x2 that were drained  . H/O echocardiogram 02-2000  . Hx of colonic polyps   . Hyperlipidemia   . Hypertension   . Varicose veins of both lower extremities     Past Surgical History:  Procedure Laterality Date  . POLYPECTOMY     Colon  . TONSILLECTOMY    . TUBAL LIGATION      There were no vitals filed for this visit.  Subjective Assessment - 12/16/18 1104    Subjective  Husbnad reports pt easily frustrated with speech homework tasks at home with him.    Patient is accompained by:  Family member   husband   Currently in Pain?  No/denies            ADULT SLP TREATMENT - 12/16/18 1105      General Information   Behavior/Cognition  Pleasant mood;Cooperative;Alert;Distractible;Requires cueing      Treatment Provided   Treatment provided  Cognitive-Linquistic      Cognitive-Linquistic Treatment   Treatment focused on  Aphasia    Skilled Treatment  SLP worked with high interest, highly salient words with pt's children/grandchildren and their cities of residence - pt req'd consistnet max A for spouses names and for grandchildren names. SL Psuggested to husband to set up a book with these names for pt to look at and be able to name - with written support pt stated names with rare min A when   questioned about specific children. SLP learned pt is/was avid reader - SLP suggested pt listen to books of authors she enjoyed and husband and pt "discuss/recap" names, plot points, events in book using written support approx every 5-10 minutes, given pt's "s" statement.       Assessment / Recommendations / Plan   Plan  Continue with current plan of care      Progression Toward Goals   Progression toward goals  Progressing toward goals   pt amenable to practicing at home (recap of book)      SLP Education - 12/16/18 1147    Education Details  how to structure work at home on expressive language, names for family make it easier for pt to call the names    Person(s) Educated  Patient;Spouse    Methods  Explanation;Demonstration;Verbal cues    Comprehension  Verbalized understanding;Returned demonstration;Verbal cues required;Need further instruction       SLP Short Term Goals - 12/16/18 1149      SLP SHORT TERM GOAL #1   Title  pt will demo ability for functionalality with simple household langauge tasks (e.g., grocery list, errand list, to-do list) over 3 sessions    Time  3    Period  Weeks  Status  On-going      SLP SHORT TERM GOAL #2   Title  pt will achieve functional communication of wants/needs at home (reported, if needed) over two sessions    Time  3    Period  Weeks    Status  Revised      SLP SHORT TERM GOAL #3   Title  pt will demo correct right/left comprehension in simple functional commands 9/10 opportunities    Time  3    Period  Weeks    Status  On-going      SLP SHORT TERM GOAL #4   Title  pt will complete full aphasia eval before total visit #3    Baseline  form 1 completed 12/06/18    Status  Achieved      SLP SHORT TERM GOAL #5   Title  pt will complete cognitive linguistic eval PRN if deemed clinically necessary    Time  2    Period  Weeks    Status  Revised       SLP Long Term Goals - 12/16/18 1151      SLP LONG TERM GOAL #1   Title  pt  will demonstrate improved verbal communication (as rated by pt) compared to when ST began    Time  7    Period  Weeks    Status  On-going      SLP LONG TERM GOAL #2   Title  pt will report speech and language is less frustrating than prior to initiation of ST    Time  7    Period  Weeks    Status  On-going      SLP LONG TERM GOAL #3   Title  pt will produce functional verbal expression in 3 minutes simple conversation over 3 sessions    Time  7    Period  Weeks    Status  On-going      SLP LONG TERM GOAL #4   Title  pt will use multimodal communication PRN to express wants/needs    Time  7    Period  Weeks    Status  Revised      SLP LONG TERM GOAL #5   Title  pt will functionally write her first name independently in 7/10 attempts over 3 sessions    Time  7    Period  Weeks    Status  New       Plan - 12/16/18 1148    Clinical Impression Statement  Pt contnues to present as an atypical TBI pt or CVA pt with mild-moderate (more moderate) anomic aphasia per Western Aphasia Battery-Revised. Possible cognitive testing to occur in subsequent sessions. See "skilled intervention" for details on today's session. Per husband, pt very reluctant/resistant to do any traditional ST work at home with husband. SLP recommends a course of skilled ST to address pt's decr'd language-cogniton ability/possible verbal apraxia.to incr. pt independence.    Speech Therapy Frequency  2x / week    Duration  --   8 weeks or 17 visits   Treatment/Interventions  SLP instruction and feedback;Compensatory strategies;Patient/family education;Multimodal communcation approach;Cueing hierarchy;Language facilitation;Functional tasks;Oral motor exercises;Internal/external aids;Environmental controls;Cognitive reorganization    Potential to Achieve Goals  Fair    Potential Considerations  Severity of impairments;Other (comment)   time post onset of symptoms/peculiar presentation given suspected TBI (suspect  language skills to have improved and not declined over time)   Consulted and Agree with Plan of Care  Patient       Patient will benefit from skilled therapeutic intervention in order to improve the following deficits and impairments:   Aphasia  Verbal apraxia  Cognitive communication deficit    Problem List Patient Active Problem List   Diagnosis Date Noted  . Fluency disorder associated with underlying disease 02/08/2018  . Cognitive and neurobehavioral dysfunction 02/08/2018  . 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 Arthritis of carpometacarpal [facility]) joint of left thumb 01/24/2018  . Ankle fracture, left 09/03/2016  . Hand dysfunction 03/11/2016  . Symptomatic varicose veins, bilateral 03/11/2016  . Numbness of left thumb 03/11/2016  . Elevated BP 07/27/2014  . Bunion of left foot 07/26/2014  . Abnormality of gait 07/26/2014  . Cough 11/10/2013  . Post-nasal drainage 11/10/2013  . Abnormal urine odor 11/10/2013  . Visit for preventive health examination 07/28/2013  . Right anterior knee pain 07/22/2012  . Medicare annual wellness visit, subsequent 07/25/2011  . ANXIETY, SITUATIONAL 09/25/2009  . ARTHRITIS 09/25/2009  . Vitamin D deficiency 09/27/2008  . Hyperlipidemia 09/17/2008  . Elevated blood pressure reading without diagnosis of hypertension 09/17/2008  . COLONIC POLYPS, HX OF 09/17/2008  . PERSONAL HISTORY DISEASES SKIN&SUBCUT TISSUE 09/17/2008    [facility] ,MS, CCC-SLP  12/16/2018, 11:52 AM  [facility] [name] [place], [place], [address] Phone: [phone]   Fax:  [phone]   Name: Pamela Alvarez MRN: [id number] Date of Birth: 1940-10-06

## 2018-12-16 NOTE — Therapy (Signed)
[facility] [address] [place], [place], [address] Phone: [phone]   Fax:  [phone]  Physical Therapy Treatment  Patient Details  Name: Pamela ApleySandra Alvarez MRN: [id number] Date of Birth: 04/05/1940 Referring Provider (PT): Dohmeier, Porfirio Mylararmen   Encounter Date: 12/16/2018  PT End of Session - 12/16/18 1253    Visit Number  5    Number of Visits  13    Date for PT Re-Evaluation  01/21/19    Authorization Type  [employer] Medicare-will need 10th visit progress note    PT Start Time  1019    PT Stop Time  1100    PT Time Calculation (min)  41 min    Equipment Utilized During Treatment  Gait belt    Activity Tolerance  Patient tolerated treatment well    Behavior During Therapy  Impulsive   Impulsive with transfers      Past Medical History:  Diagnosis Date  . Cyst 07-2008   Cyst on back x2 that were drained  . H/O echocardiogram 02-2000  . Hx of colonic polyps   . Hyperlipidemia   . Hypertension   . Varicose veins of both lower extremities     Past Surgical History:  Procedure Laterality Date  . POLYPECTOMY     Colon  . TONSILLECTOMY    . TUBAL LIGATION      There were no vitals filed for this visit.  Subjective Assessment - 12/16/18 1033    Subjective  "It's tiring." re: walking today.  Pt asking about using a cane instead of rollator in the house.    Patient is accompained by:  Family member   Husband   Patient Stated Goals  Pt's goal for therapy is better balance and walking.    Currently in Pain?  No/denies          [facility] - 12/16/18 0001      Transfers   Transfers  Sit to Stand;Stand to Sit    Sit to Stand  5: Supervision;4: Min guard;With upper extremity assist;From bed    Sit to Stand Details  Tactile cues for weight beaing;Visual cues/gestures for precautions/safety;Verbal cues for technique;Manual facilitation for weight shifting;Tactile cues for weight shifting    Stand to Sit  5: Supervision;With upper extremity assist;To bed    Number of Reps  10 reps;Other sets (comment)   2 sets non compliant, 2 sets on compliant surface   Transfer Cueing  cues to push up from chair/reach back to chair with sit<>stand.  cues for forward weight shift and to avoid pushing back against chair/mat.    Comments  Cues for correct hand placement, for full turn to sit      Ambulation/Gait   Ambulation/Gait  Yes    Ambulation/Gait Assistance  4: Min guard    Ambulation/Gait Assistance Details  pt requiring cues for locking/unlocking brakes.  Needs cues for turns/backing up to chair.  Pt appears slightly steadier with Ustep than rollator.    Ambulation Distance (Feet)  110 Feet   x 5 reps   Assistive device  Rollator;Other (Comment)   Ustep walker   Gait Pattern  Step-to pattern;Step-through pattern;Decreased step length - right;Decreased step length - left;Shuffle;Decreased dorsiflexion - right;Decreased dorsiflexion - left    Ambulation Surface  Level;Indoor    Pre-Gait Activities  forward/backward step weight shifting, stepping over/back across beam in floor, marching in place, backward walking-all in // bars with bil UE support.  Tried 1 UE support but  pt fearful and still needed PTA's hand and 1 rail      Posture/Postural Control   Posture/Postural Control  Postural limitations    Postural Limitations  Forward head      High Level Balance   High Level Balance Activities  Other (comment)    High Level Balance Comments  standing taps to cones alternating LE's, needed 1 UE support and min-mod to balance at times.  Pt fearful of falling and difficulty with SLS      Exercises   Exercises  Knee/Hip      Knee/Hip Exercises: Standing   Heel Raises  Both;1 set;10 reps;1 second    Hip Flexion  Both;2 sets;10 reps;Knee bent;Knee straight   performed 1 set knee bent, 1 set knee straight   Hip Flexion Limitations  needed bil UE support               PT Short Term  Goals - 11/22/18 1257      PT SHORT TERM GOAL #1   Title  Pt will perform HEP with family supervision for improved transfers, balance, and gait.  TARGET 12/16/2018 (may be delayed due to later start with later scheduling)    Time  4    Period  Weeks    Status  New    Target Date  12/16/18      PT SHORT TERM GOAL #2   Title  Pt will perform 4 of 5 reps of sit<>stand transfers with correct technique, with husband supervision and cues, for improved safety with transfers.    Time  4    Period  Weeks    Status  New    Target Date  12/16/18      PT SHORT TERM GOAL #3   Title  Pt will improve Berg Balance score to at least 24/56 for decreased fall risk.    Time  4    Period  Weeks    Status  New    Target Date  12/16/18      PT SHORT TERM GOAL #4   Title  Pt will improve TUG score to less than or equal to 23 seconds for decreased fall risk.    Time  4    Period  Weeks    Status  New    Target Date  12/16/18      PT SHORT TERM GOAL #5   Title  Pt/husband will verbalize understanding of fall prevention in home environment.    Time  4    Period  Weeks    Status  New    Target Date  12/16/18        PT Long Term Goals - 12/14/18 2049      PT LONG TERM GOAL #1   Title  Pt will perform updated HEP for balance, gait, with husband's supervision.  TARGET 12/30/2018 (may be delayed due to delayed scheduling)    Time  6    Period  Weeks    Status  New      PT LONG TERM GOAL #2   Title  Pt will improve gait velocity to at  least 1.5 ft/sec with appropriate device and supervision, for improved gait efficiency.    Time  6    Period  Weeks    Status  New      PT LONG TERM GOAL #3   Title  Pt/husband will verbalize/demonstrate understanding of appropriate floor>stand transfers for safe fall recovery.    Time  6  Period  Weeks    Status  New            Plan - 12/16/18 1253    Clinical Impression Statement  Skilled session focused on gait, pregait and balance activities.   Pt continues to have difficulty with SLS and can not perform without min-mod assist/UE support.  Pt continues to have difficulty following commands at times but does appear to have some carry over from previous sessions.  Continue PT per POC.    Personal Factors and Comorbidities  Comorbidity 3+    Comorbidities  PMH includes ankle fracture, HTN, hyperlipidemia, recurrent falls    Examination-Activity Limitations  Squat;Stairs;Stand;Transfers;Locomotion Level    Examination-Participation Restrictions  Community Activity    Stability/Clinical Decision Making  Evolving/Moderate complexity    Rehab Potential  Fair   MMSE score 16/30; good family support   PT Frequency  2x / week    PT Duration  6 weeks   plus eval   PT Treatment/Interventions  ADLs/Self Care Home Management;Gait training;DME Instruction;Neuromuscular re-education;Balance training;Therapeutic exercise;Therapeutic activities;Functional mobility training;Patient/family education    PT Next Visit Plan  Continue weight shift/stepping activities; safety education will need to be provided to pt and husband.  Gait encouraging increased step length (Rollator vs U-Step RW)    Consulted and Agree with Plan of Care  Patient;Family member/caregiver    Family Member Consulted  Husband       Patient will benefit from skilled therapeutic intervention in order to improve the following deficits and impairments:  Abnormal gait, Difficulty walking, Decreased safety awareness, Decreased balance, Decreased mobility  Visit Diagnosis: Unsteadiness on feet  Other abnormalities of gait and mobility  Other lack of coordination     Problem List Patient Active Problem List   Diagnosis Date Noted  . Fluency disorder associated with underlying disease 02/08/2018  . Cognitive and neurobehavioral dysfunction 02/08/2018  . 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 Arthritis of carpometacarpal [facility]) joint of left thumb 01/24/2018  . Ankle fracture,  left 09/03/2016  . Hand dysfunction 03/11/2016  . Symptomatic varicose veins, bilateral 03/11/2016  . Numbness of left thumb 03/11/2016  . Elevated BP 07/27/2014  . Bunion of left foot 07/26/2014  . Abnormality of gait 07/26/2014  . Cough 11/10/2013  . Post-nasal drainage 11/10/2013  . Abnormal urine odor 11/10/2013  . Visit for preventive health examination 07/28/2013  . Right anterior knee pain 07/22/2012  . Medicare annual wellness visit, subsequent 07/25/2011  . ANXIETY, SITUATIONAL 09/25/2009  . ARTHRITIS 09/25/2009  . Vitamin D deficiency 09/27/2008  . Hyperlipidemia 09/17/2008  . Elevated blood pressure reading without diagnosis of hypertension 09/17/2008  . COLONIC POLYPS, HX OF 09/17/2008  . PERSONAL HISTORY DISEASES SKIN&SUBCUT TISSUE 09/17/2008    Newell Coral, PTA [facility] 12/16/18 12:57 PM Phone: [phone] Fax: [phone]   [facility] [place] [facility] [address] [place], [place], [address] Phone: [phone]   Fax:  [phone]  Name: Pamela Alvarez MRN: [id number] Date of Birth: 1940-11-21

## 2018-12-16 NOTE — Therapy (Signed)
[facility] [address] [place], [place], [address] Phone: [phone]   Fax:  [phone]  Occupational Therapy Treatment  Patient Details  Name: Pamela ApleySandra Mercadel MRN: [id number] Date of Birth: 02/18/1941 No data recorded  Encounter Date: 12/16/2018  OT End of Session - 12/16/18 1558    Visit Number  3    Number of Visits  13    Date for OT Re-Evaluation  01/06/19    Authorization Type  [employer] Medicare, no auth    Authorization - Visit Number  3    Authorization - Number of Visits  10    OT Start Time  0935    OT Stop Time  1015    OT Time Calculation (min)  40 min    Activity Tolerance  Patient tolerated treatment well    Behavior During Therapy  Restless;Impulsive       Past Medical History:  Diagnosis Date  . Cyst 07-2008   Cyst on back x2 that were drained  . H/O echocardiogram 02-2000  . Hx of colonic polyps   . Hyperlipidemia   . Hypertension   . Varicose veins of both lower extremities     Past Surgical History:  Procedure Laterality Date  . POLYPECTOMY     Colon  . TONSILLECTOMY    . TUBAL LIGATION      There were no vitals filed for this visit.  Subjective Assessment - 12/16/18 1557    Patient is accompanied by:  Family member    Pertinent History  78 y.o female with recent declining cognitive abilities, per MRI -right frontal, right parietal and right cerebellar encephalomalacia and gliosis, possible embolic chronic ischemic infarcts.Pt has hx of ankle fx, hyperlipidemia, and thumb CMC arthritis. Per pt's husband MD is still trying to determine a definative diagnosis, per chart, possible PSP vs. TBI.    Limitations  fall risk    Patient Stated Goals  improved use of L hand, to be as independent as possible    Currently in Pain?  No/denies            Treatment: Therapist provided education for pt. And husband regarding use of gestural/ tactile cues hen needed as well as questioning cues if  pt is donning hsirt backwards. Pt's husband reports pt gets very frustrated with him when he prompts her. Handwriting activities, tracing shapes with highlighter, then tracing and writing her name, mod v.c/ mod-max difficulty               OT Education - 12/16/18 1557    Education Details  Cane exercises, see pt instructions, 10 -20 reps each, increased time required.    Person(s) Educated  Patient    Methods  Explanation    Comprehension  Verbalized understanding       OT Short Term Goals - 11/22/18 1422      OT SHORT TERM GOAL #1   Title  Pt/ caregiver will be I with HEP.    Time  4    Period  Weeks    Status  New    Target Date  12/22/18      OT SHORT TERM GOAL #2   Title  Pt/ husband will verbalize understanding of adapted strategies to maximize safety and independence with ADLs/ IADLs.    Time  4    Period  Weeks    Status  New      OT SHORT TERM GOAL #3   Title  Pt will  demonstrate ability to retrieve a lightweight object at 100 shoulder flexion with LUE.    Time  4    Period  Weeks    Status  New      OT SHORT TERM GOAL #4   Title  Pt will demonstrate ability to retrieve a lightweight object at 115 shoulder flexion with RUE.    Time  4    Period  Weeks    Status  New        OT Long Term Goals - 11/22/18 1424      OT LONG TERM GOAL #1   Title  Pt will increase LUE funtional use and reach as eveidenced by increasing LUE box/ blocks to 15 blocks.    Time  8    Period  Weeks    Status  New      OT LONG TERM GOAL #2   Title  Pt will increase LUE grip strength to 25 lbs or greater for increased ease with ADLS.    Time  8    Period  Weeks    Status  New      OT LONG TERM GOAL #3   Title  Pt will demonstrate ability to write her name legibly with good letter size x 3  trials.    Time  8    Period  Weeks    Status  New      OT LONG TERM GOAL #4   Title  Pt will demonstrate ability to retrieve a lightweight object aty 105 shoulder flexion with  LUE.    Time  8    Period  Weeks    Status  New            Plan - 12/16/18 1558    Clinical Impression Statement  Pt demo improved LUE functional use with RUE mildly constrained (use of oven mitt over R hand) and improved pre-writing with hand-over-hand and tracing.    OT Occupational Profile and History  Detailed Assessment- Review of Records and additional review of physical, cognitive, psychosocial history related to current functional performance    Occupational performance deficits (Please refer to evaluation for details):  ADL's;IADL's;Leisure;Work;Social Participation    Body Structure / Function / Physical Skills  ADL;UE functional use;Balance;Flexibility;Vision;FMC;ROM;Gait;Coordination;GMC;Sensation;Decreased knowledge of precautions;Decreased knowledge of use of DME;IADL;Strength;Dexterity;Mobility    Cognitive Skills  Attention;Learn;Memory;Problem Solve;Safety Awareness;Sequencing;Thought;Understand    Rehab Potential  Fair    Clinical Decision Making  Several treatment options, min-mod task modification necessary   extra cueing due to aphasia, demonstration   Comorbidities Affecting Occupational Performance:  May have comorbidities impacting occupational performance    Modification or Assistance to Complete Evaluation   Min-Moderate modification of tasks or assist with assess necessary to complete eval   increased time and instruction   OT Frequency  2x / week   aticipate d/c after 4-6 weeks dependent upon pt progress.   OT Duration  8 weeks    OT Treatment/Interventions  Self-care/ADL training;Energy conservation;Visual/perceptual remediation/compensation;Patient/family education;DME and/or AE instruction;Aquatic Therapy;Paraffin;Passive range of motion;Balance training;Fluidtherapy;Cryotherapy;Building services engineer;Therapeutic activities;Manual Therapy;Therapeutic exercise;Moist Heat;Neuromuscular education;Cognitive remediation/compensation    Plan  ?closed chain  cane/ball HEP in supine vs. sitting, strategies for ADLs    Consulted and Agree with Plan of Care  Patient;Family member/caregiver    Family Member Consulted  husband       Patient will benefit from skilled therapeutic intervention in order to improve the following deficits and impairments:   Body Structure / Function / Physical Skills: ADL,  UE functional use, Balance, Flexibility, Vision, FMC, ROM, Gait, Coordination, GMC, Sensation, Decreased knowledge of precautions, Decreased knowledge of use of DME, IADL, Strength, Dexterity, Mobility Cognitive Skills: Attention, Learn, Memory, Problem Solve, Safety Awareness, Sequencing, Thought, Understand     Visit Diagnosis: No diagnosis found.    Problem List Patient Active Problem List   Diagnosis Date Noted  . Fluency disorder associated with underlying disease 02/08/2018  . Cognitive and neurobehavioral dysfunction 02/08/2018  . 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 Arthritis of carpometacarpal [employer]) joint of left thumb 01/24/2018  . Ankle fracture, left 09/03/2016  . Hand dysfunction 03/11/2016  . Symptomatic varicose veins, bilateral 03/11/2016  . Numbness of left thumb 03/11/2016  . Elevated BP 07/27/2014  . Bunion of left foot 07/26/2014  . Abnormality of gait 07/26/2014  . Cough 11/10/2013  . Post-nasal drainage 11/10/2013  . Abnormal urine odor 11/10/2013  . Visit for preventive health examination 07/28/2013  . Right anterior knee pain 07/22/2012  . Medicare annual wellness visit, subsequent 07/25/2011  . ANXIETY, SITUATIONAL 09/25/2009  . ARTHRITIS 09/25/2009  . Vitamin D deficiency 09/27/2008  . Hyperlipidemia 09/17/2008  . Elevated blood pressure reading without diagnosis of hypertension 09/17/2008  . COLONIC POLYPS, HX OF 09/17/2008  . PERSONAL HISTORY DISEASES SKIN&SUBCUT TISSUE 09/17/2008    Hager Compston 12/16/2018, 3:59 PM  [facility] [address]  [place], [place], [address] Phone: [phone]   Fax:  [phone]  Name: Michell Giuliano MRN: [id number] Date of Birth: [birth year]

## 2018-12-16 NOTE — Patient Instructions (Signed)
Listen to 5-10 minutes of your book and then recap with using words and phrases WRITTEN down on whiteboard or notebook

## 2018-12-16 NOTE — Patient Instructions (Signed)
   Lie on back holding wand. Raise arms over head. Hold 5sec. Repeat 10 times per set.  Do 2-3 sessions per day.   Press-Up With Wand   Press wand up until elbows are straight, then reach wand over head to a pain free range. Hold 5 seconds. Repeat 10 times. Do 2-3 sessions per day.      Cane Overhead - Sitting only   Sit-With arms straight, hold cane forward at waist. Raise cane above head. Hold 3 seconds. Repeat 10 times. Do 2 times per day.

## 2018-12-20 ENCOUNTER — Encounter: Payer: Self-pay | Admitting: Occupational Therapy

## 2018-12-20 ENCOUNTER — Ambulatory Visit: Payer: Medicare Other | Admitting: Physical Therapy

## 2018-12-20 ENCOUNTER — Ambulatory Visit: Payer: Medicare Other | Admitting: Occupational Therapy

## 2018-12-20 ENCOUNTER — Ambulatory Visit: Payer: Medicare Other | Admitting: Speech Pathology

## 2018-12-20 ENCOUNTER — Other Ambulatory Visit: Payer: Self-pay

## 2018-12-20 DIAGNOSIS — M6281 Muscle weakness (generalized): Secondary | ICD-10-CM

## 2018-12-20 DIAGNOSIS — R41842 Visuospatial deficit: Secondary | ICD-10-CM

## 2018-12-20 DIAGNOSIS — R482 Apraxia: Secondary | ICD-10-CM

## 2018-12-20 DIAGNOSIS — R41840 Attention and concentration deficit: Secondary | ICD-10-CM

## 2018-12-20 DIAGNOSIS — R2681 Unsteadiness on feet: Secondary | ICD-10-CM

## 2018-12-20 DIAGNOSIS — R41844 Frontal lobe and executive function deficit: Secondary | ICD-10-CM

## 2018-12-20 DIAGNOSIS — R4701 Aphasia: Secondary | ICD-10-CM

## 2018-12-20 DIAGNOSIS — R2689 Other abnormalities of gait and mobility: Secondary | ICD-10-CM

## 2018-12-20 DIAGNOSIS — R278 Other lack of coordination: Secondary | ICD-10-CM

## 2018-12-20 DIAGNOSIS — R41841 Cognitive communication deficit: Secondary | ICD-10-CM

## 2018-12-20 NOTE — Therapy (Addendum)
[facility] [name] [place], [place], [address] Phone: [phone]   Fax:  [phone]  Occupational Therapy Treatment  Patient Details  Name: Pamela ApleySandra Alvarez MRN: [id number] Date of Birth: 11/23/1940 No data recorded  Encounter Date: 12/20/2018  OT End of Session - 12/20/18 1232    Visit Number  4    Number of Visits  13    Date for OT Re-Evaluation  01/06/19    Authorization Type  [employer] Medicare, no auth    Authorization - Visit Number  4    Authorization - Number of Visits  10    OT Start Time  1236    OT Stop Time  1317    OT Time Calculation (min)  41 min    Activity Tolerance  Patient tolerated treatment well    Behavior During Therapy  Restless;Impulsive       Past Medical History:  Diagnosis Date  . Cyst 07-2008   Cyst on back x2 that were drained  . H/O echocardiogram 02-2000  . Hx of colonic polyps   . Hyperlipidemia   . Hypertension   . Varicose veins of both lower extremities     Past Surgical History:  Procedure Laterality Date  . POLYPECTOMY     Colon  . TONSILLECTOMY    . TUBAL LIGATION      There were no vitals filed for this visit.  Subjective Assessment - 12/20/18 1232    Subjective   she's doing better with her left hand with the coordination HEP, writing improved, reaching some with her LUE per husband and reaching higher with LUE.   Pt reports that husband doesn't let her do much.  (I'm not the most patient--per husband, but husband reports performing HEP regularly and that it's helping)    Patient is accompanied by:  Family member    Pertinent History  78 y.o female with recent declining cognitive abilities, per MRI -right frontal, right parietal and right cerebellar encephalomalacia and gliosis, possible embolic chronic ischemic infarcts.Pt has hx of ankle fx, hyperlipidemia, and thumb CMC arthritis. Per pt's husband MD is still trying to determine a definative diagnosis, per  chart, possible PSP vs. TBI.    Limitations  fall risk    Patient Stated Goals  improved use of L hand, to be as independent as possible    Currently in Pain?  No/denies         Educated husband (and pt) on safe activities that pt can do to incr dominant LUE functional use and participation in ADLs/IADLs):    Recommended that pt sits in shower (they have a seat) and allow pt to wash as much as possible with assist/supervision only when standing to wash private area or with transfer.  Recommended that husband let pt attempt to wash hair with husband only completing activity as needed as LUE ROM improved.     Recommended pt incr participation in seated kitchen activities (such as opening bottles/jars, pouring/dumping ingredients in bowl, stirring, using scissors to open packages, wipe table, use child-safe knives to cut fruit/some veggies--showed husband/pt options online to order, wash vegetables, fold clothes/put clothes on hangers in sitting).  Practiced opening jar, cutting paper towel with scissors (along line) with min cueing initially and then improved accuracy/success with repetition, pouring water from one cup to another with min cueing, scooping rice with measuring cup to put in bowl with min cueing.  All for incr bilateral/LUE functional use and participation in IADLs.  Practiced tracing simple shapes (circle, square, zig-zags, vertical lines, crosses, curved lines, name).  Then pt was able to write name with good size (with min cueing) and approx 85% legibility (legibility decr due to overlapping with text above and extra "l").    Reviewed cane HEP.  Pt returned demo with min cueing for timing of movement.       OT Education - 12/20/18 1440    Education Details  Educated husband (and pt) on safe activities that pt can do to incr dominant LUE functional use and participation in ADLs/IADLs)    Person(s) Educated  Patient;Spouse    Methods  Explanation;Demonstration;Verbal cues     Comprehension  Verbalized understanding;Returned demonstration       OT Short Term Goals - 11/22/18 1422      OT SHORT TERM GOAL #1   Title  Pt/ caregiver will be I with HEP.    Time  4    Period  Weeks    Status  New    Target Date  12/22/18      OT SHORT TERM GOAL #2   Title  Pt/ husband will verbalize understanding of adapted strategies to maximize safety and independence with ADLs/ IADLs.    Time  4    Period  Weeks    Status  New      OT SHORT TERM GOAL #3   Title  Pt will demonstrate ability to retrieve a lightweight object at 100 shoulder flexion with LUE.    Time  4    Period  Weeks    Status  New      OT SHORT TERM GOAL #4   Title  Pt will demonstrate ability to retrieve a lightweight object at 115 shoulder flexion with RUE.    Time  4    Period  Weeks    Status  New        OT Long Term Goals - 11/22/18 1424      OT LONG TERM GOAL #1   Title  Pt will increase LUE funtional use and reach as eveidenced by increasing LUE box/ blocks to 15 blocks.    Time  8    Period  Weeks    Status  New      OT LONG TERM GOAL #2   Title  Pt will increase LUE grip strength to 25 lbs or greater for increased ease with ADLS.    Time  8    Period  Weeks    Status  New      OT LONG TERM GOAL #3   Title  Pt will demonstrate ability to write her name legibly with good letter size x 3  trials.    Time  8    Period  Weeks    Status  New      OT LONG TERM GOAL #4   Title  Pt will demonstrate ability to retrieve a lightweight object aty 105 shoulder flexion with LUE.    Time  8    Period  Weeks    Status  New            Plan - 12/20/18 1233    Clinical Impression Statement  Pt is progressing well with improved LUE functional use, improved LUE reaching, incr attention to LUE, and improved ability to write name.    OT Occupational Profile and History  Detailed Assessment- Review of Records and additional review of physical, cognitive, psychosocial history related to  current functional  performance    Occupational performance deficits (Please refer to evaluation for details):  ADL's;IADL's;Leisure;Work;Social Participation    Body Structure / Function / Physical Skills  ADL;UE functional use;Balance;Flexibility;Vision;FMC;ROM;Gait;Coordination;GMC;Sensation;Decreased knowledge of precautions;Decreased knowledge of use of DME;IADL;Strength;Dexterity;Mobility    Cognitive Skills  Attention;Learn;Memory;Problem Solve;Safety Awareness;Sequencing;Thought;Understand    Rehab Potential  Fair    Clinical Decision Making  Several treatment options, min-mod task modification necessary   extra cueing due to aphasia, demonstration   Comorbidities Affecting Occupational Performance:  May have comorbidities impacting occupational performance    Modification or Assistance to Complete Evaluation   Min-Moderate modification of tasks or assist with assess necessary to complete eval   increased time and instruction   OT Frequency  2x / week   aticipate d/c after 4-6 weeks dependent upon pt progress.   OT Duration  8 weeks    OT Treatment/Interventions  Self-care/ADL training;Energy conservation;Visual/perceptual remediation/compensation;Patient/family education;DME and/or AE instruction;Aquatic Therapy;Paraffin;Passive range of motion;Balance training;Fluidtherapy;Cryotherapy;Building services engineer;Therapeutic activities;Manual Therapy;Therapeutic exercise;Moist Heat;Neuromuscular education;Cognitive remediation/compensation    Plan  strategies for ADLs, LUE functional use (forced use), ?standing with reaching/simple standing IADL with supervision    Consulted and Agree with Plan of Care  Patient;Family member/caregiver    Family Member Consulted  husband       Patient will benefit from skilled therapeutic intervention in order to improve the following deficits and impairments:   Body Structure / Function / Physical Skills: ADL, UE functional use, Balance, Flexibility,  Vision, FMC, ROM, Gait, Coordination, GMC, Sensation, Decreased knowledge of precautions, Decreased knowledge of use of DME, IADL, Strength, Dexterity, Mobility Cognitive Skills: Attention, Learn, Memory, Problem Solve, Safety Awareness, Sequencing, Thought, Understand     Visit Diagnosis: Frontal lobe and executive function deficit  Attention and concentration deficit  Visuospatial deficit  Other lack of coordination  Muscle weakness (generalized)  Unsteadiness on feet  Other abnormalities of gait and mobility    Problem List Patient Active Problem List   Diagnosis Date Noted  . Fluency disorder associated with underlying disease 02/08/2018  . Cognitive and neurobehavioral dysfunction 02/08/2018  . 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 Arthritis of carpometacarpal [facility]) joint of left thumb 01/24/2018  . Ankle fracture, left 09/03/2016  . Hand dysfunction 03/11/2016  . Symptomatic varicose veins, bilateral 03/11/2016  . Numbness of left thumb 03/11/2016  . Elevated BP 07/27/2014  . Bunion of left foot 07/26/2014  . Abnormality of gait 07/26/2014  . Cough 11/10/2013  . Post-nasal drainage 11/10/2013  . Abnormal urine odor 11/10/2013  . Visit for preventive health examination 07/28/2013  . Right anterior knee pain 07/22/2012  . Medicare annual wellness visit, subsequent 07/25/2011  . ANXIETY, SITUATIONAL 09/25/2009  . ARTHRITIS 09/25/2009  . Vitamin D deficiency 09/27/2008  . Hyperlipidemia 09/17/2008  . Elevated blood pressure reading without diagnosis of hypertension 09/17/2008  . COLONIC POLYPS, HX OF 09/17/2008  . PERSONAL HISTORY DISEASES SKIN&SUBCUT TISSUE 09/17/2008    [facility] 12/20/2018, 2:46 PM  [facility] [address] [place], [place], [address] Phone: [phone]   Fax:  [phone]  Name: Shalunda Lindh MRN: [id number] Date of Birth: 1940-07-11   Willa Frater, OTR/L [facility] [address] [place], [place]  [address] [phone] phone [phone] 12/20/18 2:46 PM

## 2018-12-20 NOTE — Patient Instructions (Signed)
[employer] News: you will need to sign up and create a username/ password. The app is free. Short news articles with audio and comprehension questions. Read together and then have a conversation about the article. You can use paper or a whiteboard to write down key words (names of people, places, main concepts) for [place] to reference during your conversation.

## 2018-12-21 NOTE — Therapy (Signed)
[place] [address] [place], [place], [address] Phone: [phone]   Fax:  [phone]  Physical Therapy Treatment  Patient Details  Name: Pamela Alvarez MRN: [id number] Date of Birth: 1940-09-17 Referring Provider (PT): Dohmeier, Asencion Partridge   Encounter Date: 12/20/2018  PT End of Session - 12/21/18 1420    Visit Number  6    Number of Visits  13    Date for PT Re-Evaluation  01/21/19    Authorization Type  [employer] Medicare-will need 10th visit progress note    PT Start Time  1151    PT Stop Time  1230    PT Time Calculation (min)  39 min    Equipment Utilized During Treatment  Gait belt    Activity Tolerance  Patient tolerated treatment well    Behavior During Therapy  Impulsive   Impulsive with transfers      Past Medical History:  Diagnosis Date  . Cyst 07-2008   Cyst on back x2 that were drained  . H/O echocardiogram 02-2000  . Hx of colonic polyps   . Hyperlipidemia   . Hypertension   . Varicose veins of both lower extremities     Past Surgical History:  Procedure Laterality Date  . POLYPECTOMY     Colon  . TONSILLECTOMY    . TUBAL LIGATION      There were no vitals filed for this visit.  Subjective Assessment - 12/20/18 1151    Subjective  No changes, no pain today.  Husband reports to try to work to transfers, as she still has difficulty.    Patient is accompained by:  Family member   Husband   Patient Stated Goals  Pt's goal for therapy is better balance and walking.    Currently in Pain?  No/denies                       [facility] Adult PT Treatment/Exercise - 12/20/18 1155      Transfers   Transfers  Sit to Stand;Stand to Sit    Sit to Stand  5: Supervision;4: Min guard;With upper extremity assist;From bed    Sit to Stand Details  Tactile cues for weight beaing;Visual cues/gestures for precautions/safety;Verbal cues for technique;Manual facilitation for weight shifting;Tactile  cues for weight shifting    Sit to Stand Details (indicate cue type and reason)  Pt has initial forward lean, then pushes posteriorly with legs at mat. (so significantly that mat moves posteriorly)    Stand to Sit  5: Supervision;With upper extremity assist;To bed;To chair/3-in-1    Stand to Sit Details (indicate cue type and reason)  Tactile cues for sequencing;Tactile cues for placement;Verbal cues for sequencing;Verbal cues for technique    Transfer Cueing  Additional 5 reps sit<>stand from low surface (14" plus Airex), simulating sofa, with cues for 1, 2, 3 forward lean to stand with min assist.    Comments  Practiced sit to stand from mat surface, then chair, 10 reps each (with additional reps that pt performs without instructions given).  PT provides tactil and verbal cues for correct, slowed technique      Knee/Hip Exercises: Seated   Other Seated Knee/Hip Exercises  SEated ankle pumps, 2 sets x 10 reps.  Attempted seated push ups for increased forward lean, working on lifting buttocks, not pushing posteriorly against mat/chair.  Attempted x 5 reps, pt unable to follow directions for this correct technique.  Balance Exercises - 12/21/18 1414      Balance Exercises: Standing   Rockerboard  Anterior/posterior;EO   Ankle/hip strategy work on rockerboard with min assist   Balance Beam  Standing on balance beam with feet shoulder width apart:  forward step taps x 10 reps, 2 sets to encourage anterior excursion of lower leg over ankle (to simulate for improved forward lean with sit to stand)    Retro Gait  Upper extremity support;5 reps   Fwd, back in parallel bars, cues for weightshift, step lengh   Other Standing Exercises  For backward stepping in bars, pt requires tactile cues at hips to increase lateral weigthshifting for increased posterior step length.          PT Short Term Goals - 12/21/18 1421      PT SHORT TERM GOAL #1   Title  Pt will perform HEP with family  supervision for improved transfers, balance, and gait.  UPDATED TARGET 12/30/2018 ( due to later start with later scheduling)    Time  4    Period  Weeks    Status  On-going      PT SHORT TERM GOAL #2   Title  Pt will perform 4 of 5 reps of sit<>stand transfers with correct technique, with husband supervision and cues, for improved safety with transfers.    Time  4    Period  Weeks    Status  On-going      PT SHORT TERM GOAL #3   Title  Pt will improve Berg Balance score to at least 24/56 for decreased fall risk.    Time  4    Period  Weeks    Status  On-going      PT SHORT TERM GOAL #4   Title  Pt will improve TUG score to less than or equal to 23 seconds for decreased fall risk.    Time  4    Period  Weeks    Status  On-going      PT SHORT TERM GOAL #5   Title  Pt/husband will verbalize understanding of fall prevention in home environment.    Time  4    Period  Weeks    Status  On-going        PT Long Term Goals - 12/21/18 1422      PT LONG TERM GOAL #1   Title  Pt will perform updated HEP for balance, gait, with husband's supervision.  UPDATED TARGET 111/08/2018 (delayed due to delayed scheduling)    Time  6    Period  Weeks    Status  On-going      PT LONG TERM GOAL #2   Title  Pt will improve gait velocity to at  least 1.5 ft/sec with appropriate device and supervision, for improved gait efficiency.    Time  6    Period  Weeks    Status  On-going      PT LONG TERM GOAL #3   Title  Pt/husband will verbalize/demonstrate understanding of appropriate floor>stand transfers for safe fall recovery.    Time  6    Period  Weeks    Status  On-going            Plan - 12/21/18 1423    Clinical Impression Statement  Continued to focus on transfer training from varied surfaces, with pt continueing to have impulsivity with sit to stand, with significantly pushing posterior through backs of legs.  Also addressed balance and  posterior weigthshifting/stepping in parallel  bars, with pt having significant difficulty with foot clearance/step length in posterior direction.  Pt will benefit from further skilled PT, with education to husband for transfer, balance and gait safety training.    Personal Factors and Comorbidities  Comorbidity 3+    Comorbidities  PMH includes ankle fracture, HTN, hyperlipidemia, recurrent falls    Examination-Activity Limitations  Squat;Stairs;Stand;Transfers;Locomotion Level    Examination-Participation Restrictions  Community Activity    Stability/Clinical Decision Making  Evolving/Moderate complexity    Rehab Potential  Fair   MMSE score 16/30; good family support   PT Frequency  2x / week    PT Duration  6 weeks   plus eval   PT Treatment/Interventions  ADLs/Self Care Home Management;Gait training;DME Instruction;Neuromuscular re-education;Balance training;Therapeutic exercise;Therapeutic activities;Functional mobility training;Patient/family education    PT Next Visit Plan  Continue weight shift/stepping activities, especially in posterior direction; fall prevention education.  Gait encouraging increased step length (forward and back direction).  Check STGs next week    Consulted and Agree with Plan of Care  Patient;Family member/caregiver    Family Member Consulted  Husband       Patient will benefit from skilled therapeutic intervention in order to improve the following deficits and impairments:  Abnormal gait, Difficulty walking, Decreased safety awareness, Decreased balance, Decreased mobility  Visit Diagnosis: Unsteadiness on feet  Muscle weakness (generalized)  Other abnormalities of gait and mobility     Problem List Patient Active Problem List   Diagnosis Date Noted  . Fluency disorder associated with underlying disease 02/08/2018  . Cognitive and neurobehavioral dysfunction 02/08/2018  . 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 Arthritis of carpometacarpal [facility] joint of left thumb 01/24/2018  . Ankle  fracture, left 09/03/2016  . Hand dysfunction 03/11/2016  . Symptomatic varicose veins, bilateral 03/11/2016  . Numbness of left thumb 03/11/2016  . Elevated BP 07/27/2014  . Bunion of left foot 07/26/2014  . Abnormality of gait 07/26/2014  . Cough 11/10/2013  . Post-nasal drainage 11/10/2013  . Abnormal urine odor 11/10/2013  . Visit for preventive health examination 07/28/2013  . Right anterior knee pain 07/22/2012  . Medicare annual wellness visit, subsequent 07/25/2011  . ANXIETY, SITUATIONAL 09/25/2009  . ARTHRITIS 09/25/2009  . Vitamin D deficiency 09/27/2008  . Hyperlipidemia 09/17/2008  . Elevated blood pressure reading without diagnosis of hypertension 09/17/2008  . COLONIC POLYPS, HX OF 09/17/2008  . PERSONAL HISTORY DISEASES SKIN&SUBCUT TISSUE 09/17/2008    Reema Chick W. 12/21/2018, 2:29 PM  Gean MaidensMARRIOTT,Sparrow Sanzo W., PT   [facility] [address] [place], [place], [address] Phone: [phone]   Fax:  [phone]  Name: Pamela Alvarez MRN: [id number] Date of Birth: 12/06/1940

## 2018-12-21 NOTE — Progress Notes (Signed)
Chief Complaint  Patient presents with  . Annual Exam    Pt has no  concerns t  . Medication Management  . Hypertension    HPI: Pamela Alvarez 78 y.o. comes in today for Preventive Medicare exam/ wellness visit  Here with spouse .   Lipid  meds taking  atorva  2  Per week   dementia  Poss trau,matic  Bi and  snp under pt OT has helped her left hand use    Colon cancer screening   Not needed . Per gi so help off   BP losartan seems to help bp  Range  130 at home  Low dose   Taking b12 supplement daily   Last neuro eval note and assessments  Eye-movement abnormalities now most like associated with the gliosis and encephalomalcia and not neurodegeneratve. Vascular dementia.  Slowed EEG,   abnormal gliosis or encephalomalacia in MRI - could this be an AVM? Fall related head trauma? Carotid vaso-vascular embolism?   Neurodegenerative disease process versus vascular injury to CNS - gliosis confirms the trauma is most likely the cause- see MRI brain.  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   Left thumb clumsiness.   Health Maintenance  Topic Date Due  . DEXA SCAN  12/15/2004  . TETANUS/TDAP  07/08/2018  . INFLUENZA VACCINE  Completed  . PNA vac Low Risk Adult  Completed   Health Maintenance Review LIFESTYLE:  Exercise:   3 therapies 3 x per week.  pilates on zoom   Some weights.  Tobacco/ETS: no Alcohol:   Rare to no Sugar beverages:  ocass   Some weet tea.  Sleep: 8-9 sleeps  Drug use: no HH:  2  No pets    Hearing:   " Ok "  Vision:  No limitations at present . Last eye check UTD  Safety:  Has smoke detector and wears seat belts.  No firearms. No excess sun exposure. Sees dentist regularly.  Falls:  In therapy .   Memory: decreased as per neuro note poverty of speech  Stable?  Depression: No anhedonia unusual crying or  depressive symptoms  Nutrition: Eats well balanced diet; adequate calcium and vitamin D. No swallowing chewing problems.  Other healthcare providers:  Reviewed today .  Preventive parameters: up-to-date  Reviewed  ADLS:   There are no problems or need for assistance  , , dressing, toileting and bathing,  Husband helps at tub she has balance issues and risk of fall     ROS:  GEN/ HEENT: No fever, significant weight changes sweats headaches vision problems hearing changes, CV/ PULM; No chest pain shortness of breath cough, syncope,edema  change in exercise tolerance. GI /GU: No adominal pain, vomiting, change in bowel habits. No blood in the stool. No significant GU symptoms. SKIN/HEME: ,no acute skin rashes suspicious lesions or bleeding. No lymphadenopathy, nodules, masses.  NEURO/ PSYCH:  Nneuro  See past hx  No new sx  Weakness  Using left hand more with ot IMM/ Allergy: No unusual infections.  Allergy .   REST of 12 system review negative except as per HPI   Past Medical History:  Diagnosis Date  . Cyst 07-2008   Cyst on back x2 that were drained  . H/O echocardiogram 02-2000  . Hx of colonic polyps   . Hyperlipidemia   . Hypertension   . Varicose veins of  both lower extremities     Family History  Problem Relation Age of Onset  . Hypertension Mother   . Parkinsonism Mother   . Angina Father   . Heart disease Father   . Colon cancer Father   . Lung cancer Father   . Skin cancer Father   . Cancer Father        Heart  . Breast cancer Sister        over 52  . Diabetes Sister   . Heart disease Brother 23       triple bypass surgery  . Macular degeneration Sister   . Diabetes Sister        prediabetic  . Hearing loss Sister        hearing problems  . Vasculitis Sister     Social History   Socioeconomic History  . Marital status: Married    Spouse name: [name]  . Number of children: 2  . Years of education: Not on file  . Highest education level: Doctorate   Occupational History  . Occupation: retired    Comment: Actor  Social Needs  . Financial resource strain: Not on file  . Food insecurity    Worry: Not on file    Inability: Not on file  . Transportation needs    Medical: Not on file    Non-medical: Not on file  Tobacco Use  . Smoking status: Never Smoker  . Smokeless tobacco: Never Used  Substance and Sexual Activity  . Alcohol use: Yes    Alcohol/week: 7.0 standard drinks    Types: 7 Glasses of wine per week    Comment: one a day  . Drug use: Not Currently  . Sexual activity: Not on file  Lifestyle  . Physical activity    Days per week: Not on file    Minutes per session: Not on file  . Stress: Not on file  Relationships  . Social Musician on phone: Not on file    Gets together: Not on file    Attends religious service: Not on file    Active member of club or organization: Not on file    Attends meetings of clubs or organizations: Not on file    Relationship status: Not on file  Other Topics Concern  . Not on file  Social History Narrative   Regular exercise-yes   Moved back to [place] from [place] of 2 cat   G3 P2    Exercises walking regu;arly    Retired  Social research officer, government   Drinks one cup of coffee  A day. In addition to walking QD she and her husband take a pilates class on Thursdays. She lives with her husband in a 2 story house, though the master bedroom in on the first level.        Outpatient Encounter Medications as of 12/23/2018  Medication Sig  . atorvastatin (LIPITOR) 10 MG tablet Take 10 mg by mouth daily.  . cyanocobalamin 1000 MCG tablet Take 1,000 mcg by mouth daily.  Marland Kitchen losartan (COZAAR) 25 MG tablet Take 1 tablet (25 mg total) by mouth daily.   No facility-administered encounter medications on file as of 12/23/2018.     EXAM:  BP 130/80 (BP Location: Right Arm, Patient Position: Sitting, Cuff Size: Normal)   Pulse 86   Temp 97.6 F (36.4 C) (Temporal)   Ht  5' 1.5" (1.562 m)   Wt 148 lb 9.6 oz (  67.4 kg)   SpO2 97%   BMI 27.62 kg/m   Body mass index is 27.62 kg/m.  Physical Exam: Vital signs reviewed ZOX:WRUE is a well-developed well-nourished alert cooperative   who appears stated age in no acute distress.  Little speech but interactive help getting on to table  HEENT: normocephalic atraumatic , Eyes: PERRL EOM's full, conjunctiva clear, Nares: paten,t no deformity discharge or tenderness., Ears: no deformity EAC's clear TMs with normal landmarks. Mouth: clear OP,masked  NECK: supple without masses, thyromegaly or bruits. CHEST/PULM:  Clear to auscultation and percussion breath sounds equal no wheeze , rales or rhonchi. No chest wall deformities or tenderness. CV: PMI is nondisplaced, S1 S2 no gallops, murmurs, rubs. Peripheral pulses are full without delay.No JVD .  ABDOMEN: Bowel sounds normal nontender  No guard or rebound, no hepato splenomegal no CVA tenderness.   Extremtities:  No clubbing cyanosis or edema, no acute joint swelling or redness no focal atrophy feet some  Venous changes   Acro cyanosis  No raynauds  NEURO:   Interactive hard to do [employer] all  extermieis independent gait needs help up and down form table SKIN: No acute rashes normal turgor, color, no bruising or petechiae. PSYCH:  good eye contact, no obvious depression anxiety cooperative   Spouse in room with help motor wise  LN: no cervical axillary inguinal adenopathy No noted deficits in memory, attention, and speech.   Lab Results  Component Value Date   WBC 7.6 12/23/2018   HGB 14.8 12/23/2018   HCT 44.3 12/23/2018   PLT 284.0 12/23/2018   GLUCOSE 93 12/23/2018   CHOL 254 (H) 12/23/2018   TRIG 73.0 12/23/2018   HDL 68.90 12/23/2018   LDLDIRECT 191.8 07/22/2012   LDLCALC 171 (H) 12/23/2018   ALT 16 12/23/2018   AST 19 12/23/2018   NA 141 12/23/2018   K 4.4 12/23/2018   CL 107 12/23/2018   CREATININE 0.98 12/23/2018   BUN 15 12/23/2018   CO2 24  12/23/2018   TSH 2.57 12/23/2018   HGBA1C 5.7 12/23/2018    ASSESSMENT AND PLAN:  Discussed the following assessment and plan:  Visit for preventive health examination  Medication management - Plan: Basic metabolic panel, CBC with Differential/Platelet, Hemoglobin A1c, Hepatic function panel, Lipid panel, TSH  Essential hypertension - controlled  - Plan: Basic metabolic panel, CBC with Differential/Platelet, Hemoglobin A1c, Hepatic function panel, Lipid panel, TSH  Hyperlipidemia, unspecified hyperlipidemia type - taking 2-3 datys per week  - Plan: Basic metabolic panel, CBC with Differential/Platelet, Hemoglobin A1c, Hepatic function panel, Lipid panel, TSH  Need for immunization against influenza - Plan: Flu Vaccine QUAD 36+ mos IM  Estrogen deficiency - Plan: DG Bone Density, Basic metabolic panel, CBC with Differential/Platelet, Hemoglobin A1c, Hepatic function panel, Lipid panel, TSH  Elevated blood sugar - Plan: Basic metabolic panel, CBC with Differential/Platelet, Hemoglobin A1c, Hepatic function panel, Lipid panel, TSH  Vitamin D deficiency - Plan: Vitamin B12, VITAMIN D 25 Hydroxy (Vit-D Deficiency, Fractures)  Vitamin B12 deficiency low nl  - Plan: Vitamin B12 Under neuro care   disc dexa if decides  And reasoning    Shared Decision Making option to make appt  Patient Care Team: Panosh, Neta Mends, MD as PCP - Benay Pike, MD (Gastroenterology) Elise Benne, MD (Ophthalmology) Venancio Poisson, MD as Attending Physician (Dermatology) Enid Baas, MD as Consulting Physician (Sports Medicine) Teryl Lucy, MD as Consulting Physician (Orthopedic Surgery) Andrena Mews, DO as Consulting Physician (Sports  Medicine) [place] Mutaarroll, Lauren, PT as Physical Therapist (Physical Therapy) Dohmeier, Porfirio Mylararmen, MD as Consulting Physician (Neurology)  Patient Instructions  Will notify you  of labs when available.   Bone density  If needed .    Health Maintenance, Female  Adopting a healthy lifestyle and getting preventive care are important in promoting health and wellness. Ask your health care provider about:  The right schedule for you to have regular tests and exams.  Things you can do on your own to prevent diseases and keep yourself healthy. What should I know about diet, weight, and exercise? Eat a healthy diet   Eat a diet that includes plenty of vegetables, fruits, low-fat dairy products, and lean protein.  Do not eat a lot of foods that are high in solid fats, added sugars, or sodium. Maintain a healthy weight Body mass index (BMI) is used to identify weight problems. It estimates body fat based on height and weight. Your health care provider can help determine your BMI and help you achieve or maintain a healthy weight. Get regular exercise Get regular exercise. This is one of the most important things you can do for your health. Most adults should:  Exercise for at least 150 minutes each week. The exercise should increase your heart rate and make you sweat (moderate-intensity exercise).  Do strengthening exercises at least twice a week. This is in addition to the moderate-intensity exercise.  Spend less time sitting. Even light physical activity can be beneficial. Watch cholesterol and blood lipids Have your blood tested for lipids and cholesterol at 79 years of age, then have this test every 5 years. Have your cholesterol levels checked more often if:  Your lipid or cholesterol levels are high.  You are older than 79 years of age.  You are at high risk for heart disease. What should I know about cancer screening? Depending on your health history and family history, you may need to have cancer screening at various ages. This may include screening for:  Breast cancer.  Cervical cancer.  Colorectal cancer.  Skin cancer.  Lung cancer. What should I know about heart disease, diabetes, and high blood pressure? Blood pressure and  heart disease  High blood pressure causes heart disease and increases the risk of stroke. This is more likely to develop in people who have high blood pressure readings, are of African descent, or are overweight.  Have your blood pressure checked: ? Every 3-5 years if you are 6618-79 years of age. ? Every year if you are 79 years old or older. Diabetes Have regular diabetes screenings. This checks your fasting blood sugar level. Have the screening done:  Once every three years after age 79 if you are at a normal weight and have a low risk for diabetes.  More often and at a younger age if you are overweight or have a high risk for diabetes. What should I know about preventing infection? Hepatitis B If you have a higher risk for hepatitis B, you should be screened for this virus. Talk with your health care provider to find out if you are at risk for hepatitis B infection. Hepatitis C Testing is recommended for:  Everyone born from [birth year] through 1966.  Anyone with known risk factors for hepatitis C. Sexually transmitted infections (STIs)  Get screened for STIs, including gonorrhea and chlamydia, if: ? You are sexually active and are younger than 79 years of age. ? You are older than 79 years of age and your health care  provider tells you that you are at risk for this type of infection. ? Your sexual activity has changed since you were last screened, and you are at increased risk for chlamydia or gonorrhea. Ask your health care provider if you are at risk.  Ask your health care provider about whether you are at high risk for HIV. Your health care provider may recommend a prescription medicine to help prevent HIV infection. If you choose to take medicine to prevent HIV, you should first get tested for HIV. You should then be tested every 3 months for as long as you are taking the medicine. Pregnancy  If you are about to stop having your period (premenopausal) and you may become pregnant, seek  counseling before you get pregnant.  Take 400 to 800 micrograms (mcg) of folic acid every day if you become pregnant.  Ask for birth control (contraception) if you want to prevent pregnancy. Osteoporosis and menopause Osteoporosis is a disease in which the bones lose minerals and strength with aging. This can result in bone fractures. If you are 26 years old or older, or if you are at risk for osteoporosis and fractures, ask your health care provider if you should:  Be screened for bone loss.  Take a calcium or vitamin D supplement to lower your risk of fractures.  Be given hormone replacement therapy (HRT) to treat symptoms of menopause. Follow these instructions at home: Lifestyle  Do not use any products that contain nicotine or tobacco, such as cigarettes, e-cigarettes, and chewing tobacco. If you need help quitting, ask your health care provider.  Do not use street drugs.  Do not share needles.  Ask your health care provider for help if you need support or information about quitting drugs. Alcohol use  Do not drink alcohol if: ? Your health care provider tells you not to drink. ? You are pregnant, may be pregnant, or are planning to become pregnant.  If you drink alcohol: ? Limit how much you use to 0-1 drink a day. ? Limit intake if you are breastfeeding.  Be aware of how much alcohol is in your drink. In the [place] one drink equals one 12 oz bottle of beer (355 mL), one 5 oz glass of wine (148 mL), or one 1 oz glass of hard liquor (44 mL). General instructions  Schedule regular health, dental, and eye exams.  Stay current with your vaccines.  Tell your health care provider if: ? You often feel depressed. ? You have ever been abused or do not feel safe at home. Summary  Adopting a healthy lifestyle and getting preventive care are important in promoting health and wellness.  Follow your health care provider's instructions about healthy diet, exercising, and getting  tested or screened for diseases.  Follow your health care provider's instructions on monitoring your cholesterol and blood pressure. This information is not intended to replace advice given to you by your health care provider. Make sure you discuss any questions you have with your health care provider. Document Released: 09/08/2010 Document Revised: 02/16/2018 Document Reviewed: 02/16/2018 Elsevier Patient Education  2020 [place] Panosh M.D.

## 2018-12-21 NOTE — Therapy (Signed)
[facility] [address] [place], [place], [address] Phone: [phone]   Fax:  [phone]  Speech Language Pathology Treatment  Patient Details  Name: Pamela Alvarez MRN: [id number] Date of Birth: Apr 21, 1940 Referring Provider (SLP): Dohmeier, Asencion Partridge, MD   Encounter Date: 12/20/2018  End of Session - 12/21/18 0824    Visit Number  6    Number of Visits  17    Date for SLP Re-Evaluation  02/20/19    SLP Start Time  1103    SLP Stop Time   1145    SLP Time Calculation (min)  42 min    Activity Tolerance  Patient tolerated treatment well       Past Medical History:  Diagnosis Date  . Cyst 07-2008   Cyst on back x2 that were drained  . H/O echocardiogram 02-2000  . Hx of colonic polyps   . Hyperlipidemia   . Hypertension   . Varicose veins of both lower extremities     Past Surgical History:  Procedure Laterality Date  . POLYPECTOMY     Colon  . TONSILLECTOMY    . TUBAL LIGATION      There were no vitals filed for this visit.  Subjective Assessment - 12/20/18 1104    Subjective  "It's pretty," re: tree outside    Patient is accompained by:  Family member   husband   Currently in Pain?  No/denies            ADULT SLP TREATMENT - 12/21/18 0001      General Information   Behavior/Cognition  Pleasant mood;Cooperative;Alert;Distractible;Requires cueing      Treatment Provided   Treatment provided  Cognitive-Linquistic      Pain Assessment   Pain Assessment  No/denies pain      Cognitive-Linquistic Treatment   Treatment focused on  Aphasia    Skilled Treatment  Husband reports he has begun setting up a book with family members, etc, but did not bring this today. SLP requested they bring to next session. They have not listened to any books; husband reports it has been many years since pt actually did much reading. Throughout session, SLP used whiteboard with written keywords and demo'd to patient  how she could reference earlier parts of the conversation by pointing to key words. Pt did this x2 in conversation re: discussion re: types of books pt enjoyed in the past. Husband stated he wanted to get a whiteboard to work with pt. SLP used [employer] with pt; she selected category of interest (entertainment) and followed along with 2 articles (written and auditory input). Afterwards answered comprehension questions 70% accuracy on first attempt, primarily due to impulsivity/not thoroughly reading/attending to multiple choice responses. When SLP read questions and choices aloud, accuracy was 100%. SLP then demo'd how pt/ husband could read article at home and then engage in conversation re: this topic using whiteboard with written keywords. Anticipate husband will need further education/demonstration/practice in order to be able to do this with pt at home.      Progression Toward Goals   Progression toward goals  Progressing toward goals       SLP Education - 12/21/18 0823    Education Details  supportive conversation techniques    Person(s) Educated  Patient;Spouse    Methods  Explanation;Demonstration    Comprehension  Verbalized understanding       SLP Short Term Goals - 12/21/18 0824      SLP SHORT TERM  GOAL #1   Title  pt will demo ability for functionalality with simple household langauge tasks (e.g., grocery list, errand list, to-do list) over 3 sessions    Time  2    Period  Weeks    Status  On-going      SLP SHORT TERM GOAL #2   Title  pt will achieve functional communication of wants/needs at home (reported, if needed) over two sessions    Time  2    Period  Weeks    Status  Revised      SLP SHORT TERM GOAL #3   Title  pt will demo correct right/left comprehension in simple functional commands 9/10 opportunities    Time  2    Period  Weeks    Status  On-going      SLP SHORT TERM GOAL #4   Title  pt will complete full aphasia eval before total visit #3     Baseline  form 1 completed 12/06/18    Status  Achieved      SLP SHORT TERM GOAL #5   Title  pt will complete cognitive linguistic eval PRN if deemed clinically necessary    Time  1    Period  Weeks    Status  Revised       SLP Long Term Goals - 12/21/18 0825      SLP LONG TERM GOAL #1   Title  pt will demonstrate improved verbal communication (as rated by pt) compared to when ST began    Time  6    Period  Weeks    Status  On-going      SLP LONG TERM GOAL #2   Title  pt will report speech and language is less frustrating than prior to initiation of ST    Time  6    Period  Weeks    Status  On-going      SLP LONG TERM GOAL #3   Title  pt will produce functional verbal expression in 3 minutes simple conversation over 3 sessions    Time  6    Period  Weeks    Status  On-going      SLP LONG TERM GOAL #4   Title  pt will use multimodal communication PRN to express wants/needs    Time  6    Period  Weeks    Status  On-going      SLP LONG TERM GOAL #5   Title  pt will functionally write her first name independently in 7/10 attempts over 3 sessions    Time  6    Period  Weeks    Status  On-going       Plan - 12/21/18 0824    Clinical Impression Statement  Pt contnues to present as an atypical TBI pt or CVA pt with mild-moderate (more moderate) anomic aphasia per Western Aphasia Battery-Revised. Possible cognitive testing to occur in subsequent sessions. See "skilled intervention" for details on today's session. Per husband, pt very reluctant/resistant to do any traditional ST work at home with husband. SLP recommends a course of skilled ST to address pt's decr'd language-cogniton ability/possible verbal apraxia.to incr. pt independence.    Speech Therapy Frequency  2x / week    Duration  --   8 weeks or 17 visits   Treatment/Interventions  SLP instruction and feedback;Compensatory strategies;Patient/family education;Multimodal communcation approach;Cueing hierarchy;Language  facilitation;Functional tasks;Oral motor exercises;Internal/external aids;Environmental controls;Cognitive reorganization    Potential to Achieve Goals  Fair  Potential Considerations  Severity of impairments;Other (comment)   time post onset of symptoms/peculiar presentation given suspected TBI (suspect language skills to have improved and not declined over time)   Consulted and Agree with Plan of Care  Patient       Patient will benefit from skilled therapeutic intervention in order to improve the following deficits and impairments:   Aphasia  Verbal apraxia  Cognitive communication deficit    Problem List Patient Active Problem List   Diagnosis Date Noted  . Fluency disorder associated with underlying disease 02/08/2018  . Cognitive and neurobehavioral dysfunction 02/08/2018  . 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 Arthritis of carpometacarpal [facility] joint of left thumb 01/24/2018  . Ankle fracture, left 09/03/2016  . Hand dysfunction 03/11/2016  . Symptomatic varicose veins, bilateral 03/11/2016  . Numbness of left thumb 03/11/2016  . Elevated BP 07/27/2014  . Bunion of left foot 07/26/2014  . Abnormality of gait 07/26/2014  . Cough 11/10/2013  . Post-nasal drainage 11/10/2013  . Abnormal urine odor 11/10/2013  . Visit for preventive health examination 07/28/2013  . Right anterior knee pain 07/22/2012  . Medicare annual wellness visit, subsequent 07/25/2011  . ANXIETY, SITUATIONAL 09/25/2009  . ARTHRITIS 09/25/2009  . Vitamin D deficiency 09/27/2008  . Hyperlipidemia 09/17/2008  . Elevated blood pressure reading without diagnosis of hypertension 09/17/2008  . COLONIC POLYPS, HX OF 09/17/2008  . PERSONAL HISTORY DISEASES SKIN&SUBCUT TISSUE 09/17/2008   Rondel BatonMary Beth Desere Gwin, MS, CCC-SLP Speech-Language Pathologist  Arlana LindauMary E Ardis Lawley 12/21/2018, 8:26 AM  [facility] [address] [place], [place],  [id number] Phone: [phone]   Fax:  [phone]   Name: Pamela ApleySandra Alvarez MRN: [id number] Date of Birth: 04/07/1940

## 2018-12-23 ENCOUNTER — Ambulatory Visit: Payer: Medicare Other | Admitting: Occupational Therapy

## 2018-12-23 ENCOUNTER — Encounter: Payer: Self-pay | Admitting: Occupational Therapy

## 2018-12-23 ENCOUNTER — Encounter: Payer: Medicare Other | Admitting: Occupational Therapy

## 2018-12-23 ENCOUNTER — Other Ambulatory Visit: Payer: Self-pay

## 2018-12-23 ENCOUNTER — Ambulatory Visit (INDEPENDENT_AMBULATORY_CARE_PROVIDER_SITE_OTHER): Payer: Medicare Other | Admitting: Internal Medicine

## 2018-12-23 ENCOUNTER — Ambulatory Visit: Payer: Medicare Other | Admitting: Physical Therapy

## 2018-12-23 ENCOUNTER — Ambulatory Visit: Payer: Medicare Other

## 2018-12-23 ENCOUNTER — Encounter: Payer: Self-pay | Admitting: Internal Medicine

## 2018-12-23 ENCOUNTER — Encounter: Payer: Self-pay | Admitting: Physical Therapy

## 2018-12-23 VITALS — BP 130/80 | HR 86 | Temp 97.6°F | Ht 61.5 in | Wt 148.6 lb

## 2018-12-23 DIAGNOSIS — E538 Deficiency of other specified B group vitamins: Secondary | ICD-10-CM

## 2018-12-23 DIAGNOSIS — R41844 Frontal lobe and executive function deficit: Secondary | ICD-10-CM

## 2018-12-23 DIAGNOSIS — Z0000 Encounter for general adult medical examination without abnormal findings: Secondary | ICD-10-CM | POA: Diagnosis not present

## 2018-12-23 DIAGNOSIS — R41840 Attention and concentration deficit: Secondary | ICD-10-CM

## 2018-12-23 DIAGNOSIS — R2681 Unsteadiness on feet: Secondary | ICD-10-CM | POA: Diagnosis not present

## 2018-12-23 DIAGNOSIS — R278 Other lack of coordination: Secondary | ICD-10-CM

## 2018-12-23 DIAGNOSIS — E785 Hyperlipidemia, unspecified: Secondary | ICD-10-CM | POA: Diagnosis not present

## 2018-12-23 DIAGNOSIS — Z79899 Other long term (current) drug therapy: Secondary | ICD-10-CM | POA: Diagnosis not present

## 2018-12-23 DIAGNOSIS — I10 Essential (primary) hypertension: Secondary | ICD-10-CM

## 2018-12-23 DIAGNOSIS — R2689 Other abnormalities of gait and mobility: Secondary | ICD-10-CM

## 2018-12-23 DIAGNOSIS — R41841 Cognitive communication deficit: Secondary | ICD-10-CM

## 2018-12-23 DIAGNOSIS — M6281 Muscle weakness (generalized): Secondary | ICD-10-CM

## 2018-12-23 DIAGNOSIS — E559 Vitamin D deficiency, unspecified: Secondary | ICD-10-CM

## 2018-12-23 DIAGNOSIS — R482 Apraxia: Secondary | ICD-10-CM

## 2018-12-23 DIAGNOSIS — E2839 Other primary ovarian failure: Secondary | ICD-10-CM

## 2018-12-23 DIAGNOSIS — Z23 Encounter for immunization: Secondary | ICD-10-CM | POA: Diagnosis not present

## 2018-12-23 DIAGNOSIS — R4701 Aphasia: Secondary | ICD-10-CM

## 2018-12-23 DIAGNOSIS — R739 Hyperglycemia, unspecified: Secondary | ICD-10-CM

## 2018-12-23 DIAGNOSIS — R41842 Visuospatial deficit: Secondary | ICD-10-CM

## 2018-12-23 LAB — LIPID PANEL
Cholesterol: 254 mg/dL — ABNORMAL HIGH (ref 0–200)
HDL: 68.9 mg/dL (ref >39.00)
LDL Cholesterol: 171 mg/dL — ABNORMAL HIGH (ref 0–99)
NonHDL: 185.13
Total CHOL/HDL Ratio: 4
Triglycerides: 73 mg/dL (ref 0.0–149.0)
VLDL: 14.6 mg/dL (ref 0.0–40.0)

## 2018-12-23 LAB — CBC WITH DIFFERENTIAL/PLATELET
Basophils Absolute: 0 10*3/uL (ref 0.0–0.1)
Basophils Relative: 0.5 % (ref 0.0–3.0)
Eosinophils Absolute: 0.2 10*3/uL (ref 0.0–0.7)
Eosinophils Relative: 3 % (ref 0.0–5.0)
HCT: 44.3 % (ref 36.0–46.0)
Hemoglobin: 14.8 g/dL (ref 12.0–15.0)
Lymphocytes Relative: 26.7 % (ref 12.0–46.0)
Lymphs Abs: 2 10*3/uL (ref 0.7–4.0)
MCHC: 33.3 g/dL (ref 30.0–36.0)
MCV: 91.6 fl (ref 78.0–100.0)
Monocytes Absolute: 0.6 10*3/uL (ref 0.1–1.0)
Monocytes Relative: 7.8 % (ref 3.0–12.0)
Neutro Abs: 4.7 10*3/uL (ref 1.4–7.7)
Neutrophils Relative %: 62 % (ref 43.0–77.0)
Platelets: 284 10*3/uL (ref 150.0–400.0)
RBC: 4.83 Mil/uL (ref 3.87–5.11)
RDW: 14.5 % (ref 11.5–15.5)
WBC: 7.6 10*3/uL (ref 4.0–10.5)

## 2018-12-23 LAB — HEPATIC FUNCTION PANEL
ALT: 16 U/L (ref 0–35)
AST: 19 U/L (ref 0–37)
Albumin: 4.1 g/dL (ref 3.5–5.2)
Alkaline Phosphatase: 79 U/L (ref 39–117)
Bilirubin, Direct: 0.1 mg/dL (ref 0.0–0.3)
Total Bilirubin: 0.5 mg/dL (ref 0.2–1.2)
Total Protein: 7 g/dL (ref 6.0–8.3)

## 2018-12-23 LAB — BASIC METABOLIC PANEL
BUN: 15 mg/dL (ref 6–23)
CO2: 24 mEq/L (ref 19–32)
Calcium: 9.5 mg/dL (ref 8.4–10.5)
Chloride: 107 mEq/L (ref 96–112)
Creatinine, Ser: 0.98 mg/dL (ref 0.40–1.20)
GFR: 54.74 mL/min — ABNORMAL LOW (ref >60.00)
Glucose, Bld: 93 mg/dL (ref 70–99)
Potassium: 4.4 mEq/L (ref 3.5–5.1)
Sodium: 141 mEq/L (ref 135–145)

## 2018-12-23 LAB — TSH: TSH: 2.57 u[IU]/mL (ref 0.35–4.50)

## 2018-12-23 LAB — HEMOGLOBIN A1C: Hgb A1c MFr Bld: 5.7 % (ref 4.6–6.5)

## 2018-12-23 LAB — VITAMIN B12: Vitamin B-12: 1421 pg/mL — ABNORMAL HIGH (ref 211–911)

## 2018-12-23 LAB — VITAMIN D 25 HYDROXY (VIT D DEFICIENCY, FRACTURES): VITD: 15.1 ng/mL — ABNORMAL LOW (ref 30.00–100.00)

## 2018-12-23 NOTE — Patient Instructions (Signed)
Will notify you  of labs when available.   Bone density  If needed .    Health Maintenance, Female Adopting a healthy lifestyle and getting preventive care are important in promoting health and wellness. Ask your health care provider about:  The right schedule for you to have regular tests and exams.  Things you can do on your own to prevent diseases and keep yourself healthy. What should I know about diet, weight, and exercise? Eat a healthy diet   Eat a diet that includes plenty of vegetables, fruits, low-fat dairy products, and lean protein.  Do not eat a lot of foods that are high in solid fats, added sugars, or sodium. Maintain a healthy weight Body mass index (BMI) is used to identify weight problems. It estimates body fat based on height and weight. Your health care provider can help determine your BMI and help you achieve or maintain a healthy weight. Get regular exercise Get regular exercise. This is one of the most important things you can do for your health. Most adults should:  Exercise for at least 150 minutes each week. The exercise should increase your heart rate and make you sweat (moderate-intensity exercise).  Do strengthening exercises at least twice a week. This is in addition to the moderate-intensity exercise.  Spend less time sitting. Even light physical activity can be beneficial. Watch cholesterol and blood lipids Have your blood tested for lipids and cholesterol at 79 years of age, then have this test every 5 years. Have your cholesterol levels checked more often if:  Your lipid or cholesterol levels are high.  You are older than 79 years of age.  You are at high risk for heart disease. What should I know about cancer screening? Depending on your health history and family history, you may need to have cancer screening at various ages. This may include screening for:  Breast cancer.  Cervical cancer.  Colorectal cancer.  Skin cancer.  Lung  cancer. What should I know about heart disease, diabetes, and high blood pressure? Blood pressure and heart disease  High blood pressure causes heart disease and increases the risk of stroke. This is more likely to develop in people who have high blood pressure readings, are of African descent, or are overweight.  Have your blood pressure checked: ? Every 3-5 years if you are 42-94 years of age. ? Every year if you are 69 years old or older. Diabetes Have regular diabetes screenings. This checks your fasting blood sugar level. Have the screening done:  Once every three years after age 25 if you are at a normal weight and have a low risk for diabetes.  More often and at a younger age if you are overweight or have a high risk for diabetes. What should I know about preventing infection? Hepatitis B If you have a higher risk for hepatitis B, you should be screened for this virus. Talk with your health care provider to find out if you are at risk for hepatitis B infection. Hepatitis C Testing is recommended for:  Everyone born from [birth year] through 1966.  Anyone with known risk factors for hepatitis C. Sexually transmitted infections (STIs)  Get screened for STIs, including gonorrhea and chlamydia, if: ? You are sexually active and are younger than 79 years of age. ? You are older than 79 years of age and your health care provider tells you that you are at risk for this type of infection. ? Your sexual activity has changed since you  were last screened, and you are at increased risk for chlamydia or gonorrhea. Ask your health care provider if you are at risk.  Ask your health care provider about whether you are at high risk for HIV. Your health care provider may recommend a prescription medicine to help prevent HIV infection. If you choose to take medicine to prevent HIV, you should first get tested for HIV. You should then be tested every 3 months for as long as you are taking the medicine.  Pregnancy  If you are about to stop having your period (premenopausal) and you may become pregnant, seek counseling before you get pregnant.  Take 400 to 800 micrograms (mcg) of folic acid every day if you become pregnant.  Ask for birth control (contraception) if you want to prevent pregnancy. Osteoporosis and menopause Osteoporosis is a disease in which the bones lose minerals and strength with aging. This can result in bone fractures. If you are 43 years old or older, or if you are at risk for osteoporosis and fractures, ask your health care provider if you should:  Be screened for bone loss.  Take a calcium or vitamin D supplement to lower your risk of fractures.  Be given hormone replacement therapy (HRT) to treat symptoms of menopause. Follow these instructions at home: Lifestyle  Do not use any products that contain nicotine or tobacco, such as cigarettes, e-cigarettes, and chewing tobacco. If you need help quitting, ask your health care provider.  Do not use street drugs.  Do not share needles.  Ask your health care provider for help if you need support or information about quitting drugs. Alcohol use  Do not drink alcohol if: ? Your health care provider tells you not to drink. ? You are pregnant, may be pregnant, or are planning to become pregnant.  If you drink alcohol: ? Limit how much you use to 0-1 drink a day. ? Limit intake if you are breastfeeding.  Be aware of how much alcohol is in your drink. In the [place] one drink equals one 12 oz bottle of beer (355 mL), one 5 oz glass of wine (148 mL), or one 1 oz glass of hard liquor (44 mL). General instructions  Schedule regular health, dental, and eye exams.  Stay current with your vaccines.  Tell your health care provider if: ? You often feel depressed. ? You have ever been abused or do not feel safe at home. Summary  Adopting a healthy lifestyle and getting preventive care are important in promoting health  and wellness.  Follow your health care provider's instructions about healthy diet, exercising, and getting tested or screened for diseases.  Follow your health care provider's instructions on monitoring your cholesterol and blood pressure. This information is not intended to replace advice given to you by your health care provider. Make sure you discuss any questions you have with your health care provider. Document Released: 09/08/2010 Document Revised: 02/16/2018 Document Reviewed: 02/16/2018 Elsevier Patient Education  2020 [employer].

## 2018-12-23 NOTE — Patient Instructions (Addendum)
WALKING  Walking is a great form of exercise to increase your strength, endurance and overall fitness.  A walking program can help you start slowly and gradually build endurance as you go.  Everyone's ability is different, so each person's starting point will be different.  You do not have to follow them exactly.  The are just samples. You should simply find out what's right for you and stick to that program.   In the beginning, you'll start off walking 2-3 times a day for short distances.  As you get stronger, you'll be walking further at just 1-2 times per day.  A. You Can Walk For A Certain Length Of Time Each Day    Walk 3-4 minutes 3 times per day.  Increase 1 minute every 3-5 days (3 times per day).  Work up to 10-12 minutes (1-2 times per day).   Example:   Day 1-2 3-4 minutes 3 times per day   Day 7-8 6-8 minutes 2-3 times per day   Day 13-14 10-12 minutes 1-2 times per day  B. You Can Walk For a Certain Distance Each Day     Distance can be substituted for time.    Example:   3 laps around the inside of your house Fall Prevention in the Home, Adult Falls can cause injuries. They can happen to people of all ages. There are many things you can do to make your home safe and to help prevent falls. Ask for help when making these changes, if needed. What actions can I take to prevent falls? General Instructions  Use good lighting in all rooms. Replace any light bulbs that burn out.  Turn on the lights when you go into a dark area. Use night-lights.  Keep items that you use often in easy-to-reach places. Lower the shelves around your home if necessary.  Set up your furniture so you have a clear path. Avoid moving your furniture around.  Do not have throw rugs and other things on the floor that can make you trip.  Avoid walking on wet floors.  If any of your floors are uneven, fix them.  Add color or contrast paint or tape to clearly mark and help you see: ? Any grab bars  or handrails. ? First and last steps of stairways. ? Where the edge of each step is.  If you use a stepladder: ? Make sure that it is fully opened. Do not climb a closed stepladder. ? Make sure that both sides of the stepladder are locked into place. ? Ask someone to hold the stepladder for you while you use it.  If there are any pets around you, be aware of where they are. What can I do in the bathroom?      Keep the floor dry. Clean up any water that spills onto the floor as soon as it happens.  Remove soap buildup in the tub or shower regularly.  Use non-skid mats or decals on the floor of the tub or shower.  Attach bath mats securely with double-sided, non-slip rug tape.  If you need to sit down in the shower, use a plastic, non-slip stool.  Install grab bars by the toilet and in the tub and shower. Do not use towel bars as grab bars. What can I do in the bedroom?  Make sure that you have a light by your bed that is easy to reach.  Do not use any sheets or blankets that are too big for your  bed. They should not hang down onto the floor.  Have a firm chair that has side arms. You can use this for support while you get dressed. What can I do in the kitchen?  Clean up any spills right away.  If you need to reach something above you, use a strong step stool that has a grab bar.  Keep electrical cords out of the way.  Do not use floor polish or wax that makes floors slippery. If you must use wax, use non-skid floor wax. What can I do with my stairs?  Do not leave any items on the stairs.  Make sure that you have a light switch at the top of the stairs and the bottom of the stairs. If you do not have them, ask someone to add them for you.  Make sure that there are handrails on both sides of the stairs, and use them. Fix handrails that are broken or loose. Make sure that handrails are as long as the stairways.  Install non-slip stair treads on all stairs in your  home.  Avoid having throw rugs at the top or bottom of the stairs. If you do have throw rugs, attach them to the floor with carpet tape.  Choose a carpet that does not hide the edge of the steps on the stairway.  Check any carpeting to make sure that it is firmly attached to the stairs. Fix any carpet that is loose or worn. What can I do on the outside of my home?  Use bright outdoor lighting.  Regularly fix the edges of walkways and driveways and fix any cracks.  Remove anything that might make you trip as you walk through a door, such as a raised step or threshold.  Trim any bushes or trees on the path to your home.  Regularly check to see if handrails are loose or broken. Make sure that both sides of any steps have handrails.  Install guardrails along the edges of any raised decks and porches.  Clear walking paths of anything that might make someone trip, such as tools or rocks.  Have any leaves, snow, or ice cleared regularly.  Use sand or salt on walking paths during winter.  Clean up any spills in your garage right away. This includes grease or oil spills. What other actions can I take?  Wear shoes that: ? Have a low heel. Do not wear high heels. ? Have rubber bottoms. ? Are comfortable and fit you well. ? Are closed at the toe. Do not wear open-toe sandals.  Use tools that help you move around (mobility aids) if they are needed. These include: ? Canes. ? Walkers. ? Scooters. ? Crutches.  Review your medicines with your doctor. Some medicines can make you feel dizzy. This can increase your chance of falling. Ask your doctor what other things you can do to help prevent falls. Where to find more information  Centers for Disease Control and Prevention, STEADI: HealthcareCounselor.com.pt  [employer] on Aging: RingConnections.si Contact a doctor if:  You are afraid of falling at home.  You feel weak, drowsy, or dizzy at home.  You fall at  home. Summary  There are many simple things that you can do to make your home safe and to help prevent falls.  Ways to make your home safe include removing tripping hazards and installing grab bars in the bathroom.  Ask for help when making these changes in your home. This information is not intended to replace  advice given to you by your health care provider. Make sure you discuss any questions you have with your health care provider. Document Released: 12/20/2008 Document Revised: 06/16/2018 Document Reviewed: 10/08/2016 Elsevier Patient Education  2020 [employer]

## 2018-12-23 NOTE — Therapy (Signed)
[facility] [address] [place], [place], [address] Phone: [phone]   Fax:  [phone]  Speech Language Pathology Treatment  Patient Details  Name: Pamela Alvarez MRN: [id number] Date of Birth: 1940/09/18 Referring Provider (SLP): Dohmeier, Porfirio Mylar, MD   Encounter Date: 12/23/2018  End of Session - 12/23/18 1310    Visit Number  7    Number of Visits  17    Date for SLP Re-Evaluation  02/20/19    SLP Start Time  1148    SLP Stop Time   1230    SLP Time Calculation (min)  42 min    Activity Tolerance  Patient tolerated treatment well       Past Medical History:  Diagnosis Date  . Cyst 07-2008   Cyst on back x2 that were drained  . H/O echocardiogram 02-2000  . Hx of colonic polyps   . Hyperlipidemia   . Hypertension   . Varicose veins of both lower extremities     Past Surgical History:  Procedure Laterality Date  . POLYPECTOMY     Colon  . TONSILLECTOMY    . TUBAL LIGATION      There were no vitals filed for this visit.  Subjective Assessment - 12/23/18 1150    Subjective  "Pamela Alvarez, over the last few days, has put together a couple sentences."    Patient is accompained by:  Family member   husband           ADULT SLP TREATMENT - 12/23/18 1152      General Information   Behavior/Cognition  Pleasant mood;Cooperative;Alert;Distractible;Requires cueing      Treatment Provided   Treatment provided  Cognitive-Linquistic      Pain Assessment   Pain Assessment  No/denies pain      Cognitive-Linquistic Treatment   Treatment focused on  Aphasia    Skilled Treatment  Book still in process. Husband stated it would be ready next week. Husband brought tablet with [employer] downloaded for guidance from SLP on how best to operate. SLP demonstrated to husband how to cue pt for ocpmletion of questions. SLP told husband that it is goal for pt to read the questions and answers herself but may need  cues/assistance from him. Simple conversation about which article pt liked most req'd rare min A from SLP for clarification. Husband stated this session was helpful for him to learn more how to do articles with pt.      Assessment / Recommendations / Plan   Plan  Continue with current plan of care      Progression Toward Goals   Progression toward goals  Progressing toward goals       SLP Education - 12/23/18 1309    Education Details  how to cue pt with articles    Person(s) Educated  Patient;Spouse    Methods  Explanation;Demonstration    Comprehension  Verbalized understanding       SLP Short Term Goals - 12/23/18 1311      SLP SHORT TERM GOAL #1   Title  pt will demo ability for functionalality with simple household langauge tasks (e.g., grocery list, errand list, to-do list) over 3 sessions    Time  2    Period  Weeks    Status  On-going      SLP SHORT TERM GOAL #2   Title  pt will achieve functional communication of wants/needs at home (reported, if needed) over two sessions    Time  2    Period  Weeks    Status  Revised      SLP SHORT TERM GOAL #3   Title  pt will demo correct right/left comprehension in simple functional commands 9/10 opportunities    Time  2    Period  Weeks    Status  On-going      SLP SHORT TERM GOAL #4   Title  pt will complete full aphasia eval before total visit #3    Baseline  form 1 completed 12/06/18    Status  Achieved      SLP SHORT TERM GOAL #5   Title  pt will complete cognitive linguistic eval PRN if deemed clinically necessary    Time  1    Period  Weeks    Status  Revised       SLP Long Term Goals - 12/23/18 1311      SLP LONG TERM GOAL #1   Title  pt will demonstrate improved verbal communication (as rated by pt) compared to when ST began    Time  6    Period  Weeks    Status  On-going      SLP LONG TERM GOAL #2   Title  pt will report speech and language is less frustrating than prior to initiation of ST    Time  6     Period  Weeks    Status  On-going      SLP LONG TERM GOAL #3   Title  pt will produce functional verbal expression in 3 minutes simple conversation over 3 sessions    Time  6    Period  Weeks    Status  On-going      SLP LONG TERM GOAL #4   Title  pt will use multimodal communication PRN to express wants/needs    Time  6    Period  Weeks    Status  On-going      SLP LONG TERM GOAL #5   Title  pt will functionally write her first name independently in 7/10 attempts over 3 sessions    Time  6    Period  Weeks    Status  On-going       Plan - 12/23/18 1310    Clinical Impression Statement  Pt contnues to present as an atypical TBI pt or CVA pt with mild-moderate (more moderate) anomic aphasia per Western Aphasia Battery-Revised. Possible cognitive testing to occur in subsequent sessions. See "skilled intervention" for details on today's session. Per husband, pt worked with him doing News articles via [employer]. SLP recommends cont'd course of skilled ST to address pt's decr'd language-cogniton ability/possible verbal apraxia.to incr. pt independence.    Speech Therapy Frequency  2x / week    Duration  --   8 weeks or 17 visits   Treatment/Interventions  SLP instruction and feedback;Compensatory strategies;Patient/family education;Multimodal communcation approach;Cueing hierarchy;Language facilitation;Functional tasks;Oral motor exercises;Internal/external aids;Environmental controls;Cognitive reorganization    Potential to Achieve Goals  Fair    Potential Considerations  Severity of impairments;Other (comment)   time post onset of symptoms/peculiar presentation given suspected TBI (suspect language skills to have improved and not declined over time)   Consulted and Agree with Plan of Care  Patient       Patient will benefit from skilled therapeutic intervention in order to improve the following deficits and impairments:   Aphasia  Verbal apraxia  Cognitive communication  deficit    Problem List Patient  Active Problem List   Diagnosis Date Noted  . Fluency disorder associated with underlying disease 02/08/2018  . Cognitive and neurobehavioral dysfunction 02/08/2018  . 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 Arthritis of carpometacarpal [facility] joint of left thumb 01/24/2018  . Ankle fracture, left 09/03/2016  . Hand dysfunction 03/11/2016  . Symptomatic varicose veins, bilateral 03/11/2016  . Numbness of left thumb 03/11/2016  . Elevated BP 07/27/2014  . Bunion of left foot 07/26/2014  . Abnormality of gait 07/26/2014  . Cough 11/10/2013  . Post-nasal drainage 11/10/2013  . Abnormal urine odor 11/10/2013  . Visit for preventive health examination 07/28/2013  . Right anterior knee pain 07/22/2012  . Medicare annual wellness visit, subsequent 07/25/2011  . ANXIETY, SITUATIONAL 09/25/2009  . ARTHRITIS 09/25/2009  . Vitamin D deficiency 09/27/2008  . Hyperlipidemia 09/17/2008  . Elevated blood pressure reading without diagnosis of hypertension 09/17/2008  . COLONIC POLYPS, HX OF 09/17/2008  . PERSONAL HISTORY DISEASES SKIN&SUBCUT TISSUE 09/17/2008    Peace[facility] Southwest Medical CenterCHINKE,Jash Wahlen ,MS, CCC-SLP  12/23/2018, 1:12 PM  [facility] [address] [place], [place], [address] Phone: [phone]   Fax:  [phone]   Name: Lorie ApleySandra Commins MRN: [id number] Date of Birth: 01/05/1941

## 2018-12-23 NOTE — Therapy (Signed)
[facility] [address] [place], [place], [address] Phone: [phone]   Fax:  [phone]  Occupational Therapy Treatment  Patient Details  Name: Pamela ApleySandra Alvarez MRN: [id number] Date of Birth: 07/20/1940 No data recorded  Encounter Date: 12/23/2018  OT End of Session - 12/23/18 1235    Visit Number  5    Number of Visits  13    Date for OT Re-Evaluation  01/06/19    Authorization Type  [employer] Medicare, no auth    Authorization - Visit Number  5    Authorization - Number of Visits  10    OT Start Time  1234    OT Stop Time  1313    OT Time Calculation (min)  39 min    Activity Tolerance  Patient tolerated treatment well    Behavior During Therapy  Restless;Impulsive       Past Medical History:  Diagnosis Date  . Cyst 07-2008   Cyst on back x2 that were drained  . H/O echocardiogram 02-2000  . Hx of colonic polyps   . Hyperlipidemia   . Hypertension   . Varicose veins of both lower extremities     Past Surgical History:  Procedure Laterality Date  . POLYPECTOMY     Colon  . TONSILLECTOMY    . TUBAL LIGATION      There were no vitals filed for this visit.  Subjective Assessment - 12/23/18 1234    Patient is accompanied by:  Family member    Pertinent History  78 y.o female with recent declining cognitive abilities, per MRI -right frontal, right parietal and right cerebellar encephalomalacia and gliosis, possible embolic chronic ischemic infarcts.Pt has hx of ankle fx, hyperlipidemia, and thumb CMC arthritis. Per pt's husband MD is still trying to determine a definative diagnosis, per chart, possible PSP vs. TBI.    Limitations  fall risk    Patient Stated Goals  improved use of L hand, to be as independent as possible    Currently in Pain?  No/denies            Treatment: Reviewed cane exercises, sh. Flexion, chest press and shoulder abduction with pt/ husband min-mod v.c/ facilitation. Arm bike 2  sets of 2 mins with rest in between for condition. Standing to place large pegs in vertical pegboard with LUE, minguard and min-mod  v.c for perfromance Writing activities, min v.c, improving ability to trace her name with good control.                  OT Short Term Goals - 11/22/18 1422      OT SHORT TERM GOAL #1   Title  Pt/ caregiver will be I with HEP.    Time  4    Period  Weeks    Status  New    Target Date  12/22/18      OT SHORT TERM GOAL #2   Title  Pt/ husband will verbalize understanding of adapted strategies to maximize safety and independence with ADLs/ IADLs.    Time  4    Period  Weeks    Status  New      OT SHORT TERM GOAL #3   Title  Pt will demonstrate ability to retrieve a lightweight object at 100 shoulder flexion with LUE.    Time  4    Period  Weeks    Status  New      OT SHORT TERM GOAL #4  Title  Pt will demonstrate ability to retrieve a lightweight object at 115 shoulder flexion with RUE.    Time  4    Period  Weeks    Status  New        OT Long Term Goals - 11/22/18 1424      OT LONG TERM GOAL #1   Title  Pt will increase LUE funtional use and reach as eveidenced by increasing LUE box/ blocks to 15 blocks.    Time  8    Period  Weeks    Status  New      OT LONG TERM GOAL #2   Title  Pt will increase LUE grip strength to 25 lbs or greater for increased ease with ADLS.    Time  8    Period  Weeks    Status  New      OT LONG TERM GOAL #3   Title  Pt will demonstrate ability to write her name legibly with good letter size x 3  trials.    Time  8    Period  Weeks    Status  New      OT LONG TERM GOAL #4   Title  Pt will demonstrate ability to retrieve a lightweight object aty 105 shoulder flexion with LUE.    Time  8    Period  Weeks    Status  New            Plan - 12/23/18 1557    Clinical Impression Statement  Pt is progressing well with improving LUE functional use.    OT Occupational Profile and History   Detailed Assessment- Review of Records and additional review of physical, cognitive, psychosocial history related to current functional performance    Occupational performance deficits (Please refer to evaluation for details):  ADL's;IADL's;Leisure;Work;Social Participation    Body Structure / Function / Physical Skills  ADL;UE functional use;Balance;Flexibility;Vision;FMC;ROM;Gait;Coordination;GMC;Sensation;Decreased knowledge of precautions;Decreased knowledge of use of DME;IADL;Strength;Dexterity;Mobility    Cognitive Skills  Attention;Learn;Memory;Problem Solve;Safety Awareness;Sequencing;Thought;Understand    Rehab Potential  Fair    Clinical Decision Making  Several treatment options, min-mod task modification necessary   extra cueing due to aphasia, demonstration   Comorbidities Affecting Occupational Performance:  May have comorbidities impacting occupational performance    Modification or Assistance to Complete Evaluation   Min-Moderate modification of tasks or assist with assess necessary to complete eval   increased time and instruction   OT Frequency  2x / week   aticipate d/c after 4-6 weeks dependent upon pt progress.   OT Duration  8 weeks    OT Treatment/Interventions  Self-care/ADL training;Energy conservation;Visual/perceptual remediation/compensation;Patient/family education;DME and/or AE instruction;Aquatic Therapy;Paraffin;Passive range of motion;Balance training;Fluidtherapy;Cryotherapy;Building services engineer;Therapeutic activities;Manual Therapy;Therapeutic exercise;Moist Heat;Neuromuscular education;Cognitive remediation/compensation    Plan  strategies for ADLs, LUE functional use (forced use), ?standing with reaching/simple standing IADL with supervision    Consulted and Agree with Plan of Care  Patient;Family member/caregiver    Family Member Consulted  husband       Patient will benefit from skilled therapeutic intervention in order to improve the following  deficits and impairments:   Body Structure / Function / Physical Skills: ADL, UE functional use, Balance, Flexibility, Vision, FMC, ROM, Gait, Coordination, GMC, Sensation, Decreased knowledge of precautions, Decreased knowledge of use of DME, IADL, Strength, Dexterity, Mobility Cognitive Skills: Attention, Learn, Memory, Problem Solve, Safety Awareness, Sequencing, Thought, Understand     Visit Diagnosis: Frontal lobe and executive function deficit  Attention and concentration  deficit  Visuospatial deficit  Other lack of coordination  Muscle weakness (generalized)    Problem List Patient Active Problem List   Diagnosis Date Noted  . Fluency disorder associated with underlying disease 02/08/2018  . Cognitive and neurobehavioral dysfunction 02/08/2018  . 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 Arthritis of carpometacarpal [facility]) joint of left thumb 01/24/2018  . Ankle fracture, left 09/03/2016  . Hand dysfunction 03/11/2016  . Symptomatic varicose veins, bilateral 03/11/2016  . Numbness of left thumb 03/11/2016  . Elevated BP 07/27/2014  . Bunion of left foot 07/26/2014  . Abnormality of gait 07/26/2014  . Cough 11/10/2013  . Post-nasal drainage 11/10/2013  . Abnormal urine odor 11/10/2013  . Visit for preventive health examination 07/28/2013  . Right anterior knee pain 07/22/2012  . Medicare annual wellness visit, subsequent 07/25/2011  . ANXIETY, SITUATIONAL 09/25/2009  . ARTHRITIS 09/25/2009  . Vitamin D deficiency 09/27/2008  . Hyperlipidemia 09/17/2008  . Elevated blood pressure reading without diagnosis of hypertension 09/17/2008  . COLONIC POLYPS, HX OF 09/17/2008  . PERSONAL HISTORY DISEASES SKIN&SUBCUT TISSUE 09/17/2008    Taija Mathias 12/23/2018, 3:57 PM  [place] [address] [place], [place], [address] Phone: [phone]   Fax:  [phone]  Name: Pamela Alvarez MRN: [id number] Date of  Birth: 06/20/1940

## 2018-12-24 NOTE — Therapy (Signed)
[facility] [address] [place], [place], [address] Phone: [phone]   Fax:  [phone]  Physical Therapy Treatment  Patient Details  Name: Pamela ApleySandra Alvarez MRN: [id number] Date of Birth: 10/13/1940 Referring Provider (PT): Dohmeier, Porfirio Mylararmen   Encounter Date: 12/23/2018  PT End of Session - 12/24/18 1748    Visit Number  7    Number of Visits  13    Date for PT Re-Evaluation  01/21/19    Authorization Type  [employer] Medicare-will need 10th visit progress note    PT Start Time  1104    PT Stop Time  1144    PT Time Calculation (min)  40 min    Equipment Utilized During Treatment  Gait belt    Activity Tolerance  Patient tolerated treatment well    Behavior During Therapy  Impulsive   Impulsive with transfers      Past Medical History:  Diagnosis Date  . Cyst 07-2008   Cyst on back x2 that were drained  . H/O echocardiogram 02-2000  . Hx of colonic polyps   . Hyperlipidemia   . Hypertension   . Varicose veins of both lower extremities     Past Surgical History:  Procedure Laterality Date  . POLYPECTOMY     Colon  . TONSILLECTOMY    . TUBAL LIGATION      There were no vitals filed for this visit.  Subjective Assessment - 12/23/18 1105    Subjective  Went out to eat last night.  No falls, no changes.    Patient is accompained by:  Family member   Husband   Patient Stated Goals  Pt's goal for therapy is better balance and walking.    Currently in Pain?  No/denies                       [facility] Adult PT Treatment/Exercise - 12/24/18 1741      Transfers   Transfers  Sit to Stand;Stand to Sit    Sit to Stand  5: Supervision;4: Min guard;With upper extremity assist;From bed    Sit to Stand Details  Tactile cues for weight beaing;Visual cues/gestures for precautions/safety;Verbal cues for technique;Tactile cues for weight shifting    Stand to Sit  5: Supervision;With upper extremity assist;To  bed;To chair/3-in-1    Stand to Sit Details (indicate cue type and reason)  Tactile cues for sequencing;Tactile cues for placement;Verbal cues for sequencing;Verbal cues for technique      Ambulation/Gait   Ambulation/Gait  Yes    Ambulation/Gait Assistance  4: Min guard    Ambulation/Gait Assistance Details  Cues provided for pt to stay close to rollator (she is able to pull walker back to her and correct with cues)    Ambulation Distance (Feet)  345 Feet   in 3 minutes of walking   Assistive device  Rollator    Gait Pattern  Step-to pattern;Step-through pattern;Decreased step length - right;Decreased step length - left;Shuffle;Decreased dorsiflexion - right;Decreased dorsiflexion - left    Ambulation Surface  Level;Indoor;Outdoor;Paved    Ramp  4: Min assist    Ramp Details (indicate cue type and reason)  Using rollator on indoor ramp, PT provides assistance with brakes and helping to slow descent of rollator on decline; cues to stay close to rollator    Gait Comments  Additional gait training outdoors on sidewalks with rollator, x 200 ft with min guard (min assist for inclines/declines).  Provided VCs to patient (  and to husband upon return into clinic from outside) regarding supervision, assistance needed for braking and staying close to rollator with inclines/declines.  Discussed walking program as addition to HEP for home using rollator and with husband supervision.      High Level Balance   High Level Balance Activities  Other (comment)    High Level Balance Comments  step taps to 6", 12" step with bilateral>1 UE support, x 10 reps each.  Progressed to counter for balance with posterior step and weightshift x 10 reps.  Standing with head turns with bilat UE support x 5 reps      Self-Care   Self-Care  Other Self-Care Comments    Other Self-Care Comments   Provided fall prevention education, including home safety (use of rollator, need for close supervision with transfersa nd gait) -see  instructions      At 6" step:  Forward step up/up, down/down, x 5 reps each leg leading, with tactile cues for correct leg placement, to address SLS        PT Education - 12/24/18 1747    Education Details  Walking program, fall prevention education provided    Person(s) Educated  Patient;Spouse    Methods  Explanation;Demonstration;Handout    Comprehension  Verbalized understanding;Returned demonstration;Verbal cues required       PT Short Term Goals - 12/21/18 1421      PT SHORT TERM GOAL #1   Title  Pt will perform HEP with family supervision for improved transfers, balance, and gait.  UPDATED TARGET 12/30/2018 ( due to later start with later scheduling)    Time  4    Period  Weeks    Status  On-going      PT SHORT TERM GOAL #2   Title  Pt will perform 4 of 5 reps of sit<>stand transfers with correct technique, with husband supervision and cues, for improved safety with transfers.    Time  4    Period  Weeks    Status  On-going      PT SHORT TERM GOAL #3   Title  Pt will improve Berg Balance score to at least 24/56 for decreased fall risk.    Time  4    Period  Weeks    Status  On-going      PT SHORT TERM GOAL #4   Title  Pt will improve TUG score to less than or equal to 23 seconds for decreased fall risk.    Time  4    Period  Weeks    Status  On-going      PT SHORT TERM GOAL #5   Title  Pt/husband will verbalize understanding of fall prevention in home environment.    Time  4    Period  Weeks    Status  On-going        PT Long Term Goals - 12/21/18 1422      PT LONG TERM GOAL #1   Title  Pt will perform updated HEP for balance, gait, with husband's supervision.  UPDATED TARGET 111/08/2018 (delayed due to delayed scheduling)    Time  6    Period  Weeks    Status  On-going      PT LONG TERM GOAL #2   Title  Pt will improve gait velocity to at  least 1.5 ft/sec with appropriate device and supervision, for improved gait efficiency.    Time  6    Period   Weeks    Status  On-going      PT LONG TERM GOAL #3   Title  Pt/husband will verbalize/demonstrate understanding of appropriate floor>stand transfers for safe fall recovery.    Time  6    Period  Weeks    Status  On-going            Plan - 12/24/18 1748    Clinical Impression Statement  Provided fall prevention education this visit and addressed gait with rollator, including education in walking program for home.  Pt seems interested in being able to walk more (with husband's supervision recommended) daily at home.  Pt will continue to benefit from skilled PT to address balance, transfer, gait training for improved mobility and decreased fall risk.    Personal Factors and Comorbidities  Comorbidity 3+    Comorbidities  PMH includes ankle fracture, HTN, hyperlipidemia, recurrent falls    Examination-Activity Limitations  Squat;Stairs;Stand;Transfers;Locomotion Level    Examination-Participation Restrictions  Community Activity    Stability/Clinical Decision Making  Evolving/Moderate complexity    Rehab Potential  Fair   MMSE score 16/30; good family support   PT Frequency  2x / week    PT Duration  6 weeks   plus eval   PT Treatment/Interventions  ADLs/Self Care Home Management;Gait training;DME Instruction;Neuromuscular re-education;Balance training;Therapeutic exercise;Therapeutic activities;Functional mobility training;Patient/family education    PT Next Visit Plan  Check STGs; work on SLS and posterior direction stepping activities; how did walking program go? Need to check about further appts if needed for full POC.  Gait on outdoor surfaces with rollator    Consulted and Agree with Plan of Care  Patient;Family member/caregiver    Family Member Consulted  Husband       Patient will benefit from skilled therapeutic intervention in order to improve the following deficits and impairments:  Abnormal gait, Difficulty walking, Decreased safety awareness, Decreased balance, Decreased  mobility  Visit Diagnosis: Other abnormalities of gait and mobility  Unsteadiness on feet     Problem List Patient Active Problem List   Diagnosis Date Noted  . Fluency disorder associated with underlying disease 02/08/2018  . Cognitive and neurobehavioral dysfunction 02/08/2018  . 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 Arthritis of carpometacarpal [facility]) joint of left thumb 01/24/2018  . Ankle fracture, left 09/03/2016  . Hand dysfunction 03/11/2016  . Symptomatic varicose veins, bilateral 03/11/2016  . Numbness of left thumb 03/11/2016  . Elevated BP 07/27/2014  . Bunion of left foot 07/26/2014  . Abnormality of gait 07/26/2014  . Cough 11/10/2013  . Post-nasal drainage 11/10/2013  . Abnormal urine odor 11/10/2013  . Visit for preventive health examination 07/28/2013  . Right anterior knee pain 07/22/2012  . Medicare annual wellness visit, subsequent 07/25/2011  . ANXIETY, SITUATIONAL 09/25/2009  . ARTHRITIS 09/25/2009  . Vitamin D deficiency 09/27/2008  . Hyperlipidemia 09/17/2008  . Elevated blood pressure reading without diagnosis of hypertension 09/17/2008  . COLONIC POLYPS, HX OF 09/17/2008  . PERSONAL HISTORY DISEASES SKIN&SUBCUT TISSUE 09/17/2008    Bernece Gall W. 12/24/2018, 5:53 PM  Frazier Butt., PT   [place] [address] [place], [place], [address] Phone: [phone]   Fax:  [phone]  Name: Sylvana Bonk MRN: [id number] Date of Birth: 12/17/1940

## 2018-12-27 ENCOUNTER — Ambulatory Visit: Payer: Medicare Other | Admitting: Speech Pathology

## 2018-12-27 ENCOUNTER — Encounter: Payer: Self-pay | Admitting: Physical Therapy

## 2018-12-27 ENCOUNTER — Ambulatory Visit: Payer: Medicare Other | Admitting: Occupational Therapy

## 2018-12-27 ENCOUNTER — Other Ambulatory Visit: Payer: Self-pay

## 2018-12-27 ENCOUNTER — Ambulatory Visit: Payer: Medicare Other | Admitting: Physical Therapy

## 2018-12-27 DIAGNOSIS — R4701 Aphasia: Secondary | ICD-10-CM

## 2018-12-27 DIAGNOSIS — R41841 Cognitive communication deficit: Secondary | ICD-10-CM

## 2018-12-27 DIAGNOSIS — R482 Apraxia: Secondary | ICD-10-CM

## 2018-12-27 DIAGNOSIS — R2681 Unsteadiness on feet: Secondary | ICD-10-CM

## 2018-12-27 DIAGNOSIS — R2689 Other abnormalities of gait and mobility: Secondary | ICD-10-CM

## 2018-12-27 NOTE — Therapy (Signed)
[facility] [address] [place], [place], [address] Phone: [phone]   Fax:  [phone]  Speech Language Pathology Treatment  Patient Details  Name: Pamela Alvarez MRN: [id number] Date of Birth: 19-Mar-1940 Referring Provider (SLP): Dohmeier, Asencion Partridge, MD   Encounter Date: 12/27/2018  End of Session - 12/27/18 1429    Visit Number  8    Number of Visits  17    Date for SLP Re-Evaluation  02/20/19    SLP Start Time  1100    SLP Stop Time   1145    SLP Time Calculation (min)  45 min    Activity Tolerance  Patient tolerated treatment well       Past Medical History:  Diagnosis Date  . Cyst 07-2008   Cyst on back x2 that were drained  . H/O echocardiogram 02-2000  . Hx of colonic polyps   . Hyperlipidemia   . Hypertension   . Varicose veins of both lower extremities     Past Surgical History:  Procedure Laterality Date  . POLYPECTOMY     Colon  . TONSILLECTOMY    . TUBAL LIGATION      There were no vitals filed for this visit.  Subjective Assessment - 12/27/18 1421    Subjective  "Something she's doing is working."    Patient is accompained by:  Family member   husband   Currently in Pain?  No/denies            ADULT SLP TREATMENT - 12/27/18 1421      General Information   Behavior/Cognition  Pleasant mood;Cooperative;Alert;Distractible;Requires cueing      Treatment Provided   Treatment provided  Cognitive-Linquistic      Pain Assessment   Pain Assessment  No/denies pain      Cognitive-Linquistic Treatment   Treatment focused on  Aphasia    Skilled Treatment  Husband reports book completed but hasn't been able to print it yet. Pt/husband report pt using [employer] (not news app) since last visit but "some things are too easy." SLP worked with pt to ID more appropriate tasks for home program and assigned these within the app. Auditory comprehension: 1 step directions 100% accuracy, 2  step (min-mod complex) 80% accuracy. SLP noted pt's impulsivity/working memory may contribute to decreased comprehension. Pt required usual cues to listen to entirety of instructions. Reading comprehension: Level 2 80% accuracy with with sentence completion; occasional min-mod cues to attend to all choices. Word level spelling: extended time and mod-max A. SLP used "Tell me more" assignment to train pt in compensations for anomia (semantic feature analysis). Pt required extended time and frequent mod A to generate associations, properties, categories, locations, etc with common objects.      Assessment / Recommendations / Plan   Plan  Continue with current plan of care      Progression Toward Goals   Progression toward goals  Progressing toward goals       SLP Education - 12/27/18 1429    Education Details  home therapy tasks appropriate to pt's level    Person(s) Educated  Patient;Spouse    Methods  Explanation;Demonstration    Comprehension  Verbalized understanding;Verbal cues required;Need further instruction       SLP Short Term Goals - 12/27/18 1430      SLP SHORT TERM GOAL #1   Title  pt will demo ability for functionalality with simple household langauge tasks (e.g., grocery list, errand list, to-do list) over 3  sessions    Time  1    Period  Weeks    Status  On-going      SLP SHORT TERM GOAL #2   Title  pt will achieve functional communication of wants/needs at home (reported, if needed) over two sessions    Time  1    Period  Weeks    Status  Revised      SLP SHORT TERM GOAL #3   Title  pt will demo correct right/left comprehension in simple functional commands 9/10 opportunities    Time  1    Period  Weeks    Status  On-going      SLP SHORT TERM GOAL #4   Title  pt will complete full aphasia eval before total visit #3    Baseline  form 1 completed 12/06/18    Status  Achieved      SLP SHORT TERM GOAL #5   Title  pt will complete cognitive linguistic eval PRN if  deemed clinically necessary    Time  1    Period  Weeks    Status  On-going       SLP Long Term Goals - 12/27/18 1431      SLP LONG TERM GOAL #1   Title  pt will demonstrate improved verbal communication (as rated by pt) compared to when ST began    Time  5    Period  Weeks    Status  On-going      SLP LONG TERM GOAL #2   Title  pt will report speech and language is less frustrating than prior to initiation of ST    Time  5    Period  Weeks    Status  On-going      SLP LONG TERM GOAL #3   Title  pt will produce functional verbal expression in 3 minutes simple conversation over 3 sessions    Time  5    Period  Weeks    Status  On-going      SLP LONG TERM GOAL #4   Title  pt will use multimodal communication PRN to express wants/needs    Time  5    Period  Weeks    Status  On-going      SLP LONG TERM GOAL #5   Title  pt will functionally write her first name independently in 7/10 attempts over 3 sessions    Time  5    Period  Weeks    Status  On-going       Plan - 12/27/18 1430    Clinical Impression Statement  Pt continues to present as an atypical TBI pt or CVA pt with mild-moderate (more moderate) anomic aphasia per Western Aphasia Battery-Revised. Possible cognitive testing to occur in subsequent sessions. See "skilled intervention" for details on today's session. Per husband, pt working with therapy tasks in [employer] app 30 min daily. SLP recommends cont'd course of skilled ST to address pt's decr'd language-cogniton ability/possible verbal apraxia.to incr. pt independence.    Speech Therapy Frequency  2x / week    Duration  --   8 weeks or 17 visits   Treatment/Interventions  SLP instruction and feedback;Compensatory strategies;Patient/family education;Multimodal communcation approach;Cueing hierarchy;Language facilitation;Functional tasks;Oral motor exercises;Internal/external aids;Environmental controls;Cognitive reorganization    Potential to Achieve Goals  Fair     Potential Considerations  Severity of impairments;Other (comment)   time post onset of symptoms/peculiar presentation given suspected TBI (suspect language skills to have improved and  not declined over time)   Consulted and Agree with Plan of Care  Patient       Patient will benefit from skilled therapeutic intervention in order to improve the following deficits and impairments:   Aphasia  Verbal apraxia  Cognitive communication deficit    Problem List Patient Active Problem List   Diagnosis Date Noted  . Fluency disorder associated with underlying disease 02/08/2018  . Cognitive and neurobehavioral dysfunction 02/08/2018  . 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 Arthritis of carpometacarpal [facility]) joint of left thumb 01/24/2018  . Ankle fracture, left 09/03/2016  . Hand dysfunction 03/11/2016  . Symptomatic varicose veins, bilateral 03/11/2016  . Numbness of left thumb 03/11/2016  . Elevated BP 07/27/2014  . Bunion of left foot 07/26/2014  . Abnormality of gait 07/26/2014  . Cough 11/10/2013  . Post-nasal drainage 11/10/2013  . Abnormal urine odor 11/10/2013  . Visit for preventive health examination 07/28/2013  . Right anterior knee pain 07/22/2012  . Medicare annual wellness visit, subsequent 07/25/2011  . ANXIETY, SITUATIONAL 09/25/2009  . ARTHRITIS 09/25/2009  . Vitamin D deficiency 09/27/2008  . Hyperlipidemia 09/17/2008  . Elevated blood pressure reading without diagnosis of hypertension 09/17/2008  . COLONIC POLYPS, HX OF 09/17/2008  . PERSONAL HISTORY DISEASES SKIN&SUBCUT TISSUE 09/17/2008   Rondel Baton, MS, CCC-SLP Speech-Language Pathologist  Arlana Lindau 12/27/2018, 2:32 PM  [facility] [address] [place], [place], [address] Phone: [phone]   Fax:  [phone]   Name: Edwina Grossberg MRN: [id number] Date of Birth: June 01, 1940

## 2018-12-28 NOTE — Therapy (Signed)
[facility] [address] [place], [place], [address] Phone: [phone]   Fax:  [phone]  Physical Therapy Treatment  Patient Details  Name: Pamela ApleySandra Alvarez MRN: [id number] Date of Birth: 01/29/1941 Referring Provider (PT): Dohmeier, Porfirio Mylararmen   Encounter Date: 12/27/2018  PT End of Session - 12/28/18 1334    Visit Number  8    Number of Visits  13    Date for PT Re-Evaluation  01/21/19    Authorization Type  [employer] Medicare-will need 10th visit progress note    PT Start Time  0931    PT Stop Time  1015    PT Time Calculation (min)  44 min    Equipment Utilized During Treatment  Gait belt    Activity Tolerance  Patient tolerated treatment well    Behavior During Therapy  Impulsive   Impulsive with transfers      Past Medical History:  Diagnosis Date  . Cyst 07-2008   Cyst on back x2 that were drained  . H/O echocardiogram 02-2000  . Hx of colonic polyps   . Hyperlipidemia   . Hypertension   . Varicose veins of both lower extremities     Past Surgical History:  Procedure Laterality Date  . POLYPECTOMY     Colon  . TONSILLECTOMY    . TUBAL LIGATION      There were no vitals filed for this visit.  Subjective Assessment - 12/27/18 0932    Subjective  Things going okay today.  No falls, no pain.  Husband reports up to 6 minutes with rollator; she goes very fast.    Currently in Pain?  No/denies                       [facility] Adult PT Treatment/Exercise - 12/28/18 1330      Transfers   Transfers  Sit to Stand;Stand to Sit    Sit to Stand  5: Supervision;4: Min guard;From bed    Sit to Stand Details  Tactile cues for weight beaing;Visual cues/gestures for precautions/safety;Verbal cues for technique;Tactile cues for weight shifting    Stand to Sit  5: Supervision;With upper extremity assist;To bed;To chair/3-in-1    Number of Reps  Other reps (comment)   at least 5 reps throughout session   Comments  Initiated transfer practice, using green therapy ball in front, seated postion, working on increased trunk flexion, x 10 reps (PT has to keep pt's hands on ball to encourage forward trunk excursion).  Then transition to sit>stand holding 6" ball, for increased forward lean>stand up tall/reach high/increased squatting to sit.        Ambulation/Gait   Ambulation/Gait  Yes    Ambulation/Gait Assistance  4: Min guard;5: Supervision    Ambulation/Gait Assistance Details  Added 10# weight into basket of rollator, to encourage increased proprioception for slowed walking pace.    Ambulation Distance (Feet)  400 Feet   x 2, then negotiating around furnituer   Assistive device  Rollator    Gait Pattern  Step-through pattern;Decreased step length - right;Decreased step length - left;Shuffle;Decreased dorsiflexion - right;Decreased dorsiflexion - left    Gait Comments  Additional gait with rollator, using 10# weight in rollator basket, to work on slowed pace of gait, improved control for home, including turns around furniture.  Two episodes of bumping into obstacles on L side. Husband feels they have weights that they can use in her rollator at home.  Standardized Balance Assessment   Standardized Balance Assessment  Timed Up and Go Test;Berg Balance Test      Berg Balance Test   Sit to Stand  Able to stand  independently using hands    Standing Unsupported  Able to stand 2 minutes with supervision    Sitting with Back Unsupported but Feet Supported on Floor or Stool  Able to sit safely and securely 2 minutes    Stand to Sit  Uses backs of legs against chair to control descent    Transfers  Able to transfer with verbal cueing and /or supervision    Standing Unsupported with Eyes Closed  Able to stand 10 seconds with supervision    Standing Ubsupported with Feet Together  Able to place feet together independently and stand for 1 minute with supervision    From Standing, Reach Forward with  Outstretched Arm  Can reach forward >5 cm safely (2")   4"   From Standing Position, Pick up Object from Floor  Able to pick up shoe, needs supervision    From Standing Position, Turn to Look Behind Over each Shoulder  Looks behind one side only/other side shows less weight shift    Turn 360 Degrees  Needs close supervision or verbal cueing    Standing Unsupported, Alternately Place Feet on Step/Stool  Needs assistance to keep from falling or unable to try    Standing Unsupported, One Foot in Front  Able to take small step independently and hold 30 seconds    Standing on One Leg  Unable to try or needs assist to prevent fall    Total Score  31      Timed Up and Go Test   TUG  Normal TUG    Normal TUG (seconds)  25.1   25.46 no device; 31.91 with rollator; both with min guard            PT Education - 12/28/18 1334    Education Details  Potential use of weights in basket of rollator to help with slowed pace of gait.    Person(s) Educated  Patient;Spouse    Methods  Explanation;Demonstration;Verbal cues    Comprehension  Verbalized understanding;Returned demonstration;Verbal cues required       PT Short Term Goals - 12/28/18 1335      PT SHORT TERM GOAL #1   Title  Pt will perform HEP with family supervision for improved transfers, balance, and gait.  UPDATED TARGET 12/30/2018 ( due to later start with later scheduling)    Time  4    Period  Weeks    Status  On-going      PT SHORT TERM GOAL #2   Title  Pt will perform 4 of 5 reps of sit<>stand transfers with correct technique, with husband supervision and cues, for improved safety with transfers.    Time  4    Period  Weeks    Status  On-going      PT SHORT TERM GOAL #3   Title  Pt will improve Berg Balance score to at least 24/56 for decreased fall risk.    Baseline  31/56 12/27/2018    Time  4    Period  Weeks    Status  Achieved      PT SHORT TERM GOAL #4   Title  Pt will improve TUG score to less than or equal  to 23 seconds for decreased fall risk.    Time  4    Period  Weeks    Status  On-going      PT SHORT TERM GOAL #5   Title  Pt/husband will verbalize understanding of fall prevention in home environment.    Time  4    Period  Weeks    Status  Achieved        PT Long Term Goals - 12/21/18 1422      PT LONG TERM GOAL #1   Title  Pt will perform updated HEP for balance, gait, with husband's supervision.  UPDATED TARGET 111/08/2018 (delayed due to delayed scheduling)    Time  6    Period  Weeks    Status  On-going      PT LONG TERM GOAL #2   Title  Pt will improve gait velocity to at  least 1.5 ft/sec with appropriate device and supervision, for improved gait efficiency.    Time  6    Period  Weeks    Status  On-going      PT LONG TERM GOAL #3   Title  Pt/husband will verbalize/demonstrate understanding of appropriate floor>stand transfers for safe fall recovery.    Time  6    Period  Weeks    Status  On-going            Plan - 12/28/18 1336    Clinical Impression Statement  Began assessing STGs this visit, with pt meeting STG 3 for improved Berg score and STG 5 for fall prevention education.  Pt has improved TUG score slightly, but not yet to goal level.  Pt's husband feels he can notice a definite difference in her mobility and motivation since beginning therapy.  She is making progress towards goals and will continue to benefit from skilled PT to further address balance, transfers, gait for improved functional mobility and decreased fall risk.    Personal Factors and Comorbidities  Comorbidity 3+    Comorbidities  PMH includes ankle fracture, HTN, hyperlipidemia, recurrent falls    Examination-Activity Limitations  Squat;Stairs;Stand;Transfers;Locomotion Level    Examination-Participation Restrictions  Community Activity    Stability/Clinical Decision Making  Evolving/Moderate complexity    Rehab Potential  Fair   MMSE score 16/30; good family support   PT Frequency  2x  / week    PT Duration  6 weeks   plus eval   PT Treatment/Interventions  ADLs/Self Care Home Management;Gait training;DME Instruction;Neuromuscular re-education;Balance training;Therapeutic exercise;Therapeutic activities;Functional mobility training;Patient/family education    PT Next Visit Plan  Check remaining STGs, gait on outdoor surfaces with rollator; SLS activities, exercises for psoterior direction stepping    Consulted and Agree with Plan of Care  Patient;Family member/caregiver    Family Member Consulted  Husband       Patient will benefit from skilled therapeutic intervention in order to improve the following deficits and impairments:  Abnormal gait, Difficulty walking, Decreased safety awareness, Decreased balance, Decreased mobility  Visit Diagnosis: Other abnormalities of gait and mobility  Unsteadiness on feet     Problem List Patient Active Problem List   Diagnosis Date Noted  . Fluency disorder associated with underlying disease 02/08/2018  . Cognitive and neurobehavioral dysfunction 02/08/2018  . 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 Arthritis of carpometacarpal [facility]) joint of left thumb 01/24/2018  . Ankle fracture, left 09/03/2016  . Hand dysfunction 03/11/2016  . Symptomatic varicose veins, bilateral 03/11/2016  . Numbness of left thumb 03/11/2016  . Elevated BP 07/27/2014  . Bunion of left foot 07/26/2014  . Abnormality of gait 07/26/2014  .  Cough 11/10/2013  . Post-nasal drainage 11/10/2013  . Abnormal urine odor 11/10/2013  . Visit for preventive health examination 07/28/2013  . Right anterior knee pain 07/22/2012  . Medicare annual wellness visit, subsequent 07/25/2011  . ANXIETY, SITUATIONAL 09/25/2009  . ARTHRITIS 09/25/2009  . Vitamin D deficiency 09/27/2008  . Hyperlipidemia 09/17/2008  . Elevated blood pressure reading without diagnosis of hypertension 09/17/2008  . COLONIC POLYPS, HX OF 09/17/2008  . PERSONAL HISTORY DISEASES SKIN&SUBCUT  TISSUE 09/17/2008    Phuoc Huy W. 12/28/2018, 1:39 PM  Frazier Butt., PT   [facility] [address] [place], [place], [address] Phone: [phone]   Fax:  [phone]  Name: Pamela Alvarez MRN: [id number] Date of Birth: 1940-09-30

## 2018-12-29 ENCOUNTER — Encounter: Payer: Self-pay | Admitting: Physical Therapy

## 2018-12-29 ENCOUNTER — Ambulatory Visit: Payer: Medicare Other | Admitting: Speech Pathology

## 2018-12-29 ENCOUNTER — Encounter: Payer: Self-pay | Admitting: Occupational Therapy

## 2018-12-29 ENCOUNTER — Ambulatory Visit: Payer: Medicare Other | Admitting: Physical Therapy

## 2018-12-29 ENCOUNTER — Ambulatory Visit: Payer: Medicare Other | Admitting: Occupational Therapy

## 2018-12-29 ENCOUNTER — Other Ambulatory Visit: Payer: Self-pay

## 2018-12-29 DIAGNOSIS — R2689 Other abnormalities of gait and mobility: Secondary | ICD-10-CM

## 2018-12-29 DIAGNOSIS — R2681 Unsteadiness on feet: Secondary | ICD-10-CM

## 2018-12-29 DIAGNOSIS — R4701 Aphasia: Secondary | ICD-10-CM

## 2018-12-29 DIAGNOSIS — R278 Other lack of coordination: Secondary | ICD-10-CM

## 2018-12-29 DIAGNOSIS — R41840 Attention and concentration deficit: Secondary | ICD-10-CM

## 2018-12-29 DIAGNOSIS — R41844 Frontal lobe and executive function deficit: Secondary | ICD-10-CM

## 2018-12-29 DIAGNOSIS — M6281 Muscle weakness (generalized): Secondary | ICD-10-CM

## 2018-12-29 DIAGNOSIS — R41841 Cognitive communication deficit: Secondary | ICD-10-CM

## 2018-12-29 NOTE — Therapy (Signed)
[facility] [address] [place], [place], [address] Phone: [phone]   Fax:  [phone]  Occupational Therapy Treatment  Patient Details  Name: Pamela Alvarez MRN: [id number] Date of Birth: 1940-11-21 No data recorded  Encounter Date: 12/29/2018  OT End of Session - 12/29/18 0940    Visit Number  6    Number of Visits  13    Date for OT Re-Evaluation  01/06/19    Authorization Type  [employer] Medicare, no auth    Authorization - Visit Number  6    Authorization - Number of Visits  10    OT Start Time  6073    OT Stop Time  1015    OT Time Calculation (min)  40 min    Activity Tolerance  Patient tolerated treatment well    Behavior During Therapy  Restless;Impulsive       Past Medical History:  Diagnosis Date  . Cyst 07-2008   Cyst on back x2 that were drained  . H/O echocardiogram 02-2000  . Hx of colonic polyps   . Hyperlipidemia   . Hypertension   . Varicose veins of both lower extremities     Past Surgical History:  Procedure Laterality Date  . POLYPECTOMY     Colon  . TONSILLECTOMY    . TUBAL LIGATION      There were no vitals filed for this visit.  Subjective Assessment - 12/29/18 0938    Subjective   she's doing better with her left hand with the coordination HEP, writing improved, reaching some with her LUE per husband and reaching higher with LUE.   Pt reports that husband doesn't let her do much.  (I'm not the most patient--per husband, but husband reports performing HEP regularly and that it's helping)    Patient is accompanied by:  Family member    Pertinent History  78 y.o female with recent declining cognitive abilities, per MRI -right frontal, right parietal and right cerebellar encephalomalacia and gliosis, possible embolic chronic ischemic infarcts.Pt has hx of ankle fx, hyperlipidemia, and thumb CMC arthritis. Per pt's husband MD is still trying to determine a definative diagnosis, per chart,  possible PSP vs. TBI.    Limitations  fall risk    Patient Stated Goals  improved use of L hand, to be as independent as possible    Currently in Pain?  No/denies               Treatment: Cane exercises in seated for chest press, shoulder abduction and shoulder flexion, mod v.c and min facilitation 10 reps each Functional reaching with left and right UE's to place graded clothespins on vertical antennae, min difficulty v.c Pt maintained good attention to task Placing various sized pegs into semicircle with left and right UE's, pt spontaneously started task with LUE. She demonstrates improving coordination and LUE function Arm bike x 5 mins level 1 for conditioning. Cueing when sitting down required as pt misjudges when she is lined up with the chair. Pt demonstrates improving attention to task and spontaneous use of LUE. Plan to renew and see pt for more visits after initial renewal period.               OT Short Term Goals - 12/29/18 7106      OT SHORT TERM GOAL #1   Title  Pt/ caregiver will be I with HEP.    Time  4    Period  Weeks  Status  Achieved    Target Date  12/22/18      OT SHORT TERM GOAL #2   Title  Pt/ husband will verbalize understanding of adapted strategies to maximize safety and independence with ADLs/ IADLs.    Time  4    Period  Weeks    Status  On-going   needs reinforcement/ continued education.     OT SHORT TERM GOAL #3   Title  Pt will demonstrate ability to retrieve a lightweight object at 100 shoulder flexion with LUE.    Time  4    Period  Weeks    Status  Achieved   100     OT SHORT TERM GOAL #4   Title  Pt will demonstrate ability to retrieve a lightweight object at 115 shoulder flexion with RUE.    Time  4    Period  Weeks    Status  Achieved   120       OT Long Term Goals - 11/22/18 1424      OT LONG TERM GOAL #1   Title  Pt will increase LUE funtional use and reach as eveidenced by increasing LUE box/ blocks to  15 blocks.    Time  8    Period  Weeks    Status  New      OT LONG TERM GOAL #2   Title  Pt will increase LUE grip strength to 25 lbs or greater for increased ease with ADLS.    Time  8    Period  Weeks    Status  New      OT LONG TERM GOAL #3   Title  Pt will demonstrate ability to write her name legibly with good letter size x 3  trials.    Time  8    Period  Weeks    Status  New      OT LONG TERM GOAL #4   Title  Pt will demonstrate ability to retrieve a lightweight object aty 105 shoulder flexion with LUE.    Time  8    Period  Weeks    Status  New              Patient will benefit from skilled therapeutic intervention in order to improve the following deficits and impairments:           Visit Diagnosis: Other abnormalities of gait and mobility  Unsteadiness on feet  Frontal lobe and executive function deficit  Attention and concentration deficit  Other lack of coordination  Muscle weakness (generalized)    Problem List Patient Active Problem List   Diagnosis Date Noted  . Fluency disorder associated with underlying disease 02/08/2018  . Cognitive and neurobehavioral dysfunction 02/08/2018  . 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 Arthritis of carpometacarpal [facility] joint of left thumb 01/24/2018  . Ankle fracture, left 09/03/2016  . Hand dysfunction 03/11/2016  . Symptomatic varicose veins, bilateral 03/11/2016  . Numbness of left thumb 03/11/2016  . Elevated BP 07/27/2014  . Bunion of left foot 07/26/2014  . Abnormality of gait 07/26/2014  . Cough 11/10/2013  . Post-nasal drainage 11/10/2013  . Abnormal urine odor 11/10/2013  . Visit for preventive health examination 07/28/2013  . Right anterior knee pain 07/22/2012  . Medicare annual wellness visit, subsequent 07/25/2011  . ANXIETY, SITUATIONAL 09/25/2009  . ARTHRITIS 09/25/2009  . Vitamin D deficiency 09/27/2008  . Hyperlipidemia 09/17/2008  . Elevated blood pressure reading without  diagnosis of hypertension  09/17/2008  . COLONIC POLYPS, HX OF 09/17/2008  . PERSONAL HISTORY DISEASES SKIN&SUBCUT TISSUE 09/17/2008    , 12/29/2018, 9:56 AM  [facility] [address] [place], [place], [address] Phone: [phone]   Fax:  [phone]  Name: Pamela Alvarez MRN: [id number] Date of Birth: 13-Sep-1940

## 2018-12-29 NOTE — Therapy (Signed)
[facility] [address] [place], [place], [address] Phone: [phone]   Fax:  [phone]  Physical Therapy Treatment  Patient Details  Name: Pamela Alvarez MRN: [id number] Date of Birth: 13-Aug-1940 Referring Provider (PT): Dohmeier, Asencion Partridge   Encounter Date: 12/29/2018  PT End of Session - 12/29/18 1153    Visit Number  9    Number of Visits  13    Date for PT Re-Evaluation  01/21/19    Authorization Type  [employer] Medicare-will need 10th visit progress note    PT Start Time  1102    PT Stop Time  1144    PT Time Calculation (min)  42 min    Activity Tolerance  Patient tolerated treatment well    Behavior During Therapy  [facility] for tasks assessed/performed       Past Medical History:  Diagnosis Date  . Cyst 07-2008   Cyst on back x2 that were drained  . H/O echocardiogram 02-2000  . Hx of colonic polyps   . Hyperlipidemia   . Hypertension   . Varicose veins of both lower extremities     Past Surgical History:  Procedure Laterality Date  . POLYPECTOMY     Colon  . TONSILLECTOMY    . TUBAL LIGATION      There were no vitals filed for this visit.  Subjective Assessment - 12/29/18 1102    Subjective  Didn't do our homework yesterday, [place]    Patient Stated Goals  Pt's goal for therapy is better balance and walking.    Currently in Pain?  No/denies                       [facility] Adult PT Treatment/Exercise - 12/29/18 0001      Transfers   Transfers  Sit to Stand;Stand to Sit    Sit to Stand  5: Supervision;4: Min guard;From bed    Sit to Stand Details  Verbal cues for sequencing;Verbal cues for technique    Sit to Stand Details (indicate cue type and reason)  Pt demonstrates less posterior lean, posterior pushing through legs onto mat/chair    Stand to Sit  5: Supervision;With upper extremity assist;To bed;To chair/3-in-1    Number of Reps  Other reps  (comment);2 sets   5 reps each, from chair, mat     Ambulation/Gait   Ambulation/Gait  Yes    Ambulation/Gait Assistance  4: Min guard;5: Supervision    Ambulation Distance (Feet)  400 Feet    Assistive device  Rollator    Gait Pattern  Step-through pattern;Decreased step length - right;Decreased step length - left;Shuffle;Decreased dorsiflexion - right;Decreased dorsiflexion - left    Gait Comments  Outdoor gait using rollator, with PT providing assistance to slow rollator on declines; cues to stay close to rollator; pt is able to pull rollator close, but does keep feet posteiror to wheels.      Timed Up and Go Test   TUG  Normal TUG    Normal TUG (seconds)  25.68   25.88 no device (both times)         Balance Exercises - 12/29/18 1109      Balance Exercises: Standing   Other Standing Exercises  Posterior step and weightshift x 10 reps (manual cues provided for weightshifting through hips; posterior hip kicks x 10 reps; stagger stance forward/back weightshifting x 10 reps, with manual cues given at  hips      OTAGO PROGRAM   Head Movements  Sitting;5 reps    Neck Movements  Sitting;5 reps    Back Extension  Standing;5 reps    Trunk Movements  Standing;5 reps    Ankle Movements  Sitting;10 reps    Knee Extensor  10 reps    Knee Flexor  10 reps    Hip ABductor  10 reps    Ankle Plantorflexors  20 reps, support    Ankle Dorsiflexors  20 reps, support    Backwards Walking  Support    Sideways Walking  Assistive device    Tandem Stance  10 seconds, support    One Leg Stand  10 seconds, support    Overall OTAGO Comments  Review of HEP-pt return demo understanding with PT providing verbal cues (pictures are at home)      Short distance gait in clinic, 25 ft x 4 reps, with no device, with PT providing min guard.    PT Short Term Goals - 12/29/18 1103      PT SHORT TERM GOAL #1   Title  Pt will perform HEP with family supervision for improved transfers, balance, and gait.   UPDATED TARGET 12/30/2018 ( due to later start with later scheduling)    Time  4    Period  Weeks    Status  Achieved      PT SHORT TERM GOAL #2   Title  Pt will perform 4 of 5 reps of sit<>stand transfers with correct technique, with husband supervision and cues, for improved safety with transfers.    Time  4    Period  Weeks    Status  Achieved      PT SHORT TERM GOAL #3   Title  Pt will improve Berg Balance score to at least 24/56 for decreased fall risk.    Baseline  31/56 12/27/2018    Time  4    Period  Weeks    Status  Achieved      PT SHORT TERM GOAL #4   Title  Pt will improve TUG score to less than or equal to 23 seconds for decreased fall risk.    Baseline  10/22:  25.68 sec, 25.88 sec    Time  4    Period  Weeks    Status  Not Met      PT SHORT TERM GOAL #5   Title  Pt/husband will verbalize understanding of fall prevention in home environment.    Time  4    Period  Weeks    Status  Achieved        PT Long Term Goals - 12/21/18 1422      PT LONG TERM GOAL #1   Title  Pt will perform updated HEP for balance, gait, with husband's supervision.  UPDATED TARGET 111/08/2018 (delayed due to delayed scheduling)    Time  6    Period  Weeks    Status  On-going      PT LONG TERM GOAL #2   Title  Pt will improve gait velocity to at  least 1.5 ft/sec with appropriate device and supervision, for improved gait efficiency.    Time  6    Period  Weeks    Status  On-going      PT LONG TERM GOAL #3   Title  Pt/husband will verbalize/demonstrate understanding of appropriate floor>stand transfers for safe fall recovery.    Time  6  Period  Weeks    Status  On-going            Plan - 12/29/18 1154    Clinical Impression Statement  STG 1, 2 met.  STG 4 not met ofr TUG, as pt has improved by several seconds, but not met to goal level, still remains at fall risk. She is progressing with gait and mobility, able to demonstrate slightly less impulsivity with transfers  and improve ability to keep rollator near her with outdoor gait.  She still needs assistance to slow pace of rollator for declines.  She will continue to benefit from skilled PT to address gait, balance,strength for improved mobility.    Personal Factors and Comorbidities  Comorbidity 3+    Comorbidities  PMH includes ankle fracture, HTN, hyperlipidemia, recurrent falls    Examination-Activity Limitations  Squat;Stairs;Stand;Transfers;Locomotion Level    Examination-Participation Restrictions  Community Activity    Stability/Clinical Decision Making  Evolving/Moderate complexity    Rehab Potential  Fair   MMSE score 16/30; good family support   PT Frequency  2x / week    PT Duration  6 weeks   plus eval   PT Treatment/Interventions  ADLs/Self Care Home Management;Gait training;DME Instruction;Neuromuscular re-education;Balance training;Therapeutic exercise;Therapeutic activities;Functional mobility training;Patient/family education    PT Next Visit Plan  Balance activities focusing on SLS activities, exercises for psoterior direction stepping, weightshifting, retro walking    Consulted and Agree with Plan of Care  Patient;Family member/caregiver    Family Member Consulted  Husband      WILL NEED 10th visit PROGRESS NOTE next visit    Patient will benefit from skilled therapeutic intervention in order to improve the following deficits and impairments:  Abnormal gait, Difficulty walking, Decreased safety awareness, Decreased balance, Decreased mobility  Visit Diagnosis: Unsteadiness on feet  Other abnormalities of gait and mobility     Problem List Patient Active Problem List   Diagnosis Date Noted  . Fluency disorder associated with underlying disease 02/08/2018  . Cognitive and neurobehavioral dysfunction 02/08/2018  . 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 Arthritis of carpometacarpal [facility]) joint of left thumb 01/24/2018  . Ankle fracture, left 09/03/2016  . Hand dysfunction  03/11/2016  . Symptomatic varicose veins, bilateral 03/11/2016  . Numbness of left thumb 03/11/2016  . Elevated BP 07/27/2014  . Bunion of left foot 07/26/2014  . Abnormality of gait 07/26/2014  . Cough 11/10/2013  . Post-nasal drainage 11/10/2013  . Abnormal urine odor 11/10/2013  . Visit for preventive health examination 07/28/2013  . Right anterior knee pain 07/22/2012  . Medicare annual wellness visit, subsequent 07/25/2011  . ANXIETY, SITUATIONAL 09/25/2009  . ARTHRITIS 09/25/2009  . Vitamin D deficiency 09/27/2008  . Hyperlipidemia 09/17/2008  . Elevated blood pressure reading without diagnosis of hypertension 09/17/2008  . COLONIC POLYPS, HX OF 09/17/2008  . PERSONAL HISTORY DISEASES SKIN&SUBCUT TISSUE 09/17/2008    Jacarri Gesner W. 12/29/2018, 11:57 AM  Frazier Butt., PT  [facility] [address] [place], [place], [address] Phone: [phone]   Fax:  [phone]  Name: Pamela Alvarez MRN: [id number] Date of Birth: 11-01-1940

## 2018-12-29 NOTE — Therapy (Signed)
[facility] [address] [place], [place], [address] Phone: [phone]   Fax:  [phone]  Speech Language Pathology Treatment  Patient Details  Name: Pamela Alvarez MRN: [id number] Date of Birth: 12-30-1940 Referring Provider (SLP): Dohmeier, Asencion Partridge, MD   Encounter Date: 12/29/2018  End of Session - 12/29/18 1527    Visit Number  9    Number of Visits  17    Date for SLP Re-Evaluation  02/20/19    SLP Start Time  1017    SLP Stop Time   1100    SLP Time Calculation (min)  43 min    Activity Tolerance  Patient tolerated treatment well       Past Medical History:  Diagnosis Date  . Cyst 07-2008   Cyst on back x2 that were drained  . H/O echocardiogram 02-2000  . Hx of colonic polyps   . Hyperlipidemia   . Hypertension   . Varicose veins of both lower extremities     Past Surgical History:  Procedure Laterality Date  . POLYPECTOMY     Colon  . TONSILLECTOMY    . TUBAL LIGATION      There were no vitals filed for this visit.  Subjective Assessment - 12/29/18 1515    Subjective  "It was... nice."            ADULT SLP TREATMENT - 12/29/18 1515      General Information   Behavior/Cognition  Pleasant mood;Cooperative;Alert;Distractible;Requires cueing      Treatment Provided   Treatment provided  Cognitive-Linquistic      Pain Assessment   Pain Assessment  No/denies pain      Cognitive-Linquistic Treatment   Treatment focused on  Cognition;Aphasia    Skilled Treatment  Walking into ST room pt needed cues from husband to sequence sanitizing her hands. Pt/husband had questions re: naming activities on [employer] app, so SLP demonstrated how to perform and to assist pt with this task. SLP began standardized cognitive testing (CLQT), to be completed next session.  Pt had significant difficulty with visuospatial and executive function tasks; suspect non-linguistic cognition will fall in "severe" range.  Pt at times aware of errors (clock drawing), but largely unaware symbol cancellation, trailmaking).       Assessment / Recommendations / Plan   Plan  Continue with current plan of care      Progression Toward Goals   Progression toward goals  Progressing toward goals       SLP Education - 12/29/18 1526    Education Details  cuing hierarchy with naming task on iPad    Person(s) Educated  Patient;Spouse    Methods  Explanation;Demonstration    Comprehension  Verbalized understanding;Need further instruction       SLP Short Term Goals - 12/29/18 1528      SLP Kimberly #1   Title  pt will demo ability for functionalality with simple household langauge tasks (e.g., grocery list, errand list, to-do list) over 3 sessions    Time  1    Period  Weeks    Status  Not Met      SLP SHORT TERM GOAL #2   Title  pt will achieve functional communication of wants/needs at home (reported, if needed) over two sessions    Time  1    Period  Weeks    Status  Not Met      SLP SHORT TERM GOAL #3   Title  pt will  demo correct right/left comprehension in simple functional commands 9/10 opportunities    Time  1    Period  Weeks    Status  Not Met      SLP SHORT TERM GOAL #4   Title  pt will complete full aphasia eval before total visit #3    Baseline  form 1 completed 12/06/18    Status  Achieved      SLP SHORT TERM GOAL #5   Title  pt will complete cognitive linguistic eval PRN if deemed clinically necessary    Baseline  started 12/29/18    Time  1    Period  Weeks    Status  Partially Met       SLP Long Term Goals - 12/29/18 1529      SLP LONG TERM GOAL #1   Title  pt will demonstrate improved verbal communication (as rated by pt) compared to when ST began    Time  5    Period  Weeks    Status  On-going      SLP LONG TERM GOAL #2   Title  pt will report speech and language is less frustrating than prior to initiation of ST    Time  5    Period  Weeks    Status  On-going       SLP LONG TERM GOAL #3   Title  pt will produce functional verbal expression in 3 minutes simple conversation over 3 sessions    Time  5    Period  Weeks    Status  On-going      SLP LONG TERM GOAL #4   Title  pt will use multimodal communication PRN to express wants/needs    Time  5    Period  Weeks    Status  On-going      SLP LONG TERM GOAL #5   Title  pt will functionally write her first name independently in 7/10 attempts over 3 sessions    Time  5    Period  Weeks    Status  On-going       Plan - 12/29/18 1527    Clinical Impression Statement  Pt continues to present as an atypical TBI pt or CVA pt with mild-moderate (more moderate) anomic aphasia per Western Aphasia Battery-Revised. Began Cognitive Linguistic Quick Test today for assessment of cognition. See "skilled intervention" for details on today's session. Per husband, pt working with therapy tasks in [employer] app 30 min daily. SLP recommends cont'd course of skilled ST to address pt's decr'd language-cogniton ability/possible verbal apraxia.to incr. pt independence.    Speech Therapy Frequency  2x / week    Duration  --   8 weeks or 17 visits   Treatment/Interventions  SLP instruction and feedback;Compensatory strategies;Patient/family education;Multimodal communcation approach;Cueing hierarchy;Language facilitation;Functional tasks;Oral motor exercises;Internal/external aids;Environmental controls;Cognitive reorganization    Potential to Achieve Goals  Fair    Potential Considerations  Severity of impairments;Other (comment)   time post onset of symptoms/peculiar presentation given suspected TBI (suspect language skills to have improved and not declined over time)   Consulted and Agree with Plan of Care  Patient       Patient will benefit from skilled therapeutic intervention in order to improve the following deficits and impairments:   Cognitive communication deficit  Aphasia    Problem List Patient  Active Problem List   Diagnosis Date Noted  . Fluency disorder associated with underlying disease 02/08/2018  . Cognitive and  neurobehavioral dysfunction 02/08/2018  . 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 Arthritis of carpometacarpal [facility]) joint of left thumb 01/24/2018  . Ankle fracture, left 09/03/2016  . Hand dysfunction 03/11/2016  . Symptomatic varicose veins, bilateral 03/11/2016  . Numbness of left thumb 03/11/2016  . Elevated BP 07/27/2014  . Bunion of left foot 07/26/2014  . Abnormality of gait 07/26/2014  . Cough 11/10/2013  . Post-nasal drainage 11/10/2013  . Abnormal urine odor 11/10/2013  . Visit for preventive health examination 07/28/2013  . Right anterior knee pain 07/22/2012  . Medicare annual wellness visit, subsequent 07/25/2011  . ANXIETY, SITUATIONAL 09/25/2009  . ARTHRITIS 09/25/2009  . Vitamin D deficiency 09/27/2008  . Hyperlipidemia 09/17/2008  . Elevated blood pressure reading without diagnosis of hypertension 09/17/2008  . COLONIC POLYPS, HX OF 09/17/2008  . PERSONAL HISTORY DISEASES SKIN&SUBCUT TISSUE 09/17/2008   Deneise Lever, [place], CCC-SLP Speech-Language Pathologist  Aliene Altes 12/29/2018, 3:30 PM  [facility] [address] [place], [place], [address] Phone: [phone]   Fax:  [phone]   Name: Adam Demary MRN: [id number] Date of Birth: [birth year]

## 2019-01-03 ENCOUNTER — Ambulatory Visit: Payer: Medicare Other | Admitting: Physical Therapy

## 2019-01-03 ENCOUNTER — Ambulatory Visit: Payer: Medicare Other | Admitting: Occupational Therapy

## 2019-01-03 ENCOUNTER — Other Ambulatory Visit: Payer: Self-pay

## 2019-01-03 ENCOUNTER — Encounter: Payer: Self-pay | Admitting: Occupational Therapy

## 2019-01-03 ENCOUNTER — Encounter: Payer: Self-pay | Admitting: Physical Therapy

## 2019-01-03 ENCOUNTER — Ambulatory Visit: Payer: Medicare Other | Admitting: Speech Pathology

## 2019-01-03 DIAGNOSIS — R2681 Unsteadiness on feet: Secondary | ICD-10-CM | POA: Diagnosis not present

## 2019-01-03 DIAGNOSIS — R41842 Visuospatial deficit: Secondary | ICD-10-CM

## 2019-01-03 DIAGNOSIS — R278 Other lack of coordination: Secondary | ICD-10-CM

## 2019-01-03 DIAGNOSIS — R41840 Attention and concentration deficit: Secondary | ICD-10-CM

## 2019-01-03 DIAGNOSIS — M6281 Muscle weakness (generalized): Secondary | ICD-10-CM

## 2019-01-03 DIAGNOSIS — R2689 Other abnormalities of gait and mobility: Secondary | ICD-10-CM

## 2019-01-03 DIAGNOSIS — R41841 Cognitive communication deficit: Secondary | ICD-10-CM

## 2019-01-03 DIAGNOSIS — R4701 Aphasia: Secondary | ICD-10-CM

## 2019-01-03 DIAGNOSIS — R41844 Frontal lobe and executive function deficit: Secondary | ICD-10-CM

## 2019-01-03 NOTE — Therapy (Signed)
[facility] [address] [place], [place], [address] Phone: [phone]   Fax:  [phone]  Physical Therapy Treatment/10th Visit Progress Note  Patient Details  Name: Pamela Alvarez MRN: [id number] Date of Birth: 08/03/1940 Referring Provider (PT): Dohmeier, Asencion Partridge   Encounter Date: 01/03/2019  PT End of Session - 01/03/19 1303    Visit Number  10    Number of Visits  13    Date for PT Re-Evaluation  01/21/19    Authorization Type  [employer] Medicare-will need 10th visit progress note    PT Start Time  1105    PT Stop Time  1143    PT Time Calculation (min)  38 min    Activity Tolerance  Patient tolerated treatment well    Behavior During Therapy  [facility] for tasks assessed/performed       Past Medical History:  Diagnosis Date  . Cyst 07-2008   Cyst on back x2 that were drained  . H/O echocardiogram 02-2000  . Hx of colonic polyps   . Hyperlipidemia   . Hypertension   . Varicose veins of both lower extremities     Past Surgical History:  Procedure Laterality Date  . POLYPECTOMY     Colon  . TONSILLECTOMY    . TUBAL LIGATION      There were no vitals filed for this visit.  Subjective Assessment - 01/03/19 1107    Subjective  She really likes walking with the rollator.  Nothing new.    Patient Stated Goals  Pt's goal for therapy is better balance and walking.    Currently in Pain?  No/denies                       [facility] Adult PT Treatment/Exercise - 01/03/19 0001      Ambulation/Gait   Ambulation/Gait  Yes    Ambulation/Gait Assistance  4: Min guard   HHA with gait throughout session   Ambulation/Gait Assistance Details  Manual cues for weightshifting, and VCs for increased step length    Ambulation Distance (Feet)  25 Feet   x 4 reps, then 175 ft   Assistive device  Rollator    Gait Pattern  Step-through pattern;Decreased step length - right;Decreased step length - left;Shuffle;Decreased  dorsiflexion - right;Decreased dorsiflexion - left    Ambulation Surface  Level;Indoor          Balance Exercises - 01/03/19 1110      Balance Exercises: Standing   SLS with Vectors  Solid surface;Upper extremity assist 2   See notes below   Wall Bumps  Hip    Wall Bumps-Hips  Eyes opened;Anterior/posterior;10 reps   2 sets; standing at counter, with chair behind   Stepping Strategy  Anterior;Posterior;UE support;10 reps   AT counter, cues for incr. step length, foot clearance   Rockerboard  --    Retro Gait  Upper extremity support;4 reps   In parallel bars, cues for increased step length   Heel Raises Limitations  x 10 rep    Toe Raise Limitations  x 10 rep    Other Standing Exercises  SLS activities at 6" step:  consecutive step taps to 6" step, then to 12" step (with multiple taps to increase SLS time), with BUE support, at least 10 reps each.  Additional set of 10 reps of forward step tap, with forward lean to increase flexibility through hip and lower leg.  Standing with foot propped on 6" step,  with UE alternating arm lifts 2 sets x 5 reps, for lessening UE support with SLS activities.          PT Short Term Goals - 12/29/18 1103      PT SHORT TERM GOAL #1   Title  Pt will perform HEP with family supervision for improved transfers, balance, and gait.  UPDATED TARGET 12/30/2018 ( due to later start with later scheduling)    Time  4    Period  Weeks    Status  Achieved      PT SHORT TERM GOAL #2   Title  Pt will perform 4 of 5 reps of sit<>stand transfers with correct technique, with husband supervision and cues, for improved safety with transfers.    Time  4    Period  Weeks    Status  Achieved      PT SHORT TERM GOAL #3   Title  Pt will improve Berg Balance score to at least 24/56 for decreased fall risk.    Baseline  31/56 12/27/2018    Time  4    Period  Weeks    Status  Achieved      PT SHORT TERM GOAL #4   Title  Pt will improve TUG score to less than or  equal to 23 seconds for decreased fall risk.    Baseline  10/22:  25.68 sec, 25.88 sec    Time  4    Period  Weeks    Status  Not Met      PT SHORT TERM GOAL #5   Title  Pt/husband will verbalize understanding of fall prevention in home environment.    Time  4    Period  Weeks    Status  Achieved        PT Long Term Goals - 01/03/19 1319      PT LONG TERM GOAL #1   Title  Pt will perform updated HEP for balance, gait, with husband's supervision.  UPDATED TARGET 01/27/2019    Time  4   PER RECERT 70/35/0093   Period  Weeks    Status  On-going    Target Date  01/27/19      PT LONG TERM GOAL #2   Title  Pt will improve gait velocity to at  least 1.5 ft/sec with appropriate device and supervision, for improved gait efficiency.    Time  4    Period  Weeks    Status  On-going    Target Date  01/27/19      PT LONG TERM GOAL #3   Title  Pt/husband will verbalize/demonstrate understanding of appropriate floor>stand transfers for safe fall recovery.    Time  4    Period  Weeks    Status  On-going    Target Date  01/27/19      PT LONG TERM GOAL #4   Title  Pt will improve Berg balance score to at least 38/56 for decreased fall risk.    Time  4    Period  Weeks    Status  New    Target Date  01/27/19            Plan - 01/03/19 1304    Clinical Impression Statement  10th Visit Progress note:  11/22/2018-01/03/2019:  Recent objective measures taken last week:  Berg score 31/56 (improved from 19/56), TUG score 25.88 sec.  Pt has recently met 4 of 5 STGs.  She has demonstrated improvement in balance measures  and participation in exercises and walking at home.  She remains at fall risk per Merrilee Jansky and TUG scores and would continue to benefit from skilled PT to further address balance, strength, gait for overall improved functional mobility and decreased fall risk.    Personal Factors and Comorbidities  Comorbidity 3+    Comorbidities  PMH includes ankle fracture, HTN,  hyperlipidemia, recurrent falls    Examination-Activity Limitations  Squat;Stairs;Stand;Transfers;Locomotion Level    Examination-Participation Restrictions  Community Activity    Stability/Clinical Decision Making  Evolving/Moderate complexity    Rehab Potential  Fair   MMSE score 16/30; good family support   PT Frequency  2x / week    PT Duration  4 weeks   per recert 51/88/41, including this week   PT Treatment/Interventions  ADLs/Self Care Home Management;Gait training;DME Instruction;Neuromuscular re-education;Balance training;Therapeutic exercise;Therapeutic activities;Functional mobility training;Patient/family education    PT Next Visit Plan  Balance activities focusing on SLS activities, exercises for psoterior direction stepping, weightshifting, retro walking;  work towards floor transfers    Consulted and Agree with Plan of Care  Patient;Family member/caregiver    Family Member Consulted  Husband       Patient will benefit from skilled therapeutic intervention in order to improve the following deficits and impairments:  Abnormal gait, Difficulty walking, Decreased safety awareness, Decreased balance, Decreased mobility  Visit Diagnosis: Unsteadiness on feet  Other abnormalities of gait and mobility  Muscle weakness (generalized)     Problem List Patient Active Problem List   Diagnosis Date Noted  . Fluency disorder associated with underlying disease 02/08/2018  . Cognitive and neurobehavioral dysfunction 02/08/2018  . 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 Arthritis of carpometacarpal [facility]) joint of left thumb 01/24/2018  . Ankle fracture, left 09/03/2016  . Hand dysfunction 03/11/2016  . Symptomatic varicose veins, bilateral 03/11/2016  . Numbness of left thumb 03/11/2016  . Elevated BP 07/27/2014  . Bunion of left foot 07/26/2014  . Abnormality of gait 07/26/2014  . Cough 11/10/2013  . Post-nasal drainage 11/10/2013  . Abnormal urine odor 11/10/2013  . Visit  for preventive health examination 07/28/2013  . Right anterior knee pain 07/22/2012  . Medicare annual wellness visit, subsequent 07/25/2011  . ANXIETY, SITUATIONAL 09/25/2009  . ARTHRITIS 09/25/2009  . Vitamin D deficiency 09/27/2008  . Hyperlipidemia 09/17/2008  . Elevated blood pressure reading without diagnosis of hypertension 09/17/2008  . COLONIC POLYPS, HX OF 09/17/2008  . PERSONAL HISTORY DISEASES SKIN&SUBCUT TISSUE 09/17/2008    Frazier Butt. 01/03/2019, 1:22 PM  Frazier Butt., PT   [facility] [address] [place], [place], [address] Phone: [phone]   Fax:  [phone]  Name: Pamela Alvarez MRN: [id number] Date of Birth: 07-Jun-1940

## 2019-01-03 NOTE — Therapy (Signed)
[facility] [address] [place], [place], [address] Phone: [phone]   Fax:  [phone]  Occupational Therapy Treatment  Patient Details  Name: Pamela Alvarez MRN: [id number] Date of Birth: 11-Sep-1940 No data recorded  Encounter Date: 01/03/2019  OT End of Session - 01/03/19 1018    Visit Number  7    Number of Visits  13    Date for OT Re-Evaluation  01/20/19   date corrected due to delay instarting tx   Authorization Type  [facility] Medicare, no auth    Authorization - Visit Number  7    Authorization - Number of Visits  10    OT Start Time  1020    OT Stop Time  1100    OT Time Calculation (min)  40 min    Activity Tolerance  Patient tolerated treatment well    Behavior During Therapy  Restless;Impulsive       Past Medical History:  Diagnosis Date  . Cyst 07-2008   Cyst on back x2 that were drained  . H/O echocardiogram 02-2000  . Hx of colonic polyps   . Hyperlipidemia   . Hypertension   . Varicose veins of both lower extremities     Past Surgical History:  Procedure Laterality Date  . POLYPECTOMY     Colon  . TONSILLECTOMY    . TUBAL LIGATION      There were no vitals filed for this visit.  Subjective Assessment - 01/03/19 1018    Patient is accompanied by:  Family member    Pertinent History  78 y.o female with recent declining cognitive abilities, per MRI -right frontal, right parietal and right cerebellar encephalomalacia and gliosis, possible embolic chronic ischemic infarcts.Pt has hx of ankle fx, hyperlipidemia, and thumb CMC arthritis. Per pt's husband MD is still trying to determine a definative diagnosis, per chart, possible PSP vs. TBI.    Limitations  fall risk    Patient Stated Goals  improved use of L hand, to be as independent as possible    Currently in Pain?  No/denies        Placing various sized washers on vertical poles with mid-high level reach for incr coordination/LUE  functional use and visual-perceptual skills with min-mod cueing for bilateral hand use (R hand as stabilizer to help with visual perceptual skills) and to use LUE.  Standing with close supervision to fold towels with mod cueing/assist for LUE functional use.  Then wiping table with BUEs (side to side and forward) with min cueing for LUE functional use and directions initially due to apraxia.  Fastening/unfastening large buttons on pt's sweater with min cueing and mod difficulty for alignment.    Arm bike x 5 min level 1 for reciprocal movement with min cueing for positioning.  Placing large pegs in pegboard with LUE with min cueing initially/min difficulty.  Placing small pegs in pill box organizer to sort by color with LUE for incr coordination and cognitive component with min difficulty/assist from R hand.   Recommended folding washcloths/towels, wiping table, pour/measure, stir  in sitting at home.  Pt will need supervision for standing activities.     OT Short Term Goals - 12/29/18 3154      OT SHORT TERM GOAL #1   Title  Pt/ caregiver will be I with HEP.    Time  4    Period  Weeks    Status  Achieved    Target Date  12/22/18  OT SHORT TERM GOAL #2   Title  Pt/ husband will verbalize understanding of adapted strategies to maximize safety and independence with ADLs/ IADLs.    Time  4    Period  Weeks    Status  On-going   needs reinforcement/ continued education.     OT SHORT TERM GOAL #3   Title  Pt will demonstrate ability to retrieve a lightweight object at 100 shoulder flexion with LUE.    Time  4    Period  Weeks    Status  Achieved   100     OT SHORT TERM GOAL #4   Title  Pt will demonstrate ability to retrieve a lightweight object at 115 shoulder flexion with RUE.    Time  4    Period  Weeks    Status  Achieved   120       OT Long Term Goals - 11/22/18 1424      OT LONG TERM GOAL #1   Title  Pt will increase LUE funtional use and reach as eveidenced  by increasing LUE box/ blocks to 15 blocks.    Time  8    Period  Weeks    Status  New      OT LONG TERM GOAL #2   Title  Pt will increase LUE grip strength to 25 lbs or greater for increased ease with ADLS.    Time  8    Period  Weeks    Status  New      OT LONG TERM GOAL #3   Title  Pt will demonstrate ability to write her name legibly with good letter size x 3  trials.    Time  8    Period  Weeks    Status  New      OT LONG TERM GOAL #4   Title  Pt will demonstrate ability to retrieve a lightweight object aty 105 shoulder flexion with LUE.    Time  8    Period  Weeks    Status  New            Plan - 01/03/19 1019    Clinical Impression Statement  Pt is progressing well with improving LUE functional use with less cueing and demo improved attention to task.    OT Occupational Profile and History  Detailed Assessment- Review of Records and additional review of physical, cognitive, psychosocial history related to current functional performance    Occupational performance deficits (Please refer to evaluation for details):  ADL's;IADL's;Leisure;Work;Social Participation    Body Structure / Function / Physical Skills  ADL;UE functional use;Balance;Flexibility;Vision;FMC;ROM;Gait;Coordination;GMC;Sensation;Decreased knowledge of precautions;Decreased knowledge of use of DME;IADL;Strength;Dexterity;Mobility    Cognitive Skills  Attention;Learn;Memory;Problem Solve;Safety Awareness;Sequencing;Thought;Understand    Rehab Potential  Fair    Clinical Decision Making  Several treatment options, min-mod task modification necessary   extra cueing due to aphasia, demonstration   Comorbidities Affecting Occupational Performance:  May have comorbidities impacting occupational performance    Modification or Assistance to Complete Evaluation   Min-Moderate modification of tasks or assist with assess necessary to complete eval   increased time and instruction   OT Frequency  2x / week    aticipate d/c after 4-6 weeks dependent upon pt progress.   OT Duration  8 weeks    OT Treatment/Interventions  Self-care/ADL training;Energy conservation;Visual/perceptual remediation/compensation;Patient/family education;DME and/or AE instruction;Aquatic Therapy;Paraffin;Passive range of motion;Balance training;Fluidtherapy;Cryotherapy;Building services engineerunctional Mobility Training;Therapeutic activities;Manual Therapy;Therapeutic exercise;Moist Heat;Neuromuscular education;Cognitive remediation/compensation    Plan  LUE functional  use (forced use), standing with reaching/simple standing IADL with close supervision (?making sandwich)    Consulted and Agree with Plan of Care  Patient;Family member/caregiver    Family Member Consulted  husband       Patient will benefit from skilled therapeutic intervention in order to improve the following deficits and impairments:   Body Structure / Function / Physical Skills: ADL, UE functional use, Balance, Flexibility, Vision, FMC, ROM, Gait, Coordination, GMC, Sensation, Decreased knowledge of precautions, Decreased knowledge of use of DME, IADL, Strength, Dexterity, Mobility Cognitive Skills: Attention, Learn, Memory, Problem Solve, Safety Awareness, Sequencing, Thought, Understand     Visit Diagnosis: Frontal lobe and executive function deficit  Attention and concentration deficit  Other lack of coordination  Unsteadiness on feet  Muscle weakness (generalized)  Visuospatial deficit  Other abnormalities of gait and mobility    Problem List Patient Active Problem List   Diagnosis Date Noted  . Fluency disorder associated with underlying disease 02/08/2018  . Cognitive and neurobehavioral dysfunction 02/08/2018  . 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 Arthritis of carpometacarpal [facility]) joint of left thumb 01/24/2018  . Ankle fracture, left 09/03/2016  . Hand dysfunction 03/11/2016  . Symptomatic varicose veins, bilateral 03/11/2016  . Numbness of  left thumb 03/11/2016  . Elevated BP 07/27/2014  . Bunion of left foot 07/26/2014  . Abnormality of gait 07/26/2014  . Cough 11/10/2013  . Post-nasal drainage 11/10/2013  . Abnormal urine odor 11/10/2013  . Visit for preventive health examination 07/28/2013  . Right anterior knee pain 07/22/2012  . Medicare annual wellness visit, subsequent 07/25/2011  . ANXIETY, SITUATIONAL 09/25/2009  . ARTHRITIS 09/25/2009  . Vitamin D deficiency 09/27/2008  . Hyperlipidemia 09/17/2008  . Elevated blood pressure reading without diagnosis of hypertension 09/17/2008  . COLONIC POLYPS, HX OF 09/17/2008  . PERSONAL HISTORY DISEASES SKIN&SUBCUT TISSUE 09/17/2008    [facility] 01/03/2019, 3:37 PM  [place] [address] [place], [place], [address] Phone: [phone]   Fax:  [phone]  Name: Pamela Alvarez MRN: [id number] Date of Birth: [birth year]   Vianne Bulls, OTR/L [facility] [address] [place], [place]  [address] [phone] phone [phone] 01/03/19 3:37 PM

## 2019-01-03 NOTE — Therapy (Signed)
[place] [address] [place], [place], [address] Phone: [phone]   Fax:  [phone]  Speech Language Pathology Treatment and Progress Note  Patient Details  Name: Pamela Alvarez MRN: [id number] Date of Birth: 10-08-1940 Referring Provider (SLP): Dohmeier, Asencion Partridge, MD   Encounter Date: 01/03/2019  End of Session - 01/03/19 1308    Visit Number  10    Number of Visits  17    Date for SLP Re-Evaluation  02/20/19    SLP Start Time  0931    SLP Stop Time   1017    SLP Time Calculation (min)  46 min    Activity Tolerance  Patient tolerated treatment well       Past Medical History:  Diagnosis Date  . Cyst 07-2008   Cyst on back x2 that were drained  . H/O echocardiogram 02-2000  . Hx of colonic polyps   . Hyperlipidemia   . Hypertension   . Varicose veins of both lower extremities     Past Surgical History:  Procedure Laterality Date  . POLYPECTOMY     Colon  . TONSILLECTOMY    . TUBAL LIGATION      There were no vitals filed for this visit.  Subjective Assessment - 01/03/19 0937    Subjective  "Yes...No. No"    Patient is accompained by:  Family member    Currently in Pain?  No/denies            ADULT SLP TREATMENT - 01/03/19 0937      General Information   Behavior/Cognition  Pleasant mood;Cooperative;Alert;Distractible;Requires cueing      Treatment Provided   Treatment provided  Cognitive-Linquistic      Pain Assessment   Pain Assessment  No/denies pain      Cognitive-Linquistic Treatment   Treatment focused on  Cognition;Aphasia    Skilled Treatment  Completed cognitive-linguistic testing via CLQT. Non-linguistic cognition score was 3/49 (severe) and linguistic/aphasia score was 34/56 (moderate). SLP feels visuospatial deficits contributing most significantly to pt's non-linguistic cognition score. Pt was often aware of errors (clock drawing, mazes, design generation) and  occasionally abandoned tasks early when she knew she had made errors. Pt's husband told SLP about a communication breakdown that happened over the weekend (pt wanted toileting chair moved to a different bathroom). Husband reported asking y/n questions and ultimately figuring out pt's intent after several hours. SLP used this example to demonstrate how husband could ask open-ended questions to elicit more clues from pt. Using semantic feature analysis and occasional min cues, extended time, pt able to name use, location, and an association for the object. With mod-max cues able to provide some details re: physical description. Pt/husband report pt enjoyed socializing outdoors with friends and "was able to contribute more to the conversation." They report she has most difficulty communicating "abstract thoughts." SLP encouraged them to bring book (visual aid pt's husband put together at home) and told pt/husband we would continue to work on strategies to support pt being able to communicate her thoughts and ideas in addition to her immediate needs.       Assessment / Recommendations / Plan   Plan  Continue with current plan of care      Progression Toward Goals   Progression toward goals  Progressing toward goals       SLP Education - 01/03/19 1150    Education Details  supportive conversation strategies, questions to elicit description    Person(s) Educated  Patient;Spouse  Methods  Explanation;Demonstration;Verbal cues    Comprehension  Verbalized understanding;Returned demonstration;Need further instruction       SLP Short Term Goals - 01/03/19 1309      SLP SHORT TERM GOAL #1   Title  pt will demo ability for functionalality with simple household langauge tasks (e.g., grocery list, errand list, to-do list) over 3 sessions    Time  1    Period  Weeks    Status  Not Met      SLP SHORT TERM GOAL #2   Title  pt will achieve functional communication of wants/needs at home (reported, if  needed) over two sessions    Time  1    Period  Weeks    Status  Not Met      SLP SHORT TERM GOAL #3   Title  pt will demo correct right/left comprehension in simple functional commands 9/10 opportunities    Time  1    Period  Weeks    Status  Not Met      SLP SHORT TERM GOAL #4   Title  pt will complete full aphasia eval before total visit #3    Baseline  form 1 completed 12/06/18    Status  Achieved      SLP SHORT TERM GOAL #5   Title  pt will complete cognitive linguistic eval PRN if deemed clinically necessary    Baseline  started 12/29/18    Time  1    Period  Weeks    Status  Partially Met       SLP Long Term Goals - 01/03/19 1309      SLP LONG TERM GOAL #1   Title  pt will demonstrate improved verbal communication (as rated by pt) compared to when ST began    Time  4    Period  Weeks    Status  On-going      SLP LONG TERM GOAL #2   Title  pt will report speech and language is less frustrating than prior to initiation of ST    Time  4    Period  Weeks    Status  On-going      SLP LONG TERM GOAL #3   Title  pt will produce functional verbal expression in 3 minutes simple conversation over 3 sessions    Time  4    Period  Weeks    Status  On-going      SLP LONG TERM GOAL #4   Title  pt will use multimodal communication PRN to express wants/needs    Time  4    Period  Weeks    Status  On-going      SLP LONG TERM GOAL #5   Title  pt will functionally write her first name independently in 7/10 attempts over 3 sessions    Time  4    Period  Weeks    Status  On-going       Plan - 01/03/19 1308    Clinical Impression Statement  Pt continues to present as an atypical TBI pt or CVA pt with mild-moderate (more moderate) anomic aphasia per Western Aphasia Battery-Revised. Completed Cognitive Linguistic Quick Test today for assessment of cognition which reveals severe non-linguistic cognitive deficits and moderate aphasia. See "skilled intervention" for details on  today's session. Per husband, pt working with therapy tasks in [employer] app 30 min daily. SLP recommends cont'd course of skilled ST to address pt's decr'd language-cogniton ability/possible verbal apraxia.to  incr. pt independence    Speech Therapy Frequency  2x / week    Duration  --   8 weeks or 17 visits   Treatment/Interventions  SLP instruction and feedback;Compensatory strategies;Patient/family education;Multimodal communcation approach;Cueing hierarchy;Language facilitation;Functional tasks;Oral motor exercises;Internal/external aids;Environmental controls;Cognitive reorganization    Potential to Achieve Goals  Fair    Potential Considerations  Severity of impairments;Other (comment)   time post onset of symptoms/peculiar presentation given suspected TBI (suspect language skills to have improved and not declined over time)   Consulted and Agree with Plan of Care  Patient       Patient will benefit from skilled therapeutic intervention in order to improve the following deficits and impairments:   Aphasia  Cognitive communication deficit    Problem List Patient Active Problem List   Diagnosis Date Noted  . Fluency disorder associated with underlying disease 02/08/2018  . Cognitive and neurobehavioral dysfunction 02/08/2018  . 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 Arthritis of carpometacarpal [facility]) joint of left thumb 01/24/2018  . Ankle fracture, left 09/03/2016  . Hand dysfunction 03/11/2016  . Symptomatic varicose veins, bilateral 03/11/2016  . Numbness of left thumb 03/11/2016  . Elevated BP 07/27/2014  . Bunion of left foot 07/26/2014  . Abnormality of gait 07/26/2014  . Cough 11/10/2013  . Post-nasal drainage 11/10/2013  . Abnormal urine odor 11/10/2013  . Visit for preventive health examination 07/28/2013  . Right anterior knee pain 07/22/2012  . Medicare annual wellness visit, subsequent 07/25/2011  . ANXIETY, SITUATIONAL 09/25/2009  . ARTHRITIS 09/25/2009  .  Vitamin D deficiency 09/27/2008  . Hyperlipidemia 09/17/2008  . Elevated blood pressure reading without diagnosis of hypertension 09/17/2008  . COLONIC POLYPS, HX OF 09/17/2008  . PERSONAL HISTORY DISEASES SKIN&SUBCUT TISSUE 09/17/2008   Speech Therapy Progress Note  Dates of Reporting Period: 11/22/18 to 01/03/19  Objective: Pt has been seen for 10 speech therapy sessions targeting aphasia and cognitive communication deficits.   See goals, skilled intervention and clinical impressions above for details.  Deneise Lever, [place], CCC-SLP Speech-Language Pathologist  Aliene Altes 01/03/2019, 1:10 PM  [place] [address] [place], [place], [address] Phone: [phone]   Fax:  [phone]   Name: Pamela Alvarez MRN: [id number] Date of Birth: 06/14/1940

## 2019-01-05 ENCOUNTER — Ambulatory Visit: Payer: Medicare Other | Admitting: Occupational Therapy

## 2019-01-05 ENCOUNTER — Encounter: Payer: Self-pay | Admitting: Physical Therapy

## 2019-01-05 ENCOUNTER — Other Ambulatory Visit: Payer: Self-pay

## 2019-01-05 ENCOUNTER — Ambulatory Visit: Payer: Medicare Other | Admitting: Speech Pathology

## 2019-01-05 ENCOUNTER — Ambulatory Visit: Payer: Medicare Other | Admitting: Physical Therapy

## 2019-01-05 ENCOUNTER — Encounter: Payer: Self-pay | Admitting: Speech Pathology

## 2019-01-05 DIAGNOSIS — R41841 Cognitive communication deficit: Secondary | ICD-10-CM

## 2019-01-05 DIAGNOSIS — R41842 Visuospatial deficit: Secondary | ICD-10-CM

## 2019-01-05 DIAGNOSIS — R2681 Unsteadiness on feet: Secondary | ICD-10-CM | POA: Diagnosis not present

## 2019-01-05 DIAGNOSIS — R41840 Attention and concentration deficit: Secondary | ICD-10-CM

## 2019-01-05 DIAGNOSIS — R278 Other lack of coordination: Secondary | ICD-10-CM

## 2019-01-05 DIAGNOSIS — R4701 Aphasia: Secondary | ICD-10-CM

## 2019-01-05 DIAGNOSIS — R41844 Frontal lobe and executive function deficit: Secondary | ICD-10-CM

## 2019-01-05 DIAGNOSIS — R2689 Other abnormalities of gait and mobility: Secondary | ICD-10-CM

## 2019-01-05 DIAGNOSIS — M6281 Muscle weakness (generalized): Secondary | ICD-10-CM

## 2019-01-05 NOTE — Therapy (Signed)
[facility] [address] [place], [place], [address] Phone: [phone]   Fax:  [phone]  Occupational Therapy Treatment  Patient Details  Name: Pamela Alvarez MRN: [id number] Date of Birth: June 09, 1940 No data recorded  Encounter Date: 01/05/2019  OT End of Session - 01/05/19 1110    Visit Number  8    Number of Visits  13    Date for OT Re-Evaluation  01/20/19    Authorization Type  [employer] Medicare, no auth    Authorization - Visit Number  8    Authorization - Number of Visits  10    OT Start Time  1100    OT Stop Time  1145    OT Time Calculation (min)  45 min    Activity Tolerance  Patient tolerated treatment well    Behavior During Therapy  [facility] for tasks assessed/performed       Past Medical History:  Diagnosis Date  . Cyst 07-2008   Cyst on back x2 that were drained  . H/O echocardiogram 02-2000  . Hx of colonic polyps   . Hyperlipidemia   . Hypertension   . Varicose veins of both lower extremities     Past Surgical History:  Procedure Laterality Date  . POLYPECTOMY     Colon  . TONSILLECTOMY    . TUBAL LIGATION      There were no vitals filed for this visit.  Subjective Assessment - 01/05/19 1105    Subjective   Pt could recall DOB w/ difficulty and out of order    Patient is accompanied by:  Family member   husband [name])   Pertinent History  78 y.o female with recent declining cognitive abilities, per MRI -right frontal, right parietal and right cerebellar encephalomalacia and gliosis, possible embolic chronic ischemic infarcts.Pt has hx of ankle fx, hyperlipidemia, and thumb CMC arthritis. Per pt's husband MD is still trying to determine a definative diagnosis, per chart, possible PSP vs. TBI.    Limitations  fall risk    Patient Stated Goals  improved use of L hand, to be as independent as possible    Currently in Pain?  No/denies       Pt stacking blocks by color using gripper (level 1)  w/ Lt hand - pt needed max cues to pick up correctly w/ Lt hand placement on gripper. Pt also would drop block too soon before stacked due to decreased sustained attention. Pt also demo decreased problem solving skills to separate block from others for easier gripping/placement of gripper.  Flipping cards over w/ Lt hand calling out number and suite w/ min cues to continue. Pt often made mistake but would self correct suite 90% of the time.  Pt asked to make PB sandwich - therapist gathered items needed. Pt able to tell one item needed w/ questioning cue. Pt demo decreased sequencing as she opened PB before getting bread out. Pt did not spread PB over bread evenly then placed bread upside down and off center on other piece of bread. When asked to correct, pt turned around but did so touching PB side and getting over fingers. Pt needed cues to wipe PB off hand before placing top back on.  (Pt also cued to wash hands prior to activity and required cues to get soap completely off hands w/ rinsing x 3).  Pt negotiating through tight places w/ HHA and cues to look both directions, look at floor to remove any obstacles, and problem  solve which way is most efficient and safe.                       OT Short Term Goals - 12/29/18 [id number]      OT SHORT TERM GOAL #1   Title  Pt/ caregiver will be I with HEP.    Time  4    Period  Weeks    Status  Achieved    Target Date  12/22/18      OT SHORT TERM GOAL #2   Title  Pt/ husband will verbalize understanding of adapted strategies to maximize safety and independence with ADLs/ IADLs.    Time  4    Period  Weeks    Status  On-going   needs reinforcement/ continued education.     OT SHORT TERM GOAL #3   Title  Pt will demonstrate ability to retrieve a lightweight object at 100 shoulder flexion with LUE.    Time  4    Period  Weeks    Status  Achieved   100     OT SHORT TERM GOAL #4   Title  Pt will demonstrate ability to retrieve a  lightweight object at 115 shoulder flexion with RUE.    Time  4    Period  Weeks    Status  Achieved   120       OT Long Term Goals - 11/22/18 1424      OT LONG TERM GOAL #1   Title  Pt will increase LUE funtional use and reach as eveidenced by increasing LUE box/ blocks to 15 blocks.    Time  8    Period  Weeks    Status  New      OT LONG TERM GOAL #2   Title  Pt will increase LUE grip strength to 25 lbs or greater for increased ease with ADLS.    Time  8    Period  Weeks    Status  New      OT LONG TERM GOAL #3   Title  Pt will demonstrate ability to write her name legibly with good letter size x 3  trials.    Time  8    Period  Weeks    Status  New      OT LONG TERM GOAL #4   Title  Pt will demonstrate ability to retrieve a lightweight object aty 105 shoulder flexion with LUE.    Time  8    Period  Weeks    Status  New            Plan - 01/05/19 1134    Clinical Impression Statement  Pt continues to need cueing to attend to LUE, however improved automatic use of LUE w/ repetition. Pt needs simple familiar tasks to be successful. Pt does demo mental fatigue and makes more mistakes as task goes on    Occupational performance deficits (Please refer to evaluation for details):  ADL's;IADL's;Leisure;Work;Social Participation    Body Structure / Function / Physical Skills  ADL;UE functional use;Balance;Flexibility;Vision;FMC;ROM;Gait;Coordination;GMC;Sensation;Decreased knowledge of precautions;Decreased knowledge of use of DME;IADL;Strength;Dexterity;Mobility    Cognitive Skills  Attention;Learn;Memory;Problem Solve;Safety Awareness;Sequencing;Thought;Understand    Rehab Potential  Fair    OT Frequency  2x / week    OT Duration  8 weeks    OT Treatment/Interventions  Self-care/ADL training;Energy conservation;Visual/perceptual remediation/compensation;Patient/family education;DME and/or AE instruction;Aquatic Therapy;Paraffin;Passive range of motion;Balance  training;Fluidtherapy;Cryotherapy;Building services engineerunctional Mobility Training;Therapeutic activities;Manual Therapy;Therapeutic exercise;Moist Heat;Neuromuscular education;Cognitive  remediation/compensation    Plan  continue simple functional tasks, LUE forced use and attention to Lt side.    Consulted and Agree with Plan of Care  Patient;Family member/caregiver    Family Member Consulted  husband       Patient will benefit from skilled therapeutic intervention in order to improve the following deficits and impairments:   Body Structure / Function / Physical Skills: ADL, UE functional use, Balance, Flexibility, Vision, FMC, ROM, Gait, Coordination, GMC, Sensation, Decreased knowledge of precautions, Decreased knowledge of use of DME, IADL, Strength, Dexterity, Mobility Cognitive Skills: Attention, Learn, Memory, Problem Solve, Safety Awareness, Sequencing, Thought, Understand     Visit Diagnosis: Muscle weakness (generalized)  Unsteadiness on feet  Frontal lobe and executive function deficit  Attention and concentration deficit  Other lack of coordination  Visuospatial deficit    Problem List Patient Active Problem List   Diagnosis Date Noted  . Fluency disorder associated with underlying disease 02/08/2018  . Cognitive and neurobehavioral dysfunction 02/08/2018  . 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 Arthritis of carpometacarpal [facility] joint of left thumb 01/24/2018  . Ankle fracture, left 09/03/2016  . Hand dysfunction 03/11/2016  . Symptomatic varicose veins, bilateral 03/11/2016  . Numbness of left thumb 03/11/2016  . Elevated BP 07/27/2014  . Bunion of left foot 07/26/2014  . Abnormality of gait 07/26/2014  . Cough 11/10/2013  . Post-nasal drainage 11/10/2013  . Abnormal urine odor 11/10/2013  . Visit for preventive health examination 07/28/2013  . Right anterior knee pain 07/22/2012  . Medicare annual wellness visit, subsequent 07/25/2011  . ANXIETY, SITUATIONAL 09/25/2009   . ARTHRITIS 09/25/2009  . Vitamin D deficiency 09/27/2008  . Hyperlipidemia 09/17/2008  . Elevated blood pressure reading without diagnosis of hypertension 09/17/2008  . COLONIC POLYPS, HX OF 09/17/2008  . PERSONAL HISTORY DISEASES SKIN&SUBCUT TISSUE 09/17/2008    Carey Bullocks, OTR/L 01/05/2019, 11:49 AM  [facility] [address] [place], [place], [address] Phone: [phone]   Fax:  [phone]  Name: Manvir Thorson MRN: [id number] Date of Birth: 1940-03-12

## 2019-01-05 NOTE — Patient Instructions (Signed)
   Consider trying thumbs up or thumbs down for yes/no to see if that helps  Consider using the white board to list choices or topics   Try the SFA chart to help with 20 questions - write down her answers to make sure she agrees

## 2019-01-05 NOTE — Therapy (Signed)
[facility] [address] [place], [place], [address] Phone: [phone]   Fax:  [phone]  Speech Language Pathology Treatment  Patient Details  Name: Pamela Alvarez MRN: [id number] Date of Birth: 1941-01-11 Referring Provider (SLP): Dohmeier, Asencion Partridge, MD   Encounter Date: 01/05/2019  End of Session - 01/05/19 1056    Visit Number  11    Number of Visits  17    Date for SLP Re-Evaluation  02/20/19    SLP Start Time  0934    SLP Stop Time   1016    SLP Time Calculation (min)  42 min    Activity Tolerance  Patient tolerated treatment well       Past Medical History:  Diagnosis Date  . Cyst 07-2008   Cyst on back x2 that were drained  . H/O echocardiogram 02-2000  . Hx of colonic polyps   . Hyperlipidemia   . Hypertension   . Varicose veins of both lower extremities     Past Surgical History:  Procedure Laterality Date  . POLYPECTOMY     Colon  . TONSILLECTOMY    . TUBAL LIGATION      There were no vitals filed for this visit.  Subjective Assessment - 01/05/19 1043    Subjective  "She has been doing the Talk Path, I think she does well, but some things are frustrating to her"    Patient is accompained by:  Family member   spouse Harrie Jeans   Currently in Pain?  No/denies            ADULT SLP TREATMENT - 01/05/19 1045      General Information   Behavior/Cognition  Pleasant mood;Cooperative;Alert;Distractible;Requires cueing      Treatment Provided   Treatment provided  Cognitive-Linquistic      Cognitive-Linquistic Treatment   Treatment focused on  Aphasia;Cognition;Patient/family/caregiver education    Skilled Treatment  Continued training spouse and pt in strategies to make communication more efficient and accurate. As pt is mixing up yes/no, we practiced using thumbs up and thumbs down. Spouse encourged to also use this to confirm pt's answer. Pt utilized thumbs up and down with success throught  session (with modeling and verbal cues) to answer personally relevant family and preference questions. Utilized Chartered loss adjuster as compensation for word finding where her grandaughter works - pt generated category with written choice of 2 (Food or Drink) she correctly selected drink. She independently generated "sweet" and "cold" for properties. Given written choice of 2, pt correclty selected location. With written cues of choice of 2, pt utitlized pointing, naming and thumbs up/down to specify the drink was healthy  (properties) and blender (action), to communicate her grandaughter makes smoothies. As we practiced this, I provided instruction and modeling for spouse to [employer] written choices, written topic as well as use SFA questions to facilitate communication at home. Introduced Estate agent some questions or comments for phone calls with grandkids. We came up with 2 questions Chuch could write down for Kathlean to ask them on phone calls.       Assessment / Recommendations / Plan   Plan  Continue with current plan of care      Progression Toward Goals   Progression toward goals  Progressing toward goals       SLP Education - 01/05/19 1053    Education Details  use of SFA, written choices/cues, gesture yes/no; conisder reading focus cards    Person(s) Educated  Patient;Spouse  Methods  Explanation;Demonstration;Verbal cues;Handout    Comprehension  Verbalized understanding;Returned demonstration;Verbal cues required       SLP Short Term Goals - 01/05/19 1056      SLP SHORT TERM GOAL #1   Title  pt will demo ability for functionalality with simple household langauge tasks (e.g., grocery list, errand list, to-do list) over 3 sessions    Time  1    Period  Weeks    Status  Not Met      SLP SHORT TERM GOAL #2   Title  pt will achieve functional communication of wants/needs at home (reported, if needed) over two sessions    Time  1    Period  Weeks    Status  Not Met      SLP  SHORT TERM GOAL #3   Title  pt will demo correct right/left comprehension in simple functional commands 9/10 opportunities    Time  1    Period  Weeks    Status  Not Met      SLP SHORT TERM GOAL #4   Title  pt will complete full aphasia eval before total visit #3    Baseline  form 1 completed 12/06/18    Status  Achieved      SLP SHORT TERM GOAL #5   Title  pt will complete cognitive linguistic eval PRN if deemed clinically necessary    Baseline  started 12/29/18    Time  1    Period  Weeks    Status  Partially Met       SLP Long Term Goals - 01/05/19 1056      SLP LONG TERM GOAL #1   Title  pt will demonstrate improved verbal communication (as rated by pt) compared to when ST began    Time  4    Period  Weeks    Status  On-going      SLP LONG TERM GOAL #2   Title  pt will report speech and language is less frustrating than prior to initiation of ST    Time  4    Period  Weeks    Status  On-going      SLP LONG TERM GOAL #3   Title  pt will produce functional verbal expression in 3 minutes simple conversation over 3 sessions    Time  4    Period  Weeks    Status  On-going      SLP LONG TERM GOAL #4   Title  pt will use multimodal communication PRN to express wants/needs    Time  4    Period  Weeks    Status  On-going      SLP LONG TERM GOAL #5   Title  pt will functionally write her first name independently in 7/10 attempts over 3 sessions    Time  4    Period  Weeks    Status  On-going       Plan - 01/05/19 1055    Clinical Impression Statement  Pt continues to present as an atypical TBI pt or CVA pt with mild-moderate (more moderate) anomic aphasia per Western Aphasia Battery-Revised. Completed Cognitive Linguistic Quick Test today for assessment of cognition. See "skilled intervention" for details on today's session. Per husband, pt working with therapy tasks in [employer] app 30 min daily. SLP recommends cont'd course of skilled ST to address pt's decr'd  language-cogniton ability/possible verbal apraxia.to incr. pt independence    Speech Therapy Frequency  2x / week    Duration  --   8 weeks or 17 visits   Treatment/Interventions  SLP instruction and feedback;Compensatory strategies;Patient/family education;Multimodal communcation approach;Cueing hierarchy;Language facilitation;Functional tasks;Oral motor exercises;Internal/external aids;Environmental controls;Cognitive reorganization    Potential to Achieve Goals  Fair    Potential Considerations  Severity of impairments;Other (comment)       Patient will benefit from skilled therapeutic intervention in order to improve the following deficits and impairments:   Aphasia  Cognitive communication deficit    Problem List Patient Active Problem List   Diagnosis Date Noted  . Fluency disorder associated with underlying disease 02/08/2018  . Cognitive and neurobehavioral dysfunction 02/08/2018  . 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 Arthritis of carpometacarpal [facility]) joint of left thumb 01/24/2018  . Ankle fracture, left 09/03/2016  . Hand dysfunction 03/11/2016  . Symptomatic varicose veins, bilateral 03/11/2016  . Numbness of left thumb 03/11/2016  . Elevated BP 07/27/2014  . Bunion of left foot 07/26/2014  . Abnormality of gait 07/26/2014  . Cough 11/10/2013  . Post-nasal drainage 11/10/2013  . Abnormal urine odor 11/10/2013  . Visit for preventive health examination 07/28/2013  . Right anterior knee pain 07/22/2012  . Medicare annual wellness visit, subsequent 07/25/2011  . ANXIETY, SITUATIONAL 09/25/2009  . ARTHRITIS 09/25/2009  . Vitamin D deficiency 09/27/2008  . Hyperlipidemia 09/17/2008  . Elevated blood pressure reading without diagnosis of hypertension 09/17/2008  . COLONIC POLYPS, HX OF 09/17/2008  . PERSONAL HISTORY DISEASES SKIN&SUBCUT TISSUE 09/17/2008    , Annye Rusk MS, CCC-SLP 01/05/2019, 10:57 AM  [facility] [address] [place], [place], [address] Phone: [phone]   Fax:  [phone]   Name: Pamela Alvarez MRN: [id number] Date of Birth: April 22, 1940

## 2019-01-05 NOTE — Therapy (Signed)
[facility] [address] [place], [place], [address] Phone: [phone]   Fax:  [phone]  Physical Therapy Treatment  Patient Details  Name: Pamela Alvarez MRN: [id number] Date of Birth: 09/28/1940 Referring Provider (PT): Dohmeier, Carmen   Encounter Date: 01/05/2019  PT End of Session - 01/05/19 1336    Visit Number  11    Number of Visits  13    Date for PT Re-Evaluation  01/21/19    Authorization Type  [employer] Medicare-will need 10th visit progress note    PT Start Time  1020    PT Stop Time  1100    PT Time Calculation (min)  40 min    Activity Tolerance  Patient tolerated treatment well    Behavior During Therapy  WFL for tasks assessed/performed       Past Medical History:  Diagnosis Date  . Cyst 07-2008   Cyst on back x2 that were drained  . H/O echocardiogram 02-2000  . Hx of colonic polyps   . Hyperlipidemia   . Hypertension   . Varicose veins of both lower extremities     Past Surgical History:  Procedure Laterality Date  . POLYPECTOMY     Colon  . TONSILLECTOMY    . TUBAL LIGATION      There were no vitals filed for this visit.  Subjective Assessment - 01/05/19 1022    Subjective  Is looking into buying a Pilates reformer and having them come to her house.  Doesnt feel comfortable returning to the gym setting.    Patient is accompained by:  Family member    Patient Stated Goals  Pt's goal for therapy is better balance and walking.    Currently in Pain?  No/denies          [facility] Adult PT Treatment/Exercise - 01/05/19 0001      Transfers   Transfers  Sit to Stand;Stand to Sit;Floor to Transfer    Sit to Stand  4: Min guard    Sit to Stand Details  Verbal cues for sequencing;Verbal cues for technique    Sit to Stand Details (indicate cue type and reason)  continues to push posteriorly at times    Stand to Sit  5: Supervision;With upper extremity assist;To bed;To chair/3-in-1    Stand to  Sit Details (indicate cue type and reason)  Tactile cues for sequencing;Tactile cues for placement;Verbal cues for sequencing;Verbal cues for technique    Stand to Sit Details  Pt not turning completely to chair or reaching back before sitting.  Continued to do this frequently through out session today despite cues.  Husband reports he has noticed this more at home lately and will try to provide her cues.    Floor to Transfer  3: Mod assist;2: Max assist;From elevated surface;With upper extremity assist    Comments  Attempted mat<>floor transfer.  Pt fearful of falling and unable to get pt into tall kneeling from standing at mat.  Switched to having pt crawl onto mat from standing onto hands knees>tall kneeling.  Performed x 3 reps progressing each time to less assistance and decreased fear.  Pt able to perform 10 reps of tall kneeling with UE support on chair going back to sitting on heels then return to tall kneeling.      Ambulation/Gait   Ambulation/Gait  Yes    Ambulation/Gait Assistance  4: Min guard    Ambulation Distance (Feet)  --   In gym to move to   activities   Assistive device  1 person hand held assist    Gait Pattern  Step-through pattern;Decreased step length - right;Decreased step length - left;Shuffle;Decreased dorsiflexion - right;Decreased dorsiflexion - left    Ambulation Surface  Level;Indoor      Posture/Postural Control   Posture/Postural Control  Postural limitations    Postural Limitations  Forward head      Knee/Hip Exercises: Aerobic   Other Aerobic  Scifit level 2.0 all 4 extremities x 4 minutes      Knee/Hip Exercises: Standing   Hip Flexion  Both;2 sets;10 reps;Knee bent;Knee straight    Hip Flexion Limitations  needed bil UE support    Hip Abduction  Both;1 set;15 reps;Knee straight;Stengthening    SLS  10 sec x 2 bil UE support             PT Education - 01/05/19 1335    Education Details  safety with transfers    Person(s) Educated  Patient;Spouse     Methods  Explanation;Demonstration    Comprehension  Verbalized understanding       PT Short Term Goals - 12/29/18 1103      PT SHORT TERM GOAL #1   Title  Pt will perform HEP with family supervision for improved transfers, balance, and gait.  UPDATED TARGET 12/30/2018 ( due to later start with later scheduling)    Time  4    Period  Weeks    Status  Achieved      PT SHORT TERM GOAL #2   Title  Pt will perform 4 of 5 reps of sit<>stand transfers with correct technique, with husband supervision and cues, for improved safety with transfers.    Time  4    Period  Weeks    Status  Achieved      PT SHORT TERM GOAL #3   Title  Pt will improve Berg Balance score to at least 24/56 for decreased fall risk.    Baseline  31/56 12/27/2018    Time  4    Period  Weeks    Status  Achieved      PT SHORT TERM GOAL #4   Title  Pt will improve TUG score to less than or equal to 23 seconds for decreased fall risk.    Baseline  10/22:  25.68 sec, 25.88 sec    Time  4    Period  Weeks    Status  Not Met      PT SHORT TERM GOAL #5   Title  Pt/husband will verbalize understanding of fall prevention in home environment.    Time  4    Period  Weeks    Status  Achieved        PT Long Term Goals - 01/03/19 1319      PT LONG TERM GOAL #1   Title  Pt will perform updated HEP for balance, gait, with husband's supervision.  UPDATED TARGET 01/27/2019    Time  4   PER RECERT 71/08/2692   Period  Weeks    Status  On-going    Target Date  01/27/19      PT LONG TERM GOAL #2   Title  Pt will improve gait velocity to at  least 1.5 ft/sec with appropriate device and supervision, for improved gait efficiency.    Time  4    Period  Weeks    Status  On-going    Target Date  01/27/19      PT  LONG TERM GOAL #3   Title  Pt/husband will verbalize/demonstrate understanding of appropriate floor>stand transfers for safe fall recovery.    Time  4    Period  Weeks    Status  On-going    Target Date   01/27/19      PT LONG TERM GOAL #4   Title  Pt will improve Berg balance score to at least 38/56 for decreased fall risk.    Time  4    Period  Weeks    Status  New    Target Date  01/27/19            Plan - 01/05/19 1336    Clinical Impression Statement  Skilled session focused on transfers and strengthening.  Pt fearful of falling.  Poor safety with transfers today and tries to sit before close enough to chair.  Pt continues to have LLE weakness impacting activities with SLS on L.  Continue PT per POC.    Personal Factors and Comorbidities  Comorbidity 3+    Comorbidities  PMH includes ankle fracture, HTN, hyperlipidemia, recurrent falls    Examination-Activity Limitations  Squat;Stairs;Stand;Transfers;Locomotion Level    Examination-Participation Restrictions  Community Activity    Stability/Clinical Decision Making  Evolving/Moderate complexity    Rehab Potential  Fair   MMSE score 16/30; good family support   PT Frequency  2x / week    PT Duration  4 weeks   per recert 70/01/74, including this week   PT Treatment/Interventions  ADLs/Self Care Home Management;Gait training;DME Instruction;Neuromuscular re-education;Balance training;Therapeutic exercise;Therapeutic activities;Functional mobility training;Patient/family education    PT Next Visit Plan  Balance activities focusing on SLS activities, exercises for psoterior direction stepping, weightshifting, retro walking;  work towards floor transfers    Consulted and Agree with Plan of Care  Patient;Family member/caregiver    Family Member Consulted  Husband       Patient will benefit from skilled therapeutic intervention in order to improve the following deficits and impairments:  Abnormal gait, Difficulty walking, Decreased safety awareness, Decreased balance, Decreased mobility  Visit Diagnosis: Unsteadiness on feet  Other abnormalities of gait and mobility  Muscle weakness (generalized)     Problem List Patient  Active Problem List   Diagnosis Date Noted  . Fluency disorder associated with underlying disease 02/08/2018  . Cognitive and neurobehavioral dysfunction 02/08/2018  . 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 Arthritis of carpometacarpal [facility]) joint of left thumb 01/24/2018  . Ankle fracture, left 09/03/2016  . Hand dysfunction 03/11/2016  . Symptomatic varicose veins, bilateral 03/11/2016  . Numbness of left thumb 03/11/2016  . Elevated BP 07/27/2014  . Bunion of left foot 07/26/2014  . Abnormality of gait 07/26/2014  . Cough 11/10/2013  . Post-nasal drainage 11/10/2013  . Abnormal urine odor 11/10/2013  . Visit for preventive health examination 07/28/2013  . Right anterior knee pain 07/22/2012  . Medicare annual wellness visit, subsequent 07/25/2011  . ANXIETY, SITUATIONAL 09/25/2009  . ARTHRITIS 09/25/2009  . Vitamin D deficiency 09/27/2008  . Hyperlipidemia 09/17/2008  . Elevated blood pressure reading without diagnosis of hypertension 09/17/2008  . COLONIC POLYPS, HX OF 09/17/2008  . PERSONAL HISTORY DISEASES SKIN&SUBCUT TISSUE 09/17/2008    Narda Bonds, PTA [place] 01/05/19 1:44 PM Phone: [phone] Fax: [facility] [address] [place], [place], [address] Phone: [phone]   Fax:  [phone]  Name: Pamela Alvarez MRN: [id number] Date of Birth: [birth year]

## 2019-01-10 ENCOUNTER — Other Ambulatory Visit: Payer: Self-pay

## 2019-01-10 ENCOUNTER — Ambulatory Visit: Payer: Medicare Other | Admitting: Physical Therapy

## 2019-01-10 ENCOUNTER — Encounter: Payer: Self-pay | Admitting: Physical Therapy

## 2019-01-10 ENCOUNTER — Ambulatory Visit: Payer: Medicare Other | Attending: Neurology | Admitting: Occupational Therapy

## 2019-01-10 ENCOUNTER — Encounter: Payer: Self-pay | Admitting: Occupational Therapy

## 2019-01-10 ENCOUNTER — Ambulatory Visit: Payer: Medicare Other | Admitting: Speech Pathology

## 2019-01-10 DIAGNOSIS — R278 Other lack of coordination: Secondary | ICD-10-CM | POA: Diagnosis present

## 2019-01-10 DIAGNOSIS — R2689 Other abnormalities of gait and mobility: Secondary | ICD-10-CM | POA: Insufficient documentation

## 2019-01-10 DIAGNOSIS — M6281 Muscle weakness (generalized): Secondary | ICD-10-CM

## 2019-01-10 DIAGNOSIS — R4701 Aphasia: Secondary | ICD-10-CM | POA: Diagnosis present

## 2019-01-10 DIAGNOSIS — R41840 Attention and concentration deficit: Secondary | ICD-10-CM | POA: Diagnosis present

## 2019-01-10 DIAGNOSIS — R41841 Cognitive communication deficit: Secondary | ICD-10-CM | POA: Diagnosis present

## 2019-01-10 DIAGNOSIS — R41842 Visuospatial deficit: Secondary | ICD-10-CM | POA: Diagnosis present

## 2019-01-10 DIAGNOSIS — R41844 Frontal lobe and executive function deficit: Secondary | ICD-10-CM | POA: Diagnosis present

## 2019-01-10 DIAGNOSIS — R482 Apraxia: Secondary | ICD-10-CM | POA: Insufficient documentation

## 2019-01-10 DIAGNOSIS — R2681 Unsteadiness on feet: Secondary | ICD-10-CM | POA: Diagnosis present

## 2019-01-10 NOTE — Therapy (Signed)
[facility] [address] [place], [place], [address] Phone: [phone]   Fax:  [phone]  Speech Language Pathology Treatment  Patient Details  Name: Pamela Alvarez MRN: [id number] Date of Birth: Mar 02, 1941 Referring Provider (SLP): Dohmeier, Asencion Partridge, MD   Encounter Date: 01/10/2019  End of Session - 01/10/19 1306    Visit Number  12    Number of Visits  17    Date for SLP Re-Evaluation  02/20/19    SLP Start Time  1148    SLP Stop Time   17    SLP Time Calculation (min)  42 min    Activity Tolerance  Patient tolerated treatment well       Past Medical History:  Diagnosis Date  . Cyst 07-2008   Cyst on back x2 that were drained  . H/O echocardiogram 02-2000  . Hx of colonic polyps   . Hyperlipidemia   . Hypertension   . Varicose veins of both lower extremities     Past Surgical History:  Procedure Laterality Date  . POLYPECTOMY     Colon  . TONSILLECTOMY    . TUBAL LIGATION      There were no vitals filed for this visit.  Subjective Assessment - 01/10/19 1149    Subjective  "We took whatever she said," pt, re: previous therapist            ADULT SLP TREATMENT - 01/10/19 1252      General Information   Behavior/Cognition  Pleasant mood;Cooperative;Alert;Distractible;Requires cueing      Treatment Provided   Treatment provided  Cognitive-Linquistic      Cognitive-Linquistic Treatment   Treatment focused on  Aphasia;Cognition;Patient/family/caregiver education    Skilled Treatment  SLP focused session today on using supports for comprehension/expression in conversation with pt. Pt/husband report using thumbs up/down at home to confirm Y/N and that this has been helpful. SLP used this with pt in session today, although on 2 occasions pt gave thumbs up when she meant "no." SLP used written keywords and choices on whiteboard when conversing with pt re: plans for the evening, family members.  Husband reports pt continues to generate more sentences at home and pt responded to SLP in sentences today on several occasions when given extended time. Pt asked SLP 4 questions during conversation today; SLP had to use written keywords and question cues to comprehend one of these questions.        Assessment / Recommendations / Plan   Plan  Continue with current plan of care      Progression Toward Goals   Progression toward goals  Progressing toward goals         SLP Short Term Goals - 01/10/19 1307      SLP SHORT TERM GOAL #1   Title  pt will demo ability for functionalality with simple household langauge tasks (e.g., grocery list, errand list, to-do list) over 3 sessions    Time  1    Period  Weeks    Status  Not Met      SLP SHORT TERM GOAL #2   Title  pt will achieve functional communication of wants/needs at home (reported, if needed) over two sessions    Time  1    Period  Weeks    Status  Not Met      SLP SHORT TERM GOAL #3   Title  pt will demo correct right/left comprehension in simple functional commands 9/10 opportunities    Time  1    Period  Weeks    Status  Not Met      SLP SHORT TERM GOAL #4   Title  pt will complete full aphasia eval before total visit #3    Baseline  form 1 completed 12/06/18    Status  Achieved      SLP SHORT TERM GOAL #5   Title  pt will complete cognitive linguistic eval PRN if deemed clinically necessary    Baseline  started 12/29/18    Time  1    Period  Weeks    Status  Partially Met       SLP Long Term Goals - 01/10/19 1307      SLP LONG TERM GOAL #1   Title  pt will demonstrate improved verbal communication (as rated by pt) compared to when ST began    Time  3    Period  Weeks    Status  On-going      SLP LONG TERM GOAL #2   Title  pt will report speech and language is less frustrating than prior to initiation of ST    Time  3    Period  Weeks    Status  On-going      SLP LONG TERM GOAL #3   Title  pt will  produce functional verbal expression in 3 minutes simple conversation over 3 sessions    Time  3    Period  Weeks    Status  On-going      SLP LONG TERM GOAL #4   Title  pt will use multimodal communication PRN to express wants/needs    Time  3    Period  Weeks    Status  On-going      SLP LONG TERM GOAL #5   Title  pt will functionally write her first name independently in 7/10 attempts over 3 sessions    Time  3    Period  Weeks    Status  On-going       Plan - 01/10/19 1307    Clinical Impression Statement  Pt continues to present as an atypical TBI pt or CVA pt with mild-moderate (more moderate) anomic aphasia per Western Aphasia Battery-Revised. Completed Cognitive Linguistic Quick Test today for assessment of cognition. See "skilled intervention" for details on today's session. Per husband, pt working with therapy tasks in [employer] app 30 min daily. SLP recommends cont'd course of skilled ST to address pt's decr'd language-cogniton ability/possible verbal apraxia.to incr. pt independence    Speech Therapy Frequency  2x / week    Duration  --   8 weeks or 17 visits   Treatment/Interventions  SLP instruction and feedback;Compensatory strategies;Patient/family education;Multimodal communcation approach;Cueing hierarchy;Language facilitation;Functional tasks;Oral motor exercises;Internal/external aids;Environmental controls;Cognitive reorganization    Potential to Achieve Goals  Fair    Potential Considerations  Severity of impairments;Other (comment)       Patient will benefit from skilled therapeutic intervention in order to improve the following deficits and impairments:   Aphasia  Cognitive communication deficit    Problem List Patient Active Problem List   Diagnosis Date Noted  . Fluency disorder associated with underlying disease 02/08/2018  . Cognitive and neurobehavioral dysfunction 02/08/2018  . 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 Arthritis of carpometacarpal  [facility]) joint of left thumb 01/24/2018  . Ankle fracture, left 09/03/2016  . Hand dysfunction 03/11/2016  . Symptomatic varicose veins, bilateral 03/11/2016  . Numbness of left thumb 03/11/2016  . Elevated  BP 07/27/2014  . Bunion of left foot 07/26/2014  . Abnormality of gait 07/26/2014  . Cough 11/10/2013  . Post-nasal drainage 11/10/2013  . Abnormal urine odor 11/10/2013  . Visit for preventive health examination 07/28/2013  . Right anterior knee pain 07/22/2012  . Medicare annual wellness visit, subsequent 07/25/2011  . ANXIETY, SITUATIONAL 09/25/2009  . ARTHRITIS 09/25/2009  . Vitamin D deficiency 09/27/2008  . Hyperlipidemia 09/17/2008  . Elevated blood pressure reading without diagnosis of hypertension 09/17/2008  . COLONIC POLYPS, HX OF 09/17/2008  . PERSONAL HISTORY DISEASES SKIN&SUBCUT TISSUE 09/17/2008   Deneise Lever, [place], CCC-SLP Speech-Language Pathologist   Aliene Altes 01/10/2019, 1:08 PM  [facility] [address] [place], [place], [address] Phone: [phone]   Fax:  [phone]   Name: Pamela Alvarez MRN: [id number] Date of Birth: 10/23/1940

## 2019-01-10 NOTE — Therapy (Signed)
[facility] [address] [place], [place], [address] Phone: [phone]   Fax:  [phone]  Occupational Therapy Treatment  Patient Details  Name: Pamela Alvarez MRN: [id number] Date of Birth: [birth year] No data recorded  Encounter Date: 01/10/2019  OT End of Session - 01/10/19 1055    Visit Number  9    Number of Visits  13    Date for OT Re-Evaluation  01/20/19    Authorization Type  [employer] Medicare, no auth    Authorization - Visit Number  9    Authorization - Number of Visits  10    OT Start Time  1020    OT Stop Time  1100    OT Time Calculation (min)  40 min    Activity Tolerance  Patient tolerated treatment well    Behavior During Therapy  [facility] for tasks assessed/performed       Past Medical History:  Diagnosis Date  . Cyst 07-2008   Cyst on back x2 that were drained  . H/O echocardiogram 02-2000  . Hx of colonic polyps   . Hyperlipidemia   . Hypertension   . Varicose veins of both lower extremities     Past Surgical History:  Procedure Laterality Date  . POLYPECTOMY     Colon  . TONSILLECTOMY    . TUBAL LIGATION      There were no vitals filed for this visit.  Subjective Assessment - 01/10/19 1023    Subjective   Pt could recall DOB w/ difficulty and out of order    Patient is accompanied by:  Family member   husband [name])   Pertinent History  78 y.o female with recent declining cognitive abilities, per MRI -right frontal, right parietal and right cerebellar encephalomalacia and gliosis, possible embolic chronic ischemic infarcts.Pt has hx of ankle fx, hyperlipidemia, and thumb CMC arthritis. Per pt's husband MD is still trying to determine a definative diagnosis, per chart, possible PSP vs. TBI.    Limitations  fall risk    Patient Stated Goals  improved use of L hand, to be as independent as possible    Currently in Pain?  No/denies             Treatment: functional activities to  encourage LUE functional use: placing checkers in connect 4 frame for reaching with cognitive component, then placing small to large pegs in semicircle with LUE, min-mod v.c for LUE functional use. Arm bike x 6 mins level 1 for conditioning  And reciprocal movement Completing 12 piece puzzle for cognitive and visual perceptual skills mod v.c, min A                OT Short Term Goals - 12/29/18 3716      OT SHORT TERM GOAL #1   Title  Pt/ caregiver will be I with HEP.    Time  4    Period  Weeks    Status  Achieved    Target Date  12/22/18      OT SHORT TERM GOAL #2   Title  Pt/ husband will verbalize understanding of adapted strategies to maximize safety and independence with ADLs/ IADLs.    Time  4    Period  Weeks    Status  On-going   needs reinforcement/ continued education.     OT SHORT TERM GOAL #3   Title  Pt will demonstrate ability to retrieve a lightweight object at 100 shoulder flexion with LUE.  Time  4    Period  Weeks    Status  Achieved   100     OT SHORT TERM GOAL #4   Title  Pt will demonstrate ability to retrieve a lightweight object at 115 shoulder flexion with RUE.    Time  4    Period  Weeks    Status  Achieved   120       OT Long Term Goals - 11/22/18 1424      OT LONG TERM GOAL #1   Title  Pt will increase LUE funtional use and reach as eveidenced by increasing LUE box/ blocks to 15 blocks.    Time  8    Period  Weeks    Status  New      OT LONG TERM GOAL #2   Title  Pt will increase LUE grip strength to 25 lbs or greater for increased ease with ADLS.    Time  8    Period  Weeks    Status  New      OT LONG TERM GOAL #3   Title  Pt will demonstrate ability to write her name legibly with good letter size x 3  trials.    Time  8    Period  Weeks    Status  New      OT LONG TERM GOAL #4   Title  Pt will demonstrate ability to retrieve a lightweight object aty 105 shoulder flexion with LUE.    Time  8    Period  Weeks     Status  New            Plan - 01/10/19 1023    Clinical Impression Statement  Pt is progressing towards goals with improving LUE functional use and attention.    Occupational performance deficits (Please refer to evaluation for details):  ADL's;IADL's;Leisure;Work;Social Participation    Body Structure / Function / Physical Skills  ADL;UE functional use;Balance;Flexibility;Vision;FMC;ROM;Gait;Coordination;GMC;Sensation;Decreased knowledge of precautions;Decreased knowledge of use of DME;IADL;Strength;Dexterity;Mobility    Cognitive Skills  Attention;Learn;Memory;Problem Solve;Safety Awareness;Sequencing;Thought;Understand    Rehab Potential  Fair    OT Frequency  2x / week    OT Duration  8 weeks    OT Treatment/Interventions  Self-care/ADL training;Energy conservation;Visual/perceptual remediation/compensation;Patient/family education;DME and/or AE instruction;Aquatic Therapy;Paraffin;Passive range of motion;Balance training;Fluidtherapy;Cryotherapy;Therapist, nutritional;Therapeutic activities;Manual Therapy;Therapeutic exercise;Moist Heat;Neuromuscular education;Cognitive remediation/compensation    Plan  continue simple functional tasks, LUE forced use and attention to Lt side.    Consulted and Agree with Plan of Care  Patient;Family member/caregiver    Family Member Consulted  husband       Patient will benefit from skilled therapeutic intervention in order to improve the following deficits and impairments:   Body Structure / Function / Physical Skills: ADL, UE functional use, Balance, Flexibility, Vision, FMC, ROM, Gait, Coordination, GMC, Sensation, Decreased knowledge of precautions, Decreased knowledge of use of DME, IADL, Strength, Dexterity, Mobility Cognitive Skills: Attention, Learn, Memory, Problem Solve, Safety Awareness, Sequencing, Thought, Understand     Visit Diagnosis: Muscle weakness (generalized)  Frontal lobe and executive function deficit  Attention  and concentration deficit  Other lack of coordination  Visuospatial deficit    Problem List Patient Active Problem List   Diagnosis Date Noted  . Fluency disorder associated with underlying disease 02/08/2018  . Cognitive and neurobehavioral dysfunction 02/08/2018  . 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 Arthritis of carpometacarpal [facility]) joint of left thumb 01/24/2018  . Ankle fracture, left 09/03/2016  . Hand  dysfunction 03/11/2016  . Symptomatic varicose veins, bilateral 03/11/2016  . Numbness of left thumb 03/11/2016  . Elevated BP 07/27/2014  . Bunion of left foot 07/26/2014  . Abnormality of gait 07/26/2014  . Cough 11/10/2013  . Post-nasal drainage 11/10/2013  . Abnormal urine odor 11/10/2013  . Visit for preventive health examination 07/28/2013  . Right anterior knee pain 07/22/2012  . Medicare annual wellness visit, subsequent 07/25/2011  . ANXIETY, SITUATIONAL 09/25/2009  . ARTHRITIS 09/25/2009  . Vitamin D deficiency 09/27/2008  . Hyperlipidemia 09/17/2008  . Elevated blood pressure reading without diagnosis of hypertension 09/17/2008  . COLONIC POLYPS, HX OF 09/17/2008  . PERSONAL HISTORY DISEASES SKIN&SUBCUT TISSUE 09/17/2008    RINE,KATHRYN 01/10/2019, 10:56 AM  [facility] [address] [place], [place], [address] Phone: [phone]   Fax:  [phone]  Name: Pamela ApleySandra Alvarez MRN: [id number] Date of Birth: 05/24/1940

## 2019-01-11 NOTE — Therapy (Signed)
[name] [address] [name], [place], [address] Phone: [phone]   Fax:  [phone]  Physical Therapy Treatment  Patient Details  Name: Pamela Alvarez MRN: [id number] Date of Birth: [birth year] Referring Provider (PT): Dohmeier, Asencion Partridge   Encounter Date: 01/10/2019  PT End of Session - 01/11/19 0828    Visit Number  12    Number of Visits  18   per recert 48/18/5631   Date for PT Re-Evaluation  49/70/26   per recert 37/85/8850   Authorization Type  [place] need 10th visit progress note    PT Start Time  1107    PT Stop Time  1145    PT Time Calculation (min)  38 min    Activity Tolerance  Patient tolerated treatment well    Behavior During Therapy  [facility] for tasks assessed/performed       Past Medical History:  Diagnosis Date  . Cyst 07-2008   Cyst on back x2 that were drained  . H/O echocardiogram 02-2000  . Hx of colonic polyps   . Hyperlipidemia   . Hypertension   . Varicose veins of both lower extremities     Past Surgical History:  Procedure Laterality Date  . POLYPECTOMY     Colon  . TONSILLECTOMY    . TUBAL LIGATION      There were no vitals filed for this visit.  Subjective Assessment - 01/10/19 1108    Subjective  Did have a fall and husband had to get her up. Husband reports pt was able to help him with the floor to stand transfer.    Patient is accompained by:  Family member    Patient Stated Goals  Pt's goal for therapy is better balance and walking.    Currently in Pain?  No/denies                     Therapeutic Activity:  [facility] Adult PT Treatment/Exercise - 01/11/19 0820      Transfers   Transfers  Sit to Stand;Stand to Sit    Sit to Stand  4: Min guard    Sit to Stand Details  Verbal cues for sequencing;Verbal cues for technique    Stand to Sit  5: Supervision;With upper extremity assist;To bed;To chair/3-in-1    Stand to Sit Details (indicate cue type  and reason)  Tactile cues for sequencing;Tactile cues for placement;Verbal cues for sequencing;Verbal cues for technique    Stand to Sit Details  PT provides cue to "PAUSE" upon backing up and feeling seat on legs, prior to locking brakes, reaching back to sit.  Needs cue every time for stand to sit transfer safety.    Comments  TUG shuttle (Sit to stand, then walk 8-10 ft, then stand>sit, then repeat).  Repeated full sequence x 4 reps.  Cues for full turn to sit and correct hand placement each time.      Ambulation/Gait   Ambulation/Gait  Yes    Ambulation/Gait Assistance  4: Min guard    Ambulation/Gait Assistance Details  Husband reports she is up to walking 8 minutes at home with rollator.    Ambulation Distance (Feet)  230 Feet    Assistive device  Rollator    Gait Pattern  Step-through pattern;Decreased step length - right;Decreased step length - left;Shuffle;Decreased dorsiflexion - right;Decreased dorsiflexion - left    Ambulation Surface  Level;Indoor        Neuro Re-education   Standing on solid  surface, with BUE support at stair rails:  Step taps to 6", 12" steps, then step up/up, down/down x 10 reps each; with LLE leading, pt needs verbal and tactile cues to continue to lead with LLE (>50% of the the 10 reps, she does not lead posteriorly back down to the ground with LLE).  With BUE, then minisquats x 10 resp (2 sets), then marching with wide BOS, 2 sets x 10 reps-use of mirror cues for incr step height and increased stance time.  Standing hip strategy, with posterior hip flexion (similar to wall bumps), x 10 reps, with pt needing repeated cues to maintain extended knees during the posterior rocking "hinge" at hips.       PT Short Term Goals - 12/29/18 1103      PT SHORT TERM GOAL #1   Title  Pt will perform HEP with family supervision for improved transfers, balance, and gait.  UPDATED TARGET 12/30/2018 ( due to later start with later scheduling)    Time  4    Period   Weeks    Status  Achieved      PT SHORT TERM GOAL #2   Title  Pt will perform 4 of 5 reps of sit<>stand transfers with correct technique, with husband supervision and cues, for improved safety with transfers.    Time  4    Period  Weeks    Status  Achieved      PT SHORT TERM GOAL #3   Title  Pt will improve Berg Balance score to at least 24/56 for decreased fall risk.    Baseline  31/56 12/27/2018    Time  4    Period  Weeks    Status  Achieved      PT SHORT TERM GOAL #4   Title  Pt will improve TUG score to less than or equal to 23 seconds for decreased fall risk.    Baseline  10/22:  25.68 sec, 25.88 sec    Time  4    Period  Weeks    Status  Not Met      PT SHORT TERM GOAL #5   Title  Pt/husband will verbalize understanding of fall prevention in home environment.    Time  4    Period  Weeks    Status  Achieved        PT Long Term Goals - 01/03/19 1319      PT LONG TERM GOAL #1   Title  Pt will perform updated HEP for balance, gait, with husband's supervision.  UPDATED TARGET 01/27/2019    Time  4   PER RECERT 82/70/7867   Period  Weeks    Status  On-going    Target Date  01/27/19      PT LONG TERM GOAL #2   Title  Pt will improve gait velocity to at  least 1.5 ft/sec with appropriate device and supervision, for improved gait efficiency.    Time  4    Period  Weeks    Status  On-going    Target Date  01/27/19      PT LONG TERM GOAL #3   Title  Pt/husband will verbalize/demonstrate understanding of appropriate floor>stand transfers for safe fall recovery.    Time  4    Period  Weeks    Status  On-going    Target Date  01/27/19      PT LONG TERM GOAL #4   Title  Pt will improve Berg balance  score to at least 38/56 for decreased fall risk.    Time  4    Period  Weeks    Status  New    Target Date  01/27/19            Plan - 01/11/19 8341    Clinical Impression Statement  Continued to address transfer training, with addition of short distance gait  and turns, as well as standing exercises to address hip/knee flexion (for improved flexibility for floor to stand transfer).  Pt declines to work on floor to stand transfer today, but pt does report than in fall last week, she was able to help with getting up from the floor, after simulated practice here in clinic last week.    Personal Factors and Comorbidities  Comorbidity 3+    Comorbidities  PMH includes ankle fracture, HTN, hyperlipidemia, recurrent falls    Examination-Activity Limitations  Squat;Stairs;Stand;Transfers;Locomotion Level    Examination-Participation Restrictions  Community Activity    Stability/Clinical Decision Making  Evolving/Moderate complexity    Rehab Potential  Fair   MMSE score 16/30; good family support   PT Frequency  2x / week    PT Duration  4 weeks   per recert 96/22/29, including this week   PT Treatment/Interventions  ADLs/Self Care Home Management;Gait training;DME Instruction;Neuromuscular re-education;Balance training;Therapeutic exercise;Therapeutic activities;Functional mobility training;Patient/family education    PT Next Visit Plan  Balance activities focusing on SLS activities as well as lower extremity flexibity, exercises for psoterior direction stepping, weightshifting, retro walking;  work towards floor transfers   Please note: week 2 of 4 in POC (wk of 01/10/2019); pt scheduled further out   Consulted and Agree with Plan of Care  Patient;Family member/caregiver    Family Member Consulted  Husband       Patient will benefit from skilled therapeutic intervention in order to improve the following deficits and impairments:  Abnormal gait, Difficulty walking, Decreased safety awareness, Decreased balance, Decreased mobility  Visit Diagnosis: Unsteadiness on feet  Other abnormalities of gait and mobility  Muscle weakness (generalized)     Problem List Patient Active Problem List   Diagnosis Date Noted  . Fluency disorder associated with  underlying disease 02/08/2018  . Cognitive and neurobehavioral dysfunction 02/08/2018  . 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 Arthritis of carpometacarpal [facility]) joint of left thumb 01/24/2018  . Ankle fracture, left 09/03/2016  . Hand dysfunction 03/11/2016  . Symptomatic varicose veins, bilateral 03/11/2016  . Numbness of left thumb 03/11/2016  . Elevated BP 07/27/2014  . Bunion of left foot 07/26/2014  . Abnormality of gait 07/26/2014  . Cough 11/10/2013  . Post-nasal drainage 11/10/2013  . Abnormal urine odor 11/10/2013  . Visit for preventive health examination 07/28/2013  . Right anterior knee pain 07/22/2012  . Medicare annual wellness visit, subsequent 07/25/2011  . ANXIETY, SITUATIONAL 09/25/2009  . ARTHRITIS 09/25/2009  . Vitamin D deficiency 09/27/2008  . Hyperlipidemia 09/17/2008  . Elevated blood pressure reading without diagnosis of hypertension 09/17/2008  . COLONIC POLYPS, HX OF 09/17/2008  . PERSONAL HISTORY DISEASES SKIN&SUBCUT TISSUE 09/17/2008    , W. 01/11/2019, 8:38 AM  Frazier Butt., PT   [facility] [address] [place], [place], [address] Phone: [phone]   Fax:  [phone]  Name: Pamela Alvarez MRN: [id number] Date of Birth: February 10, 1941

## 2019-01-13 ENCOUNTER — Ambulatory Visit: Payer: Medicare Other | Admitting: Physical Therapy

## 2019-01-13 ENCOUNTER — Ambulatory Visit: Payer: Medicare Other | Admitting: Occupational Therapy

## 2019-01-13 ENCOUNTER — Ambulatory Visit: Payer: Medicare Other

## 2019-01-13 ENCOUNTER — Encounter: Payer: Self-pay | Admitting: Physical Therapy

## 2019-01-13 ENCOUNTER — Other Ambulatory Visit: Payer: Self-pay

## 2019-01-13 DIAGNOSIS — R278 Other lack of coordination: Secondary | ICD-10-CM

## 2019-01-13 DIAGNOSIS — R2681 Unsteadiness on feet: Secondary | ICD-10-CM

## 2019-01-13 DIAGNOSIS — M6281 Muscle weakness (generalized): Secondary | ICD-10-CM

## 2019-01-13 DIAGNOSIS — R4701 Aphasia: Secondary | ICD-10-CM

## 2019-01-13 DIAGNOSIS — R41840 Attention and concentration deficit: Secondary | ICD-10-CM

## 2019-01-13 DIAGNOSIS — R41844 Frontal lobe and executive function deficit: Secondary | ICD-10-CM

## 2019-01-13 DIAGNOSIS — R41841 Cognitive communication deficit: Secondary | ICD-10-CM

## 2019-01-13 DIAGNOSIS — R2689 Other abnormalities of gait and mobility: Secondary | ICD-10-CM

## 2019-01-13 NOTE — Therapy (Signed)
[facility] [address] [place], [place], [address] Phone: [phone]   Fax:  [phone]  Speech Language Pathology Treatment  Patient Details  Name: Pamela Alvarez MRN: [id number] Date of Birth: [birth year] Referring Provider (SLP): Dohmeier, Asencion Partridge, MD   Encounter Date: 01/13/2019  End of Session - 01/13/19 1344    Visit Number  13    Number of Visits  17    Date for SLP Re-Evaluation  02/20/19    SLP Start Time  1103    SLP Stop Time   1145    SLP Time Calculation (min)  42 min    Activity Tolerance  Patient tolerated treatment well       Past Medical History:  Diagnosis Date  . Cyst 07-2008   Cyst on back x2 that were drained  . H/O echocardiogram 02-2000  . Hx of colonic polyps   . Hyperlipidemia   . Hypertension   . Varicose veins of both lower extremities     Past Surgical History:  Procedure Laterality Date  . POLYPECTOMY     Colon  . TONSILLECTOMY    . TUBAL LIGATION      There were no vitals filed for this visit.  Subjective Assessment - 01/13/19 1108    Subjective  "Sentence completion and flashcard naming." (husbnad re: what pt has done with talkPath therapy)    Patient is accompained by:  Family member   husband   Currently in Pain?  No/denies            ADULT SLP TREATMENT - 01/13/19 1110      General Information   Behavior/Cognition  Alert;Cooperative;Pleasant mood      Treatment Provided   Treatment provided  Cognitive-Linquistic      Cognitive-Linquistic Treatment   Treatment focused on  Aphasia;Patient/family/caregiver education    Skilled Treatment  Pt is doing talkpath therapy consistently. SLP reviewed thumbs up/down for yes/no and pt stated "we - do that." Pt's husband reports they are not using written cues at home (like SLP has done in therapy) for assistance, mostly because pt's speech is better than prior. Pt brought her notebook with pictures so SLP engaged pt in  conversation using the pictures for support. Pt produced sentence or phrase responses to SLP questions/comments functionally 65% of the time and 80% of the time with rare min phonemic cues. SLP highlighted to pt/husband that pt verbal expression appears to be improved with picture support.      Assessment / Recommendations / Plan   Plan  Continue with current plan of care      Progression Toward Goals   Progression toward goals  Progressing toward goals         SLP Short Term Goals - 01/10/19 1307      SLP SHORT TERM GOAL #1   Title  pt will demo ability for functionalality with simple household langauge tasks (e.g., grocery list, errand list, to-do list) over 3 sessions    Time  1    Period  Weeks    Status  Not Met      SLP SHORT TERM GOAL #2   Title  pt will achieve functional communication of wants/needs at home (reported, if needed) over two sessions    Time  1    Period  Weeks    Status  Not Met      SLP SHORT TERM GOAL #3   Title  pt will demo correct right/left comprehension in simple  functional commands 9/10 opportunities    Time  1    Period  Weeks    Status  Not Met      SLP SHORT TERM GOAL #4   Title  pt will complete full aphasia eval before total visit #3    Baseline  form 1 completed 12/06/18    Status  Achieved      SLP SHORT TERM GOAL #5   Title  pt will complete cognitive linguistic eval PRN if deemed clinically necessary    Baseline  started 12/29/18    Time  1    Period  Weeks    Status  Partially Met       SLP Long Term Goals - 01/13/19 1345      SLP LONG TERM GOAL #1   Title  pt will demonstrate improved verbal communication (as rated by pt) compared to when ST began    Period  Weeks    Status  Achieved      SLP LONG TERM GOAL #2   Title  pt will report speech and language is less frustrating than prior to initiation of ST    Time  3    Period  Weeks    Status  On-going      SLP LONG TERM GOAL #3   Title  pt will produce functional  verbal expression in 3 minutes simple conversation over 3 sessions    Time  3    Period  Weeks    Status  On-going      SLP LONG TERM GOAL #4   Title  pt will use multimodal communication PRN to express wants/needs    Time  3    Period  Weeks    Status  On-going      SLP LONG TERM GOAL #5   Title  pt will functionally write her first name independently in 7/10 attempts over 3 sessions    Time  3    Period  Weeks    Status  On-going       Plan - 01/13/19 1344    Clinical Impression Statement  Pt continues to present as an atypical TBI pt or CVA pt with mild-moderate (more moderate) anomic aphasia per Western Aphasia Battery-Revised. Completed Cognitive Linguistic Quick Test today for assessment of cognition. See "skilled intervention" for details on today's session. Per husband, pt working with therapy tasks in [employer] app 30 min daily. SLP recommends cont'd course of skilled ST to address pt's decr'd language-cogniton ability/possible verbal apraxia.to incr. pt independence    Speech Therapy Frequency  2x / week    Duration  --   8 weeks or 17 visits   Treatment/Interventions  SLP instruction and feedback;Compensatory strategies;Patient/family education;Multimodal communcation approach;Cueing hierarchy;Language facilitation;Functional tasks;Oral motor exercises;Internal/external aids;Environmental controls;Cognitive reorganization    Potential to Achieve Goals  Fair    Potential Considerations  Severity of impairments;Other (comment)       Patient will benefit from skilled therapeutic intervention in order to improve the following deficits and impairments:   Aphasia  Cognitive communication deficit    Problem List Patient Active Problem List   Diagnosis Date Noted  . Fluency disorder associated with underlying disease 02/08/2018  . Cognitive and neurobehavioral dysfunction 02/08/2018  . 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 Arthritis of carpometacarpal [facility]) joint of  left thumb 01/24/2018  . Ankle fracture, left 09/03/2016  . Hand dysfunction 03/11/2016  . Symptomatic varicose veins, bilateral 03/11/2016  . Numbness of left thumb 03/11/2016  .  Elevated BP 07/27/2014  . Bunion of left foot 07/26/2014  . Abnormality of gait 07/26/2014  . Cough 11/10/2013  . Post-nasal drainage 11/10/2013  . Abnormal urine odor 11/10/2013  . Visit for preventive health examination 07/28/2013  . Right anterior knee pain 07/22/2012  . Medicare annual wellness visit, subsequent 07/25/2011  . ANXIETY, SITUATIONAL 09/25/2009  . ARTHRITIS 09/25/2009  . Vitamin D deficiency 09/27/2008  . Hyperlipidemia 09/17/2008  . Elevated blood pressure reading without diagnosis of hypertension 09/17/2008  . COLONIC POLYPS, HX OF 09/17/2008  . PERSONAL HISTORY DISEASES SKIN&SUBCUT TISSUE 09/17/2008    [facility] ,MS, CCC-SLP  01/13/2019, 1:45 PM  [facility] [address] [place], [place], [address] Phone: [phone]   Fax:  [phone]   Name: Pamela Alvarez MRN: [id number] Date of Birth: [birth year]

## 2019-01-15 ENCOUNTER — Encounter: Payer: Self-pay | Admitting: Physical Therapy

## 2019-01-15 NOTE — Therapy (Signed)
[facility] [address] [place], [place], [address] Phone: [phone]   Fax:  [phone]  Physical Therapy Treatment  Patient Details  Name: Pamela Alvarez MRN: [id number] Date of Birth: Dec 26, 1940 Referring Provider (PT): Dohmeier, Asencion Partridge   Encounter Date: 01/13/2019  PT End of Session - 01/15/19 1541    Visit Number  13    Number of Visits  18   per recert 54/27/0623   Date for PT Re-Evaluation  76/28/31   per recert 51/76/1607   Authorization Type  [employer] Medicare-will need 10th visit progress note    PT Start Time  1021    PT Stop Time  1100    PT Time Calculation (min)  39 min    Activity Tolerance  Patient tolerated treatment well    Behavior During Therapy  [facility] for tasks assessed/performed       Past Medical History:  Diagnosis Date  . Cyst 07-2008   Cyst on back x2 that were drained  . H/O echocardiogram 02-2000  . Hx of colonic polyps   . Hyperlipidemia   . Hypertension   . Varicose veins of both lower extremities     Past Surgical History:  Procedure Laterality Date  . POLYPECTOMY     Colon  . TONSILLECTOMY    . TUBAL LIGATION      There were no vitals filed for this visit.  Subjective Assessment - 01/15/19 1534    Subjective  Nothing new today.    Patient is accompained by:  Family member    Patient Stated Goals  Pt's goal for therapy is better balance and walking.    Currently in Pain?  No/denies                       [facility] Adult PT Treatment/Exercise - 01/15/19 1535      Transfers   Transfers  Sit to Stand;Stand to Sit    Sit to Stand  4: Min guard    Sit to Stand Details  Verbal cues for sequencing;Verbal cues for technique    Stand to Sit  5: Supervision;With upper extremity assist;To bed;To chair/3-in-1    Stand to Sit Details (indicate cue type and reason)  Tactile cues for sequencing;Tactile cues for placement;Verbal cues for sequencing;Verbal cues for technique     Number of Reps  --   At least 5 reps throughout session     Ambulation/Gait   Ambulation/Gait  Yes    Ambulation/Gait Assistance  4: Min guard    Ambulation Distance (Feet)  400 Feet   230   Assistive device  Rollator    Gait Pattern  Step-through pattern;Decreased step length - right;Decreased step length - left;Shuffle;Decreased dorsiflexion - right;Decreased dorsiflexion - left    Ambulation Surface  Level;Indoor    Gait Comments  Practiced short distance walking, turn, and back up fully to sit.  Pt still has tendency to let go of rollator and sit prior to reaching iwth her hands to mat/chair surfaces.      High Level Balance   High Level Balance Activities  --      Neuro Re-ed    Neuro Re-ed Details   Facing mat surface, performed PWR! Moves in modified quadruped (to work on trunk flexion and try to progress to 4-point on mat):  PWR! Up x 5 reps (lean into mat, then push away with upright posture); PWR! Rock forward/back x 5 reps with cues at hips for improved  excursion in forward/back direction; PWR! Step x 3 reps each leg, for improved forward step (encouraging trunk, lower extremity flexion).  Pt declines attempts at four-point quadruped on mat.          Balance Exercises - 01/15/19 1545      Balance Exercises: Standing   Balance Beam  Standing on balance beam with feet shoulder width apart:  forward step taps x 10 reps, 2 sets to encourage anterior excursion of lower leg over ankle (to simulate for improved forward lean with sit to stand)    Retro Gait  Upper extremity support;3 reps   Forward/back in bars; cues for increased step length   Marching Limitations  Forward marching parallel bars, 3 reps x 10    Other Standing Exercises  Single limb stance, tapping to balance discs, single taps x 10 reps each leg.  Attempted double taps, but pt does not perform.          PT Short Term Goals - 12/29/18 1103      PT SHORT TERM GOAL #1   Title  Pt will perform HEP with  family supervision for improved transfers, balance, and gait.  UPDATED TARGET 12/30/2018 ( due to later start with later scheduling)    Time  4    Period  Weeks    Status  Achieved      PT SHORT TERM GOAL #2   Title  Pt will perform 4 of 5 reps of sit<>stand transfers with correct technique, with husband supervision and cues, for improved safety with transfers.    Time  4    Period  Weeks    Status  Achieved      PT SHORT TERM GOAL #3   Title  Pt will improve Berg Balance score to at least 24/56 for decreased fall risk.    Baseline  31/56 12/27/2018    Time  4    Period  Weeks    Status  Achieved      PT SHORT TERM GOAL #4   Title  Pt will improve TUG score to less than or equal to 23 seconds for decreased fall risk.    Baseline  10/22:  25.68 sec, 25.88 sec    Time  4    Period  Weeks    Status  Not Met      PT SHORT TERM GOAL #5   Title  Pt/husband will verbalize understanding of fall prevention in home environment.    Time  4    Period  Weeks    Status  Achieved        PT Long Term Goals - 01/03/19 1319      PT LONG TERM GOAL #1   Title  Pt will perform updated HEP for balance, gait, with husband's supervision.  UPDATED TARGET 01/27/2019    Time  4   PER RECERT 12/05/5745   Period  Weeks    Status  On-going    Target Date  01/27/19      PT LONG TERM GOAL #2   Title  Pt will improve gait velocity to at  least 1.5 ft/sec with appropriate device and supervision, for improved gait efficiency.    Time  4    Period  Weeks    Status  On-going    Target Date  01/27/19      PT LONG TERM GOAL #3   Title  Pt/husband will verbalize/demonstrate understanding of appropriate floor>stand transfers for safe fall recovery.  Time  4    Period  Weeks    Status  On-going    Target Date  01/27/19      PT LONG TERM GOAL #4   Title  Pt will improve Berg balance score to at least 38/56 for decreased fall risk.    Time  4    Period  Weeks    Status  New    Target Date   01/27/19            Plan - 01/15/19 1541    Clinical Impression Statement  Varied distances of gait today with rollator, focused on transition backwards with transfers to prepare to sit; pt still needs cues and min guard for safety, as she does not reach back for sitting surface.  She does appear to have slightly improved confidence in retro walking in parallel bars and with rollator.  Still hesitant for SLS on LLE and attempts at quadruped activity on mat.    Personal Factors and Comorbidities  Comorbidity 3+    Comorbidities  PMH includes ankle fracture, HTN, hyperlipidemia, recurrent falls    Examination-Activity Limitations  Squat;Stairs;Stand;Transfers;Locomotion Level    Examination-Participation Restrictions  Community Activity    Stability/Clinical Decision Making  Evolving/Moderate complexity    Rehab Potential  Fair   MMSE score 16/30; good family support   PT Frequency  2x / week    PT Duration  4 weeks   per recert 71/06/26, including this week   PT Treatment/Interventions  ADLs/Self Care Home Management;Gait training;DME Instruction;Neuromuscular re-education;Balance training;Therapeutic exercise;Therapeutic activities;Functional mobility training;Patient/family education    PT Next Visit Plan  Work torwards LTGs; discuss POC/apptsBalance activities focusing on SLS activities as well as lower extremity flexibity, exercises for psoterior direction stepping, weightshifting, retro walking;  work towards floor transfers   Please note: week 2 of 4 in POC (wk of 01/10/2019); pt scheduled further out   Consulted and Agree with Plan of Care  Patient;Family member/caregiver    Family Member Consulted  Husband       Patient will benefit from skilled therapeutic intervention in order to improve the following deficits and impairments:  Abnormal gait, Difficulty walking, Decreased safety awareness, Decreased balance, Decreased mobility  Visit Diagnosis: Other abnormalities of gait and  mobility  Unsteadiness on feet  Muscle weakness (generalized)     Problem List Patient Active Problem List   Diagnosis Date Noted  . Fluency disorder associated with underlying disease 02/08/2018  . Cognitive and neurobehavioral dysfunction 02/08/2018  . 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 Arthritis of carpometacarpal [facility]) joint of left thumb 01/24/2018  . Ankle fracture, left 09/03/2016  . Hand dysfunction 03/11/2016  . Symptomatic varicose veins, bilateral 03/11/2016  . Numbness of left thumb 03/11/2016  . Elevated BP 07/27/2014  . Bunion of left foot 07/26/2014  . Abnormality of gait 07/26/2014  . Cough 11/10/2013  . Post-nasal drainage 11/10/2013  . Abnormal urine odor 11/10/2013  . Visit for preventive health examination 07/28/2013  . Right anterior knee pain 07/22/2012  . Medicare annual wellness visit, subsequent 07/25/2011  . ANXIETY, SITUATIONAL 09/25/2009  . ARTHRITIS 09/25/2009  . Vitamin D deficiency 09/27/2008  . Hyperlipidemia 09/17/2008  . Elevated blood pressure reading without diagnosis of hypertension 09/17/2008  . COLONIC POLYPS, HX OF 09/17/2008  . PERSONAL HISTORY DISEASES SKIN&SUBCUT TISSUE 09/17/2008    Devondre Guzzetta W. 01/15/2019, 3:45 PM  Mady Haagensen, PT 01/15/19 3:46 PM Phone: [phone] Fax: [facility]  [place], [place], [address] Phone: [phone]   Fax:  [phone]  Name: Pamela Alvarez MRN: [id number] Date of Birth: 05-24-1940

## 2019-01-15 NOTE — Therapy (Signed)
[facility] [address] [place], [place], [address] Phone: [phone]   Fax:  [phone]  Occupational Therapy Treatment  Patient Details  Name: Pamela Alvarez MRN: [id number] Date of Birth: 1940-08-31 No data recorded  Encounter Date: 01/13/2019  OT End of Session - 01/15/19 1651    Visit Number  10    Number of Visits  13    Date for OT Re-Evaluation  01/20/19    Authorization Type  [employer] Medicare    Authorization - Visit Number  10    Authorization - Number of Visits  10    OT Start Time  0940    OT Stop Time  1015    OT Time Calculation (min)  35 min    Activity Tolerance  Patient tolerated treatment well    Behavior During Therapy  [facility] for tasks assessed/performed       Past Medical History:  Diagnosis Date  . Cyst 07-2008   Cyst on back x2 that were drained  . H/O echocardiogram 02-2000  . Hx of colonic polyps   . Hyperlipidemia   . Hypertension   . Varicose veins of both lower extremities     Past Surgical History:  Procedure Laterality Date  . POLYPECTOMY     Colon  . TONSILLECTOMY    . TUBAL LIGATION      There were no vitals filed for this visit.  Subjective Assessment - 01/15/19 1651    Currently in Pain?  No/denies                  Treatment: Cane exercises for shoulder flexion abduction and chest press min v.c Arm bike x 5 mins level 1 for conditioning.  standing to place graded clothespins on vertical antennae with LUE, mod vc and close supervision Completing 12 piece puzzle with bilateral UE's mod v.c for performance.           OT Short Term Goals - 12/29/18 8676      OT SHORT TERM GOAL #1   Title  Pt/ caregiver will be I with HEP.    Time  4    Period  Weeks    Status  Achieved    Target Date  12/22/18      OT SHORT TERM GOAL #2   Title  Pt/ husband will verbalize understanding of adapted strategies to maximize safety and independence with ADLs/ IADLs.    Time  4    Period  Weeks    Status  On-going   needs reinforcement/ continued education.     OT SHORT TERM GOAL #3   Title  Pt will demonstrate ability to retrieve a lightweight object at 100 shoulder flexion with LUE.    Time  4    Period  Weeks    Status  Achieved   100     OT SHORT TERM GOAL #4   Title  Pt will demonstrate ability to retrieve a lightweight object at 115 shoulder flexion with RUE.    Time  4    Period  Weeks    Status  Achieved   120       OT Long Term Goals - 01/15/19 1653      OT LONG TERM GOAL #1   Title  Pt will increase LUE funtional use and reach as eveidenced by increasing LUE box/ blocks to 15 blocks.    Time  8    Period  Weeks    Status  New      OT LONG TERM GOAL #2   Title  Pt will increase LUE grip strength to 25 lbs or greater for increased ease with ADLS.    Time  8    Period  Weeks    Status  New      OT LONG TERM GOAL #3   Title  Pt will demonstrate ability to write her name legibly with good letter size x 3  trials.    Time  8    Period  Weeks    Status  New      OT LONG TERM GOAL #4   Title  Pt will demonstrate ability to retrieve a lightweight object aty 105 shoulder flexion with LUE.    Time  8    Period  Weeks    Status  New            Plan - 01/15/19 1653    Clinical Impression Statement  Pt is progressing towards goals with improving LUE functional use and attention. Pt has achieved short term goals and she is progressing towards Long term goals for the reporting period of 11/22/2018-01/13/2019    Occupational performance deficits (Please refer to evaluation for details):  ADL's;IADL's;Leisure;Work;Social Participation    Body Structure / Function / Physical Skills  ADL;UE functional use;Balance;Flexibility;Vision;FMC;ROM;Gait;Coordination;GMC;Sensation;Decreased knowledge of precautions;Decreased knowledge of use of DME;IADL;Strength;Dexterity;Mobility    Cognitive Skills  Attention;Learn;Memory;Problem Solve;Safety  Awareness;Sequencing;Thought;Understand    Rehab Potential  Fair    OT Frequency  2x / week    OT Duration  8 weeks    OT Treatment/Interventions  Self-care/ADL training;Energy conservation;Visual/perceptual remediation/compensation;Patient/family education;DME and/or AE instruction;Aquatic Therapy;Paraffin;Passive range of motion;Balance training;Fluidtherapy;Cryotherapy;Building services engineerunctional Mobility Training;Therapeutic activities;Manual Therapy;Therapeutic exercise;Moist Heat;Neuromuscular education;Cognitive remediation/compensation    Plan  check goals and renew next week,  simple functional tasks, LUE forced use and attention to Lt side.    Consulted and Agree with Plan of Care  Patient;Family member/caregiver    Family Member Consulted  husband       Patient will benefit from skilled therapeutic intervention in order to improve the following deficits and impairments:   Body Structure / Function / Physical Skills: ADL, UE functional use, Balance, Flexibility, Vision, FMC, ROM, Gait, Coordination, GMC, Sensation, Decreased knowledge of precautions, Decreased knowledge of use of DME, IADL, Strength, Dexterity, Mobility Cognitive Skills: Attention, Learn, Memory, Problem Solve, Safety Awareness, Sequencing, Thought, Understand     Visit Diagnosis: No diagnosis found.    Problem List Patient Active Problem List   Diagnosis Date Noted  . Fluency disorder associated with underlying disease 02/08/2018  . Cognitive and neurobehavioral dysfunction 02/08/2018  . 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 Arthritis of carpometacarpal [facility]) joint of left thumb 01/24/2018  . Ankle fracture, left 09/03/2016  . Hand dysfunction 03/11/2016  . Symptomatic varicose veins, bilateral 03/11/2016  . Numbness of left thumb 03/11/2016  . Elevated BP 07/27/2014  . Bunion of left foot 07/26/2014  . Abnormality of gait 07/26/2014  . Cough 11/10/2013  . Post-nasal drainage 11/10/2013  . Abnormal urine odor  11/10/2013  . Visit for preventive health examination 07/28/2013  . Right anterior knee pain 07/22/2012  . Medicare annual wellness visit, subsequent 07/25/2011  . ANXIETY, SITUATIONAL 09/25/2009  . ARTHRITIS 09/25/2009  . Vitamin D deficiency 09/27/2008  . Hyperlipidemia 09/17/2008  . Elevated blood pressure reading without diagnosis of hypertension 09/17/2008  . COLONIC POLYPS, HX OF 09/17/2008  . PERSONAL HISTORY DISEASES SKIN&SUBCUT TISSUE 09/17/2008    RINE,KATHRYN 01/15/2019, 4:57  PM Theone Murdoch, OTR/L [phone] Phone: [phone] 5:01 PM 01/15/19 [facility] [address] [place], [place], [address] Phone: [phone]   Fax:  [phone]  Name: Pamela Alvarez MRN: [id number] Date of Birth: 1940/08/30

## 2019-01-19 ENCOUNTER — Other Ambulatory Visit: Payer: Self-pay

## 2019-01-19 ENCOUNTER — Ambulatory Visit: Payer: Medicare Other | Admitting: Occupational Therapy

## 2019-01-19 ENCOUNTER — Ambulatory Visit: Payer: Medicare Other | Admitting: Physical Therapy

## 2019-01-19 ENCOUNTER — Encounter: Payer: Self-pay | Admitting: Physical Therapy

## 2019-01-19 ENCOUNTER — Ambulatory Visit: Payer: Medicare Other | Admitting: Speech Pathology

## 2019-01-19 DIAGNOSIS — R41840 Attention and concentration deficit: Secondary | ICD-10-CM

## 2019-01-19 DIAGNOSIS — R278 Other lack of coordination: Secondary | ICD-10-CM

## 2019-01-19 DIAGNOSIS — R482 Apraxia: Secondary | ICD-10-CM

## 2019-01-19 DIAGNOSIS — M6281 Muscle weakness (generalized): Secondary | ICD-10-CM

## 2019-01-19 DIAGNOSIS — R41841 Cognitive communication deficit: Secondary | ICD-10-CM

## 2019-01-19 DIAGNOSIS — R4701 Aphasia: Secondary | ICD-10-CM

## 2019-01-19 DIAGNOSIS — R41842 Visuospatial deficit: Secondary | ICD-10-CM

## 2019-01-19 DIAGNOSIS — R2689 Other abnormalities of gait and mobility: Secondary | ICD-10-CM

## 2019-01-19 DIAGNOSIS — R41844 Frontal lobe and executive function deficit: Secondary | ICD-10-CM

## 2019-01-19 DIAGNOSIS — R2681 Unsteadiness on feet: Secondary | ICD-10-CM

## 2019-01-19 NOTE — Therapy (Signed)
[facility] [address] [place], [place], [address] Phone: [phone]   Fax:  [phone]  Occupational Therapy Treatment  Patient Details  Name: Pamela Alvarez MRN: [id number] Date of Birth: 25-Jul-1940 No data recorded  Encounter Date: 01/19/2019  OT End of Session - 01/19/19 1239    Visit Number  11    Number of Visits  21    Date for OT Re-Evaluation  02/25/19    Authorization Type  [employer]HC Medicare    Authorization - Visit Number  11    Authorization - Number of Visits  20    OT Start Time  1236   pt's husband checked her in after therapist took her back for session   OT Stop Time  1315    OT Time Calculation (min)  39 min    Activity Tolerance  Patient tolerated treatment well    Behavior During Therapy  [facility] for tasks assessed/performed       Past Medical History:  Diagnosis Date  . Cyst 07-2008   Cyst on back x2 that were drained  . H/O echocardiogram 02-2000  . Hx of colonic polyps   . Hyperlipidemia   . Hypertension   . Varicose veins of both lower extremities     Past Surgical History:  Procedure Laterality Date  . POLYPECTOMY     Colon  . TONSILLECTOMY    . TUBAL LIGATION      There were no vitals filed for this visit.  Subjective Assessment - 01/19/19 1239    Patient is accompanied by:  Family member   husband [name])   Pertinent History  78 y.o female with recent declining cognitive abilities, per MRI -right frontal, right parietal and right cerebellar encephalomalacia and gliosis, possible embolic chronic ischemic infarcts.Pt has hx of ankle fx, hyperlipidemia, and thumb CMC arthritis. Per pt's husband MD is still trying to determine a definative diagnosis, per chart, possible PSP vs. TBI.    Limitations  fall risk    Patient Stated Goals  improved use of L hand, to be as independent as possible    Currently in Pain?  No/denies              Treatment: Pt ambulated to gym with  handheld min A. She requires v.c to avoid sitting down rapidly. She remains impulsive. Cane exercises in seated shoulder flexion, abduction, and chest press 10 reps each, min v.c Checked progress towards goals. Pt continues to progress. Constant therapy matching game with pt using LUE, min-mod v.c Standing to perform functional reaching to place washers on targets with LUE, min-mod v.c               OT Short Term Goals - 01/19/19 1245      OT SHORT TERM GOAL #1   Title  Pt/ caregiver will be I with HEP.    Time  4    Period  Weeks    Status  Achieved    Target Date  12/22/18      OT SHORT TERM GOAL #2   Title  Pt/ husband will verbalize understanding of adapted strategies to maximize safety and independence with ADLs/ IADLs.    Time  4    Period  Weeks    Status  Achieved   Pt's husband reports ADLs are going well, he is using questioning cues     OT SHORT TERM GOAL #3   Title  Pt will demonstrate ability to retrieve a lightweight  object at 100 shoulder flexion with LUE.    Time  4    Period  Weeks    Status  Achieved   100     OT SHORT TERM GOAL #4   Title  Pt will demonstrate ability to retrieve a lightweight object at 115 shoulder flexion with RUE.    Time  4    Period  Weeks    Status  Achieved   120       OT Long Term Goals - 01/19/19 1247      OT LONG TERM GOAL #1   Title  Pt will increase LUE funtional use and reach as eveidenced by increasing LUE box/ blocks to 15 blocks consistently.    Baseline  01/19/2019-13, 14 blocks    Time  5    Period  Weeks    Status  On-going   13 blocks,     OT LONG TERM GOAL #2   Title  Pt will increase LUE grip strength to 25 lbs or greater for increased ease with ADLS.    Baseline  19 lbs on 01/19/2019    Time  5    Period  Weeks    Status  On-going   19 lbs 01/19/2019     OT LONG TERM GOAL #3   Title  Pt will demonstrate ability to write her name legibly with good letter size x 3  trials.    Time  5     Period  Weeks    Status  On-going   Pt writes her name legibly at times, inconsistent, pt does best if copying her name.     OT LONG TERM GOAL #4   Title  Pt will demonstrate ability to retrieve a lightweight object aty 105 shoulder flexion with LUE.    Time  --    Period  Weeks    Status  On-going   100*     OT LONG TERM GOAL #5   Title  Pt will consistently use LUE to assist with ADLs with no more than min v.c    Time  5    Period  Weeks    Status  New            Plan - 01/19/19 1758    Clinical Impression Statement  Pt is progressing towards goals with improving LUE functional use and attention.    Occupational performance deficits (Please refer to evaluation for details):  ADL's;IADL's;Leisure;Work;Social Participation    Body Structure / Function / Physical Skills  ADL;UE functional use;Balance;Flexibility;Vision;FMC;ROM;Gait;Coordination;GMC;Sensation;Decreased knowledge of precautions;Decreased knowledge of use of DME;IADL;Strength;Dexterity;Mobility    Cognitive Skills  Attention;Learn;Memory;Problem Solve;Safety Awareness;Sequencing;Thought;Understand    Rehab Potential  Fair    OT Frequency  2x / week    OT Duration  6 weeks    OT Treatment/Interventions  Self-care/ADL training;Energy conservation;Visual/perceptual remediation/compensation;Patient/family education;DME and/or AE instruction;Aquatic Therapy;Paraffin;Passive range of motion;Balance training;Fluidtherapy;Cryotherapy;Therapist, nutritional;Therapeutic activities;Manual Therapy;Therapeutic exercise;Moist Heat;Neuromuscular education;Cognitive remediation/compensation    Plan  simple functional tasks, LUE forced use and attention to Lt side. Pt is impulsive recommend HHA with ambulation.   Consulted and Agree with Plan of Care  Patient;Family member/caregiver    Family Member Consulted  husband       Patient will benefit from skilled therapeutic intervention in order to improve the following deficits  and impairments:   Body Structure / Function / Physical Skills: ADL, UE functional use, Balance, Flexibility, Vision, FMC, ROM, Gait, Coordination, GMC, Sensation, Decreased knowledge of precautions, Decreased knowledge  of use of DME, IADL, Strength, Dexterity, Mobility Cognitive Skills: Attention, Learn, Memory, Problem Solve, Safety Awareness, Sequencing, Thought, Understand     Visit Diagnosis: Muscle weakness (generalized) - Plan: Ot plan of care cert/re-cert  Frontal lobe and executive function deficit - Plan: Ot plan of care cert/re-cert  Attention and concentration deficit - Plan: Ot plan of care cert/re-cert  Visuospatial deficit - Plan: Ot plan of care cert/re-cert  Other lack of coordination - Plan: Ot plan of care cert/re-cert    Problem List Patient Active Problem List   Diagnosis Date Noted  . Fluency disorder associated with underlying disease 02/08/2018  . Cognitive and neurobehavioral dysfunction 02/08/2018  . 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 Arthritis of carpometacarpal [facility] joint of left thumb 01/24/2018  . Ankle fracture, left 09/03/2016  . Hand dysfunction 03/11/2016  . Symptomatic varicose veins, bilateral 03/11/2016  . Numbness of left thumb 03/11/2016  . Elevated BP 07/27/2014  . Bunion of left foot 07/26/2014  . Abnormality of gait 07/26/2014  . Cough 11/10/2013  . Post-nasal drainage 11/10/2013  . Abnormal urine odor 11/10/2013  . Visit for preventive health examination 07/28/2013  . Right anterior knee pain 07/22/2012  . Medicare annual wellness visit, subsequent 07/25/2011  . ANXIETY, SITUATIONAL 09/25/2009  . ARTHRITIS 09/25/2009  . Vitamin D deficiency 09/27/2008  . Hyperlipidemia 09/17/2008  . Elevated blood pressure reading without diagnosis of hypertension 09/17/2008  . COLONIC POLYPS, HX OF 09/17/2008  . PERSONAL HISTORY DISEASES SKIN&SUBCUT TISSUE 09/17/2008    Rooney Gladwin 01/19/2019, 6:04 PM  [facility] [address] [place], [place], [address] Phone: [phone]   Fax:  [phone]  Name: Pamela Alvarez MRN: [id number] Date of Birth: 07/07/1940

## 2019-01-19 NOTE — Therapy (Signed)
[facility] [address] [place], [place], [address] Phone: [phone]   Fax:  [phone]  Speech Language Pathology Treatment  Patient Details  Name: Pamela Alvarez MRN: [id number] Date of Birth: 11/17/1940 Referring Provider (SLP): Dohmeier, Asencion Partridge, MD   Encounter Date: 01/19/2019  End of Session - 01/19/19 1612    Visit Number  14    Number of Visits  17    Date for SLP Re-Evaluation  02/20/19    SLP Start Time  2119    SLP Stop Time   1445    SLP Time Calculation (min)  40 min    Activity Tolerance  Patient tolerated treatment well       Past Medical History:  Diagnosis Date  . Cyst 07-2008   Cyst on back x2 that were drained  . H/O echocardiogram 02-2000  . Hx of colonic polyps   . Hyperlipidemia   . Hypertension   . Varicose veins of both lower extremities     Past Surgical History:  Procedure Laterality Date  . POLYPECTOMY     Colon  . TONSILLECTOMY    . TUBAL LIGATION      There were no vitals filed for this visit.         ADULT SLP TREATMENT - 01/19/19 1600      General Information   Behavior/Cognition  Alert;Cooperative;Pleasant mood      Treatment Provided   Treatment provided  Cognitive-Linquistic      Cognitive-Linquistic Treatment   Treatment focused on  Aphasia;Patient/family/caregiver education    Skilled Treatment  Pt continues to work on home exercises using [place] therapy. SLP facilitated conversation re: topic of pt interest (election and politics). SLP wrote down key words to support pt's participation. SLP demo'd using key words during conversation by pointing to them during discussion. Patient returned demonstration and generated sentences sharing her opinions when pointing to key words ([place], [place], [place], [place], battleground). In subsequent conversation re: pt's return to school to complete university and ultimately [name] later in life, SLP again used  strategy of written key words, which was helpful in correcting breakdowns/clarifying information (pt's age when returning to school, ages of her children, schools she attended). Pt/husband both report improvement in their ability to communicate; husband reports they are having conversations daily with pt responding in sentences, and are able to talk in the car which was difficult prior to therapy. Husband asked SLP if improvement noted and SLP agreed, praising pt for work outside of therapy as well as spouse for using strategies to support patient's communication (mostly allowing more time, they are not using written keywords as much at home as husband reports pt speech improving). Husband told SLP his goal is for patient to be able to contribute more in group conversations with friends; told husband that writing key words for patient during the conversation and pausing intermittently to ask her opinion would be a good strategy to try.       Assessment / Recommendations / Plan   Plan  Continue with current plan of care      Progression Toward Goals   Progression toward goals  Progressing toward goals       SLP Education - 01/19/19 1611    Education Details  writing key words reduces linguistic burden for patient in group conversations and may improve her ability to respond more quickly    Person(s) Educated  Patient;Spouse    Methods  Explanation;Demonstration;Verbal cues  Comprehension  Verbalized understanding;Need further instruction       SLP Short Term Goals - 01/10/19 1307      SLP SHORT TERM GOAL #1   Title  pt will demo ability for functionalality with simple household langauge tasks (e.g., grocery list, errand list, to-do list) over 3 sessions    Time  1    Period  Weeks    Status  Not Met      SLP SHORT TERM GOAL #2   Title  pt will achieve functional communication of wants/needs at home (reported, if needed) over two sessions    Time  1    Period  Weeks    Status  Not Met       SLP SHORT TERM GOAL #3   Title  pt will demo correct right/left comprehension in simple functional commands 9/10 opportunities    Time  1    Period  Weeks    Status  Not Met      SLP SHORT TERM GOAL #4   Title  pt will complete full aphasia eval before total visit #3    Baseline  form 1 completed 12/06/18    Status  Achieved      SLP SHORT TERM GOAL #5   Title  pt will complete cognitive linguistic eval PRN if deemed clinically necessary    Baseline  started 12/29/18    Time  1    Period  Weeks    Status  Partially Met       SLP Long Term Goals - 01/13/19 1345      SLP LONG TERM GOAL #1   Title  pt will demonstrate improved verbal communication (as rated by pt) compared to when ST began    Period  Weeks    Status  Achieved      SLP LONG TERM GOAL #2   Title  pt will report speech and language is less frustrating than prior to initiation of ST    Time  3    Period  Weeks    Status  On-going      SLP LONG TERM GOAL #3   Title  pt will produce functional verbal expression in 3 minutes simple conversation over 3 sessions    Time  3    Period  Weeks    Status  On-going      SLP LONG TERM GOAL #4   Title  pt will use multimodal communication PRN to express wants/needs    Time  3    Period  Weeks    Status  On-going      SLP LONG TERM GOAL #5   Title  pt will functionally write her first name independently in 7/10 attempts over 3 sessions    Time  3    Period  Weeks    Status  On-going       Plan - 01/19/19 1613    Clinical Impression Statement  Pt continues to present as an atypical TBI pt or CVA pt with mild-moderate (more moderate) anomic aphasia per [employer] Aphasia Battery-Revised; cognitive deficits including visuospatial skills, organization, slow processing ID'd per CLQT. See "skilled intervention" for details on today's session. Per husband, pt working with therapy tasks in [employer] app 30 min daily. SLP recommends cont'd course of skilled ST to address  pt's decr'd language-cogniton ability/possible verbal apraxia.to incr. pt independence    Speech Therapy Frequency  2x / week    Duration  --   8  weeks or 17 visits   Treatment/Interventions  SLP instruction and feedback;Compensatory strategies;Patient/family education;Multimodal communcation approach;Cueing hierarchy;Language facilitation;Functional tasks;Oral motor exercises;Internal/external aids;Environmental controls;Cognitive reorganization    Potential to Achieve Goals  Fair    Potential Considerations  Severity of impairments;Other (comment)       Patient will benefit from skilled therapeutic intervention in order to improve the following deficits and impairments:   Aphasia  Cognitive communication deficit  Verbal apraxia    Problem List Patient Active Problem List   Diagnosis Date Noted  . Fluency disorder associated with underlying disease 02/08/2018  . Cognitive and neurobehavioral dysfunction 02/08/2018  . 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 Arthritis of carpometacarpal [facility]) joint of left thumb 01/24/2018  . Ankle fracture, left 09/03/2016  . Hand dysfunction 03/11/2016  . Symptomatic varicose veins, bilateral 03/11/2016  . Numbness of left thumb 03/11/2016  . Elevated BP 07/27/2014  . Bunion of left foot 07/26/2014  . Abnormality of gait 07/26/2014  . Cough 11/10/2013  . Post-nasal drainage 11/10/2013  . Abnormal urine odor 11/10/2013  . Visit for preventive health examination 07/28/2013  . Right anterior knee pain 07/22/2012  . Medicare annual wellness visit, subsequent 07/25/2011  . ANXIETY, SITUATIONAL 09/25/2009  . ARTHRITIS 09/25/2009  . Vitamin D deficiency 09/27/2008  . Hyperlipidemia 09/17/2008  . Elevated blood pressure reading without diagnosis of hypertension 09/17/2008  . COLONIC POLYPS, HX OF 09/17/2008  . PERSONAL HISTORY DISEASES SKIN&SUBCUT TISSUE 09/17/2008   Deneise Lever, [place], CCC-SLP Speech-Language Pathologist   Aliene Altes 01/19/2019, 4:16 PM  [facility] [address] [place], [place], [address] Phone: [phone]   Fax:  [phone]   Name: Clinton Wahlberg MRN: [id number] Date of Birth: 1940/12/28

## 2019-01-19 NOTE — Therapy (Signed)
[facility] [address] [place], [place], [address] Phone: [phone]   Fax:  [phone]  Physical Therapy Treatment  Patient Details  Name: Pamela Alvarez MRN: [id number] Date of Birth: 1941/02/10 Referring Provider (PT): Dohmeier, Asencion Partridge   Encounter Date: 01/19/2019  PT End of Session - 01/19/19 2110    Visit Number  14    Number of Visits  18   per recert 67/70/3403   Date for PT Re-Evaluation  52/48/18   per recert 59/11/3110   Authorization Type  [employer] Medicare-will need 10th visit progress note    PT Start Time  1418    PT Stop Time  1458    PT Time Calculation (min)  40 min    Equipment Utilized During Treatment  Gait belt    Activity Tolerance  Patient tolerated treatment well    Behavior During Therapy  [facility] for tasks assessed/performed       Past Medical History:  Diagnosis Date  . Cyst 07-2008   Cyst on back x2 that were drained  . H/O echocardiogram 02-2000  . Hx of colonic polyps   . Hyperlipidemia   . Hypertension   . Varicose veins of both lower extremities     Past Surgical History:  Procedure Laterality Date  . POLYPECTOMY     Colon  . TONSILLECTOMY    . TUBAL LIGATION      There were no vitals filed for this visit.  Subjective Assessment - 01/19/19 1320    Subjective  Doing okay.    Patient is accompained by:  Family member    Patient Stated Goals  Pt's goal for therapy is better balance and walking.    Currently in Pain?  No/denies                       [facility] Adult PT Treatment/Exercise - 01/19/19 0001      Ambulation/Gait   Ambulation/Gait  Yes    Ambulation/Gait Assistance  4: Min guard;5: Supervision    Ambulation/Gait Assistance Details  Gait with conversation tasks today, with pt able to maintain speed and safety with rollator with conversation tasks.    Ambulation Distance (Feet)  400 Feet   100 ft x 2   Assistive device  Rollator    Gait Pattern   Step-through pattern;Decreased step length - right;Decreased step length - left;Shuffle;Decreased dorsiflexion - right;Decreased dorsiflexion - left    Ambulation Surface  Level;Indoor    Stairs  Yes    Stairs Assistance  4: Min guard;4: Min assist    Stairs Assistance Details (indicate cue type and reason)  Pt ascends steps with step-to pattern forward; descends steps facing sideways, crossover, step-to pattern with bilateral hands on one rail.  After first trial, PT assists pt with step-through pattern forwards ascending, and step-to pattern forwards descending.  Pt very reluctant to do this descending the steps.    Stair Management Technique  Two rails;Step to pattern;Forwards;Sideways    Number of Stairs  4    Height of Stairs  6    Gait Comments  Cues for turning to sit, cues to PAUSE/STOP prior to reaching back to sit, x 3 reps      Self-Care   Self-Care  Other Self-Care Comments    Other Self-Care Comments   Discussed POC, including pt/husband's continued reports of functional progress.  Discussed plan to check goals next week and extend POC through mid-December continueing to work on balance, gait,  funcitonal strength, transition back to Pilates exercises.        Husband reports pt walking at least 8 minutes with rollator at home with supervision.  He is hesitant to let her walk without his supervision.    Balance Exercises - 01/19/19 1345      Balance Exercises: Standing   Step Ups  Forward;6 inch;UE support 2   x 10 reps   Other Standing Exercises  Step taps to 6" step, then 12' step, consecutive legs x 10 reps; then alternating step taps x 10 reps to 6" step with bilateral UE support, then with 1 UE support, 2nd set.  Forward step downs from 4" step, bilateral UE support, with min assist, for improved ability for improved forward descending pattern with steps.          PT Short Term Goals - 12/29/18 1103      PT SHORT TERM GOAL #1   Title  Pt will perform HEP with family  supervision for improved transfers, balance, and gait.  UPDATED TARGET 12/30/2018 ( due to later start with later scheduling)    Time  4    Period  Weeks    Status  Achieved      PT SHORT TERM GOAL #2   Title  Pt will perform 4 of 5 reps of sit<>stand transfers with correct technique, with husband supervision and cues, for improved safety with transfers.    Time  4    Period  Weeks    Status  Achieved      PT SHORT TERM GOAL #3   Title  Pt will improve Berg Balance score to at least 24/56 for decreased fall risk.    Baseline  31/56 12/27/2018    Time  4    Period  Weeks    Status  Achieved      PT SHORT TERM GOAL #4   Title  Pt will improve TUG score to less than or equal to 23 seconds for decreased fall risk.    Baseline  10/22:  25.68 sec, 25.88 sec    Time  4    Period  Weeks    Status  Not Met      PT SHORT TERM GOAL #5   Title  Pt/husband will verbalize understanding of fall prevention in home environment.    Time  4    Period  Weeks    Status  Achieved        PT Long Term Goals - 01/03/19 1319      PT LONG TERM GOAL #1   Title  Pt will perform updated HEP for balance, gait, with husband's supervision.  UPDATED TARGET 01/27/2019    Time  4   PER RECERT 16/83/7290   Period  Weeks    Status  On-going    Target Date  01/27/19      PT LONG TERM GOAL #2   Title  Pt will improve gait velocity to at  least 1.5 ft/sec with appropriate device and supervision, for improved gait efficiency.    Time  4    Period  Weeks    Status  On-going    Target Date  01/27/19      PT LONG TERM GOAL #3   Title  Pt/husband will verbalize/demonstrate understanding of appropriate floor>stand transfers for safe fall recovery.    Time  4    Period  Weeks    Status  On-going    Target Date  01/27/19  PT LONG TERM GOAL #4   Title  Pt will improve Berg balance score to at least 38/56 for decreased fall risk.    Time  4    Period  Weeks    Status  New    Target Date  01/27/19             Plan - 01/19/19 2111    Clinical Impression Statement  Focused PT session today on gait training with dual task (conversation) as well as standing balance activities and stair negotiation.  With repetition of SLS activities at steps as well as step ups and step downs, pt is able to improve with technique and confidence.  Will continue to beneift from skilled PT to address gait, balance, functional strength for improved mobility and decreased fall risk.    Personal Factors and Comorbidities  Comorbidity 3+    Comorbidities  PMH includes ankle fracture, HTN, hyperlipidemia, recurrent falls    Examination-Activity Limitations  Squat;Stairs;Stand;Transfers;Locomotion Level    Examination-Participation Restrictions  Community Activity    Stability/Clinical Decision Making  Evolving/Moderate complexity    Rehab Potential  Fair   MMSE score 16/30; good family support   PT Frequency  2x / week    PT Duration  4 weeks   per recert 16/10/96, including this week   PT Treatment/Interventions  ADLs/Self Care Home Management;Gait training;DME Instruction;Neuromuscular re-education;Balance training;Therapeutic exercise;Therapeutic activities;Functional mobility training;Patient/family education    PT Next Visit Plan  Work torwards LTGs; Balance activities focusing on SLS activities as well as lower extremity flexibity, exercises for psoterior direction stepping, weightshifting, retro walking;  stair negoitation   Please note: week 3 of 4 in POC (wk of 01/17/2019); pt scheduled further out-will likely check goals and extend POC   Consulted and Agree with Plan of Care  Patient;Family member/caregiver    Family Member Consulted  Husband       Patient will benefit from skilled therapeutic intervention in order to improve the following deficits and impairments:  Abnormal gait, Difficulty walking, Decreased safety awareness, Decreased balance, Decreased mobility  Visit Diagnosis: Other  abnormalities of gait and mobility  Unsteadiness on feet     Problem List Patient Active Problem List   Diagnosis Date Noted  . Fluency disorder associated with underlying disease 02/08/2018  . Cognitive and neurobehavioral dysfunction 02/08/2018  . 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 Arthritis of carpometacarpal [facility]) joint of left thumb 01/24/2018  . Ankle fracture, left 09/03/2016  . Hand dysfunction 03/11/2016  . Symptomatic varicose veins, bilateral 03/11/2016  . Numbness of left thumb 03/11/2016  . Elevated BP 07/27/2014  . Bunion of left foot 07/26/2014  . Abnormality of gait 07/26/2014  . Cough 11/10/2013  . Post-nasal drainage 11/10/2013  . Abnormal urine odor 11/10/2013  . Visit for preventive health examination 07/28/2013  . Right anterior knee pain 07/22/2012  . Medicare annual wellness visit, subsequent 07/25/2011  . ANXIETY, SITUATIONAL 09/25/2009  . ARTHRITIS 09/25/2009  . Vitamin D deficiency 09/27/2008  . Hyperlipidemia 09/17/2008  . Elevated blood pressure reading without diagnosis of hypertension 09/17/2008  . COLONIC POLYPS, HX OF 09/17/2008  . PERSONAL HISTORY DISEASES SKIN&SUBCUT TISSUE 09/17/2008    Shantoya Geurts W. 01/19/2019, 9:15 PM Frazier Butt., PT  [facility] [address] [place], [place], [address] Phone: [phone]   Fax:  [phone]  Name: Pamela Alvarez MRN: [id number] Date of Birth: [birth year]

## 2019-01-25 ENCOUNTER — Ambulatory Visit: Payer: Medicare Other | Admitting: Speech Pathology

## 2019-01-25 ENCOUNTER — Other Ambulatory Visit: Payer: Self-pay

## 2019-01-25 ENCOUNTER — Encounter: Payer: Self-pay | Admitting: Speech Pathology

## 2019-01-25 ENCOUNTER — Ambulatory Visit: Payer: Medicare Other | Admitting: Occupational Therapy

## 2019-01-25 ENCOUNTER — Ambulatory Visit: Payer: Medicare Other | Admitting: Physical Therapy

## 2019-01-25 ENCOUNTER — Encounter: Payer: Self-pay | Admitting: Physical Therapy

## 2019-01-25 DIAGNOSIS — R278 Other lack of coordination: Secondary | ICD-10-CM

## 2019-01-25 DIAGNOSIS — R41841 Cognitive communication deficit: Secondary | ICD-10-CM

## 2019-01-25 DIAGNOSIS — M6281 Muscle weakness (generalized): Secondary | ICD-10-CM | POA: Diagnosis not present

## 2019-01-25 DIAGNOSIS — R41840 Attention and concentration deficit: Secondary | ICD-10-CM

## 2019-01-25 DIAGNOSIS — R41844 Frontal lobe and executive function deficit: Secondary | ICD-10-CM

## 2019-01-25 DIAGNOSIS — R2689 Other abnormalities of gait and mobility: Secondary | ICD-10-CM

## 2019-01-25 DIAGNOSIS — R41842 Visuospatial deficit: Secondary | ICD-10-CM

## 2019-01-25 DIAGNOSIS — R2681 Unsteadiness on feet: Secondary | ICD-10-CM

## 2019-01-25 DIAGNOSIS — R482 Apraxia: Secondary | ICD-10-CM

## 2019-01-25 DIAGNOSIS — R4701 Aphasia: Secondary | ICD-10-CM

## 2019-01-25 NOTE — Therapy (Signed)
[place] [address] [place], [place], [address] Phone: [phone]   Fax:  [phone]  Physical Therapy Treatment  Patient Details  Name: Pamela Alvarez MRN: [id number] Date of Birth: [birth year] Referring Provider (PT): Dohmeier, Asencion Partridge   Encounter Date: 01/25/2019  PT End of Session - 01/25/19 2233    Visit Number  15    Number of Visits  18   per recert 33/00/7622   Date for PT Re-Evaluation  63/33/54   per recert 56/25/6389   Authorization Type  [employer] Medicare-will need 10th visit progress note    PT Start Time  1447    PT Stop Time  1328    PT Time Calculation (min)  1361 min    Equipment Utilized During Treatment  Gait belt    Activity Tolerance  Patient tolerated treatment well    Behavior During Therapy  [facility] for tasks assessed/performed       Past Medical History:  Diagnosis Date  . Cyst 07-2008   Cyst on back x2 that were drained  . H/O echocardiogram 02-2000  . Hx of colonic polyps   . Hyperlipidemia   . Hypertension   . Varicose veins of both lower extremities     Past Surgical History:  Procedure Laterality Date  . POLYPECTOMY     Colon  . TONSILLECTOMY    . TUBAL LIGATION      There were no vitals filed for this visit.  Subjective Assessment - 01/25/19 1448    Subjective  Nothing new, no changes.  No falls since last visit.    Patient is accompained by:  Family member    Patient Stated Goals  Pt's goal for therapy is better balance and walking.    Currently in Pain?  No/denies                       [facility] Adult PT Treatment/Exercise - 01/25/19 0001      Transfers   Transfers  Sit to Stand;Stand to Sit    Sit to Stand  5: Supervision;4: Min guard;With upper extremity assist;From bed;From chair/3-in-1    Sit to Stand Details  Verbal cues for sequencing;Verbal cues for technique    Stand to Sit  5: Supervision;With upper extremity assist;To bed;To chair/3-in-1    Stand to Sit Details (indicate cue type and reason)  Verbal cues for technique;Verbal cues for sequencing    Number of Reps  Other reps (comment)   at least 5 reps throughout session     Ambulation/Gait   Ambulation/Gait  Yes    Ambulation/Gait Assistance  4: Min guard    Ambulation/Gait Assistance Details  Short distances gait with HHA, from mat<>steps, mat<>counter, then 115 ft around clinic    Ambulation Distance (Feet)  115 Feet    Assistive device  1 person hand held assist    Gait Pattern  Step-through pattern;Decreased step length - right;Decreased step length - left;Shuffle;Decreased dorsiflexion - right;Decreased dorsiflexion - left    Ambulation Surface  Level;Indoor    Gait velocity  18.38 sec = 1.78 ft/sec          Balance Exercises - 01/25/19 1451      Balance Exercises: Standing   Standing Eyes Opened  Wide (BOA);Head turns;Solid surface;5 reps   head nods; 2 sets   SLS with Vectors  Solid surface;Upper extremity assist 2   R foot propped, LLE as stance;  alt UE raises x 5   Stepping  Strategy  Posterior;Lateral;10 reps;UE support    Step Ups  Forward;6 inch;UE support 2;4 inch   4" step up at aerobic step, 1 UE support; x 10 reps each   Marching Limitations  Marching in place in corner, x 10 reps, repeated cues for increased step height/foot clearance.    Other Standing Exercises  Step taps to 6" step, then 12' step, alternating taps x 10 reps each, bilateral UE support, then with 1 UE support, alternating step taps to 6" step x 10 reps.  Forward step downs from 4" step, x 5 reps each, 1 UE support at counter, with min assist, for improved ability for improved forward descending pattern with steps.  Side step taps x 10 reps to 6" step each, 1 UE support.  Trunk rotation in corner, reaching across body, x 10 reps, with tactile, visual, verbal cues for improved trunk rotation       Pt needs tactile cues at times for LLE to initiate step taps/step ups.   PT Short Term  Goals - 12/29/18 1103      PT SHORT TERM GOAL #1   Title  Pt will perform HEP with family supervision for improved transfers, balance, and gait.  UPDATED TARGET 12/30/2018 ( due to later start with later scheduling)    Time  4    Period  Weeks    Status  Achieved      PT SHORT TERM GOAL #2   Title  Pt will perform 4 of 5 reps of sit<>stand transfers with correct technique, with husband supervision and cues, for improved safety with transfers.    Time  4    Period  Weeks    Status  Achieved      PT SHORT TERM GOAL #3   Title  Pt will improve Berg Balance score to at least 24/56 for decreased fall risk.    Baseline  31/56 12/27/2018    Time  4    Period  Weeks    Status  Achieved      PT SHORT TERM GOAL #4   Title  Pt will improve TUG score to less than or equal to 23 seconds for decreased fall risk.    Baseline  10/22:  25.68 sec, 25.88 sec    Time  4    Period  Weeks    Status  Not Met      PT SHORT TERM GOAL #5   Title  Pt/husband will verbalize understanding of fall prevention in home environment.    Time  4    Period  Weeks    Status  Achieved        PT Long Term Goals - 01/25/19 1526      PT LONG TERM GOAL #1   Title  Pt will perform updated HEP for balance, gait, with husband's supervision.  UPDATED TARGET 01/27/2019    Time  4   PER RECERT 16/03/930   Period  Weeks    Status  On-going      PT LONG TERM GOAL #2   Title  Pt will improve gait velocity to at  least 1.5 ft/sec with appropriate device and supervision, for improved gait efficiency.    Baseline  1.73 ft/sec 01/25/2019    Time  4    Period  Weeks    Status  On-going      PT LONG TERM GOAL #3   Title  Pt/husband will verbalize/demonstrate understanding of appropriate floor>stand transfers for safe fall recovery.  Time  4    Period  Weeks    Status  On-going      PT LONG TERM GOAL #4   Title  Pt will improve Berg balance score to at least 38/56 for decreased fall risk.    Time  4    Period   Weeks    Status  New            Plan - 01/25/19 2234    Clinical Impression Statement  Continued to work on balance activities, with focus on static and dynamic SLS activities, with pt needing tactile/manual cues at times, but overall performing multiple new activities this session.  Assessed gait velocity without assistive device, with pt improved gait velocity to 1.78 ft/sec, improved from 1.33 ft/sec.  Will plan to further assess remaining LTGs next visit.    Personal Factors and Comorbidities  Comorbidity 3+    Comorbidities  PMH includes ankle fracture, HTN, hyperlipidemia, recurrent falls    Examination-Activity Limitations  Squat;Stairs;Stand;Transfers;Locomotion Level    Examination-Participation Restrictions  Community Activity    Stability/Clinical Decision Making  Evolving/Moderate complexity    Rehab Potential  Fair   MMSE score 16/30; good family support   PT Frequency  2x / week    PT Duration  4 weeks   per recert 94/76/54, including this week   PT Treatment/Interventions  ADLs/Self Care Home Management;Gait training;DME Instruction;Neuromuscular re-education;Balance training;Therapeutic exercise;Therapeutic activities;Functional mobility training;Patient/family education    PT Next Visit Plan  Check LTGs and complete recert (3-4 additional weeks); focus on SLS activities, posterior direction stepping/weightshifting; stair negotiation    Consulted and Agree with Plan of Care  Patient;Family member/caregiver    Family Member Consulted  Husband       Patient will benefit from skilled therapeutic intervention in order to improve the following deficits and impairments:  Abnormal gait, Difficulty walking, Decreased safety awareness, Decreased balance, Decreased mobility  Visit Diagnosis: Unsteadiness on feet  Other abnormalities of gait and mobility     Problem List Patient Active Problem List   Diagnosis Date Noted  . Fluency disorder associated with underlying  disease 02/08/2018  . Cognitive and neurobehavioral dysfunction 02/08/2018  . 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 Arthritis of carpometacarpal [facility]) joint of left thumb 01/24/2018  . Ankle fracture, left 09/03/2016  . Hand dysfunction 03/11/2016  . Symptomatic varicose veins, bilateral 03/11/2016  . Numbness of left thumb 03/11/2016  . Elevated BP 07/27/2014  . Bunion of left foot 07/26/2014  . Abnormality of gait 07/26/2014  . Cough 11/10/2013  . Post-nasal drainage 11/10/2013  . Abnormal urine odor 11/10/2013  . Visit for preventive health examination 07/28/2013  . Right anterior knee pain 07/22/2012  . Medicare annual wellness visit, subsequent 07/25/2011  . ANXIETY, SITUATIONAL 09/25/2009  . ARTHRITIS 09/25/2009  . Vitamin D deficiency 09/27/2008  . Hyperlipidemia 09/17/2008  . Elevated blood pressure reading without diagnosis of hypertension 09/17/2008  . COLONIC POLYPS, HX OF 09/17/2008  . PERSONAL HISTORY DISEASES SKIN&SUBCUT TISSUE 09/17/2008    Shanikqua Zarzycki W. 01/25/2019, 10:39 PM  Frazier Butt., PT   [place] [address] [place], [place], [address] Phone: [phone]   Fax:  [phone]  Name: Kaidan Spengler MRN: [id number] Date of Birth: [birth year]

## 2019-01-25 NOTE — Patient Instructions (Addendum)
   Decide a topic before a get together with friends. Before they come over write down some key words or sentences have a  Practice conversation. You can leave the words out in Pamela Alvarez's sight so she can participate  Write down some things or questions you want to communicate with family or friends over the phone. It's OK to read them - the other person can't see you  Conversation practice is key - try an aphasia group or get out albums, news stories, and discuss current events or memories

## 2019-01-25 NOTE — Therapy (Signed)
[facility] [address] [place], [place], [address] Phone: [phone]   Fax:  [phone]  Speech Language Pathology Treatment  Patient Details  Name: Pamela Alvarez MRN: [id number] Date of Birth: 06/13/1940 Referring Provider (SLP): Dohmeier, Asencion Partridge, MD   Encounter Date: 01/25/2019  End of Session - 01/25/19 1503    Visit Number  15    Number of Visits  17    Date for SLP Re-Evaluation  02/20/19    SLP Start Time  6712    SLP Stop Time   1446    SLP Time Calculation (min)  43 min    Activity Tolerance  Patient tolerated treatment well       Past Medical History:  Diagnosis Date  . Cyst 07-2008   Cyst on back x2 that were drained  . H/O echocardiogram 02-2000  . Hx of colonic polyps   . Hyperlipidemia   . Hypertension   . Varicose veins of both lower extremities     Past Surgical History:  Procedure Laterality Date  . POLYPECTOMY     Colon  . TONSILLECTOMY    . TUBAL LIGATION      There were no vitals filed for this visit.  Subjective Assessment - 01/25/19 1449    Subjective  "She is doing so well talking with me at home"    Patient is accompained by:  Family member   sposue   Currently in Pain?  No/denies            ADULT SLP TREATMENT - 01/25/19 1410      General Information   Behavior/Cognition  Alert;Cooperative;Pleasant mood      Treatment Provided   Treatment provided  Cognitive-Linquistic      Cognitive-Linquistic Treatment   Treatment focused on  Aphasia;Patient/family/caregiver education    Skilled Treatment  Pt's spouse reports daily practice on [employer] 30-40 minutes. Facilitated conversation today re: past vacations using written cues and extra time to facilitated participation in conversation and current events.  Pt is using thumbs up/down for yes no questions accurately.  Spouse continues to report successful conversations at home. He had questions re: improving Kelbi's  participation in conversations with friends. We discussed pre-planning a topic, such as vacations, jobs, holidays etc prior to friends visiting. Writing down some words/phrases and practicing them beforehand. The visitors may benefit from knowing the topic beforehand to help facilitate Haile's particiation. We also discussed writing down comments/questions Seema would like to ask over the phone with family, that she could read to improve her communication with family. Re-educated sposue and pt re: aphasia groups as a good was to practice conversation as well.       Assessment / Recommendations / Plan   Plan  Continue with current plan of care      Progression Toward Goals   Progression toward goals  Progressing toward goals       SLP Education - 01/25/19 1500    Education Details  compensations for conversations participation and phone conversations    Person(s) Educated  Patient;Spouse    Methods  Explanation;Demonstration;Handout    Comprehension  Verbalized understanding       SLP Short Term Goals - 01/25/19 1502      SLP SHORT TERM GOAL #1   Title  pt will demo ability for functionalality with simple household langauge tasks (e.g., grocery list, errand list, to-do list) over 3 sessions    Time  1    Period  Weeks  Status  Not Met      SLP SHORT TERM GOAL #2   Title  pt will achieve functional communication of wants/needs at home (reported, if needed) over two sessions    Time  1    Period  Weeks    Status  Not Met      SLP SHORT TERM GOAL #3   Title  pt will demo correct right/left comprehension in simple functional commands 9/10 opportunities    Time  1    Period  Weeks    Status  Not Met      SLP SHORT TERM GOAL #4   Title  pt will complete full aphasia eval before total visit #3    Baseline  form 1 completed 12/06/18    Status  Achieved      SLP SHORT TERM GOAL #5   Title  pt will complete cognitive linguistic eval PRN if deemed clinically necessary    Baseline   started 12/29/18    Time  1    Period  Weeks    Status  Partially Met       SLP Long Term Goals - 01/25/19 1502      SLP LONG TERM GOAL #1   Title  pt will demonstrate improved verbal communication (as rated by pt) compared to when ST began    Period  Weeks    Status  Achieved      SLP LONG TERM GOAL #2   Title  pt will report speech and language is less frustrating than prior to initiation of ST    Time  2    Period  Weeks    Status  On-going      SLP LONG TERM GOAL #3   Title  pt will produce functional verbal expression in 3 minutes simple conversation over 3 sessions    Time  2    Period  Weeks    Status  On-going      SLP LONG TERM GOAL #4   Title  pt will use multimodal communication PRN to express wants/needs    Time  2    Period  Weeks    Status  On-going      SLP LONG TERM GOAL #5   Title  pt will functionally write her first name independently in 7/10 attempts over 3 sessions    Time  2    Period  Weeks    Status  On-going       Plan - 01/25/19 1501    Clinical Impression Statement  Pt continues to present as an atypical TBI pt or CVA pt with mild-moderate (more moderate) anomic aphasia per [employer] Aphasia Battery-Revised; cognitive deficits including visuospatial skills, organization, slow processing ID'd per CLQT. See "skilled intervention" for details on today's session. Per husband, pt working with therapy tasks in [employer] app 30 min daily. SLP recommends cont'd course of skilled ST to address pt's decr'd language-cogniton ability/possible verbal apraxia.to incr. pt independence    Speech Therapy Frequency  2x / week    Duration  --   8 weeks or 17 visits   Treatment/Interventions  SLP instruction and feedback;Compensatory strategies;Patient/family education;Multimodal communcation approach;Cueing hierarchy;Language facilitation;Functional tasks;Oral motor exercises;Internal/external aids;Environmental controls;Cognitive reorganization    Potential to  Achieve Goals  Fair    Potential Considerations  Severity of impairments       Patient will benefit from skilled therapeutic intervention in order to improve the following deficits and impairments:   Aphasia  Cognitive  communication deficit  Verbal apraxia    Problem List Patient Active Problem List   Diagnosis Date Noted  . Fluency disorder associated with underlying disease 02/08/2018  . Cognitive and neurobehavioral dysfunction 02/08/2018  . 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 Arthritis of carpometacarpal [facility]) joint of left thumb 01/24/2018  . Ankle fracture, left 09/03/2016  . Hand dysfunction 03/11/2016  . Symptomatic varicose veins, bilateral 03/11/2016  . Numbness of left thumb 03/11/2016  . Elevated BP 07/27/2014  . Bunion of left foot 07/26/2014  . Abnormality of gait 07/26/2014  . Cough 11/10/2013  . Post-nasal drainage 11/10/2013  . Abnormal urine odor 11/10/2013  . Visit for preventive health examination 07/28/2013  . Right anterior knee pain 07/22/2012  . Medicare annual wellness visit, subsequent 07/25/2011  . ANXIETY, SITUATIONAL 09/25/2009  . ARTHRITIS 09/25/2009  . Vitamin D deficiency 09/27/2008  . Hyperlipidemia 09/17/2008  . Elevated blood pressure reading without diagnosis of hypertension 09/17/2008  . COLONIC POLYPS, HX OF 09/17/2008  . PERSONAL HISTORY DISEASES SKIN&SUBCUT TISSUE 09/17/2008    Taleah Bellantoni, Annye Rusk MS, CCC-SLP 01/25/2019, 3:04 PM  [place] [address] [place], [place], [address] Phone: [phone]   Fax:  [phone]   Name: Manya Balash MRN: [id number] Date of Birth: 05-Oct-1940

## 2019-01-25 NOTE — Therapy (Signed)
[facility] [address] [place], [place], [address] Phone: [phone]   Fax:  [phone]  Occupational Therapy Treatment  Patient Details  Name: Pamela Alvarez MRN: [id number] Date of Birth: April 02, 1940 No data recorded  Encounter Date: 01/25/2019  OT End of Session - 01/25/19 1441    Visit Number  12    Number of Visits  21    Date for OT Re-Evaluation  02/25/19    Authorization Type  [employer] Medicare    Authorization - Visit Number  12    Authorization - Number of Visits  20    OT Start Time  1320    OT Stop Time  1400    OT Time Calculation (min)  40 min    Activity Tolerance  Patient tolerated treatment well    Behavior During Therapy  [facility] for tasks assessed/performed       Past Medical History:  Diagnosis Date  . Cyst 07-2008   Cyst on back x2 that were drained  . H/O echocardiogram 02-2000  . Hx of colonic polyps   . Hyperlipidemia   . Hypertension   . Varicose veins of both lower extremities     Past Surgical History:  Procedure Laterality Date  . POLYPECTOMY     Colon  . TONSILLECTOMY    . TUBAL LIGATION      There were no vitals filed for this visit.  Subjective Assessment - 01/25/19 1323    Patient is accompanied by:  Family member   husband [name])   Pertinent History  78 y.o female with recent declining cognitive abilities, per MRI -right frontal, right parietal and right cerebellar encephalomalacia and gliosis, possible embolic chronic ischemic infarcts.Pt has hx of ankle fx, hyperlipidemia, and thumb CMC arthritis. Per pt's husband MD is still trying to determine a definative diagnosis, per chart, possible PSP vs. TBI.    Limitations  fall risk    Patient Stated Goals  improved use of L hand, to be as independent as possible    Currently in Pain?  No/denies             Treatment: Cane exercises for shoulder flexion, chest press and shoulder abduction, with mod facilitation 10-20 reps  each. Writing activities with pt using coban wrapped pen to write name, and isolated letters of name. Mod v.c with improved performance following repetition. Placing various sized pegs into semi circle with LUE, min difficulty/ v.c Shape matching task on constant therapy, using LUE to select answers min-mod difficulty.                OT Short Term Goals - 01/19/19 1245      OT SHORT TERM GOAL #1   Title  Pt/ caregiver will be I with HEP.    Time  4    Period  Weeks    Status  Achieved    Target Date  12/22/18      OT SHORT TERM GOAL #2   Title  Pt/ husband will verbalize understanding of adapted strategies to maximize safety and independence with ADLs/ IADLs.    Time  4    Period  Weeks    Status  Achieved   Pt's husband reports ADLs are going well, he is using questioning cues     OT SHORT TERM GOAL #3   Title  Pt will demonstrate ability to retrieve a lightweight object at 100 shoulder flexion with LUE.    Time  4  Period  Weeks    Status  Achieved   100     OT SHORT TERM GOAL #4   Title  Pt will demonstrate ability to retrieve a lightweight object at 115 shoulder flexion with RUE.    Time  4    Period  Weeks    Status  Achieved   120       OT Long Term Goals - 01/19/19 1247      OT LONG TERM GOAL #1   Title  Pt will increase LUE funtional use and reach as eveidenced by increasing LUE box/ blocks to 15 blocks consistently.    Baseline  01/19/2019-13, 14 blocks    Time  5    Period  Weeks    Status  On-going   13 blocks,     OT LONG TERM GOAL #2   Title  Pt will increase LUE grip strength to 25 lbs or greater for increased ease with ADLS.    Baseline  19 lbs on 01/19/2019    Time  5    Period  Weeks    Status  On-going   19 lbs 01/19/2019     OT LONG TERM GOAL #3   Title  Pt will demonstrate ability to write her name legibly with good letter size x 3  trials.    Time  5    Period  Weeks    Status  On-going   Pt writes her name legibly at  times, inconsistent, pt does best if copying her name.     OT LONG TERM GOAL #4   Title  Pt will demonstrate ability to retrieve a lightweight object aty 105 shoulder flexion with LUE.    Time  --    Period  Weeks    Status  On-going   100*     OT LONG TERM GOAL #5   Title  Pt will consistently use LUE to assist with ADLs with no more than min v.c    Time  5    Period  Weeks    Status  New            Plan - 01/25/19 1441    Clinical Impression Statement  Pt is progressing towards goals. She demonstrates improving LUE functional use and coordination.    Occupational performance deficits (Please refer to evaluation for details):  ADL's;IADL's;Leisure;Work;Social Participation    Body Structure / Function / Physical Skills  ADL;UE functional use;Balance;Flexibility;Vision;FMC;ROM;Gait;Coordination;GMC;Sensation;Decreased knowledge of precautions;Decreased knowledge of use of DME;IADL;Strength;Dexterity;Mobility    Cognitive Skills  Attention;Learn;Memory;Problem Solve;Safety Awareness;Sequencing;Thought;Understand    Rehab Potential  Fair    OT Frequency  2x / week    OT Duration  6 weeks    OT Treatment/Interventions  Self-care/ADL training;Energy conservation;Visual/perceptual remediation/compensation;Patient/family education;DME and/or AE instruction;Aquatic Therapy;Paraffin;Passive range of motion;Balance training;Fluidtherapy;Cryotherapy;Building services engineerunctional Mobility Training;Therapeutic activities;Manual Therapy;Therapeutic exercise;Moist Heat;Neuromuscular education;Cognitive remediation/compensation    Plan  simple functional tasks, LUE forced use and attention to Lt side.    Consulted and Agree with Plan of Care  Patient;Family member/caregiver    Family Member Consulted  husband       Patient will benefit from skilled therapeutic intervention in order to improve the following deficits and impairments:   Body Structure / Function / Physical Skills: ADL, UE functional use, Balance,  Flexibility, Vision, FMC, ROM, Gait, Coordination, GMC, Sensation, Decreased knowledge of precautions, Decreased knowledge of use of DME, IADL, Strength, Dexterity, Mobility Cognitive Skills: Attention, Learn, Memory, Problem Solve, Safety Awareness, Sequencing, Thought, Understand  Visit Diagnosis: Muscle weakness (generalized)  Frontal lobe and executive function deficit  Attention and concentration deficit  Visuospatial deficit  Other lack of coordination    Problem List Patient Active Problem List   Diagnosis Date Noted  . Fluency disorder associated with underlying disease 02/08/2018  . Cognitive and neurobehavioral dysfunction 02/08/2018  . 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 Arthritis of carpometacarpal [facility]) joint of left thumb 01/24/2018  . Ankle fracture, left 09/03/2016  . Hand dysfunction 03/11/2016  . Symptomatic varicose veins, bilateral 03/11/2016  . Numbness of left thumb 03/11/2016  . Elevated BP 07/27/2014  . Bunion of left foot 07/26/2014  . Abnormality of gait 07/26/2014  . Cough 11/10/2013  . Post-nasal drainage 11/10/2013  . Abnormal urine odor 11/10/2013  . Visit for preventive health examination 07/28/2013  . Right anterior knee pain 07/22/2012  . Medicare annual wellness visit, subsequent 07/25/2011  . ANXIETY, SITUATIONAL 09/25/2009  . ARTHRITIS 09/25/2009  . Vitamin D deficiency 09/27/2008  . Hyperlipidemia 09/17/2008  . Elevated blood pressure reading without diagnosis of hypertension 09/17/2008  . COLONIC POLYPS, HX OF 09/17/2008  . PERSONAL HISTORY DISEASES SKIN&SUBCUT TISSUE 09/17/2008    RINE,KATHRYN 01/25/2019, 2:47 PM  [facility] [address] [place] [place], [place], [address] Phone: [phone]   Fax:  [phone]  Name: Velina Drollinger MRN: [id number] Date of Birth: [birth year]

## 2019-01-27 ENCOUNTER — Other Ambulatory Visit: Payer: Self-pay

## 2019-01-27 ENCOUNTER — Ambulatory Visit: Payer: Medicare Other

## 2019-01-27 ENCOUNTER — Ambulatory Visit: Payer: Medicare Other | Admitting: Physical Therapy

## 2019-01-27 ENCOUNTER — Ambulatory Visit: Payer: Medicare Other | Admitting: Occupational Therapy

## 2019-01-27 DIAGNOSIS — R4701 Aphasia: Secondary | ICD-10-CM

## 2019-01-27 DIAGNOSIS — R41842 Visuospatial deficit: Secondary | ICD-10-CM

## 2019-01-27 DIAGNOSIS — R2681 Unsteadiness on feet: Secondary | ICD-10-CM

## 2019-01-27 DIAGNOSIS — R278 Other lack of coordination: Secondary | ICD-10-CM

## 2019-01-27 DIAGNOSIS — R2689 Other abnormalities of gait and mobility: Secondary | ICD-10-CM

## 2019-01-27 DIAGNOSIS — R482 Apraxia: Secondary | ICD-10-CM

## 2019-01-27 DIAGNOSIS — M6281 Muscle weakness (generalized): Secondary | ICD-10-CM

## 2019-01-27 DIAGNOSIS — R41840 Attention and concentration deficit: Secondary | ICD-10-CM

## 2019-01-27 DIAGNOSIS — R41844 Frontal lobe and executive function deficit: Secondary | ICD-10-CM

## 2019-01-27 DIAGNOSIS — R41841 Cognitive communication deficit: Secondary | ICD-10-CM

## 2019-01-27 NOTE — Therapy (Signed)
[facility] [address] [place], [place], [address] Phone: [phone]   Fax:  [phone]  Speech Language Pathology Treatment  Patient Details  Name: Pamela Alvarez MRN: [id number] Date of Birth: 1940-07-03 Referring Provider (SLP): Dohmeier, Asencion Partridge, MD   Encounter Date: 01/27/2019  End of Session - 01/27/19 1314    Visit Number  16    Number of Visits  17    Date for SLP Re-Evaluation  02/20/19    SLP Start Time  1147    SLP Stop Time   3643    SLP Time Calculation (min)  43 min       Past Medical History:  Diagnosis Date  . Cyst 07-2008   Cyst on back x2 that were drained  . H/O echocardiogram 02-2000  . Hx of colonic polyps   . Hyperlipidemia   . Hypertension   . Varicose veins of both lower extremities     Past Surgical History:  Procedure Laterality Date  . POLYPECTOMY     Colon  . TONSILLECTOMY    . TUBAL LIGATION      There were no vitals filed for this visit.  Subjective Assessment - 01/27/19 1157    Subjective  Pt brought her tablet with her with Talk Path Therapy.    Patient is accompained by:  Family member   Chuck   Currently in Pain?  Yes            ADULT SLP TREATMENT - 01/27/19 1220      General Information   Behavior/Cognition  Alert;Cooperative;Pleasant mood      Treatment Provided   Treatment provided  Cognitive-Linquistic      Cognitive-Linquistic Treatment   Treatment focused on  Aphasia;Patient/family/caregiver education    Skilled Treatment  SLP joined pt in her practice on Talk Path Therapy (TPT) to enhance practice as well as provide cues for pt demonstrating impulsivity during the tasks. Pt husband was encouraged to assist pt during her practice with TPT and husband stated pt was upset when he assists. SLP "had direct conversation with pt about alowing her husband to assist as this will actually help her more than just her doing the tasks herself.        Assessment / Recommendations / Plan   Plan  Continue with current plan of care      Progression Toward Goals   Progression toward goals  Progressing toward goals       SLP Education - 01/27/19 1314    Education Details  allow husband to assist with TPT    Person(s) Educated  Patient;Spouse    Methods  Explanation    Comprehension  Verbalized understanding       SLP Short Term Goals - 01/25/19 1502      SLP SHORT TERM GOAL #1   Title  pt will demo ability for functionalality with simple household langauge tasks (e.g., grocery list, errand list, to-do list) over 3 sessions    Time  1    Period  Weeks    Status  Not Met      SLP SHORT TERM GOAL #2   Title  pt will achieve functional communication of wants/needs at home (reported, if needed) over two sessions    Time  1    Period  Weeks    Status  Not Met      SLP SHORT TERM GOAL #3   Title  pt will demo correct right/left comprehension in simple functional  commands 9/10 opportunities    Time  1    Period  Weeks    Status  Not Met      SLP SHORT TERM GOAL #4   Title  pt will complete full aphasia eval before total visit #3    Baseline  form 1 completed 12/06/18    Status  Achieved      SLP SHORT TERM GOAL #5   Title  pt will complete cognitive linguistic eval PRN if deemed clinically necessary    Baseline  started 12/29/18    Time  1    Period  Weeks    Status  Partially Met       SLP Long Term Goals - 01/27/19 1316      SLP LONG TERM GOAL #1   Title  pt will demonstrate improved verbal communication (as rated by pt) compared to when ST began    Period  Weeks    Status  Achieved      SLP LONG TERM GOAL #2   Title  pt will report speech and language is less frustrating than prior to initiation of ST    Time  2    Period  Weeks    Status  On-going      SLP LONG TERM GOAL #3   Title  pt will produce functional verbal expression in 3 minutes simple conversation    Time  2    Period  Weeks    Status  Revised    omitted "over three sessions"     SLP LONG TERM GOAL #4   Title  pt will use multimodal communication PRN to express wants/needs    Time  2    Period  Weeks    Status  On-going      SLP LONG TERM GOAL #5   Title  pt will functionally write her first name independently in 7/10 attempts over 3 sessions    Time  2    Period  Weeks    Status  On-going       Plan - 01/27/19 1315    Clinical Impression Statement  Pt continues to present as an atypical TBI pt or CVA pt with mild-moderate (more moderate) anomic aphasia per [employer] Aphasia Battery-Revised; cognitive deficits including visuospatial skills, organization, slow processing ID'd per CLQT. See "skilled intervention" for details on today's session. Pt is reported by husband to work with therapy tasks in [employer] app 30-40 min daily. SLP told pt today to allow husband to assist pt as impulsivity decr'd pt's participation with stimuli until SLP cues.  SLP recommends cont'd course of skilled ST to address pt's decr'd language-cogniton ability/possible verbal apraxia.to incr. pt independence    Speech Therapy Frequency  2x / week    Duration  --   8 weeks or 17 visits   Treatment/Interventions  SLP instruction and feedback;Compensatory strategies;Patient/family education;Multimodal communcation approach;Cueing hierarchy;Language facilitation;Functional tasks;Oral motor exercises;Internal/external aids;Environmental controls;Cognitive reorganization    Potential to Achieve Goals  Fair    Potential Considerations  Severity of impairments       Patient will benefit from skilled therapeutic intervention in order to improve the following deficits and impairments:   Aphasia  Cognitive communication deficit  Verbal apraxia    Problem List Patient Active Problem List   Diagnosis Date Noted  . Fluency disorder associated with underlying disease 02/08/2018  . Cognitive and neurobehavioral dysfunction 02/08/2018  . Supranuclear ocular  palsy 02/08/2018  . Arthritis of carpometacarpal (CMC) joint of  left thumb 01/24/2018  . Ankle fracture, left 09/03/2016  . Hand dysfunction 03/11/2016  . Symptomatic varicose veins, bilateral 03/11/2016  . Numbness of left thumb 03/11/2016  . Elevated BP 07/27/2014  . Bunion of left foot 07/26/2014  . Abnormality of gait 07/26/2014  . Cough 11/10/2013  . Post-nasal drainage 11/10/2013  . Abnormal urine odor 11/10/2013  . Visit for preventive health examination 07/28/2013  . Right anterior knee pain 07/22/2012  . Medicare annual wellness visit, subsequent 07/25/2011  . ANXIETY, SITUATIONAL 09/25/2009  . ARTHRITIS 09/25/2009  . Vitamin D deficiency 09/27/2008  . Hyperlipidemia 09/17/2008  . Elevated blood pressure reading without diagnosis of hypertension 09/17/2008  . COLONIC POLYPS, HX OF 09/17/2008  . PERSONAL HISTORY DISEASES SKIN&SUBCUT TISSUE 09/17/2008    [facility] ,[place], CCC-SLP  01/27/2019, 1:18 PM  [facility] [address] [place], [place], [address] Phone: [phone]   Fax:  [phone]   Name: Pamela Alvarez MRN: [id number] Date of Birth: 1940-05-09

## 2019-01-27 NOTE — Patient Instructions (Signed)
Please accept assistance from [place] at least a few times a week when he wants to help - it WILL help you!

## 2019-01-27 NOTE — Therapy (Signed)
[facility] [address] [place], [place], [address] Phone: [phone]   Fax:  [phone]  Occupational Therapy Treatment  Patient Details  Name: Pamela ApleySandra Alvarez MRN: [id number] Date of Birth: 09/21/1940 No data recorded  Encounter Date: 01/27/2019  OT End of Session - 01/27/19 1134    Visit Number  13    Number of Visits  21    Date for OT Re-Evaluation  02/25/19    Authorization Type  [employer]HC Medicare    Authorization - Visit Number  13    Authorization - Number of Visits  20    OT Start Time  1104    OT Stop Time  1143    OT Time Calculation (min)  39 min    Activity Tolerance  Patient tolerated treatment well    Behavior During Therapy  [facility] for [facility] assessed/performed       Past Medical History:  Diagnosis Date  . Cyst 07-2008   Cyst on back x2 that were drained  . H/O echocardiogram 02-2000  . Hx of colonic polyps   . Hyperlipidemia   . Hypertension   . Varicose veins of both lower extremities     Past Surgical History:  Procedure Laterality Date  . POLYPECTOMY     Colon  . TONSILLECTOMY    . TUBAL LIGATION      There were no vitals filed for this visit.  Subjective Assessment - 01/27/19 1106    Patient is accompanied by:  Family member   husband Pamela Diamond(Chuck)   Pertinent History  78 y.o female with recent declining cognitive abilities, per MRI -right frontal, right parietal and right cerebellar encephalomalacia and gliosis, possible embolic chronic ischemic infarcts.Pt has hx of ankle fx, hyperlipidemia, and thumb CMC arthritis. Per pt's husband MD is still trying to determine a definative diagnosis, per chart, possible PSP vs. TBI.    Limitations  fall risk    Patient Stated Goals  improved use of L hand, to be as independent as possible    Currently in Pain?  Yes    Pain Score  --   unable to rate   Pain Location  Hip    Pain Descriptors / Indicators  Aching    Pain Type  Acute pain    Pain Onset   Today    Pain Frequency  Intermittent    Aggravating Factors   sitting on mat    Pain Relieving Factors  sitting in chair             Treatment: Pt verbally requested to move from the mat to a chair reporting pain sitting on the mat. Seated at table completing a 12 piece puzzle with bilateral UE's, mod-max v.c for performance Writing activity to trace capital letters with coban wrap around pen, min-mod v.c for letter formation. Then writing her name, legibility with good letter size. Placing large pegs into pegboard with LUE to follow verbal instructions for cognitive component, min verbal cues for correct design and LUE use.                OT Short Term Goals - 01/19/19 1245      OT SHORT TERM GOAL #1   Title  Pt/ caregiver will be I with HEP.    Time  4    Period  Weeks    Status  Achieved    Target Date  12/22/18      OT SHORT TERM GOAL #2   Title  Pt/ husband will verbalize understanding of adapted strategies to maximize safety and independence with ADLs/ IADLs.    Time  4    Period  Weeks    Status  Achieved   Pt's husband reports ADLs are going well, he is using questioning cues     OT SHORT TERM GOAL #3   Title  Pt will demonstrate ability to retrieve a lightweight object at 100 shoulder flexion with LUE.    Time  4    Period  Weeks    Status  Achieved   100     OT SHORT TERM GOAL #4   Title  Pt will demonstrate ability to retrieve a lightweight object at 115 shoulder flexion with RUE.    Time  4    Period  Weeks    Status  Achieved   120       OT Long Term Goals - 01/19/19 1247      OT LONG TERM GOAL #1   Title  Pt will increase LUE funtional use and reach as eveidenced by increasing LUE box/ blocks to 15 blocks consistently.    Baseline  01/19/2019-13, 14 blocks    Time  5    Period  Weeks    Status  On-going   13 blocks,     OT LONG TERM GOAL #2   Title  Pt will increase LUE grip strength to 25 lbs or greater for increased ease  with ADLS.    Baseline  19 lbs on 01/19/2019    Time  5    Period  Weeks    Status  On-going   19 lbs 01/19/2019     OT LONG TERM GOAL #3   Title  Pt will demonstrate ability to write her name legibly with good letter size x 3  trials.    Time  5    Period  Weeks    Status  On-going   Pt writes her name legibly at times, inconsistent, pt does best if copying her name.     OT LONG TERM GOAL #4   Title  Pt will demonstrate ability to retrieve a lightweight object aty 105 shoulder flexion with LUE.    Time  --    Period  Weeks    Status  On-going   100*     OT LONG TERM GOAL #5   Title  Pt will consistently use LUE to assist with ADLs with no more than min v.c    Time  5    Period  Weeks    Status  New              Patient will benefit from skilled therapeutic intervention in order to improve the following deficits and impairments:           Visit Diagnosis: Muscle weakness (generalized)  Frontal lobe and executive function deficit  Attention and concentration deficit  Visuospatial deficit  Other lack of coordination    Problem List Patient Active Problem List   Diagnosis Date Noted  . Fluency disorder associated with underlying disease 02/08/2018  . Cognitive and neurobehavioral dysfunction 02/08/2018  . 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 Arthritis of carpometacarpal [facility]) joint of left thumb 01/24/2018  . Ankle fracture, left 09/03/2016  . Hand dysfunction 03/11/2016  . Symptomatic varicose veins, bilateral 03/11/2016  . Numbness of left thumb 03/11/2016  . Elevated BP 07/27/2014  . Bunion of left foot 07/26/2014  . Abnormality of gait 07/26/2014  .  Cough 11/10/2013  . Post-nasal drainage 11/10/2013  . Abnormal urine odor 11/10/2013  . Visit for preventive health examination 07/28/2013  . Right anterior knee pain 07/22/2012  . Medicare annual wellness visit, subsequent 07/25/2011  . ANXIETY, SITUATIONAL 09/25/2009  . ARTHRITIS 09/25/2009   . Vitamin D deficiency 09/27/2008  . Hyperlipidemia 09/17/2008  . Elevated blood pressure reading without diagnosis of hypertension 09/17/2008  . COLONIC POLYPS, HX OF 09/17/2008  . PERSONAL HISTORY DISEASES SKIN&SUBCUT TISSUE 09/17/2008    Tyrhonda Georgiades 01/27/2019, 11:41 AM  [facility] [address] [place], [place], [address] Phone: [phone]   Fax:  [phone]  Name: Magdalyn Arenivas MRN: [id number] Date of Birth: 1940-07-14

## 2019-01-29 ENCOUNTER — Encounter: Payer: Self-pay | Admitting: Physical Therapy

## 2019-01-29 NOTE — Therapy (Signed)
[facility] [address] [place], [place], [address] Phone: [phone]   Fax:  [phone]  Physical Therapy Treatment  Patient Details  Name: Pamela Alvarez MRN: [id number] Date of Birth: November 11, 1940 Referring Provider (PT): Dohmeier, Asencion Partridge   Encounter Date: 01/27/2019  PT End of Session - 01/29/19 0641    Visit Number  16    Number of Visits  24   per recert 66/44/0347   Date for PT Re-Evaluation  03/03/19    Authorization Type  [employer] Medicare-will need 10th visit progress note    PT Start Time  1018    PT Stop Time  1058    PT Time Calculation (min)  40 min    Equipment Utilized During Treatment  Gait belt    Activity Tolerance  Patient tolerated treatment well    Behavior During Therapy  [facility] for tasks assessed/performed       Past Medical History:  Diagnosis Date  . Cyst 07-2008   Cyst on back x2 that were drained  . H/O echocardiogram 02-2000  . Hx of colonic polyps   . Hyperlipidemia   . Hypertension   . Varicose veins of both lower extremities     Past Surgical History:  Procedure Laterality Date  . POLYPECTOMY     Colon  . TONSILLECTOMY    . TUBAL LIGATION      There were no vitals filed for this visit.  Subjective Assessment - 01/29/19 0630    Subjective  No changes.    Patient is accompained by:  Family member    Patient Stated Goals  Pt's goal for therapy is better balance and walking.    Currently in Pain?  No/denies                       [facility] Adult PT Treatment/Exercise - 01/29/19 0631      Transfers   Transfers  Sit to Stand;Stand to Sit    Sit to Stand  5: Supervision;4: Min guard;With upper extremity assist;From bed;From chair/3-in-1    Sit to Stand Details  Verbal cues for sequencing;Verbal cues for technique    Stand to Sit  5: Supervision;With upper extremity assist;To bed;To chair/3-in-1    Stand to Sit Details (indicate cue type and reason)  Verbal cues for  technique;Verbal cues for sequencing    Stand to Sit Details  Continues to need cues to fully turn prior to sitting     Number of Reps  10 reps   throughout session     Ambulation/Gait   Ambulation/Gait  Yes    Ambulation/Gait Assistance  4: Min guard    Ambulation Distance (Feet)  90 Feet   x 2   Assistive device  Rollator    Gait Pattern  Step-through pattern;Decreased step length - right;Decreased step length - left;Shuffle;Decreased dorsiflexion - right;Decreased dorsiflexion - left    Ambulation Surface  Level;Indoor    Gait velocity  23.56 sec with rollator= 1.39 ft/sec   busy gym area, attempting 10 M twice (pt distracted)   Stairs  Yes    Stairs Assistance  4: Min guard    Stairs Assistance Details (indicate cue type and reason)  Ascends steps step-through pattern with R handrail; descends with step-to pattern, facing sideways with the one rail, almost crossover pattern with feet.  (Husband reports he is always with her for garage steps at home).      Berg Balance Test   Sit to  Stand  Able to stand  independently using hands    Standing Unsupported  Able to stand 2 minutes with supervision    Sitting with Back Unsupported but Feet Supported on Floor or Stool  Able to sit safely and securely 2 minutes    Stand to Sit  Controls descent by using hands    Transfers  Able to transfer with verbal cueing and /or supervision    Standing Unsupported with Eyes Closed  Able to stand 10 seconds with supervision    Standing Ubsupported with Feet Together  Able to place feet together independently and stand for 1 minute with supervision    From Standing, Reach Forward with Outstretched Arm  Can reach forward >12 cm safely (5")   6"   From Standing Position, Pick up Object from Floor  Able to pick up shoe, needs supervision    From Standing Position, Turn to Look Behind Over each Shoulder  Looks behind one side only/other side shows less weight shift    Turn 360 Degrees  Needs close  supervision or verbal cueing    Standing Unsupported, Alternately Place Feet on Step/Stool  Able to complete >2 steps/needs minimal assist    Standing Unsupported, One Foot in Front  Able to plae foot ahead of the other independently and hold 30 seconds    Standing on One Leg  Tries to lift leg/unable to hold 3 seconds but remains standing independently    Total Score  36      Timed Up and Go Test   TUG  Normal TUG    Normal TUG (seconds)  31.19   30.38, 31.25 sec; no device, min guard     Self-Care   Self-Care  Other Self-Care Comments    Other Self-Care Comments   Discussed fall prevention in home environment.Discussed progress towards goals and plans to continue PT.  Discussed pt and husband's goals, reiterating pt's initial goals for imrpoved balance and gait.  Pt and husband report improved bed mobility and improved low surface transfers as well.      Therapeutic Activites    Therapeutic Activities  Other Therapeutic Activities    Other Therapeutic Activities  Assessed bed mobility:  Pt goes sit to supine with min guard, lying down towards L side (ends up diagonal on the bed, and then has to scoot towards pillow, as she doesn't go through sidelying).  For supine>sit, pt comes through sidelying, needing min/mod assistance for hand placement, bringing upper body up to sitting.             PT Education - 01/29/19 0640    Education Details  Progress towards goals, POC; fall prevention education in home environment    Person(s) Educated  Patient;Spouse    Methods  Explanation;Handout    Comprehension  Verbalized understanding       PT Short Term Goals - 12/29/18 1103      PT SHORT TERM GOAL #1   Title  Pt will perform HEP with family supervision for improved transfers, balance, and gait.  UPDATED TARGET 12/30/2018 ( due to later start with later scheduling)    Time  4    Period  Weeks    Status  Achieved      PT SHORT TERM GOAL #2   Title  Pt will perform 4 of 5 reps of  sit<>stand transfers with correct technique, with husband supervision and cues, for improved safety with transfers.    Time  4    Period  Weeks    Status  Achieved      PT SHORT TERM GOAL #3   Title  Pt will improve Berg Balance score to at least 24/56 for decreased fall risk.    Baseline  31/56 12/27/2018    Time  4    Period  Weeks    Status  Achieved      PT SHORT TERM GOAL #4   Title  Pt will improve TUG score to less than or equal to 23 seconds for decreased fall risk.    Baseline  10/22:  25.68 sec, 25.88 sec    Time  4    Period  Weeks    Status  Not Met      PT SHORT TERM GOAL #5   Title  Pt/husband will verbalize understanding of fall prevention in home environment.    Time  4    Period  Weeks    Status  Achieved        PT Long Term Goals - 01/27/19 1049      PT LONG TERM GOAL #1   Title  Pt will perform updated HEP for balance, gait, with husband's supervision.  UPDATED TARGET 01/27/2019    Baseline  walking program    Time  4   PER RECERT 53/61/4431   Period  Weeks    Status  Achieved      PT LONG TERM GOAL #2   Title  Pt will improve gait velocity to at  least 1.5 ft/sec with appropriate device and supervision, for improved gait efficiency.    Baseline  1.73 ft/sec 01/25/2019    Time  4    Period  Weeks    Status  Achieved      PT LONG TERM GOAL #3   Title  Pt/husband will verbalize/demonstrate understanding of appropriate floor>stand transfers for safe fall recovery.    Time  4    Period  Weeks    Status  Not Met      PT LONG TERM GOAL #4   Title  Pt will improve Berg balance score to at least 38/56 for decreased fall risk.    Time  4    Period  Weeks    Status  Partially Met            Plan - 01/29/19 0644    Clinical Impression Statement  Assessed remaining LTGs, with pt meeting LTG for HEP(pt doing OTAGO and walking program-up to 8 min of rollator walking with supervision).  LTG 2 met last visit for gait velocity (HHA/no device); with  rollator today, gait velocity 1.35 ft/sec.  LTG 3 not met, as when floor transfers have bee attempted in previous session, pt refuses to work on those due to fear.  LTG 4 partially met, as Berg score has improved from 31/56 to 36/56, just not to goal level.  Pt is making slow and steady progress with therapy; husband is present for sessions to help with carryover and provide supervision for home.  They have identified additional goals of bed mobility and low surface transfers to address.  Pt will benefit from further skilled PT to address strength, balance, gait, and overall functional mobility for continued decreased fall risk and improved mobility.    Personal Factors and Comorbidities  Comorbidity 3+    Comorbidities  PMH includes ankle fracture, HTN, hyperlipidemia, recurrent falls    Examination-Activity Limitations  Squat;Stairs;Stand;Transfers;Locomotion Level;Sit    Examination-Participation Restrictions  Community Activity    Stability/Clinical  Decision Making  Evolving/Moderate complexity    Rehab Potential  Fair   MMSE score 16/30; good family support   PT Frequency  2x / week    PT Duration  4 weeks   per recert 13/10/6576   PT Treatment/Interventions  ADLs/Self Care Home Management;Gait training;DME Instruction;Neuromuscular re-education;Balance training;Therapeutic exercise;Therapeutic activities;Functional mobility training;Patient/family education    PT Next Visit Plan  Bed mobility, lower surface transfers, stair negotiation; continue to focus on SLS activities, posterior direction stepping/weightshifting    Consulted and Agree with Plan of Care  Patient;Family member/caregiver    Family Member Consulted  Husband       Patient will benefit from skilled therapeutic intervention in order to improve the following deficits and impairments:  Abnormal gait, Difficulty walking, Decreased safety awareness, Decreased balance, Decreased mobility  Visit Diagnosis: Other abnormalities of  gait and mobility  Unsteadiness on feet  Muscle weakness (generalized)     Problem List Patient Active Problem List   Diagnosis Date Noted  . Fluency disorder associated with underlying disease 02/08/2018  . Cognitive and neurobehavioral dysfunction 02/08/2018  . 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 Arthritis of carpometacarpal [facility]) joint of left thumb 01/24/2018  . Ankle fracture, left 09/03/2016  . Hand dysfunction 03/11/2016  . Symptomatic varicose veins, bilateral 03/11/2016  . Numbness of left thumb 03/11/2016  . Elevated BP 07/27/2014  . Bunion of left foot 07/26/2014  . Abnormality of gait 07/26/2014  . Cough 11/10/2013  . Post-nasal drainage 11/10/2013  . Abnormal urine odor 11/10/2013  . Visit for preventive health examination 07/28/2013  . Right anterior knee pain 07/22/2012  . Medicare annual wellness visit, subsequent 07/25/2011  . ANXIETY, SITUATIONAL 09/25/2009  . ARTHRITIS 09/25/2009  . Vitamin D deficiency 09/27/2008  . Hyperlipidemia 09/17/2008  . Elevated blood pressure reading without diagnosis of hypertension 09/17/2008  . COLONIC POLYPS, HX OF 09/17/2008  . PERSONAL HISTORY DISEASES SKIN&SUBCUT TISSUE 09/17/2008    Oreatha Fabry W. 01/29/2019, 6:50 AM  Frazier Butt., PT   [facility] [address] [place], [place], [address] Phone: [phone]   Fax:  [phone]  Name: Eilis Chestnutt MRN: [id number] Date of Birth: May 24, 1940

## 2019-01-30 ENCOUNTER — Ambulatory Visit: Payer: Medicare Other | Admitting: Physical Therapy

## 2019-01-31 ENCOUNTER — Ambulatory Visit: Payer: Medicare Other | Admitting: Speech Pathology

## 2019-01-31 ENCOUNTER — Other Ambulatory Visit: Payer: Self-pay

## 2019-01-31 ENCOUNTER — Encounter: Payer: Medicare Other | Admitting: Occupational Therapy

## 2019-01-31 ENCOUNTER — Ambulatory Visit: Payer: Medicare Other | Admitting: Physical Therapy

## 2019-01-31 DIAGNOSIS — M6281 Muscle weakness (generalized): Secondary | ICD-10-CM

## 2019-01-31 DIAGNOSIS — R4701 Aphasia: Secondary | ICD-10-CM

## 2019-01-31 DIAGNOSIS — R41841 Cognitive communication deficit: Secondary | ICD-10-CM

## 2019-01-31 DIAGNOSIS — R2681 Unsteadiness on feet: Secondary | ICD-10-CM

## 2019-01-31 DIAGNOSIS — R482 Apraxia: Secondary | ICD-10-CM

## 2019-01-31 NOTE — Therapy (Signed)
[facility] [address] [place], [place], [address] Phone: [phone]   Fax:  [phone]  Speech Language Pathology Treatment  Patient Details  Name: Pamela Alvarez MRN: [id number] Date of Birth: [birth year] Referring Provider (SLP): Dohmeier, Asencion Partridge, MD   Encounter Date: 01/31/2019  End of Session - 01/31/19 1125    Visit Number  17    Number of Visits  33    Date for SLP Re-Evaluation  05/01/19   90 days   SLP Start Time  29    SLP Stop Time   1105    SLP Time Calculation (min)  46 min    Activity Tolerance  Patient tolerated treatment well       Past Medical History:  Diagnosis Date  . Cyst 07-2008   Cyst on back x2 that were drained  . H/O echocardiogram 02-2000  . Hx of colonic polyps   . Hyperlipidemia   . Hypertension   . Varicose veins of both lower extremities     Past Surgical History:  Procedure Laterality Date  . POLYPECTOMY     Colon  . TONSILLECTOMY    . TUBAL LIGATION      There were no vitals filed for this visit.  Subjective Assessment - 01/31/19 1021    Subjective  "I didn't," (pt, re: letting husband assist with TalkPath exercises    Patient is accompained by:  Family member   Chuck   Currently in Pain?  No/denies            ADULT SLP TREATMENT - 01/31/19 1120      General Information   Behavior/Cognition  Alert;Cooperative;Pleasant mood      Treatment Provided   Treatment provided  Cognitive-Linquistic      Pain Assessment   Pain Assessment  No/denies pain      Cognitive-Linquistic Treatment   Treatment focused on  Aphasia;Patient/family/caregiver education    Skilled Treatment  Based on pt's "s" statement, SLP worked with pt in [employer] on [employer]. SLP provided cues to reduce impulsivity, usual cues required initially fading to occasional min A. Usual questioning cues for pt to double check her responses. Pt accuracy much improved with  slower rate. Encouraged pt to allow her husband to assist in this manner so that she gains maximal benefit from practice at home. SLP engaged pt in conversation re: family visit. Targeted conversational sentences in structured task; pt used key words from question to formulate 6-10 word responses with occasional written cues.       Assessment / Recommendations / Plan   Plan  Goals updated      Progression Toward Goals   Progression toward goals  Progressing toward goals       SLP Education - 01/31/19 1130    Education Details  how to assist with TPT, frame responses using parts of the question    Person(s) Educated  Patient;Spouse    Methods  Explanation    Comprehension  Verbalized understanding       SLP Short Term Goals - 01/31/19 1128      SLP SHORT TERM GOAL #1   Title  pt will demo ability for functionalality with simple household langauge tasks (e.g., grocery list, errand list, to-do list) over 3 sessions    Time  1    Period  Weeks    Status  Not Met      SLP SHORT TERM GOAL #2   Title  pt will achieve  functional communication of wants/needs at home (reported, if needed) over two sessions    Time  1    Period  Weeks    Status  Not Met      SLP SHORT TERM GOAL #3   Title  pt will demo correct right/left comprehension in simple functional commands 9/10 opportunities    Time  1    Period  Weeks    Status  Not Met      SLP SHORT TERM GOAL #4   Title  pt will complete full aphasia eval before total visit #3    Baseline  form 1 completed 12/06/18    Status  Achieved      SLP SHORT TERM GOAL #5   Title  pt will complete cognitive linguistic eval PRN if deemed clinically necessary    Baseline  started 12/29/18    Time  1    Period  Weeks    Status  Partially Met       SLP Long Term Goals - 01/31/19 1128      SLP LONG TERM GOAL #1   Title  pt will demonstrate improved verbal communication (as rated by pt) compared to when ST began    [place] #2   Title  pt will report speech and language is less frustrating than prior to initiation of ST    Time  1    Period  Weeks    Status  On-going      SLP LONG TERM GOAL #3   Title  pt will produce functional verbal expression in 3 minutes simple conversation    Time  4   renewed 11/24   Period  Weeks    Status  Revised   omitted "over three sessions"     SLP LONG TERM GOAL #4   Title  pt will use multimodal communication PRN to express wants/needs    Time  4   renewed 11/24   Period  Weeks    Status  On-going      SLP LONG TERM GOAL #5   Title  pt will functionally write her first name independently in 7/10 attempts over 3 sessions    Time  4   renewed 11/24   Period  Weeks    Status  On-going       Plan - 01/31/19 1126    Clinical Impression Statement  Pt continues to present as an atypical TBI pt or CVA pt with mild-moderate (more moderate) anomic aphasia per Western Aphasia Battery-Revised; cognitive deficits including visuospatial skills, organization, slow processing ID'd per CLQT. See "skilled intervention" for details on today's session. Pt is reported by husband to work with therapy tasks in [employer] app 30-40 min daily. SLP told pt today to allow husband to assist pt as impulsivity decr'd pt's participation with stimuli until SLP cues. Husband reports pt responding in sentences more frequently and continues to increase communication abilities with family members. SLP recommends cont'd course of skilled ST to address pt's decr'd language-cogniton ability/possible verbal apraxia to incr. pt independence.    Speech Therapy Frequency  2x / week    Duration  --   8 weeks or 17 visits   Treatment/Interventions  SLP instruction and feedback;Compensatory strategies;Patient/family education;Multimodal communcation approach;Cueing hierarchy;Language facilitation;Functional tasks;Oral motor exercises;Internal/external aids;Environmental  controls;Cognitive reorganization    Potential to Achieve Goals  Fair    Potential Considerations  Severity of impairments       Patient will benefit from skilled therapeutic intervention in order to improve the following deficits and impairments:   Aphasia  Cognitive communication deficit  Verbal apraxia    Problem List Patient Active Problem List   Diagnosis Date Noted  . Fluency disorder associated with underlying disease 02/08/2018  . Cognitive and neurobehavioral dysfunction 02/08/2018  . 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 Arthritis of carpometacarpal [facility]) joint of left thumb 01/24/2018  . Ankle fracture, left 09/03/2016  . Hand dysfunction 03/11/2016  . Symptomatic varicose veins, bilateral 03/11/2016  . Numbness of left thumb 03/11/2016  . Elevated BP 07/27/2014  . Bunion of left foot 07/26/2014  . Abnormality of gait 07/26/2014  . Cough 11/10/2013  . Post-nasal drainage 11/10/2013  . Abnormal urine odor 11/10/2013  . Visit for preventive health examination 07/28/2013  . Right anterior knee pain 07/22/2012  . Medicare annual wellness visit, subsequent 07/25/2011  . ANXIETY, SITUATIONAL 09/25/2009  . ARTHRITIS 09/25/2009  . Vitamin D deficiency 09/27/2008  . Hyperlipidemia 09/17/2008  . Elevated blood pressure reading without diagnosis of hypertension 09/17/2008  . COLONIC POLYPS, HX OF 09/17/2008  . PERSONAL HISTORY DISEASES SKIN&SUBCUT TISSUE 09/17/2008   Deneise Lever, [place], CCC-SLP Speech-Language Pathologist  Aliene Altes 01/31/2019, 11:31 AM  [facility] [address] [place], [place], [address] Phone: [phone]   Fax:  [phone]   Name: Pamela Alvarez MRN: [id number] Date of Birth: 1940/10/02

## 2019-01-31 NOTE — Therapy (Signed)
[place] [address] [place], [place], [address] Phone: [phone]   Fax:  [phone]  Physical Therapy Treatment  Patient Details  Name: Pamela Alvarez MRN: [id number] Date of Birth: 01-May-1940 Referring Provider (PT): Dohmeier, Asencion Partridge   Encounter Date: 01/31/2019  PT End of Session - 01/31/19 1254    Visit Number  17    Number of Visits  24   per recert 93/81/0175   Date for PT Re-Evaluation  03/03/19    Authorization Type  [employer] Medicare-will need 10th visit progress note    PT Start Time  0932    PT Stop Time  1015    PT Time Calculation (min)  43 min    Activity Tolerance  Patient tolerated treatment well    Behavior During Therapy  [facility] for tasks assessed/performed       Past Medical History:  Diagnosis Date  . Cyst 07-2008   Cyst on back x2 that were drained  . H/O echocardiogram 02-2000  . Hx of colonic polyps   . Hyperlipidemia   . Hypertension   . Varicose veins of both lower extremities     Past Surgical History:  Procedure Laterality Date  . POLYPECTOMY     Colon  . TONSILLECTOMY    . TUBAL LIGATION      There were no vitals filed for this visit.  Subjective Assessment - 01/31/19 0937    Subjective  No changes, no pain.    Patient is accompained by:  Family member    Patient Stated Goals  Pt's goal for therapy is better balance and walking.    Currently in Pain?  No/denies                       [facility] Adult PT Treatment/Exercise - 01/31/19 0938      Transfers   Transfers  Sit to Stand;Stand to Sit    Sit to Stand  5: Supervision;4: Min guard;With upper extremity assist;From bed;From chair/3-in-1    Sit to Stand Details  Verbal cues for sequencing;Verbal cues for technique;Tactile cues for weight shifting    Stand to Sit  5: Supervision;With upper extremity assist;To bed;To chair/3-in-1    Stand to Sit Details (indicate cue type and reason)  Verbal cues for  technique;Verbal cues for sequencing;Tactile cues for weight shifting    Stand to Sit Details  Continues to need cues to fully turn to sit; cues to slowly squat to sit    Number of Reps  Other reps (comment)   5 reps from 22" mat, 16" chair, 18" BOSU topped block   Transfer Cueing  Working on transfers to progressively lower and softer surfaces, to simulate from sofa at home.  Pt responds well to cues for rocking to scoot forward towards edge, then check foot placement (tucked and feet flat); forward lean on the count of 3 with min guard/min assist for sit to stand.      Therapeutic Activites    Therapeutic Activities  Other Therapeutic Activities    Other Therapeutic Activities  At higher bed (28") simulating bed at home, husband/pt demo how they get into the bed.  Pt typically sits at edge and lies back (diagonally on bed) and husband assists with feet and scoots her into supine position.  For supine>sit, pt holds husband's hand and he pulls her around to side edge of bed.  PT attempted to work through sit>sidelying/supine with min assist for foot placement; supine>sidelying>sit  with min assist for upper body to push up to sit.  Had to utilize 2" block in order to step into the bed to work on scooting back into bed.  Discussed that bed mobility may need more practice, and overall, pt may be able to do more/husband do less, just need to make sure it is being done safely..             PT Education - 01/31/19 1253    Education Details  Instructions for transfers, getting off of sofa; pt trying to do more of bed mobility (with husband providing assistance/supervision, just less overall assistance)    Person(s) Educated  Patient;Spouse    Methods  Explanation;Demonstration    Comprehension  Verbalized understanding;Returned demonstration;Verbal cues required;Need further instruction          PT Long Term Goals - 01/29/19 7867      PT LONG TERM GOAL #1   Title  Pt/husband will report  improvement in bed mobility sequencing and decreased assistance with supine>sit to minimal assistance, for improved safety with bed mobility.  TARGET 03/02/2019    Time  4    Period  Weeks    Status  New      PT LONG TERM GOAL #2   Title  Pt will improve gait velocity to at  least 1.5 ft/sec with rollator and supervision, for improved gait efficiency.    Baseline  1.73 ft/sec 01/25/2019 (no device); rollator 1.35 ft/sec    Time  4    Period  Weeks    Status  Revised      PT LONG TERM GOAL #3   Title  Pt will perform sit<>stand transfers from surfaces <18", simulating sofa, with verbal cues and minimal assistance or less, for improved transfers at home.    Time  4    Period  Weeks    Status  New      PT LONG TERM GOAL #4   Title  Pt will negotiate 4 steps with 1 handrial, with forward descending steps, with min guard assistance.    Time  4    Period  Weeks    Status  New      PT LONG TERM GOAL #5   Title  Pt/husband will verbalize plan for continued optimal fitness upon d/c from PT.    Time  4    Period  Weeks    Status  New            Plan - 01/31/19 1256    Clinical Impression Statement  Worked on varied surfaces transfers today, with focus on simulating lower, softer surfaces like her sofa at home.  With consistent cues and repetition of steps for correct sequence, pt is able to perform from low, soft surface with min guard/minimal assistance.  Also addressed bed mobility, with pt currently typically relying on her husband to assist with bed mobility.  Will continue to address towards updated LTGs.    Personal Factors and Comorbidities  Comorbidity 3+    Comorbidities  PMH includes ankle fracture, HTN, hyperlipidemia, recurrent falls    Examination-Activity Limitations  Squat;Stairs;Stand;Transfers;Locomotion Level;Sit    Examination-Participation Restrictions  Community Activity    Stability/Clinical Decision Making  Evolving/Moderate complexity    Rehab Potential  Fair    MMSE score 16/30; good family support   PT Frequency  2x / week    PT Duration  4 weeks   per recert 01/27/2019   PT Treatment/Interventions  ADLs/Self Care Home  Management;Gait training;DME Instruction;Neuromuscular re-education;Balance training;Therapeutic exercise;Therapeutic activities;Functional mobility training;Patient/family education    PT Next Visit Plan  Continue bed mobility, lower surface transfers, stair negotiation; continue to focus on SLS activities, posterior direction stepping/weightshifting; pt mentioned telehealth for environmental assessment of home for any home safety recommendations    Consulted and Agree with Plan of Care  Patient;Family member/caregiver    Family Member Consulted  Husband       Patient will benefit from skilled therapeutic intervention in order to improve the following deficits and impairments:  Abnormal gait, Difficulty walking, Decreased safety awareness, Decreased balance, Decreased mobility  Visit Diagnosis: Muscle weakness (generalized)  Unsteadiness on feet     Problem List Patient Active Problem List   Diagnosis Date Noted  . Fluency disorder associated with underlying disease 02/08/2018  . Cognitive and neurobehavioral dysfunction 02/08/2018  . 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 Arthritis of carpometacarpal [facility] joint of left thumb 01/24/2018  . Ankle fracture, left 09/03/2016  . Hand dysfunction 03/11/2016  . Symptomatic varicose veins, bilateral 03/11/2016  . Numbness of left thumb 03/11/2016  . Elevated BP 07/27/2014  . Bunion of left foot 07/26/2014  . Abnormality of gait 07/26/2014  . Cough 11/10/2013  . Post-nasal drainage 11/10/2013  . Abnormal urine odor 11/10/2013  . Visit for preventive health examination 07/28/2013  . Right anterior knee pain 07/22/2012  . Medicare annual wellness visit, subsequent 07/25/2011  . ANXIETY, SITUATIONAL 09/25/2009  . ARTHRITIS 09/25/2009  . Vitamin D deficiency 09/27/2008   . Hyperlipidemia 09/17/2008  . Elevated blood pressure reading without diagnosis of hypertension 09/17/2008  . COLONIC POLYPS, HX OF 09/17/2008  . PERSONAL HISTORY DISEASES SKIN&SUBCUT TISSUE 09/17/2008    , W. 01/31/2019, 12:59 PM  Gean Maidens, W., PT  [name] [address] [place], [place], [address] Phone: [phone]   Fax:  [phone]  Name: Pamela Alvarez MRN: [id number] Date of Birth: 08/05/1940

## 2019-01-31 NOTE — Patient Instructions (Signed)
For getting off of sofa:  1-Scoot Forward to edge of sofa (rock side to side to "walk" your bottom forward towards edge of sofa)  2-Make sure your feet are TUCKED and FLAT on the ground  3-Lean forward, NOSE OVER YOUR TOES, for a 3-COUNT, then stand up with BIG effort  Your husband may need to give you an initial boost off the edge of the sofa

## 2019-02-06 ENCOUNTER — Ambulatory Visit: Payer: Medicare Other | Admitting: Physical Therapy

## 2019-02-07 ENCOUNTER — Ambulatory Visit: Payer: Medicare Other | Admitting: Speech Pathology

## 2019-02-07 ENCOUNTER — Encounter: Payer: Self-pay | Admitting: Physical Therapy

## 2019-02-07 ENCOUNTER — Ambulatory Visit: Payer: Medicare Other | Attending: Neurology | Admitting: Physical Therapy

## 2019-02-07 ENCOUNTER — Ambulatory Visit: Payer: Medicare Other | Admitting: Occupational Therapy

## 2019-02-07 ENCOUNTER — Other Ambulatory Visit: Payer: Self-pay

## 2019-02-07 DIAGNOSIS — R278 Other lack of coordination: Secondary | ICD-10-CM | POA: Diagnosis present

## 2019-02-07 DIAGNOSIS — M6281 Muscle weakness (generalized): Secondary | ICD-10-CM

## 2019-02-07 DIAGNOSIS — R2689 Other abnormalities of gait and mobility: Secondary | ICD-10-CM

## 2019-02-07 DIAGNOSIS — R2681 Unsteadiness on feet: Secondary | ICD-10-CM | POA: Diagnosis present

## 2019-02-07 DIAGNOSIS — R4701 Aphasia: Secondary | ICD-10-CM | POA: Insufficient documentation

## 2019-02-07 DIAGNOSIS — R41841 Cognitive communication deficit: Secondary | ICD-10-CM | POA: Diagnosis present

## 2019-02-07 DIAGNOSIS — R41840 Attention and concentration deficit: Secondary | ICD-10-CM | POA: Diagnosis present

## 2019-02-07 DIAGNOSIS — R41842 Visuospatial deficit: Secondary | ICD-10-CM | POA: Diagnosis present

## 2019-02-07 DIAGNOSIS — R41844 Frontal lobe and executive function deficit: Secondary | ICD-10-CM | POA: Diagnosis present

## 2019-02-07 NOTE — Therapy (Signed)
[place] [address] [place], [place], [address] Phone: [phone]   Fax:  [phone]  Physical Therapy Treatment  Patient Details  Name: Pamela Alvarez MRN: [id number] Date of Birth: [birth year] Referring Provider (PT): Dohmeier, Asencion Partridge   Encounter Date: 02/07/2019  PT End of Session - 02/07/19 2049    Visit Number  18    Number of Visits  24   per recert 76/72/0947   Date for PT Re-Evaluation  03/03/19    Authorization Type  [place] need 10th visit progress note    PT Start Time  1021    PT Stop Time  1059    PT Time Calculation (min)  38 min    Activity Tolerance  Patient tolerated treatment well   Pt seems to need increased cues throughout activities today   Behavior During Therapy  [facility] for tasks assessed/performed       Past Medical History:  Diagnosis Date  . Cyst 07-2008   Cyst on back x2 that were drained  . H/O echocardiogram 02-2000  . Hx of colonic polyps   . Hyperlipidemia   . Hypertension   . Varicose veins of both lower extremities     Past Surgical History:  Procedure Laterality Date  . POLYPECTOMY     Colon  . TONSILLECTOMY    . TUBAL LIGATION      There were no vitals filed for this visit.  Subjective Assessment - 02/07/19 1024    Subjective  No changes since last visit.  Per husband, she is basically getting in and out of the bed by herself.    Patient is accompained by:  Family member    Patient Stated Goals  Pt's goal for therapy is better balance and walking.    Currently in Pain?  No/denies                       [facility] Adult PT Treatment/Exercise - 02/07/19 0001      Transfers   Transfers  Sit to Stand;Stand to Sit    Sit to Stand  With upper extremity assist;From bed;From chair/3-in-1;4: Min guard;4: Min assist    Sit to Stand Details  Verbal cues for sequencing;Verbal cues for technique;Tactile cues for weight shifting    Stand to Sit  With  upper extremity assist;To bed;To chair/3-in-1;4: Min guard;4: Min assist    Stand to Sit Details (indicate cue type and reason)  Verbal cues for technique;Verbal cues for sequencing;Tactile cues for weight shifting    Stand to Sit Details  Continues to need cues to turn fully to sit    Number of Reps  Other reps (comment);Other sets (comment)   5 reps each:  20" mat, 16" chair, 18" BOSU topped block   Transfer Cueing  Worked again on progressively lower surfaces simulating sofa, with pt needing frequent>constant cues similar to last visit (scooting, foot position, forward lean, stand on 3 count).  With cues removed, pt does not perform transfer efficiently or safely; PT provides min guard assistance throughout.  Progressed to round robin stand>sit at varied sitting positiong, (stand pivot type transfer), with pt needing min guard assist and cues throughout.  She has 2 episodes of near sitting too soon prior to reaching chair, needing therapist assist for correct positioning.          Balance Exercises - 02/07/19 2047      Balance Exercises: Standing   Stepping Strategy  Posterior;Lateral;10 reps;UE support  Cues for increased step length, increased foot clearance   Heel Raises Limitations  x 10 rep    Toe Raise Limitations  x 10 rep    Other Standing Exercises  Stagger stance forward/back rocking, x 10 reps each position, with focus on hinging at hips for posterior hip excursion, BUE support and min guard assist at counter.          PT Short Term Goals - 12/29/18 1103      PT SHORT TERM GOAL #1   Title  Pt will perform HEP with family supervision for improved transfers, balance, and gait.  UPDATED TARGET 12/30/2018 ( due to later start with later scheduling)    Time  4    Period  Weeks    Status  Achieved      PT SHORT TERM GOAL #2   Title  Pt will perform 4 of 5 reps of sit<>stand transfers with correct technique, with husband supervision and cues, for improved safety with  transfers.    Time  4    Period  Weeks    Status  Achieved      PT SHORT TERM GOAL #3   Title  Pt will improve Berg Balance score to at least 24/56 for decreased fall risk.    Baseline  31/56 12/27/2018    Time  4    Period  Weeks    Status  Achieved      PT SHORT TERM GOAL #4   Title  Pt will improve TUG score to less than or equal to 23 seconds for decreased fall risk.    Baseline  10/22:  25.68 sec, 25.88 sec    Time  4    Period  Weeks    Status  Not Met      PT SHORT TERM GOAL #5   Title  Pt/husband will verbalize understanding of fall prevention in home environment.    Time  4    Period  Weeks    Status  Achieved        PT Long Term Goals - 01/29/19 5456      PT LONG TERM GOAL #1   Title  Pt/husband will report improvement in bed mobility sequencing and decreased assistance with supine>sit to minimal assistance, for improved safety with bed mobility.  TARGET 03/02/2019    Time  4    Period  Weeks    Status  New      PT LONG TERM GOAL #2   Title  Pt will improve gait velocity to at  least 1.5 ft/sec with rollator and supervision, for improved gait efficiency.    Baseline  1.73 ft/sec 01/25/2019 (no device); rollator 1.35 ft/sec    Time  4    Period  Weeks    Status  Revised      PT LONG TERM GOAL #3   Title  Pt will perform sit<>stand transfers from surfaces <18", simulating sofa, with verbal cues and minimal assistance or less, for improved transfers at home.    Time  4    Period  Weeks    Status  New      PT LONG TERM GOAL #4   Title  Pt will negotiate 4 steps with 1 handrial, with forward descending steps, with min guard assistance.    Time  4    Period  Weeks    Status  New      PT LONG TERM GOAL #5   Title  Pt/husband will  verbalize plan for continued optimal fitness upon d/c from PT.    Time  4    Period  Weeks    Status  New            Plan - 02/07/19 2050    Clinical Impression Statement  Continued to focus on transfer training from  varied surfaces today, with pt needing max verbal cues and min guard throughout.  Pt's husband does report that she has improved with getting in and out of bed, but she still is unpredictable at home with sit<>stand transfers.  She will continue to benefit from further skilled PT to address transfers, balance, strengthening and gait activities for imrpoved functional moiblity.    Personal Factors and Comorbidities  Comorbidity 3+    Comorbidities  PMH includes ankle fracture, HTN, hyperlipidemia, recurrent falls    Examination-Activity Limitations  Squat;Stairs;Stand;Transfers;Locomotion Level;Sit    Examination-Participation Restrictions  Community Activity    Stability/Clinical Decision Making  Evolving/Moderate complexity    Rehab Potential  Fair   MMSE score 16/30; good family support   PT Frequency  2x / week    PT Duration  4 weeks   per recert 06/00/4599   PT Treatment/Interventions  ADLs/Self Care Home Management;Gait training;DME Instruction;Neuromuscular re-education;Balance training;Therapeutic exercise;Therapeutic activities;Functional mobility training;Patient/family education    PT Next Visit Plan  Continue bed mobility, lower surface transfers, stair negotiation; continue to focus on SLS activities, posterior direction stepping/weightshifting; pt mentioned telehealth (need to schedule) for environmental assessment of home for any home safety recommendations    Consulted and Agree with Plan of Care  Patient;Family member/caregiver    Family Member Consulted  Husband       Patient will benefit from skilled therapeutic intervention in order to improve the following deficits and impairments:  Abnormal gait, Difficulty walking, Decreased safety awareness, Decreased balance, Decreased mobility  Visit Diagnosis: Muscle weakness (generalized)  Unsteadiness on feet  Other abnormalities of gait and mobility     Problem List Patient Active Problem List   Diagnosis Date Noted  .  Fluency disorder associated with underlying disease 02/08/2018  . Cognitive and neurobehavioral dysfunction 02/08/2018  . 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 Arthritis of carpometacarpal [facility]) joint of left thumb 01/24/2018  . Ankle fracture, left 09/03/2016  . Hand dysfunction 03/11/2016  . Symptomatic varicose veins, bilateral 03/11/2016  . Numbness of left thumb 03/11/2016  . Elevated BP 07/27/2014  . Bunion of left foot 07/26/2014  . Abnormality of gait 07/26/2014  . Cough 11/10/2013  . Post-nasal drainage 11/10/2013  . Abnormal urine odor 11/10/2013  . Visit for preventive health examination 07/28/2013  . Right anterior knee pain 07/22/2012  . Medicare annual wellness visit, subsequent 07/25/2011  . ANXIETY, SITUATIONAL 09/25/2009  . ARTHRITIS 09/25/2009  . Vitamin D deficiency 09/27/2008  . Hyperlipidemia 09/17/2008  . Elevated blood pressure reading without diagnosis of hypertension 09/17/2008  . COLONIC POLYPS, HX OF 09/17/2008  . PERSONAL HISTORY DISEASES SKIN&SUBCUT TISSUE 09/17/2008    Larnie Heart W. 02/07/2019, 8:54 PM  Frazier Butt., PT   [place] [address] [place], [place], [address] Phone: [phone]   Fax:  [phone]  Name: Pamela Alvarez MRN: [id number] Date of Birth: 1940/07/18

## 2019-02-07 NOTE — Therapy (Signed)
[facility] [address] [place], [place], [address] Phone: [phone]   Fax:  [phone]  Occupational Therapy Treatment  Patient Details  Name: Pamela Alvarez MRN: [id number] Date of Birth: 1940-11-08 No data recorded  Encounter Date: 02/07/2019  OT End of Session - 02/07/19 1219    Visit Number  14    Number of Visits  21    Date for OT Re-Evaluation  02/25/19    Authorization Type  [employer] Medicare    Authorization - Visit Number  14    Authorization - Number of Visits  20    OT Start Time  1103    OT Stop Time  1145    OT Time Calculation (min)  42 min    Activity Tolerance  Patient tolerated treatment well    Behavior During Therapy  [facility] for tasks assessed/performed       Past Medical History:  Diagnosis Date  . Cyst 07-2008   Cyst on back x2 that were drained  . H/O echocardiogram 02-2000  . Hx of colonic polyps   . Hyperlipidemia   . Hypertension   . Varicose veins of both lower extremities     Past Surgical History:  Procedure Laterality Date  . POLYPECTOMY     Colon  . TONSILLECTOMY    . TUBAL LIGATION      There were no vitals filed for this visit.  Subjective Assessment - 02/07/19 1219    Subjective   Pt denies pain    Patient is accompanied by:  Family member   husband [name])   Pertinent History  78 y.o female with recent declining cognitive abilities, per MRI -right frontal, right parietal and right cerebellar encephalomalacia and gliosis, possible embolic chronic ischemic infarcts.Pt has hx of ankle fx, hyperlipidemia, and thumb CMC arthritis. Per pt's husband MD is still trying to determine a definative diagnosis, per chart, possible PSP vs. TBI.    Limitations  fall risk    Patient Stated Goals  improved use of L hand, to be as independent as possible    Currently in Pain?  No/denies    Pain Onset  Today               Supine closed chain shoulder flexion, diagonals, horizontal  abduction, mod facilitation/ v.c Seated closed chain shoulder flexion, min facilitation/ v.c Seated at table, stringing beads for bilateral UE functional use, min v.c for LUE use. Pt practiced pouring water and beans from measuring cups into a bowl, pt required min facilitation/ mod v.c for techniques. Therapist encouraged pt's husband to allow her to assist with adding ingredients into a bowl if baking while seated with assistance.  As pt enjoyed cooking in the past. Writing activity with pt copying her name. Pt demonstrates improved legibility yet difficulty keeping letters on the line.              OT Short Term Goals - 01/19/19 1245      OT SHORT TERM GOAL #1   Title  Pt/ caregiver will be I with HEP.    Time  4    Period  Weeks    Status  Achieved    Target Date  12/22/18      OT SHORT TERM GOAL #2   Title  Pt/ husband will verbalize understanding of adapted strategies to maximize safety and independence with ADLs/ IADLs.    Time  4    Period  Weeks    Status  Achieved   Pt's husband reports ADLs are going well, he is using questioning cues     OT SHORT TERM GOAL #3   Title  Pt will demonstrate ability to retrieve a lightweight object at 100 shoulder flexion with LUE.    Time  4    Period  Weeks    Status  Achieved   100     OT SHORT TERM GOAL #4   Title  Pt will demonstrate ability to retrieve a lightweight object at 115 shoulder flexion with RUE.    Time  4    Period  Weeks    Status  Achieved   120       OT Long Term Goals - 02/07/19 1220      OT LONG TERM GOAL #1   Title  Pt will increase LUE funtional use and reach as eveidenced by increasing LUE box/ blocks to 15 blocks consistently.    Baseline  01/19/2019-13, 14 blocks    Time  5    Period  Weeks    Status  On-going   13 blocks,     OT LONG TERM GOAL #2   Title  Pt will increase LUE grip strength to 25 lbs or greater for increased ease with ADLS.    Baseline  19 lbs on 01/19/2019    Time   5    Period  Weeks    Status  On-going   19 lbs 01/19/2019     OT LONG TERM GOAL #3   Title  Pt will demonstrate ability to write her name legibly with good letter size x 3  trials.    Time  5    Period  Weeks    Status  On-going   Pt writes her name legibly at times, inconsistent, pt does best if copying her name.     OT LONG TERM GOAL #4   Title  Pt will demonstrate ability to retrieve a lightweight object aty 105 shoulder flexion with LUE.    Period  Weeks    Status  On-going   100*     OT LONG TERM GOAL #5   Title  Pt will consistently use LUE to assist with ADLs with no more than min v.c    Time  5    Period  Weeks    Status  New            Plan - 02/07/19 1220    Clinical Impression Statement  Pt is progressing towards goals. She demonstrates improving hand writing and LUE functional use.    Occupational performance deficits (Please refer to evaluation for details):  ADL's;IADL's;Leisure;Work;Social Participation    Body Structure / Function / Physical Skills  ADL;UE functional use;Balance;Flexibility;Vision;FMC;ROM;Gait;Coordination;GMC;Sensation;Decreased knowledge of precautions;Decreased knowledge of use of DME;IADL;Strength;Dexterity;Mobility    Cognitive Skills  Attention;Learn;Memory;Problem Solve;Safety Awareness;Sequencing;Thought;Understand    Rehab Potential  Fair    OT Frequency  2x / week    OT Duration  6 weeks    OT Treatment/Interventions  Self-care/ADL training;Energy conservation;Visual/perceptual remediation/compensation;Patient/family education;DME and/or AE instruction;Aquatic Therapy;Paraffin;Passive range of motion;Balance training;Fluidtherapy;Cryotherapy;Therapist, nutritional;Therapeutic activities;Manual Therapy;Therapeutic exercise;Moist Heat;Neuromuscular education;Cognitive remediation/compensation    Plan  simple functional tasks, LUE forced use and attention to Lt side.    Consulted and Agree with Plan of Care  Patient;Family  member/caregiver    Family Member Consulted  husband       Patient will benefit from skilled therapeutic intervention in order to improve the following deficits and impairments:  Body Structure / Function / Physical Skills: ADL, UE functional use, Balance, Flexibility, Vision, FMC, ROM, Gait, Coordination, GMC, Sensation, Decreased knowledge of precautions, Decreased knowledge of use of DME, IADL, Strength, Dexterity, Mobility Cognitive Skills: Attention, Learn, Memory, Problem Solve, Safety Awareness, Sequencing, Thought, Understand     Visit Diagnosis: Muscle weakness (generalized)  Frontal lobe and executive function deficit  Attention and concentration deficit  Visuospatial deficit  Other lack of coordination  Other abnormalities of gait and mobility    Problem List Patient Active Problem List   Diagnosis Date Noted  . Fluency disorder associated with underlying disease 02/08/2018  . Cognitive and neurobehavioral dysfunction 02/08/2018  . 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 Arthritis of carpometacarpal [facility] joint of left thumb 01/24/2018  . Ankle fracture, left 09/03/2016  . Hand dysfunction 03/11/2016  . Symptomatic varicose veins, bilateral 03/11/2016  . Numbness of left thumb 03/11/2016  . Elevated BP 07/27/2014  . Bunion of left foot 07/26/2014  . Abnormality of gait 07/26/2014  . Cough 11/10/2013  . Post-nasal drainage 11/10/2013  . Abnormal urine odor 11/10/2013  . Visit for preventive health examination 07/28/2013  . Right anterior knee pain 07/22/2012  . Medicare annual wellness visit, subsequent 07/25/2011  . ANXIETY, SITUATIONAL 09/25/2009  . ARTHRITIS 09/25/2009  . Vitamin D deficiency 09/27/2008  . Hyperlipidemia 09/17/2008  . Elevated blood pressure reading without diagnosis of hypertension 09/17/2008  . COLONIC POLYPS, HX OF 09/17/2008  . PERSONAL HISTORY DISEASES SKIN&SUBCUT TISSUE 09/17/2008    Kasiah Manka 02/07/2019, 12:21 PM  [facility] [address] [place], [place], [address] Phone: [phone]   Fax:  [phone]  Name: Lorie ApleySandra Luther MRN: [id number] Date of Birth: 09/12/1940

## 2019-02-07 NOTE — Therapy (Signed)
[facility] [address] [place], [place], [address] Phone: [phone]   Fax:  [phone]  Speech Language Pathology Treatment  Patient Details  Name: Pamela Alvarez MRN: [id number] Date of Birth: December 23, 1940 Referring Provider (SLP): Dohmeier, Asencion Partridge, MD   Encounter Date: 02/07/2019  End of Session - 02/07/19 1254    Visit Number  18    Number of Visits  33    Date for SLP Re-Evaluation  05/01/19    SLP Start Time  1150    SLP Stop Time   1232    SLP Time Calculation (min)  42 min    Activity Tolerance  Patient tolerated treatment well       Past Medical History:  Diagnosis Date  . Cyst 07-2008   Cyst on back x2 that were drained  . H/O echocardiogram 02-2000  . Hx of colonic polyps   . Hyperlipidemia   . Hypertension   . Varicose veins of both lower extremities     Past Surgical History:  Procedure Laterality Date  . POLYPECTOMY     Colon  . TONSILLECTOMY    . TUBAL LIGATION      There were no vitals filed for this visit.  Subjective Assessment - 02/07/19 1153    Subjective  "How was your Thanksgiving?"    Patient is accompained by:  Family member    Currently in Pain?  No/denies            ADULT SLP TREATMENT - 02/07/19 1300      General Information   Behavior/Cognition  Alert;Cooperative;Pleasant mood      Treatment Provided   Treatment provided  Cognitive-Linquistic      Cognitive-Linquistic Treatment   Treatment focused on  Aphasia;Patient/family/caregiver education    Skilled Treatment  Patient greeted SLP, initiating conversation by asking a question (see "S"). SLP provided extra time in conversation re: Thanksgiving meal and activities; patient's conversation grossly functional over 5 minute period. SLP requested clarification x1 "Our oldest grandchild... was... in charge... the table... the tablecloth" and pt confirmed via Y/N questions + thumbs up that she was referring to her  granddaughter setting the table for the meal. SLP used whiteboard to write key words in subsequent conversations. Patient initiated more frequently in today's session, asking several questions. SLP provided education to pt and spouse re: strategies to balance the conversation at home, including pt using non-verbal signals to alert conversation partner that she would like to contribute something. Suggested daily conversation practice with husband around a shared topic, such as watching the news or a TV show together and then discussing afterwards. Suggested to husband that he could use whiteboard to write keywords to assist pt with vocabulary during discussion.      Assessment / Recommendations / Plan   Plan  Continue with current plan of care      Progression Toward Goals   Progression toward goals  Progressing toward goals         SLP Short Term Goals - 02/07/19 1308      SLP SHORT TERM GOAL #1   Title  pt will demo ability for functionalality with simple household langauge tasks (e.g., grocery list, errand list, to-do list) over 3 sessions    Time  1    Period  Weeks    Status  Not Met      SLP SHORT TERM GOAL #2   Title  pt will achieve functional communication of wants/needs at home (reported, if  needed) over two sessions    Time  1    Period  Weeks    Status  Not Met      SLP SHORT TERM GOAL #3   Title  pt will demo correct right/left comprehension in simple functional commands 9/10 opportunities    Time  1    Period  Weeks    Status  Not Met      SLP SHORT TERM GOAL #4   Title  pt will complete full aphasia eval before total visit #3    Baseline  form 1 completed 12/06/18    Status  Achieved      SLP SHORT TERM GOAL #5   Title  pt will complete cognitive linguistic eval PRN if deemed clinically necessary    Baseline  started 12/29/18    Time  1    Period  Weeks    Status  Partially Met       SLP Long Term Goals - 02/07/19 1308      SLP LONG TERM GOAL #1   Title   pt will demonstrate improved verbal communication (as rated by pt) compared to when ST began    Period  Weeks    Status  Achieved      SLP [place] #2   Title  pt will report speech and language is less frustrating than prior to initiation of ST    Time  1    Period  Weeks    Status  Achieved      SLP LONG TERM GOAL #3   Title  pt will produce functional verbal expression in 3 minutes simple conversation    Baseline  02/07/19    Time  4   renewed 11/24   Period  Weeks    Status  Revised   omitted "over three sessions"     SLP LONG TERM GOAL #4   Title  pt will use multimodal communication PRN to express wants/needs    Time  4   renewed 11/24   Period  Weeks    Status  On-going      SLP LONG TERM GOAL #5   Title  pt will functionally write her first name independently in 7/10 attempts over 3 sessions    Time  4   renewed 11/24   Period  Weeks    Status  On-going       Plan - 02/07/19 1257    Clinical Impression Statement  Pt continues to present as an atypical TBI pt or CVA pt with mild-moderate (more moderate) anomic aphasia per Western Aphasia Battery-Revised; cognitive deficits including visuospatial skills, organization, slow processing ID'd per CLQT. See "skilled intervention" for details on today's session. Pt continues to make gains in simple conversation in therapy room as well as improved communication with family members outside of [place]. SLP recommends cont'd course of skilled ST to address pt's decr'd language-cogniton ability/possible verbal apraxia to incr. pt independence.    Speech Therapy Frequency  2x / week    Duration  --   8 weeks or 17 visits   Treatment/Interventions  SLP instruction and feedback;Compensatory strategies;Patient/family education;Multimodal communcation approach;Cueing hierarchy;Language facilitation;Functional tasks;Oral motor exercises;Internal/external aids;Environmental controls;Cognitive reorganization    Potential to Achieve Goals   Fair    Potential Considerations  Severity of impairments       Patient will benefit from skilled therapeutic intervention in order to improve the following deficits and impairments:   Aphasia  Cognitive communication deficit  Problem List Patient Active Problem List   Diagnosis Date Noted  . Fluency disorder associated with underlying disease 02/08/2018  . Cognitive and neurobehavioral dysfunction 02/08/2018  . 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 Arthritis of carpometacarpal [facility]) joint of left thumb 01/24/2018  . Ankle fracture, left 09/03/2016  . Hand dysfunction 03/11/2016  . Symptomatic varicose veins, bilateral 03/11/2016  . Numbness of left thumb 03/11/2016  . Elevated BP 07/27/2014  . Bunion of left foot 07/26/2014  . Abnormality of gait 07/26/2014  . Cough 11/10/2013  . Post-nasal drainage 11/10/2013  . Abnormal urine odor 11/10/2013  . Visit for preventive health examination 07/28/2013  . Right anterior knee pain 07/22/2012  . Medicare annual wellness visit, subsequent 07/25/2011  . ANXIETY, SITUATIONAL 09/25/2009  . ARTHRITIS 09/25/2009  . Vitamin D deficiency 09/27/2008  . Hyperlipidemia 09/17/2008  . Elevated blood pressure reading without diagnosis of hypertension 09/17/2008  . COLONIC POLYPS, HX OF 09/17/2008  . PERSONAL HISTORY DISEASES SKIN&SUBCUT TISSUE 09/17/2008   Deneise Lever, [place], CCC-SLP Speech-Language Pathologist  Aliene Altes 02/07/2019, 1:09 PM  [facility] [address] [place], [place], [address] Phone: [phone]   Fax:  [phone]   Name: Baylee Campus MRN: [id number] Date of Birth: 1940/03/29

## 2019-02-09 ENCOUNTER — Ambulatory Visit: Payer: Medicare Other | Admitting: Speech Pathology

## 2019-02-09 ENCOUNTER — Ambulatory Visit: Payer: Medicare Other | Admitting: Occupational Therapy

## 2019-02-09 ENCOUNTER — Other Ambulatory Visit: Payer: Self-pay

## 2019-02-09 ENCOUNTER — Ambulatory Visit: Payer: Medicare Other | Admitting: Physical Therapy

## 2019-02-09 DIAGNOSIS — R278 Other lack of coordination: Secondary | ICD-10-CM

## 2019-02-09 DIAGNOSIS — R41840 Attention and concentration deficit: Secondary | ICD-10-CM

## 2019-02-09 DIAGNOSIS — M6281 Muscle weakness (generalized): Secondary | ICD-10-CM | POA: Diagnosis not present

## 2019-02-09 DIAGNOSIS — R41841 Cognitive communication deficit: Secondary | ICD-10-CM

## 2019-02-09 DIAGNOSIS — R41844 Frontal lobe and executive function deficit: Secondary | ICD-10-CM

## 2019-02-09 DIAGNOSIS — R2689 Other abnormalities of gait and mobility: Secondary | ICD-10-CM

## 2019-02-09 DIAGNOSIS — R41842 Visuospatial deficit: Secondary | ICD-10-CM

## 2019-02-09 DIAGNOSIS — R4701 Aphasia: Secondary | ICD-10-CM

## 2019-02-09 DIAGNOSIS — R2681 Unsteadiness on feet: Secondary | ICD-10-CM

## 2019-02-09 NOTE — Therapy (Signed)
[facility] [address] [place], [place], [address] Phone: [phone]   Fax:  [phone]  Speech Language Pathology Treatment  Patient Details  Name: Pamela Alvarez MRN: [id number] Date of Birth: 04-Oct-1940 Referring Provider (SLP): Dohmeier, Asencion Partridge, MD   Encounter Date: 02/09/2019  End of Session - 02/09/19 1528    Visit Number  19    Number of Visits  33    Date for SLP Re-Evaluation  05/01/19    SLP Start Time  1103    SLP Stop Time   1145    SLP Time Calculation (min)  42 min    Activity Tolerance  Patient tolerated treatment well       Past Medical History:  Diagnosis Date  . Cyst 07-2008   Cyst on back x2 that were drained  . H/O echocardiogram 02-2000  . Hx of colonic polyps   . Hyperlipidemia   . Hypertension   . Varicose veins of both lower extremities     Past Surgical History:  Procedure Laterality Date  . POLYPECTOMY     Colon  . TONSILLECTOMY    . TUBAL LIGATION      There were no vitals filed for this visit.  Subjective Assessment - 02/09/19 1106    Subjective  "I don't like it when you slide my chair." (to husband)    Currently in Pain?  No/denies            ADULT SLP TREATMENT - 02/09/19 1106      General Information   Behavior/Cognition  Alert;Cooperative;Pleasant mood      Treatment Provided   Treatment provided  Cognitive-Linquistic      Cognitive-Linquistic Treatment   Treatment focused on  Aphasia;Patient/family/caregiver education    Skilled Treatment  Therapist focused session on conversation with compensations/conversational supports. SLP discussed topics of patient interest (exceptional children/pt's field of work, travel). Conversational burden on therapist, however patient continues to expand conversational skills, generating complete sentences in response to questions with extended time, making spontaneous comments and asking follow-up questions. Therapist modeled  pointing to written keywords and patient returned demonstration when given verbal/visual choice between 2 topics/concepts.      Assessment / Recommendations / Plan   Plan  Continue with current plan of care      Progression Toward Goals   Progression toward goals  Progressing toward goals       SLP Education - 02/09/19 1528    Education Details  use of images to assist in/expand verbal expression    Person(s) Educated  Patient;Spouse    Methods  Explanation    Comprehension  Verbalized understanding       SLP Short Term Goals - 02/09/19 1529      SLP SHORT TERM GOAL #1   Title  pt will demo ability for functionalality with simple household langauge tasks (e.g., grocery list, errand list, to-do list) over 3 sessions    Time  1    Period  Weeks    Status  Not Met      SLP SHORT TERM GOAL #2   Title  pt will achieve functional communication of wants/needs at home (reported, if needed) over two sessions    Time  1    Period  Weeks    Status  Not Met      SLP SHORT TERM GOAL #3   Title  pt will demo correct right/left comprehension in simple functional commands 9/10 opportunities    Time  1    Period  Weeks    Status  Not Met      SLP SHORT TERM GOAL #4   Title  pt will complete full aphasia eval before total visit #3    Baseline  form 1 completed 12/06/18    Status  Achieved      SLP SHORT TERM GOAL #5   Title  pt will complete cognitive linguistic eval PRN if deemed clinically necessary    Baseline  started 12/29/18    Time  1    Period  Weeks    Status  Partially Met       SLP Long Term Goals - 02/09/19 1529      SLP LONG TERM GOAL #1   Title  pt will demonstrate improved verbal communication (as rated by pt) compared to when ST began    Period  Weeks    Status  Achieved      SLP [place] #2   Title  pt will report speech and language is less frustrating than prior to initiation of ST    Time  1    Period  Weeks    Status  Achieved      SLP LONG  TERM GOAL #3   Title  pt will produce functional verbal expression in 3 minutes simple conversation    Baseline  02/07/19 02/09/19    Time  4   renewed 11/24   Period  Weeks    Status  On-going   omitted "over three sessions"     SLP LONG TERM GOAL #4   Title  pt will use multimodal communication PRN to express wants/needs    Time  4   renewed 11/24   Period  Weeks    Status  On-going      SLP LONG TERM GOAL #5   Title  pt will functionally write her first name independently in 7/10 attempts over 3 sessions    Time  4   renewed 11/24   Period  Weeks    Status  On-going       Plan - 02/09/19 1529    Clinical Impression Statement  Pt continues to present as an atypical TBI pt or CVA pt with mild-moderate (more moderate) anomic aphasia per Western Aphasia Battery-Revised; cognitive deficits including visuospatial skills, organization, slow processing ID'd per CLQT. See "skilled intervention" for details on today's session. Pt continues to make gains in simple conversation in therapy room as well as improved communication with family members outside of [place]. SLP recommends cont'd course of skilled ST to address pt's decr'd language-cogniton ability/possible verbal apraxia to incr. pt independence.    Speech Therapy Frequency  2x / week    Duration  --   8 weeks or 17 visits   Treatment/Interventions  SLP instruction and feedback;Compensatory strategies;Patient/family education;Multimodal communcation approach;Cueing hierarchy;Language facilitation;Functional tasks;Oral motor exercises;Internal/external aids;Environmental controls;Cognitive reorganization    Potential to Achieve Goals  Fair    Potential Considerations  Severity of impairments       Patient will benefit from skilled therapeutic intervention in order to improve the following deficits and impairments:   Aphasia  Cognitive communication deficit    Problem List Patient Active Problem List   Diagnosis Date Noted  .  Fluency disorder associated with underlying disease 02/08/2018  . Cognitive and neurobehavioral dysfunction 02/08/2018  . 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 Arthritis of carpometacarpal [facility]) joint of left thumb 01/24/2018  . Ankle fracture, left 09/03/2016  .  Hand dysfunction 03/11/2016  . Symptomatic varicose veins, bilateral 03/11/2016  . Numbness of left thumb 03/11/2016  . Elevated BP 07/27/2014  . Bunion of left foot 07/26/2014  . Abnormality of gait 07/26/2014  . Cough 11/10/2013  . Post-nasal drainage 11/10/2013  . Abnormal urine odor 11/10/2013  . Visit for preventive health examination 07/28/2013  . Right anterior knee pain 07/22/2012  . Medicare annual wellness visit, subsequent 07/25/2011  . ANXIETY, SITUATIONAL 09/25/2009  . ARTHRITIS 09/25/2009  . Vitamin D deficiency 09/27/2008  . Hyperlipidemia 09/17/2008  . Elevated blood pressure reading without diagnosis of hypertension 09/17/2008  . COLONIC POLYPS, HX OF 09/17/2008  . PERSONAL HISTORY DISEASES SKIN&SUBCUT TISSUE 09/17/2008   Deneise Lever, [place], CCC-SLP Speech-Language Pathologist  Aliene Altes 02/09/2019, 3:30 PM  [facility] [address] [place], [place], [address] Phone: [phone]   Fax:  [phone]   Name: Baelyn Doring MRN: [id number] Date of Birth: Jun 11, 1940

## 2019-02-09 NOTE — Therapy (Signed)
[facility] [address] [place], [place], [address] Phone: [phone]   Fax:  [phone]  Occupational Therapy Treatment  Patient Details  Name: Pamela Alvarez MRN: [id number] Date of Birth: 11/23/1940 No data recorded  Encounter Date: 02/09/2019  OT End of Session - 02/09/19 1341    Visit Number  15    Number of Visits  21    Date for OT Re-Evaluation  02/25/19    Authorization Type  [employer] Medicare    Authorization - Visit Number  15    Authorization - Number of Visits  20    OT Start Time  1155    OT Stop Time  1235    OT Time Calculation (min)  40 min    Activity Tolerance  Patient tolerated treatment well    Behavior During Therapy  [facility] for tasks assessed/performed       Past Medical History:  Diagnosis Date  . Cyst 07-2008   Cyst on back x2 that were drained  . H/O echocardiogram 02-2000  . Hx of colonic polyps   . Hyperlipidemia   . Hypertension   . Varicose veins of both lower extremities     Past Surgical History:  Procedure Laterality Date  . POLYPECTOMY     Colon  . TONSILLECTOMY    . TUBAL LIGATION      There were no vitals filed for this visit.  Subjective Assessment - 02/09/19 1157    Subjective   Pt denies pain    Patient is accompanied by:  Family member   husband Harrie Jeans)   Pertinent History  78 y.o female with recent declining cognitive abilities, per MRI -right frontal, right parietal and right cerebellar encephalomalacia and gliosis, possible embolic chronic ischemic infarcts.Pt has hx of ankle fx, hyperlipidemia, and thumb CMC arthritis. Per pt's husband MD is still trying to determine a definative diagnosis, per chart, possible PSP vs. TBI.    Limitations  fall risk    Patient Stated Goals  improved use of L hand, to be as independent as possible    Currently in Pain?  Yes    Pain Onset  Today            Treatment: Therapist checked progress towards pt goals, husband was  present. Pt demonstrates ability to write her name legibility approx 2/3 trials with improved legibility and letter size. Standing to perform functional reaching to place and remove graded clothespins form vertical antennae with LUE, min difficulty/ v.c Stacking coins with LUE then picking up to place in container with LUE, min difficulty/ vc. Discussed with pt/ husband importance of pt participation in simple familiar taks at home such as mixing dry ingredients for baking in seated.               OT Education - 02/09/19 1341    Education Details  checked progress towards goals -see goals for progress, discussed that pt shoulde perfrom functional activities in the home for several months before considering return to therapy    Person(s) Educated  Patient;Spouse    Methods  Explanation;Demonstration;Verbal cues;Handout    Comprehension  Verbalized understanding;Returned demonstration;Verbal cues required       OT Short Term Goals - 01/19/19 1245      OT SHORT TERM GOAL #1   Title  Pt/ caregiver will be I with HEP.    Time  4    Period  Weeks    Status  Achieved    Target  Date  12/22/18      OT SHORT TERM GOAL #2   Title  Pt/ husband will verbalize understanding of adapted strategies to maximize safety and independence with ADLs/ IADLs.    Time  4    Period  Weeks    Status  Achieved   Pt's husband reports ADLs are going well, he is using questioning cues     OT SHORT TERM GOAL #3   Title  Pt will demonstrate ability to retrieve a lightweight object at 100 shoulder flexion with LUE.    Time  4    Period  Weeks    Status  Achieved   100     OT SHORT TERM GOAL #4   Title  Pt will demonstrate ability to retrieve a lightweight object at 115 shoulder flexion with RUE.    Time  4    Period  Weeks    Status  Achieved   120       OT Long Term Goals - 02/09/19 1201      OT LONG TERM GOAL #1   Title  Pt will increase LUE funtional use and reach as eveidenced by  increasing LUE box/ blocks to 15 blocks consistently.    Baseline  01/19/2019-13, 14 blocks    Time  5    Period  Weeks    Status  Not Met   11 blocks     OT LONG TERM GOAL #2   Title  Pt will increase LUE grip strength to 25 lbs or greater for increased ease with ADLS.    Baseline  19 lbs on 01/19/2019    Time  5    Period  Weeks    Status  Not Met   21 lbs     OT LONG TERM GOAL #3   Title  Pt will demonstrate ability to write her name legibly with good letter size x 3  trials.    Time  5    Period  Weeks    Status  Partially Met   2/3 times for copying name     OT LONG TERM GOAL #4   Title  Pt will demonstrate ability to retrieve a lightweight object aty 105 shoulder flexion with LUE.    Period  Weeks    Status  Not Met   100*     OT LONG TERM GOAL #5   Title  Pt will consistently use LUE to assist with ADLs with no more than min v.c    Time  5    Period  Weeks    Status  Achieved   min v.c for LUE use with ADLs           Plan - 02/09/19 1348    Clinical Impression Statement  Pt/ husband agree with plans for d/c at this time. Pt made overall progress with functional use of LUE however she did not fully meet all goals due to the severity of deficits.    Occupational performance deficits (Please refer to evaluation for details):  ADL's;IADL's;Leisure;Work;Social Participation    Body Structure / Function / Physical Skills  ADL;UE functional use;Balance;Flexibility;Vision;FMC;ROM;Gait;Coordination;GMC;Sensation;Decreased knowledge of precautions;Decreased knowledge of use of DME;IADL;Strength;Dexterity;Mobility    Cognitive Skills  Attention;Learn;Memory;Problem Solve;Safety Awareness;Sequencing;Thought;Understand    Rehab Potential  Fair    OT Frequency  2x / week    OT Duration  6 weeks    OT Treatment/Interventions  Self-care/ADL training;Energy conservation;Visual/perceptual remediation/compensation;Patient/family education;DME and/or AE instruction;Aquatic  Therapy;Paraffin;Passive range of motion;Balance  training;Fluidtherapy;Cryotherapy;Therapist, nutritional;Therapeutic activities;Manual Therapy;Therapeutic exercise;Moist Heat;Neuromuscular education;Cognitive remediation/compensation    Plan  d/c OT    Consulted and Agree with Plan of Care  Patient;Family member/caregiver    Family Member Consulted  husband       Patient will benefit from skilled therapeutic intervention in order to improve the following deficits and impairments:   Body Structure / Function / Physical Skills: ADL, UE functional use, Balance, Flexibility, Vision, FMC, ROM, Gait, Coordination, GMC, Sensation, Decreased knowledge of precautions, Decreased knowledge of use of DME, IADL, Strength, Dexterity, Mobility Cognitive Skills: Attention, Learn, Memory, Problem Solve, Safety Awareness, Sequencing, Thought, Understand     Visit Diagnosis: Muscle weakness (generalized)  Frontal lobe and executive function deficit  Attention and concentration deficit  Visuospatial deficit  Other lack of coordination  Unsteadiness on feet  Other abnormalities of gait and mobility   OCCUPATIONAL THERAPY DISCHARGE SUMMARY    Current functional level related to goals / functional outcomes: Pt made good overall progress however she did not fully achieve all goals due to the severity of her deficits.   Remaining deficits: Decreased LUE strenght, ROM and coordination, cognitive deficits, decreased balance, decreased functional mobility.   Education / Equipment: Pt/ husband were educated regarding activities to perform at home and HEP. Pt's caregiver verbalized understanding. Pt. has achieved maximal benefit from OT at this time. Plan: Patient agrees to discharge.  Patient goals were partially met.                                                                                                      ?????     Problem List Patient Active Problem List   Diagnosis Date Noted   . Fluency disorder associated with underlying disease 02/08/2018  . Cognitive and neurobehavioral dysfunction 02/08/2018  . 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 Arthritis of carpometacarpal [facility]) joint of left thumb 01/24/2018  . Ankle fracture, left 09/03/2016  . Hand dysfunction 03/11/2016  . Symptomatic varicose veins, bilateral 03/11/2016  . Numbness of left thumb 03/11/2016  . Elevated BP 07/27/2014  . Bunion of left foot 07/26/2014  . Abnormality of gait 07/26/2014  . Cough 11/10/2013  . Post-nasal drainage 11/10/2013  . Abnormal urine odor 11/10/2013  . Visit for preventive health examination 07/28/2013  . Right anterior knee pain 07/22/2012  . Medicare annual wellness visit, subsequent 07/25/2011  . ANXIETY, SITUATIONAL 09/25/2009  . ARTHRITIS 09/25/2009  . Vitamin D deficiency 09/27/2008  . Hyperlipidemia 09/17/2008  . Elevated blood pressure reading without diagnosis of hypertension 09/17/2008  . COLONIC POLYPS, HX OF 09/17/2008  . PERSONAL HISTORY DISEASES SKIN&SUBCUT TISSUE 09/17/2008    Tanis Hensarling 02/09/2019, 1:49 PM  [facility] [address] [place], [place], [address] Phone: [phone]   Fax:  [phone]  Name: Erykah Lippert MRN: [id number] Date of Birth: Aug 30, 1940

## 2019-02-09 NOTE — Therapy (Deleted)
[facility] [address] [place], [place], [address] Phone: [phone]   Fax:  [phone]  February 09, 2019   No Recipients  Speech Language Pathology Therapy Discharge Summary   Patient: Pamela Alvarez  MRN: [id number]  Date of Birth: 04/01/1940   Diagnosis: Aphasia  Cognitive communication deficit Referring Provider (SLP): Dohmeier, Asencion Partridge, MD   The above patient had been seen in Speech Language Pathology *** times of *** treatments scheduled with *** no shows and *** cancellations.  The treatment consisted of *** The patient is: {improved/worse/unchanged:3041574}  Subjective: ***  Discharge Findings: ***  Functional Status at Discharge: ***  {SWFUX:[id number]}  Plan - 02/09/19 1529    Clinical Impression Statement  Pt continues to present as an atypical TBI pt or CVA pt with mild-moderate (more moderate) anomic aphasia per [employer] Aphasia Battery-Revised; cognitive deficits including visuospatial skills, organization, slow processing ID'd per CLQT. See "skilled intervention" for details on today's session. Pt continues to make gains in simple conversation in therapy room as well as improved communication with family members outside of [place]. SLP recommends cont'd course of skilled ST to address pt's decr'd language-cogniton ability/possible verbal apraxia to incr. pt independence.    Speech Therapy Frequency  2x / week    Duration  --   8 weeks or 17 visits   Treatment/Interventions  SLP instruction and feedback;Compensatory strategies;Patient/family education;Multimodal communcation approach;Cueing hierarchy;Language facilitation;Functional tasks;Oral motor exercises;Internal/external aids;Environmental controls;Cognitive reorganization    Potential to Achieve Goals  Fair    Potential Considerations  Severity of impairments          Sincerely,   Aliene Altes, CCC-SLP    CC No Recipients  [facility] [address] [place], [place], [address] Phone: [phone]   Fax:  [phone]

## 2019-02-10 NOTE — Therapy (Signed)
[facility] [address] [place], [place], [address] Phone: [phone]   Fax:  [phone]  Physical Therapy Treatment  Patient Details  Name: Pamela Alvarez MRN: [id number] Date of Birth: 1940/11/06 Referring Provider (PT): Dohmeier, Asencion Partridge   Encounter Date: 02/09/2019  PT End of Session - 02/10/19 1418    Visit Number  19    Number of Visits  24   per recert 24/82/5003   Date for PT Re-Evaluation  03/03/19    Authorization Type  [employer] Medicare-will need 10th visit progress note    PT Start Time  1020    PT Stop Time  1059    PT Time Calculation (min)  39 min    Activity Tolerance  Patient tolerated treatment well   Pt seems to need increased cues throughout activities today   Behavior During Therapy  [facility] for tasks assessed/performed       Past Medical History:  Diagnosis Date  . Cyst 07-2008   Cyst on back x2 that were drained  . H/O echocardiogram 02-2000  . Hx of colonic polyps   . Hyperlipidemia   . Hypertension   . Varicose veins of both lower extremities     Past Surgical History:  Procedure Laterality Date  . POLYPECTOMY     Colon  . TONSILLECTOMY    . TUBAL LIGATION      There were no vitals filed for this visit.  Subjective Assessment - 02/09/19 1022    Subjective  Husband reports he left her at home for about 3 hours yesterday and she did fine with the rollator.    Patient is accompained by:  Family member    Patient Stated Goals  Pt's goal for therapy is better balance and walking.    Currently in Pain?  No/denies                       [facility] Adult PT Treatment/Exercise - 02/09/19 1038      Ambulation/Gait   Ambulation/Gait  Yes    Ambulation/Gait Assistance  4: Min guard;5: Supervision    Ambulation/Gait Assistance Details  Additional 200 ft, including figure-8s around stools for obstacles    Ambulation Distance (Feet)  345 Feet    Assistive device  Rollator    Gait  Pattern  Step-through pattern;Decreased step length - right;Decreased step length - left;Shuffle;Decreased dorsiflexion - right;Decreased dorsiflexion - left    Ambulation Surface  Level;Indoor    Stairs  Yes    Stairs Assistance  4: Min guard    Stairs Assistance Details (indicate cue type and reason)  Cues to slide arms down rail while descending, to avoid posterior lean    Stair Management Technique  Two rails;Step to pattern;Forwards    Number of Stairs  4   x 4   Height of Stairs  6    Gait Comments  With obstacle negotiation, cues to swing wide to avoid obstacles on L side.  Pt has one episode of hitting stool on L side.  Additional 200 ft of gait wtih rollator, head turns to look for objects, with PT close supervision/min guard.          Balance Exercises - 02/10/19 1416      Balance Exercises: Standing   Standing Eyes Opened  Wide (BOA);Head turns;Solid surface;5 reps   Head nods, 2 sets   Stepping Strategy  Posterior;Lateral;10 reps;UE support   Cues for increased step length, foot clearance  Other Standing Exercises  Stagger stance forward/back rocking, x 10 reps each position, with focus on hinging at hips for posterior hip excursion, BUE support and min guard assist at counter.          PT Short Term Goals - 12/29/18 1103      PT SHORT TERM GOAL #1   Title  Pt will perform HEP with family supervision for improved transfers, balance, and gait.  UPDATED TARGET 12/30/2018 ( due to later start with later scheduling)    Time  4    Period  Weeks    Status  Achieved      PT SHORT TERM GOAL #2   Title  Pt will perform 4 of 5 reps of sit<>stand transfers with correct technique, with husband supervision and cues, for improved safety with transfers.    Time  4    Period  Weeks    Status  Achieved      PT SHORT TERM GOAL #3   Title  Pt will improve Berg Balance score to at least 24/56 for decreased fall risk.    Baseline  31/56 12/27/2018    Time  4    Period  Weeks     Status  Achieved      PT SHORT TERM GOAL #4   Title  Pt will improve TUG score to less than or equal to 23 seconds for decreased fall risk.    Baseline  10/22:  25.68 sec, 25.88 sec    Time  4    Period  Weeks    Status  Not Met      PT SHORT TERM GOAL #5   Title  Pt/husband will verbalize understanding of fall prevention in home environment.    Time  4    Period  Weeks    Status  Achieved        PT Long Term Goals - 01/29/19 1683      PT LONG TERM GOAL #1   Title  Pt/husband will report improvement in bed mobility sequencing and decreased assistance with supine>sit to minimal assistance, for improved safety with bed mobility.  TARGET 03/02/2019    Time  4    Period  Weeks    Status  New      PT LONG TERM GOAL #2   Title  Pt will improve gait velocity to at  least 1.5 ft/sec with rollator and supervision, for improved gait efficiency.    Baseline  1.73 ft/sec 01/25/2019 (no device); rollator 1.35 ft/sec    Time  4    Period  Weeks    Status  Revised      PT LONG TERM GOAL #3   Title  Pt will perform sit<>stand transfers from surfaces <18", simulating sofa, with verbal cues and minimal assistance or less, for improved transfers at home.    Time  4    Period  Weeks    Status  New      PT LONG TERM GOAL #4   Title  Pt will negotiate 4 steps with 1 handrial, with forward descending steps, with min guard assistance.    Time  4    Period  Weeks    Status  New      PT LONG TERM GOAL #5   Title  Pt/husband will verbalize plan for continued optimal fitness upon d/c from PT.    Time  4    Period  Weeks    Status  New  Plan - 02/10/19 1419    Clinical Impression Statement  Addressed longer distance gait and obstacle negotiation today, as well as head turns/environmental scanning with gait.  Pt needs cues for larger head turns to focus on targets for environmental scanning.  Pt does tend to veer towards obstacles on L side, needing cues to swing wider to  avoid obstacles on L side.    Personal Factors and Comorbidities  Comorbidity 3+    Comorbidities  PMH includes ankle fracture, HTN, hyperlipidemia, recurrent falls    Examination-Activity Limitations  Squat;Stairs;Stand;Transfers;Locomotion Level;Sit    Examination-Participation Restrictions  Community Activity    Stability/Clinical Decision Making  Evolving/Moderate complexity    Rehab Potential  Fair   MMSE score 16/30; good family support   PT Frequency  2x / week    PT Duration  4 weeks   per recert 60/45/4098   PT Treatment/Interventions  ADLs/Self Care Home Management;Gait training;DME Instruction;Neuromuscular re-education;Balance training;Therapeutic exercise;Therapeutic activities;Functional mobility training;Patient/family education    PT Next Visit Plan  Next visit is 20th visit-need Progress note; Continue bed mobility, lower surface transfers, stair negotiation; continue to focus on SLS activities, posterior direction stepping/weightshifting    Consulted and Agree with Plan of Care  Patient;Family member/caregiver    Family Member Consulted  Husband       Patient will benefit from skilled therapeutic intervention in order to improve the following deficits and impairments:  Abnormal gait, Difficulty walking, Decreased safety awareness, Decreased balance, Decreased mobility  Visit Diagnosis: Other abnormalities of gait and mobility  Unsteadiness on feet     Problem List Patient Active Problem List   Diagnosis Date Noted  . Fluency disorder associated with underlying disease 02/08/2018  . Cognitive and neurobehavioral dysfunction 02/08/2018  . 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 Arthritis of carpometacarpal [facility]) joint of left thumb 01/24/2018  . Ankle fracture, left 09/03/2016  . Hand dysfunction 03/11/2016  . Symptomatic varicose veins, bilateral 03/11/2016  . Numbness of left thumb 03/11/2016  . Elevated BP 07/27/2014  . Bunion of left foot 07/26/2014  .  Abnormality of gait 07/26/2014  . Cough 11/10/2013  . Post-nasal drainage 11/10/2013  . Abnormal urine odor 11/10/2013  . Visit for preventive health examination 07/28/2013  . Right anterior knee pain 07/22/2012  . Medicare annual wellness visit, subsequent 07/25/2011  . ANXIETY, SITUATIONAL 09/25/2009  . ARTHRITIS 09/25/2009  . Vitamin D deficiency 09/27/2008  . Hyperlipidemia 09/17/2008  . Elevated blood pressure reading without diagnosis of hypertension 09/17/2008  . COLONIC POLYPS, HX OF 09/17/2008  . PERSONAL HISTORY DISEASES SKIN&SUBCUT TISSUE 09/17/2008    Frazier Butt. 02/10/2019, 2:24 PM Frazier Butt., PT [facility] [address] [place], [place], [address] Phone: [phone]   Fax:  [phone]  Name: Kyan Giannone MRN: [id number] Date of Birth: May 31, 1940

## 2019-02-14 ENCOUNTER — Other Ambulatory Visit: Payer: Self-pay

## 2019-02-14 ENCOUNTER — Ambulatory Visit: Payer: Medicare Other | Admitting: Speech Pathology

## 2019-02-14 ENCOUNTER — Ambulatory Visit: Payer: Medicare Other | Admitting: Occupational Therapy

## 2019-02-14 ENCOUNTER — Ambulatory Visit: Payer: Medicare Other | Admitting: Physical Therapy

## 2019-02-14 DIAGNOSIS — R4701 Aphasia: Secondary | ICD-10-CM

## 2019-02-14 DIAGNOSIS — R41841 Cognitive communication deficit: Secondary | ICD-10-CM

## 2019-02-14 DIAGNOSIS — R2689 Other abnormalities of gait and mobility: Secondary | ICD-10-CM

## 2019-02-14 DIAGNOSIS — M6281 Muscle weakness (generalized): Secondary | ICD-10-CM | POA: Diagnosis not present

## 2019-02-14 DIAGNOSIS — R2681 Unsteadiness on feet: Secondary | ICD-10-CM

## 2019-02-14 NOTE — Patient Instructions (Signed)
Provided handouts for forward kick (step taps) x 10 reps, back kick (step taps) x 10 reps  To stand at chair or counter to perform

## 2019-02-14 NOTE — Therapy (Signed)
[facility] [address] [place], [place], [address] Phone: [phone]   Fax:  [phone]  Physical Therapy Treatment  Patient Details  Name: Pamela Alvarez MRN: [id number] Date of Birth: May 11, 1940 Referring Provider (PT): Dohmeier, Asencion Partridge   Encounter Date: 02/14/2019  PT End of Session - 02/14/19 1734    Visit Number  20    Number of Visits  24   per recert [id number]/[id number]   Date for PT Re-Evaluation  03/03/19    Authorization Type  [employer] Medicare-will need 10th visit progress note    PT Start Time  1020    PT Stop Time  1100    PT Time Calculation (min)  40 min    Activity Tolerance  Patient tolerated treatment well   Pt seems to need increased cues throughout activities today   Behavior During Therapy  [facility] for tasks assessed/performed       Past Medical History:  Diagnosis Date  . Cyst 07-2008   Cyst on back x2 that were drained  . H/O echocardiogram 02-2000  . Hx of colonic polyps   . Hyperlipidemia   . Hypertension   . Varicose veins of both lower extremities     Past Surgical History:  Procedure Laterality Date  . POLYPECTOMY     Colon  . TONSILLECTOMY    . TUBAL LIGATION      There were no vitals filed for this visit.  Subjective Assessment - 02/14/19 1022    Subjective  Husband reports that she has gotten up from the sofa several times on her own.  Husband reports that she is still taking smaller steps at home.    Patient is accompained by:  Family member    Patient Stated Goals  Pt's goal for therapy is better balance and walking.    Currently in Pain?  No/denies                       [facility] Adult PT Treatment/Exercise - 02/14/19 0001      Transfers   Transfers  Sit to Stand;Stand to Sit    Sit to Stand  With upper extremity assist;From bed;4: Min guard;4: Min assist    Sit to Stand Details  Verbal cues for technique;Verbal cues for sequencing    Stand to Sit  5:  Supervision;With upper extremity assist;To bed    Stand to Sit Details (indicate cue type and reason)  Verbal cues for sequencing;Verbal cues for technique      Ambulation/Gait   Ambulation/Gait  Yes    Ambulation/Gait Assistance  4: Min guard;5: Supervision    Ambulation/Gait Assistance Details  PT provides verbal cues for increased step length/foot clearance, tactile cues at shoulders and UEs for arm swing with gait.    Ambulation Distance (Feet)  115 Feet   x 5 (3 with rollator, 2 with no device)   Assistive device  Rollator;None    Gait Pattern  Step-through pattern;Decreased step length - right;Decreased step length - left;Shuffle;Decreased dorsiflexion - right;Decreased dorsiflexion - left;Decreased arm swing - left;Decreased arm swing - right    Ambulation Surface  Level;Indoor    Gait velocity  20 sec no device:  1.64 ft/sec; 20.47 sec = 1.6 ft/sec with rollator    Pre-Gait Activities  Activities at chair to focus on increased step length and foot clearance:  forward kick taps (leading with heel strike) x 10 reps each leg; 2nd set of forward kick/step taps, alternating legs.  Back kick step taps x 10 reps each leg; tactile cues at LLE to increase step length.  Standing heel/toe raises x 10 reps, with PT verbal cues and min guard for 3 second hold.    Gait Comments  Cues provided to lessen shuffling steps (can patient hear her feet shuffling?  If so, then take bigger steps for better foot clearance to NOT hear her feet).             PT Education - 02/14/19 1733    Education Details  Added to HEP; cues to take big enough steps to NOT hear her feet scuffing    Person(s) Educated  Patient;Spouse    Methods  Explanation;Demonstration;Handout;Verbal cues    Comprehension  Verbalized understanding;Returned demonstration;Verbal cues required;Need further instruction       PT Short Term Goals - 12/29/18 1103      PT SHORT TERM GOAL #1   Title  Pt will perform HEP with family  supervision for improved transfers, balance, and gait.  UPDATED TARGET 12/30/2018 ( due to later start with later scheduling)    Time  4    Period  Weeks    Status  Achieved      PT SHORT TERM GOAL #2   Title  Pt will perform 4 of 5 reps of sit<>stand transfers with correct technique, with husband supervision and cues, for improved safety with transfers.    Time  4    Period  Weeks    Status  Achieved      PT SHORT TERM GOAL #3   Title  Pt will improve Berg Balance score to at least 24/56 for decreased fall risk.    Baseline  31/56 12/27/2018    Time  4    Period  Weeks    Status  Achieved      PT SHORT TERM GOAL #4   Title  Pt will improve TUG score to less than or equal to 23 seconds for decreased fall risk.    Baseline  10/22:  25.68 sec, 25.88 sec    Time  4    Period  Weeks    Status  Not Met      PT SHORT TERM GOAL #5   Title  Pt/husband will verbalize understanding of fall prevention in home environment.    Time  4    Period  Weeks    Status  Achieved        PT Long Term Goals - 01/29/19 6226      PT LONG TERM GOAL #1   Title  Pt/husband will report improvement in bed mobility sequencing and decreased assistance with supine>sit to minimal assistance, for improved safety with bed mobility.  TARGET 03/02/2019    Time  4    Period  Weeks    Status  New      PT LONG TERM GOAL #2   Title  Pt will improve gait velocity to at  least 1.5 ft/sec with rollator and supervision, for improved gait efficiency.    Baseline  1.73 ft/sec 01/25/2019 (no device); rollator 1.35 ft/sec    Time  4    Period  Weeks    Status  Revised      PT LONG TERM GOAL #3   Title  Pt will perform sit<>stand transfers from surfaces <18", simulating sofa, with verbal cues and minimal assistance or less, for improved transfers at home.    Time  4    Period  Weeks  Status  New      PT LONG TERM GOAL #4   Title  Pt will negotiate 4 steps with 1 handrial, with forward descending steps, with  min guard assistance.    Time  4    Period  Weeks    Status  New      PT LONG TERM GOAL #5   Title  Pt/husband will verbalize plan for continued optimal fitness upon d/c from PT.    Time  4    Period  Weeks    Status  New            Plan - 02/14/19 1736    Clinical Impression Statement  20th visit progress note, spanning dates 01/05/2019-02/14/2019:  Husband reports pt is doing better with bed mobility and with sofa/low surface transfers.  Berg score (from 01/27/2019) 36/56, improved from 31/56; gait velocity 1.64 ft/sec no device, 1.6 ft/sec with rollator (improved from 1.33 ft/sec at eval).  Pt  is progressing towards LTGs, and pt/husband have been carrying over recommendations from PT sessions to home, with funcitonal improvements.  Pt will benefit from 2-3 additional visits to work towards LTGs, with plans for discharge after next week's telehealth visit for home environment/safety assessment recommendations.    Personal Factors and Comorbidities  Comorbidity 3+    Comorbidities  PMH includes ankle fracture, HTN, hyperlipidemia, recurrent falls    Examination-Activity Limitations  Squat;Stairs;Stand;Transfers;Locomotion Level;Sit    Examination-Participation Restrictions  Community Activity    Stability/Clinical Decision Making  Evolving/Moderate complexity    Rehab Potential  Fair   MMSE score 16/30; good family support   PT Frequency  2x / week    PT Duration  4 weeks   per recert 56/21/3086   PT Treatment/Interventions  ADLs/Self Care Home Management;Gait training;DME Instruction;Neuromuscular re-education;Balance training;Therapeutic exercise;Therapeutic activities;Functional mobility training;Patient/family education    PT Next Visit Plan  Check remaining LTGs that need to be checked in clinic; how did new exercises and cues for foot clearance go?  Check new exercises added to HEP.    Consulted and Agree with Plan of Care  Patient;Family member/caregiver    Family Member  Consulted  Husband       Patient will benefit from skilled therapeutic intervention in order to improve the following deficits and impairments:  Abnormal gait, Difficulty walking, Decreased safety awareness, Decreased balance, Decreased mobility  Visit Diagnosis: Other abnormalities of gait and mobility  Unsteadiness on feet     Problem List Patient Active Problem List   Diagnosis Date Noted  . Fluency disorder associated with underlying disease 02/08/2018  . Cognitive and neurobehavioral dysfunction 02/08/2018  . 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 Arthritis of carpometacarpal [facility]) joint of left thumb 01/24/2018  . Ankle fracture, left 09/03/2016  . Hand dysfunction 03/11/2016  . Symptomatic varicose veins, bilateral 03/11/2016  . Numbness of left thumb 03/11/2016  . Elevated BP 07/27/2014  . Bunion of left foot 07/26/2014  . Abnormality of gait 07/26/2014  . Cough 11/10/2013  . Post-nasal drainage 11/10/2013  . Abnormal urine odor 11/10/2013  . Visit for preventive health examination 07/28/2013  . Right anterior knee pain 07/22/2012  . Medicare annual wellness visit, subsequent 07/25/2011  . ANXIETY, SITUATIONAL 09/25/2009  . ARTHRITIS 09/25/2009  . Vitamin D deficiency 09/27/2008  . Hyperlipidemia 09/17/2008  . Elevated blood pressure reading without diagnosis of hypertension 09/17/2008  . COLONIC POLYPS, HX OF 09/17/2008  . PERSONAL HISTORY DISEASES SKIN&SUBCUT TISSUE 09/17/2008    Deeric Cruise W. 02/14/2019, 5:42  PM  [facility]   [facility] [address] [place], [place], [address] Phone: [phone]   Fax:  [phone]  Name: Pamela Alvarez MRN: [id number] Date of Birth: 10-22-1940

## 2019-02-14 NOTE — Therapy (Signed)
[facility] [address] [place], [place], [address] Phone: [phone]   Fax:  [phone]  Speech Language Pathology Treatment and Progress Note  Patient Details  Name: Pamela Alvarez MRN: [id number] Date of Birth: 1940-08-29 Referring Provider (SLP): Dohmeier, Asencion Partridge, MD   Encounter Date: 02/14/2019  End of Session - 02/14/19 1443    Visit Number  20    Number of Visits  33    Date for SLP Re-Evaluation  05/01/19    SLP Start Time  2778    SLP Stop Time   1229    SLP Time Calculation (min)  44 min    Activity Tolerance  Patient tolerated treatment well       Past Medical History:  Diagnosis Date  . Cyst 07-2008   Cyst on back x2 that were drained  . H/O echocardiogram 02-2000  . Hx of colonic polyps   . Hyperlipidemia   . Hypertension   . Varicose veins of both lower extremities     Past Surgical History:  Procedure Laterality Date  . POLYPECTOMY     Colon  . TONSILLECTOMY    . TUBAL LIGATION      There were no vitals filed for this visit.  Subjective Assessment - 02/14/19 1439    Subjective  "It is cold."    Currently in Pain?  No/denies            ADULT SLP TREATMENT - 02/14/19 1439      General Information   Behavior/Cognition  Alert;Cooperative;Pleasant mood      Treatment Provided   Treatment provided  Cognitive-Linquistic      Pain Assessment   Pain Assessment  No/denies pain      Cognitive-Linquistic Treatment   Treatment focused on  Aphasia;Patient/family/caregiver education    Skilled Treatment  Patient brought binder with pictures of family and home; no photos of travel as previously requested. SLP used visual images with patient to faciliate/enhance conversation re: topic of interest (pieces of artwork in her home). Used map to discuss locations/details about places patient has traveled. Patient generated simple sentence responses and comments in response to SLP stimuli; SLP  continues to carry conversational load. SLP pointed out to patient/husband that use of images allowed pt to participate more fully in conversation and encouraged them to bring additional photos to next session/practice this at home.      Assessment / Recommendations / Plan   Plan  Continue with current plan of care      Progression Toward Goals   Progression toward goals  Progressing toward goals       SLP Education - 02/14/19 1443    Education Details  use images to assist patient with providing more details in conversation    Person(s) Educated  Patient;Spouse    Methods  Explanation    Comprehension  Verbalized understanding       SLP Short Term Goals - 02/14/19 1444      SLP SHORT TERM GOAL #1   Title  pt will demo ability for functionalality with simple household langauge tasks (e.g., grocery list, errand list, to-do list) over 3 sessions    Time  1    Period  Weeks    Status  Not Met      SLP SHORT TERM GOAL #2   Title  pt will achieve functional communication of wants/needs at home (reported, if needed) over two sessions    Time  1    Period  Weeks    Status  Not Met      SLP SHORT TERM GOAL #3   Title  pt will demo correct right/left comprehension in simple functional commands 9/10 opportunities    Time  1    Period  Weeks    Status  Not Met      SLP SHORT TERM GOAL #4   Title  pt will complete full aphasia eval before total visit #3    Baseline  form 1 completed 12/06/18    Status  Achieved      SLP SHORT TERM GOAL #5   Title  pt will complete cognitive linguistic eval PRN if deemed clinically necessary    Baseline  started 12/29/18    Time  1    Period  Weeks    Status  Partially Met       SLP Long Term Goals - 02/14/19 1444      SLP LONG TERM GOAL #1   Title  pt will demonstrate improved verbal communication (as rated by pt) compared to when ST began    [place] #2   Title  pt will report speech and  language is less frustrating than prior to initiation of ST    Time  1    Period  Weeks    Status  Achieved      SLP LONG TERM GOAL #3   Title  pt will produce functional verbal expression in 3 minutes simple conversation    Baseline  02/07/19 02/09/19 02/14/19    Time  4   renewed 11/24   Period  Weeks    Status  Achieved   omitted "over three sessions"     SLP LONG TERM GOAL #4   Title  pt will use multimodal communication PRN to express wants/needs    Time  4   renewed 11/24   Period  Weeks    Status  On-going      SLP LONG TERM GOAL #5   Title  pt will functionally write her first name independently in 7/10 attempts over 3 sessions    Time  4   renewed 11/24   Period  Weeks    Status  On-going      SLP LONG TERM GOAL #6   Title  Patient will initiate/maintain conversation by asking a question or making a comment at least twice during a 5 minute exchange, x3 visits.    Time  4    Period  Weeks    Status  New       Plan - 02/14/19 1443    Clinical Impression Statement  Pt continues to present as an atypical TBI pt or CVA pt with mild-moderate (more moderate) anomic aphasia per [employer] Aphasia Battery-Revised; cognitive deficits including visuospatial skills, organization, slow processing ID'd per CLQT. See "skilled intervention" for details on today's session. Pt continues to make gains in simple conversation in therapy room as well as improved communication with family members outside of [place]. SLP recommends cont'd course of skilled ST to address pt's decr'd language-cogniton ability/possible verbal apraxia to incr. pt independence.    Speech Therapy Frequency  2x / week    Duration  --   8 weeks or 17 visits   Treatment/Interventions  SLP instruction and feedback;Compensatory strategies;Patient/family education;Multimodal communcation approach;Cueing hierarchy;Language facilitation;Functional tasks;Oral motor exercises;Internal/external aids;Environmental controls;Cognitive  reorganization    Potential to Achieve  Goals  Fair    Potential Considerations  Severity of impairments       Patient will benefit from skilled therapeutic intervention in order to improve the following deficits and impairments:   Aphasia  Cognitive communication deficit    Problem List Patient Active Problem List   Diagnosis Date Noted  . Fluency disorder associated with underlying disease 02/08/2018  . Cognitive and neurobehavioral dysfunction 02/08/2018  . 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 Arthritis of carpometacarpal [facility]) joint of left thumb 01/24/2018  . Ankle fracture, left 09/03/2016  . Hand dysfunction 03/11/2016  . Symptomatic varicose veins, bilateral 03/11/2016  . Numbness of left thumb 03/11/2016  . Elevated BP 07/27/2014  . Bunion of left foot 07/26/2014  . Abnormality of gait 07/26/2014  . Cough 11/10/2013  . Post-nasal drainage 11/10/2013  . Abnormal urine odor 11/10/2013  . Visit for preventive health examination 07/28/2013  . Right anterior knee pain 07/22/2012  . Medicare annual wellness visit, subsequent 07/25/2011  . ANXIETY, SITUATIONAL 09/25/2009  . ARTHRITIS 09/25/2009  . Vitamin D deficiency 09/27/2008  . Hyperlipidemia 09/17/2008  . Elevated blood pressure reading without diagnosis of hypertension 09/17/2008  . COLONIC POLYPS, HX OF 09/17/2008  . PERSONAL HISTORY DISEASES SKIN&SUBCUT TISSUE 09/17/2008     Speech Therapy Progress Note  Dates of Reporting Period: Patient has been seen for 20 speech therapy sessions targeting aphasia and cognitive linguistic skills.  See goals and clinical impressions above. Patient is progressing toward long term goals.  Plan: Continue skilled ST to increase patient's ability to communicate wants and needs and participate in social exchanges with family and friends.  Deneise Lever, [place], CCC-SLP Speech-Language Pathologist  Aliene Altes 02/14/2019, 2:46 PM  [facility] [address] [place], [place], [address] Phone: [phone]   Fax:  [phone]   Name: Pamela Alvarez MRN: [id number] Date of Birth: 27-Jan-1941

## 2019-02-16 ENCOUNTER — Encounter: Payer: Medicare Other | Admitting: Occupational Therapy

## 2019-02-16 ENCOUNTER — Encounter: Payer: Self-pay | Admitting: Physical Therapy

## 2019-02-16 ENCOUNTER — Ambulatory Visit: Payer: Medicare Other | Admitting: Physical Therapy

## 2019-02-16 ENCOUNTER — Ambulatory Visit: Payer: Medicare Other | Admitting: Speech Pathology

## 2019-02-16 ENCOUNTER — Other Ambulatory Visit: Payer: Self-pay

## 2019-02-16 DIAGNOSIS — R4701 Aphasia: Secondary | ICD-10-CM

## 2019-02-16 DIAGNOSIS — R41841 Cognitive communication deficit: Secondary | ICD-10-CM

## 2019-02-16 DIAGNOSIS — R2681 Unsteadiness on feet: Secondary | ICD-10-CM

## 2019-02-16 DIAGNOSIS — R2689 Other abnormalities of gait and mobility: Secondary | ICD-10-CM

## 2019-02-16 DIAGNOSIS — M6281 Muscle weakness (generalized): Secondary | ICD-10-CM | POA: Diagnosis not present

## 2019-02-16 NOTE — Therapy (Signed)
[facility] [address] [place], [place], [address] Phone: [phone]   Fax:  [phone]  Physical Therapy Treatment  Patient Details  Name: Pamela Alvarez MRN: [id number] Date of Birth: 12-Jul-1940 Referring Provider (PT): Dohmeier, Asencion Partridge   Encounter Date: 02/16/2019  PT End of Session - 02/16/19 1051    Visit Number  21    Number of Visits  24   per recert 13/24/4010   Date for PT Re-Evaluation  03/03/19    Authorization Type  [employer] Medicare-will need 10th visit progress note    PT Start Time  0933    PT Stop Time  1014    PT Time Calculation (min)  41 min    Activity Tolerance  Patient tolerated treatment well   Pt seems to need increased cues throughout activities today   Behavior During Therapy  [facility] for tasks assessed/performed       Past Medical History:  Diagnosis Date  . Cyst 07-2008   Cyst on back x2 that were drained  . H/O echocardiogram 02-2000  . Hx of colonic polyps   . Hyperlipidemia   . Hypertension   . Varicose veins of both lower extremities     Past Surgical History:  Procedure Laterality Date  . POLYPECTOMY     Colon  . TONSILLECTOMY    . TUBAL LIGATION      There were no vitals filed for this visit.  Subjective Assessment - 02/16/19 0936    Subjective  Husband reports that she's been "moving all around the house!"    Patient is accompained by:  Family member    Patient Stated Goals  Pt's goal for therapy is better balance and walking.    Currently in Pain?  No/denies                       [facility] Adult PT Treatment/Exercise - 02/16/19 0001      Transfers   Transfers  Sit to Stand;Stand to Sit    Sit to Stand  With upper extremity assist;From bed;4: Min guard;4: Min assist;From chair/3-in-1    Sit to Stand Details  Verbal cues for technique;Verbal cues for sequencing    Stand to Sit  5: Supervision;With upper extremity assist;To bed    Stand to Sit Details  (indicate cue type and reason)  Verbal cues for sequencing;Verbal cues for technique    Comments  Pt performs sit<>stand from mat x 5 reps, then x 2 reps from 16" chair (with transition mat>chair, pt begins to sit too early, not turning fully around to square up to chair).  Additiona sit<>stand at least 4 reps through session, using rollator following bouts of gait.  With rollator, pt demo good turn, back up and locks brakes prior to reaching back to sit.  Dicussed value and benefit of husband's cues for improved safety with transfers at home.      Ambulation/Gait   Ambulation/Gait  Yes    Ambulation/Gait Assistance  4: Min guard;5: Supervision    Ambulation/Gait Assistance Details  PT provides several cues throughout gait in session for increased step length, to no hear her foot sliding along the floor.    Ambulation Distance (Feet)  500 Feet   rollator; then 130 ft no device with min guard   Assistive device  Rollator;None    Gait Pattern  Step-through pattern;Decreased step length - right;Decreased step length - left;Shuffle;Decreased dorsiflexion - right;Decreased dorsiflexion - left;Decreased arm swing - left;Decreased  arm swing - right    Ambulation Surface  Level;Indoor    Gait velocity  16.82 sec rollator (1.95 ft/sec); 17.87 sec = 1.83 ft/sec with no device    Stairs  Yes    Stairs Assistance  4: Min guard;4: Min assist   min assist at hips for descending stairs   Stairs Assistance Details (indicate cue type and reason)  Cues to slide arms forward along rails when descending to decrease posterior lean.    Stair Management Technique  Two rails;Step to pattern;Forwards   Wants to angle legs sideways, cues for forward descent   Number of Stairs  4   x 2   Height of Stairs  6    Pre-Gait Activities  Reviewed recent additions to HEP, at locked rollator:  forward kick/step taps x 10 reps each leg; back kick/step taps x 10 reps each leg.  Pt return demo understanding with cues provided.     Gait Comments  Additional 100 ft x 2 with rollator with supervision.      Self-Care   Self-Care  Other Self-Care Comments    Other Self-Care Comments   Discussed progress towards goals, plans for d/c after next telehealth visit (02/24/2019).  Husband asks about return to therapy in January-PT discussed upon d/c after next visit, to give pt 2-3 months working on exercises and walking at home and potentially discuss return to therapy with MD at that time, or if significant functional changes occur.  Discussed walking program (pt is up to 10 minutes in home with rollator) and how she can continue to progress that in the [place] with husband's supervision and use of rollator.               PT Short Term Goals - 12/29/18 1103      PT SHORT TERM GOAL #1   Title  Pt will perform HEP with family supervision for improved transfers, balance, and gait.  UPDATED TARGET 12/30/2018 ( due to later start with later scheduling)    Time  4    Period  Weeks    Status  Achieved      PT SHORT TERM GOAL #2   Title  Pt will perform 4 of 5 reps of sit<>stand transfers with correct technique, with husband supervision and cues, for improved safety with transfers.    Time  4    Period  Weeks    Status  Achieved      PT SHORT TERM GOAL #3   Title  Pt will improve Berg Balance score to at least 24/56 for decreased fall risk.    Baseline  31/56 12/27/2018    Time  4    Period  Weeks    Status  Achieved      PT SHORT TERM GOAL #4   Title  Pt will improve TUG score to less than or equal to 23 seconds for decreased fall risk.    Baseline  10/22:  25.68 sec, 25.88 sec    Time  4    Period  Weeks    Status  Not Met      PT SHORT TERM GOAL #5   Title  Pt/husband will verbalize understanding of fall prevention in home environment.    Time  4    Period  Weeks    Status  Achieved        PT Long Term Goals - 02/16/19 2409      PT LONG TERM GOAL #1   Title  Pt/husband will report  improvement in bed mobility sequencing and decreased assistance with supine>sit to minimal assistance, for improved safety with bed mobility.  TARGET 03/02/2019    Time  4    Period  Weeks    Status  New      PT LONG TERM GOAL #2   Title  Pt will improve gait velocity to at  least 1.5 ft/sec with rollator and supervision, for improved gait efficiency.    Baseline  1.73 ft/sec 01/25/2019 (no device); rollator 1.35 ft/sec    Time  4    Period  Weeks    Status  Achieved      PT LONG TERM GOAL #3   Title  Pt will perform sit<>stand transfers from surfaces <18", simulating sofa, with verbal cues and minimal assistance or less, for improved transfers at home.    Time  4    Period  Weeks    Status  Partially Met      PT LONG TERM GOAL #4   Title  Pt will negotiate 4 steps with 1 handrial, with forward descending steps, with min guard assistance.    Baseline  min assist and cues 2 handrails    Time  4    Period  Weeks    Status  Not Met      PT LONG TERM GOAL #5   Title  Pt/husband will verbalize plan for continued optimal fitness upon d/c from PT.    Time  4    Period  Weeks    Status  New            Plan - 02/16/19 1052    Clinical Impression Statement  Began asssessing LTGs this visit, as this is last in-clinic PT visit (final PT visit will be by telehealth next week).  Pt has met LTG 2 for improved gait velocity with rollator.  LTG 3 partially met for improved transfers from low surfaces (pt still needs min assist and cues in therapy session, but husband reports she is getting up from sofa on her own at home).  LTG 4 not met, as pt conitnues to need min assist, cues and B handrails in clinic (at home husband provides appropriate assistance with their one rail).  Pt will benefit from additional 1 PT visit to assess home evnrionment for safety/fall prevention recommendations and review ongoing fitness/exercise plans.    Personal Factors and Comorbidities  Comorbidity 3+     Comorbidities  PMH includes ankle fracture, HTN, hyperlipidemia, recurrent falls    Examination-Activity Limitations  Squat;Stairs;Stand;Transfers;Locomotion Level;Sit    Examination-Participation Restrictions  Community Activity    Stability/Clinical Decision Making  Evolving/Moderate complexity    Rehab Potential  Fair   MMSE score 16/30; good family support   PT Frequency  2x / week    PT Duration  4 weeks   per recert 93/90/3009   PT Treatment/Interventions  ADLs/Self Care Home Management;Gait training;DME Instruction;Neuromuscular re-education;Balance training;Therapeutic exercise;Therapeutic activities;Functional mobility training;Patient/family education    PT Next Visit Plan  Telehealth visit for home environment assessment; check remaining goals and plan for d/c.    Consulted and Agree with Plan of Care  Patient;Family member/caregiver    Family Member Consulted  Husband       Patient will benefit from skilled therapeutic intervention in order to improve the following deficits and impairments:  Abnormal gait, Difficulty walking, Decreased safety awareness, Decreased balance, Decreased mobility  Visit Diagnosis: Other abnormalities of gait and mobility  Unsteadiness on feet  Problem List Patient Active Problem List   Diagnosis Date Noted  . Fluency disorder associated with underlying disease 02/08/2018  . Cognitive and neurobehavioral dysfunction 02/08/2018  . 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 Arthritis of carpometacarpal [facility]) joint of left thumb 01/24/2018  . Ankle fracture, left 09/03/2016  . Hand dysfunction 03/11/2016  . Symptomatic varicose veins, bilateral 03/11/2016  . Numbness of left thumb 03/11/2016  . Elevated BP 07/27/2014  . Bunion of left foot 07/26/2014  . Abnormality of gait 07/26/2014  . Cough 11/10/2013  . Post-nasal drainage 11/10/2013  . Abnormal urine odor 11/10/2013  . Visit for preventive health examination 07/28/2013  . Right  anterior knee pain 07/22/2012  . Medicare annual wellness visit, subsequent 07/25/2011  . ANXIETY, SITUATIONAL 09/25/2009  . ARTHRITIS 09/25/2009  . Vitamin D deficiency 09/27/2008  . Hyperlipidemia 09/17/2008  . Elevated blood pressure reading without diagnosis of hypertension 09/17/2008  . COLONIC POLYPS, HX OF 09/17/2008  . PERSONAL HISTORY DISEASES SKIN&SUBCUT TISSUE 09/17/2008    MARRIOTT,AMY W. 02/16/2019, 10:56 AM  Frazier Butt., PT  [facility] [address] [place], [place], [address] Phone: [phone]   Fax:  [phone]  Name: Pamela Alvarez MRN: [id number] Date of Birth: 09/24/1940

## 2019-02-16 NOTE — Therapy (Signed)
[name] [address] [place], [place], [address] Phone: [phone]   Fax:  [phone]  Speech Language Pathology Treatment  Patient Details  Name: Pamela Alvarez MRN: [id number] Date of Birth: Feb 29, 1941 Referring Provider (SLP): Dohmeier, Asencion Partridge, MD   Encounter Date: 02/16/2019  End of Session - 02/16/19 1106    Visit Number  21    Number of Visits  33    Date for SLP Re-Evaluation  05/01/19    SLP Start Time  63    SLP Stop Time   1145    SLP Time Calculation (min)  43 min    Activity Tolerance  Patient tolerated treatment well       Past Medical History:  Diagnosis Date  . Cyst 07-2008   Cyst on back x2 that were drained  . H/O echocardiogram 02-2000  . Hx of colonic polyps   . Hyperlipidemia   . Hypertension   . Varicose veins of both lower extremities     Past Surgical History:  Procedure Laterality Date  . POLYPECTOMY     Colon  . TONSILLECTOMY    . TUBAL LIGATION      There were no vitals filed for this visit.  Subjective Assessment - 02/16/19 1105    Subjective  "The wheelchair is not... public."    Currently in Pain?  No/denies            ADULT SLP TREATMENT - 02/16/19 1216      General Information   Behavior/Cognition  Alert;Cooperative;Pleasant mood      Treatment Provided   Treatment provided  Cognitive-Linquistic      Cognitive-Linquistic Treatment   Treatment focused on  Aphasia;Patient/family/caregiver education    Skilled Treatment  Patient brought binder with pictures of family and home; again no photos of travel as previously requested. SLP used written keywords to facilitate simple conversation re: patient's recent visit to see her sister in a nearby assisted living facility. Patient referenced keywords by pointing and also used keywords in complete sentences x4 in 5 minutes conversation. SLP educated pt/husband re: update to goals for pt to initiate x2 in  conversation with question or comment. In subsequent conversation re: mental health in [place], pt asked SLP 1 question and made an appropriate comment x1. SLP worked with patient on writing her name. First attempt was legible (final 'a' in [place] omitted), but subsequent attempts (self-formulation and copying) were less so; patient also unable to keep text in line. Used visual cues (windows cut in colored paper), marginal success with usual mod-max A (SLP spelling name aloud with patient as she wrote).      Assessment / Recommendations / Plan   Plan  Continue with current plan of care      Progression Toward Goals   Progression toward goals  Progressing toward goals       SLP Education - 02/16/19 1230    Education Details  How to practice writing name at home    Person(s) Educated  Patient;Spouse    Methods  Explanation    Comprehension  Verbalized understanding       SLP Short Term Goals - 02/14/19 1444      SLP SHORT TERM GOAL #1   Title  pt will demo ability for functionalality with simple household langauge tasks (e.g., grocery list, errand list, to-do list) over 3 sessions    Time  1    Period  Weeks    Status  Not Met  SLP SHORT TERM GOAL #2   Title  pt will achieve functional communication of wants/needs at home (reported, if needed) over two sessions    Time  1    Period  Weeks    Status  Not Met      SLP SHORT TERM GOAL #3   Title  pt will demo correct right/left comprehension in simple functional commands 9/10 opportunities    Time  1    Period  Weeks    Status  Not Met      SLP SHORT TERM GOAL #4   Title  pt will complete full aphasia eval before total visit #3    Baseline  form 1 completed 12/06/18    Status  Achieved      SLP SHORT TERM GOAL #5   Title  pt will complete cognitive linguistic eval PRN if deemed clinically necessary    Baseline  started 12/29/18    Time  1    Period  Weeks    Status  Partially Met       SLP Long Term Goals - 02/16/19 1107       SLP LONG TERM GOAL #1   Title  pt will demonstrate improved verbal communication (as rated by pt) compared to when ST began    Period  Weeks    Status  Achieved      SLP [place] #2   Title  pt will report speech and language is less frustrating than prior to initiation of ST    Time  1    Period  Weeks    Status  Achieved      SLP LONG TERM GOAL #3   Title  pt will produce functional verbal expression in 3 minutes simple conversation    Baseline  02/07/19 02/09/19 02/14/19    Time  4   renewed 11/24   Period  Weeks    Status  Achieved   omitted "over three sessions"     SLP LONG TERM GOAL #4   Title  pt will use multimodal communication PRN to express wants/needs    Time  4   renewed 11/24   Period  Weeks    Status  On-going      SLP LONG TERM GOAL #5   Title  pt will functionally write her first name independently in 7/10 attempts over 3 sessions    Time  4   renewed 11/24   Period  Weeks    Status  On-going      SLP LONG TERM GOAL #6   Title  Patient will initiate/maintain conversation by asking a question or making a comment at least twice during a 5 minute exchange, x3 visits.    Time  4    Period  Weeks    Status  New       Plan - 02/16/19 1231    Clinical Impression Statement  Pt continues to present as an atypical TBI pt or CVA pt with mild-moderate (more moderate) anomic aphasia per [employer] Aphasia Battery-Revised; cognitive deficits including visuospatial skills, organization, slow processing ID'd per CLQT. See "skilled intervention" for details on today's session. Pt continues to make gains in simple conversation in therapy room as well as improved communication with family members outside of [place]. SLP recommends cont'd course of skilled ST to address pt's decr'd language-cogniton ability/possible verbal apraxia to incr. pt independence.    Speech Therapy Frequency  2x / week    Duration  --  8 weeks or 17 visits   Treatment/Interventions  SLP  instruction and feedback;Compensatory strategies;Patient/family education;Multimodal communcation approach;Cueing hierarchy;Language facilitation;Functional tasks;Oral motor exercises;Internal/external aids;Environmental controls;Cognitive reorganization    Potential to Achieve Goals  Fair    Potential Considerations  Severity of impairments       Patient will benefit from skilled therapeutic intervention in order to improve the following deficits and impairments:   Aphasia  Cognitive communication deficit    Problem List Patient Active Problem List   Diagnosis Date Noted  . Fluency disorder associated with underlying disease 02/08/2018  . Cognitive and neurobehavioral dysfunction 02/08/2018  . 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 Arthritis of carpometacarpal [facility]) joint of left thumb 01/24/2018  . Ankle fracture, left 09/03/2016  . Hand dysfunction 03/11/2016  . Symptomatic varicose veins, bilateral 03/11/2016  . Numbness of left thumb 03/11/2016  . Elevated BP 07/27/2014  . Bunion of left foot 07/26/2014  . Abnormality of gait 07/26/2014  . Cough 11/10/2013  . Post-nasal drainage 11/10/2013  . Abnormal urine odor 11/10/2013  . Visit for preventive health examination 07/28/2013  . Right anterior knee pain 07/22/2012  . Medicare annual wellness visit, subsequent 07/25/2011  . ANXIETY, SITUATIONAL 09/25/2009  . ARTHRITIS 09/25/2009  . Vitamin D deficiency 09/27/2008  . Hyperlipidemia 09/17/2008  . Elevated blood pressure reading without diagnosis of hypertension 09/17/2008  . COLONIC POLYPS, HX OF 09/17/2008  . PERSONAL HISTORY DISEASES SKIN&SUBCUT TISSUE 09/17/2008   Deneise Lever, [place], CCC-SLP Speech-Language Pathologist  Aliene Altes 02/16/2019, 12:31 PM  [facility] [address] [place], [place], [address] Phone: [phone]   Fax:  [phone]   Name: Pamela Alvarez MRN: [id number] Date of  Birth: 11/30/1940

## 2019-02-21 ENCOUNTER — Ambulatory Visit: Payer: Medicare Other | Admitting: Speech Pathology

## 2019-02-24 ENCOUNTER — Ambulatory Visit: Payer: Medicare Other | Admitting: Physical Therapy

## 2019-02-28 ENCOUNTER — Encounter: Payer: Medicare Other | Admitting: Speech Pathology

## 2019-03-07 ENCOUNTER — Ambulatory Visit: Payer: Medicare Other | Admitting: Speech Pathology

## 2019-03-07 ENCOUNTER — Other Ambulatory Visit: Payer: Self-pay

## 2019-03-07 ENCOUNTER — Encounter: Payer: Medicare Other | Admitting: Speech Pathology

## 2019-03-07 DIAGNOSIS — R41841 Cognitive communication deficit: Secondary | ICD-10-CM

## 2019-03-07 DIAGNOSIS — M6281 Muscle weakness (generalized): Secondary | ICD-10-CM | POA: Diagnosis not present

## 2019-03-07 DIAGNOSIS — R4701 Aphasia: Secondary | ICD-10-CM

## 2019-03-09 ENCOUNTER — Encounter: Payer: Medicare Other | Admitting: Speech Pathology

## 2019-03-09 NOTE — Therapy (Signed)
[facility] [address] [place], [place], [address] Phone: [phone]   Fax:  [phone]  Speech Language Pathology Treatment  Patient Details  Name: Pamela Alvarez MRN: [id number] Date of Birth: October 28, 1940 Referring Provider (SLP): Dohmeier, Asencion Partridge, MD   Encounter Date: 03/07/2019  End of Session - 03/09/19 0801    Visit Number  22    Number of Visits  33    Date for SLP Re-Evaluation  05/01/19    SLP Start Time  1700    SLP Stop Time   3785    SLP Time Calculation (min)  45 min    Activity Tolerance  Patient tolerated treatment well       Past Medical History:  Diagnosis Date  . Cyst 07-2008   Cyst on back x2 that were drained  . H/O echocardiogram 02-2000  . Hx of colonic polyps   . Hyperlipidemia   . Hypertension   . Varicose veins of both lower extremities     Past Surgical History:  Procedure Laterality Date  . POLYPECTOMY     Colon  . TONSILLECTOMY    . TUBAL LIGATION      There were no vitals filed for this visit.  Subjective Assessment - 03/09/19 0752    Subjective  "It was... good." (re: trip up [place])    Patient is accompained by:  Family member    Currently in Pain?  No/denies            ADULT SLP TREATMENT - 03/09/19 0753      General Information   Behavior/Cognition  Alert;Cooperative;Pleasant mood      Treatment Provided   Treatment provided  Cognitive-Linquistic      Pain Assessment   Pain Assessment  No/denies pain      Cognitive-Linquistic Treatment   Treatment focused on  Aphasia;Patient/family/caregiver education    Skilled Treatment  Patient had to cancel several appointments due to family death and trip to [place]. Husband reports patient had some conversations with family members while there and they were pleased with how she was able to participate. Feel she continues to improve her verbal expression. SLP worked today with pt on LTGs including conversation  initiation/maintenance, anomia compensations. Pt asked SLP 1 question during conversation and made unprompted comment x1. Otherwise, conversational burden remained on SLP. Writing name: SLP used several strategies including visual cues, imitation. Most success was with SLP using hand-over-hand assist for pt tracing her first name; she was then able to write her last name legibly without assist. Unlined paper vs lined paper may be beneficial to reduce visual distractions/stimuli.      Assessment / Recommendations / Plan   Plan  Continue with current plan of care      Progression Toward Goals   Progression toward goals  Progressing toward goals         SLP Short Term Goals - 03/09/19 0755      SLP SHORT TERM GOAL #1   Title  pt will demo ability for functionalality with simple household langauge tasks (e.g., grocery list, errand list, to-do list) over 3 sessions    Time  1    Period  Weeks    Status  Not Met      SLP SHORT TERM GOAL #2   Title  pt will achieve functional communication of wants/needs at home (reported, if needed) over two sessions    Time  1    Period  Weeks    Status  Not Met      SLP SHORT TERM GOAL #3   Title  pt will demo correct right/left comprehension in simple functional commands 9/10 opportunities    Time  1    Period  Weeks    Status  Not Met      SLP SHORT TERM GOAL #4   Title  pt will complete full aphasia eval before total visit #3    Baseline  form 1 completed 12/06/18    Status  Achieved      SLP SHORT TERM GOAL #5   Title  pt will complete cognitive linguistic eval PRN if deemed clinically necessary    Baseline  started 12/29/18    Time  1    Period  Weeks    Status  Partially Met       SLP Long Term Goals - 03/09/19 0755      SLP LONG TERM GOAL #1   Title  pt will demonstrate improved verbal communication (as rated by pt) compared to when ST began    [place] #2   Title  pt will report  speech and language is less frustrating than prior to initiation of ST    Time  1    Period  Weeks    Status  Achieved      SLP LONG TERM GOAL #3   Title  pt will produce functional verbal expression in 3 minutes simple conversation    Baseline  02/07/19 02/09/19 02/14/19    Time  4   renewed 11/24   Period  Weeks    Status  Achieved   omitted "over three sessions"     SLP LONG TERM GOAL #4   Title  pt will use multimodal communication PRN to express wants/needs    Time  3   renewed 11/24   Period  Weeks    Status  On-going      SLP LONG TERM GOAL #5   Title  pt will functionally write her first name independently in 7/10 attempts over 3 sessions    Time  3   renewed 11/24   Period  Weeks    Status  On-going      SLP LONG TERM GOAL #6   Title  Patient will initiate/maintain conversation by asking a question or making a comment at least twice during a 5 minute exchange, x3 visits.    Time  3    Period  Weeks    Status  On-going       Plan - 03/09/19 0802    Clinical Impression Statement  Pt continues to present as an atypical TBI pt or CVA pt with mild-moderate (more moderate) anomic aphasia per Western Aphasia Battery-Revised; cognitive deficits including visuospatial skills, organization, slow processing ID'd per CLQT. See "skilled intervention" for details on today's session. Pt continues to make gains in simple conversation in therapy room as well as improved communication with family members outside of [place]. SLP recommends cont'd course of skilled ST to address pt's decr'd language-cogniton ability/possible verbal apraxia to incr. pt independence.    Speech Therapy Frequency  2x / week    Duration  --   8 weeks or 17 visits   Treatment/Interventions  SLP instruction and feedback;Compensatory strategies;Patient/family education;Multimodal communcation approach;Cueing hierarchy;Language facilitation;Functional tasks;Oral motor exercises;Internal/external aids;Environmental  controls;Cognitive reorganization    Potential to [place]  Potential Considerations  Severity of impairments       Patient will benefit from skilled therapeutic intervention in order to improve the following deficits and impairments:   Aphasia  Cognitive communication deficit    Problem List Patient Active Problem List   Diagnosis Date Noted  . Fluency disorder associated with underlying disease 02/08/2018  . Cognitive and neurobehavioral dysfunction 02/08/2018  . 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 Arthritis of carpometacarpal [facility]) joint of left thumb 01/24/2018  . Ankle fracture, left 09/03/2016  . Hand dysfunction 03/11/2016  . Symptomatic varicose veins, bilateral 03/11/2016  . Numbness of left thumb 03/11/2016  . Elevated BP 07/27/2014  . Bunion of left foot 07/26/2014  . Abnormality of gait 07/26/2014  . Cough 11/10/2013  . Post-nasal drainage 11/10/2013  . Abnormal urine odor 11/10/2013  . Visit for preventive health examination 07/28/2013  . Right anterior knee pain 07/22/2012  . Medicare annual wellness visit, subsequent 07/25/2011  . ANXIETY, SITUATIONAL 09/25/2009  . ARTHRITIS 09/25/2009  . Vitamin D deficiency 09/27/2008  . Hyperlipidemia 09/17/2008  . Elevated blood pressure reading without diagnosis of hypertension 09/17/2008  . COLONIC POLYPS, HX OF 09/17/2008  . PERSONAL HISTORY DISEASES SKIN&SUBCUT TISSUE 09/17/2008   Deneise Lever, [place], CCC-SLP Speech-Language Pathologist  Aliene Altes 03/09/2019, 8:03 AM  [facility] [address] [place], [place], [address] Phone: [phone]   Fax:  [phone]   Name: Pamela Alvarez MRN: [id number] Date of Birth: 1940-10-09

## 2019-03-21 ENCOUNTER — Other Ambulatory Visit: Payer: Self-pay

## 2019-03-21 ENCOUNTER — Ambulatory Visit: Payer: Medicare PPO | Attending: Neurology | Admitting: Speech Pathology

## 2019-03-21 DIAGNOSIS — M6281 Muscle weakness (generalized): Secondary | ICD-10-CM | POA: Diagnosis not present

## 2019-03-21 DIAGNOSIS — R41841 Cognitive communication deficit: Secondary | ICD-10-CM | POA: Diagnosis not present

## 2019-03-21 DIAGNOSIS — R2681 Unsteadiness on feet: Secondary | ICD-10-CM | POA: Diagnosis not present

## 2019-03-21 DIAGNOSIS — R2689 Other abnormalities of gait and mobility: Secondary | ICD-10-CM | POA: Insufficient documentation

## 2019-03-21 DIAGNOSIS — R4701 Aphasia: Secondary | ICD-10-CM | POA: Insufficient documentation

## 2019-03-21 NOTE — Patient Instructions (Signed)
Have a conversation at least once a day:  While you are watching the news, write down some key words (topics, people, places) on the white board. Have a conversation with Florabelle about the news. Let her point to key words on the white board to help keep the conversation going. Ask open-ended questions, "What do you think about X?" to invite longer responses. Confirm/rephrase the message if you are unsure of her message; Kashawna can give thumbs up or down to let you know.

## 2019-03-21 NOTE — Therapy (Signed)
[facility] [address] [place], [place], [address] Phone: [phone]   Fax:  [phone]  Speech Language Pathology Treatment  Patient Details  Name: Pamela Alvarez MRN: [id number] Date of Birth: [birth year] Referring Provider (SLP): Dohmeier, Asencion Partridge, MD   Encounter Date: 03/21/2019  End of Session - 03/21/19 1315    Visit Number  23    Number of Visits  33    Date for SLP Re-Evaluation  05/01/19    SLP Start Time  1016    SLP Stop Time   1100    SLP Time Calculation (min)  44 min    Activity Tolerance  Patient tolerated treatment well       Past Medical History:  Diagnosis Date  . Cyst 07-2008   Cyst on back x2 that were drained  . H/O echocardiogram 02-2000  . Hx of colonic polyps   . Hyperlipidemia   . Hypertension   . Varicose veins of both lower extremities     Past Surgical History:  Procedure Laterality Date  . POLYPECTOMY     Colon  . TONSILLECTOMY    . TUBAL LIGATION      There were no vitals filed for this visit.  Subjective Assessment - 03/21/19 1018    Subjective  "Did it go well?"    Patient is accompained by:  Family member    Currently in Pain?  No/denies            ADULT SLP TREATMENT - 03/21/19 1314      General Information   Behavior/Cognition  Alert;Cooperative;Pleasant mood      Treatment Provided   Treatment provided  Cognitive-Linquistic      Pain Assessment   Pain Assessment  No/denies pain      Cognitive-Linquistic Treatment   Treatment focused on  Aphasia;Patient/family/caregiver education    Skilled Treatment  Patient's husband asked SLP about referral for ongoing ST as concern that pt might "regress" if not coming for therapy regularly. SLP educated that patient has made progress; must continue to see gains and need for ST intervention to be skilled vs "maintenance" to warrant continuing ST. Explained that anticipate goals met/education complete in next 2-4  sessions, but that pt may benefit from periodic reassessment and revamping of home tasks periodically (3-6 months). With visual aids provided by SLP, pt gestured to keywords, made comments x3 and asked SLP question x2. Husband reports conversations at home are mostly trying to figure out what pt needs. Assigned home task to have a conversation re: current events or pt interests every day, using whiteboard and written keywords for support.       Assessment / Recommendations / Plan   Plan  Continue with current plan of care      Progression Toward Goals   Progression toward goals  Progressing toward goals       SLP Education - 03/21/19 1315    Education Details  activities to practice conversation at home    Person(s) Educated  Patient;Spouse    Methods  Explanation;Handout    Comprehension  Verbalized understanding       SLP Short Term Goals - 03/21/19 1316      SLP SHORT TERM GOAL #1   Title  pt will demo ability for functionalality with simple household langauge tasks (e.g., grocery list, errand list, to-do list) over 3 sessions    Time  1    Period  Weeks    Status  Not Met  SLP SHORT TERM GOAL #2   Title  pt will achieve functional communication of wants/needs at home (reported, if needed) over two sessions    Time  1    Period  Weeks    Status  Not Met      SLP SHORT TERM GOAL #3   Title  pt will demo correct right/left comprehension in simple functional commands 9/10 opportunities    Time  1    Period  Weeks    Status  Not Met      SLP SHORT TERM GOAL #4   Title  pt will complete full aphasia eval before total visit #3    Baseline  form 1 completed 12/06/18    Status  Achieved      SLP SHORT TERM GOAL #5   Title  pt will complete cognitive linguistic eval PRN if deemed clinically necessary    Baseline  started 12/29/18    Time  1    Period  Weeks    Status  Partially Met       SLP Long Term Goals - 03/21/19 1316      SLP LONG TERM GOAL #1   Title  pt  will demonstrate improved verbal communication (as rated by pt) compared to when ST began    Period  Weeks    Status  Achieved      SLP LONG TERM GOAL #2   Title  pt will report speech and language is less frustrating than prior to initiation of ST    Time  1    Period  Weeks    Status  Achieved      SLP LONG TERM GOAL #3   Title  pt will produce functional verbal expression in 3 minutes simple conversation    Baseline  02/07/19 02/09/19 02/14/19    Time  4   renewed 11/24   Period  Weeks    Status  Achieved   omitted "over three sessions"     SLP LONG TERM GOAL #4   Title  pt will use multimodal communication PRN to express wants/needs per spouse report    Time  2   renewed 11/24   Period  Weeks    Status  Revised   added "per spouse report"     SLP LONG TERM GOAL #5   Title  pt will functionally write her first name independently in 7/10 attempts over 3 sessions    Time  2   renewed 11/24   Period  Weeks    Status  On-going      SLP LONG TERM GOAL #6   Title  Patient will initiate/maintain conversation by asking a question or making a comment at least twice during a 5 minute exchange, x3 visits.    Baseline  03/21/19    Time  2    Period  Weeks    Status  On-going       Plan - 03/21/19 1315    Clinical Impression Statement  Pt continues to present as an atypical TBI pt or CVA pt with mild-moderate (more moderate) anomic aphasia per Western Aphasia Battery-Revised; cognitive deficits including visuospatial skills, organization, slow processing ID'd per CLQT. See "skilled intervention" for details on today's session. Pt continues to make gains in simple conversation in therapy room as well as improved communication with family members outside of [place]. SLP recommends cont'd course of skilled ST to address pt's decr'd language-cogniton ability/possible verbal apraxia to incr. pt independence. Anticipate  d/c in next 2-4 sessions.    Speech Therapy Frequency  2x / week    Duration   --   8 weeks or 17 visits   Treatment/Interventions  SLP instruction and feedback;Compensatory strategies;Patient/family education;Multimodal communcation approach;Cueing hierarchy;Language facilitation;Functional tasks;Oral motor exercises;Internal/external aids;Environmental controls;Cognitive reorganization    Potential to Achieve Goals  Fair    Potential Considerations  Severity of impairments       Patient will benefit from skilled therapeutic intervention in order to improve the following deficits and impairments:   Aphasia  Cognitive communication deficit    Problem List Patient Active Problem List   Diagnosis Date Noted  . Fluency disorder associated with underlying disease 02/08/2018  . Cognitive and neurobehavioral dysfunction 02/08/2018  . 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 Arthritis of carpometacarpal [facility]) joint of left thumb 01/24/2018  . Ankle fracture, left 09/03/2016  . Hand dysfunction 03/11/2016  . Symptomatic varicose veins, bilateral 03/11/2016  . Numbness of left thumb 03/11/2016  . Elevated BP 07/27/2014  . Bunion of left foot 07/26/2014  . Abnormality of gait 07/26/2014  . Cough 11/10/2013  . Post-nasal drainage 11/10/2013  . Abnormal urine odor 11/10/2013  . Visit for preventive health examination 07/28/2013  . Right anterior knee pain 07/22/2012  . Medicare annual wellness visit, subsequent 07/25/2011  . ANXIETY, SITUATIONAL 09/25/2009  . ARTHRITIS 09/25/2009  . Vitamin D deficiency 09/27/2008  . Hyperlipidemia 09/17/2008  . Elevated blood pressure reading without diagnosis of hypertension 09/17/2008  . COLONIC POLYPS, HX OF 09/17/2008  . PERSONAL HISTORY DISEASES SKIN&SUBCUT TISSUE 09/17/2008   Deneise Lever, [place], CCC-SLP Speech-Language Pathologist   Aliene Altes 03/21/2019, 1:17 PM  [facility] [address] [place], [place], [address] Phone: [phone]   Fax:   [phone]   Name: Pamela Alvarez MRN: [id number] Date of Birth: 1940-03-11

## 2019-03-23 ENCOUNTER — Ambulatory Visit: Payer: Medicare PPO | Admitting: Speech Pathology

## 2019-03-23 ENCOUNTER — Other Ambulatory Visit: Payer: Self-pay

## 2019-03-23 DIAGNOSIS — R41841 Cognitive communication deficit: Secondary | ICD-10-CM | POA: Diagnosis not present

## 2019-03-23 DIAGNOSIS — R4701 Aphasia: Secondary | ICD-10-CM | POA: Diagnosis not present

## 2019-03-23 DIAGNOSIS — R2681 Unsteadiness on feet: Secondary | ICD-10-CM | POA: Diagnosis not present

## 2019-03-23 DIAGNOSIS — R2689 Other abnormalities of gait and mobility: Secondary | ICD-10-CM | POA: Diagnosis not present

## 2019-03-23 DIAGNOSIS — M6281 Muscle weakness (generalized): Secondary | ICD-10-CM | POA: Diagnosis not present

## 2019-03-23 NOTE — Therapy (Signed)
[facility] [address] [place], [place], [address] Phone: [phone]   Fax:  [phone]  Speech Language Pathology Treatment  Patient Details  Name: Pamela Alvarez MRN: [id number] Date of Birth: 03-16-1940 Referring Provider (SLP): Dohmeier, Asencion Partridge, MD   Encounter Date: 03/23/2019  End of Session - 03/23/19 1142    Visit Number  24    Number of Visits  33    Date for SLP Re-Evaluation  05/01/19    SLP Start Time  0932    SLP Stop Time   2505    SLP Time Calculation (min)  43 min    Activity Tolerance  Patient tolerated treatment well       Past Medical History:  Diagnosis Date  . Cyst 07-2008   Cyst on back x2 that were drained  . H/O echocardiogram 02-2000  . Hx of colonic polyps   . Hyperlipidemia   . Hypertension   . Varicose veins of both lower extremities     Past Surgical History:  Procedure Laterality Date  . POLYPECTOMY     Colon  . TONSILLECTOMY    . TUBAL LIGATION      There were no vitals filed for this visit.  Subjective Assessment - 03/23/19 1125    Subjective  "He has the, um..."    Patient is accompained by:  Family member    Currently in Pain?  No/denies            ADULT SLP TREATMENT - 03/23/19 1126      General Information   Behavior/Cognition  Alert;Cooperative;Pleasant mood      Treatment Provided   Treatment provided  Cognitive-Linquistic      Pain Assessment   Pain Assessment  No/denies pain      Cognitive-Linquistic Treatment   Treatment focused on  Aphasia;Patient/family/caregiver education    Skilled Treatment  Husband reports having conversations after the news, but has not been using whiteboard. SLP suggested husband practice with whiteboard in session today after SLP demonstration. SLP used whiteboard to offer pt choices re: topic of conversation. Pt selected "vaccine", first gesturing and then stating her preference. SLP read brief news article aloud and  wrote keywords on whiteboard for patient. Subsequently SLP engaged pt in conversation re: this topic. Pt used whiteboard for most of her responses, either verbalizing or gesturing to written keywords. Pt selected another topic (coronavirus) and SLP read another recent news article, with husband writing key words. SLP gave feedback and encouraged husband to include the main topics and "who, what, when, where, why." In conversation, husband asked mostly close-ended questions. SLP suggested and demonstrated some open-ended questions to allow pt to express her thoughts vs simply summarizing the article.       Assessment / Recommendations / Plan   Plan  Goals updated      Progression Toward Goals   Progression toward goals  Progressing toward goals       SLP Education - 03/23/19 1132    Education Details  how to select keywords, asking open ended questions    Person(s) Educated  Patient;Spouse       SLP Short Term Goals - 03/23/19 1143      SLP SHORT TERM GOAL #1   Title  pt will demo ability for functionalality with simple household langauge tasks (e.g., grocery list, errand list, to-do list) over 3 sessions    Time  1    Period  Weeks    Status  Not Met  SLP SHORT TERM GOAL #2   Title  pt will achieve functional communication of wants/needs at home (reported, if needed) over two sessions    Time  1    Period  Weeks    Status  Not Met      SLP SHORT TERM GOAL #3   Title  pt will demo correct right/left comprehension in simple functional commands 9/10 opportunities    Time  1    Period  Weeks    Status  Not Met      SLP SHORT TERM GOAL #4   Title  pt will complete full aphasia eval before total visit #3    Baseline  form 1 completed 12/06/18    Status  Achieved      SLP SHORT TERM GOAL #5   Title  pt will complete cognitive linguistic eval PRN if deemed clinically necessary    Baseline  started 12/29/18    Time  1    Period  Weeks    Status  Partially Met       SLP  Long Term Goals - 03/23/19 1143      SLP LONG TERM GOAL #1   Title  pt will demonstrate improved verbal communication (as rated by pt) compared to when ST began    Period  Weeks    Status  Achieved      SLP LONG TERM GOAL #2   Title  pt will report speech and language is less frustrating than prior to initiation of ST    Time  1    Period  Weeks    Status  Achieved      SLP LONG TERM GOAL #3   Title  pt will produce functional verbal expression in 3 minutes simple conversation    Baseline  02/07/19 02/09/19 02/14/19    Time  4   renewed 11/24   Period  Weeks    Status  Achieved   omitted "over three sessions"     SLP LONG TERM GOAL #4   Title  pt will use multimodal communication PRN to express wants/needs per spouse report    Time  2   renewed 11/24   Period  Weeks    Status  On-going   added "per spouse report"     SLP LONG TERM GOAL #5   Title  pt will functionally write her first name independently in 7/10 attempts over 3 sessions    Time  2   renewed 11/24   Period  Weeks    Status  Deferred   for focus on functional communication     Additional Long Term Goals   Additional Long Term Goals  Yes      SLP LONG TERM GOAL #6   Title  Patient will initiate/maintain conversation by asking a question or making a comment at least twice during a 5 minute exchange, x3 visits.    Baseline  03/21/19    Time  2    Period  Weeks    Status  On-going      SLP LONG TERM GOAL #7   Title  Spouse will demonstrate confidence in use of compensations/cuing strategies to use with pt to support expression x2 visits.    Time  2    Period  Weeks    Status  New       Plan - 03/23/19 1143    Clinical Impression Statement  Pt continues to present as an atypical TBI pt or  CVA pt with mild-moderate (more moderate) anomic aphasia per Western Aphasia Battery-Revised; cognitive deficits including visuospatial skills, organization, slow processing ID'd per CLQT. See "skilled intervention" for  details on today's session. Pt continues to make gains in simple conversation in therapy room as well as improved communication with family members outside of [place]. SLP recommends cont'd course of skilled ST to address pt's decr'd language-cogniton ability/possible verbal apraxia to incr. pt independence. Anticipate d/c in next 2-4 sessions.    Speech Therapy Frequency  2x / week    Duration  --   8 weeks or 17 visits   Treatment/Interventions  SLP instruction and feedback;Compensatory strategies;Patient/family education;Multimodal communcation approach;Cueing hierarchy;Language facilitation;Functional tasks;Oral motor exercises;Internal/external aids;Environmental controls;Cognitive reorganization    Potential to Achieve Goals  Fair    Potential Considerations  Severity of impairments    Consulted and Agree with Plan of Care  Patient       Patient will benefit from skilled therapeutic intervention in order to improve the following deficits and impairments:   Aphasia  Cognitive communication deficit    Problem List Patient Active Problem List   Diagnosis Date Noted  . Fluency disorder associated with underlying disease 02/08/2018  . Cognitive and neurobehavioral dysfunction 02/08/2018  . 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 Arthritis of carpometacarpal [facility]) joint of left thumb 01/24/2018  . Ankle fracture, left 09/03/2016  . Hand dysfunction 03/11/2016  . Symptomatic varicose veins, bilateral 03/11/2016  . Numbness of left thumb 03/11/2016  . Elevated BP 07/27/2014  . Bunion of left foot 07/26/2014  . Abnormality of gait 07/26/2014  . Cough 11/10/2013  . Post-nasal drainage 11/10/2013  . Abnormal urine odor 11/10/2013  . Visit for preventive health examination 07/28/2013  . Right anterior knee pain 07/22/2012  . Medicare annual wellness visit, subsequent 07/25/2011  . ANXIETY, SITUATIONAL 09/25/2009  . ARTHRITIS 09/25/2009  . Vitamin D deficiency 09/27/2008  .  Hyperlipidemia 09/17/2008  . Elevated blood pressure reading without diagnosis of hypertension 09/17/2008  . COLONIC POLYPS, HX OF 09/17/2008  . PERSONAL HISTORY DISEASES SKIN&SUBCUT TISSUE 09/17/2008   Deneise Lever, [place], CCC-SLP Speech-Language Pathologist'  Aliene Altes 03/23/2019, 11:46 AM  [facility] [address] [place], [place], [address] Phone: [phone]   Fax:  [phone]   Name: Pamela Alvarez MRN: [id number] Date of Birth: 1940/10/21

## 2019-03-23 NOTE — Patient Instructions (Signed)
Use the white board when you watch the news.  Write down a few of the news stories and let Caron choose which one she wants to talk about.  Then write down some key words (think who, what, where, when, why, how?). Let Daphyne use the whiteboard keywords to help her express opinions and thoughts. Try to ask some open-ended questions (for example, "What do you think about _____?")

## 2019-03-27 ENCOUNTER — Other Ambulatory Visit: Payer: Self-pay

## 2019-03-27 ENCOUNTER — Ambulatory Visit: Payer: Medicare PPO | Admitting: Neurology

## 2019-03-27 ENCOUNTER — Encounter: Payer: Self-pay | Admitting: Neurology

## 2019-03-27 VITALS — BP 157/84 | HR 99 | Temp 97.5°F | Ht 62.0 in | Wt 150.0 lb

## 2019-03-27 DIAGNOSIS — F0789 Other personality and behavioral disorders due to known physiological condition: Secondary | ICD-10-CM

## 2019-03-27 DIAGNOSIS — R269 Unspecified abnormalities of gait and mobility: Secondary | ICD-10-CM | POA: Diagnosis not present

## 2019-03-27 DIAGNOSIS — R41841 Cognitive communication deficit: Secondary | ICD-10-CM | POA: Diagnosis not present

## 2019-03-27 DIAGNOSIS — F09 Unspecified mental disorder due to known physiological condition: Secondary | ICD-10-CM | POA: Diagnosis not present

## 2019-03-27 DIAGNOSIS — G467 Other lacunar syndromes: Secondary | ICD-10-CM | POA: Diagnosis not present

## 2019-03-27 DIAGNOSIS — R4189 Other symptoms and signs involving cognitive functions and awareness: Secondary | ICD-10-CM

## 2019-03-27 MED ORDER — DONEPEZIL HCL 5 MG PO TABS: 5.0000 mg | ORAL_TABLET | Freq: Every day | ORAL | 0 refills | Status: DC

## 2019-03-27 NOTE — Progress Notes (Signed)
Provider:  Melvyn Novasarmen  Genavive Kubicki, MD    Referring Provider: Madelin HeadingsPanosh, [name], MD Primary Care Physician:  Madelin HeadingsPanosh, [name], MD  Chief Complaint  Patient presents with  . Follow-up    pt with husband, rm 10. pt since aug had completed therapy and it did help with speech and strengthening. since PT completed prior to christmas husband has seen some regression in ambulation.     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 female patient, and seen here in a Rv for memory loss- Mr. [name] reports that their children [place]. There [place] also [facility] that wanted to visit , but the Dills declined.   The profile today Mini-Mental Status Examination in which the patient scored 15 points.  She presented with a slightly elevated blood pressure today to the office.  Since August she had completed physical therapy and speech therapy which has helped with some strengthening.  And prior to Christmas she had completed her physical therapy.  But after physical therapy sees she has also began again to have some ambulation difficulties.  It seems that we need an ongoing may be group exercise for her and I wonder if there could be a Zoom or WebEx video that would help to encourage her to do some of these exercises in the home environment and with some guidance and perhaps even with correction of posture or other input.   She is doing Pilates on zoom, but its very difficult for hr to follow instructions.      Patient was seen here on 02-08-2018 in a referral from Dr. [name] for a "second opinion " in the work up for memory loss.  The patient reports having been stumped by the previously see neurologist in [place], who reportedly diagnosed her with Alzheimer's Dementia. 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 She has a [name] and reports she had some learning disability - not learning or memory related, but an inability to calculate.   Dr. Early Charsill  had always been acute aware of her learning abilities, but over the last couple of years there has been evidence of a declining cognitive ability.  The patient broke her ankle at the time and while she recovered from this supposedly only bony injury there were several changes, she felt always cold it was as if her enough thermostat had changed, she also noted and memory decline, may be a personality change, she had trouble retrieving words and her vocabulary became progressively restricted, her speech poor. She has ever since gait and balance impairment. She is in PT twice weekly.   Family history - mother had PD.father had CAD, died at 80 years old, cancer.  Social history- [name], no history of tobacco , ETOH- used to have one glass of wine with dinner, now 2-3 days a week.  No  recreatinonal drugs. 2 sons , aged 80 and 3955.    RV 04-12-2018,  Plan: Eye-movement abnormalities now most like associated with the gliosis and encephalomalcia and not neurodegeneratve. Vascular dementia.  Slowed EEG,  Abnormal gliosis or encephalomalacia in MRI - could this be an AVM? Fall related head trauma? Carotid vaso-vascular embolism?   Need MRA brain, non contrast.  Need carotid doppler first. PT, OT and ST to continue.  Rv in 2-3 month with me.   RV 10-27-2018, Rv  Due to the Coronavirus pandemia none of above tests were performed.  I explained again that the MRA is non contrast, no invasive.  New orders for ST, PT and OT will be needed. Her left hand is dominant and she has become very clumsy.   STUDY DATE: 10/29/2018 PATIENT NAME: Pamela Alvarez DOB: Jul 30, 1940 MRN: [id number]  EXAM: MR angiogram of the intracranial arteries  ORDERING CLINICIAN: Porfirio Mylar Josalynn Johndrow MD CLINICAL HISTORY: 79 year old woman with gait ataxia and acute cognitive changes COMPARISON FILMS: None  TECHNIQUE: MR angiogram of the head was obtained utilizing 3D time of flight sequences from below the vertebrobasilar junction up to the  intracranial vasculature without contrast.  Computerized reconstructions were obtained. CONTRAST: None IMAGING SITE: [facility], [address] [place], [place], [place]  FINDINGS: The imaged extracranial and intracranial portions of the internal carotid arteries appear normal. The middle cerebral and anterior cerebral arteries appear normal.   In the posterior circulation, the right vertebral artery is dominant.  The V4 segment of the left vertebral artery is not observed in the MIP reconstructions though some flow is noted on the source images.  Mild stenosis is noted within the proximal basilar artery.  There is a fetal origin of the left posterior cerebral artery.  Some luminal irregularity is noted in the P2 segment consistent with mild stenosis.  No aneurysms were identified.   IMPRESSION: This MR angiogram of the intracerebral arteries shows the following: 1.  Reduced flow is noted in the distal left vertebral artery.  This could be due to stenosis of the V4 segment or pre-cerebral stenosis more proximally..  If clinically indicated, consider a contrasted MR angiogram of the neck arteries to further evaluate. 2.  Mild stenosis of the proximal basilar artery.   3.  Mild stenosis of the left posterior cerebral artery which has a fetal origin (variant).     INTERPRETING PHYSICIAN:  Richard A. Epimenio Foot, MD, [name], FAAN Certified in  Neuroimaging by American Society of Neuroimaging   [facility]  NEUROIMAGING REPORT   STUDY DATE: 03/15/18 PATIENT NAME: Pamela Alvarez DOB: 03-11-1940 MRN: [id number]  ORDERING CLINICIAN: Melvyn Novas, MD  CLINICAL HISTORY: 79 year old female with memory loss and confusion.  EXAM: MRI brain (without)  TECHNIQUE: MRI of the brain without contrast was obtained utilizing 5 mm axial slices with T1, T2, T2 flair, SWI and diffusion weighted views.  T1 sagittal and T2 coronal views were obtained. CONTRAST: no (patient declined  IV contrast) COMPARISON: none  IMAGING SITE: [facility] [address] (1.5 Tesla MRI)    FINDINGS:   Abnormal T2 FLAIR hyperintense signal in the right frontal, right parietal and right cerebellar regions, with encephalomalacia and gliosis.  However there also appears to be underlying heterogeneous lesions with surrounding vasogenic edema.  On SWI views, there is increased susceptibility within the rim of these regions as well indicating mineralization or hemosiderin deposition.  Considerations would include embolic chronic ischemic infarcts, although underlying neoplasm (metastases) cannot be excluded.   Elsewhere few punctate foci of nonspecific gliosis in the subcortical white matter.  No abnormal lesions are seen on diffusion-weighted views to suggest acute ischemia. The cortical sulci, fissures and cisterns are normal in size and appearance. Lateral, third and fourth ventricle are normal in size and appearance. No extra-axial fluid collections are seen. No evidence of mass effect or midline shift.    On sagittal views the posterior fossa, pituitary gland and corpus callosum are unremarkable. No evidence of intracranial hemorrhage on SWI views. The orbits and their contents, paranasal sinuses and calvarium are unremarkable.  Intracranial flow voids are present.   IMPRESSION:  Abnormal MRI brain (without) demonstrating: - Right frontal, right parietal and right cerebellar encephalomalacia and gliosis.  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  Underlying neoplastic or vascular lesions cannot be excluded, and would recommend follow-up imaging with postcontrast views. - Mild chronic small vessel ischemic disease. - No acute findings.   INTERPRETING PHYSICIAN:  Suanne Marker, MD Certified in Neurology, Neurophysiology and Neuroimaging    Review of Systems: Out of a complete 14 system review, the patient complains of only the following  symptoms, and all other reviewed systems are negative.  Patient had several falls, once hit the back of the head,  Had a bid goose egg- ED did not image her.  now right encephalomalcia may be related- her cognitive , coordination and speech declined after the fall.. .  Poverty of speech in a patient with a [name] degree , stiff gait, left-right confusion acalculia.  Left thumb sensoryloss, clumsy hand.  No Dysphagia. Mild Dysphonia. No loss of smell or taste.     Social History   Socioeconomic History  . Marital status: Married    Spouse name: [name]  . Number of children: 2  . Years of education: Not on file  . Highest education level: Doctorate  Occupational History  . Occupation: retired    Comment: Social research officer, government [name]  Tobacco Use  . Smoking status: Never Smoker  . Smokeless tobacco: Never Used  Substance and Sexual Activity  . Alcohol use: Yes    Alcohol/week: 7.0 standard drinks    Types: 7 Glasses of wine per week    Comment: one a day  . Drug use: Not Currently  . Sexual activity: Not on file  Other Topics Concern  . Not on file  Social History Narrative   Regular exercise-yes   Moved back to [place] from [place] of 2 cat   G3 P2    Exercises walking regu;arly    Retired  Social research officer, government   Drinks one cup of coffee  A day. In addition to walking QD she and her husband take a pilates class on Thursdays. She lives with her husband in a 2 story house, though the master bedroom in on the first level.       Social Determinants of Health   Financial Resource Strain:   . Difficulty of Paying Living Expenses: Not on file  Food Insecurity:   . Worried About Programme researcher, broadcasting/film/video in the Last Year: Not on file  . Ran Out of Food in the Last Year: Not on file  Transportation Needs:   . Lack of Transportation (Medical): Not on file  . Lack of Transportation (Non-Medical): Not on file  Physical Activity:   . Days of Exercise per Week: Not on file  . Minutes of  Exercise per Session: Not on file  Stress:   . Feeling of Stress : Not on file  Social Connections:   . Frequency of Communication with Friends and Family: Not on file  . Frequency of Social Gatherings with Friends and Family: Not on file  . Attends Religious Services: Not on file  . Active Member of Clubs or Organizations: Not on file  . Attends Banker Meetings: Not on file  . Marital Status: Not on file  Intimate Partner Violence:   . Fear of Current or Ex-Partner: Not on file  . Emotionally Abused: Not on file  . Physically Abused: Not on file  .  Sexually Abused: Not on file    Family History  Problem Relation Age of Onset  . Hypertension Mother   . Parkinsonism Mother   . Angina Father   . Heart disease Father   . Colon cancer Father   . Lung cancer Father   . Skin cancer Father   . Cancer Father        Heart  . Breast cancer Sister        over 49  . Diabetes Sister   . Heart disease Brother 27       triple bypass surgery  . Macular degeneration Sister   . Diabetes Sister        prediabetic  . Hearing loss Sister        hearing problems  . Vasculitis Sister     Past Medical History:  Diagnosis Date  . Cyst 07-2008   Cyst on back x2 that were drained  . H/O echocardiogram 02-2000  . Hx of colonic polyps   . Hyperlipidemia   . Hypertension   . Varicose veins of both lower extremities     Past Surgical History:  Procedure Laterality Date  . POLYPECTOMY     Colon  . TONSILLECTOMY    . TUBAL LIGATION      Current Outpatient Medications  Medication Sig Dispense Refill  . atorvastatin (LIPITOR) 10 MG tablet Take 10 mg by mouth daily.    . cyanocobalamin 1000 MCG tablet Take 1,000 mcg by mouth 3 (three) times a week.     . losartan (COZAAR) 25 MG tablet Take 1 tablet (25 mg total) by mouth daily. 90 tablet 2  . VITAMIN D, CHOLECALCIFEROL, PO Take 1 tablet by mouth daily.     No current facility-administered medications for this visit.     Allergies as of 03/27/2019 - Review Complete 03/27/2019  Allergen Reaction Noted  . Dust mite extract Cough 12/09/2015  . Sulfonamide derivatives      Vitals: BP (!) 157/84   Pulse 99   Temp (!) 97.5 F (36.4 C)   Ht 5\' 2"  (1.575 m)   Wt 150 lb (68 kg)   BMI 27.44 kg/m  Last Weight:  Wt Readings from Last 1 Encounters:  03/27/19 150 lb (68 kg)   Last Height:   Ht Readings from Last 1 Encounters:  03/27/19 5\' 2"  (1.575 m)    Physical exam:  General: The patient is awake, alert and appears not in acute distress. The patient is well groomed. Head: Normocephalic, atraumatic. Neck is supple. Cardiovascular:  Regular rate and rhythm , without  murmurs or carotid bruit, and without distended neck veins. Respiratory: Lungs are clear to auscultation. Skin:  Without evidence of edema, or rash Trunk: BMI is 27.1 kg/m2  elevated and patient  has normal posture.  Neurologic exam : The patient is awake and alert, oriented to place and time.  Memory subjective described as impaired  There is an ab- normal attention span & concentration ability. Speech is non -fluent with aphasia. Mood and affect are meek.  Needs memory testing.  MMSE - Mini Mental State Exam 03/27/2019 02/08/2018  Orientation to time 2 1  Orientation to Place 2 5  Registration 3 3  Attention/ Calculation 0 0  Recall 2 1  Language- name 2 objects 2 2  Language- repeat 1 0  Language- follow 3 step command 3 3  Language- read & follow direction 0 1  Write a sentence 0 0  Copy design 0 0  Total score 15 16    Cranial nerves: Pupils are equal and briskly reactive to light.  Funduscopic exam without  evidence of pallor or edema. Extraocular movements  in vertical planes restricted, neither able to pursuit upwards not downwards gaze-  and horizontal planes with sharp, coarse saccades, skipping the central vision without nystagmus.  Reduced, rare blink -   Confirmed twice that she cannot follow with downward gaze,  but she wa able -delayed- to lok up.  Visual fields by finger perimetry are restricted  Hearing to finger rub intact.   Facial sensation intact to fine touch. Facial motor strength is symmetric, but reduced facial mimic.  Tongue and uvula move midline. Tongue protrusion into either cheek is normal. Shoulder shrug is normal.   Motor exam: elevated tone, symmetric muscle bulk and symmetric strength in all extremities.  Sensory:  Fine touch, pinprick and vibration were tested in all extremities. She seems to have higher sensitivity on the left, she did not show extinction.  Proprioception was normal.  Coordination: Rapid alternating movements in the fingers/hands were normal. Finger-to-nose maneuver  normal without evidence of ataxia, dysmetria or tremor.  Gait and station: Patient walks without assistive device and is able unassisted to climb up to the exam table.  Strength within normal limits, but gait appears stiff, no rotation - Stance is stable and normal. Tandem gait was deferred.  Romberg testing is negative   Deep tendon reflexes: in the upper and lower extremities are symmetric and intact.  Babinski maneuver response is  downgoing.  Assessment:  After physical and neurologic examination, review of laboratory studies, imaging, neurophysiology testing and pre-existing records, assessment is that of :   Neurodegenerative disease process - as we ruled out vascular injuries.  - Gliosis confirms the trauma is most likely the cause- see MRI brain.    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   Left thumb clumsiness.      Marland Kitchen    MMSE - Mini Mental State Exam 03/27/2019 02/08/2018  Orientation to time 2 1  Orientation to Place 2 5  Registration 3 3  Attention/ Calculation 0 0  Recall 2 1  Language- name 2 objects 2 2  Language- repeat 1 0  Language-  follow 3 step command 3 3  Language- read & follow direction 0 1  Write a sentence 0 0  Copy design 0 0  Total score 15 16     Plan:  Treatment plan and additional workup :  Due to the Coronavirus pandemia none of above tests were performed.  I explained again that the MRA is non contrast, no invasive.  renewal orders for ST, PT and OT will be needed.  Her left hand is dominant and she has become very clumsy on the left, but also feels a stronger sensitivity to fine touch and pin prick.  She won't take Aricept- she is very sceptical,but I like for her to try- 5 mg daily in AM- she will take the script home- I am not optimistic that she will take it.     Rv in 6-8 month with MMSE- by NP, but I will be available for consultation.  Porfirio Mylar Teosha Casso MD 03/27/2019

## 2019-03-28 ENCOUNTER — Ambulatory Visit: Payer: Medicare PPO | Admitting: Speech Pathology

## 2019-03-28 ENCOUNTER — Other Ambulatory Visit: Payer: Self-pay

## 2019-03-28 DIAGNOSIS — R41841 Cognitive communication deficit: Secondary | ICD-10-CM | POA: Diagnosis not present

## 2019-03-28 DIAGNOSIS — R2689 Other abnormalities of gait and mobility: Secondary | ICD-10-CM | POA: Diagnosis not present

## 2019-03-28 DIAGNOSIS — M6281 Muscle weakness (generalized): Secondary | ICD-10-CM | POA: Diagnosis not present

## 2019-03-28 DIAGNOSIS — R4701 Aphasia: Secondary | ICD-10-CM | POA: Diagnosis not present

## 2019-03-28 DIAGNOSIS — R2681 Unsteadiness on feet: Secondary | ICD-10-CM | POA: Diagnosis not present

## 2019-03-28 NOTE — Therapy (Signed)
[facility] [address] [place], [place], [address] Phone: [phone]   Fax:  [phone]  Speech Language Pathology Treatment  Patient Details  Name: Pamela Alvarez MRN: [id number] Date of Birth: 1940-05-12 Referring Provider (SLP): Dohmeier, Asencion Partridge, MD   Encounter Date: 03/28/2019  End of Session - 03/28/19 1655    Visit Number  25    Number of Visits  33    Date for SLP Re-Evaluation  05/01/19    SLP Start Time  0935    SLP Stop Time   1015    SLP Time Calculation (min)  40 min    Activity Tolerance  Patient tolerated treatment well       Past Medical History:  Diagnosis Date  . Cyst 07-2008   Cyst on back x2 that were drained  . H/O echocardiogram 02-2000  . Hx of colonic polyps   . Hyperlipidemia   . Hypertension   . Varicose veins of both lower extremities     Past Surgical History:  Procedure Laterality Date  . POLYPECTOMY     Colon  . TONSILLECTOMY    . TUBAL LIGATION      There were no vitals filed for this visit.  Subjective Assessment - 03/28/19 0937    Subjective  "We've been to the doctor."    Patient is accompained by:  Family member    Currently in Pain?  No/denies            ADULT SLP TREATMENT - 03/28/19 1654      General Information   Behavior/Cognition  Alert;Cooperative;Pleasant mood      Treatment Provided   Treatment provided  Cognitive-Linquistic      Pain Assessment   Pain Assessment  No/denies pain      Cognitive-Linquistic Treatment   Treatment focused on  Aphasia;Patient/family/caregiver education    Skilled Treatment  Patient initiated conversation with SLP when entering treatment room (see "S" statement). SLP used visual supports (written keywords) with pt when discussing details of medical appointment. SLP educated re: progress in [place] and that pt goals likely met/ ready for d/c next session. Pt/husband in agreement. Discussed plan for follow-up; pt/husband to  request referral from Dr. Brett Fairy in 6 months if functional decline in communication. Patient and husband are comfortable with activities for home practice; pt is doing activities daily on [place] per husband report. Husband reports using multimodal communication/ written keywords with pt to resolve a communication breakdown at home. SLP encouraged husband to continue practice with whiteboard and keywords with shared context (after watching news together, or other topic with shared knowledge) so that it is easy to use strategies when breakdowns occur. Pt initiated second conversation with SLP about wanting to be more independent at home. SLP used whiteboard to list items pt would like to "do more" at home. Pt reported wanting to ambulate without supervison using rollator; SLP affirmed this desire but encouraged pt to continue to allow husband to supervise as recommended per PT due to fall risk. SLP and pt generated list of ways patient could be more involved in kitchen activities.      Assessment / Recommendations / Plan   Plan  Continue with current plan of care      Progression Toward Goals   Progression toward goals  Progressing toward goals       SLP Education - 03/28/19 1654    Education Details  practice with multimodal supports regularly, so it is easier to use  them when breakdowns occur    Person(s) Educated  Patient;Spouse    Methods  Explanation;Demonstration    Comprehension  Verbalized understanding;Need further instruction       SLP Short Term Goals - 03/28/19 [place] #1   Title  pt will demo ability for functionalality with simple household langauge tasks (e.g., grocery list, errand list, to-do list) over 3 sessions    Time  1    Period  Weeks    Status  Not Met      SLP SHORT TERM GOAL #2   Title  pt will achieve functional communication of wants/needs at home (reported, if needed) over two sessions    Time  1    Period  Weeks    Status   Not Met      SLP SHORT TERM GOAL #3   Title  pt will demo correct right/left comprehension in simple functional commands 9/10 opportunities    Time  1    Period  Weeks    Status  Not Met      SLP SHORT TERM GOAL #4   Title  pt will complete full aphasia eval before total visit #3    Baseline  form 1 completed 12/06/18    Status  Achieved      SLP SHORT TERM GOAL #5   Title  pt will complete cognitive linguistic eval PRN if deemed clinically necessary    Baseline  started 12/29/18    Time  1    Period  Weeks    Status  Partially Met       SLP Long Term Goals - 03/28/19 1655      SLP LONG TERM GOAL #1   Title  pt will demonstrate improved verbal communication (as rated by pt) compared to when ST began    Period  Weeks    Status  Achieved      SLP LONG TERM GOAL #2   Title  pt will report speech and language is less frustrating than prior to initiation of ST    Time  1    Period  Weeks    Status  Achieved      SLP LONG TERM GOAL #3   Title  pt will produce functional verbal expression in 3 minutes simple conversation    Baseline  02/07/19 02/09/19 02/14/19    Time  4   renewed 11/24   Period  Weeks    Status  Achieved   omitted "over three sessions"     SLP LONG TERM GOAL #4   Title  pt will use multimodal communication PRN to express wants/needs per spouse report    Baseline  03/28/19 (with assistance from spouse)    Time  1   renewed 11/24   Period  Weeks    Status  On-going   added "per spouse report"     SLP LONG TERM GOAL #5   Title  pt will functionally write her first name independently in 7/10 attempts over 3 sessions    Time  2   renewed 11/24   Period  Weeks    Status  Deferred   for focus on functional communication     SLP LONG TERM GOAL #6   Title  Patient will initiate/maintain conversation by asking a question or making a comment at least twice during a 5 minute exchange, x3 visits.    Baseline  03/21/19 03/28/19  Time  1    Period  Weeks     Status  On-going      SLP LONG TERM GOAL #7   Title  Spouse will demonstrate confidence in use of compensations/cuing strategies to use with pt to support expression x2 visits.    Time  2    Period  Weeks    Status  New       Plan - 03/28/19 1657    Clinical Impression Statement  Non-fluent aphasia and cognitive deficits including decreased visuospatial skills, organization, attention, recall and slow processing persist. Pt saw Dr. Brett Fairy yesterday who noted "neurodegenerative disease process - as we ruled out vascular injuries.  - Gliosis confirms the trauma is most likely the cause." Spouse and patient report using multimodal communication at home to assist when breakdowns occur. Spouse reports improving confidence in assisting pt with communication at home. With visual supports and extended time, pt participates in simple conversations of 5-8 minutes. Continue skilled ST for language-cognition abilities and caregiver training in strategies to assist pt with communication. Anticipate remaining LTGs met next session; pt and husband in agreement with d/c at that time, with plan to follow up with Dr. Brett Fairy for ST referral in 3-6 months if decline in communication abilities noted.    Speech Therapy Frequency  2x / week    Duration  --   8 weeks or 17 visits   Treatment/Interventions  SLP instruction and feedback;Compensatory strategies;Patient/family education;Multimodal communcation approach;Cueing hierarchy;Language facilitation;Functional tasks;Oral motor exercises;Internal/external aids;Environmental controls;Cognitive reorganization    Potential to Achieve Goals  Fair    Potential Considerations  Severity of impairments    Consulted and Agree with Plan of Care  Patient       Patient will benefit from skilled therapeutic intervention in order to improve the following deficits and impairments:   Aphasia  Cognitive communication deficit    Problem List Patient Active Problem List    Diagnosis Date Noted  . Fluency disorder associated with underlying disease 02/08/2018  . Cognitive and neurobehavioral dysfunction 02/08/2018  . 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 Arthritis of carpometacarpal [facility]) joint of left thumb 01/24/2018  . Ankle fracture, left 09/03/2016  . Hand dysfunction 03/11/2016  . Symptomatic varicose veins, bilateral 03/11/2016  . Numbness of left thumb 03/11/2016  . Elevated BP 07/27/2014  . Bunion of left foot 07/26/2014  . Abnormality of gait 07/26/2014  . Cough 11/10/2013  . Post-nasal drainage 11/10/2013  . Abnormal urine odor 11/10/2013  . Visit for preventive health examination 07/28/2013  . Right anterior knee pain 07/22/2012  . Medicare annual wellness visit, subsequent 07/25/2011  . ANXIETY, SITUATIONAL 09/25/2009  . ARTHRITIS 09/25/2009  . Vitamin D deficiency 09/27/2008  . Hyperlipidemia 09/17/2008  . Elevated blood pressure reading without diagnosis of hypertension 09/17/2008  . COLONIC POLYPS, HX OF 09/17/2008  . PERSONAL HISTORY DISEASES SKIN&SUBCUT TISSUE 09/17/2008   Deneise Lever, [place], CCC-SLP Speech-Language Pathologist  Aliene Altes 03/28/2019, [facility] [address] [place], [place], [address] Phone: [phone]   Fax:  [phone]   Name: Pamela Alvarez MRN: [id number] Date of Birth: 01-17-1941

## 2019-04-04 ENCOUNTER — Encounter: Payer: Medicare Other | Admitting: Speech Pathology

## 2019-04-04 ENCOUNTER — Ambulatory Visit: Payer: Medicare PPO

## 2019-04-06 ENCOUNTER — Other Ambulatory Visit: Payer: Self-pay

## 2019-04-06 ENCOUNTER — Ambulatory Visit: Payer: Medicare PPO | Admitting: Speech Pathology

## 2019-04-06 ENCOUNTER — Encounter: Payer: Medicare Other | Admitting: Speech Pathology

## 2019-04-06 ENCOUNTER — Other Ambulatory Visit: Payer: Self-pay | Admitting: Internal Medicine

## 2019-04-06 DIAGNOSIS — R4701 Aphasia: Secondary | ICD-10-CM | POA: Diagnosis not present

## 2019-04-06 DIAGNOSIS — R2689 Other abnormalities of gait and mobility: Secondary | ICD-10-CM | POA: Diagnosis not present

## 2019-04-06 DIAGNOSIS — R41841 Cognitive communication deficit: Secondary | ICD-10-CM

## 2019-04-06 DIAGNOSIS — M6281 Muscle weakness (generalized): Secondary | ICD-10-CM | POA: Diagnosis not present

## 2019-04-06 DIAGNOSIS — R2681 Unsteadiness on feet: Secondary | ICD-10-CM | POA: Diagnosis not present

## 2019-04-07 ENCOUNTER — Ambulatory Visit: Payer: Medicare PPO | Admitting: Physical Therapy

## 2019-04-07 ENCOUNTER — Other Ambulatory Visit: Payer: Self-pay

## 2019-04-07 DIAGNOSIS — R2689 Other abnormalities of gait and mobility: Secondary | ICD-10-CM | POA: Diagnosis not present

## 2019-04-07 DIAGNOSIS — R4701 Aphasia: Secondary | ICD-10-CM | POA: Diagnosis not present

## 2019-04-07 DIAGNOSIS — R2681 Unsteadiness on feet: Secondary | ICD-10-CM | POA: Diagnosis not present

## 2019-04-07 DIAGNOSIS — R41841 Cognitive communication deficit: Secondary | ICD-10-CM | POA: Diagnosis not present

## 2019-04-07 DIAGNOSIS — M6281 Muscle weakness (generalized): Secondary | ICD-10-CM

## 2019-04-07 NOTE — Therapy (Signed)
[facility] [address] [place], [place], [address] Phone: [phone]   Fax:  [phone]  Speech Language Pathology Treatment and Discharge Summary  Patient Details  Name: Pamela Alvarez MRN: [id number] Date of Birth: 01/21/1941 Referring Provider (SLP): Dohmeier, Asencion Partridge, MD   Encounter Date: 04/06/2019  End of Session - 04/07/19 0812    Visit Number  26    Number of Visits  33    Date for SLP Re-Evaluation  05/01/19    SLP Start Time  74    SLP Stop Time   1100    SLP Time Calculation (min)  42 min    Activity Tolerance  Patient tolerated treatment well       Past Medical History:  Diagnosis Date  . Cyst 07-2008   Cyst on back x2 that were drained  . H/O echocardiogram 02-2000  . Hx of colonic polyps   . Hyperlipidemia   . Hypertension   . Varicose veins of both lower extremities     Past Surgical History:  Procedure Laterality Date  . POLYPECTOMY     Colon  . TONSILLECTOMY    . TUBAL LIGATION      There were no vitals filed for this visit.  Subjective Assessment - 04/06/19 1019    Subjective  "I'm doing okay."    Patient is accompained by:  Family member    Currently in Pain?  No/denies            ADULT SLP TREATMENT - 04/07/19 0806      General Information   Behavior/Cognition  Alert;Cooperative;Pleasant mood      Treatment Provided   Treatment provided  Cognitive-Linquistic      Pain Assessment   Pain Assessment  No/denies pain      Cognitive-Linquistic Treatment   Treatment focused on  Aphasia;Patient/family/caregiver education    Skilled Treatment  Spouse reports using compensations/strategies at home with patient: "Whenever I don't understand, I get out the whiteboard." Spouse also verbalized recommended activities to continue engaging pt's language-cognition at home. Pt using gesture in session (and at home, per report), pointing to spouse alert him when she wanted to make a  comment. SLP praised pt/spouse for their efforts in learning new ways to support pt's communication. In conversation today (pt, pt's spouse, and therapist), patient initiated several comments and asked question x2. Pt/spouse in agreement with d/c at this time as goals met.       Assessment / Recommendations / Plan   Plan  Discharge SLP treatment due to (comment)   active goals met     Progression Toward Goals   Progression toward goals  Goals met, education completed, patient discharged from [facility] - 04/07/19 [phone]    Education Details  continue using compensations at home, language-cognition activities daily    Person(s) Educated  Patient;Spouse    Methods  Explanation;Demonstration    Comprehension  Verbalized understanding       SLP Short Term Goals - 04/07/19 0805      SLP SHORT TERM GOAL #1   Title  pt will demo ability for functionalality with simple household langauge tasks (e.g., grocery list, errand list, to-do list) over 3 sessions    Time  1    Period  Weeks    Status  Not Met      SLP SHORT TERM GOAL #2   Title  pt will achieve functional communication of wants/needs at home (  reported, if needed) over two sessions    Time  1    Period  Weeks    Status  Not Met      SLP SHORT TERM GOAL #3   Title  pt will demo correct right/left comprehension in simple functional commands 9/10 opportunities    Time  1    Period  Weeks    Status  Not Met      SLP SHORT TERM GOAL #4   Title  pt will complete full aphasia eval before total visit #3    Baseline  form 1 completed 12/06/18    Status  Achieved      SLP SHORT TERM GOAL #5   Title  pt will complete cognitive linguistic eval PRN if deemed clinically necessary    Baseline  started 12/29/18    Time  1    Period  Weeks    Status  Partially Met       SLP Long Term Goals - 04/07/19 0804      SLP LONG TERM GOAL #1   Title  pt will demonstrate improved verbal communication (as rated by pt) compared to  when ST began    [place] #2   Title  pt will report speech and language is less frustrating than prior to initiation of ST    Time  1    Period  Weeks    Status  Achieved      SLP LONG TERM GOAL #3   Title  pt will produce functional verbal expression in 3 minutes simple conversation    Baseline  02/07/19 02/09/19 02/14/19    Time  4   renewed 11/24   Period  Weeks    Status  Achieved   omitted "over three sessions"     SLP LONG TERM GOAL #4   Title  pt will use multimodal communication PRN to express wants/needs per spouse report    Baseline  03/28/19 (with assistance from spouse)    Time  1   renewed 11/24   Period  Weeks    Status  Achieved   added "per spouse report"     SLP LONG TERM GOAL #5   Title  pt will functionally write her first name independently in 7/10 attempts over 3 sessions    Time  2   renewed 11/24   Period  Weeks    Status  Deferred   for focus on functional communication     SLP LONG TERM GOAL #6   Title  Patient will initiate/maintain conversation by asking a question or making a comment at least twice during a 5 minute exchange, x3 visits.    Baseline  03/21/19 03/28/19 04/06/19    Time  1    Period  Weeks    Status  Achieved      SLP LONG TERM GOAL #7   Title  Spouse will demonstrate confidence in use of compensations/cuing strategies to use with pt to support expression x2 visits.    Baseline  03/28/19 04/06/19    Time  2    Period  Weeks    Status  Achieved       Plan - 04/07/19 0813    Clinical Impression Statement  Non-fluent aphasia and cognitive deficits including decreased visuospatial skills, organization, attention, recall and slow processing persist. Dr. Brett Fairy has noted "neurodegenerative disease process - as we  ruled out vascular injuries.  - Gliosis confirms the trauma is most likely the cause." Spouse and patient report using multimodal communication at home to assist when  breakdowns occur. Spouse demonstrates confidence in assisting pt with communication. With visual supports and extended time, pt participates in simple conversations of 5-8 minutes. Patient and spouse in agreement with d/c as LTGs met;  plan to follow up with Dr. Brett Fairy and will request ST referral in ~6 months if decline in communication abilities noted.    Speech Therapy Frequency  --   d/c   Duration  --   d/c   Treatment/Interventions  SLP instruction and feedback;Compensatory strategies;Patient/family education;Multimodal communcation approach;Cueing hierarchy;Language facilitation;Functional tasks;Oral motor exercises;Internal/external aids;Environmental controls;Cognitive reorganization    Potential to Achieve Goals  Fair    Potential Considerations  Severity of impairments    Consulted and Agree with Plan of Care  Patient       Patient will benefit from skilled therapeutic intervention in order to improve the following deficits and impairments:   Aphasia  Cognitive communication deficit    Problem List Patient Active Problem List   Diagnosis Date Noted  . Fluency disorder associated with underlying disease 02/08/2018  . Cognitive and neurobehavioral dysfunction 02/08/2018  . 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 Arthritis of carpometacarpal [facility]) joint of left thumb 01/24/2018  . Ankle fracture, left 09/03/2016  . Hand dysfunction 03/11/2016  . Symptomatic varicose veins, bilateral 03/11/2016  . Numbness of left thumb 03/11/2016  . Elevated BP 07/27/2014  . Bunion of left foot 07/26/2014  . Abnormality of gait 07/26/2014  . Cough 11/10/2013  . Post-nasal drainage 11/10/2013  . Abnormal urine odor 11/10/2013  . Visit for preventive health examination 07/28/2013  . Right anterior knee pain 07/22/2012  . Medicare annual wellness visit, subsequent 07/25/2011  . ANXIETY, SITUATIONAL 09/25/2009  . ARTHRITIS 09/25/2009  . Vitamin D deficiency 09/27/2008  . Hyperlipidemia  09/17/2008  . Elevated blood pressure reading without diagnosis of hypertension 09/17/2008  . COLONIC POLYPS, HX OF 09/17/2008  . PERSONAL HISTORY DISEASES SKIN&SUBCUT TISSUE 09/17/2008    SPEECH THERAPY DISCHARGE SUMMARY  Visits from Start of Care: 26  Current functional level related to goals / functional outcomes: See goals/ clinical impressions above. Patient participating in 5-8 minute simple conversations with communication partner support; initiating comment or questions at least 2x during conversation.    Remaining deficits: Non-fluent aphasia and mod-severe cognitive communication deficits     Education / Equipment: Partner-supported communication, activities for language, cognition Plan: Patient agrees to discharge.  Patient goals were met. Patient is being discharged due to meeting the stated rehab goals.  ?????         Deneise Lever, [place], CCC-SLP Speech-Language Pathologist  Aliene Altes 04/07/2019, 8:16 AM  [facility] [address] [place], [place], [address] Phone: [phone]   Fax:  [phone]   Name: Pamela Alvarez MRN: [id number] Date of Birth: Aug 12, 1940

## 2019-04-07 NOTE — Therapy (Signed)
[facility] [address] [place], [place], [address] Phone: [phone]   Fax:  [phone]  Physical Therapy Treatment  Patient Details  Name: Pamela Alvarez MRN: [id number] Date of Birth: [birth year] Referring Provider (PT): Dohmeier, Asencion Partridge   Encounter Date: 04/07/2019  PT End of Session - 04/07/19 1158    Visit Number  22    Number of Visits  39   per recert 9/74/1638   Date for PT Re-Evaluation  07/06/19    Authorization Type  [employer]-will need PROGRESS NOTE at Visit 30-32    PT Start Time  1105    PT Stop Time  1149    PT Time Calculation (min)  44 min    Equipment Utilized During Treatment  Gait belt    Activity Tolerance  Patient tolerated treatment well    Behavior During Therapy  [facility] for tasks assessed/performed   Decreased safety awareness with transfers      Past Medical History:  Diagnosis Date  . Cyst 07-2008   Cyst on back x2 that were drained  . H/O echocardiogram 02-2000  . Hx of colonic polyps   . Hyperlipidemia   . Hypertension   . Varicose veins of both lower extremities     Past Surgical History:  Procedure Laterality Date  . POLYPECTOMY     Colon  . TONSILLECTOMY    . TUBAL LIGATION      There were no vitals filed for this visit.  Subjective Assessment - 04/07/19 1107    Subjective  Patient's husband reports that he feels she has regressed. Not picking up her feet and has had several falls since last visit. Husband reports she tends to fall back or sideways, and has fallen multiple times "right in front of him". Denies any injuries from falls, but requires assistance to get up once fallen. Patient's husband reports she is practicing using the rollator in the house, but she will not use it for community ambulation. Denies HEP compliance since last visit.    Patient is accompained by:  Family member    Patient Stated Goals  be more mobile outside the house; better walking     Currently in Pain?  No/denies         [facility] PT Assessment - 04/07/19 1111      Assessment   Medical Diagnosis  Acute cognitive decline, clumsiness of LUE, Gait abnormality, recurrent falls    Referring Provider (PT)  Dohmeier, Asencion Partridge    Onset Date/Surgical Date  10/27/18   at least 2 falls since December 2020     Strength   Overall Strength  Deficits    Overall Strength Comments  Challenge to follow instructions for testing; grossly at least 3/5 for B hip, knee and ankle      Ambulation/Gait   Ambulation/Gait  Yes    Ambulation/Gait Assistance  5: Supervision;4: Min guard;4: Min assist    Ambulation Distance (Feet)  115 Feet    Assistive device  None    Gait Pattern  Step-through pattern;Decreased arm swing - right;Decreased arm swing - left;Decreased step length - right;Decreased step length - left;Shuffle;Decreased dorsiflexion - left;Decreased dorsiflexion - right    Ambulation Surface  Level;Indoor    Gait velocity  27.4 seconds (1.20 ft/sec)       Standardized Balance Assessment   Standardized Balance Assessment  Timed Up and Go Test;Berg Balance Test      Timed Up and Go Test   TUG  Normal TUG  Normal TUG (seconds)  32.57    TUG Comments  Scores >13.5 sec indicate increased fall risk.                   [facility] PT Treatment/Exercise - 04/07/19 1111      Transfers   Transfers  Sit to Stand;Stand to Sit    Sit to Stand  With upper extremity assist;From bed;4: Min guard;4: Min assist;From chair/3-in-1    Sit to Stand Details  Verbal cues for technique;Verbal cues for sequencing    Stand to Sit  4: Min guard;4: Min assist;Uncontrolled descent;To bed;To chair/3-in-1    Stand to Sit Details (indicate cue type and reason)  Verbal cues for sequencing;Verbal cues for technique    Comments  With turning to sit, pt tends to turn too soon, needs assistance and cues to fully turn to sit.      Ambulation/Gait   Gait Comments  Frequently and easily distracted  during gait; frequent veering to L      Berg Balance Test   Sit to Stand  Needs minimal aid to stand or to stabilize    Standing Unsupported  Able to stand 2 minutes with supervision    Sitting with Back Unsupported but Feet Supported on Floor or Stool  Able to sit safely and securely 2 minutes    Stand to Sit  Needs assistance to sit    Transfers  Needs one person to assist    Standing Unsupported with Eyes Closed  Able to stand 10 seconds with supervision    Standing Ubsupported with Feet Together  Able to place feet together independently and stand for 1 minute with supervision    From Standing, Reach Forward with Outstretched Arm  Can reach forward >12 cm safely (5")    From Standing Position, Pick up Object from Floor  Unable to try/needs assist to keep balance    From Standing Position, Turn to Look Behind Over each Shoulder  Needs supervision when turning    Turn 360 Degrees  Needs close supervision or verbal cueing   19.94 seconds to turn full 360 degrees   Standing Unsupported, Alternately Place Feet on Step/Stool  Needs assistance to keep from falling or unable to try    Standing Unsupported, One Foot in [employer] balance while stepping or standing    Standing on One Leg  Tries to lift leg/unable to hold 3 seconds but remains standing independently    Total Score  21      Timed Up and Go Test   TUG  Normal TUG      Self-Care   Self-Care  Other Self-Care Comments    Other Self-Care Comments   Discussed pt's return to therapy after time off due to death in family, last visit 16-Mar-2019.  Disuccsed pt's decline in functional mobility, recent falls.  Disucssed decline in mobility measures assessed today as compared to last check 01/27/2019.  Discussed pt remaining at fall risk and needing husband's close supervision for all transfers and mobility.  Discussed POC and pt'/husband's updated goals for therapy, to make sure they have a plan in place for home to help with carryover of  exercises once therapy has completed.             PT Education - 04/07/19 1157    Education Details  Discussion regarding return to therapy, POC-see self care    Person(s) Educated  Patient;Spouse    Methods  Explanation    Comprehension  Verbalized understanding          PT Long Term Goals - 04/07/19 1142      PT LONG TERM GOAL #1   Title  Pt/husband will report improvement in bed mobility sequencing and decreased assistance with supine>sit to minimal assistance, for improved safety with bed mobility.  TARGET 03/02/2019    Baseline  04/07/2019:  Husband reports pt gets in on her own; total assist to get out of bed    Time  4    Period  Weeks    Status  Not Met      PT LONG TERM GOAL #2   Title  Pt will improve gait velocity to at  least 1.5 ft/sec with rollator and supervision, for improved gait efficiency.    Baseline  1.73 ft/sec 01/25/2019 (no device); rollator 1.35 ft/sec; return to therapy 04/07/2019:  1.2 ft/sec    Time  4    Period  Weeks    Status  Not Met      PT LONG TERM GOAL #3   Title  Pt will perform sit<>stand transfers from surfaces <18", simulating sofa, with verbal cues and minimal assistance or less, for improved transfers at home.    Time  4    Period  Weeks    Status  Not Met      PT LONG TERM GOAL #4   Title  Pt will negotiate 4 steps with 1 handrial, with forward descending steps, with min guard assistance.    Baseline  min assist and cues 2 handrails    Time  4    Period  Weeks    Status  Not Met      PT LONG TERM GOAL #5   Title  Pt/husband will verbalize plan for continued optimal fitness upon d/c from PT.    Time  4    Period  Weeks    Status  Not Met            Plan - 04/07/19 1202    Clinical Impression Statement  Pt returns to OPPT today for first visit since 02/16/2019, due to pt having to cancel for death in family.  At that time, plan for PT was to complete a final telehealth visit for home safety education.  However, in  the past 6 weeks, husband reports definite regression in pt's mobility and at least 2 falls.  Objective measures show definite decline, with decreased gait velocity, decreased Berg score (36/56>21/56), increased time on TUG score.  She has not met any of the LTGs that were previously set.  Prior to pt's break from therapy, she had made considerable progress with mobility measures, and pt/husband/PT agree that additional course of skilled PT would be benefical to improve functional mobilitya nd decrease fall risk.    Personal Factors and Comorbidities  Comorbidity 3+    Comorbidities  PMH includes ankle fracture, HTN, hyperlipidemia, recurrent falls    Examination-Activity Limitations  Squat;Stairs;Stand;Transfers;Locomotion Level;Sit    Examination-Participation Restrictions  Community Activity    Stability/Clinical Decision Making  Evolving/Moderate complexity    Rehab Potential  Fair   MMSE score 16/30; good family support   PT Frequency  2x / week    PT Duration  8 weeks   per recert 1/51/7616, plus recert visit (total POC = 9 weeks)   PT Treatment/Interventions  ADLs/Self Care Home Management;Gait training;DME Instruction;Neuromuscular re-education;Balance training;Therapeutic exercise;Therapeutic activities;Functional mobility training;Patient/family education    PT Next Visit Plan  Revisit pt's HEP and  walking program; try to revise HEP for balance and strength in positions that are more functional for home carryover    Consulted and Agree with Plan of Care  Patient;Family member/caregiver    Family Member Consulted  Husband       Patient will benefit from skilled therapeutic intervention in order to improve the following deficits and impairments:  Abnormal gait, Difficulty walking, Decreased safety awareness, Decreased balance, Decreased mobility, Decreased strength  Visit Diagnosis: Other abnormalities of gait and mobility  Unsteadiness on feet  Muscle weakness  (generalized)     Problem List Patient Active Problem List   Diagnosis Date Noted  . Fluency disorder associated with underlying disease 02/08/2018  . Cognitive and neurobehavioral dysfunction 02/08/2018  . 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 Arthritis of carpometacarpal [facility]) joint of left thumb 01/24/2018  . Ankle fracture, left 09/03/2016  . Hand dysfunction 03/11/2016  . Symptomatic varicose veins, bilateral 03/11/2016  . Numbness of left thumb 03/11/2016  . Elevated BP 07/27/2014  . Bunion of left foot 07/26/2014  . Abnormality of gait 07/26/2014  . Cough 11/10/2013  . Post-nasal drainage 11/10/2013  . Abnormal urine odor 11/10/2013  . Visit for preventive health examination 07/28/2013  . Right anterior knee pain 07/22/2012  . Medicare annual wellness visit, subsequent 07/25/2011  . ANXIETY, SITUATIONAL 09/25/2009  . ARTHRITIS 09/25/2009  . Vitamin D deficiency 09/27/2008  . Hyperlipidemia 09/17/2008  . Elevated blood pressure reading without diagnosis of hypertension 09/17/2008  . COLONIC POLYPS, HX OF 09/17/2008  . PERSONAL HISTORY DISEASES SKIN&SUBCUT TISSUE 09/17/2008    Everline Mahaffy W. 04/07/2019, 12:09 PM  Frazier Butt., PT  [facility] [address] [place], [place], [address] Phone: [phone]   Fax:  [phone]  Name: Theone Bowell MRN: [id number] Date of Birth: June 26, 1940   New goals for recert period: PT Short Term Goals - 04/07/19 1211      PT SHORT TERM GOAL #1   Title  Pt will perform HEP with family supervision for improved transfers, balance, and gait.  05/05/2019 (may be delayed due to delay in scheduling)    Time  4    Period  Weeks    Status  Revised      PT SHORT TERM GOAL #2   Title  Pt will improve Berg Balance score to at least 28/56 for decreased fall risk.    Baseline  21/56 04/07/2019    Time  4    Period  Weeks    Status  Revised      PT SHORT TERM GOAL #3    Title  Pt will improve TUG score to less than or equal to 25 seconds for decreased fall risk.    Baseline  TUG 32.57 sec 04/07/2019    Time  4    Period  Weeks    Status  Revised      PT SHORT TERM GOAL #4   Title  Pt will improve gait velocity score to at least 1.5 ft/sec for decreased fall risk with gait.    Baseline  1.2 ft/sec 04/07/2019    Time  4    Period  Weeks    Status  Revised      PT SHORT TERM GOAL #5   Title  Pt/husband will verbalize understanding of progression of walking program for home, for continued gait efficiency and safety.    Time  4    Period  Weeks    Status  New  PT Long Term Goals - 04/07/19 1214      PT LONG TERM GOAL #1   Title  Pt/husband will report improvement in bed mobility sequencing and decreased assistance with supine>sit to minimal assistance, for improved safety with bed mobility.  06/02/2019 (May be delayed due to delayed scheduling)    Baseline  04/07/2019:  Husband reports pt gets in on her own; total assist to get out of bed    Time  8    Period  Weeks    Status  On-going      PT LONG TERM GOAL #2   Title  Pt will improve gait velocity to at  least 1.8 ft/sec with rollator and supervision, for improved gait efficiency.    Baseline  1.73 ft/sec 01/25/2019 (no device); rollator 1.35 ft/sec; return to therapy 04/07/2019:  1.2 ft/sec    Time  8    Period  Weeks    Status  Revised      PT LONG TERM GOAL #3   Title  Pt will perform sit<>stand transfers from surfaces <18", simulating sofa, with verbal cues and minimal assistance or less, for improved transfers at home.    Time  8    Period  Weeks    Status  On-going      PT LONG TERM GOAL #4   Title  Pt will improve Berg score to at least 35/56 for decreased fall risk.    Baseline  --    Time  8    Period  Weeks    Status  Revised      PT LONG TERM GOAL #5   Title  Pt/husband will verbalize plan for continued optimal fitness upon d/c from PT.    Time  8    Period  Weeks     Status  On-going      Mady Haagensen, [place] 04/07/19 12:19 PM Phone: [phone] Fax: [phone]

## 2019-04-11 ENCOUNTER — Encounter: Payer: Medicare Other | Admitting: Speech Pathology

## 2019-04-13 ENCOUNTER — Ambulatory Visit: Payer: Medicare PPO | Attending: Internal Medicine

## 2019-04-13 DIAGNOSIS — Z23 Encounter for immunization: Secondary | ICD-10-CM | POA: Insufficient documentation

## 2019-04-13 NOTE — Progress Notes (Signed)
   Covid-19 Vaccination Clinic  Name:  Chantilly Linskey    MRN: [id number] DOB: [birth year]  04/13/2019  Ms. Mennen was observed post Covid-19 immunization for 15 minutes without incidence. She was provided with Vaccine Information Sheet and instruction to access the V-Safe system.   Ms. Antenucci was instructed to call 911 with any severe reactions post vaccine: Marland Kitchen Difficulty breathing  . Swelling of your face and throat  . A fast heartbeat  . A bad rash all over your body  . Dizziness and weakness    Immunizations Administered    Name Date Dose VIS Date Route   Pfizer COVID-19 Vaccine 04/13/2019 10:43 AM 0.3 mL 02/17/2019 Intramuscular   Manufacturer: [employer], [employer]   Lot: [id number]   NDC: [address]-1

## 2019-04-14 ENCOUNTER — Ambulatory Visit: Payer: Medicare PPO | Admitting: Physical Therapy

## 2019-04-21 ENCOUNTER — Other Ambulatory Visit: Payer: Self-pay

## 2019-04-21 ENCOUNTER — Encounter: Payer: Self-pay | Admitting: Physical Therapy

## 2019-04-21 ENCOUNTER — Ambulatory Visit: Payer: Medicare PPO | Attending: Neurology | Admitting: Physical Therapy

## 2019-04-21 DIAGNOSIS — M6281 Muscle weakness (generalized): Secondary | ICD-10-CM | POA: Insufficient documentation

## 2019-04-21 DIAGNOSIS — R2689 Other abnormalities of gait and mobility: Secondary | ICD-10-CM | POA: Insufficient documentation

## 2019-04-21 DIAGNOSIS — R2681 Unsteadiness on feet: Secondary | ICD-10-CM | POA: Diagnosis not present

## 2019-04-21 NOTE — Therapy (Signed)
[facility] [address] [place], [place], [address] Phone: [phone]   Fax:  [phone]  Physical Therapy Treatment  Patient Details  Name: Pamela Alvarez MRN: [id number] Date of Birth: 1940/10/17 Referring Provider (PT): Dohmeier, Asencion Partridge   Encounter Date: 04/21/2019  PT End of Session - 04/21/19 1314    Visit Number  23    Number of Visits  39   per recert 05/22/1759   Date for PT Re-Evaluation  07/06/19    Authorization Type  [employer] Medicare-will need PROGRESS NOTE at Visit 30-32    PT Start Time  1147    PT Stop Time  1231    PT Time Calculation (min)  44 min    Equipment Utilized During Treatment  Gait belt    Activity Tolerance  Patient tolerated treatment well    Behavior During Therapy  [facility] for tasks assessed/performed   Decreased safety awareness with transfers      Past Medical History:  Diagnosis Date  . Cyst 07-2008   Cyst on back x2 that were drained  . H/O echocardiogram 02-2000  . Hx of colonic polyps   . Hyperlipidemia   . Hypertension   . Varicose veins of both lower extremities     Past Surgical History:  Procedure Laterality Date  . POLYPECTOMY     Colon  . TONSILLECTOMY    . TUBAL LIGATION      There were no vitals filed for this visit.  Subjective Assessment - 04/21/19 1206    Subjective  Denies any falls or changes.  Husband states that he has a hard time getting her to participate with HEP so he is having private Physical Therapy at home 3x/week starting next week.  Husband gives permission for this PTA to discuss patients care with private PTA.Marland Kitchen    Patient is accompained by:  Family member    Patient Stated Goals  be more mobile outside the house; better walking    Currently in Pain?  No/denies         [facility] Adult PT Treatment/Exercise - 04/21/19 0001      Transfers   Transfers  Sit to Stand;Stand to Sit    Sit to Stand  With upper extremity assist;From bed;4: Min  guard;4: Min assist;From chair/3-in-1    Sit to Stand Details  Verbal cues for sequencing;Verbal cues for precautions/safety;Manual facilitation for weight shifting;Manual facilitation for placement    Sit to Stand Details (indicate cue type and reason)  pt tending to have LOB upon initially standing episodes today in various directions.  Cues for control with descent along with turning before sitting and for hand placement.    Stand to Sit  4: Min guard;4: Min assist;Uncontrolled descent;To bed;To chair/3-in-1    Stand to Sit Details (indicate cue type and reason)  Verbal cues for sequencing;Verbal cues for technique    Number of Reps  10 reps;Other reps (comment);Other sets (comment)    Transfer Cueing  Seated on mat working on sit<>stand working on control and for strengthening.  Completed another 10 reps having pt come from standing to squat position then back to standing.  Pt with difficulty controling squat and at times returned to sitting vs standing back up.      Comments  With turning to sit, pt tends to turn too soon, needs assistance and cues to fully turn to sit.      Ambulation/Gait   Ambulation/Gait  Yes    Ambulation/Gait Assistance  4:  Min guard;4: Best boy (Feet)  110 Feet   x 3 and through out gym with activities   Assistive device  None   HHA   Gait Pattern  Step-through pattern;Decreased arm swing - right;Decreased arm swing - left;Decreased step length - right;Decreased step length - left;Shuffle;Decreased dorsiflexion - left;Decreased dorsiflexion - right    Ambulation Surface  Level;Indoor      Posture/Postural Control   Posture/Postural Control  Postural limitations    Postural Limitations  Forward head      High Level Balance   High Level Balance Activities  Other (comment)    High Level Balance Comments  Attempted standing marching at counter and pt initiallys tried several reps then stated she couldn't.  Had pt turn to side so she had one  hand on counter and another HHA by PTA.  Pt able to perform x 15 reps each x 2 sets with decreased AROM and increased WB thru hand held.  Then performed 3 reps each side of SLS x 5 seconds max.      Self-Care   Self-Care  Other Self-Care Comments    Other Self-Care Comments   Husband reports that he has hired private therapy for pt 2-3x/week starting next week.  She will be working with [employer], PT and Pilates instructor at their home.  Husband and pt gave permission for this PTA to contact Victorino Dike to coordinate pt's HEP and discuss any therapy related issues/notes.  Instructed husband that this PTA will reach out to [place] before next Tuesday (when pt has first home session).       Knee/Hip Exercises: Aerobic   Nustep  10 minutes (including 3 brief rest breaks) all 4 extremties workload 2 with steps per minute 70-72 through out.  completed 638 stesps.      Knee/Hip Exercises: Seated   Long Arc Quad  AROM;Both;2 sets;15 reps    Long [employer] Limitations  cues for full knee extension    Marching  Both;2 sets;15 reps;Limitations    Marching Limitations  cues for full ROM;pt gets easily distracted and confused when switching between types of exercises and needs max cues to perform correct exercise.             PT Education - 04/21/19 1314    Education Details  Discussing pt's PT POC and progress with private PTA, coordinating HEP with private PTA    Person(s) Educated  Patient;Spouse    Methods  Explanation    Comprehension  Verbalized understanding       PT Short Term Goals - 04/07/19 1211      PT SHORT TERM GOAL #1   Title  Pt will perform HEP with family supervision for improved transfers, balance, and gait.  05/05/2019 (may be delayed due to delay in scheduling)    Time  4    Period  Weeks    Status  Revised      PT SHORT TERM GOAL #2   Title  Pt will improve Berg Balance score to at least 28/56 for decreased fall risk.    Baseline  21/56 04/07/2019    Time  4     Period  Weeks    Status  Revised      PT SHORT TERM GOAL #3   Title  Pt will improve TUG score to less than or equal to 25 seconds for decreased fall risk.    Baseline  TUG 32.57 sec 04/07/2019  Time  4    Period  Weeks    Status  Revised      PT SHORT TERM GOAL #4   Title  Pt will improve gait velocity score to at least 1.5 ft/sec for decreased fall risk with gait.    Baseline  1.2 ft/sec 04/07/2019    Time  4    Period  Weeks    Status  Revised      PT SHORT TERM GOAL #5   Title  Pt/husband will verbalize understanding of progression of walking program for home, for continued gait efficiency and safety.    Time  4    Period  Weeks    Status  New        PT Long Term Goals - 04/07/19 1214      PT LONG TERM GOAL #1   Title  Pt/husband will report improvement in bed mobility sequencing and decreased assistance with supine>sit to minimal assistance, for improved safety with bed mobility.  06/02/2019 (May be delayed due to delayed scheduling)    Baseline  04/07/2019:  Husband reports pt gets in on her own; total assist to get out of bed    Time  8    Period  Weeks    Status  On-going      PT LONG TERM GOAL #2   Title  Pt will improve gait velocity to at  least 1.8 ft/sec with rollator and supervision, for improved gait efficiency.    Baseline  1.73 ft/sec 01/25/2019 (no device); rollator 1.35 ft/sec; return to therapy 04/07/2019:  1.2 ft/sec    Time  8    Period  Weeks    Status  Revised      PT LONG TERM GOAL #3   Title  Pt will perform sit<>stand transfers from surfaces <18", simulating sofa, with verbal cues and minimal assistance or less, for improved transfers at home.    Time  8    Period  Weeks    Status  On-going      PT LONG TERM GOAL #4   Title  Pt will improve Berg score to at least 35/56 for decreased fall risk.    Baseline  --    Time  8    Period  Weeks    Status  Revised      PT LONG TERM GOAL #5   Title  Pt/husband will verbalize plan for continued  optimal fitness upon d/c from PT.    Time  8    Period  Weeks    Status  On-going            Plan - 04/21/19 1316    Clinical Impression Statement  Skilled session focused on exercises, education and transfers.  Pt continues to get confused and easily distracted at times and needs max cues to perform correctly.  Husband has hired a private PTA to come to pt's house to reinforce exercises/mobility.  Continue PT per POC.    Personal Factors and Comorbidities  Comorbidity 3+    Comorbidities  PMH includes ankle fracture, HTN, hyperlipidemia, recurrent falls    Examination-Activity Limitations  Squat;Stairs;Stand;Transfers;Locomotion Level;Sit    Examination-Participation Restrictions  Community Activity    Stability/Clinical Decision Making  Evolving/Moderate complexity    Rehab Potential  Fair   MMSE score 16/30; good family support   PT Frequency  2x / week    PT Duration  8 weeks   per recert 04/07/2019, plus recert visit (total POC =  9 weeks)   PT Treatment/Interventions  ADLs/Self Care Home Management;Gait training;DME Instruction;Neuromuscular re-education;Balance training;Therapeutic exercise;Therapeutic activities;Functional mobility training;Patient/family education    PT Next Visit Plan  Discuss pt's POC with [employer], PTA.  Revisit pt's HEP and walking program after discussion with Edmonia Lynch and Agree with Plan of Care  Patient;Family member/caregiver    Family Member Consulted  Husband       Patient will benefit from skilled therapeutic intervention in order to improve the following deficits and impairments:  Abnormal gait, Difficulty walking, Decreased safety awareness, Decreased balance, Decreased mobility, Decreased strength  Visit Diagnosis: Other abnormalities of gait and mobility  Unsteadiness on feet     Problem List Patient Active Problem List   Diagnosis Date Noted  . Fluency disorder associated with underlying disease 02/08/2018  .  Cognitive and neurobehavioral dysfunction 02/08/2018  . 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 Arthritis of carpometacarpal [facility]) joint of left thumb 01/24/2018  . Ankle fracture, left 09/03/2016  . Hand dysfunction 03/11/2016  . Symptomatic varicose veins, bilateral 03/11/2016  . Numbness of left thumb 03/11/2016  . Elevated BP 07/27/2014  . Bunion of left foot 07/26/2014  . Abnormality of gait 07/26/2014  . Cough 11/10/2013  . Post-nasal drainage 11/10/2013  . Abnormal urine odor 11/10/2013  . Visit for preventive health examination 07/28/2013  . Right anterior knee pain 07/22/2012  . Medicare annual wellness visit, subsequent 07/25/2011  . ANXIETY, SITUATIONAL 09/25/2009  . ARTHRITIS 09/25/2009  . Vitamin D deficiency 09/27/2008  . Hyperlipidemia 09/17/2008  . Elevated blood pressure reading without diagnosis of hypertension 09/17/2008  . COLONIC POLYPS, HX OF 09/17/2008  . PERSONAL HISTORY DISEASES SKIN&SUBCUT TISSUE 09/17/2008    Newell Coral, PTA [facility] 04/21/19 3:00 PM Phone: [phone] Fax: [phone]   [facility] [address] [place], [place], [address] Phone: [phone]   Fax:  [phone]  Name: Rhyli Depaula MRN: [id number] Date of Birth: 19-Apr-1940

## 2019-04-26 ENCOUNTER — Other Ambulatory Visit: Payer: Self-pay | Admitting: Neurology

## 2019-04-28 ENCOUNTER — Ambulatory Visit: Payer: Medicare PPO | Admitting: Physical Therapy

## 2019-04-28 ENCOUNTER — Other Ambulatory Visit: Payer: Self-pay

## 2019-04-28 DIAGNOSIS — R2689 Other abnormalities of gait and mobility: Secondary | ICD-10-CM | POA: Diagnosis not present

## 2019-04-28 DIAGNOSIS — M6281 Muscle weakness (generalized): Secondary | ICD-10-CM | POA: Diagnosis not present

## 2019-04-28 DIAGNOSIS — R2681 Unsteadiness on feet: Secondary | ICD-10-CM | POA: Diagnosis not present

## 2019-04-28 DIAGNOSIS — H25813 Combined forms of age-related cataract, bilateral: Secondary | ICD-10-CM | POA: Diagnosis not present

## 2019-04-28 NOTE — Therapy (Signed)
[facility] [address] [place], [place], [address] Phone: [phone]   Fax:  [phone]  Physical Therapy Treatment  Patient Details  Name: Pamela Alvarez MRN: [id number] Date of Birth: 21-Nov-1940 Referring Provider (PT): Dohmeier, Porfirio Mylar   Encounter Date: 04/28/2019  PT End of Session - 04/28/19 1625    Visit Number  24    Number of Visits  39   per recert 04/07/2019   Date for PT Re-Evaluation  07/06/19    Authorization Type  [employer] Medicare-will need PROGRESS NOTE at Visit 30-32    PT Start Time  1234    PT Stop Time  1319    PT Time Calculation (min)  45 min    Equipment Utilized During Treatment  Gait belt    Activity Tolerance  Patient tolerated treatment well    Behavior During Therapy  [facility] for tasks assessed/performed   Decreased safety awareness with transfers      Past Medical History:  Diagnosis Date  . Cyst 07-2008   Cyst on back x2 that were drained  . H/O echocardiogram 02-2000  . Hx of colonic polyps   . Hyperlipidemia   . Hypertension   . Varicose veins of both lower extremities     Past Surgical History:  Procedure Laterality Date  . POLYPECTOMY     Colon  . TONSILLECTOMY    . TUBAL LIGATION      There were no vitals filed for this visit.  Subjective Assessment - 04/28/19 1238    Subjective  Per husband, pt is trying to use the rollator more at home.  Trying to work on picking up feet more.    Patient is accompained by:  Family member    Patient Stated Goals  be more mobile outside the house; better walking    Currently in Pain?  No/denies                       [facility] Adult PT Treatment/Exercise - 04/28/19 1618      Transfers   Transfers  Sit to Stand;Stand to Sit    Sit to Stand  4: Min guard;4: Min assist;From chair/3-in-1;With upper extremity assist    Sit to Stand Details  Verbal cues for sequencing;Verbal cues for technique;Tactile cues for placement    Stand to Sit  4: Min guard;4: Min assist;With upper extremity assist;To chair/3-in-1;Uncontrolled descent    Stand to Sit Details (indicate cue type and reason)  Verbal cues for sequencing;Verbal cues for technique    Number of Reps  Other reps (comment)   5 reps throughout session     Ambulation/Gait   Ambulation/Gait  Yes    Ambulation/Gait Assistance  4: Min guard;4: Min assist    Ambulation/Gait Assistance Details  PT provides assist at rollator to slow rollator and avoid veering to R side.  PT provides verbal cues for increased step length:  "kick" leg forward, "try stepping on balance disks" (from previous standing activity in session), "try kicking bean bags"-none seems to carryover for consistent increased step length.  PT does provide tactile cues through hips for improved lateral weigthshift, which does result in improved stance time/step length.    Ambulation Distance (Feet)  110 Feet   x 2, 20 ft x 2 (with rollator); no device 10 ft x 2   Assistive device  Rollator;None    Gait Pattern  Step-through pattern;Decreased arm swing - right;Decreased arm swing - left;Decreased step length - right;Decreased  step length - left;Shuffle;Decreased dorsiflexion - left;Decreased dorsiflexion - right    Ambulation Surface  Level;Indoor      Knee/Hip Exercises: Seated   Long Arc Quad  AROM;Both;2 sets;15 reps    Long [employer] Limitations  cues for full knee extension    Other Seated Knee/Hip Exercises  Seated ankle dorsiflexion, plantarflexion 2 sets x 10 reps, verbal, visual, tactile cues to initiate/complete    Marching  Both;2 sets;15 reps;Limitations    Marching Limitations  cues for full ROM, "squash the bug", pt able to keep high knees throughout          Balance Exercises - 04/28/19 1300      Balance Exercises: Standing   Stepping Strategy  Posterior;Lateral;10 reps;UE support;Anterior   See notes below   Other Standing Exercises  For step and weightshift (above), utilized flat  balance circles as target to help with increased step length.  PT physically assists LLE in posterior direction to take longer step.  Standing at locked rollator-attempting to kick beanbags; pt slides beanbags forward, but successful in increased stance time with this activity.  Performs x 3 reps each side.    Other Standing Exercises Comments  Single limb step taps to 4" step x 10 reps each, then alternating step taps x 10 to 4" block.          PT Short Term Goals - 04/07/19 1211      PT SHORT TERM GOAL #1   Title  Pt will perform HEP with family supervision for improved transfers, balance, and gait.  05/05/2019 (may be delayed due to delay in scheduling)    Time  4    Period  Weeks    Status  Revised      PT SHORT TERM GOAL #2   Title  Pt will improve Berg Balance score to at least 28/56 for decreased fall risk.    Baseline  21/56 04/07/2019    Time  4    Period  Weeks    Status  Revised      PT SHORT TERM GOAL #3   Title  Pt will improve TUG score to less than or equal to 25 seconds for decreased fall risk.    Baseline  TUG 32.57 sec 04/07/2019    Time  4    Period  Weeks    Status  Revised      PT SHORT TERM GOAL #4   Title  Pt will improve gait velocity score to at least 1.5 ft/sec for decreased fall risk with gait.    Baseline  1.2 ft/sec 04/07/2019    Time  4    Period  Weeks    Status  Revised      PT SHORT TERM GOAL #5   Title  Pt/husband will verbalize understanding of progression of walking program for home, for continued gait efficiency and safety.    Time  4    Period  Weeks    Status  New        PT Long Term Goals - 04/07/19 1214      PT LONG TERM GOAL #1   Title  Pt/husband will report improvement in bed mobility sequencing and decreased assistance with supine>sit to minimal assistance, for improved safety with bed mobility.  06/02/2019 (May be delayed due to delayed scheduling)    Baseline  04/07/2019:  Husband reports pt gets in on her own; total assist to  get out of bed    Time  8    Period  Weeks    Status  On-going      PT LONG TERM GOAL #2   Title  Pt will improve gait velocity to at  least 1.8 ft/sec with rollator and supervision, for improved gait efficiency.    Baseline  1.73 ft/sec 01/25/2019 (no device); rollator 1.35 ft/sec; return to therapy 04/07/2019:  1.2 ft/sec    Time  8    Period  Weeks    Status  Revised      PT LONG TERM GOAL #3   Title  Pt will perform sit<>stand transfers from surfaces <18", simulating sofa, with verbal cues and minimal assistance or less, for improved transfers at home.    Time  8    Period  Weeks    Status  On-going      PT LONG TERM GOAL #4   Title  Pt will improve Berg score to at least 35/56 for decreased fall risk.    Baseline  --    Time  8    Period  Weeks    Status  Revised      PT LONG TERM GOAL #5   Title  Pt/husband will verbalize plan for continued optimal fitness upon d/c from PT.    Time  8    Period  Weeks    Status  On-going            Plan - 04/28/19 1626    Clinical Impression Statement  Focused skilled PT session today on finding ways to increase step length, foot clearance with gait, including work on seated exercises, standing exercises.  With standing exercises, pt responds well to visual targets to increase step length.  With gait, pt's best response to increased step length comes with cues provided at hips for improved lateral weigthshfiting.  Discussed with pt/husband focusing on previous HEP with private pay PTA at home, and clinic sessions will focus on progression of functional strengthening, balance and gait mechanics.  Will continue to benefit from skilled PT to address gait and functional mobility and functional strength.    Personal Factors and Comorbidities  Comorbidity 3+    Comorbidities  PMH includes ankle fracture, HTN, hyperlipidemia, recurrent falls    Examination-Activity Limitations  Squat;Stairs;Stand;Transfers;Locomotion Level;Sit     Examination-Participation Restrictions  Community Activity    Stability/Clinical Decision Making  Evolving/Moderate complexity    Rehab Potential  Fair   MMSE score 16/30; good family support   PT Frequency  2x / week    PT Duration  8 weeks   per recert 06/16/7351, plus recert visit (total POC = 9 weeks)   PT Treatment/Interventions  ADLs/Self Care Home Management;Gait training;DME Instruction;Neuromuscular re-education;Balance training;Therapeutic exercise;Therapeutic activities;Functional mobility training;Patient/family education    PT Next Visit Plan  Ask if PTA at home (private pay) has been able to work on [place]; conitnue in clinic to work on standing/gait exercises to improve foot clearance, functional strength.    Consulted and Agree with Plan of Care  Patient;Family member/caregiver    Family Member Consulted  Husband       Patient will benefit from skilled therapeutic intervention in order to improve the following deficits and impairments:  Abnormal gait, Difficulty walking, Decreased safety awareness, Decreased balance, Decreased mobility, Decreased strength  Visit Diagnosis: Unsteadiness on feet  Muscle weakness (generalized)  Other abnormalities of gait and mobility     Problem List Patient Active Problem List   Diagnosis Date Noted  . Fluency disorder associated with  underlying disease 02/08/2018  . Cognitive and neurobehavioral dysfunction 02/08/2018  . 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 Arthritis of carpometacarpal [facility]) joint of left thumb 01/24/2018  . Ankle fracture, left 09/03/2016  . Hand dysfunction 03/11/2016  . Symptomatic varicose veins, bilateral 03/11/2016  . Numbness of left thumb 03/11/2016  . Elevated BP 07/27/2014  . Bunion of left foot 07/26/2014  . Abnormality of gait 07/26/2014  . Cough 11/10/2013  . Post-nasal drainage 11/10/2013  . Abnormal urine odor 11/10/2013  . Visit for preventive health examination 07/28/2013  . Right anterior  knee pain 07/22/2012  . Medicare annual wellness visit, subsequent 07/25/2011  . ANXIETY, SITUATIONAL 09/25/2009  . ARTHRITIS 09/25/2009  . Vitamin D deficiency 09/27/2008  . Hyperlipidemia 09/17/2008  . Elevated blood pressure reading without diagnosis of hypertension 09/17/2008  . COLONIC POLYPS, HX OF 09/17/2008  . PERSONAL HISTORY DISEASES SKIN&SUBCUT TISSUE 09/17/2008    Deniece Rankin W. 04/28/2019, 4:32 PM  Gean Maidens., PT   [facility] [address] [place], [place], [address] Phone: [phone]   Fax:  [phone]  Name: Pamela Alvarez MRN: [id number] Date of Birth: 1940/03/19

## 2019-05-01 ENCOUNTER — Ambulatory Visit: Payer: Medicare PPO | Admitting: Physical Therapy

## 2019-05-04 ENCOUNTER — Ambulatory Visit: Payer: Medicare PPO | Admitting: Physical Therapy

## 2019-05-04 ENCOUNTER — Other Ambulatory Visit: Payer: Self-pay

## 2019-05-04 DIAGNOSIS — M6281 Muscle weakness (generalized): Secondary | ICD-10-CM

## 2019-05-04 DIAGNOSIS — R2681 Unsteadiness on feet: Secondary | ICD-10-CM | POA: Diagnosis not present

## 2019-05-04 DIAGNOSIS — R2689 Other abnormalities of gait and mobility: Secondary | ICD-10-CM | POA: Diagnosis not present

## 2019-05-04 NOTE — Therapy (Signed)
[facility] [address] [place], [place], [address] Phone: [phone]   Fax:  [phone]  Physical Therapy Treatment  Patient Details  Name: Pamela Alvarez MRN: [id number] Date of Birth: [birth year] Referring Provider (PT): Dohmeier, Asencion Partridge   Encounter Date: 05/04/2019  PT End of Session - 05/04/19 1249    Visit Number  25    Number of Visits  39   per recert 9/44/9675   Date for PT Re-Evaluation  07/06/19    Authorization Type  [employer]-will need PROGRESS NOTE at Visit 30-32    PT Start Time  1150    PT Stop Time  1230    PT Time Calculation (min)  40 min    Equipment Utilized During Treatment  Gait belt    Activity Tolerance  Patient tolerated treatment well    Behavior During Therapy  [facility] for tasks assessed/performed   Decreased safety awareness with transfers      Past Medical History:  Diagnosis Date  . Cyst 07-2008   Cyst on back x2 that were drained  . H/O echocardiogram 02-2000  . Hx of colonic polyps   . Hyperlipidemia   . Hypertension   . Varicose veins of both lower extremities     Past Surgical History:  Procedure Laterality Date  . POLYPECTOMY     Colon  . TONSILLECTOMY    . TUBAL LIGATION      There were no vitals filed for this visit.      [facility] Adult PT Treatment/Exercise - 05/04/19 0001      Transfers   Transfers  Sit to Stand;Stand to Sit    Sit to Stand  4: Min guard;4: Min assist;From chair/3-in-1;With upper extremity assist    Sit to Stand Details  Verbal cues for sequencing;Verbal cues for technique;Tactile cues for placement    Stand to Sit  4: Min guard;4: Min assist;With upper extremity assist;To chair/3-in-1;Uncontrolled descent    Stand to Sit Details (indicate cue type and reason)  Verbal cues for sequencing;Verbal cues for technique    Number of Reps  10 reps   and around activities in gym   Comments  With turning to sit, pt tends to turn too soon, needs  assistance and cues to fully turn to sit.      Ambulation/Gait   Ambulation/Gait  Yes    Ambulation/Gait Assistance  4: Min guard;4: Min assist    Ambulation Distance (Feet)  300 Feet   x 3 and 110 x 1   Assistive device  Rollator    Gait Pattern  Step-through pattern;Decreased arm swing - right;Decreased arm swing - left;Decreased step length - right;Decreased step length - left;Shuffle;Decreased dorsiflexion - left;Decreased dorsiflexion - right    Ambulation Surface  Level;Indoor    Gait Comments  needs cues to steer rollator at times to keep pt from running into objects (either side).  Use metronome on last ambulation to see if beat helped with increased cadence.  Slight increase noted.  PTA provided cues throughout for increased step length, increased cadence and increased foot clearance.      Knee/Hip Exercises: Aerobic   Nustep  Workload 2 all 4 extremities x 10  minutes with only one brief rest break of 20 seconds and with steps per minute around 55-60      Knee/Hip Exercises: Seated   Marching  Strengthening;AROM;Both;15 reps;Limitations    Marching Limitations  needed constant verbal and visual cues for high marching to "stomp or squash  the bug"                PT Short Term Goals - 04/07/19 1211      PT SHORT TERM GOAL #1   Title  Pt will perform HEP with family supervision for improved transfers, balance, and gait.  05/05/2019 (may be delayed due to delay in scheduling)    Time  4    Period  Weeks    Status  Revised      PT SHORT TERM GOAL #2   Title  Pt will improve Berg Balance score to at least 28/56 for decreased fall risk.    Baseline  21/56 04/07/2019    Time  4    Period  Weeks    Status  Revised      PT SHORT TERM GOAL #3   Title  Pt will improve TUG score to less than or equal to 25 seconds for decreased fall risk.    Baseline  TUG 32.57 sec 04/07/2019    Time  4    Period  Weeks    Status  Revised      PT SHORT TERM GOAL #4   Title  Pt will  improve gait velocity score to at least 1.5 ft/sec for decreased fall risk with gait.    Baseline  1.2 ft/sec 04/07/2019    Time  4    Period  Weeks    Status  Revised      PT SHORT TERM GOAL #5   Title  Pt/husband will verbalize understanding of progression of walking program for home, for continued gait efficiency and safety.    Time  4    Period  Weeks    Status  New        PT Long Term Goals - 04/07/19 1214      PT LONG TERM GOAL #1   Title  Pt/husband will report improvement in bed mobility sequencing and decreased assistance with supine>sit to minimal assistance, for improved safety with bed mobility.  06/02/2019 (May be delayed due to delayed scheduling)    Baseline  04/07/2019:  Husband reports pt gets in on her own; total assist to get out of bed    Time  8    Period  Weeks    Status  On-going      PT LONG TERM GOAL #2   Title  Pt will improve gait velocity to at  least 1.8 ft/sec with rollator and supervision, for improved gait efficiency.    Baseline  1.73 ft/sec 01/25/2019 (no device); rollator 1.35 ft/sec; return to therapy 04/07/2019:  1.2 ft/sec    Time  8    Period  Weeks    Status  Revised      PT LONG TERM GOAL #3   Title  Pt will perform sit<>stand transfers from surfaces <18", simulating sofa, with verbal cues and minimal assistance or less, for improved transfers at home.    Time  8    Period  Weeks    Status  On-going      PT LONG TERM GOAL #4   Title  Pt will improve Berg score to at least 35/56 for decreased fall risk.    Baseline  --    Time  8    Period  Weeks    Status  Revised      PT LONG TERM GOAL #5   Title  Pt/husband will verbalize plan for continued optimal fitness upon d/c from PT.  Time  8    Period  Weeks    Status  On-going            Plan - 05/04/19 1249    Clinical Impression Statement  Pt with improved carryover today during session regarding sit<>stand.  Pt with improved ability to follow commands today and with  increased endurance.  Continues to need constant cues through out session.  Continue PT per POC.    Personal Factors and Comorbidities  Comorbidity 3+    Comorbidities  PMH includes ankle fracture, HTN, hyperlipidemia, recurrent falls    Examination-Activity Limitations  Squat;Stairs;Stand;Transfers;Locomotion Level;Sit    Examination-Participation Restrictions  Community Activity    Stability/Clinical Decision Making  Evolving/Moderate complexity    Rehab Potential  Fair   MMSE score 16/30; good family support   PT Frequency  2x / week    PT Duration  8 weeks   per recert 04/07/2019, plus recert visit (total POC = 9 weeks)   PT Treatment/Interventions  ADLs/Self Care Home Management;Gait training;DME Instruction;Neuromuscular re-education;Balance training;Therapeutic exercise;Therapeutic activities;Functional mobility training;Patient/family education    PT Next Visit Plan  Conitnue in clinic to work on standing/gait exercises to improve foot clearance, functional strength.    Consulted and Agree with Plan of Care  Patient;Family member/caregiver    Family Member Consulted  Husband       Patient will benefit from skilled therapeutic intervention in order to improve the following deficits and impairments:  Abnormal gait, Difficulty walking, Decreased safety awareness, Decreased balance, Decreased mobility, Decreased strength  Visit Diagnosis: Unsteadiness on feet  Muscle weakness (generalized)  Other abnormalities of gait and mobility     Problem List Patient Active Problem List   Diagnosis Date Noted  . Fluency disorder associated with underlying disease 02/08/2018  . Cognitive and neurobehavioral dysfunction 02/08/2018  . 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 Arthritis of carpometacarpal [facility]) joint of left thumb 01/24/2018  . Ankle fracture, left 09/03/2016  . Hand dysfunction 03/11/2016  . Symptomatic varicose veins, bilateral 03/11/2016  . Numbness of left thumb  03/11/2016  . Elevated BP 07/27/2014  . Bunion of left foot 07/26/2014  . Abnormality of gait 07/26/2014  . Cough 11/10/2013  . Post-nasal drainage 11/10/2013  . Abnormal urine odor 11/10/2013  . Visit for preventive health examination 07/28/2013  . Right anterior knee pain 07/22/2012  . Medicare annual wellness visit, subsequent 07/25/2011  . ANXIETY, SITUATIONAL 09/25/2009  . ARTHRITIS 09/25/2009  . Vitamin D deficiency 09/27/2008  . Hyperlipidemia 09/17/2008  . Elevated blood pressure reading without diagnosis of hypertension 09/17/2008  . COLONIC POLYPS, HX OF 09/17/2008  . PERSONAL HISTORY DISEASES SKIN&SUBCUT TISSUE 09/17/2008    Newell Coral, PTA [facility] 05/04/19 12:55 PM Phone: [phone] Fax: [phone]   [facility] [address] [place], [place], [address] Phone: [phone]   Fax:  [phone]  Name: [place]ela Alvarez MRN: [id number] Date of Birth: 02-23-1941

## 2019-05-05 ENCOUNTER — Ambulatory Visit: Payer: Medicare PPO | Admitting: Physical Therapy

## 2019-05-05 DIAGNOSIS — M6281 Muscle weakness (generalized): Secondary | ICD-10-CM

## 2019-05-05 DIAGNOSIS — R2681 Unsteadiness on feet: Secondary | ICD-10-CM

## 2019-05-05 DIAGNOSIS — R2689 Other abnormalities of gait and mobility: Secondary | ICD-10-CM | POA: Diagnosis not present

## 2019-05-05 NOTE — Therapy (Signed)
[facility] [address] [place], [place], [address] Phone: [phone]   Fax:  [phone]  Physical Therapy Treatment  Patient Details  Name: Pamela Alvarez MRN: [id number] Date of Birth: 03/22/1940 Referring Provider (PT): Dohmeier, Asencion Partridge   Encounter Date: 05/05/2019  PT End of Session - 05/05/19 1641    Visit Number  26    Number of Visits  39   per recert 2/35/3614   Date for PT Re-Evaluation  07/06/19    Authorization Type  [employer] Medicare-will need PROGRESS NOTE at Visit 30-32    PT Start Time  1234    PT Stop Time  1314    PT Time Calculation (min)  40 min    Equipment Utilized During Treatment  Gait belt    Activity Tolerance  Patient tolerated treatment well    Behavior During Therapy  [facility] for tasks assessed/performed   Decreased safety awareness with transfers      Past Medical History:  Diagnosis Date  . Cyst 07-2008   Cyst on back x2 that were drained  . H/O echocardiogram 02-2000  . Hx of colonic polyps   . Hyperlipidemia   . Hypertension   . Varicose veins of both lower extremities     Past Surgical History:  Procedure Laterality Date  . POLYPECTOMY     Colon  . TONSILLECTOMY    . TUBAL LIGATION      There were no vitals filed for this visit.  Subjective Assessment - 05/05/19 1238    Subjective  Made a deal with Pamela Alvarez yesterday to bring the rollator into therapy sessions, so she did today.    Patient is accompained by:  Family member    Patient Stated Goals  be more mobile outside the house; better walking    Currently in Pain?  No/denies                       [facility] Adult PT Treatment/Exercise - 05/05/19 1245      Transfers   Transfers  Sit to Stand;Stand to Sit    Sit to Stand  4: Min guard;4: Min assist;From chair/3-in-1;With upper extremity assist    Sit to Stand Details  Verbal cues for sequencing;Verbal cues for technique;Tactile cues for placement    Stand to  Sit  4: Min guard;4: Min assist;With upper extremity assist;To chair/3-in-1;Uncontrolled descent    Stand to Sit Details (indicate cue type and reason)  Verbal cues for sequencing;Verbal cues for technique    Stand to Sit Details  Prior to sitting, VCs to lock rollator, reach back, then sit.    Number of Reps  --   at least 4 reps throughout session   Transfer Cueing  Provided consistent cueing each time with sit to stand.  Pt appears to come closer to chair to prepare to sit when using rollator.      Ambulation/Gait   Ambulation/Gait  Yes    Ambulation/Gait Assistance  4: Min guard;4: Min assist    Ambulation/Gait Assistance Details  PT provides assist to slow rollator, cues at hip for lateral weigthshfiting to encourage slightly longer stance time/step length (does not carryover once cueing removed)    Ambulation Distance (Feet)  230 Feet   40 ft x 2; 80 ft   Assistive device  Rollator    Gait Pattern  Step-through pattern;Decreased arm swing - right;Decreased arm swing - left;Decreased step length - right;Decreased step length - left;Shuffle;Decreased dorsiflexion - left;Decreased  dorsiflexion - right    Ambulation Surface  Level;Indoor      High Level Balance   High Level Balance Activities  Other (comment)    High Level Balance Comments  In parallel bars with BUE support to work on SLS:  Repeated step taps to 2" block x 10 reps each leg, then alternating step taps x 10 each leg; with large green roller in front of 2" block, had pt perform repeated step taps x10 each leg, then alternating step taps x 10 each leg (for longer distance reach to tap).  Rolling green roller 2 sets x 10 reps each leg; then rolling ball x 5 reps each leg, 2 sets.  Marching in place, cues to "stomp", x 10 reps each leg.  Gait directly after these activities noted improved foot clearance.      Knee/Hip Exercises: Aerobic   Nustep  Workload 2 all 4 extremities x 10  minutes with only one brief rest break of 20  seconds and with steps per minute 65-70               PT Short Term Goals - 04/07/19 1211      PT SHORT TERM GOAL #1   Title  Pt will perform HEP with family supervision for improved transfers, balance, and gait.  05/05/2019 (may be delayed due to delay in scheduling)    Time  4    Period  Weeks    Status  Revised      PT SHORT TERM GOAL #2   Title  Pt will improve Berg Balance score to at least 28/56 for decreased fall risk.    Baseline  21/56 04/07/2019    Time  4    Period  Weeks    Status  Revised      PT SHORT TERM GOAL #3   Title  Pt will improve TUG score to less than or equal to 25 seconds for decreased fall risk.    Baseline  TUG 32.57 sec 04/07/2019    Time  4    Period  Weeks    Status  Revised      PT SHORT TERM GOAL #4   Title  Pt will improve gait velocity score to at least 1.5 ft/sec for decreased fall risk with gait.    Baseline  1.2 ft/sec 04/07/2019    Time  4    Period  Weeks    Status  Revised      PT SHORT TERM GOAL #5   Title  Pt/husband will verbalize understanding of progression of walking program for home, for continued gait efficiency and safety.    Time  4    Period  Weeks    Status  New        PT Long Term Goals - 04/07/19 1214      PT LONG TERM GOAL #1   Title  Pt/husband will report improvement in bed mobility sequencing and decreased assistance with supine>sit to minimal assistance, for improved safety with bed mobility.  06/02/2019 (May be delayed due to delayed scheduling)    Baseline  04/07/2019:  Husband reports pt gets in on her own; total assist to get out of bed    Time  8    Period  Weeks    Status  On-going      PT LONG TERM GOAL #2   Title  Pt will improve gait velocity to at  least 1.8 ft/sec with rollator and supervision, for improved  gait efficiency.    Baseline  1.73 ft/sec 01/25/2019 (no device); rollator 1.35 ft/sec; return to therapy 04/07/2019:  1.2 ft/sec    Time  8    Period  Weeks    Status  Revised      PT  LONG TERM GOAL #3   Title  Pt will perform sit<>stand transfers from surfaces <18", simulating sofa, with verbal cues and minimal assistance or less, for improved transfers at home.    Time  8    Period  Weeks    Status  On-going      PT LONG TERM GOAL #4   Title  Pt will improve Berg score to at least 35/56 for decreased fall risk.    Baseline  --    Time  8    Period  Weeks    Status  Revised      PT LONG TERM GOAL #5   Title  Pt/husband will verbalize plan for continued optimal fitness upon d/c from PT.    Time  8    Period  Weeks    Status  On-going            Plan - 05/05/19 1641    Clinical Impression Statement  Pt presents today with rollator to PT session, and transfers (with constant cues) are safer with rollator than when not using (in previous sessions).  Pt performs standing activities in parallel bars for SLS and gait immediately following notes pt having improved foot clearance.    Personal Factors and Comorbidities  Comorbidity 3+    Comorbidities  PMH includes ankle fracture, HTN, hyperlipidemia, recurrent falls    Examination-Activity Limitations  Squat;Stairs;Stand;Transfers;Locomotion Level;Sit    Examination-Participation Restrictions  Community Activity    Stability/Clinical Decision Making  Evolving/Moderate complexity    Rehab Potential  Fair   MMSE score 16/30; good family support   PT Frequency  2x / week    PT Duration  8 weeks   per recert 04/07/2019, plus recert visit (total POC = 9 weeks)   PT Treatment/Interventions  ADLs/Self Care Home Management;Gait training;DME Instruction;Neuromuscular re-education;Balance training;Therapeutic exercise;Therapeutic activities;Functional mobility training;Patient/family education    PT Next Visit Plan  Check STGs next week; continue to work on standing/gait exercises to improve foot clearance-pt did well in parallel bars    Consulted and Agree with Plan of Care  Patient;Family member/caregiver    Family Member  Consulted  Husband       Patient will benefit from skilled therapeutic intervention in order to improve the following deficits and impairments:  Abnormal gait, Difficulty walking, Decreased safety awareness, Decreased balance, Decreased mobility, Decreased strength  Visit Diagnosis: Unsteadiness on feet  Other abnormalities of gait and mobility  Muscle weakness (generalized)     Problem List Patient Active Problem List   Diagnosis Date Noted  . Fluency disorder associated with underlying disease 02/08/2018  . Cognitive and neurobehavioral dysfunction 02/08/2018  . 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 Arthritis of carpometacarpal [facility]) joint of left thumb 01/24/2018  . Ankle fracture, left 09/03/2016  . Hand dysfunction 03/11/2016  . Symptomatic varicose veins, bilateral 03/11/2016  . Numbness of left thumb 03/11/2016  . Elevated BP 07/27/2014  . Bunion of left foot 07/26/2014  . Abnormality of gait 07/26/2014  . Cough 11/10/2013  . Post-nasal drainage 11/10/2013  . Abnormal urine odor 11/10/2013  . Visit for preventive health examination 07/28/2013  . Right anterior knee pain 07/22/2012  . Medicare annual wellness visit, subsequent 07/25/2011  .  ANXIETY, SITUATIONAL 09/25/2009  . ARTHRITIS 09/25/2009  . Vitamin D deficiency 09/27/2008  . Hyperlipidemia 09/17/2008  . Elevated blood pressure reading without diagnosis of hypertension 09/17/2008  . COLONIC POLYPS, HX OF 09/17/2008  . PERSONAL HISTORY DISEASES SKIN&SUBCUT TISSUE 09/17/2008    [place] Alvarez W. 05/05/2019, 4:45 PM  Gean Maidens., PT   [facility] [address] [place], [place], [address] Phone: [phone]   Fax:  [phone]  Name: Pamela Alvarez MRN: [id number] Date of Birth: March 12, 1940

## 2019-05-08 ENCOUNTER — Ambulatory Visit: Payer: Medicare PPO | Attending: Internal Medicine

## 2019-05-08 ENCOUNTER — Ambulatory Visit: Payer: Medicare PPO | Admitting: Physical Therapy

## 2019-05-08 DIAGNOSIS — Z23 Encounter for immunization: Secondary | ICD-10-CM

## 2019-05-08 NOTE — Progress Notes (Signed)
   Covid-19 Vaccination Clinic  Name:  Pamela Alvarez    MRN: [id number] DOB: 1940-09-18  05/08/2019  [name] was observed post Covid-19 immunization for 15 minutes without incidence. She was provided with Vaccine Information Sheet and instruction to access the V-Safe system.   [name] was instructed to call 911 with any severe reactions post vaccine: Marland Kitchen Difficulty breathing  . Swelling of your face and throat  . A fast heartbeat  . A bad rash all over your body  . Dizziness and weakness    Immunizations Administered    Name Date Dose VIS Date Route   Pfizer COVID-19 Vaccine 05/08/2019  2:54 PM 0.3 mL 02/17/2019 Intramuscular   Manufacturer: [employer], [employer]   Lot: [id number]   NDC: [address]-3

## 2019-05-12 ENCOUNTER — Ambulatory Visit: Payer: Medicare PPO | Attending: Neurology | Admitting: Physical Therapy

## 2019-05-12 ENCOUNTER — Other Ambulatory Visit: Payer: Self-pay

## 2019-05-12 ENCOUNTER — Encounter: Payer: Self-pay | Admitting: Physical Therapy

## 2019-05-12 DIAGNOSIS — R2681 Unsteadiness on feet: Secondary | ICD-10-CM | POA: Insufficient documentation

## 2019-05-12 DIAGNOSIS — M6281 Muscle weakness (generalized): Secondary | ICD-10-CM | POA: Insufficient documentation

## 2019-05-12 DIAGNOSIS — R2689 Other abnormalities of gait and mobility: Secondary | ICD-10-CM | POA: Insufficient documentation

## 2019-05-12 NOTE — Therapy (Addendum)
[facility] [address] [place], [place], [address] Phone: [phone]   Fax:  [phone]  Physical Therapy Treatment/10th Visit Progress Note  Patient Details  Name: Pamela Alvarez MRN: [id number] Date of Birth: 27-Nov-1940 Referring Provider (PT): Dohmeier, Asencion Partridge   Encounter Date: 05/12/2019  PT End of Session - 05/12/19 1311    Visit Number  30    Number of Visits  39   per recert 7/74/1287   Date for PT Re-Evaluation  07/06/19    Authorization Type  [employer]-will need PROGRESS NOTE at Visit 30-32    PT Start Time  0932    PT Stop Time  1017    PT Time Calculation (min)  45 min    Equipment Utilized During Treatment  Gait belt    Activity Tolerance  Patient tolerated treatment well;Other (comment)   pt not following commands quite as well today   Behavior During Therapy  WFL for tasks assessed/performed   Decreased safety awareness with transfers      Past Medical History:  Diagnosis Date  . Cyst 07-2008   Cyst on back x2 that were drained  . H/O echocardiogram 02-2000  . Hx of colonic polyps   . Hyperlipidemia   . Hypertension   . Varicose veins of both lower extremities     Past Surgical History:  Procedure Laterality Date  . POLYPECTOMY     Colon  . TONSILLECTOMY    . TUBAL LIGATION      There were no vitals filed for this visit.  Subjective Assessment - 05/12/19 0937    Subjective  Denies any falls or changes.  Got 2nd covid vaccine and was a little tired.    Patient is accompained by:  Family member    Patient Stated Goals  be more mobile outside the house; better walking    Currently in Pain?  No/denies           [facility] Adult PT Treatment/Exercise - 05/12/19 0001      Transfers   Transfers  Sit to Stand;Stand to Sit    Sit to Stand  5: Supervision;4: Min guard    Sit to Stand Details  Verbal cues for sequencing;Verbal cues for technique;Tactile cues for placement    Sit to  Stand Details (indicate cue type and reason)  pt with improved sit<>stand today overall.  did still have a few episodes where she needed cues for weight shift forward along with hand placement and to control descent.    Stand to Sit  5: Supervision;4: Min guard    Stand to Sit Details (indicate cue type and reason)  Verbal cues for sequencing;Verbal cues for technique    Comments  With turning to sit, pt tends to turn too soon, needs assistance and cues to fully turn to sit.      Ambulation/Gait   Ambulation/Gait  Yes    Ambulation/Gait Assistance  4: Min guard;4: Min assist    Ambulation/Gait Assistance Details  Still tending to run into objects on the R side, cues for turns to chair (tends to turn before close to chair)    Ambulation Distance (Feet)  230 Feet   x 1 and 110 x 1 and around clinic for activities   Assistive device  Rollator    Gait Pattern  Step-through pattern;Decreased arm swing - right;Decreased arm swing - left;Decreased step length - right;Decreased step length - left;Shuffle;Decreased dorsiflexion - left;Decreased dorsiflexion - right    Ambulation Surface  Level;Indoor    Gait velocity  28.69 seconds=1.14 ft/sec   pt with slower movements overall today during session   Gait Comments  Pt continues with decreased cadence with gait.  Did have improvement in maintaining proper distance to rollator today.      Standardized Balance Assessment   Standardized Balance Assessment  Berg Balance Test      Berg Balance Test   Sit to Stand  Able to stand  independently using hands    Standing Unsupported  Able to stand 2 minutes with supervision    Sitting with Back Unsupported but Feet Supported on Floor or Stool  Able to sit safely and securely 2 minutes    Stand to Sit  Controls descent by using hands    Transfers  Able to transfer with verbal cueing and /or supervision   verbal cues for forward lean after several failed attempts    Standing Unsupported with Eyes Closed  Able  to stand 10 seconds with supervision    Standing Ubsupported with Feet Together  Able to place feet together independently and stand for 1 minute with supervision    From Standing, Reach Forward with Outstretched Arm  Can reach forward >12 cm safely (5")    From Standing Position, Pick up Object from Floor  Unable to try/needs assist to keep balance    From Standing Position, Turn to Look Behind Over each Shoulder  Needs supervision when turning    Turn 360 Degrees  Needs close supervision or verbal cueing    Standing Unsupported, Alternately Place Feet on Step/Stool  Needs assistance to keep from falling or unable to try    Standing Unsupported, One Foot in Front  Needs help to step but can hold 15 seconds    Standing on One Leg  Tries to lift leg/unable to hold 3 seconds but remains standing independently    Total Score  28      Timed Up and Go Test   TUG  Normal TUG    Normal TUG (seconds)  --   72 sec 1st attempt with rollator;39 sec with HHA     Exercises   Exercises  Knee/Hip      Knee/Hip Exercises: Aerobic   Nustep  Workload 2 all 4 extremities x 8 minutes with only one brief rest break of 20 seconds and with steps per minute 65-70               PT Short Term Goals - 05/12/19 1312      PT SHORT TERM GOAL #1   Title  Pt will perform HEP with family supervision for improved transfers, balance, and gait.  05/05/2019 (may be delayed due to delay in scheduling)    Baseline  3/5-pt working with private duty PT in addition to Outpatient and trying to develop consistent plan for both.  Husband states pt resistive to performing HEP with his assistance    Time  4    Period  Weeks    Status  On-going      PT SHORT TERM GOAL #2   Title  Pt will improve Berg Balance score to at least 28/56 for decreased fall risk.    Baseline  28/56 on 05/12/19    Time  4    Period  Weeks    Status  Achieved      PT SHORT TERM GOAL #3   Title  Pt will improve TUG score to less than or equal  to 25 seconds  for decreased fall risk.    Baseline  79 seconds 1st attempt and 32 seconds second attempt on 05/12/19    Time  4    Period  Weeks    Status  Not Met      PT SHORT TERM GOAL #4   Title  Pt will improve gait velocity score to at least 1.5 ft/sec for decreased fall risk with gait.    Baseline  1.14 ft/sec on 05/12/19    Time  4    Period  Weeks    Status  Not Met      PT SHORT TERM GOAL #5   Title  Pt/husband will verbalize understanding of progression of walking program for home, for continued gait efficiency and safety.    Time  4    Period  Weeks    Status  On-going        PT Long Term Goals - 04/07/19 1214      PT LONG TERM GOAL #1   Title  Pt/husband will report improvement in bed mobility sequencing and decreased assistance with supine>sit to minimal assistance, for improved safety with bed mobility.  06/02/2019 (May be delayed due to delayed scheduling)    Baseline  04/07/2019:  Husband reports pt gets in on her own; total assist to get out of bed    Time  8    Period  Weeks    Status  On-going      PT LONG TERM GOAL #2   Title  Pt will improve gait velocity to at  least 1.8 ft/sec with rollator and supervision, for improved gait efficiency.    Baseline  1.73 ft/sec 01/25/2019 (no device); rollator 1.35 ft/sec; return to therapy 04/07/2019:  1.2 ft/sec    Time  8    Period  Weeks    Status  Revised      PT LONG TERM GOAL #3   Title  Pt will perform sit<>stand transfers from surfaces <18", simulating sofa, with verbal cues and minimal assistance or less, for improved transfers at home.    Time  8    Period  Weeks    Status  On-going      PT LONG TERM GOAL #4   Title  Pt will improve Berg score to at least 35/56 for decreased fall risk.    Baseline  --    Time  8    Period  Weeks    Status  Revised      PT LONG TERM GOAL #5   Title  Pt/husband will verbalize plan for continued optimal fitness upon d/c from PT.    Time  8    Period  Weeks    Status   On-going            Plan - 05/12/19 1315    Clinical Impression Statement  Pt met STG 2, STG's 1 and 5 ongoing as husband has hired a Retail banker PT and working to coordinate a consistent Retail banker.  STG's 3 and 4 not met as pt was not moving as fast today and having decreased ability to follow instructions.  Pt's cognitive status varies from day to day with regard to following instructions and her carryover.  Continue PT per POC.    Personal Factors and Comorbidities  Comorbidity 3+    Comorbidities  PMH includes ankle fracture, HTN, hyperlipidemia, recurrent falls    Examination-Activity Limitations  Squat;Stairs;Stand;Transfers;Locomotion Level;Sit    Examination-Participation Restrictions  Community Activity  Stability/Clinical Decision Making  Evolving/Moderate complexity    Rehab Potential  Fair   MMSE score 16/30; good family support   PT Frequency  2x / week    PT Duration  8 weeks   per recert 07/03/621, plus recert visit (total POC = 9 weeks)   PT Treatment/Interventions  ADLs/Self Care Home Management;Gait training;DME Instruction;Neuromuscular re-education;Balance training;Therapeutic exercise;Therapeutic activities;Functional mobility training;Patient/family education    PT Next Visit Plan  continue to work on standing/gait exercises to improve foot clearance-pt did well in parallel bars    Consulted and Agree with Plan of Care  Patient;Family member/caregiver    Family Member Consulted  Husband       Patient will benefit from skilled therapeutic intervention in order to improve the following deficits and impairments:  Abnormal gait, Difficulty walking, Decreased safety awareness, Decreased balance, Decreased mobility, Decreased strength  Visit Diagnosis: Unsteadiness on feet  Other abnormalities of gait and mobility  Muscle weakness (generalized)     Problem List Patient Active Problem List   Diagnosis Date Noted  . Fluency disorder associated  with underlying disease 02/08/2018  . Cognitive and neurobehavioral dysfunction 02/08/2018  . 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 Arthritis of carpometacarpal [facility]) joint of left thumb 01/24/2018  . Ankle fracture, left 09/03/2016  . Hand dysfunction 03/11/2016  . Symptomatic varicose veins, bilateral 03/11/2016  . Numbness of left thumb 03/11/2016  . Elevated BP 07/27/2014  . Bunion of left foot 07/26/2014  . Abnormality of gait 07/26/2014  . Cough 11/10/2013  . Post-nasal drainage 11/10/2013  . Abnormal urine odor 11/10/2013  . Visit for preventive health examination 07/28/2013  . Right anterior knee pain 07/22/2012  . Medicare annual wellness visit, subsequent 07/25/2011  . ANXIETY, SITUATIONAL 09/25/2009  . ARTHRITIS 09/25/2009  . Vitamin D deficiency 09/27/2008  . Hyperlipidemia 09/17/2008  . Elevated blood pressure reading without diagnosis of hypertension 09/17/2008  . COLONIC POLYPS, HX OF 09/17/2008  . PERSONAL HISTORY DISEASES SKIN&SUBCUT TISSUE 09/17/2008    Narda Bonds, PTA [place] 05/12/19 1:18 PM Phone: [phone] Fax: [facility] [address] [place], [place], [address] Phone: [phone]   Fax:  [phone]  Name: Pamela Alvarez MRN: [id number] Date of Birth: 12-28-1940          10th Visit Progress Note  Covering visits 21-30, Dates 02/16/2019-05/12/2019  Pt had absence from PT for multiple weeks due to death in the family; upon return 04/07/2019, objective measures taken, with noted decline in functional measures since pt was here in December for PT.  In working with PT for the past month, PT has been making recommendations for husband and assistance at home to help with carryover from PT sessions.  Objective measures 05/12/2019:  Berg Balance test:  28/56 (improved from 21/56).  Gait velocity 1.14 ft/sec (slowed from previous 1.2  ft/sec), TUG score 72 sec with rollator, 39 sec with HHA (sloweed from 32.57 sec).  Pt has met STG 2; STG 3 and 4 not met; remaining STGs and LTGs ongoing.  Pt's cognitive status fluctuates, with PT needing additional time and cues throughout session for optimal movement patterns.  Pt will benefit from additional skilled PT for exercises to help with balance and foot clearance for improved gait, with continued reinforcement to patient and husband for optimal carryover for home.  Will continue towards LTGs.  Mady Haagensen, PT 05/14/19 5:05 PM Phone: [phone] Fax: [phone]

## 2019-05-18 ENCOUNTER — Other Ambulatory Visit: Payer: Self-pay

## 2019-05-18 ENCOUNTER — Ambulatory Visit: Payer: Medicare PPO | Admitting: Physical Therapy

## 2019-05-18 ENCOUNTER — Encounter: Payer: Self-pay | Admitting: Physical Therapy

## 2019-05-18 DIAGNOSIS — R2689 Other abnormalities of gait and mobility: Secondary | ICD-10-CM

## 2019-05-18 DIAGNOSIS — M6281 Muscle weakness (generalized): Secondary | ICD-10-CM | POA: Diagnosis not present

## 2019-05-18 DIAGNOSIS — R2681 Unsteadiness on feet: Secondary | ICD-10-CM

## 2019-05-18 NOTE — Therapy (Signed)
[facility] [address] [place], [place], [address] Phone: [phone]   Fax:  [phone]  Physical Therapy Treatment  Patient Details  Name: Pamela Alvarez MRN: [id number] Date of Birth: Jul 22, 1940 Referring Provider (PT): Dohmeier, Asencion Partridge   Encounter Date: 05/18/2019  PT End of Session - 05/18/19 1259    Visit Number  31    Number of Visits  39   per recert 3/81/0175   Date for PT Re-Evaluation  07/06/19    Authorization Type  [employer]-will need PROGRESS NOTE at Visit 30-32    PT Start Time  1152    PT Stop Time  1232    PT Time Calculation (min)  40 min    Equipment Utilized During Treatment  Gait belt    Activity Tolerance  Patient tolerated treatment well;Other (comment)   pt not following commands quite as well today   Behavior During Therapy  WFL for tasks assessed/performed   Decreased safety awareness with transfers      Past Medical History:  Diagnosis Date  . Cyst 07-2008   Cyst on back x2 that were drained  . H/O echocardiogram 02-2000  . Hx of colonic polyps   . Hyperlipidemia   . Hypertension   . Varicose veins of both lower extremities     Past Surgical History:  Procedure Laterality Date  . POLYPECTOMY     Colon  . TONSILLECTOMY    . TUBAL LIGATION      There were no vitals filed for this visit.  Subjective Assessment - 05/18/19 1159    Subjective  Denies any falls or changes.  Worked with private pay therapist this morning about 40 minutes.    Patient is accompained by:  Family member    Patient Stated Goals  be more mobile outside the house; better walking    Currently in Pain?  No/denies         [facility] Adult PT Treatment/Exercise - 05/18/19 0001      Transfers   Transfers  Sit to Stand;Stand to Sit    Sit to Stand  5: Supervision;4: Min guard    Sit to Stand Details  Verbal cues for sequencing;Verbal cues for technique;Tactile cues for placement    Sit to Stand  Details (indicate cue type and reason)  continues with improved strength with sit<>stand but still needs cues for safety    Stand to Sit  5: Supervision;4: Min guard    Stand to Sit Details (indicate cue type and reason)  Verbal cues for sequencing;Verbal cues for technique    Stand to Sit Details  verbal cues to lock rollator and to reach back and to control descent      Ambulation/Gait   Ambulation/Gait  Yes    Ambulation/Gait Assistance  4: Min guard;4: Min assist    Ambulation/Gait Assistance Details  Cues to increase step length, especially on L side.  Assist to steer at times to avoid objects.  Decreased cadence and little increase despite cues.    Ambulation Distance (Feet)  110 Feet   x 2 and around gym between activities   Assistive device  Rollator    Gait Pattern  Step-through pattern;Decreased arm swing - right;Decreased arm swing - left;Decreased step length - right;Decreased step length - left;Shuffle;Decreased dorsiflexion - left;Decreased dorsiflexion - right    Ambulation Surface  Level;Indoor      Posture/Postural Control   Posture/Postural Control  Postural limitations    Postural Limitations  Forward head  Neuro Re-ed    Neuro Re-ed Details   In // bars-bil UE support with flat disc on floor and tapping disc x 15-20 reps each side.  Progressed to actualy stepping onto disc x 15-20 reps each side.  Then performed stepping to disc forward then to disc behind to simulate a forward<>backward step weight shift.  Pt needed max cues and manual facilitation for weight shift.  Pt then performed taps to 2" step x 15-20 reps each side with verbal cues.      Exercises   Exercises  Knee/Hip      Knee/Hip Exercises: Aerobic   Nustep  Workload 2 all 4 extremities x 10 minutes with no rest breaks needed today!  Steps per minute >70 at all times and only cues needed for full ROM at times.               PT Short Term Goals - 05/12/19 1312      PT SHORT TERM GOAL #1    Title  Pt will perform HEP with family supervision for improved transfers, balance, and gait.  05/05/2019 (may be delayed due to delay in scheduling)    Baseline  3/5-pt working with private duty PT in addition to Outpatient and trying to develop consistent plan for both.  Husband states pt resistive to performing HEP with his assistance    Time  4    Period  Weeks    Status  On-going      PT SHORT TERM GOAL #2   Title  Pt will improve Berg Balance score to at least 28/56 for decreased fall risk.    Baseline  28/56 on 05/12/19    Time  4    Period  Weeks    Status  Achieved      PT SHORT TERM GOAL #3   Title  Pt will improve TUG score to less than or equal to 25 seconds for decreased fall risk.    Baseline  79 seconds 1st attempt and 32 seconds second attempt on 05/12/19    Time  4    Period  Weeks    Status  Not Met      PT SHORT TERM GOAL #4   Title  Pt will improve gait velocity score to at least 1.5 ft/sec for decreased fall risk with gait.    Baseline  1.14 ft/sec on 05/12/19    Time  4    Period  Weeks    Status  Not Met      PT SHORT TERM GOAL #5   Title  Pt/husband will verbalize understanding of progression of walking program for home, for continued gait efficiency and safety.    Time  4    Period  Weeks    Status  On-going        PT Long Term Goals - 04/07/19 1214      PT LONG TERM GOAL #1   Title  Pt/husband will report improvement in bed mobility sequencing and decreased assistance with supine>sit to minimal assistance, for improved safety with bed mobility.  06/02/2019 (May be delayed due to delayed scheduling)    Baseline  04/07/2019:  Husband reports pt gets in on her own; total assist to get out of bed    Time  8    Period  Weeks    Status  On-going      PT LONG TERM GOAL #2   Title  Pt will improve gait velocity to at  least  1.8 ft/sec with rollator and supervision, for improved gait efficiency.    Baseline  1.73 ft/sec 01/25/2019 (no device); rollator 1.35  ft/sec; return to therapy 04/07/2019:  1.2 ft/sec    Time  8    Period  Weeks    Status  Revised      PT LONG TERM GOAL #3   Title  Pt will perform sit<>stand transfers from surfaces <18", simulating sofa, with verbal cues and minimal assistance or less, for improved transfers at home.    Time  8    Period  Weeks    Status  On-going      PT LONG TERM GOAL #4   Title  Pt will improve Berg score to at least 35/56 for decreased fall risk.    Baseline  --    Time  8    Period  Weeks    Status  Revised      PT LONG TERM GOAL #5   Title  Pt/husband will verbalize plan for continued optimal fitness upon d/c from PT.    Time  8    Period  Weeks    Status  On-going            Plan - 05/18/19 1300    Clinical Impression Statement  Skilled session continues to focus on gait, balance and strengthening.  Pt with improved ability to follow commands today.  Endurance/strength improving as pt was able to perform 10 minutes on Nustep today without a rest break.  Husband pleased with patients progress and feels like the combination of PT here and private duty has helped with pt's overall mobility.  Continue PT per POC.    Personal Factors and Comorbidities  Comorbidity 3+    Comorbidities  PMH includes ankle fracture, HTN, hyperlipidemia, recurrent falls    Examination-Activity Limitations  Squat;Stairs;Stand;Transfers;Locomotion Level;Sit    Examination-Participation Restrictions  Community Activity    Stability/Clinical Decision Making  Evolving/Moderate complexity    Rehab Potential  Fair   MMSE score 16/30; good family support   PT Frequency  2x / week    PT Duration  8 weeks   per recert 7/34/1937, plus recert visit (total POC = 9 weeks)   PT Treatment/Interventions  ADLs/Self Care Home Management;Gait training;DME Instruction;Neuromuscular re-education;Balance training;Therapeutic exercise;Therapeutic activities;Functional mobility training;Patient/family education    PT Next Visit  Plan  continue to work on standing/gait exercises to improve foot clearance-pt did well in parallel bars    Consulted and Agree with Plan of Care  Patient;Family member/caregiver    Family Member Consulted  Husband       Patient will benefit from skilled therapeutic intervention in order to improve the following deficits and impairments:  Abnormal gait, Difficulty walking, Decreased safety awareness, Decreased balance, Decreased mobility, Decreased strength  Visit Diagnosis: Unsteadiness on feet  Other abnormalities of gait and mobility  Muscle weakness (generalized)     Problem List Patient Active Problem List   Diagnosis Date Noted  . Fluency disorder associated with underlying disease 02/08/2018  . Cognitive and neurobehavioral dysfunction 02/08/2018  . 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 Arthritis of carpometacarpal [facility]) joint of left thumb 01/24/2018  . Ankle fracture, left 09/03/2016  . Hand dysfunction 03/11/2016  . Symptomatic varicose veins, bilateral 03/11/2016  . Numbness of left thumb 03/11/2016  . Elevated BP 07/27/2014  . Bunion of left foot 07/26/2014  . Abnormality of gait 07/26/2014  . Cough 11/10/2013  . Post-nasal drainage 11/10/2013  . Abnormal urine odor 11/10/2013  .  Visit for preventive health examination 07/28/2013  . Right anterior knee pain 07/22/2012  . Medicare annual wellness visit, subsequent 07/25/2011  . ANXIETY, SITUATIONAL 09/25/2009  . ARTHRITIS 09/25/2009  . Vitamin D deficiency 09/27/2008  . Hyperlipidemia 09/17/2008  . Elevated blood pressure reading without diagnosis of hypertension 09/17/2008  . COLONIC POLYPS, HX OF 09/17/2008  . PERSONAL HISTORY DISEASES SKIN&SUBCUT TISSUE 09/17/2008    Narda Bonds, [place] 05/18/19 1:03 PM Phone: [phone] Fax: [facility] [address] [place],  [place], [address] Phone: [phone]   Fax:  [phone]  Name: Pamela Alvarez MRN: [id number] Date of Birth: May 01, 1940

## 2019-05-22 ENCOUNTER — Other Ambulatory Visit: Payer: Self-pay

## 2019-05-22 ENCOUNTER — Ambulatory Visit: Payer: Medicare PPO | Admitting: Physical Therapy

## 2019-05-22 DIAGNOSIS — R2681 Unsteadiness on feet: Secondary | ICD-10-CM

## 2019-05-22 DIAGNOSIS — R2689 Other abnormalities of gait and mobility: Secondary | ICD-10-CM

## 2019-05-22 DIAGNOSIS — M6281 Muscle weakness (generalized): Secondary | ICD-10-CM | POA: Diagnosis not present

## 2019-05-22 NOTE — Therapy (Signed)
[facility] [address] [place], [place], [address] Phone: [phone]   Fax:  [phone]  Physical Therapy Treatment  Patient Details  Name: Pamela Alvarez MRN: [id number] Date of Birth: 01-17-1941 Referring Provider (PT): Dohmeier, Asencion Partridge   Encounter Date: 05/22/2019  PT End of Session - 05/22/19 1256    Visit Number  32    Number of Visits  39   per recert 6/81/1572   Date for PT Re-Evaluation  07/06/19    Authorization Type  [employer] Medicare-will need PROGRESS NOTE    PT Start Time  0849    PT Stop Time  0929    PT Time Calculation (min)  40 min    Equipment Utilized During Treatment  Gait belt    Activity Tolerance  Patient tolerated treatment well    Behavior During Therapy  [facility] for tasks assessed/performed   Decreased safety awareness with transfers      Past Medical History:  Diagnosis Date  . Cyst 07-2008   Cyst on back x2 that were drained  . H/O echocardiogram 02-2000  . Hx of colonic polyps   . Hyperlipidemia   . Hypertension   . Varicose veins of both lower extremities     Past Surgical History:  Procedure Laterality Date  . POLYPECTOMY     Colon  . TONSILLECTOMY    . TUBAL LIGATION      There were no vitals filed for this visit.  Subjective Assessment - 05/22/19 0851    Subjective  She's doing a little better, with getting up from the sofa.  She almost fell the other day with me walking beside her.    Patient is accompained by:  Family member    Patient Stated Goals  be more mobile outside the house; better walking    Currently in Pain?  No/denies                       [facility] Adult PT Treatment/Exercise - 05/22/19 0853      Transfers   Transfers  Sit to Stand;Stand to Sit    Sit to Stand  5: Supervision;4: Min guard    Sit to Stand Details  Verbal cues for sequencing;Verbal cues for technique;Tactile cues for placement    Sit to Stand Details (indicate cue type and  reason)  cues for hand placement    Stand to Sit  5: Supervision;4: Min guard    Stand to Sit Details (indicate cue type and reason)  Verbal cues for sequencing;Verbal cues for technique    Stand to Sit Details  VCs to lock rollator, to reach back to control descent      Ambulation/Gait   Ambulation/Gait  Yes    Ambulation/Gait Assistance  4: Min guard;4: Min assist    Ambulation/Gait Assistance Details  Cues to increase step length; slightly improved after work in parallel bars, but continues with decreased L step length/foot clearance    Ambulation Distance (Feet)  115 Feet   60, 40 ft    Assistive device  Rollator    Gait Pattern  Step-through pattern;Decreased arm swing - right;Decreased arm swing - left;Decreased step length - right;Decreased step length - left;Shuffle;Decreased dorsiflexion - left;Decreased dorsiflexion - right    Ambulation Surface  Level;Indoor      High Level Balance   High Level Balance Activities  Other (comment)    High Level Balance Comments  Forward/back walking in parallel bars, x 2 reps, cues  for increased step length.  For backward walking, PT provides tactile assistance to help pt take a larger step posteriorly on LLE.      Neuro Re-ed    Neuro Re-ed Details   In parallel bars:  BUE support with alternating tapping flat balance discs x 10 reps, then 2nd set with heel taps to balance discs, initial cues for increased swing of leg for increased step length.  With one foot propped on large pool noodle-forward/back rolling x 10 reps, 2 sets for improved SLS and improve hip flexibility for improved step length.      Knee/Hip Exercises: Aerobic   Nustep  Workload 2 all 4 extremities x 10 minutes with no rest breaks needed today!  Steps per minute >64-70 at all times and only cues needed for full ROM at times.      Knee/Hip Exercises: Seated   Other Seated Knee/Hip Exercises  Seated ankle dorsiflexion, plantarflexion 2 sets x 10 reps, verbal, visual, tactile cues  to initiate/complete               PT Short Term Goals - 05/12/19 1312      PT SHORT TERM GOAL #1   Title  Pt will perform HEP with family supervision for improved transfers, balance, and gait.  05/05/2019 (may be delayed due to delay in scheduling)    Baseline  3/5-pt working with private duty PT in addition to Outpatient and trying to develop consistent plan for both.  Husband states pt resistive to performing HEP with his assistance    Time  4    Period  Weeks    Status  On-going      PT SHORT TERM GOAL #2   Title  Pt will improve Berg Balance score to at least 28/56 for decreased fall risk.    Baseline  28/56 on 05/12/19    Time  4    Period  Weeks    Status  Achieved      PT SHORT TERM GOAL #3   Title  Pt will improve TUG score to less than or equal to 25 seconds for decreased fall risk.    Baseline  79 seconds 1st attempt and 32 seconds second attempt on 05/12/19    Time  4    Period  Weeks    Status  Not Met      PT SHORT TERM GOAL #4   Title  Pt will improve gait velocity score to at least 1.5 ft/sec for decreased fall risk with gait.    Baseline  1.14 ft/sec on 05/12/19    Time  4    Period  Weeks    Status  Not Met      PT SHORT TERM GOAL #5   Title  Pt/husband will verbalize understanding of progression of walking program for home, for continued gait efficiency and safety.    Time  4    Period  Weeks    Status  On-going        PT Long Term Goals - 04/07/19 1214      PT LONG TERM GOAL #1   Title  Pt/husband will report improvement in bed mobility sequencing and decreased assistance with supine>sit to minimal assistance, for improved safety with bed mobility.  06/02/2019 (May be delayed due to delayed scheduling)    Baseline  04/07/2019:  Husband reports pt gets in on her own; total assist to get out of bed    Time  8  Period  Weeks    Status  On-going      PT LONG TERM GOAL #2   Title  Pt will improve gait velocity to at  least 1.8 ft/sec with rollator  and supervision, for improved gait efficiency.    Baseline  1.73 ft/sec 01/25/2019 (no device); rollator 1.35 ft/sec; return to therapy 04/07/2019:  1.2 ft/sec    Time  8    Period  Weeks    Status  Revised      PT LONG TERM GOAL #3   Title  Pt will perform sit<>stand transfers from surfaces <18", simulating sofa, with verbal cues and minimal assistance or less, for improved transfers at home.    Time  8    Period  Weeks    Status  On-going      PT LONG TERM GOAL #4   Title  Pt will improve Berg score to at least 35/56 for decreased fall risk.    Baseline  --    Time  8    Period  Weeks    Status  Revised      PT LONG TERM GOAL #5   Title  Pt/husband will verbalize plan for continued optimal fitness upon d/c from PT.    Time  8    Period  Weeks    Status  On-going            Plan - 05/22/19 1257    Clinical Impression Statement  Focused skilled PT session today on aerobic warm-up exercise for lower extremity strengthening as well as standing balance activities in parallel bars and gait training.  With standing activities in parallel bars, she notes improved foot clearance and step length to targets.  However, decreased carryover noted with transition to rollator, with pt conitnueing to need cues for increased step length and foot celarance.  Pt will beneft from conitnued skilled PT to address balance, strength, and gait for improved functional mobility/decreased fall risk.    Personal Factors and Comorbidities  Comorbidity 3+    Comorbidities  PMH includes ankle fracture, HTN, hyperlipidemia, recurrent falls    Examination-Activity Limitations  Squat;Stairs;Stand;Transfers;Locomotion Level;Sit    Examination-Participation Restrictions  Community Activity    Stability/Clinical Decision Making  Evolving/Moderate complexity    Rehab Potential  Fair   MMSE score 16/30; good family support   PT Frequency  2x / week    PT Duration  8 weeks   per recert 2/54/2706, plus recert visit  (total POC = 9 weeks)   PT Treatment/Interventions  ADLs/Self Care Home Management;Gait training;DME Instruction;Neuromuscular re-education;Balance training;Therapeutic exercise;Therapeutic activities;Functional mobility training;Patient/family education    PT Next Visit Plan  continue to work on standing/gait exercises to improve foot clearance-pt did well in parallel bars with targets; revisit with pt/husband POC and ?additional appts beyond 1st week of April    Consulted and Agree with Plan of Care  Patient;Family member/caregiver    Family Member Consulted  Husband       Patient will benefit from skilled therapeutic intervention in order to improve the following deficits and impairments:  Abnormal gait, Difficulty walking, Decreased safety awareness, Decreased balance, Decreased mobility, Decreased strength  Visit Diagnosis: Unsteadiness on feet  Other abnormalities of gait and mobility  Muscle weakness (generalized)     Problem List Patient Active Problem List   Diagnosis Date Noted  . Fluency disorder associated with underlying disease 02/08/2018  . Cognitive and neurobehavioral dysfunction 02/08/2018  . 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 Arthritis of  carpometacarpal [facility]) joint of left thumb 01/24/2018  . Ankle fracture, left 09/03/2016  . Hand dysfunction 03/11/2016  . Symptomatic varicose veins, bilateral 03/11/2016  . Numbness of left thumb 03/11/2016  . Elevated BP 07/27/2014  . Bunion of left foot 07/26/2014  . Abnormality of gait 07/26/2014  . Cough 11/10/2013  . Post-nasal drainage 11/10/2013  . Abnormal urine odor 11/10/2013  . Visit for preventive health examination 07/28/2013  . Right anterior knee pain 07/22/2012  . Medicare annual wellness visit, subsequent 07/25/2011  . ANXIETY, SITUATIONAL 09/25/2009  . ARTHRITIS 09/25/2009  . Vitamin D deficiency 09/27/2008  . Hyperlipidemia 09/17/2008  . Elevated blood pressure reading without diagnosis of  hypertension 09/17/2008  . COLONIC POLYPS, HX OF 09/17/2008  . PERSONAL HISTORY DISEASES SKIN&SUBCUT TISSUE 09/17/2008    Analis Distler W. 05/22/2019, 1:03 PM Frazier Butt., PT  [place] [facility] [address] [place], [place], [address] Phone: [phone]   Fax:  [phone]  Name: Pamela Alvarez MRN: [id number] Date of Birth: [birth year]

## 2019-05-24 ENCOUNTER — Other Ambulatory Visit: Payer: Self-pay

## 2019-05-24 ENCOUNTER — Ambulatory Visit: Payer: Medicare PPO | Admitting: Physical Therapy

## 2019-05-24 VITALS — BP 141/88 | HR 93

## 2019-05-24 DIAGNOSIS — R2689 Other abnormalities of gait and mobility: Secondary | ICD-10-CM | POA: Diagnosis not present

## 2019-05-24 DIAGNOSIS — R2681 Unsteadiness on feet: Secondary | ICD-10-CM

## 2019-05-24 DIAGNOSIS — M6281 Muscle weakness (generalized): Secondary | ICD-10-CM | POA: Diagnosis not present

## 2019-05-24 NOTE — Therapy (Signed)
[facility] [address] [place], [place], [address] Phone: [phone]   Fax:  [phone]  Physical Therapy Treatment  Patient Details  Name: Pamela Alvarez MRN: [id number] Date of Birth: 1940-11-17 Referring Provider (PT): Dohmeier, Asencion Partridge   Encounter Date: 05/24/2019  PT End of Session - 05/24/19 1847    Visit Number  33    Number of Visits  39   per recert 8/54/6270   Date for PT Re-Evaluation  07/06/19    Authorization Type  [employer] Medicare-will need PROGRESS NOTE    PT Start Time  1614    PT Stop Time  1657    PT Time Calculation (min)  43 min    Equipment Utilized During Treatment  Gait belt    Activity Tolerance  Patient tolerated treatment well    Behavior During Therapy  [facility] for tasks assessed/performed   Decreased safety awareness with transfers      Past Medical History:  Diagnosis Date  . Cyst 07-2008   Cyst on back x2 that were drained  . H/O echocardiogram 02-2000  . Hx of colonic polyps   . Hyperlipidemia   . Hypertension   . Varicose veins of both lower extremities     Past Surgical History:  Procedure Laterality Date  . POLYPECTOMY     Colon  . TONSILLECTOMY    . TUBAL LIGATION      Vitals:   05/24/19 1621  BP: (!) 141/88  Pulse: 93    Subjective Assessment - 05/24/19 1618    Subjective  Per husband:  she's a little foggy today.  More so than usual.    Patient is accompained by:  Family member    Limitations  Standing    Patient Stated Goals  be more mobile outside the house; better walking                       [facility] Adult PT Treatment/Exercise - 05/24/19 0001      Transfers   Transfers  Sit to Stand;Stand to Sit    Sit to Stand  5: Supervision;4: Min guard    Sit to Stand Details  Verbal cues for sequencing;Verbal cues for technique;Tactile cues for placement    Stand to Sit  5: Supervision;4: Min guard    Stand to Sit Details (indicate cue type and  reason)  Verbal cues for sequencing;Verbal cues for technique      Ambulation/Gait   Ambulation/Gait  Yes    Ambulation/Gait Assistance  4: Min guard;4: Min assist    Ambulation/Gait Assistance Details  Cues to increase step length; slightly improved after work in parallel bars    Ambulation Distance (Feet)  60 Feet   80 ft x 2   Assistive device  Rollator    Gait Pattern  Step-through pattern;Decreased arm swing - right;Decreased arm swing - left;Decreased step length - right;Decreased step length - left;Shuffle;Decreased dorsiflexion - left;Decreased dorsiflexion - right    Ambulation Surface  Level;Indoor      High Level Balance   High Level Balance Activities  Other (comment)    High Level Balance Comments  Forward/back walking in parallel bars, attempted x 1 rep today in parallel bars, with pt taking very small steps in posterior direction with BUE support.  PT provides tactile assistance to help pt take a larger step posteriorly on LLE.      Self-Care   Self-Care  Other Self-Care Comments    Other Self-Care  Comments   Discussed POC in regards to husband's questions from last visit.  Based on this POC, she has one additional week, but visits frequency has not fully been met, with visits sporadic early in the POC due to scheduling availabilty.  Discussed likely seeing pt for 4 additional weeks (after checking goals next week/recert likely) with plans to transition towards exercises managed at home with husband/caregiver assistance after that time (based on future progress of patient in therapy).      Neuro Re-ed    Neuro Re-ed Details   In parallel bars:  BUE support with alternating tapping flat balance discs x 10 reps, then 2nd set with heel taps to balance discs, initial cues for increased swing of leg for increased step length.  With one foot propped on large pool noodle-forward/back rolling x 10 reps, 2 sets for improved SLS and improve hip flexibility for improved step length.   Alternating step taps to 2" block, then stepping over 2" block, for improved foot clearance, 10 reps each with cues to increase step length at times for better clearance.  Rolling ball forward/back x 10 reps with UE support for improved SLS.  Attempted kicking bean bags for improved foot clearance, but pt slides bean bags instead and PT has pt to stop due to decreased safety.             PT Education - 05/24/19 1846    Education Details  Discused POC and scheduling    Person(s) Educated  Patient;Spouse    Methods  Explanation       PT Short Term Goals - 05/12/19 1312      PT SHORT TERM GOAL #1   Title  Pt will perform HEP with family supervision for improved transfers, balance, and gait.  05/05/2019 (may be delayed due to delay in scheduling)    Baseline  3/5-pt working with private duty PT in addition to Outpatient and trying to develop consistent plan for both.  Husband states pt resistive to performing HEP with his assistance    Time  4    Period  Weeks    Status  On-going      PT SHORT TERM GOAL #2   Title  Pt will improve Berg Balance score to at least 28/56 for decreased fall risk.    Baseline  28/56 on 05/12/19    Time  4    Period  Weeks    Status  Achieved      PT SHORT TERM GOAL #3   Title  Pt will improve TUG score to less than or equal to 25 seconds for decreased fall risk.    Baseline  79 seconds 1st attempt and 32 seconds second attempt on 05/12/19    Time  4    Period  Weeks    Status  Not Met      PT SHORT TERM GOAL #4   Title  Pt will improve gait velocity score to at least 1.5 ft/sec for decreased fall risk with gait.    Baseline  1.14 ft/sec on 05/12/19    Time  4    Period  Weeks    Status  Not Met      PT SHORT TERM GOAL #5   Title  Pt/husband will verbalize understanding of progression of walking program for home, for continued gait efficiency and safety.    Time  4    Period  Weeks    Status  On-going  PT Long Term Goals - 04/07/19 1214       PT LONG TERM GOAL #1   Title  Pt/husband will report improvement in bed mobility sequencing and decreased assistance with supine>sit to minimal assistance, for improved safety with bed mobility.  06/02/2019 (May be delayed due to delayed scheduling)    Baseline  04/07/2019:  Husband reports pt gets in on her own; total assist to get out of bed    Time  8    Period  Weeks    Status  On-going      PT LONG TERM GOAL #2   Title  Pt will improve gait velocity to at  least 1.8 ft/sec with rollator and supervision, for improved gait efficiency.    Baseline  1.73 ft/sec 01/25/2019 (no device); rollator 1.35 ft/sec; return to therapy 04/07/2019:  1.2 ft/sec    Time  8    Period  Weeks    Status  Revised      PT LONG TERM GOAL #3   Title  Pt will perform sit<>stand transfers from surfaces <18", simulating sofa, with verbal cues and minimal assistance or less, for improved transfers at home.    Time  8    Period  Weeks    Status  On-going      PT LONG TERM GOAL #4   Title  Pt will improve Berg score to at least 35/56 for decreased fall risk.    Baseline  --    Time  8    Period  Weeks    Status  Revised      PT LONG TERM GOAL #5   Title  Pt/husband will verbalize plan for continued optimal fitness upon d/c from PT.    Time  8    Period  Weeks    Status  On-going            Plan - 05/24/19 1847    Clinical Impression Statement  Husband concerned as pt a little "foggy" today, mroe so than usual.  Vitals assessed, and are WNL.  Pt reports feeling okay, but she does need more cues for locking/unlocking brakes with transfers and has more difficulty with directions of activities in parallel bars as compared to next time.  Discussed with husband trying to be consistent with 2x/wk POC (as initial visits were more spaced out due to clinic scheduling), over the next month-6 weeks, then likely plan for family/caregiver assistance in home to carry through with PT recommendations.    Personal  Factors and Comorbidities  Comorbidity 3+    Comorbidities  PMH includes ankle fracture, HTN, hyperlipidemia, recurrent falls    Examination-Activity Limitations  Squat;Stairs;Stand;Transfers;Locomotion Level;Sit    Examination-Participation Restrictions  Community Activity    Stability/Clinical Decision Making  Evolving/Moderate complexity    Rehab Potential  Fair   MMSE score 16/30; good family support   PT Frequency  2x / week    PT Duration  8 weeks   per recert 0/81/4481, plus recert visit (total POC = 9 weeks)   PT Treatment/Interventions  ADLs/Self Care Home Management;Gait training;DME Instruction;Neuromuscular re-education;Balance training;Therapeutic exercise;Therapeutic activities;Functional mobility training;Patient/family education    PT Next Visit Plan  continue to work on standing/gait exercises to improve foot clearance-pt did well in parallel bars with targets; check LTGs and will need to recert (discussed 2x/wk for 4 weeks in April); functional balance activities for home (kicks at walker?)    Consulted and Agree with Plan of Care  Patient;Family member/caregiver  Family Member Consulted  Husband       Patient will benefit from skilled therapeutic intervention in order to improve the following deficits and impairments:  Abnormal gait, Difficulty walking, Decreased safety awareness, Decreased balance, Decreased mobility, Decreased strength  Visit Diagnosis: Unsteadiness on feet  Other abnormalities of gait and mobility     Problem List Patient Active Problem List   Diagnosis Date Noted  . Fluency disorder associated with underlying disease 02/08/2018  . Cognitive and neurobehavioral dysfunction 02/08/2018  . 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 Arthritis of carpometacarpal [facility]) joint of left thumb 01/24/2018  . Ankle fracture, left 09/03/2016  . Hand dysfunction 03/11/2016  . Symptomatic varicose veins, bilateral 03/11/2016  . Numbness of left thumb  03/11/2016  . Elevated BP 07/27/2014  . Bunion of left foot 07/26/2014  . Abnormality of gait 07/26/2014  . Cough 11/10/2013  . Post-nasal drainage 11/10/2013  . Abnormal urine odor 11/10/2013  . Visit for preventive health examination 07/28/2013  . Right anterior knee pain 07/22/2012  . Medicare annual wellness visit, subsequent 07/25/2011  . ANXIETY, SITUATIONAL 09/25/2009  . ARTHRITIS 09/25/2009  . Vitamin D deficiency 09/27/2008  . Hyperlipidemia 09/17/2008  . Elevated blood pressure reading without diagnosis of hypertension 09/17/2008  . COLONIC POLYPS, HX OF 09/17/2008  . PERSONAL HISTORY DISEASES SKIN&SUBCUT TISSUE 09/17/2008    Sydne Krahl W. 05/24/2019, 6:51 PM Frazier Butt., PT  [facility] [address] [place], [place], [address] Phone: [phone]   Fax:  [phone]  Name: Pamela Alvarez MRN: [id number] Date of Birth: 1941/01/18

## 2019-05-31 ENCOUNTER — Ambulatory Visit: Payer: Medicare PPO | Admitting: Physical Therapy

## 2019-06-02 ENCOUNTER — Ambulatory Visit: Payer: Medicare PPO | Admitting: Physical Therapy

## 2019-06-02 ENCOUNTER — Other Ambulatory Visit: Payer: Self-pay

## 2019-06-02 DIAGNOSIS — R2689 Other abnormalities of gait and mobility: Secondary | ICD-10-CM

## 2019-06-02 DIAGNOSIS — R2681 Unsteadiness on feet: Secondary | ICD-10-CM

## 2019-06-02 DIAGNOSIS — M6281 Muscle weakness (generalized): Secondary | ICD-10-CM | POA: Diagnosis not present

## 2019-06-02 NOTE — Therapy (Signed)
[facility] [address] [place], [place], [address] Phone: [phone]   Fax:  [phone]  Physical Therapy Treatment  Patient Details  Name: Pamela Alvarez MRN: [id number] Date of Birth: 25-Oct-1940 Referring Provider (PT): Dohmeier, Asencion Partridge   Encounter Date: 06/02/2019  PT End of Session - 06/02/19 1937    Visit Number  34    Number of Visits  42   per recert 6/96/2952   Date for PT Re-Evaluation  08/01/19    Authorization Type  [employer]-will need PROGRESS NOTE    PT Start Time  1020    PT Stop Time  1100    PT Time Calculation (min)  40 min    Equipment Utilized During Treatment  Gait belt    Activity Tolerance  Patient tolerated treatment well    Behavior During Therapy  [facility] for tasks assessed/performed   Decreased safety awareness with transfers      Past Medical History:  Diagnosis Date  . Cyst 07-2008   Cyst on back x2 that were drained  . H/O echocardiogram 02-2000  . Hx of colonic polyps   . Hyperlipidemia   . Hypertension   . Varicose veins of both lower extremities     Past Surgical History:  Procedure Laterality Date  . POLYPECTOMY     Colon  . TONSILLECTOMY    . TUBAL LIGATION      There were no vitals filed for this visit.  Subjective Assessment - 06/02/19 1022    Subjective  No changes.  Son has been visiting this week.    Patient is accompained by:  Family member    Limitations  Standing    Patient Stated Goals  be more mobile outside the house; better walking    Currently in Pain?  No/denies                       [facility] Adult PT Treatment/Exercise - 06/02/19 0001      Ambulation/Gait   Ambulation/Gait  Yes    Ambulation Distance (Feet)  230 Feet    Gait Comments  170 ft in 2 minute walkt      Additional gait 100 ft x 2 with cues for posture, step length, using rollator, min guard assistance.  Therapeutic Activity: Worked on transfers, from mat:   Sit<>stand x 5 reps, with cues for hand placement, then cues for upright posture upon standing.  Then from 16" chair without UE support, pt performs sit<>stand transfers with therapist providing cues for scooting forward, forward lean and 3 count up to stand.  Pt performs 5 reps, needing extra time.     Neuro Re-education Standing balance activities at locked rollator:  -marching in place x 10 reps  -forward step taps, with heel leading, x 10 reps each, with good foot clearance noted.  Standing trunk rotation activities:  Beginning with head turns, then with body turns (with assistance and close guarding) for patient to tell what she sees as she turns, x 3 reps each.  Then used Boomwhackers with cone on mat beside patient, with trunk rotation activity to tap cones, then reaching across body activity with hand, then with boomwhackers, for improved trunk rotation.  Postural exercise standing using Boomwhackers to lean forward towards walker in front, then to reach up high, x 5 reps, then for upright posture, x 5 reps.  Therapist provides manual and tactile cues for LUE for improved reach and improved upright posture.  PT Short Term Goals - 05/12/19 1312      PT SHORT TERM GOAL #1   Title  Pt will perform HEP with family supervision for improved transfers, balance, and gait.  05/05/2019 (may be delayed due to delay in scheduling)    Baseline  3/5-pt working with private duty PT in addition to Outpatient and trying to develop consistent plan for both.  Husband states pt resistive to performing HEP with his assistance    Time  4    Period  Weeks    Status  On-going      PT SHORT TERM GOAL #2   Title  Pt will improve Berg Balance score to at least 28/56 for decreased fall risk.    Baseline  28/56 on 05/12/19    Time  4    Period  Weeks    Status  Achieved      PT SHORT TERM GOAL #3   Title  Pt will improve TUG score to less than or equal to 25 seconds for decreased fall risk.    Baseline   79 seconds 1st attempt and 32 seconds second attempt on 05/12/19    Time  4    Period  Weeks    Status  Not Met      PT SHORT TERM GOAL #4   Title  Pt will improve gait velocity score to at least 1.5 ft/sec for decreased fall risk with gait.    Baseline  1.14 ft/sec on 05/12/19    Time  4    Period  Weeks    Status  Not Met      PT SHORT TERM GOAL #5   Title  Pt/husband will verbalize understanding of progression of walking program for home, for continued gait efficiency and safety.    Time  4    Period  Weeks    Status  On-going        PT Long Term Goals - 06/02/19 1030      PT LONG TERM GOAL #1   Title  Pt/husband will report improvement in bed mobility sequencing and decreased assistance with supine>sit to minimal assistance, for improved safety with bed mobility.  06/02/2019 (May be delayed due to delayed scheduling)    Baseline  04/07/2019:  Husband reports pt gets in on her own; total assist to get out of bed; husband assists with getting out of bed    Time  8    Period  Weeks    Status  Not Met      PT LONG TERM GOAL #2   Title  Pt will improve gait velocity to at  least 1.8 ft/sec with rollator and supervision, for improved gait efficiency.    Baseline  1.73 ft/sec 01/25/2019 (no device); rollator 1.35 ft/sec; return to therapy 04/07/2019:  1.2 ft/sec; 1.2 ft/sec 06/02/2019    Time  8    Period  Weeks    Status  Not Met      PT LONG TERM GOAL #3   Title  Pt will perform sit<>stand transfers from surfaces <18", simulating sofa, with verbal cues and minimal assistance or less, for improved transfers at home.    Time  8    Period  Weeks    Status  Achieved      PT LONG TERM GOAL #4   Title  Pt will improve Berg score to at least 35/56 for decreased fall risk.    Time  8    Period  Weeks    Status  On-going      PT LONG TERM GOAL #5   Title  Pt/husband will verbalize plan for continued optimal fitness upon d/c from PT.    Baseline  Interested in assistance coming into  home for f/o with exercises    Time  8    Period  Weeks    Status  On-going            Plan - 06/02/19 1940    Clinical Impression Statement  This is last week of current POC; however, pt has not been seen consistently due to clinic schedules being full.  She has only been seen 3 visits since STG check.  LTG 1 not met for bed mobility, as husband reports he fully helps her due to bed being so high.  LTG 2 not met, with gait velocity 1.2 ft/sec (same as in January).  LTG 3 met for improved transfers from surfaces 18" or lower with minimal assistance (and husand reports improved transfers from sofa at home).  LTG 4 for Berg not assessed and ongoing, due to only being assessed 3 visits ago (worked on components of standing balance in session); LTG 5 ongoing.  Will plan to see pt consistently for short course of therapy to be consistent with therapy sessions with plan for updating exercises and balance/walking program for family/caregiver assistance in the home.    Personal Factors and Comorbidities  Comorbidity 3+    Comorbidities  PMH includes ankle fracture, HTN, hyperlipidemia, recurrent falls    Examination-Activity Limitations  Squat;Stairs;Stand;Transfers;Locomotion Level;Sit    Examination-Participation Restrictions  Community Activity    Stability/Clinical Decision Making  Evolving/Moderate complexity    Rehab Potential  Fair   MMSE score 16/30; good family support   PT Frequency  2x / week    PT Duration  8 weeks   per recert 6/96/2952, plus recert visit (total POC = 9 weeks)   PT Treatment/Interventions  ADLs/Self Care Home Management;Gait training;DME Instruction;Neuromuscular re-education;Balance training;Therapeutic exercise;Therapeutic activities;Functional mobility training;Patient/family education    PT Next Visit Plan  Recert completed 8/41/3244.  Try standing hip kicks and marching at locked rollator (is this something she would do at home?)  Work on standing balance and  updates to HEP for optimal carryover at home.  Revisit walking program for home.    Consulted and Agree with Plan of Care  Patient;Family member/caregiver    Family Member Consulted  Husband       Patient will benefit from skilled therapeutic intervention in order to improve the following deficits and impairments:  Abnormal gait, Difficulty walking, Decreased safety awareness, Decreased balance, Decreased mobility, Decreased strength  Visit Diagnosis: Other abnormalities of gait and mobility  Unsteadiness on feet     Problem List Patient Active Problem List   Diagnosis Date Noted  . Fluency disorder associated with underlying disease 02/08/2018  . Cognitive and neurobehavioral dysfunction 02/08/2018  . 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 Arthritis of carpometacarpal [facility]) joint of left thumb 01/24/2018  . Ankle fracture, left 09/03/2016  . Hand dysfunction 03/11/2016  . Symptomatic varicose veins, bilateral 03/11/2016  . Numbness of left thumb 03/11/2016  . Elevated BP 07/27/2014  . Bunion of left foot 07/26/2014  . Abnormality of gait 07/26/2014  . Cough 11/10/2013  . Post-nasal drainage 11/10/2013  . Abnormal urine odor 11/10/2013  . Visit for preventive health examination 07/28/2013  . Right anterior knee pain 07/22/2012  . Medicare annual wellness visit, subsequent 07/25/2011  .  ANXIETY, SITUATIONAL 09/25/2009  . ARTHRITIS 09/25/2009  . Vitamin D deficiency 09/27/2008  . Hyperlipidemia 09/17/2008  . Elevated blood pressure reading without diagnosis of hypertension 09/17/2008  . COLONIC POLYPS, HX OF 09/17/2008  . PERSONAL HISTORY DISEASES SKIN&SUBCUT TISSUE 09/17/2008    Berlynn Warsame W. 06/02/2019, 7:47 PM  Frazier Butt., PT   [facility] [address] [place], [place], [address] Phone: [phone]   Fax:  [phone]  Name: Mikiah Durall MRN: [id number] Date of Birth: 15-Jun-1940  New LTGs for  recert:  PT Long Term Goals - 06/02/19 1955      PT LONG TERM GOAL #1   Title  Pt/husband will verbalize and demonstrate understanding of final HEP to maintain functional gains made in therapy.  TARGET 07/07/2019    Time  4    Period  Weeks    Status  Revised      PT LONG TERM GOAL #2   Title  Pt/husband will verbalize understanding of progression of walking program for home, for continued gait efficiency and safety.    Time  4    Period  Weeks    Status  Revised      PT LONG TERM GOAL #3   Title  Pt will ambulate for at least 5 minutes without stopping to sit, using rollator, for improved endurance for gait at home.    Baseline  170 ft in 2 minutes    Time  4    Period  Weeks    Status  Revised      PT LONG TERM GOAL #4   Title  Pt will improve Berg score to at least 35/56 for decreased fall risk.    Baseline  28/56 05/12/2019    Time  4    Period  Weeks    Status  On-going      PT LONG TERM GOAL #5   Title  Pt/husband will verbalize plan for continued optimal fitness upon d/c from PT.    Baseline  Interested in assistance coming into home for f/o with exercises    Time  4    Period  Weeks    Status  On-going      Mady Haagensen, [place] 06/02/19 7:59 PM Phone: [phone] Fax: [phone]

## 2019-06-13 ENCOUNTER — Other Ambulatory Visit: Payer: Self-pay

## 2019-06-13 ENCOUNTER — Ambulatory Visit: Payer: Medicare PPO | Attending: Neurology | Admitting: Physical Therapy

## 2019-06-13 ENCOUNTER — Encounter: Payer: Self-pay | Admitting: Physical Therapy

## 2019-06-13 DIAGNOSIS — M6281 Muscle weakness (generalized): Secondary | ICD-10-CM | POA: Insufficient documentation

## 2019-06-13 DIAGNOSIS — R2681 Unsteadiness on feet: Secondary | ICD-10-CM | POA: Insufficient documentation

## 2019-06-13 DIAGNOSIS — R2689 Other abnormalities of gait and mobility: Secondary | ICD-10-CM | POA: Insufficient documentation

## 2019-06-13 NOTE — Therapy (Signed)
[facility] [address] [place], [place], [address] Phone: [phone]   Fax:  [phone]  Physical Therapy Treatment  Patient Details  Name: Pamela Alvarez MRN: [id number] Date of Birth: 02-05-1941 Referring Provider (PT): Dohmeier, Asencion Partridge   Encounter Date: 06/13/2019  PT End of Session - 06/13/19 1311    Visit Number  35    Number of Visits  42   per recert 0/56/9794   Date for PT Re-Evaluation  08/01/19    Authorization Type  [employer] Medicare-will need PROGRESS NOTE    PT Start Time  1232    PT Stop Time  1315    PT Time Calculation (min)  43 min    Equipment Utilized During Treatment  Gait belt    Activity Tolerance  Patient tolerated treatment well    Behavior During Therapy  [facility] for tasks assessed/performed   Decreased safety awareness with transfers      Past Medical History:  Diagnosis Date  . Cyst 07-2008   Cyst on back x2 that were drained  . H/O echocardiogram 02-2000  . Hx of colonic polyps   . Hyperlipidemia   . Hypertension   . Varicose veins of both lower extremities     Past Surgical History:  Procedure Laterality Date  . POLYPECTOMY     Colon  . TONSILLECTOMY    . TUBAL LIGATION      There were no vitals filed for this visit.  Subjective Assessment - 06/13/19 1305    Subjective  Husband reports her steps are getting very small.  Pt refuses to walk outside with husband with rollator as she does not want anyone to see her with the Rollator.  Still receiving private pay exercise sessions 2-3x/week.    Patient is accompained by:  Family member    Limitations  Standing    Patient Stated Goals  be more mobile outside the house; better walking    Currently in Pain?  No/denies                       [facility] Adult PT Treatment/Exercise - 06/13/19 0001      Transfers   Transfers  Sit to Stand;Stand to Sit    Sit to Stand  4: Min guard;5: Supervision    Sit to Stand Details   Verbal cues for sequencing;Verbal cues for technique;Tactile cues for placement    Sit to Stand Details (indicate cue type and reason)  cues for hand placement, forward lean     Stand to Sit  5: Supervision;4: Min guard    Stand to Sit Details (indicate cue type and reason)  Verbal cues for sequencing;Verbal cues for technique    Stand to Sit Details  verbal cues to lock rollator, control descent, reach back    Number of Reps  10 reps    Comments  Had pt turn several times to sit on her rollator to rest after gait.  cont to have difficulty with turns and tends to try and sit too soon.  Very unsteady with turn.  With turning to sit, pt tends to turn too soon, needs assistance and cues to fully turn to sit.      Ambulation/Gait   Ambulation/Gait  Yes    Ambulation/Gait Assistance  4: Min assist;4: Min guard    Ambulation/Gait Assistance Details  assist at times to keep rollator from advancing too far.  verbal and tactile cues for step length and speed.  Ambulation Distance (Feet)  200 Feet   x 2, 110 x 4   Assistive device  Rollator    Gait Pattern  Step-through pattern;Decreased arm swing - right;Decreased arm swing - left;Decreased step length - right;Decreased step length - left;Shuffle;Decreased dorsiflexion - left;Decreased dorsiflexion - right    Ambulation Surface  Level;Indoor      Exercises   Exercises  Knee/Hip      Knee/Hip Exercises: Aerobic   Nustep  workload 4 all 4 extremities x 10 minutes with >50 steps per minute      Knee/Hip Exercises: Standing   Hip Flexion  Stengthening;Both;15 reps;Knee bent               PT Short Term Goals - 05/12/19 1312      PT SHORT TERM GOAL #1   Title  Pt will perform HEP with family supervision for improved transfers, balance, and gait.  05/05/2019 (may be delayed due to delay in scheduling)    Baseline  3/5-pt working with private duty PT in addition to Outpatient and trying to develop consistent plan for both.  Husband states pt  resistive to performing HEP with his assistance    Time  4    Period  Weeks    Status  On-going      PT SHORT TERM GOAL #2   Title  Pt will improve Berg Balance score to at least 28/56 for decreased fall risk.    Baseline  28/56 on 05/12/19    Time  4    Period  Weeks    Status  Achieved      PT SHORT TERM GOAL #3   Title  Pt will improve TUG score to less than or equal to 25 seconds for decreased fall risk.    Baseline  79 seconds 1st attempt and 32 seconds second attempt on 05/12/19    Time  4    Period  Weeks    Status  Not Met      PT SHORT TERM GOAL #4   Title  Pt will improve gait velocity score to at least 1.5 ft/sec for decreased fall risk with gait.    Baseline  1.14 ft/sec on 05/12/19    Time  4    Period  Weeks    Status  Not Met      PT SHORT TERM GOAL #5   Title  Pt/husband will verbalize understanding of progression of walking program for home, for continued gait efficiency and safety.    Time  4    Period  Weeks    Status  On-going        PT Long Term Goals - 06/02/19 1955      PT LONG TERM GOAL #1   Title  Pt/husband will verbalize and demonstrate understanding of final HEP to maintain functional gains made in therapy.  TARGET 07/07/2019    Time  4    Period  Weeks    Status  Revised      PT LONG TERM GOAL #2   Title  Pt/husband will verbalize understanding of progression of walking program for home, for continued gait efficiency and safety.    Time  4    Period  Weeks    Status  Revised      PT LONG TERM GOAL #3   Title  Pt will ambulate for at least 5 minutes without stopping to sit, using rollator, for improved endurance for gait at home.    Baseline  170 ft in 2 minutes    Time  4    Period  Weeks    Status  Revised      PT LONG TERM GOAL #4   Title  Pt will improve Berg score to at least 35/56 for decreased fall risk.    Baseline  28/56 05/12/2019    Time  4    Period  Weeks    Status  On-going      PT LONG TERM GOAL #5   Title  Pt/husband  will verbalize plan for continued optimal fitness upon d/c from PT.    Baseline  Interested in assistance coming into home for f/o with exercises    Time  4    Period  Weeks    Status  On-going            Plan - 06/13/19 1312    Clinical Impression Statement  Pt with decline in gait cadence and with shortened steps/gait.  Husband reports pt refusing to walk at home with him.  Continue PT per POC.    Personal Factors and Comorbidities  Comorbidity 3+    Comorbidities  PMH includes ankle fracture, HTN, hyperlipidemia, recurrent falls    Examination-Activity Limitations  Squat;Stairs;Stand;Transfers;Locomotion Level;Sit    Examination-Participation Restrictions  Community Activity    Stability/Clinical Decision Making  Evolving/Moderate complexity    Rehab Potential  Fair   MMSE score 16/30; good family support   PT Frequency  2x / week    PT Duration  4 weeks   per recert 5/63/8937   PT Treatment/Interventions  ADLs/Self Care Home Management;Gait training;DME Instruction;Neuromuscular re-education;Balance training;Therapeutic exercise;Therapeutic activities;Functional mobility training;Patient/family education    PT Next Visit Plan  Recert completed 3/42/8768.  Try standing hip kicks and marching at locked rollator (is this something she would do at home?)  Work on standing balance and updates to HEP for optimal carryover at home.  Revisit walking program for home.    Consulted and Agree with Plan of Care  Patient;Family member/caregiver    Family Member Consulted  Husband       Patient will benefit from skilled therapeutic intervention in order to improve the following deficits and impairments:  Abnormal gait, Difficulty walking, Decreased safety awareness, Decreased balance, Decreased mobility, Decreased strength  Visit Diagnosis: Other abnormalities of gait and mobility  Unsteadiness on feet  Muscle weakness (generalized)     Problem List Patient Active Problem List    Diagnosis Date Noted  . Fluency disorder associated with underlying disease 02/08/2018  . Cognitive and neurobehavioral dysfunction 02/08/2018  . 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 Arthritis of carpometacarpal [facility]) joint of left thumb 01/24/2018  . Ankle fracture, left 09/03/2016  . Hand dysfunction 03/11/2016  . Symptomatic varicose veins, bilateral 03/11/2016  . Numbness of left thumb 03/11/2016  . Elevated BP 07/27/2014  . Bunion of left foot 07/26/2014  . Abnormality of gait 07/26/2014  . Cough 11/10/2013  . Post-nasal drainage 11/10/2013  . Abnormal urine odor 11/10/2013  . Visit for preventive health examination 07/28/2013  . Right anterior knee pain 07/22/2012  . Medicare annual wellness visit, subsequent 07/25/2011  . ANXIETY, SITUATIONAL 09/25/2009  . ARTHRITIS 09/25/2009  . Vitamin D deficiency 09/27/2008  . Hyperlipidemia 09/17/2008  . Elevated blood pressure reading without diagnosis of hypertension 09/17/2008  . COLONIC POLYPS, HX OF 09/17/2008  . PERSONAL HISTORY DISEASES SKIN&SUBCUT TISSUE 09/17/2008    Narda Bonds, PTA [place] 06/13/19 1:15 PM Phone: [phone] Fax:  [facility] [address] [place], [place], [address] Phone: [phone]   Fax:  [phone]  Name: Pamela Alvarez MRN: [id number] Date of Birth: March 24, 1940

## 2019-06-15 ENCOUNTER — Other Ambulatory Visit: Payer: Self-pay

## 2019-06-15 ENCOUNTER — Encounter: Payer: Self-pay | Admitting: Physical Therapy

## 2019-06-15 ENCOUNTER — Ambulatory Visit: Payer: Medicare PPO | Admitting: Physical Therapy

## 2019-06-15 DIAGNOSIS — M6281 Muscle weakness (generalized): Secondary | ICD-10-CM

## 2019-06-15 DIAGNOSIS — R2689 Other abnormalities of gait and mobility: Secondary | ICD-10-CM

## 2019-06-15 DIAGNOSIS — R2681 Unsteadiness on feet: Secondary | ICD-10-CM

## 2019-06-15 NOTE — Therapy (Signed)
[place] [address] [place], [place], [address] Phone: [phone]   Fax:  [phone]  Physical Therapy Treatment  Patient Details  Name: Pamela Alvarez MRN: [id number] Date of Birth: [birth year] Referring Provider (PT): Dohmeier, Asencion Partridge   Encounter Date: 06/15/2019  PT End of Session - 06/15/19 1304    Visit Number  36    Number of Visits  42   per recert 7/34/1937   Date for PT Re-Evaluation  08/01/19    Authorization Type  [employer]-will need PROGRESS NOTE    PT Start Time  1233    PT Stop Time  1315    PT Time Calculation (min)  42 min    Equipment Utilized During Treatment  Gait belt    Activity Tolerance  Patient tolerated treatment well    Behavior During Therapy  [facility] for tasks assessed/performed   Decreased safety awareness with transfers      Past Medical History:  Diagnosis Date  . Cyst 07-2008   Cyst on back x2 that were drained  . H/O echocardiogram 02-2000  . Hx of colonic polyps   . Hyperlipidemia   . Hypertension   . Varicose veins of both lower extremities     Past Surgical History:  Procedure Laterality Date  . POLYPECTOMY     Colon  . TONSILLECTOMY    . TUBAL LIGATION      There were no vitals filed for this visit.  Subjective Assessment - 06/15/19 1236    Subjective  No changes.  Had private session this morning.  Continues with decreased step length.    Patient is accompained by:  Family member    Limitations  Standing    Patient Stated Goals  be more mobile outside the house; better walking    Currently in Pain?  No/denies           [facility] Adult PT Treatment/Exercise - 06/15/19 0001      Transfers   Transfers  Sit to Stand;Stand to Sit    Sit to Stand  4: Min guard;5: Supervision    Sit to Stand Details  Verbal cues for sequencing;Verbal cues for technique;Tactile cues for placement    Stand to Sit  4: Min guard;5: Supervision    Stand to Sit Details (indicate  cue type and reason)  Verbal cues for sequencing;Verbal cues for technique    Number of Reps  10 reps      Ambulation/Gait   Ambulation/Gait  Yes    Ambulation/Gait Assistance  4: Min assist;4: Min guard    Ambulation/Gait Assistance Details  cues to stay close to rollator and for increased step length on the L.      Ambulation Distance (Feet)  110 Feet   x 4   Assistive device  Rollator    Gait Pattern  Step-through pattern;Decreased arm swing - right;Decreased arm swing - left;Decreased step length - right;Decreased step length - left;Shuffle;Decreased dorsiflexion - left;Decreased dorsiflexion - right    Ambulation Surface  Level;Indoor      Posture/Postural Control   Posture/Postural Control  Postural limitations    Postural Limitations  Forward head      High Level Balance   High Level Balance Activities  Other (comment)    High Level Balance Comments  Standing in // bars for step, taps to red beam, stepping on/off red beam, stepping over/back red beam, taps to 4" step and standing on red beam.  Pt requires bil UE assist for  all activities and min guard assist.  Taps to cones then tipping/upright cones with same assist.      Exercises   Exercises  Knee/Hip;Other Exercises    Other Exercises   seated on mat attempting tossing ball and kicking ball and dropping ball for coordination and L side attention.  Pt unable to process how to throw the ball and increased difficulty with kicking, dropping and catching.      Knee/Hip Exercises: Aerobic   Nustep  Workload 4 all 4 extremities x 8 minutes with one seated rest break.  steps per minute>55               PT Short Term Goals - 05/12/19 1312      PT SHORT TERM GOAL #1   Title  Pt will perform HEP with family supervision for improved transfers, balance, and gait.  05/05/2019 (may be delayed due to delay in scheduling)    Baseline  3/5-pt working with private duty PT in addition to Outpatient and trying to develop consistent  plan for both.  Husband states pt resistive to performing HEP with his assistance    Time  4    Period  Weeks    Status  On-going      PT SHORT TERM GOAL #2   Title  Pt will improve Berg Balance score to at least 28/56 for decreased fall risk.    Baseline  28/56 on 05/12/19    Time  4    Period  Weeks    Status  Achieved      PT SHORT TERM GOAL #3   Title  Pt will improve TUG score to less than or equal to 25 seconds for decreased fall risk.    Baseline  79 seconds 1st attempt and 32 seconds second attempt on 05/12/19    Time  4    Period  Weeks    Status  Not Met      PT SHORT TERM GOAL #4   Title  Pt will improve gait velocity score to at least 1.5 ft/sec for decreased fall risk with gait.    Baseline  1.14 ft/sec on 05/12/19    Time  4    Period  Weeks    Status  Not Met      PT SHORT TERM GOAL #5   Title  Pt/husband will verbalize understanding of progression of walking program for home, for continued gait efficiency and safety.    Time  4    Period  Weeks    Status  On-going        PT Long Term Goals - 06/02/19 1955      PT LONG TERM GOAL #1   Title  Pt/husband will verbalize and demonstrate understanding of final HEP to maintain functional gains made in therapy.  TARGET 07/07/2019    Time  4    Period  Weeks    Status  Revised      PT LONG TERM GOAL #2   Title  Pt/husband will verbalize understanding of progression of walking program for home, for continued gait efficiency and safety.    Time  4    Period  Weeks    Status  Revised      PT LONG TERM GOAL #3   Title  Pt will ambulate for at least 5 minutes without stopping to sit, using rollator, for improved endurance for gait at home.    Baseline  170 ft in 2 minutes  Time  4    Period  Weeks    Status  Revised      PT LONG TERM GOAL #4   Title  Pt will improve Berg score to at least 35/56 for decreased fall risk.    Baseline  28/56 05/12/2019    Time  4    Period  Weeks    Status  On-going      PT LONG  TERM GOAL #5   Title  Pt/husband will verbalize plan for continued optimal fitness upon d/c from PT.    Baseline  Interested in assistance coming into home for f/o with exercises    Time  4    Period  Weeks    Status  On-going            Plan - 06/15/19 1304    Clinical Impression Statement  Skilled session focused on strength and balance.  Pt with improvement in following directions today.  Continues to need mod-max cues for safety.  Continue PT per POC.    Personal Factors and Comorbidities  Comorbidity 3+    Comorbidities  PMH includes ankle fracture, HTN, hyperlipidemia, recurrent falls    Examination-Activity Limitations  Squat;Stairs;Stand;Transfers;Locomotion Level;Sit    Examination-Participation Restrictions  Community Activity    Stability/Clinical Decision Making  Evolving/Moderate complexity    Rehab Potential  Fair   MMSE score 16/30; good family support   PT Frequency  2x / week    PT Duration  4 weeks   per recert 5/92/9244   PT Treatment/Interventions  ADLs/Self Care Home Management;Gait training;DME Instruction;Neuromuscular re-education;Balance training;Therapeutic exercise;Therapeutic activities;Functional mobility training;Patient/family education    PT Next Visit Plan  Work on standing balance.  Revisit walking program for home (pt has been hesitant to walk at home with husband).Geanie Cooley and Agree with Plan of Care  Patient;Family member/caregiver    Family Member Consulted  Husband       Patient will benefit from skilled therapeutic intervention in order to improve the following deficits and impairments:  Abnormal gait, Difficulty walking, Decreased safety awareness, Decreased balance, Decreased mobility, Decreased strength  Visit Diagnosis: Other abnormalities of gait and mobility  Unsteadiness on feet  Muscle weakness (generalized)     Problem List Patient Active Problem List   Diagnosis Date Noted  . Fluency disorder associated with  underlying disease 02/08/2018  . Cognitive and neurobehavioral dysfunction 02/08/2018  . 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 Arthritis of carpometacarpal [facility]) joint of left thumb 01/24/2018  . Ankle fracture, left 09/03/2016  . Hand dysfunction 03/11/2016  . Symptomatic varicose veins, bilateral 03/11/2016  . Numbness of left thumb 03/11/2016  . Elevated BP 07/27/2014  . Bunion of left foot 07/26/2014  . Abnormality of gait 07/26/2014  . Cough 11/10/2013  . Post-nasal drainage 11/10/2013  . Abnormal urine odor 11/10/2013  . Visit for preventive health examination 07/28/2013  . Right anterior knee pain 07/22/2012  . Medicare annual wellness visit, subsequent 07/25/2011  . ANXIETY, SITUATIONAL 09/25/2009  . ARTHRITIS 09/25/2009  . Vitamin D deficiency 09/27/2008  . Hyperlipidemia 09/17/2008  . Elevated blood pressure reading without diagnosis of hypertension 09/17/2008  . COLONIC POLYPS, HX OF 09/17/2008  . PERSONAL HISTORY DISEASES SKIN&SUBCUT TISSUE 09/17/2008    Narda Bonds, PTA [place] 06/15/19 2:21 PM Phone: [phone] Fax: [place] [address] [place], [place], [address] Phone: [phone]   Fax:  [phone]  Name: Pamela  Alvarez MRN: [id number] Date of Birth: 1940/03/20

## 2019-06-21 ENCOUNTER — Encounter: Payer: Self-pay | Admitting: Physical Therapy

## 2019-06-21 ENCOUNTER — Ambulatory Visit: Payer: Medicare PPO | Admitting: Physical Therapy

## 2019-06-21 ENCOUNTER — Other Ambulatory Visit: Payer: Self-pay

## 2019-06-21 DIAGNOSIS — R2689 Other abnormalities of gait and mobility: Secondary | ICD-10-CM | POA: Diagnosis not present

## 2019-06-21 DIAGNOSIS — M6281 Muscle weakness (generalized): Secondary | ICD-10-CM | POA: Diagnosis not present

## 2019-06-21 DIAGNOSIS — R2681 Unsteadiness on feet: Secondary | ICD-10-CM

## 2019-06-21 NOTE — Therapy (Signed)
[facility] [address] [place], [place], [address] Phone: [phone]   Fax:  [phone]  Physical Therapy Treatment  Patient Details  Name: Pamela Alvarez MRN: [id number] Date of Birth: 1940/06/11 Referring Provider (PT): Dohmeier, Asencion Partridge   Encounter Date: 06/21/2019  PT End of Session - 06/21/19 1115    Visit Number  37    Number of Visits  42   per recert 3/32/9518   Date for PT Re-Evaluation  08/01/19    Authorization Type  [employer] Medicare-will need PROGRESS NOTE    PT Start Time  1020    PT Stop Time  1100    PT Time Calculation (min)  40 min    Equipment Utilized During Treatment  Gait belt    Activity Tolerance  Patient tolerated treatment well    Behavior During Therapy  [facility] for tasks assessed/performed   Decreased safety awareness with transfers      Past Medical History:  Diagnosis Date  . Cyst 07-2008   Cyst on back x2 that were drained  . H/O echocardiogram 02-2000  . Hx of colonic polyps   . Hyperlipidemia   . Hypertension   . Varicose veins of both lower extremities     Past Surgical History:  Procedure Laterality Date  . POLYPECTOMY     Colon  . TONSILLECTOMY    . TUBAL LIGATION      There were no vitals filed for this visit.  Subjective Assessment - 06/21/19 1107    Subjective  Husband reports rough morning as pt's sister passed away this morning.  Still not walking much outside of home.    Patient is accompained by:  Family member    Limitations  Standing    Patient Stated Goals  be more mobile outside the house; better walking    Currently in Pain?  No/denies        [facility] Adult PT Treatment/Exercise - 06/21/19 0001      Transfers   Transfers  Sit to Stand;Stand to Sit    Sit to Stand  4: Min guard;5: Supervision    Sit to Stand Details  Verbal cues for sequencing;Verbal cues for technique;Tactile cues for placement    Stand to Sit  4: Min guard;5: Supervision    Stand to  Sit Details (indicate cue type and reason)  Verbal cues for sequencing;Verbal cues for technique    Number of Reps  Other reps (comment)   multiple times through out session for seated rest breaks   Comments  continues to need cues for safety and complete turns along with making sure her rollator is locked and hand position      Ambulation/Gait   Ambulation/Gait  Yes    Ambulation/Gait Assistance  4: Min assist;4: Min guard    Ambulation/Gait Assistance Details  cues for increased step length, avoiding objects at times and increased cadence    Ambulation Distance (Feet)  220 Feet   x 1, 110 x 2   Assistive device  Rollator    Gait Pattern  Step-through pattern;Decreased arm swing - right;Decreased arm swing - left;Decreased step length - right;Decreased step length - left;Shuffle;Decreased dorsiflexion - left;Decreased dorsiflexion - right    Ambulation Surface  Level;Indoor      Posture/Postural Control   Posture/Postural Control  Postural limitations    Postural Limitations  Forward head      High Level Balance   High Level Balance Activities  Other (comment)    High Level  Balance Comments  Standing in // bars for marching, forward<>backward walking, taps to cones, tipping/uprighting cones, taps to 2" beam, stepping across/back same beam.  Pt requiring 2 UE assist of bars for support and moderate cues for tasks.  Improved following of directions today during session.      Self-Care   Self-Care  Other Self-Care Comments    Other Self-Care Comments   Discussed importance of continued mobility with patient to stay as active as possible to do the things she enjoys doing with her husband.  Pt continues to be hesitant to use Rollator outside so does not do much walking outside of therapy.          PT Short Term Goals - 05/12/19 1312      PT SHORT TERM GOAL #1   Title  Pt will perform HEP with family supervision for improved transfers, balance, and gait.  05/05/2019 (may be delayed due to  delay in scheduling)    Baseline  3/5-pt working with private duty PT in addition to Outpatient and trying to develop consistent plan for both.  Husband states pt resistive to performing HEP with his assistance    Time  4    Period  Weeks    Status  On-going      PT SHORT TERM GOAL #2   Title  Pt will improve Berg Balance score to at least 28/56 for decreased fall risk.    Baseline  28/56 on 05/12/19    Time  4    Period  Weeks    Status  Achieved      PT SHORT TERM GOAL #3   Title  Pt will improve TUG score to less than or equal to 25 seconds for decreased fall risk.    Baseline  79 seconds 1st attempt and 32 seconds second attempt on 05/12/19    Time  4    Period  Weeks    Status  Not Met      PT SHORT TERM GOAL #4   Title  Pt will improve gait velocity score to at least 1.5 ft/sec for decreased fall risk with gait.    Baseline  1.14 ft/sec on 05/12/19    Time  4    Period  Weeks    Status  Not Met      PT SHORT TERM GOAL #5   Title  Pt/husband will verbalize understanding of progression of walking program for home, for continued gait efficiency and safety.    Time  4    Period  Weeks    Status  On-going        PT Long Term Goals - 06/02/19 1955      PT LONG TERM GOAL #1   Title  Pt/husband will verbalize and demonstrate understanding of final HEP to maintain functional gains made in therapy.  TARGET 07/07/2019    Time  4    Period  Weeks    Status  Revised      PT LONG TERM GOAL #2   Title  Pt/husband will verbalize understanding of progression of walking program for home, for continued gait efficiency and safety.    Time  4    Period  Weeks    Status  Revised      PT LONG TERM GOAL #3   Title  Pt will ambulate for at least 5 minutes without stopping to sit, using rollator, for improved endurance for gait at home.    Baseline  170 ft  in 2 minutes    Time  4    Period  Weeks    Status  Revised      PT LONG TERM GOAL #4   Title  Pt will improve Berg score to at  least 35/56 for decreased fall risk.    Baseline  28/56 05/12/2019    Time  4    Period  Weeks    Status  On-going      PT LONG TERM GOAL #5   Title  Pt/husband will verbalize plan for continued optimal fitness upon d/c from PT.    Baseline  Interested in assistance coming into home for f/o with exercises    Time  4    Period  Weeks    Status  On-going            Plan - 06/21/19 1115    Clinical Impression Statement  Skilled session focused on gait, strength and balance along with attention to L side.  Pt continues to be resistant to using Rollator at home and is not doing as much walking due to this fact.  Husband working on plans for continued fitness upon d/c from therapy.  Continue PT per POC.    Personal Factors and Comorbidities  Comorbidity 3+    Comorbidities  PMH includes ankle fracture, HTN, hyperlipidemia, recurrent falls    Examination-Activity Limitations  Squat;Stairs;Stand;Transfers;Locomotion Level;Sit    Examination-Participation Restrictions  Community Activity    Stability/Clinical Decision Making  Evolving/Moderate complexity    Rehab Potential  Fair   MMSE score 16/30; good family support   PT Frequency  2x / week    PT Duration  4 weeks   per recert 6/38/4536   PT Treatment/Interventions  ADLs/Self Care Home Management;Gait training;DME Instruction;Neuromuscular re-education;Balance training;Therapeutic exercise;Therapeutic activities;Functional mobility training;Patient/family education    PT Next Visit Plan  Work on standing balance. Nustep for endurance/strength.    Consulted and Agree with Plan of Care  Patient;Family member/caregiver    Family Member Consulted  Husband       Patient will benefit from skilled therapeutic intervention in order to improve the following deficits and impairments:  Abnormal gait, Difficulty walking, Decreased safety awareness, Decreased balance, Decreased mobility, Decreased strength  Visit Diagnosis: Other abnormalities  of gait and mobility  Unsteadiness on feet  Muscle weakness (generalized)     Problem List Patient Active Problem List   Diagnosis Date Noted  . Fluency disorder associated with underlying disease 02/08/2018  . Cognitive and neurobehavioral dysfunction 02/08/2018  . 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 Arthritis of carpometacarpal [facility]) joint of left thumb 01/24/2018  . Ankle fracture, left 09/03/2016  . Hand dysfunction 03/11/2016  . Symptomatic varicose veins, bilateral 03/11/2016  . Numbness of left thumb 03/11/2016  . Elevated BP 07/27/2014  . Bunion of left foot 07/26/2014  . Abnormality of gait 07/26/2014  . Cough 11/10/2013  . Post-nasal drainage 11/10/2013  . Abnormal urine odor 11/10/2013  . Visit for preventive health examination 07/28/2013  . Right anterior knee pain 07/22/2012  . Medicare annual wellness visit, subsequent 07/25/2011  . ANXIETY, SITUATIONAL 09/25/2009  . ARTHRITIS 09/25/2009  . Vitamin D deficiency 09/27/2008  . Hyperlipidemia 09/17/2008  . Elevated blood pressure reading without diagnosis of hypertension 09/17/2008  . COLONIC POLYPS, HX OF 09/17/2008  . PERSONAL HISTORY DISEASES SKIN&SUBCUT TISSUE 09/17/2008    Narda Bonds, [place] 06/21/19 11:18 AM Phone: [phone] Fax: [place]  [address] [place], [place], [address] Phone: [phone]   Fax:  [phone]  Name: Pamela Alvarez MRN: [id number] Date of Birth: Jun 24, 1940

## 2019-06-22 ENCOUNTER — Other Ambulatory Visit: Payer: Self-pay | Admitting: Internal Medicine

## 2019-06-26 ENCOUNTER — Ambulatory Visit: Payer: Medicare PPO | Admitting: Physical Therapy

## 2019-06-26 ENCOUNTER — Encounter: Payer: Self-pay | Admitting: Physical Therapy

## 2019-06-26 ENCOUNTER — Other Ambulatory Visit: Payer: Self-pay

## 2019-06-26 DIAGNOSIS — R2689 Other abnormalities of gait and mobility: Secondary | ICD-10-CM

## 2019-06-26 DIAGNOSIS — M6281 Muscle weakness (generalized): Secondary | ICD-10-CM

## 2019-06-26 DIAGNOSIS — R2681 Unsteadiness on feet: Secondary | ICD-10-CM

## 2019-06-26 NOTE — Therapy (Signed)
[facility] [address] [place], [place], [address] Phone: [phone]   Fax:  [phone]  Physical Therapy Treatment  Patient Details  Name: Pamela Alvarez MRN: [id number] Date of Birth: 1940-03-20 Referring Provider (PT): Dohmeier, Asencion Partridge   Encounter Date: 06/26/2019  PT End of Session - 06/26/19 1512    Visit Number  38    Number of Visits  42   per recert 4/94/4967   Date for PT Re-Evaluation  08/01/19    Authorization Type  [employer]-will need PROGRESS NOTE    PT Start Time  1018    PT Stop Time  1059    PT Time Calculation (min)  41 min    Equipment Utilized During Treatment  Gait belt    Activity Tolerance  Patient tolerated treatment well    Behavior During Therapy  [facility] for tasks assessed/performed   Decreased safety awareness with transfers      Past Medical History:  Diagnosis Date  . Cyst 07-2008   Cyst on back x2 that were drained  . H/O echocardiogram 02-2000  . Hx of colonic polyps   . Hyperlipidemia   . Hypertension   . Varicose veins of both lower extremities     Past Surgical History:  Procedure Laterality Date  . POLYPECTOMY     Colon  . TONSILLECTOMY    . TUBAL LIGATION      There were no vitals filed for this visit.  Subjective Assessment - 06/26/19 1021    Subjective  Husband reports that 2021 has just been tough-due to sister passing away, another family member going into hospice.  No falls.    Patient is accompained by:  Family member    Limitations  Standing    Patient Stated Goals  be more mobile outside the house; better walking    Currently in Pain?  No/denies                     Therapeutic Activity: Standing at locked rollator: Marching in place, cues for high knee marching, 2 sets x 10 reps Forward step taps, cues for heelstrike, 1 set x 10 reps Heel raises x 10 reps  In parallel bars to work on single limb stance: Alternating tapping the top  of cones, 10 reps; followed by knocking over cones, turning cones upright with feet assist, alternating legs x 5 reps Step forward-together, step back-together, R leg leading x 5 reps, then L leg leading x 5 reps.  Tactile cues provided for initiation, then pt takes big step.  [facility] [facility] Adult PT Treatment/Exercise - 06/26/19 1022      Transfers   Transfers  Sit to Stand;Stand to Sit    Sit to Stand  4: Min guard;5: Supervision    Sit to Stand Details  Verbal cues for sequencing;Verbal cues for technique;Tactile cues for placement    Stand to Sit  4: Min guard;5: Supervision    Stand to Sit Details (indicate cue type and reason)  Verbal cues for sequencing;Verbal cues for technique    Number of Reps  Other reps (comment)   Multiple times throughout session for seated rest breaks   Comments  continues to need cues for safety and complete turns along with making sure her rollator is locked and hand position      Ambulation/Gait   Ambulation/Gait  Yes    Ambulation/Gait Assistance  4: Min guard    Ambulation/Gait Assistance Details  Cues to increase step length;  pt is able to self-correct and bring rollator close to her, about 50% of the time .    Ambulation Distance (Feet)  220 Feet   200   Assistive device  Rollator    Gait Pattern  Step-through pattern;Decreased arm swing - right;Decreased arm swing - left;Decreased step length - right;Decreased step length - left;Shuffle;Decreased dorsiflexion - left;Decreased dorsiflexion - right    Ambulation Surface  Level;Indoor    Gait Comments  Slightly improved gait pattern after standing activities in parallel bars.  Pt's 220 ft (above) with rollator was 4 minutes of continuous walking prior to needing a rest break.               PT Short Term Goals - 05/12/19 1312      PT SHORT TERM GOAL #1   Title  Pt will perform HEP with family supervision for improved transfers, balance, and gait.  05/05/2019 (may be delayed due to delay in scheduling)     Baseline  3/5-pt working with private duty PT in addition to Outpatient and trying to develop consistent plan for both.  Husband states pt resistive to performing HEP with his assistance    Time  4    Period  Weeks    Status  On-going      PT SHORT TERM GOAL #2   Title  Pt will improve Berg Balance score to at least 28/56 for decreased fall risk.    Baseline  28/56 on 05/12/19    Time  4    Period  Weeks    Status  Achieved      PT SHORT TERM GOAL #3   Title  Pt will improve TUG score to less than or equal to 25 seconds for decreased fall risk.    Baseline  79 seconds 1st attempt and 32 seconds second attempt on 05/12/19    Time  4    Period  Weeks    Status  Not Met      PT SHORT TERM GOAL #4   Title  Pt will improve gait velocity score to at least 1.5 ft/sec for decreased fall risk with gait.    Baseline  1.14 ft/sec on 05/12/19    Time  4    Period  Weeks    Status  Not Met      PT SHORT TERM GOAL #5   Title  Pt/husband will verbalize understanding of progression of walking program for home, for continued gait efficiency and safety.    Time  4    Period  Weeks    Status  On-going        PT Long Term Goals - 06/02/19 1955      PT LONG TERM GOAL #1   Title  Pt/husband will verbalize and demonstrate understanding of final HEP to maintain functional gains made in therapy.  TARGET 07/07/2019    Time  4    Period  Weeks    Status  Revised      PT LONG TERM GOAL #2   Title  Pt/husband will verbalize understanding of progression of walking program for home, for continued gait efficiency and safety.    Time  4    Period  Weeks    Status  Revised      PT LONG TERM GOAL #3   Title  Pt will ambulate for at least 5 minutes without stopping to sit, using rollator, for improved endurance for gait at home.    Baseline  170 ft in 2 minutes    Time  4    Period  Weeks    Status  Revised      PT LONG TERM GOAL #4   Title  Pt will improve Berg score to at least 35/56 for decreased  fall risk.    Baseline  28/56 05/12/2019    Time  4    Period  Weeks    Status  On-going      PT LONG TERM GOAL #5   Title  Pt/husband will verbalize plan for continued optimal fitness upon d/c from PT.    Baseline  Interested in assistance coming into home for f/o with exercises    Time  4    Period  Weeks    Status  On-going            Plan - 06/26/19 1512    Clinical Impression Statement  Pt able to increase time of walking to 4 minutes prior to seated rest break (though she only ambulates 220 ft in 4 minutes, so gait continues to be at slow speed).  She does well with lifting LLE for SLS activities at locked rollator and in parallel bars, needing occasional seated rest breaks throughout session.  She is progressing towards LTGs, with anticipated d/c likely next week.    Personal Factors and Comorbidities  Comorbidity 3+    Comorbidities  PMH includes ankle fracture, HTN, hyperlipidemia, recurrent falls    Examination-Activity Limitations  Squat;Stairs;Stand;Transfers;Locomotion Level;Sit    Examination-Participation Restrictions  Community Activity    Stability/Clinical Decision Making  Evolving/Moderate complexity    Rehab Potential  Fair   MMSE score 16/30; good family support   PT Frequency  2x / week    PT Duration  4 weeks   per recert 3/41/9622   PT Treatment/Interventions  ADLs/Self Care Home Management;Gait training;DME Instruction;Neuromuscular re-education;Balance training;Therapeutic exercise;Therapeutic activities;Functional mobility training;Patient/family education    PT Next Visit Plan  Standing balance, any updates to HEP as able; plan to check goals and discharge next week.    Consulted and Agree with Plan of Care  Patient;Family member/caregiver    Family Member Consulted  Husband       Patient will benefit from skilled therapeutic intervention in order to improve the following deficits and impairments:  Abnormal gait, Difficulty walking, Decreased safety  awareness, Decreased balance, Decreased mobility, Decreased strength  Visit Diagnosis: Other abnormalities of gait and mobility  Unsteadiness on feet  Muscle weakness (generalized)     Problem List Patient Active Problem List   Diagnosis Date Noted  . Fluency disorder associated with underlying disease 02/08/2018  . Cognitive and neurobehavioral dysfunction 02/08/2018  . 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 Arthritis of carpometacarpal [facility]) joint of left thumb 01/24/2018  . Ankle fracture, left 09/03/2016  . Hand dysfunction 03/11/2016  . Symptomatic varicose veins, bilateral 03/11/2016  . Numbness of left thumb 03/11/2016  . Elevated BP 07/27/2014  . Bunion of left foot 07/26/2014  . Abnormality of gait 07/26/2014  . Cough 11/10/2013  . Post-nasal drainage 11/10/2013  . Abnormal urine odor 11/10/2013  . Visit for preventive health examination 07/28/2013  . Right anterior knee pain 07/22/2012  . Medicare annual wellness visit, subsequent 07/25/2011  . ANXIETY, SITUATIONAL 09/25/2009  . ARTHRITIS 09/25/2009  . Vitamin D deficiency 09/27/2008  . Hyperlipidemia 09/17/2008  . Elevated blood pressure reading without diagnosis of hypertension 09/17/2008  . COLONIC POLYPS, HX OF 09/17/2008  . PERSONAL HISTORY DISEASES SKIN&SUBCUT TISSUE 09/17/2008  Pamela Alvarez W. 06/26/2019, 3:15 PM  Frazier Butt., PT  [facility] [address] [place], [place], [address] Phone: [phone]   Fax:  [phone]  Name: Pamela Alvarez MRN: [id number] Date of Birth: 26-Aug-1940

## 2019-07-03 ENCOUNTER — Encounter: Payer: Self-pay | Admitting: Physical Therapy

## 2019-07-03 ENCOUNTER — Other Ambulatory Visit: Payer: Self-pay

## 2019-07-03 ENCOUNTER — Ambulatory Visit: Payer: Medicare PPO | Admitting: Physical Therapy

## 2019-07-03 DIAGNOSIS — R2689 Other abnormalities of gait and mobility: Secondary | ICD-10-CM | POA: Diagnosis not present

## 2019-07-03 DIAGNOSIS — R2681 Unsteadiness on feet: Secondary | ICD-10-CM | POA: Diagnosis not present

## 2019-07-03 DIAGNOSIS — M6281 Muscle weakness (generalized): Secondary | ICD-10-CM | POA: Diagnosis not present

## 2019-07-03 NOTE — Therapy (Signed)
[facility] [address] [place], [place], [address] Phone: [phone]   Fax:  [phone]  Physical Therapy Treatment/Discharge Summary  Patient Details  Name: Pamela Alvarez MRN: [id number] Date of Birth: 02/09/1941 Referring Provider (PT): Dohmeier, Asencion Partridge   Encounter Date: 07/03/2019  PT End of Session - 07/03/19 1510    Visit Number  39    Number of Visits  42   per recert 6/43/8381   Date for PT Re-Evaluation  08/01/19    Authorization Type  [employer] Medicare-will need PROGRESS NOTE    PT Start Time  1228    PT Stop Time  1311    PT Time Calculation (min)  43 min    Equipment Utilized During Treatment  Gait belt    Activity Tolerance  Patient tolerated treatment well    Behavior During Therapy  [facility] for tasks assessed/performed   Decreased safety awareness with transfers      Past Medical History:  Diagnosis Date  . Cyst 07-2008   Cyst on back x2 that were drained  . H/O echocardiogram 02-2000  . Hx of colonic polyps   . Hyperlipidemia   . Hypertension   . Varicose veins of both lower extremities     Past Surgical History:  Procedure Laterality Date  . POLYPECTOMY     Colon  . TONSILLECTOMY    . TUBAL LIGATION      There were no vitals filed for this visit.  Subjective Assessment - 07/03/19 1231    Subjective  Husband reports:  planning for outside assistance, 3-4 times per week, for strengthening and walking.  Other days, she is pretty sedentary.  Think we may wrap up today, as we have a funeral to go to on Friday.    Patient is accompained by:  Family member    Limitations  Standing    Patient Stated Goals  be more mobile outside the house; better walking    Currently in Pain?  No/denies                       [facility] Adult PT Treatment/Exercise - 07/03/19 0001      Transfers   Transfers  Sit to Stand;Stand to Sit    Sit to Stand  4: Min guard;5: Supervision    Sit to Stand  Details  Verbal cues for sequencing;Verbal cues for technique;Tactile cues for placement    Stand to Sit  4: Min guard;5: Supervision    Stand to Sit Details (indicate cue type and reason)  Verbal cues for sequencing;Verbal cues for technique    Comments  Cues for safety for full turns and for hand placement.  At beginning of session in lobby and in gym seated area, pt's husband demo good supervision, assistance for safe transfers.      Ambulation/Gait   Ambulation/Gait  Yes    Ambulation/Gait Assistance  4: Min guard    Ambulation Distance (Feet)  240 Feet   240   Assistive device  Rollator;1 person hand held assist   2nd bout of gait with HHA   Gait Pattern  Step-through pattern;Decreased arm swing - right;Decreased arm swing - left;Decreased step length - right;Decreased step length - left;Shuffle;Decreased dorsiflexion - left;Decreased dorsiflexion - right    Ambulation Surface  Level;Unlevel    Pre-Gait Activities  Standing at locked rollator prior to gait:  marching in place, x 10 reps, forward step taps x 10 reps each leg.  Then seated  heel/toe raises x 10 reps.    Gait Comments  Pt able to walk 4 minutes of continuous walking (240 ft) with rollator walker, then 5 minutes of walking (240 ft) with no device, HHA.      Berg Balance Test   Sit to Stand  Able to stand  independently using hands    Standing Unsupported  Able to stand 2 minutes with supervision    Sitting with Back Unsupported but Feet Supported on Floor or Stool  Able to sit safely and securely 2 minutes    Stand to Sit  Controls descent by using hands    Transfers  Able to transfer with verbal cueing and /or supervision    Standing Unsupported with Eyes Closed  Able to stand 10 seconds with supervision    Standing Ubsupported with Feet Together  Able to place feet together independently and stand for 1 minute with supervision    From Standing, Reach Forward with Outstretched Arm  Can reach forward >5 cm safely (2")     From Standing Position, Pick up Object from Floor  Unable to try/needs assist to keep balance    From Standing Position, Turn to Look Behind Over each Shoulder  Turn sideways only but maintains balance    Turn 360 Degrees  Needs assistance while turning    Standing Unsupported, Alternately Place Feet on Step/Stool  Able to complete >2 steps/needs minimal assist    Standing Unsupported, One Foot in Front  Needs help to step but can hold 15 seconds    Standing on One Leg  Unable to try or needs assist to prevent fall    Total Score  27      Self-Care   Self-Care  Other Self-Care Comments    Other Self-Care Comments   Discussed overall progress towards goals, POC and likely discharge this visit.  Discussed addition to her current program, to either walk at home with husband 4-5 minutes (2x) or use the recumbent bike x 10 minutes, on the days when the outside help doesn't come for more specific exercises and HEP work.             PT Education - 07/03/19 1509    Education Details  Additional activities for home; plans for d/c this visit; pt's conitnued fall risk per Berg score and need for use of rollator and assistance of husband for optimal safety.    Person(s) Educated  Patient;Spouse    Methods  Explanation;Handout    Comprehension  Verbalized understanding       PT Short Term Goals - 05/12/19 1312      PT SHORT TERM GOAL #1   Title  Pt will perform HEP with family supervision for improved transfers, balance, and gait.  05/05/2019 (may be delayed due to delay in scheduling)    Baseline  3/5-pt working with private duty PT in addition to Outpatient and trying to develop consistent plan for both.  Husband states pt resistive to performing HEP with his assistance    Time  4    Period  Weeks    Status  On-going      PT SHORT TERM GOAL #2   Title  Pt will improve Berg Balance score to at least 28/56 for decreased fall risk.    Baseline  28/56 on 05/12/19    Time  4    Period  Weeks     Status  Achieved      PT SHORT TERM GOAL #3  Title  Pt will improve TUG score to less than or equal to 25 seconds for decreased fall risk.    Baseline  79 seconds 1st attempt and 32 seconds second attempt on 05/12/19    Time  4    Period  Weeks    Status  Not Met      PT SHORT TERM GOAL #4   Title  Pt will improve gait velocity score to at least 1.5 ft/sec for decreased fall risk with gait.    Baseline  1.14 ft/sec on 05/12/19    Time  4    Period  Weeks    Status  Not Met      PT SHORT TERM GOAL #5   Title  Pt/husband will verbalize understanding of progression of walking program for home, for continued gait efficiency and safety.    Time  4    Period  Weeks    Status  On-going        PT Long Term Goals - 07/03/19 1235      PT LONG TERM GOAL #1   Title  Pt/husband will verbalize and demonstrate understanding of final HEP to maintain functional gains made in therapy.  TARGET 07/07/2019    Baseline  Verbalizes understanding    Time  4    Period  Weeks    Status  Achieved      PT LONG TERM GOAL #2   Title  Pt/husband will verbalize understanding of progression of walking program for home, for continued gait efficiency and safety.    Time  4    Period  Weeks    Status  Achieved      PT LONG TERM GOAL #3   Title  Pt will ambulate for at least 5 minutes without stopping to sit, using rollator, for improved endurance for gait at home.    Baseline  240 ft, 4 minutes, 07/03/2019 with rollator; 240 ft, 5 minutes with HHA    Time  4    Period  Weeks    Status  Partially Met      PT LONG TERM GOAL #4   Title  Pt will improve Berg score to at least 35/56 for decreased fall risk.    Baseline  28/56 05/12/2019; 27/56 07/03/2019    Time  4    Period  Weeks    Status  Not Met      PT LONG TERM GOAL #5   Title  Pt/husband will verbalize plan for continued optimal fitness upon d/c from PT.    Baseline  Has outside assistance lined up for 3-4 times per week at home to continue with  HEP    Time  4    Period  Weeks    Status  Achieved            Plan - 07/03/19 1513    Clinical Impression Statement  Assessed LTGs this visit, with pt meeting 3 of 5 LTGs.  She has improved gait endurance to 4 minutes with rollator, 5 minutes with HHA, partially meeting LTG 3.  Berg score is 27/56 (decreased from 28/56 at last check), so LTG 4 not met.  Pt remains at significant fall risk, and balance measures do not appear to be improving.  Husband is supportive and has hired assistance in the home to help with exercises and HEP 3-4 times per week.  Educated husband on walkng and exercise program using bike on days that outside assistance is not at home.  Pt is appropriate for d/c at this time.    Personal Factors and Comorbidities  Comorbidity 3+    Comorbidities  PMH includes ankle fracture, HTN, hyperlipidemia, recurrent falls    Examination-Activity Limitations  Squat;Stairs;Stand;Transfers;Locomotion Level;Sit    Examination-Participation Restrictions  Community Activity    Stability/Clinical Decision Making  Evolving/Moderate complexity    Rehab Potential  Fair   MMSE score 16/30; good family support   PT Frequency  2x / week    PT Duration  4 weeks   per recert 06/18/8784   PT Treatment/Interventions  ADLs/Self Care Home Management;Gait training;DME Instruction;Neuromuscular re-education;Balance training;Therapeutic exercise;Therapeutic activities;Functional mobility training;Patient/family education    PT Next Visit Plan  Discharge this visit.    Consulted and Agree with Plan of Care  Patient;Family member/caregiver    Family Member Consulted  Husband       Patient will benefit from skilled therapeutic intervention in order to improve the following deficits and impairments:  Abnormal gait, Difficulty walking, Decreased safety awareness, Decreased balance, Decreased mobility, Decreased strength  Visit Diagnosis: Other abnormalities of gait and mobility  Unsteadiness on  feet     Problem List Patient Active Problem List   Diagnosis Date Noted  . Fluency disorder associated with underlying disease 02/08/2018  . Cognitive and neurobehavioral dysfunction 02/08/2018  . 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 Arthritis of carpometacarpal [facility]) joint of left thumb 01/24/2018  . Ankle fracture, left 09/03/2016  . Hand dysfunction 03/11/2016  . Symptomatic varicose veins, bilateral 03/11/2016  . Numbness of left thumb 03/11/2016  . Elevated BP 07/27/2014  . Bunion of left foot 07/26/2014  . Abnormality of gait 07/26/2014  . Cough 11/10/2013  . Post-nasal drainage 11/10/2013  . Abnormal urine odor 11/10/2013  . Visit for preventive health examination 07/28/2013  . Right anterior knee pain 07/22/2012  . Medicare annual wellness visit, subsequent 07/25/2011  . ANXIETY, SITUATIONAL 09/25/2009  . ARTHRITIS 09/25/2009  . Vitamin D deficiency 09/27/2008  . Hyperlipidemia 09/17/2008  . Elevated blood pressure reading without diagnosis of hypertension 09/17/2008  . COLONIC POLYPS, HX OF 09/17/2008  . PERSONAL HISTORY DISEASES SKIN&SUBCUT TISSUE 09/17/2008    Don Giarrusso W. 07/03/2019, 3:17 PM  Frazier Butt., PT  [facility] [address] [place], [place], [address] Phone: [phone]   Fax:  [phone]  Name: Gailene Youkhana MRN: [id number] Date of Birth: 08/24/1940   PHYSICAL THERAPY DISCHARGE SUMMARY  Visits from Start of Care: 39 (Pt was seen and then was on hold due to family emergency out of town Dec-January until her return to therapy)  Current functional level related to goals / functional outcomes: PT Long Term Goals - 07/03/19 1235      PT LONG TERM GOAL #1   Title  Pt/husband will verbalize and demonstrate understanding of final HEP to maintain functional gains made in therapy.  TARGET 07/07/2019    Baseline  Verbalizes understanding    Time  4    Period  Weeks    Status   Achieved      PT LONG TERM GOAL #2   Title  Pt/husband will verbalize understanding of progression of walking program for home, for continued gait efficiency and safety.    Time  4    Period  Weeks    Status  Achieved      PT LONG TERM GOAL #3   Title  Pt will ambulate for at least 5 minutes without stopping to sit, using rollator, for improved endurance  for gait at home.    Baseline  240 ft, 4 minutes, 07/03/2019 with rollator; 240 ft, 5 minutes with HHA    Time  4    Period  Weeks    Status  Partially Met      PT LONG TERM GOAL #4   Title  Pt will improve Berg score to at least 35/56 for decreased fall risk.    Baseline  28/56 05/12/2019; 27/56 07/03/2019    Time  4    Period  Weeks    Status  Not Met      PT LONG TERM GOAL #5   Title  Pt/husband will verbalize plan for continued optimal fitness upon d/c from PT.    Baseline  Has outside assistance lined up for 3-4 times per week at home to continue with HEP    Time  4    Period  Weeks    Status  Achieved      Pt has met 3 of 5 LTGs.  Berg score 27/56 (indicative of increased fall risk)   Remaining deficits: Decreased balance, decreased strength, difficulty walking, decreased safety awareness   Education / Equipment: Pt and husband have been educated in HEP, fall prevention, safety with transfers and gait, progression of walking and use of recumbent bike at home.  Plan: Patient agrees to discharge.  Patient goals were partially met. Patient is being discharged due to                                                     ?????maximizing rehab potential at this time.     Mady Haagensen, PT 07/03/19 3:20 PM Phone: [phone] Fax: [phone]

## 2019-07-03 NOTE — Patient Instructions (Signed)
On the days that Pamela Alvarez is not there, try to add in some additional activities:  1)Riding the recumbent bike for 10 minutes  2)Walking in the house (hallway or a loop around the rooms), 1-2 times during the day.  You could time your walking, 4-5 minutes at a time.

## 2019-07-07 ENCOUNTER — Ambulatory Visit: Payer: Medicare PPO | Admitting: Physical Therapy

## 2019-08-29 ENCOUNTER — Ambulatory Visit: Payer: Self-pay | Admitting: Neurology

## 2019-10-12 ENCOUNTER — Ambulatory Visit: Payer: Medicare PPO | Admitting: Neurology

## 2019-10-12 ENCOUNTER — Encounter: Payer: Self-pay | Admitting: Neurology

## 2019-10-12 ENCOUNTER — Other Ambulatory Visit: Payer: Self-pay | Admitting: Neurology

## 2019-10-12 VITALS — BP 138/86 | HR 93 | Ht 62.0 in | Wt 148.0 lb

## 2019-10-12 DIAGNOSIS — R269 Unspecified abnormalities of gait and mobility: Secondary | ICD-10-CM

## 2019-10-12 DIAGNOSIS — R4189 Other symptoms and signs involving cognitive functions and awareness: Secondary | ICD-10-CM

## 2019-10-12 DIAGNOSIS — G9389 Other specified disorders of brain: Secondary | ICD-10-CM | POA: Diagnosis not present

## 2019-10-12 DIAGNOSIS — F09 Unspecified mental disorder due to known physiological condition: Secondary | ICD-10-CM

## 2019-10-12 DIAGNOSIS — G231 Progressive supranuclear ophthalmoplegia [Steele-Richardson-Olszewski]: Secondary | ICD-10-CM

## 2019-10-12 MED ORDER — CARBIDOPA-LEVODOPA 25-100 MG PO TABS: 1.0000 | ORAL_TABLET | Freq: Three times a day (TID) | ORAL | 2 refills | Status: DC

## 2019-10-12 NOTE — Progress Notes (Signed)
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 female patient, and seen here in a Rv for memory loss-  Progressing quickly over 30 month by history and in comparison to previous MMSE/ MOCA.  mini-mental status exam tof day ended in 9 points.  Severe dementia.  The couple visited their children and grandchildren in June , by road trip- 3 weeks. Staying in hotels /motels.  Her face is masked. She can hold small- talk. Now in a wheelchair. Left sided weakness, at home PT has noted increased rigidity, left arm clumsy and left Alvarez everted. .   Mr. Hunn reports that their children have had Covid 19 in [place] and in [place]. There is also family in [facility] that wanted to visit , but the [place] declined. The profile today Mini-Mental Status Examination in which the patient scored 15 points.  She presented with a slightly elevated blood pressure today to the office.  Since August she had completed physical therapy and speech therapy which has helped with some strengthening.  And prior to Christmas she had completed her physical therapy.  But after physical therapy sees she has also began again to have some ambulation difficulties.  It seems that we need an ongoing may be group exercise for her and I wonder if there could be a Zoom or WebEx video that would help to encourage her to do some of these exercises in the home environment and with some guidance and perhaps even with correction of posture or other input.   She is doing Pilates on zoom, but its very difficult for hr to follow instructions. Patient was seen here on 02-08-2018 in a referral from Dr. Fabian Alvarez for a "second opinion " in the work up for memory loss.  The patient reports having been stumped by the previously see neurologist in [place], who reportedly diagnosed her with Alzheimer's Dementia. 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 She has a [name] and reports she had some learning disability - not learning or memory related, but an inability to calculate.   Dr. Early Alvarez had always been acute aware of her learning abilities, but over the last couple of years there has been evidence of a declining cognitive ability.  The patient broke her ankle at the time and while she recovered from this supposedly only bony injury there were several changes, she felt always cold it was as if her enough thermostat had changed, she also noted and memory decline, may be a personality change, she had trouble retrieving words and her vocabulary became progressively restricted, her speech poor. She has ever since gait and balance impairment. She is in PT twice weekly.   Family history - mother had PD.father had CAD, died at 32 years old, cancer.  Social history- [name], no history of tobacco , ETOH- used to have one glass of wine with dinner, now 2-3 days a week.  No  recreatinonal drugs. 2 sons , aged 62 and 74.    RV 04-12-2018,  Plan: Eye-movement abnormalities now most like associated with the gliosis and encephalomalcia and not neurodegeneratve. Vascular dementia.  Slowed EEG,  Abnormal gliosis or encephalomalacia in MRI - could this be an AVM? Fall related head trauma? Carotid vaso-vascular embolism?   Need MRA brain, non contrast.  Need carotid doppler first.  PT, OT and ST to continue.  Rv in 2-3 month with me.   RV 10-27-2018, Rv  Due to the Coronavirus pandemia none of above tests were performed.  I explained again that the MRA is non contrast, no invasive.  New orders for ST, PT and OT will be needed. Her left hand is dominant and she has become very clumsy.   STUDY DATE: 10/29/2018 PATIENT NAME: Pamela Alvarez DOB: [birth year] MRN: [id number]  EXAM: MR angiogram of the intracranial arteries  ORDERING CLINICIAN: Porfirio Mylar Tadao Emig MD CLINICAL HISTORY: 79 year old woman with gait ataxia and acute  cognitive changes COMPARISON FILMS: None  TECHNIQUE: MR angiogram of the head was obtained utilizing 3D time of flight sequences from below the vertebrobasilar junction up to the intracranial vasculature without contrast.  Computerized reconstructions were obtained. CONTRAST: None IMAGING SITE: [facility], [address] [place], [place], [place]  FINDINGS: The imaged extracranial and intracranial portions of the internal carotid arteries appear normal. The middle cerebral and anterior cerebral arteries appear normal.   In the posterior circulation, the right vertebral artery is dominant.  The V4 segment of the left vertebral artery is not observed in the MIP reconstructions though some flow is noted on the source images.  Mild stenosis is noted within the proximal basilar artery.  There is a fetal origin of the left posterior cerebral artery.  Some luminal irregularity is noted in the P2 segment consistent with mild stenosis.  No aneurysms were identified.   IMPRESSION: This MR angiogram of the intracerebral arteries shows the following: 1.  Reduced flow is noted in the distal left vertebral artery.  This could be due to stenosis of the V4 segment or pre-cerebral stenosis more proximally..  If clinically indicated, consider a contrasted MR angiogram of the neck arteries to further evaluate. 2.  Mild stenosis of the proximal basilar artery.   3.  Mild stenosis of the left posterior cerebral artery which has a fetal origin (variant).     INTERPRETING PHYSICIAN:  Pamela A. Epimenio Foot, MD, [name], FAAN Certified in  Neuroimaging by American Society of Neuroimaging   [facility]  NEUROIMAGING REPORT   STUDY DATE: 03/15/18 PATIENT NAME: Pamela Alvarez DOB: 04-04-1940 MRN: [id number]  ORDERING CLINICIAN: Melvyn Novas, MD  CLINICAL HISTORY: 79 year old female with memory loss and confusion.  EXAM: MRI brain (without)  TECHNIQUE: MRI of the brain without  contrast was obtained utilizing 5 mm axial slices with T1, T2, T2 flair, SWI and diffusion weighted views.  T1 sagittal and T2 coronal views were obtained. CONTRAST: no (patient declined IV contrast) COMPARISON: none  IMAGING SITE: [facility] [address] (1.5 Tesla MRI)    FINDINGS:   Abnormal T2 FLAIR hyperintense signal in the right frontal, right parietal and right cerebellar regions, with encephalomalacia and gliosis.  However there also appears to be underlying heterogeneous lesions with surrounding vasogenic edema.  On SWI views, there is increased susceptibility within the rim of these regions as well indicating mineralization or hemosiderin deposition.  Considerations would include embolic chronic ischemic infarcts, although underlying neoplasm (metastases) cannot be excluded.   Elsewhere few punctate foci of nonspecific gliosis in the subcortical white matter.  No abnormal lesions are seen on diffusion-weighted views to suggest acute ischemia. The cortical sulci, fissures and cisterns are normal in size and appearance. Lateral, third and fourth ventricle are normal in size and appearance. No extra-axial fluid collections are seen. No evidence of mass effect or midline shift.    On sagittal  views the posterior fossa, pituitary gland and corpus callosum are unremarkable. No evidence of intracranial hemorrhage on SWI views. The orbits and their contents, paranasal sinuses and calvarium are unremarkable.  Intracranial flow voids are present.   IMPRESSION:  Abnormal MRI brain (without) demonstrating: - Right frontal, right parietal and right cerebellar encephalomalacia and gliosis.  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  Underlying neoplastic or vascular lesions cannot be excluded, and would recommend follow-up imaging with postcontrast views. - Mild chronic small vessel ischemic disease. - No acute findings.   INTERPRETING PHYSICIAN:  Suanne Marker, MD Certified in Neurology, Neurophysiology and Neuroimaging    Review of Systems: Out of a complete 14 system review, the patient complains of only the following symptoms, and all other reviewed systems are negative.  Patient had several falls, once hit the back of the head,  Had a bid goose egg- ED did not image her.  now right encephalomalcia may be related- her cognitive , coordination and speech declined after the fall.. .  Poverty of speech in a patient with a [name] degree , stiff gait, left-right confusion and left sided clumsiness- acalculia.  Left thumb sensoryloss, clumsy hand.  No Dysphagia.  Dysphonia. Aphasia.  No loss of smell or taste.    Her face is masked. She can hold small- talk. Now in a wheelchair. Left sided weakness, at home PT has noted increased rigidity, left arm clumsy and left Alvarez everted. .   Social History   Socioeconomic History  . Marital status: Married    Spouse name: [name]  . Number of children: 2  . Years of education: Not on file  . Highest education level: Doctorate  Occupational History  . Occupation: retired    Comment: Social research officer, government [name]  Tobacco Use  . Smoking status: Never Smoker  . Smokeless tobacco: Never Used  Substance and Sexual Activity  . Alcohol use: Yes    Alcohol/week: 7.0 standard drinks    Types: 7 Glasses of wine per week    Comment: one a day  . Drug use: Not Currently  . Sexual activity: Not on file  Other Topics Concern  . Not on file  Social History Narrative   Regular exercise-yes   Moved back to [place] from [place] of 2 cat   G3 P2    Exercises walking regu;arly    Retired  Social research officer, government   Drinks one cup of coffee  A day. In addition to walking QD she and her husband take a pilates class on Thursdays. She lives with her husband in a 2 story house, though the master bedroom in on the first level.       Social Determinants of Health   Financial Resource Strain:   . Difficulty of  Paying Living Expenses:   Food Insecurity:   . Worried About Programme researcher, broadcasting/film/video in the Last Year:   . Barista in the Last Year:   Transportation Needs:   . Freight forwarder (Medical):   Marland Kitchen Lack of Transportation (Non-Medical):   Physical Activity:   . Days of Exercise per Week:   . Minutes of Exercise per Session:   Stress:   . Feeling of Stress :   Social Connections:   . Frequency of Communication with Friends and Family:   . Frequency of Social Gatherings with Friends and Family:   . Attends Religious Services:   . Active Member of Clubs or Organizations:   .  Attends Banker Meetings:   Marland Kitchen Marital Status:   Intimate Partner Violence:   . Fear of Current or Ex-Partner:   . Emotionally Abused:   Marland Kitchen Physically Abused:   . Sexually Abused:     Family History  Problem Relation Age of Onset  . Hypertension Mother   . Parkinsonism Mother   . Angina Father   . Heart disease Father   . Colon cancer Father   . Lung cancer Father   . Skin cancer Father   . Cancer Father        Heart  . Breast cancer Sister        over 23  . Diabetes Sister   . Heart disease Brother 48       triple bypass surgery  . Macular degeneration Sister   . Diabetes Sister        prediabetic  . Hearing loss Sister        hearing problems  . Vasculitis Sister     Past Medical History:  Diagnosis Date  . Cyst 07-2008   Cyst on back x2 that were drained  . H/O echocardiogram 02-2000  . Hx of colonic polyps   . Hyperlipidemia   . Hypertension   . Varicose veins of both lower extremities     Past Surgical History:  Procedure Laterality Date  . POLYPECTOMY     Colon  . TONSILLECTOMY    . TUBAL LIGATION      Current Outpatient Medications  Medication Sig Dispense Refill  . atorvastatin (LIPITOR) 10 MG tablet TAKE 1 TABLET BY MOUTH EVERY DAY 90 tablet 2  . losartan (COZAAR) 25 MG tablet Take 25 mg by mouth daily.     No current facility-administered medications  for this visit.    Allergies as of 10/12/2019 - Review Complete 07/03/2019  Allergen Reaction Noted  . Dust mite extract Cough 12/09/2015  . Sulfonamide derivatives      Vitals: BP 138/86   Pulse 93   Wt 148 lb (67.1 kg)   BMI 27.07 kg/m  Last Weight:  Wt Readings from Last 1 Encounters:  10/12/19 148 lb (67.1 kg)   Last Height:   Ht Readings from Last 1 Encounters:  03/27/19 5\' 2"  (1.575 m)    Physical exam:  General: The patient is awake, alert and appears not in acute distress. The patient is well groomed. Head: Normocephalic, atraumatic. Neck is supple. Cardiovascular:  Regular rate and rhythm , without  murmurs or carotid bruit, and without distended neck veins. Respiratory: Lungs are clear to auscultation. Skin:  Without evidence of edema, or rash Trunk: BMI is 27.1 kg/m2  elevated and patient  has normal posture.  Neurologic exam : The patient is awake and alert, oriented to place and time.  Memory subjective described as impaired  There is an ab- normal attention span & concentration ability. Speech is non -fluent with aphasia. Mood and affect are meek.  Cranial nerves: Pupils are equal and briskly reactive to light. No EOM - she moves the whole head-   Funduscopic exam without  evidence of pallor or edema. Extraocular movements  in vertical planes restricted, neither able to pursuit upwards not downwards gaze-  Last time she had horizontal planes with Alvarez, coarse saccades, skipping the central vision without nystagmus.  Reduced, rare blink - masked face.   Confirmed twice that she cannot follow with downward gaze- Visual fields are restricted  Facial sensation intact to fine touch. Facial motor -  reduced facial mimic.  Tongue and uvula move midline.  Tongue protrusion into either cheek is normal. Shoulder shrug is normal.   Motor exam: elevated tone, symmetric muscle bulk and symmetric strength in all extremities.  Sensory:  he seems to have higher  sensitivity on the left, she did not show extinction.  Coordination: Rapid alternating movements in the fingers/hands were very much slowed Finger-to-nose maneuver with evidence of ataxia, dysmetria and just slow , slow. Gait and station: Patient in a wheelchair. Strength delayed -gait appeared stiff, no rotation -   Deep tendon reflexes: in the upper and lower extremities are brisk. Babinski maneuver response is  downgoing.  Assessment:  After physical and neurologic examination, review of laboratory studies, imaging, neurophysiology testing and pre-existing records, assessment is that of :   Neurodegenerative disease process - as we ruled out vascular injuries.  - Gliosis confirms the trauma is most likely the cause of scars unclear if these caused the dementia , but they apply to the hemilateral findings. - see MRI brain.   Progressive - oculomotor palsy, now not able to look up or downwards but also not moving eyes on the horizontal plane.  increased muscle tone, rigidity and propulsive fall risk now wheelchair bound .  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   Left thumb clumsiness. Now clearly increased rigidity in the left wrist, biceps. Hyperreflexia over both patellae.     Marland Kitchen.  MMSE - Mini Mental State Exam 10/12/2019 03/27/2019 02/08/2018  Orientation to time 0 2 1  Orientation to Place 0 2 5  Registration 3 3 3   Attention/ Calculation 0 0 0  Recall 2 2 1   Language- name 2 objects 1 2 2   Language- repeat 1 1 0  Language- follow 3 step command 2 3 3   Language- read & follow direction 0 0 1  Write a sentence 0 0 0  Copy design 0 0 0  Total score 9 15 16       Supranuclear ocular palsy-  Duration 35 minutes.   Plan:  Treatment plan and additional workup :  renewal orders for ST, PT and OT will be needed.   Her left hand is dominant and she has become very clumsy on the left, but also feels a stronger sensitivity to fine touch and pin  prick. Her face is masked. She can hold small- talk. Now in a wheelchair. Left sided weakness, at home PT has noted increased rigidity, left arm clumsy and left Alvarez everted. Marland Kitchen.   She won't take Aricept- she is very sceptical. I am also not sure this will be helpful for PSNP>  I will give Sinemet a trial to help with rigidity.  25/ 100 mg tid, taken 1 hour before each meal. PT, OT , ST ordered.      Rv in 6 month with MMSE- Either by MD or  by NP,  but I will be available for consultation.  Pamela Mylararmen Izamar Linden MD 10/12/2019

## 2019-10-12 NOTE — Patient Instructions (Signed)
Progressive Supranuclear Palsy Progressive supranuclear palsy is a rare brain disorder that causes problems with walking, eye movement, and balance. It may also cause depression, memory loss, and changes in behavior. Progressive supranuclear palsy happens when cells at the base of the brain gradually deteriorate. The condition gets worse with time. There is no cure for this condition. What are the causes? The cause of this condition not known. In some cases it may be caused by genes passed down through families. What increases the risk? You are more likely to develop this condition if:  You are older than 80 years old.  You are female. What are the signs or symptoms? Symptoms of this condition include:  Unexplained falls, or loss of balance while walking.  Walking that is stiff and awkward.  Slow movements and stiff muscles.  Inability to focus the eyes properly.  Dementia that gets worse with time (progressive dementia).  Difficulty swallowing and speaking.  Changes in mood and behavior, such as: ? Lack of feeling or emotion (apathy). ? Irritability. ? Sudden laughing or crying.  Personality changes. How is this diagnosed? This condition is diagnosed based on your symptoms, especially:  Poor balance.  Vision problems.  Slurred speech.  Mental or behavioral changes. You may also have tests to help rule out other causes of your symptoms. These include:  Brain imaging tests such as an MRI or CT scan.  A barium swallow study. In this study, X-rays of your digestive system are taken after you swallow the chemical barium. Barium makes the images clear.  Memory tests, also called neuropsychological testing. How is this treated? There is no cure for this condition, but treatment can help with symptoms. Treatment may include:  Medicines for Parkinson disease. These may help with slowness, stiffness, and balance problems.  Antidepressant medicines to treat  depression.  Walking aids to prevent falls.  Glasses with prisms to help with vision.  A surgical procedure to place a feeding tube into your stomach (gastrostomy) if swallowing becomes very hard. Treatment involves a team of health care providers. The team may include:  A physical therapist. This person can help you identify a safe exercise program.  A speech and language therapist. This person can teach you ways to swallow foods and liquids safely and how to speak more clearly.  An occupational therapist. This person can teach you how to use walking aids and ways to make your home safe. Follow these instructions at home:  Take over-the-counter and prescription medicines only as told by your health care provider.  Use walking aids and prisms as told by your health care provider.  Work closely with your health care team and follow their instructions.  Keep all follow-up visits as told by your health care provider. This is important. Where to find more information  Cure PSP website: https://west-hester.com/  National Institute of Neurological Disorders and Stroke: [employer] Contact a health care provider if:  You have chills or fever.  Your symptoms are getting worse.  You develop new symptoms.  You are losing weight.  You are choking while eating.  You have a cough that will not go away.  You have shortness of breath.  You are anxious or depressed.  You are not getting enough support at home. Get help right away if:  After eating or drinking you choke, cough, or have trouble breathing.  You fall and hurt yourself.  You no longer feel safe at home. Summary  Progressive supranuclear palsy is a rare brain  disorder that causes problems with walking, eye movement, and balance. It may also cause depression, memory loss, and changes in behavior.  There is no cure for this condition, but treatment can help relieve your symptoms.  This condition is diagnosed based on your  symptoms.  Treatment for this condition may include medicines that are used to treat Parkinson disease and medicines that are used to treat other symptoms like depression. Other treatments involve various therapies that help with daily living. This information is not intended to replace advice given to you by your health care provider. Make sure you discuss any questions you have with your health care provider. Document Revised: 02/05/2017 Document Reviewed: 05/13/2016 Elsevier Patient Education  2020 [employer]. Carbidopa; Levodopa tablets What is this medicine? CARBIDOPA;LEVODOPA (kar bi DOE pa; lee voe DOE pa) is used to treat the symptoms of Parkinson's disease. This medicine may be used for other purposes; ask your health care provider or pharmacist if you have questions. COMMON BRAND NAME(S): Atamet, SINEMET What should I tell my health care provider before I take this medicine? They need to know if you have any of these conditions:  depression or other mental illness  diabetes  glaucoma  heart disease, including history of a heart attack  history of irregular heartbeat  kidney disease  liver disease  lung or breathing disease, like asthma  narcolepsy  sleep apnea  stomach or intestine problems  an unusual or allergic reaction to levodopa, carbidopa, other medicines, foods, dyes, or preservatives  pregnant or trying to get pregnant  breast-feeding How should I use this medicine? Take this medicine by mouth with a glass of water. Follow the directions on the prescription label. Take your doses at regular intervals. Do not take your medicine more often than directed. Do not stop taking except on the advice of your doctor or health care professional. Talk to your pediatrician regarding the use of this medicine in children. Special care may be needed. Overdosage: If you think you have taken too much of this medicine contact a poison control center or emergency room at  once. NOTE: This medicine is only for you. Do not share this medicine with others. What if I miss a dose? If you miss a dose, take it as soon as you can. If it is almost time for your next dose, take only that dose. Do not take double or extra doses. What may interact with this medicine? Do not take this medicine with any of the following medications:  MAOIs like Marplan, Nardil, and Parnate  reserpine  tetrabenazine This medicine may also interact with the following medications:  alcohol  droperidol  entacapone  iron supplements or multivitamins with iron  isoniazid, INH  linezolid  medicines for depression, anxiety, or psychotic disturbances  medicines for high blood pressure  medicines for sleep  metoclopramide  papaverine  procarbazine  tedizolid  rasagiline  selegiline  tolcapone This list may not describe all possible interactions. Give your health care provider a list of all the medicines, herbs, non-prescription drugs, or dietary supplements you use. Also tell them if you smoke, drink alcohol, or use illegal drugs. Some items may interact with your medicine. What should I watch for while using this medicine? Visit your health care professional for regular checks on your progress. Tell your health care professional if your symptoms do not start to get better or if they get worse. Do not stop taking except on your health care professional's advice. You may develop a  severe reaction. Your health care professional will tell you how much medicine to take. You may experience a wearing of effect prior to the time for your next dose of this medicine. You may also experience an on-off effect where the medicine apparently stops working for anything from a minute to several hours, then suddenly starts working again. Tell your doctor or health care professional if any of these symptoms happen to you. Your dose may need to be changed. A high protein diet can slow or  prevent absorption of this medicine. Avoid high protein foods near the time of taking this medicine to help to prevent these problems. Take this medicine at least 30 minutes before eating or one hour after meals. You may want to eat higher protein foods later in the day or in small amounts. Discuss your diet with your doctor or health care professional or nutritionist. You may get drowsy or dizzy. Do not drive, use machinery, or do anything that needs mental alertness until you know how this drug affects you. Do not stand or sit up quickly, especially if you are an older patient. This reduces the risk of dizzy or fainting spells. Alcohol may interfere with the effect of this medicine. Avoid alcoholic drinks. When taking this medicine, you may fall asleep without notice. You may be doing activities like driving a car, talking, or eating. You may not feel drowsy before it happens. Contact your health care provider right away if this happens to you. There have been reports of increased sexual urges or other strong urges such as gambling while taking this medicine. If you experience any of these while taking this medicine, you should report this to your health care provider as soon as possible. If you are diabetic, this medicine may interfere with the accuracy of some tests for sugar or ketones in the urine (does not interfere with blood tests). Check with your doctor or health care professional before changing the dose of your diabetic medicine. This medicine may discolor the urine or sweat, making it look darker or red in color. This is of no cause for concern. However, this may stain clothing or fabrics. This medicine may cause a decrease in vitamin B6. You should make sure that you get enough vitamin B6 while you are taking this medicine. Discuss the foods you eat and the vitamins you take with your health care professional. What side effects may I notice from receiving this medicine? Side effects that you  should report to your doctor or health care professional as soon as possible:  allergic reactions like skin rash, itching or hives, swelling of the face, lips, or tongue  changes in emotions or moods  falling asleep during normal activities like driving  fast, irregular heartbeat  feeling faint or lightheaded, falls  fever  hallucinations  new or increased gambling urges, sexual urges, uncontrolled spending, binge or compulsive eating, or other urges  stomach pain  trouble passing urine or change in the amount of urine  uncontrollable movements of the arms, face, head, mouth, neck, or upper body Side effects that usually do not require medical attention (report to your doctor or health care professional if they continue or are bothersome):  dizziness  headache  loss of appetite  nausea  trouble sleeping This list may not describe all possible side effects. Call your doctor for medical advice about side effects. You may report side effects to FDA at 1-800-FDA-1088. Where should I keep my medicine? Keep out of the reach  of children. Store at room temperature between 15 and 30 degrees C (59 and 86 degrees F). Protect from light. Throw away any unused medicine after the expiration date. NOTE: This sheet is a summary. It may not cover all possible information. If you have questions about this medicine, talk to your doctor, pharmacist, or health care provider.  2020 Elsevier/Gold Standard (2018-10-31 19:49:59)

## 2019-10-23 ENCOUNTER — Other Ambulatory Visit: Payer: Self-pay

## 2019-10-23 ENCOUNTER — Ambulatory Visit: Payer: Medicare PPO | Attending: Neurology | Admitting: Physical Therapy

## 2019-10-23 ENCOUNTER — Ambulatory Visit: Payer: Medicare PPO | Admitting: Occupational Therapy

## 2019-10-23 DIAGNOSIS — R41840 Attention and concentration deficit: Secondary | ICD-10-CM

## 2019-10-23 DIAGNOSIS — R41844 Frontal lobe and executive function deficit: Secondary | ICD-10-CM

## 2019-10-23 DIAGNOSIS — R41842 Visuospatial deficit: Secondary | ICD-10-CM | POA: Insufficient documentation

## 2019-10-23 DIAGNOSIS — R2689 Other abnormalities of gait and mobility: Secondary | ICD-10-CM | POA: Insufficient documentation

## 2019-10-23 DIAGNOSIS — R4701 Aphasia: Secondary | ICD-10-CM | POA: Diagnosis not present

## 2019-10-23 DIAGNOSIS — R41841 Cognitive communication deficit: Secondary | ICD-10-CM | POA: Insufficient documentation

## 2019-10-23 DIAGNOSIS — R278 Other lack of coordination: Secondary | ICD-10-CM

## 2019-10-23 DIAGNOSIS — R2681 Unsteadiness on feet: Secondary | ICD-10-CM

## 2019-10-23 DIAGNOSIS — M6281 Muscle weakness (generalized): Secondary | ICD-10-CM | POA: Insufficient documentation

## 2019-10-23 DIAGNOSIS — R29818 Other symptoms and signs involving the nervous system: Secondary | ICD-10-CM | POA: Diagnosis not present

## 2019-10-24 ENCOUNTER — Encounter: Payer: Self-pay | Admitting: Occupational Therapy

## 2019-10-24 NOTE — Therapy (Signed)
[facility] [address] [place], [place], [address] Phone: [phone]   Fax:  [phone]  Occupational Therapy Evaluation  Patient Details  Name: Pamela Alvarez MRN: [id number] Date of Birth: September 22, 1940 Referring Provider (OT): Dr. Vickey Huger   Encounter Date: 10/23/2019   OT End of Session - 10/24/19 0732    Visit Number 1    Number of Visits 25    Date for OT Re-Evaluation 01/22/20   POC written for 12 weeks anticipate d/c after 6-8 weeks   Authorization Type [employer]    Authorization Time Period 16 visitsfor [employer] , 90 day cert    Authorization - Visit Number 1    Authorization - Number of Visits 10    OT Start Time 1452    OT Stop Time 1530    OT Time Calculation (min) 38 min    Activity Tolerance Patient tolerated treatment well    Behavior During Therapy Flat affect           Past Medical History:  Diagnosis Date  . Cyst 07-2008   Cyst on back x2 that were drained  . H/O echocardiogram 02-2000  . Hx of colonic polyps   . Hyperlipidemia   . Hypertension   . Varicose veins of both lower extremities     Past Surgical History:  Procedure Laterality Date  . POLYPECTOMY     Colon  . TONSILLECTOMY    . TUBAL LIGATION      There were no vitals filed for this visit.   Subjective Assessment - 10/24/19 0729    Subjective  Pt's husband reports they have gotten a diagnosis of PSP    Patient Stated Goals to maintain independence, increase left hand use    Currently in Pain? No/denies             [facility] OT Assessment - 10/23/19 1504      Assessment   Medical Diagnosis Progressive supranuclear palsy (PSP)    Referring Provider (OT) Dr. Vickey Huger    Onset Date/Surgical Date 10/18/19    Hand Dominance Left   now uses primarily RUE due to inattention     Precautions   Precautions Fall      Home  Environment   Family/patient expects to be discharged to: Private residence    Lives With Spouse        Prior Function   Level of Independence Needs assistance with gait;Needs assistance with ADLs    Leisure Exercises 3x/wk for exercise instructor in the home      ADL   Eating/Feeding --   uses RUE increased spills, difficulty loading spoon,    Upper Body Bathing + 1 Total asssestance    Lower Body Bathing + 1 Total assistance    Upper Body Dressing Minimal assistance    Lower Body Dressing Maximal assistance    Toilet Transfer Moderate assistance    Toileting - Clothing Manipulation Moderate assistance    Tub/Shower Transfer Moderate assistance    ADL comments Increased time required, only uses LUE 5% or less      Mobility   Mobility Status Needs assist      Written Expression   Dominant Hand Left   LUE prior to onset, now uses RUE as dominant     Vision Assessment   Vision Assessment Vision impaired  _ to be further tested in functional context    Comment Per MD notes pt is unable to perform upward/  downward gaze  Cognition   Overall Cognitive Status History of cognitive impairments - at baseline    Awareness Impaired    Problem Solving Impaired    Teacher, early years/prexecutive Function Sequencing;Initiating;Self Correcting    Initiating Impaired    Self Correcting Impaired    Behaviors --   flat affect, slowed processing, limited speech     Observation/Other Assessments   Other Surveys  Select    Simulated Eating Time (seconds) 1 min 7 secs      Posture/Postural Control   Posture/Postural Control Postural limitations    Postural Limitations Forward head      Coordination   Gross Motor Movements are Fluid and Coordinated No    Fine Motor Movements are Fluid and Coordinated No    Box and Blocks RUE 15, LUE 5 blocks    Coordination imapired for LUE, pt demonstrates inattention      ROM / Strength   AROM / PROM / Strength AROM      AROM   Overall AROM  Deficits    Overall AROM Comments RUE shoulder flexion 100, elbow extension -10, LUE shoulder flexion 85, elbow extension-20,  decreased bilateral supination.                             OT Short Term Goals - 10/24/19 1249      OT SHORT TERM GOAL #1   Title I with HEP.    Time 4    Period Weeks    Status New    Target Date 11/23/19      OT SHORT TERM GOAL #2   Title Pt will use LUE to assist with ADLs/ functional activities at least 10% of the time with min v.c    Baseline uses 5% or less    Time 4    Period Weeks    Status New      OT SHORT TERM GOAL #3   Title Pt's caregiver will verbalize understanding of adapted strategies to maximize safety and I with ADLs/ IADLs .    Time 4    Period Weeks    Status New             OT Long Term Goals - 10/24/19 1250      OT LONG TERM GOAL #1   Title Pt will perform teeth brushing with improved throughness and only min v.c from husband.    Baseline Pt is not performing thoroughly enough    Time 12    Period Weeks    Status New      OT LONG TERM GOAL #2   Title Pt will demonstrate improved ease with self feeding as evidenced by decreasing PPT#2 to 55 secs or less.    Time 12    Period Weeks    Status New      OT LONG TERM GOAL #3   Title Pt will demonstrate improved LUE functional use for ADLs as evidenced by increasing box/ blocks score to 8 blocks for LUE.    Time 12    Period Weeks    Status New      OT LONG TERM GOAL #4   Title Pt will demonstrate ability to retrieve a lightweight item at 90 shoulder flexion with -15 elbow extension.    Baseline LUE 8 shoulder flexion , -20 elbow extension.    Time 12    Period Weeks    Status New  Plan - 10/24/19 0800    Clinical Impression Statement Pt is a 79 y.o female with new diagnosis of PSP. PMH: MRI -right frontal, right parietal and right cerebellar encephalomalacia and gliosis, possible embolic chronic ischemic infarcts., hx of ankle fx, hyperlipidemia, and thumb CMC arthritis. Pt was previously seen at this clinic for occupational therapy.  Pt has had  an overall decline in ADL perfromance and LUE use. Pt presents with the following deficits: decreased coordination, decreased balance, cognitive deficits, decreased LUE functional use, L inattention,, decreased balance, decreased functional mobility, and communication deficits which impedes performance of ADLs/ IADLs. Pt can benefit from skilled occupational therapy to address these deficits.    OT Occupational Profile and History Detailed Assessment- Review of Records and additional review of physical, cognitive, psychosocial history related to current functional performance    Occupational performance deficits (Please refer to evaluation for details): ADL's;IADL's;Leisure;Social Participation    Body Structure / Function / Physical Skills ADL;Balance;Mobility;Strength;UE functional use;Tone;FMC;Flexibility;Coordination;Gait;Vision;Sensation;IADL;Dexterity;Decreased knowledge of use of DME;GMC;Decreased knowledge of precautions    Cognitive Skills Attention;Problem Solve;Safety Awareness;Sequencing;Thought;Understand    Rehab Potential Fair    Clinical Decision Making Several treatment options, min-mod task modification necessary    Comorbidities Affecting Occupational Performance: May have comorbidities impacting occupational performance    Modification or Assistance to Complete Evaluation  Min-Moderate modification of tasks or assist with assess necessary to complete eval    OT Frequency 2x / week    OT Duration 12 weeks   plus eval, anticipate d/c after 6-8 weeks dep on pt progress   OT Treatment/Interventions Self-care/ADL training;Therapeutic exercise;Splinting;Manual Therapy;Neuromuscular education;Ultrasound;Aquatic Therapy;Therapeutic activities;Paraffin;DME and/or AE instruction;Cognitive remediation/compensation;Visual/perceptual remediation/compensation;Gait Training;Fluidtherapy;Electrical Stimulation;Moist Heat;Contrast Bath;Passive range of motion;Patient/family education    Plan initiate  HEP for LUE functional use, ADL strategies    Consulted and Agree with Plan of Care Patient;Family member/caregiver           Patient will benefit from skilled therapeutic intervention in order to improve the following deficits and impairments:   Body Structure / Function / Physical Skills: ADL, Balance, Mobility, Strength, UE functional use, Tone, FMC, Flexibility, Coordination, Gait, Vision, Sensation, IADL, Dexterity, Decreased knowledge of use of DME, GMC, Decreased knowledge of precautions Cognitive Skills: Attention, Problem Solve, Safety Awareness, Sequencing, Thought, Understand     Visit Diagnosis: Other symptoms and signs involving the nervous system - Plan: Ot plan of care cert/re-cert  Muscle weakness (generalized) - Plan: Ot plan of care cert/re-cert  Frontal lobe and executive function deficit - Plan: Ot plan of care cert/re-cert  Attention and concentration deficit - Plan: Ot plan of care cert/re-cert  Visuospatial deficit - Plan: Ot plan of care cert/re-cert  Other lack of coordination - Plan: Ot plan of care cert/re-cert  Other abnormalities of gait and mobility - Plan: Ot plan of care cert/re-cert  Unsteadiness on feet - Plan: Ot plan of care cert/re-cert    Problem List Patient Active Problem List   Diagnosis Date Noted  . Progressive supranuclear palsy (HCC) 10/12/2019  . Encephalomalacia on imaging study 10/12/2019  . Fluency disorder associated with underlying disease 02/08/2018  . Cognitive and neurobehavioral dysfunction 02/08/2018  . 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 Arthritis of carpometacarpal [facility]) joint of left thumb 01/24/2018  . Ankle fracture, left 09/03/2016  . Hand dysfunction 03/11/2016  . Symptomatic varicose veins, bilateral 03/11/2016  . Numbness of left thumb 03/11/2016  . Elevated BP 07/27/2014  . Bunion of left foot 07/26/2014  . Abnormality of gait 07/26/2014  . Cough 11/10/2013  .  Post-nasal drainage 11/10/2013  .  Abnormal urine odor 11/10/2013  . Visit for preventive health examination 07/28/2013  . Right anterior knee pain 07/22/2012  . Medicare annual wellness visit, subsequent 07/25/2011  . ANXIETY, SITUATIONAL 09/25/2009  . ARTHRITIS 09/25/2009  . Vitamin D deficiency 09/27/2008  . Hyperlipidemia 09/17/2008  . Elevated blood pressure reading without diagnosis of hypertension 09/17/2008  . COLONIC POLYPS, HX OF 09/17/2008  . PERSONAL HISTORY DISEASES SKIN&SUBCUT TISSUE 09/17/2008    Jolea Dolle 10/24/2019, 1:13 PM Keene Breath, OTR/L [phone] Phone: [phone] 1:14 PM 10/24/19 [facility] [address] [place], [place], [address] Phone: [phone]   Fax:  [phone]  Name: Pamela Alvarez MRN: [id number] Date of Birth: [birth year]

## 2019-10-25 ENCOUNTER — Other Ambulatory Visit: Payer: Self-pay

## 2019-10-25 ENCOUNTER — Ambulatory Visit: Payer: Medicare PPO | Admitting: Occupational Therapy

## 2019-10-25 ENCOUNTER — Encounter: Payer: Self-pay | Admitting: Occupational Therapy

## 2019-10-25 DIAGNOSIS — R41840 Attention and concentration deficit: Secondary | ICD-10-CM

## 2019-10-25 DIAGNOSIS — R41844 Frontal lobe and executive function deficit: Secondary | ICD-10-CM

## 2019-10-25 DIAGNOSIS — R2689 Other abnormalities of gait and mobility: Secondary | ICD-10-CM | POA: Diagnosis not present

## 2019-10-25 DIAGNOSIS — M6281 Muscle weakness (generalized): Secondary | ICD-10-CM

## 2019-10-25 DIAGNOSIS — R278 Other lack of coordination: Secondary | ICD-10-CM

## 2019-10-25 DIAGNOSIS — R41842 Visuospatial deficit: Secondary | ICD-10-CM

## 2019-10-25 NOTE — Therapy (Signed)
[facility] [address] [place], [place], [address] Phone: [phone]   Fax:  [phone]  Occupational Therapy Treatment  Patient Details  Name: Pamela Alvarez MRN: [id number] Date of Birth: 21-Sep-1940 Referring Provider (OT): Dr. Vickey Huger   Encounter Date: 10/25/2019   OT End of Session - 10/25/19 1601    Visit Number 2    Number of Visits 25    Date for OT Re-Evaluation 01/22/20   POC written for 12 weeks anticipate d/c after 6-8 weeks   Authorization Type [employer]    Authorization Time Period 16 visitsfor [employer] , 90 day cert    Authorization - Visit Number 2    Authorization - Number of Visits 10    OT Start Time 1405    OT Stop Time 1445    OT Time Calculation (min) 40 min    Activity Tolerance Patient tolerated treatment well    Behavior During Therapy Flat affect           Past Medical History:  Diagnosis Date  . Cyst 07-2008   Cyst on back x2 that were drained  . H/O echocardiogram 02-2000  . Hx of colonic polyps   . Hyperlipidemia   . Hypertension   . Varicose veins of both lower extremities     Past Surgical History:  Procedure Laterality Date  . POLYPECTOMY     Colon  . TONSILLECTOMY    . TUBAL LIGATION      There were no vitals filed for this visit.   Subjective Assessment - 10/25/19 1601    Subjective  No when asked if she has pain    Patient Stated Goals to maintain independence, increase left hand use    Currently in Pain? No/denies                                OT Education - 10/25/19 1603    Education Details Feeding strategies(choke down on spoon, use of mug for soup, resting spoon in between bites to avoid over filling mouth) Pt practiced using apple sauce and RUE, min v.c to pause between bites, no spills, Functional use of LUEHEP flipping cards, picking up coins to place in band, folding washcloths) min-mod v.c for LUE functional use    Person(s)  Educated Patient;Spouse    Methods Explanation;Demonstration;Verbal cues;Handout    Comprehension Verbalized understanding;Returned demonstration;Verbal cues required            OT Short Term Goals - 10/24/19 1249      OT SHORT TERM GOAL #1   Title I with HEP.    Time 4    Period Weeks    Status New    Target Date 11/23/19      OT SHORT TERM GOAL #2   Title Pt will use LUE to assist with ADLs/ functional activities at least 10% of the time with min v.c    Baseline uses 5% or less    Time 4    Period Weeks    Status New      OT SHORT TERM GOAL #3   Title Pt's caregiver will verbalize understanding of adapted strategies to maximize safety and I with ADLs/ IADLs .    Time 4    Period Weeks    Status New             OT Long Term Goals - 10/24/19 1250  OT LONG TERM GOAL #1   Title Pt will perform teeth brushing with improved throughness and only min v.c from husband.    Baseline Pt is not performing thoroughly enough    Time 12    Period Weeks    Status New      OT LONG TERM GOAL #2   Title Pt will demonstrate improved ease with self feeding as evidenced by decreasing PPT#2 to 55 secs or less.    Time 12    Period Weeks    Status New      OT LONG TERM GOAL #3   Title Pt will demonstrate improved LUE functional use for ADLs as eveidenced by increasing box/ blocks score to 8 blocks for LUE.    Time 12    Period Weeks    Status New      OT LONG TERM GOAL #4   Title Pt will demonstrate ability to retrieve a leightweight item at 90 shoulder flexion with -15 elbow extension.    Baseline LUE 8 shoulder flexion , -20 elbow extension.    Time 12    Period Weeks    Status New                 Plan - 10/25/19 1602    Clinical Impression Statement Pt is progressing towards goals. Therapist started education regarding feeding with pt/ husband.    OT Occupational Profile and History Detailed Assessment- Review of Records and additional review of physical,  cognitive, psychosocial history related to current functional performance    Occupational performance deficits (Please refer to evaluation for details): ADL's;IADL's;Leisure;Social Participation    Body Structure / Function / Physical Skills ADL;Balance;Mobility;Strength;UE functional use;Tone;FMC;Flexibility;Coordination;Gait;Vision;Sensation;IADL;Dexterity;Decreased knowledge of use of DME;GMC;Decreased knowledge of precautions    Cognitive Skills Attention;Problem Solve;Safety Awareness;Sequencing;Thought;Understand    Rehab Potential Fair    Clinical Decision Making Several treatment options, min-mod task modification necessary    Comorbidities Affecting Occupational Performance: May have comorbidities impacting occupational performance    Modification or Assistance to Complete Evaluation  Min-Moderate modification of tasks or assist with assess necessary to complete eval    OT Frequency 2x / week    OT Duration 12 weeks   plus eval, anticipate d/c after 6-8 weeks dep on pt progress   OT Treatment/Interventions Self-care/ADL training;Therapeutic exercise;Splinting;Manual Therapy;Neuromuscular education;Ultrasound;Aquatic Therapy;Therapeutic activities;Paraffin;DME and/or AE instruction;Cognitive remediation/compensation;Visual/perceptual remediation/compensation;Gait Training;Fluidtherapy;Electrical Stimulation;Moist Heat;Contrast Bath;Passive range of motion;Patient/family education    Plan LUE functional use, ADL strategies    Consulted and Agree with Plan of Care Patient;Family member/caregiver           Patient will benefit from skilled therapeutic intervention in order to improve the following deficits and impairments:   Body Structure / Function / Physical Skills: ADL, Balance, Mobility, Strength, UE functional use, Tone, FMC, Flexibility, Coordination, Gait, Vision, Sensation, IADL, Dexterity, Decreased knowledge of use of DME, GMC, Decreased knowledge of precautions Cognitive Skills:  Attention, Problem Solve, Safety Awareness, Sequencing, Thought, Understand     Visit Diagnosis: Muscle weakness (generalized)  Frontal lobe and executive function deficit  Attention and concentration deficit  Visuospatial deficit  Other lack of coordination    Problem List Patient Active Problem List   Diagnosis Date Noted  . Progressive supranuclear palsy (HCC) 10/12/2019  . Encephalomalacia on imaging study 10/12/2019  . Fluency disorder associated with underlying disease 02/08/2018  . Cognitive and neurobehavioral dysfunction 02/08/2018  . 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 Arthritis of carpometacarpal [facility]) joint of left thumb 01/24/2018  .  Ankle fracture, left 09/03/2016  . Hand dysfunction 03/11/2016  . Symptomatic varicose veins, bilateral 03/11/2016  . Numbness of left thumb 03/11/2016  . Elevated BP 07/27/2014  . Bunion of left foot 07/26/2014  . Abnormality of gait 07/26/2014  . Cough 11/10/2013  . Post-nasal drainage 11/10/2013  . Abnormal urine odor 11/10/2013  . Visit for preventive health examination 07/28/2013  . Right anterior knee pain 07/22/2012  . Medicare annual wellness visit, subsequent 07/25/2011  . ANXIETY, SITUATIONAL 09/25/2009  . ARTHRITIS 09/25/2009  . Vitamin D deficiency 09/27/2008  . Hyperlipidemia 09/17/2008  . Elevated blood pressure reading without diagnosis of hypertension 09/17/2008  . COLONIC POLYPS, HX OF 09/17/2008  . PERSONAL HISTORY DISEASES SKIN&SUBCUT TISSUE 09/17/2008    Pamela Alvarez 10/25/2019, 4:06 PM  [facility] [address] [place], [place], [address] Phone: [phone]   Fax:  [phone]  Name: Pamela Alvarez MRN: [id number] Date of Birth: Jun 04, 1940

## 2019-10-25 NOTE — Therapy (Signed)
[facility] [address] [place], [place], [address] Phone: [phone]   Fax:  [phone]  Physical Therapy Evaluation  Patient Details  Name: Pamela Alvarez MRN: [id number] Date of Birth: [birth year] Referring Provider (PT): Dohmeier, Porfirio Mylar   Encounter Date: 10/23/2019   PT End of Session - 10/25/19 0722    Visit Number 1    Number of Visits 25    Date for PT Re-Evaluation 01/23/20    Authorization Type [employer] Medicare-submitted auth at completion of eval    Progress Note Due on Visit 10    PT Start Time 1404    PT Stop Time 1445    PT Time Calculation (min) 41 min    Equipment Utilized During Treatment Gait belt    Activity Tolerance Patient tolerated treatment well    Behavior During Therapy Flat affect           Past Medical History:  Diagnosis Date  . Cyst 07-2008   Cyst on back x2 that were drained  . H/O echocardiogram 02-2000  . Hx of colonic polyps   . Hyperlipidemia   . Hypertension   . Varicose veins of both lower extremities     Past Surgical History:  Procedure Laterality Date  . POLYPECTOMY     Colon  . TONSILLECTOMY    . TUBAL LIGATION      There were no vitals filed for this visit.    Subjective Assessment - 10/25/19 0714    Subjective Have gotten the diagnosis of PSP since we were here for PT last.  Has someone come into the house and helps with exercise 3 days per week; uses weights as much as possible.  Not walking as much; using the rollator for short distances; has transport chair they use for longer distances.  No real falls, but almost lost balance.  Since previous d/c:  more difficulty getting into the car, more difficulty with L arm, walking less distance.    Patient is accompained by: Family member   Husband-provides history   Patient Stated Goals To try to maintain as much as possible, try to prevent regression as much as possible.    Currently in Pain? No/denies               [facility] PT Assessment - 10/25/19 0001      Assessment   Medical Diagnosis Progressive supranuclear palsy (PSP)    Referring Provider (PT) Dohmeier, Porfirio Mylar    Onset Date/Surgical Date 10/18/19    Hand Dominance Left      Precautions   Precautions Fall    Precaution Comments Pt has tendency to stand and fall posterior direction; however, husband is always with her and does not let her stand alone.      Balance Screen   Has the patient fallen in the past 6 months No    How many times? --   Near falls, per husband report   Has the patient had a decrease in activity level because of a fear of falling?  No    Is the patient reluctant to leave their home because of a fear of falling?  No      Home Nurse, mental health Private residence    Living Arrangements Spouse/significant other    Available Help at Discharge Family    Type of Home House    Home Access Ramped entrance    Home Layout Two level;Able to live on main level with bedroom/bathroom  Home Equipment Walker - 2 wheels;Grab bars - toilet;Grab bars - tub/shower;Shower seat;Walker - 4 wheels;Transport chair    Additional Comments Has Med Alert (both pt and husband)      Prior Function   Level of Independence Needs assistance with gait;Needs assistance with ADLs    Leisure Exercises 3x/wk for exercise instructor in the home; husband gets pt out of the house daily for errands (primarily uses transport chair now)      Cognition   Awareness --    Problem Solving --    Executive Function --    Initiating --    Self Correcting Impaired    Behaviors --   flat affect, slowed processing, limited speech     Observation/Other Assessments   Focus on Therapeutic Outcomes (FOTO)  NA      Posture/Postural Control   Posture/Postural Control Postural limitations    Postural Limitations Forward head;Rounded Shoulders;Flexed trunk      ROM / Strength   AROM / PROM / Strength AROM;Strength      AROM   Overall AROM   Deficits    Overall AROM Comments decreased full knee extension actively, decreased ankle dorsiflexion      Strength   Overall Strength Deficits    Overall Strength Comments Difficulty following directions for MMT, grossly 3-/5to 3/5 for UE's      Transfers   Transfers Sit to Stand;Stand to Sit    Sit to Stand 4: Min assist;3: Mod assist    Sit to Stand Details (indicate cue type and reason) assist to prevent posterior lean/retropulsion and for hand placement    Stand to Sit 3: Mod assist    Stand to Sit Details Pt turns to sit prior to fully being near chair and needs assist for safe completion of turn/pivot to sit in chair    Comments Pt requires extra time for sit>stand, taking 7.78 sec to stand up to rollator to prepare to walk      Ambulation/Gait   Ambulation/Gait Yes    Ambulation/Gait Assistance 4: Min assist    Ambulation Distance (Feet) 40 Feet   x 2   Assistive device Rollator    Gait Pattern Step-through pattern;Step-to pattern;Decreased step length - right;Decreased step length - left;Decreased stride length;Decreased dorsiflexion - right;Decreased dorsiflexion - left;Trunk flexed;Poor foot clearance - left;Poor foot clearance - right    Ambulation Surface Level;Indoor    Gait velocity 24.82 sec over 21 ft = 0.85 ft/sec      Standardized Balance Assessment   Standardized Balance Assessment Timed Up and Go Test      Timed Up and Go Test   Normal TUG (seconds) 53.69   rollator and min assist   TUG Comments Scores >13.5 sec indicate increased fall risk; > 30 sec indicate difficultyw ith ADLs in the home.                       Objective measurements completed on examination: See above findings.               PT Education - 10/25/19 0722    Education Details Eval results and POC    Person(s) Educated Patient;Spouse    Methods Explanation    Comprehension Verbalized understanding            PT Short Term Goals - 10/25/19 0732      PT  SHORT TERM GOAL #1   Title Pt will perform HEP with family supervision for improved strength, balance,  transfers, and gait.  TARGET 11/17/2019    Time 4    Period Weeks    Status New      PT SHORT TERM GOAL #2   Title Pt will perform  4 of 5 trials of sit to stand with minimal assistance.    Baseline min/mod assist and posterior lean    Time 4    Period Weeks    Status New      PT SHORT TERM GOAL #3   Title Pt will improve TUG score to less than or equal to 45 seconds for decreased fall risk.    Baseline 53.69 sec    Time 4    Period Weeks    Status New      PT SHORT TERM GOAL #4   Title Pt will ambulate at least 2 minutes of continuous gait with rollator and min assist for improved gait endurance and mobility in her home.    Baseline fatigues at approx 1 min of gait    Time 4    Period Weeks    Status New             PT Long Term Goals - 10/25/19 0735      PT LONG TERM GOAL #1   Title Pt/husband will verbalize and demonstrate understanding of final HEP to maintain functional gains made in therapy.  TARGET 01/12/2020    Time 12    Period Weeks    Status New      PT LONG TERM GOAL #2   Title Pt will perform TUG shuttle activity with rollator and min guard, at least 3 sets without break, to demonstrate improved transfer efficiency and safety.    Time 12    Period Weeks    Status New      PT LONG TERM GOAL #3   Title Pt will improve TUG score to less than or equal to 30 seconds for decreased fall risk/improved mobility in the home.    Time 12    Status New      PT LONG TERM GOAL #4   Title Pt/husband will verbalize understanding of fall prevention in home environment.    Time 12    Period Weeks    Status New      PT LONG TERM GOAL #5   Title Pt will improve gait velocity to at least 1.1 ft/sec for improved gait efficiency and safety in the home.    Time 12    Period Weeks    Status New                  Plan - 10/25/19 0724    Clinical Impression  Statement Pt is a 79 year old female known to this clinic, with recent diagnosis of PSP (progressive supranuclear palsy).  Husband provides most of history and is providing care and assistance for patient at home.  She has had no falls, though she does have strong posterior lean/retropulsion tendency in standing and with transfers.  She presents with decreased functional strength, decreased gait independence, safety and endurance, decreased balance, abnormal posture, decreased safety awareness.  Pt's objective measures with gait velocity and TUG scores have slowed since d/c in April 2021, both indicating increased fall risk for patient.  Husband is supportive and pt will beneift from skilled PT to address the above stated deficits for improved functional mobilitya nd safety in the home and to decrease risk of future falls.    Personal Factors and Comorbidities  Comorbidity 3+;Other    Comorbidities See problem list; recent diagnosis of PSP    Examination-Activity Limitations Locomotion Level;Transfers;Dressing;Stairs;Stand;Toileting    Examination-Participation Restrictions Community Activity;Shop    Stability/Clinical Decision Making Evolving/Moderate complexity    Clinical Decision Making Moderate    Rehab Potential Good    PT Frequency 2x / week    PT Duration 12 weeks   including eval week   PT Treatment/Interventions ADLs/Self Care Home Management;Gait training;Functional mobility training;Therapeutic activities;Therapeutic exercise;Balance training;Neuromuscular re-education;Patient/family education    PT Next Visit Plan Work on transfers, use of machine for aerobic activity to improve strength, flexibility; look at previous HEP and initiate/update HEP as appropriate; posture and gait activities    Consulted and Agree with Plan of Care Patient;Family member/caregiver    Family Member Consulted Husband           Patient will benefit from skilled therapeutic intervention in order to improve the  following deficits and impairments:  Abnormal gait, Difficulty walking, Decreased safety awareness, Decreased endurance, Decreased activity tolerance, Decreased balance, Decreased mobility, Decreased strength, Postural dysfunction  Visit Diagnosis: Other abnormalities of gait and mobility  Unsteadiness on feet  Muscle weakness (generalized)  Other symptoms and signs involving the nervous system     Problem List Patient Active Problem List   Diagnosis Date Noted  . Progressive supranuclear palsy (HCC) 10/12/2019  . Encephalomalacia on imaging study 10/12/2019  . Fluency disorder associated with underlying disease 02/08/2018  . Cognitive and neurobehavioral dysfunction 02/08/2018  . 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 Arthritis of carpometacarpal [facility]) joint of left thumb 01/24/2018  . Ankle fracture, left 09/03/2016  . Hand dysfunction 03/11/2016  . Symptomatic varicose veins, bilateral 03/11/2016  . Numbness of left thumb 03/11/2016  . Elevated BP 07/27/2014  . Bunion of left foot 07/26/2014  . Abnormality of gait 07/26/2014  . Cough 11/10/2013  . Post-nasal drainage 11/10/2013  . Abnormal urine odor 11/10/2013  . Visit for preventive health examination 07/28/2013  . Right anterior knee pain 07/22/2012  . Medicare annual wellness visit, subsequent 07/25/2011  . ANXIETY, SITUATIONAL 09/25/2009  . ARTHRITIS 09/25/2009  . Vitamin D deficiency 09/27/2008  . Hyperlipidemia 09/17/2008  . Elevated blood pressure reading without diagnosis of hypertension 09/17/2008  . COLONIC POLYPS, HX OF 09/17/2008  . PERSONAL HISTORY DISEASES SKIN&SUBCUT TISSUE 09/17/2008    Briarrose Shor W. 10/25/2019, 7:41 AM Gean Maidens., PT  [facility] [address] [place], [place], [address] Phone: [phone]   Fax:  [phone]  Name: Pamela Alvarez MRN: [id number] Date of Birth: Nov 11, 1940

## 2019-10-26 ENCOUNTER — Ambulatory Visit: Payer: Medicare PPO | Admitting: Physical Therapy

## 2019-10-26 DIAGNOSIS — R2689 Other abnormalities of gait and mobility: Secondary | ICD-10-CM

## 2019-10-26 DIAGNOSIS — R4701 Aphasia: Secondary | ICD-10-CM | POA: Diagnosis not present

## 2019-10-26 DIAGNOSIS — R41842 Visuospatial deficit: Secondary | ICD-10-CM | POA: Diagnosis not present

## 2019-10-26 DIAGNOSIS — M6281 Muscle weakness (generalized): Secondary | ICD-10-CM

## 2019-10-26 DIAGNOSIS — R41840 Attention and concentration deficit: Secondary | ICD-10-CM | POA: Diagnosis not present

## 2019-10-26 DIAGNOSIS — R41841 Cognitive communication deficit: Secondary | ICD-10-CM | POA: Diagnosis not present

## 2019-10-26 DIAGNOSIS — R29818 Other symptoms and signs involving the nervous system: Secondary | ICD-10-CM | POA: Diagnosis not present

## 2019-10-26 DIAGNOSIS — R2681 Unsteadiness on feet: Secondary | ICD-10-CM | POA: Diagnosis not present

## 2019-10-26 DIAGNOSIS — R41844 Frontal lobe and executive function deficit: Secondary | ICD-10-CM | POA: Diagnosis not present

## 2019-10-26 NOTE — Therapy (Signed)
[facility] [address] [place], [place], [address] Phone: [phone]   Fax:  [phone]  Physical Therapy Treatment  Patient Details  Name: Pamela ApleySandra Alvarez MRN: [id number] Date of Birth: 03/10/1940 Referring Provider (PT): Dohmeier, Porfirio Mylararmen   Encounter Date: 10/26/2019   PT End of Session - 10/26/19 2104    Visit Number 2    Number of Visits 25    Date for PT Re-Evaluation 01/21/20    Authorization Type [employer] Medicare-submitted auth at completion of eval    Progress Note Due on Visit 10    PT Start Time 1534    PT Stop Time 1618    PT Time Calculation (min) 44 min    Equipment Utilized During Treatment Gait belt    Activity Tolerance Patient tolerated treatment well    Behavior During Therapy Flat affect           Past Medical History:  Diagnosis Date  . Cyst 07-2008   Cyst on back x2 that were drained  . H/O echocardiogram 02-2000  . Hx of colonic polyps   . Hyperlipidemia   . Hypertension   . Varicose veins of both lower extremities     Past Surgical History:  Procedure Laterality Date  . POLYPECTOMY     Colon  . TONSILLECTOMY    . TUBAL LIGATION      There were no vitals filed for this visit.   Subjective Assessment - 10/26/19 2054    Subjective Per husband:  Had a time getting in the car today, and it's getting harder.  We almost fell today, and I'm looking into a car lift seat being installed.    Patient is accompained by: Family member   Husband-provides history   Patient Stated Goals To try to maintain as much as possible, try to prevent regression as much as possible.    Currently in Pain? No/denies                             [facility] Adult [facility]/Exercise - 10/26/19 0001      Transfers   Transfers Sit to Stand;Stand to Sit    Sit to Stand 4: Min assist;3: Mod assist    Sit to Stand Details Verbal cues for sequencing;Verbal cues for technique;Tactile cues for  placement;Tactile cues for initiation    Stand to Sit 3: Mod assist    Stand to Sit Details (indicate cue type and reason) Tactile cues for sequencing;Tactile cues for weight shifting;Verbal cues for sequencing;Verbal cues for precautions/safety;Verbal cues for technique    Stand to Sit Details Cues to fully turn to sit at w/c.    Comments Performed sit to stand x 10 reps from elevated mat surface, simulating higher seat in car, with PT in front of patient for improved forward lean to initiate standing up and for assist and cues for increased hinge/flexion at hips to sit.      Ambulation/Gait   Ambulation/Gait Yes    Ambulation/Gait Assistance 4: Min assist    Ambulation/Gait Assistance Details Cues to stop and reset posture x 1 during gait    Ambulation Distance (Feet) 50 Feet    Assistive device Rollator    Gait Pattern Step-through pattern;Step-to pattern;Decreased step length - right;Decreased step length - left;Decreased stride length;Decreased dorsiflexion - right;Decreased dorsiflexion - left;Trunk flexed;Poor foot clearance - left;Poor foot clearance - right    Ambulation Surface Level;Indoor  Self-Care   Self-Care Other Self-Care Comments    Other Self-Care Comments  Discussed options for equipment for car transfer assistance.  Pt's husband is looking into a car lift chair to be installed.  PT provides education on alternative options, including pivot disc in seat (husband tried during session and would not work in the [employer]), autoslide patient positioning cushion, (showed options online and in [employer])      Therapeutic Activites    Therapeutic Activities --          Simulated car transfer in clinic (from w/c to elevated mat surface), with husband demo how they do this (standing, stepping L foot into car, then he assists with her buttocks and her R foot to bring into the car.  PT attempts to show and demo additional method:  Turning to back up to side of seat  (simulated elevated mat surface) and then sit down (then discussed lifting feet into car).  Pt has difficulty following simulation discussion during therapy, so at end of session, therapist went to car to assist.  Pt's husband demonstrates technique above (stepping into the car, then husband assists at buttocks and RLE to scoot/lift.  This appears to work safety, with cues provided for husband's positioning.  PT discusses additional option above to turn and sit, but seat appears too far away for safely backing up to sit.  This will likely need to be additional discussion and practice for safety with car transfers.  With repeated practice of turning to sit in PT session, PT provides cues to "NOT SIT UNTIL I SAY STOP", and PT provides assist for weightshifting to fully turn.  Pt responds well to this cue x 2 reps.         PT Education - 10/26/19 2103    Education Details Educated pt and husband in alternative methods for transfer assistance and safe techniques for trasnfers into car    Person(s) Educated Patient;Spouse    Methods Explanation;Demonstration;Verbal cues    Comprehension Verbalized understanding;Returned demonstration            PT Short Term Goals - 10/25/19 0732      PT SHORT TERM GOAL #1   Title Pt will perform HEP with family supervision for improved strength, balance, transfers, and gait.  TARGET 11/17/2019    Time 4    Period Weeks    Status New      PT SHORT TERM GOAL #2   Title Pt will perform  4 of 5 trials of sit to stand with minimal assistance.    Baseline min/mod assist and posterior lean    Time 4    Period Weeks    Status New      PT SHORT TERM GOAL #3   Title Pt will improve TUG score to less than or equal to 45 seconds for decreased fall risk.    Baseline 53.69 sec    Time 4    Period Weeks    Status New      PT SHORT TERM GOAL #4   Title Pt will ambulate at least 2 minutes of continuous gait with rollator and min assist for improved gait  endurance and mobility in her home.    Baseline fatigues at approx 1 min of gait    Time 4    Period Weeks    Status New             PT Long Term Goals - 10/25/19 2423  PT LONG TERM GOAL #1   Title Pt/husband will verbalize and demonstrate understanding of final HEP to maintain functional gains made in therapy.  TARGET 01/12/2020    Time 12    Period Weeks    Status New      PT LONG TERM GOAL #2   Title Pt will perform TUG shuttle activity with rollator and min guard, at least 3 sets without break, to demonstrate improved transfer efficiency and safety.    Time 12    Period Weeks    Status New      PT LONG TERM GOAL #3   Title Pt will improve TUG score to less than or equal to 30 seconds for decreased fall risk/improved mobility in the home.    Time 12    Status New      PT LONG TERM GOAL #4   Title Pt/husband will verbalize understanding of fall prevention in home environment.    Time 12    Period Weeks    Status New      PT LONG TERM GOAL #5   Title Pt will improve gait velocity to at least 1.1 ft/sec for improved gait efficiency and safety in the home.    Time 12    Period Weeks    Status New                 Plan - 10/26/19 2105    Clinical Impression Statement Pt's husband concerned about safety with car transfers (particularly getting into car) safely, as they had a near fall today trying to get into the car.  PT focuses most of session on sit<>stand transfers and simulating safe technique to back up to seat and then have husband assist swinging legs into seat.  Upon going to pt's Pamela Alvarez, husband demo how they do it and it appears safe, with additional cues provided for husband positioning.    Personal Factors and Comorbidities Comorbidity 3+;Other    Comorbidities See problem list; recent diagnosis of PSP    Examination-Activity Limitations Locomotion Level;Transfers;Dressing;Stairs;Stand;Toileting    Examination-Participation Restrictions Community  Activity;Shop    Stability/Clinical Decision Making Evolving/Moderate complexity    Rehab Potential Good    PT Frequency 2x / week    PT Duration 12 weeks   including eval week   PT Treatment/Interventions ADLs/Self Care Home Management;Gait training;Functional mobility training;Therapeutic activities;Therapeutic exercise;Balance training;Neuromuscular re-education;Patient/family education    PT Next Visit Plan Ask about car trasnfers, use of aerobic machine for aerobic activity; work on step out/in with each leg in sitting/standing for assist with car transfer.  look at previous HEP and update as apropriate.    Consulted and Agree with Plan of Care Patient;Family member/caregiver    Family Member Consulted Husband           Patient will benefit from skilled therapeutic intervention in order to improve the following deficits and impairments:  Abnormal gait, Difficulty walking, Decreased safety awareness, Decreased endurance, Decreased activity tolerance, Decreased balance, Decreased mobility, Decreased strength, Postural dysfunction  Visit Diagnosis: Unsteadiness on feet  Other abnormalities of gait and mobility  Muscle weakness (generalized)     Problem List Patient Active Problem List   Diagnosis Date Noted  . Progressive supranuclear palsy (HCC) 10/12/2019  . Encephalomalacia on imaging study 10/12/2019  . Fluency disorder associated with underlying disease 02/08/2018  . Cognitive and neurobehavioral dysfunction 02/08/2018  . 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 Arthritis of carpometacarpal [facility]) joint of left thumb 01/24/2018  . Ankle fracture,  left 09/03/2016  . Hand dysfunction 03/11/2016  . Symptomatic varicose veins, bilateral 03/11/2016  . Numbness of left thumb 03/11/2016  . Elevated BP 07/27/2014  . Bunion of left foot 07/26/2014  . Abnormality of gait 07/26/2014  . Cough 11/10/2013  . Post-nasal drainage 11/10/2013  . Abnormal urine odor 11/10/2013  . Visit  for preventive health examination 07/28/2013  . Right anterior knee pain 07/22/2012  . Medicare annual wellness visit, subsequent 07/25/2011  . ANXIETY, SITUATIONAL 09/25/2009  . ARTHRITIS 09/25/2009  . Vitamin D deficiency 09/27/2008  . Hyperlipidemia 09/17/2008  . Elevated blood pressure reading without diagnosis of hypertension 09/17/2008  . COLONIC POLYPS, HX OF 09/17/2008  . PERSONAL HISTORY DISEASES SKIN&SUBCUT TISSUE 09/17/2008    Pamela Alvarez W. 10/26/2019, 9:09 PM  Gean Maidens., PT   [facility] [address] [place], [place], [address] Phone: [phone]   Fax:  [phone]  Name: Pamela Alvarez MRN: [id number] Date of Birth: 1940/10/07

## 2019-10-27 ENCOUNTER — Ambulatory Visit: Payer: Medicare PPO | Admitting: Physical Therapy

## 2019-11-07 ENCOUNTER — Encounter: Payer: Medicare PPO | Admitting: Speech Pathology

## 2019-11-07 ENCOUNTER — Ambulatory Visit: Payer: Medicare PPO | Admitting: Speech Pathology

## 2019-11-07 ENCOUNTER — Other Ambulatory Visit: Payer: Self-pay

## 2019-11-07 DIAGNOSIS — R2689 Other abnormalities of gait and mobility: Secondary | ICD-10-CM | POA: Diagnosis not present

## 2019-11-07 DIAGNOSIS — R41841 Cognitive communication deficit: Secondary | ICD-10-CM

## 2019-11-07 DIAGNOSIS — R29818 Other symptoms and signs involving the nervous system: Secondary | ICD-10-CM | POA: Diagnosis not present

## 2019-11-07 DIAGNOSIS — R4701 Aphasia: Secondary | ICD-10-CM | POA: Diagnosis not present

## 2019-11-07 DIAGNOSIS — R41842 Visuospatial deficit: Secondary | ICD-10-CM | POA: Diagnosis not present

## 2019-11-07 DIAGNOSIS — R41844 Frontal lobe and executive function deficit: Secondary | ICD-10-CM | POA: Diagnosis not present

## 2019-11-07 DIAGNOSIS — M6281 Muscle weakness (generalized): Secondary | ICD-10-CM | POA: Diagnosis not present

## 2019-11-07 DIAGNOSIS — R2681 Unsteadiness on feet: Secondary | ICD-10-CM | POA: Diagnosis not present

## 2019-11-07 DIAGNOSIS — R41840 Attention and concentration deficit: Secondary | ICD-10-CM | POA: Diagnosis not present

## 2019-11-07 NOTE — Therapy (Signed)
[facility] [address] [place], [place], [address] Phone: [phone]   Fax:  [phone]  Speech Language Pathology Evaluation  Patient Details  Name: Pamela Alvarez MRN: [id number] Date of Birth: 1940/10/09 Referring Provider (SLP): Dohmeier, Porfirio Mylar, MD   Encounter Date: 11/07/2019   End of Session - 11/07/19 2132    Visit Number 1    Number of Visits 17    Date for SLP Re-Evaluation 02/05/20    SLP Start Time 1618    SLP Stop Time  1700    SLP Time Calculation (min) 42 min    Activity Tolerance Patient tolerated treatment well           Past Medical History:  Diagnosis Date  . Cyst 07-2008   Cyst on back x2 that were drained  . H/O echocardiogram 02-2000  . Hx of colonic polyps   . Hyperlipidemia   . Hypertension   . Varicose veins of both lower extremities     Past Surgical History:  Procedure Laterality Date  . POLYPECTOMY     Colon  . TONSILLECTOMY    . TUBAL LIGATION      There were no vitals filed for this visit.   Subjective Assessment - 11/07/19 1625    Subjective "Awful time."    Patient is accompained by: Family member   spouse             SLP Evaluation [facility] - 11/07/19 2113      SLP Visit Information   SLP Received On 11/07/19    Referring Provider (SLP) Dohmeier, Porfirio Mylar, MD    Onset Date referral 10/18/19   speech/cognitive difficulties since late 2017   Medical Diagnosis PSP      Subjective   Patient/Family Stated Goal "get her back to where she was when she finished speech therapy"      Pain Assessment   Currently in Pain? No/denies      General Information   HPI Pamela Alvarez is a 79 year old female known to our clinic from previous course of ST. Referred by Dr. Vickey Huger; has gotten diagnosis of PSP since last seen by SLP (Jan 2021). At time of d/c pt was participating in 5-8 minute simple conversation with partner support.     Behavioral/Cognition alert, pleasant,  cooperative    Mobility Status arrived in wheelchair      Balance Screen   Has the patient fallen in the past 6 months --   currently seeing PT     Prior Functional Status   Cognitive/Linguistic Baseline Baseline deficits    Baseline deficit details "severe" non-linguistic cognitive deficits per CLQT, moderate language deficits    Type of Home House     Lives With Spouse    Available Support Family    Education [name]    Vocation Retired      IT consultant   Overall Cognitive Status History of cognitive impairments - at baseline   not formally assessed; slow processing, attention deficits   Attention Sustained    Sustained Attention Impaired      Auditory Comprehension   Overall Auditory Comprehension Impaired   extra time, pausing, chunking information provided   Yes/No Questions Impaired   inconsistent verbally; gesture improves accuracy   Conversation Simple    Other Conversation Comments very slow processing    Interfering Components Attention;Processing speed;Visual impairments      Visual Recognition/Discrimination   Discrimination Not tested      Reading Comprehension  Reading Status Not tested   impaired at word/phrase level, not formally assessed     Expression   Primary Mode of Expression Verbal      Verbal Expression   Overall Verbal Expression Impaired at baseline    Initiation Impaired    Automatic Speech Name;Social Response    Level of Generative/Spontaneous Verbalization Sentence    Repetition Impaired    Level of Impairment Phrase level    Naming Impairment    Confrontation 50-74% accurate   BNT 10/15   Interfering Components Attention    Effective Techniques Open ended questions;Semantic cues;Sentence completion    Non-Verbal Means of Communication Gestures      Written Expression   Dominant Hand Right    Written Expression Not tested      Oral Motor/Sensory Function   Overall Oral Motor/Sensory Function Other (comment)   not formally assessed due to  masking     Motor Speech   Overall Motor Speech Impaired    Respiration Within functional limits    Articulation Impaired    Level of Impairment Word    Intelligibility --   intermittent distortions, sound substitutions   Motor Speech Errors Unaware;Aware    Phonation [facility]      Standardized Assessments   Standardized Assessments  [place] Naming Test-2nd edition   short form 10/15                          SLP Education - 11/07/19 2132    Education Details communication modalities, AAC device trial    Person(s) Educated Patient;Spouse    Methods Explanation    Comprehension Verbalized understanding;Need further instruction            SLP Short Term Goals - 11/07/19 2138      SLP SHORT TERM GOAL #1   Title pt will relay simple biographical information using AAC if necessary with mod cues x 3 visits.    Time 4    Period Weeks    Status New      SLP SHORT TERM GOAL #2   Title pt will respond to simple questions re: preferences, using AAC if necessary, with mod cues x3 visits.    Time 4    Period Weeks    Status New      SLP SHORT TERM GOAL #3   Title Pt will ask an appropriate question in simple communication exchange, using AAC if necessary, in 2 sessions.    Time 4    Period Weeks    Status New            SLP Long Term Goals - 11/07/19 2144      SLP LONG TERM GOAL #1   Title pt/spouse will report improved communication abilities than prior to beginning ST    Time 8    Period Weeks    Status New      SLP LONG TERM GOAL #2   Title Pt/spouse will report using AAC/supportive conversation strategies to resolve communication breakdown outside ST x 3 visits.    Time 8    Period Weeks    Status New      SLP LONG TERM GOAL #3   Title Pt will make simple requests, using AAC if necessary, outside ST room x 3 sessions    Time 8   renewed 11/24   Period Weeks    Status New   omitted "over three sessions"     SLP LONG TERM  GOAL #4   Time --    renewed 11/24     SLP LONG TERM GOAL #5   Time --   renewed 11/24           Plan - 11/07/19 2134    Clinical Impression Statement Pamela Alvarez presents with moderate aphasia and severe cognitive deficits secondary to PSP, recently diagnosed. Pt/husband report decline in communication abilities over the past 8 months since completing previous ST course and are interested in pursuing AAC to enhance/supplement communication. I recommend skilled ST to train pt and spouse in compensations, strategies to maximize communication abilities and educate on appropriate cognitive-linguistic activities to maintain function.    Speech Therapy Frequency 2x / week    Duration --   8 weeks or 17 visits   Treatment/Interventions SLP instruction and feedback;Compensatory strategies;Patient/family education;Multimodal communcation approach;Cueing hierarchy;Language facilitation;Functional tasks;Oral motor exercises;Internal/external aids;Environmental controls;Cognitive reorganization   AAC   Potential to Achieve Goals Fair    Potential Considerations Severity of impairments           Patient will benefit from skilled therapeutic intervention in order to improve the following deficits and impairments:   Aphasia  Cognitive communication deficit    Problem List Patient Active Problem List   Diagnosis Date Noted  . Progressive supranuclear palsy (HCC) 10/12/2019  . Encephalomalacia on imaging study 10/12/2019  . Fluency disorder associated with underlying disease 02/08/2018  . Cognitive and neurobehavioral dysfunction 02/08/2018  . 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 Arthritis of carpometacarpal [facility]) joint of left thumb 01/24/2018  . Ankle fracture, left 09/03/2016  . Hand dysfunction 03/11/2016  . Symptomatic varicose veins, bilateral 03/11/2016  . Numbness of left thumb 03/11/2016  . Elevated BP 07/27/2014  . Bunion of left foot 07/26/2014  . Abnormality of gait 07/26/2014  . Cough  11/10/2013  . Post-nasal drainage 11/10/2013  . Abnormal urine odor 11/10/2013  . Visit for preventive health examination 07/28/2013  . Right anterior knee pain 07/22/2012  . Medicare annual wellness visit, subsequent 07/25/2011  . ANXIETY, SITUATIONAL 09/25/2009  . ARTHRITIS 09/25/2009  . Vitamin D deficiency 09/27/2008  . Hyperlipidemia 09/17/2008  . Elevated blood pressure reading without diagnosis of hypertension 09/17/2008  . COLONIC POLYPS, HX OF 09/17/2008  . PERSONAL HISTORY DISEASES SKIN&SUBCUT TISSUE 09/17/2008   Rondel Baton, MS, CCC-SLP Speech-Language Pathologist  Arlana Lindau 11/07/2019, 9:47 PM  [place] [facility] [address] [place], [place], [address] Phone: [phone]   Fax:  [phone]  Name: Pamela Alvarez MRN: [id number] Date of Birth: [birth year]

## 2019-11-08 ENCOUNTER — Encounter: Payer: Self-pay | Admitting: Physical Therapy

## 2019-11-08 ENCOUNTER — Ambulatory Visit: Payer: Medicare PPO | Attending: Neurology | Admitting: Physical Therapy

## 2019-11-08 DIAGNOSIS — R41842 Visuospatial deficit: Secondary | ICD-10-CM | POA: Insufficient documentation

## 2019-11-08 DIAGNOSIS — R278 Other lack of coordination: Secondary | ICD-10-CM | POA: Diagnosis present

## 2019-11-08 DIAGNOSIS — R29818 Other symptoms and signs involving the nervous system: Secondary | ICD-10-CM | POA: Insufficient documentation

## 2019-11-08 DIAGNOSIS — R41840 Attention and concentration deficit: Secondary | ICD-10-CM | POA: Insufficient documentation

## 2019-11-08 DIAGNOSIS — R2689 Other abnormalities of gait and mobility: Secondary | ICD-10-CM | POA: Diagnosis not present

## 2019-11-08 DIAGNOSIS — R41841 Cognitive communication deficit: Secondary | ICD-10-CM | POA: Diagnosis not present

## 2019-11-08 DIAGNOSIS — R2681 Unsteadiness on feet: Secondary | ICD-10-CM | POA: Insufficient documentation

## 2019-11-08 DIAGNOSIS — R41844 Frontal lobe and executive function deficit: Secondary | ICD-10-CM | POA: Diagnosis not present

## 2019-11-08 DIAGNOSIS — M6281 Muscle weakness (generalized): Secondary | ICD-10-CM | POA: Insufficient documentation

## 2019-11-08 DIAGNOSIS — R4701 Aphasia: Secondary | ICD-10-CM | POA: Insufficient documentation

## 2019-11-08 NOTE — Therapy (Signed)
[facility] [address] [place], [place], [address] Phone: [phone]   Fax:  [phone]  Physical Therapy Treatment  Patient Details  Name: Pamela Alvarez MRN: [id number] Date of Birth: [birth year] Referring Provider (PT): Dohmeier, Porfirio Mylar   Encounter Date: 11/08/2019   PT End of Session - 11/08/19 1208    Visit Number 3    Number of Visits 25    Date for PT Re-Evaluation 01/21/20    Authorization Type [employer] Medicare-submitted auth at completion of eval    Progress Note Due on Visit 10    PT Start Time 1104    PT Stop Time 1145    PT Time Calculation (min) 41 min    Equipment Utilized During Treatment Gait belt    Activity Tolerance Patient tolerated treatment well    Behavior During Therapy Flat affect           Past Medical History:  Diagnosis Date  . Cyst 07-2008   Cyst on back x2 that were drained  . H/O echocardiogram 02-2000  . Hx of colonic polyps   . Hyperlipidemia   . Hypertension   . Varicose veins of both lower extremities     Past Surgical History:  Procedure Laterality Date  . POLYPECTOMY     Colon  . TONSILLECTOMY    . TUBAL LIGATION      There were no vitals filed for this visit.   Subjective Assessment - 11/08/19 1110    Subjective Reports car transfer was better today.  Reports what Amy showed him helped with transfers.  Still looking at lifts for the car.    Patient is accompained by: Family member   Husband-provides history   Patient Stated Goals To try to maintain as much as possible, try to prevent regression as much as possible.    Currently in Pain? No/denies                  [facility] Adult PT Treatment/Exercise - 11/08/19 0001      Transfers   Transfers Sit to Stand;Stand to Sit    Sit to Stand 4: Min assist;3: Mod assist    Sit to Stand Details Verbal cues for sequencing;Verbal cues for technique;Tactile cues for placement;Tactile cues for initiation    Stand to  Sit 3: Mod assist    Stand to Sit Details (indicate cue type and reason) Tactile cues for sequencing;Tactile cues for weight shifting;Verbal cues for sequencing;Verbal cues for precautions/safety;Verbal cues for technique    Stand to Sit Details cues to turn to chair/surface completely along with controlling descent.  Needs atleast min assist to contol.    Number of Reps 10 reps;2 sets    Transfer Cueing cues to turn completely before sitting and again to control descent    Comments needs cues consistently for technique with all aspects of sit<>stand and transfer to perform safely/correctly.      Ambulation/Gait   Ambulation/Gait Yes    Ambulation/Gait Assistance 4: Min assist    Ambulation/Gait Assistance Details cues for increased step length bil (L step length less than right)  Cues to stay close to rollator and safety with turns    Ambulation Distance (Feet) 65 Feet   x1 with rollator and 30' x 1 with bil HHA   Assistive device Rollator    Gait Pattern Step-through pattern;Step-to pattern;Decreased step length - right;Decreased step length - left;Decreased stride length;Decreased dorsiflexion - right;Decreased dorsiflexion - left;Trunk flexed;Poor foot clearance - left;Poor foot clearance -  right    Ambulation Surface Level;Indoor    Gait velocity significant decrease today per husband;states she was walking much better at home earlier today      Posture/Postural Control   Posture/Postural Control Postural limitations    Postural Limitations Forward head;Rounded Shoulders;Flexed trunk      Therapeutic Activites    Therapeutic Activities Other Therapeutic Activities   Seated bil LE stepping in/out to simulate car transfer   Other Therapeutic Activities Used black foam mat for visual cues to lift leg higher to clear mat.  Performed 10 reps bil x 2 sets      Exercises   Exercises Knee/Hip      Knee/Hip Exercises: Aerobic   Nustep All 4 extremities at workload 1 x 8 minutes with 30  second rest break with steps per minute>60      Knee/Hip Exercises: Standing   Hip Flexion AROM;Stengthening;Both;2 sets;10 reps;Knee bent                    PT Short Term Goals - 10/25/19 0732      PT SHORT TERM GOAL #1   Title Pt will perform HEP with family supervision for improved strength, balance, transfers, and gait.  TARGET 11/17/2019    Time 4    Period Weeks    Status New      PT SHORT TERM GOAL #2   Title Pt will perform  4 of 5 trials of sit to stand with minimal assistance.    Baseline min/mod assist and posterior lean    Time 4    Period Weeks    Status New      PT SHORT TERM GOAL #3   Title Pt will improve TUG score to less than or equal to 45 seconds for decreased fall risk.    Baseline 53.69 sec    Time 4    Period Weeks    Status New      PT SHORT TERM GOAL #4   Title Pt will ambulate at least 2 minutes of continuous gait with rollator and min assist for improved gait endurance and mobility in her home.    Baseline fatigues at approx 1 min of gait    Time 4    Period Weeks    Status New             PT Long Term Goals - 10/25/19 0735      PT LONG TERM GOAL #1   Title Pt/husband will verbalize and demonstrate understanding of final HEP to maintain functional gains made in therapy.  TARGET 01/12/2020    Time 12    Period Weeks    Status New      PT LONG TERM GOAL #2   Title Pt will perform TUG shuttle activity with rollator and min guard, at least 3 sets without break, to demonstrate improved transfer efficiency and safety.    Time 12    Period Weeks    Status New      PT LONG TERM GOAL #3   Title Pt will improve TUG score to less than or equal to 30 seconds for decreased fall risk/improved mobility in the home.    Time 12    Status New      PT LONG TERM GOAL #4   Title Pt/husband will verbalize understanding of fall prevention in home environment.    Time 12    Period Weeks    Status New      PT  LONG TERM GOAL #5   Title Pt  will improve gait velocity to at least 1.1 ft/sec for improved gait efficiency and safety in the home.    Time 12    Period Weeks    Status New                 Plan - 11/08/19 1407    Clinical Impression Statement Pt continues to need cues for safety with all aspects of mobility.  Continues with decreased awareness and decreased strength on L side impacting mobility as well.  Husband supportive.  Pt tolerated Nustep well today although it's "not her favorite" thing to do.  Cont PT per poc.    Personal Factors and Comorbidities Comorbidity 3+;Other    Comorbidities See problem list; recent diagnosis of PSP    Examination-Activity Limitations Locomotion Level;Transfers;Dressing;Stairs;Stand;Toileting    Examination-Participation Restrictions Community Activity;Shop    Stability/Clinical Decision Making Evolving/Moderate complexity    Rehab Potential Good    PT Frequency 2x / week    PT Duration 12 weeks   including eval week   PT Treatment/Interventions ADLs/Self Care Home Management;Gait training;Functional mobility training;Therapeutic activities;Therapeutic exercise;Balance training;Neuromuscular re-education;Patient/family education    PT Next Visit Plan use of aerobic machine for aerobic activity; work on step out/in with each leg in sitting/standing for assist with car transfer.  look at previous HEP and update as apropriate.    Consulted and Agree with Plan of Care Patient;Family member/caregiver    Family Member Consulted Husband           Patient will benefit from skilled therapeutic intervention in order to improve the following deficits and impairments:  Abnormal gait, Difficulty walking, Decreased safety awareness, Decreased endurance, Decreased activity tolerance, Decreased balance, Decreased mobility, Decreased strength, Postural dysfunction  Visit Diagnosis: Unsteadiness on feet  Other abnormalities of gait and mobility  Muscle weakness  (generalized)     Problem List Patient Active Problem List   Diagnosis Date Noted  . Progressive supranuclear palsy (HCC) 10/12/2019  . Encephalomalacia on imaging study 10/12/2019  . Fluency disorder associated with underlying disease 02/08/2018  . Cognitive and neurobehavioral dysfunction 02/08/2018  . 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 Arthritis of carpometacarpal [facility]) joint of left thumb 01/24/2018  . Ankle fracture, left 09/03/2016  . Hand dysfunction 03/11/2016  . Symptomatic varicose veins, bilateral 03/11/2016  . Numbness of left thumb 03/11/2016  . Elevated BP 07/27/2014  . Bunion of left foot 07/26/2014  . Abnormality of gait 07/26/2014  . Cough 11/10/2013  . Post-nasal drainage 11/10/2013  . Abnormal urine odor 11/10/2013  . Visit for preventive health examination 07/28/2013  . Right anterior knee pain 07/22/2012  . Medicare annual wellness visit, subsequent 07/25/2011  . ANXIETY, SITUATIONAL 09/25/2009  . ARTHRITIS 09/25/2009  . Vitamin D deficiency 09/27/2008  . Hyperlipidemia 09/17/2008  . Elevated blood pressure reading without diagnosis of hypertension 09/17/2008  . COLONIC POLYPS, HX OF 09/17/2008  . PERSONAL HISTORY DISEASES SKIN&SUBCUT TISSUE 09/17/2008    Newell Coral, PTA [facility] 11/08/19 2:13 PM Phone: [phone] Fax: [phone]   [facility] [address] [place], [place], [address] Phone: [phone]   Fax:  [phone]  Name: Pamela Alvarez MRN: [id number] Date of Birth: [birth year]

## 2019-11-10 ENCOUNTER — Other Ambulatory Visit: Payer: Self-pay

## 2019-11-10 ENCOUNTER — Ambulatory Visit: Payer: Medicare PPO | Admitting: Physical Therapy

## 2019-11-10 ENCOUNTER — Encounter: Payer: Self-pay | Admitting: Physical Therapy

## 2019-11-10 DIAGNOSIS — R41844 Frontal lobe and executive function deficit: Secondary | ICD-10-CM | POA: Diagnosis not present

## 2019-11-10 DIAGNOSIS — R41842 Visuospatial deficit: Secondary | ICD-10-CM | POA: Diagnosis not present

## 2019-11-10 DIAGNOSIS — R2681 Unsteadiness on feet: Secondary | ICD-10-CM | POA: Diagnosis not present

## 2019-11-10 DIAGNOSIS — R2689 Other abnormalities of gait and mobility: Secondary | ICD-10-CM | POA: Diagnosis not present

## 2019-11-10 DIAGNOSIS — M6281 Muscle weakness (generalized): Secondary | ICD-10-CM

## 2019-11-10 DIAGNOSIS — R41841 Cognitive communication deficit: Secondary | ICD-10-CM | POA: Diagnosis not present

## 2019-11-10 DIAGNOSIS — R29818 Other symptoms and signs involving the nervous system: Secondary | ICD-10-CM | POA: Diagnosis not present

## 2019-11-10 DIAGNOSIS — R41840 Attention and concentration deficit: Secondary | ICD-10-CM | POA: Diagnosis not present

## 2019-11-10 DIAGNOSIS — R4701 Aphasia: Secondary | ICD-10-CM | POA: Diagnosis not present

## 2019-11-10 NOTE — Therapy (Signed)
[facility] [address] [place], [place], [address] Phone: [phone]   Fax:  [phone]  Physical Therapy Treatment  Patient Details  Name: Pamela Alvarez MRN: [id number] Date of Birth: 1940-07-15 Referring Provider (PT): Dohmeier, Porfirio Mylar   Encounter Date: 11/10/2019   PT End of Session - 11/10/19 1119    Visit Number 4    Number of Visits 25    Date for PT Re-Evaluation 01/21/20    Authorization Type [employer] Medicare-submitted auth at completion of eval    Progress Note Due on Visit 10    PT Start Time 1108    PT Stop Time 1147    PT Time Calculation (min) 39 min    Equipment Utilized During Treatment Gait belt    Activity Tolerance Patient tolerated treatment well    Behavior During Therapy Flat affect           Past Medical History:  Diagnosis Date  . Cyst 07-2008   Cyst on back x2 that were drained  . H/O echocardiogram 02-2000  . Hx of colonic polyps   . Hyperlipidemia   . Hypertension   . Varicose veins of both lower extremities     Past Surgical History:  Procedure Laterality Date  . POLYPECTOMY     Colon  . TONSILLECTOMY    . TUBAL LIGATION      There were no vitals filed for this visit.   Subjective Assessment - 11/10/19 1115    Subjective Denies any falls or changes.  Pt denies being sore after last session.    Patient is accompained by: Family member   Husband-provides history   Patient Stated Goals To try to maintain as much as possible, try to prevent regression as much as possible.    Currently in Pain? No/denies                  [facility] Adult PT Treatment/Exercise - 11/10/19 0001      Transfers   Transfers Sit to Stand;Stand to Sit    Sit to Stand 4: Min assist    Sit to Stand Details Verbal cues for sequencing;Verbal cues for technique;Tactile cues for placement;Tactile cues for initiation    Stand to Sit 4: Min assist;3: Mod assist    Stand to Sit Details (indicate cue  type and reason) Tactile cues for sequencing;Tactile cues for weight shifting;Verbal cues for sequencing;Verbal cues for precautions/safety;Verbal cues for technique    Stand to Sit Details continues to attempt to sit before close to chair and with backward lean.  Varied today from min-mod assist    Number of Reps Other reps (comment)   multiple times for various activities   Transfer Cueing cues again to turn completely      Ambulation/Gait   Ambulation/Gait Yes    Ambulation/Gait Assistance 4: Min assist    Ambulation/Gait Assistance Details cueing for increased step length bil but L tends to have decreased step length than R.  Assist to remain forward vs posterior lean    Ambulation Distance (Feet) 60 Feet   x 1 and 40 x 4   Assistive device Rollator;Other (Comment)   vs HHA   Gait Pattern Step-through pattern;Step-to pattern;Decreased step length - right;Decreased step length - left;Decreased stride length;Decreased dorsiflexion - right;Decreased dorsiflexion - left;Trunk flexed;Poor foot clearance - left;Poor foot clearance - right    Ambulation Surface Level;Indoor      Neuro Re-ed    Neuro Re-ed Details  in // bars for  forward<>backward ambulation with bil rails vs HHA working on step length.  Taps to disks in floor alternating legs then to bubble disks alternating legs.  Single leg stance x 5 seconds alternating legs with bil UE support progressing to 10 seconds.  Pt need verbal and tactile cues to remain on single leg for time.  Pt needs max cues with all activities.        Exercises   Exercises Knee/Hip      Knee/Hip Exercises: Aerobic   Nustep All 4 extremities at workload 1 x 8 minutes with 30 second rest break with steps per minute>60                    PT Short Term Goals - 10/25/19 0732      PT SHORT TERM GOAL #1   Title Pt will perform HEP with family supervision for improved strength, balance, transfers, and gait.  TARGET 11/17/2019    Time 4    Period Weeks      Status New      PT SHORT TERM GOAL #2   Title Pt will perform  4 of 5 trials of sit to stand with minimal assistance.    Baseline min/mod assist and posterior lean    Time 4    Period Weeks    Status New      PT SHORT TERM GOAL #3   Title Pt will improve TUG score to less than or equal to 45 seconds for decreased fall risk.    Baseline 53.69 sec    Time 4    Period Weeks    Status New      PT SHORT TERM GOAL #4   Title Pt will ambulate at least 2 minutes of continuous gait with rollator and min assist for improved gait endurance and mobility in her home.    Baseline fatigues at approx 1 min of gait    Time 4    Period Weeks    Status New             PT Long Term Goals - 10/25/19 0735      PT LONG TERM GOAL #1   Title Pt/husband will verbalize and demonstrate understanding of final HEP to maintain functional gains made in therapy.  TARGET 01/12/2020    Time 12    Period Weeks    Status New      PT LONG TERM GOAL #2   Title Pt will perform TUG shuttle activity with rollator and min guard, at least 3 sets without break, to demonstrate improved transfer efficiency and safety.    Time 12    Period Weeks    Status New      PT LONG TERM GOAL #3   Title Pt will improve TUG score to less than or equal to 30 seconds for decreased fall risk/improved mobility in the home.    Time 12    Status New      PT LONG TERM GOAL #4   Title Pt/husband will verbalize understanding of fall prevention in home environment.    Time 12    Period Weeks    Status New      PT LONG TERM GOAL #5   Title Pt will improve gait velocity to at least 1.1 ft/sec for improved gait efficiency and safety in the home.    Time 12    Period Weeks    Status New  Plan - 11/10/19 1210    Clinical Impression Statement Continue to work on activities to encourage step length bil (especially L) and safety and endurance.  Pt needs constant cues for safety and technique and is  significant fall risk.  cont per poc.    Personal Factors and Comorbidities Comorbidity 3+;Other    Comorbidities See problem list; recent diagnosis of PSP    Examination-Activity Limitations Locomotion Level;Transfers;Dressing;Stairs;Stand;Toileting    Examination-Participation Restrictions Community Activity;Shop    Stability/Clinical Decision Making Evolving/Moderate complexity    Rehab Potential Good    PT Frequency 2x / week    PT Duration 12 weeks   including eval week   PT Treatment/Interventions ADLs/Self Care Home Management;Gait training;Functional mobility training;Therapeutic activities;Therapeutic exercise;Balance training;Neuromuscular re-education;Patient/family education    PT Next Visit Plan use of aerobic machine for aerobic activity; work on step out/in with each leg in sitting/standing for assist with car transfer.  Amy-it sounds like she has a good HEP that her husband helps her with from previous bout of therapy but you can ask him to see if you feel like it is sufficient.  As you know she does better with consistency so I was hesitant to make any changes.    Consulted and Agree with Plan of Care Patient;Family member/caregiver    Family Member Consulted Husband           Patient will benefit from skilled therapeutic intervention in order to improve the following deficits and impairments:  Abnormal gait, Difficulty walking, Decreased safety awareness, Decreased endurance, Decreased activity tolerance, Decreased balance, Decreased mobility, Decreased strength, Postural dysfunction  Visit Diagnosis: Unsteadiness on feet  Other abnormalities of gait and mobility  Muscle weakness (generalized)     Problem List Patient Active Problem List   Diagnosis Date Noted  . Progressive supranuclear palsy (HCC) 10/12/2019  . Encephalomalacia on imaging study 10/12/2019  . Fluency disorder associated with underlying disease 02/08/2018  . Cognitive and neurobehavioral  dysfunction 02/08/2018  . 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 Arthritis of carpometacarpal [facility]) joint of left thumb 01/24/2018  . Ankle fracture, left 09/03/2016  . Hand dysfunction 03/11/2016  . Symptomatic varicose veins, bilateral 03/11/2016  . Numbness of left thumb 03/11/2016  . Elevated BP 07/27/2014  . Bunion of left foot 07/26/2014  . Abnormality of gait 07/26/2014  . Cough 11/10/2013  . Post-nasal drainage 11/10/2013  . Abnormal urine odor 11/10/2013  . Visit for preventive health examination 07/28/2013  . Right anterior knee pain 07/22/2012  . Medicare annual wellness visit, subsequent 07/25/2011  . ANXIETY, SITUATIONAL 09/25/2009  . ARTHRITIS 09/25/2009  . Vitamin D deficiency 09/27/2008  . Hyperlipidemia 09/17/2008  . Elevated blood pressure reading without diagnosis of hypertension 09/17/2008  . COLONIC POLYPS, HX OF 09/17/2008  . PERSONAL HISTORY DISEASES SKIN&SUBCUT TISSUE 09/17/2008    Newell Coral, PTA [facility] 11/10/19 12:20 PM Phone: [phone] Fax: [phone]   [facility] [address] [place], [place], [address] Phone: [phone]   Fax:  [phone]  Name: Pamela Alvarez MRN: [id number] Date of Birth: 1940/05/19

## 2019-11-16 ENCOUNTER — Ambulatory Visit: Payer: Medicare PPO | Admitting: Physical Therapy

## 2019-11-16 ENCOUNTER — Other Ambulatory Visit: Payer: Self-pay

## 2019-11-16 DIAGNOSIS — R41842 Visuospatial deficit: Secondary | ICD-10-CM | POA: Diagnosis not present

## 2019-11-16 DIAGNOSIS — M6281 Muscle weakness (generalized): Secondary | ICD-10-CM

## 2019-11-16 DIAGNOSIS — R29818 Other symptoms and signs involving the nervous system: Secondary | ICD-10-CM | POA: Diagnosis not present

## 2019-11-16 DIAGNOSIS — R41844 Frontal lobe and executive function deficit: Secondary | ICD-10-CM | POA: Diagnosis not present

## 2019-11-16 DIAGNOSIS — R4701 Aphasia: Secondary | ICD-10-CM | POA: Diagnosis not present

## 2019-11-16 DIAGNOSIS — R41841 Cognitive communication deficit: Secondary | ICD-10-CM | POA: Diagnosis not present

## 2019-11-16 DIAGNOSIS — R41840 Attention and concentration deficit: Secondary | ICD-10-CM | POA: Diagnosis not present

## 2019-11-16 DIAGNOSIS — R2681 Unsteadiness on feet: Secondary | ICD-10-CM | POA: Diagnosis not present

## 2019-11-16 DIAGNOSIS — R2689 Other abnormalities of gait and mobility: Secondary | ICD-10-CM | POA: Diagnosis not present

## 2019-11-16 NOTE — Therapy (Signed)
[facility] [address] [place], [place], [address] Phone: [phone]   Fax:  [phone]  Physical Therapy Treatment  Patient Details  Name: Pamela Alvarez MRN: [id number] Date of Birth: [birth year] Referring Provider (PT): Dohmeier, Porfirio Mylar   Encounter Date: 11/16/2019   PT End of Session - 11/16/19 1442    Visit Number 5    Number of Visits 25    Date for PT Re-Evaluation 01/21/20    Authorization Type [employer] Medicare-submitted auth at completion of eval    Progress Note Due on Visit 10    PT Start Time 1233    PT Stop Time 1314    PT Time Calculation (min) 41 min    Equipment Utilized During Treatment Gait belt    Activity Tolerance Patient tolerated treatment well    Behavior During Therapy Flat affect           Past Medical History:  Diagnosis Date  . Cyst 07-2008   Cyst on back x2 that were drained  . H/O echocardiogram 02-2000  . Hx of colonic polyps   . Hyperlipidemia   . Hypertension   . Varicose veins of both lower extremities     Past Surgical History:  Procedure Laterality Date  . POLYPECTOMY     Colon  . TONSILLECTOMY    . TUBAL LIGATION      There were no vitals filed for this visit.   Subjective Assessment - 11/16/19 1235    Subjective Think the car transfers are a little better.    Patient is accompained by: Family member   Husband-provides history   Patient Stated Goals To try to maintain as much as possible, try to prevent regression as much as possible.    Currently in Pain? No/denies                             [facility] Adult PT Treatment/Exercise - 11/16/19 0001      Transfers   Transfers Sit to Stand;Stand to Sit    Sit to Stand 4: Min assist    Sit to Stand Details Verbal cues for sequencing;Verbal cues for technique;Tactile cues for placement;Tactile cues for initiation    Stand to Sit 4: Min assist;3: Mod assist    Stand to Sit Details (indicate cue type  and reason) Tactile cues for sequencing;Tactile cues for weight shifting;Verbal cues for sequencing;Verbal cues for precautions/safety;Verbal cues for technique    Stand to Sit Details PT provides verbal cues and tactile cues to fully turn to sit.    Number of Reps Other reps (comment)   multiple times for various activities   Comments Husband pleased at end of session, decreased posterior lean noted with transfers toward end of session.      Ambulation/Gait   Ambulation/Gait Yes    Ambulation/Gait Assistance 4: Min assist    Ambulation/Gait Assistance Details Cues to increase step length; pt takes increased step length RLE vs LLE.    Ambulation Distance (Feet) 75 Feet   25, 60 ft; 40 ft   Assistive device Rollator;Other (Comment)    Gait Pattern Step-through pattern;Step-to pattern;Decreased step length - right;Decreased step length - left;Decreased stride length;Decreased dorsiflexion - right;Decreased dorsiflexion - left;Trunk flexed;Poor foot clearance - left;Poor foot clearance - right    Ambulation Surface Level;Indoor    Pre-Gait Activities Pt requires a seated rest break due to c/o fatigue in BUEs.      Neuro Re-ed  Neuro Re-ed Details  Standing beside counter:  step taps to flat balance disks x 10 reps each side (when performing with LLE, pt requires HHA on LUE.  Side step taps, facing counter with BUE support, step taps to flat balance disks, 2 sets x 10 reps (pt externally rotates more with LLE).  Marching in place, 3 sets x 5 reps with use of Boom whacker for visual target for increased height of marching.  Attempted single limb stance, 2-3 seconds each leg with BUE support, x 5 reps each.      Knee/Hip Exercises: Seated   Other Seated Knee/Hip Exercises Seated step out and in, 2 sets x 10 reps, with initial assistance.  2nd set tapping to circle disk targets           Extra time and extra cues provided throughout.         PT Short Term Goals - 10/25/19 0732      PT  SHORT TERM GOAL #1   Title Pt will perform HEP with family supervision for improved strength, balance, transfers, and gait.  TARGET 11/17/2019    Time 4    Period Weeks    Status New      PT SHORT TERM GOAL #2   Title Pt will perform  4 of 5 trials of sit to stand with minimal assistance.    Baseline min/mod assist and posterior lean    Time 4    Period Weeks    Status New      PT SHORT TERM GOAL #3   Title Pt will improve TUG score to less than or equal to 45 seconds for decreased fall risk.    Baseline 53.69 sec    Time 4    Period Weeks    Status New      PT SHORT TERM GOAL #4   Title Pt will ambulate at least 2 minutes of continuous gait with rollator and min assist for improved gait endurance and mobility in her home.    Baseline fatigues at approx 1 min of gait    Time 4    Period Weeks    Status New             PT Long Term Goals - 10/25/19 0735      PT LONG TERM GOAL #1   Title Pt/husband will verbalize and demonstrate understanding of final HEP to maintain functional gains made in therapy.  TARGET 01/12/2020    Time 12    Period Weeks    Status New      PT LONG TERM GOAL #2   Title Pt will perform TUG shuttle activity with rollator and min guard, at least 3 sets without break, to demonstrate improved transfer efficiency and safety.    Time 12    Period Weeks    Status New      PT LONG TERM GOAL #3   Title Pt will improve TUG score to less than or equal to 30 seconds for decreased fall risk/improved mobility in the home.    Time 12    Status New      PT LONG TERM GOAL #4   Title Pt/husband will verbalize understanding of fall prevention in home environment.    Time 12    Period Weeks    Status New      PT LONG TERM GOAL #5   Title Pt will improve gait velocity to at least 1.1 ft/sec for improved gait efficiency and safety  in the home.    Time 12    Period Weeks    Status New                 Plan - 11/16/19 1443    Clinical Impression  Statement Pt does well today with hand over hand in conjunction with verbal cues for transfer sequencing and standing exercises.  She reports fatigue with gait, so gait with rollator limited to shorter distances.  PT and husband both note towards end of session decreased posterior lean, with improved ease of transfrs.    Personal Factors and Comorbidities Comorbidity 3+;Other    Comorbidities See problem list; recent diagnosis of PSP    Examination-Activity Limitations Locomotion Level;Transfers;Dressing;Stairs;Stand;Toileting    Examination-Participation Restrictions Community Activity;Shop    Stability/Clinical Decision Making Evolving/Moderate complexity    Rehab Potential Good    PT Frequency 2x / week    PT Duration 12 weeks   including eval week   PT Treatment/Interventions ADLs/Self Care Home Management;Gait training;Functional mobility training;Therapeutic activities;Therapeutic exercise;Balance training;Neuromuscular re-education;Patient/family education    PT Next Visit Plan use of aerobic machine for aerobic activity; work on step out/in with each leg in sitting/standing for assist with car transfer.  Check STGs next week    Consulted and Agree with Plan of Care Patient;Family member/caregiver    Family Member Consulted Husband           Patient will benefit from skilled therapeutic intervention in order to improve the following deficits and impairments:  Abnormal gait, Difficulty walking, Decreased safety awareness, Decreased endurance, Decreased activity tolerance, Decreased balance, Decreased mobility, Decreased strength, Postural dysfunction  Visit Diagnosis: Other abnormalities of gait and mobility  Unsteadiness on feet  Muscle weakness (generalized)     Problem List Patient Active Problem List   Diagnosis Date Noted  . Progressive supranuclear palsy (HCC) 10/12/2019  . Encephalomalacia on imaging study 10/12/2019  . Fluency disorder associated with underlying  disease 02/08/2018  . Cognitive and neurobehavioral dysfunction 02/08/2018  . 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 Arthritis of carpometacarpal [facility]) joint of left thumb 01/24/2018  . Ankle fracture, left 09/03/2016  . Hand dysfunction 03/11/2016  . Symptomatic varicose veins, bilateral 03/11/2016  . Numbness of left thumb 03/11/2016  . Elevated BP 07/27/2014  . Bunion of left foot 07/26/2014  . Abnormality of gait 07/26/2014  . Cough 11/10/2013  . Post-nasal drainage 11/10/2013  . Abnormal urine odor 11/10/2013  . Visit for preventive health examination 07/28/2013  . Right anterior knee pain 07/22/2012  . Medicare annual wellness visit, subsequent 07/25/2011  . ANXIETY, SITUATIONAL 09/25/2009  . ARTHRITIS 09/25/2009  . Vitamin D deficiency 09/27/2008  . Hyperlipidemia 09/17/2008  . Elevated blood pressure reading without diagnosis of hypertension 09/17/2008  . COLONIC POLYPS, HX OF 09/17/2008  . PERSONAL HISTORY DISEASES SKIN&SUBCUT TISSUE 09/17/2008    Gean Maidens. 11/16/2019, 3:01 PM  Gean Maidens., PT   [facility] [address] [place], [place], [address] Phone: [phone]   Fax:  [phone]  Name: Pamela Alvarez MRN: [id number] Date of Birth: [birth year]

## 2019-11-21 ENCOUNTER — Ambulatory Visit: Payer: Medicare PPO | Admitting: Speech Pathology

## 2019-11-21 ENCOUNTER — Ambulatory Visit: Payer: Medicare PPO | Admitting: Physical Therapy

## 2019-11-21 ENCOUNTER — Other Ambulatory Visit: Payer: Self-pay

## 2019-11-21 DIAGNOSIS — R2689 Other abnormalities of gait and mobility: Secondary | ICD-10-CM | POA: Diagnosis not present

## 2019-11-21 DIAGNOSIS — R41844 Frontal lobe and executive function deficit: Secondary | ICD-10-CM | POA: Diagnosis not present

## 2019-11-21 DIAGNOSIS — M6281 Muscle weakness (generalized): Secondary | ICD-10-CM | POA: Diagnosis not present

## 2019-11-21 DIAGNOSIS — R2681 Unsteadiness on feet: Secondary | ICD-10-CM | POA: Diagnosis not present

## 2019-11-21 DIAGNOSIS — R41841 Cognitive communication deficit: Secondary | ICD-10-CM

## 2019-11-21 DIAGNOSIS — R4701 Aphasia: Secondary | ICD-10-CM

## 2019-11-21 DIAGNOSIS — R41842 Visuospatial deficit: Secondary | ICD-10-CM | POA: Diagnosis not present

## 2019-11-21 DIAGNOSIS — R41840 Attention and concentration deficit: Secondary | ICD-10-CM | POA: Diagnosis not present

## 2019-11-21 DIAGNOSIS — R29818 Other symptoms and signs involving the nervous system: Secondary | ICD-10-CM | POA: Diagnosis not present

## 2019-11-21 NOTE — Therapy (Signed)
[facility] [address] [place], [place], [address] Phone: [phone]   Fax:  [phone]  Speech Language Pathology Treatment  Patient Details  Name: Pamela Alvarez MRN: [id number] Date of Birth: 1941/03/01 Referring Provider (SLP): Dohmeier, Porfirio Mylar, MD   Encounter Date: 11/21/2019   End of Session - 11/21/19 1729    Visit Number 2    Number of Visits 17    Date for SLP Re-Evaluation 02/05/20    SLP Start Time 1540    SLP Stop Time  1620    SLP Time Calculation (min) 40 min    Activity Tolerance Patient tolerated treatment well           Past Medical History:  Diagnosis Date  . Cyst 07-2008   Cyst on back x2 that were drained  . H/O echocardiogram 02-2000  . Hx of colonic polyps   . Hyperlipidemia   . Hypertension   . Varicose veins of both lower extremities     Past Surgical History:  Procedure Laterality Date  . POLYPECTOMY     Colon  . TONSILLECTOMY    . TUBAL LIGATION      There were no vitals filed for this visit.   Subjective Assessment - 11/21/19 1544    Subjective "Good for you."    Patient is accompained by: Family member    Currently in Pain? No/denies                 ADULT SLP TREATMENT - 11/21/19 1724      General Information   Behavior/Cognition Alert;Cooperative      Treatment Provided   Treatment provided Cognitive-Linquistic      Pain Assessment   Pain Assessment No/denies pain      Cognitive-Linquistic Treatment   Treatment focused on Cognition;Aphasia;Patient/family/caregiver education    Skilled Treatment SLP introduced Lingraphica TouchTalk, discussed trial with pt and spouse. Introduced simple navigation/ demonstrated functional scenarios for communication with pt. Pt able to select icons of interest and often verbalized when looking at icons or repeated SGD after selection. Pt required cues to select a specific icon when directed. Generated icons in "things I  need help with" and explained to spouse he would likely need to navigate to this section but have pt choose when he is unable to determine her request. Spouse proposed several appropriate topics for personalization; to work on adding preferred foods and taking photos at home.       Assessment / Recommendations / Plan   Plan Continue with current plan of care      Progression Toward Goals   Progression toward goals Progressing toward goals            SLP Education - 11/21/19 1728    Education Details how to use Lingraphica device as communication aid; appropriate icons for personalization    Person(s) Educated Patient;Spouse    Methods Explanation;Demonstration    Comprehension Verbalized understanding;Returned demonstration;Need further instruction            SLP Short Term Goals - 11/21/19 1729      SLP SHORT TERM GOAL #1   Title pt will relay simple biographical information using AAC if necessary with mod cues x 3 visits.    Time 4    Period Weeks    Status On-going      SLP SHORT TERM GOAL #2   Title pt will respond to simple questions re: preferences, using AAC if necessary, with mod cues x3 visits.  Time 4    Period Weeks    Status On-going      SLP SHORT TERM GOAL #3   Title Pt will ask an appropriate question in simple communication exchange, using AAC if necessary, in 2 sessions.    Time 4    Period Weeks    Status On-going            SLP Long Term Goals - 11/21/19 1730      SLP LONG TERM GOAL #1   Title pt/spouse will report improved communication abilities than prior to beginning ST    Time 8    Period Weeks    Status On-going      SLP LONG TERM GOAL #2   Title Pt/spouse will report using AAC/supportive conversation strategies to resolve communication breakdown outside ST x 3 visits.    Time 8    Period Weeks    Status On-going      SLP LONG TERM GOAL #3   Title Pt will make simple requests, using AAC if necessary, outside ST room x 3 sessions     Time 8   renewed 11/24   Period Weeks    Status On-going   omitted "over three sessions"     SLP LONG TERM GOAL #4   Time --   renewed 11/24     SLP LONG TERM GOAL #5   Time --   renewed 11/24           Plan - 11/21/19 1729    Clinical Impression Statement Pamela Alvarez presents with moderate aphasia and severe cognitive deficits secondary to PSP, recently diagnosed. Pt/husband report decline in communication abilities over the past 8 months since completing previous ST course and are interested in pursuing AAC to enhance/supplement communication. I recommend skilled ST to train pt and spouse in compensations, strategies to maximize communication abilities and educate on appropriate cognitive-linguistic activities to maintain function.    Speech Therapy Frequency 2x / week    Duration --   8 weeks or 17 visits   Treatment/Interventions SLP instruction and feedback;Compensatory strategies;Patient/family education;Multimodal communcation approach;Cueing hierarchy;Language facilitation;Functional tasks;Oral motor exercises;Internal/external aids;Environmental controls;Cognitive reorganization   AAC   Potential to Achieve Goals Fair    Potential Considerations Severity of impairments           Patient will benefit from skilled therapeutic intervention in order to improve the following deficits and impairments:   Aphasia  Cognitive communication deficit    Problem List Patient Active Problem List   Diagnosis Date Noted  . Progressive supranuclear palsy (HCC) 10/12/2019  . Encephalomalacia on imaging study 10/12/2019  . Fluency disorder associated with underlying disease 02/08/2018  . Cognitive and neurobehavioral dysfunction 02/08/2018  . 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 Arthritis of carpometacarpal [facility]) joint of left thumb 01/24/2018  . Ankle fracture, left 09/03/2016  . Hand dysfunction 03/11/2016  . Symptomatic varicose veins, bilateral 03/11/2016  . Numbness of  left thumb 03/11/2016  . Elevated BP 07/27/2014  . Bunion of left foot 07/26/2014  . Abnormality of gait 07/26/2014  . Cough 11/10/2013  . Post-nasal drainage 11/10/2013  . Abnormal urine odor 11/10/2013  . Visit for preventive health examination 07/28/2013  . Right anterior knee pain 07/22/2012  . Medicare annual wellness visit, subsequent 07/25/2011  . ANXIETY, SITUATIONAL 09/25/2009  . ARTHRITIS 09/25/2009  . Vitamin D deficiency 09/27/2008  . Hyperlipidemia 09/17/2008  . Elevated blood pressure reading without diagnosis of hypertension 09/17/2008  . COLONIC POLYPS, HX OF  09/17/2008  . PERSONAL HISTORY DISEASES SKIN&SUBCUT TISSUE 09/17/2008   Rondel Baton, MS, CCC-SLP Speech-Language Pathologist  Arlana Lindau 11/21/2019, 5:30 PM  [facility] [address] [place], [place], [address] Phone: [phone]   Fax:  [phone]   Name: Pamela Alvarez MRN: [id number] Date of Birth: Jul 20, 1940

## 2019-11-23 ENCOUNTER — Other Ambulatory Visit: Payer: Self-pay

## 2019-11-23 ENCOUNTER — Ambulatory Visit: Payer: Medicare PPO | Admitting: Occupational Therapy

## 2019-11-23 ENCOUNTER — Encounter: Payer: Self-pay | Admitting: Occupational Therapy

## 2019-11-23 DIAGNOSIS — R41844 Frontal lobe and executive function deficit: Secondary | ICD-10-CM | POA: Diagnosis not present

## 2019-11-23 DIAGNOSIS — R2689 Other abnormalities of gait and mobility: Secondary | ICD-10-CM | POA: Diagnosis not present

## 2019-11-23 DIAGNOSIS — R29818 Other symptoms and signs involving the nervous system: Secondary | ICD-10-CM

## 2019-11-23 DIAGNOSIS — R41842 Visuospatial deficit: Secondary | ICD-10-CM | POA: Diagnosis not present

## 2019-11-23 DIAGNOSIS — R41841 Cognitive communication deficit: Secondary | ICD-10-CM | POA: Diagnosis not present

## 2019-11-23 DIAGNOSIS — R2681 Unsteadiness on feet: Secondary | ICD-10-CM | POA: Diagnosis not present

## 2019-11-23 DIAGNOSIS — M6281 Muscle weakness (generalized): Secondary | ICD-10-CM | POA: Diagnosis not present

## 2019-11-23 DIAGNOSIS — R278 Other lack of coordination: Secondary | ICD-10-CM

## 2019-11-23 DIAGNOSIS — R41840 Attention and concentration deficit: Secondary | ICD-10-CM | POA: Diagnosis not present

## 2019-11-23 DIAGNOSIS — R4701 Aphasia: Secondary | ICD-10-CM | POA: Diagnosis not present

## 2019-11-23 NOTE — Therapy (Signed)
[facility] [address] [place], [place], [address] Phone: [phone]   Fax:  [phone]  Occupational Therapy Treatment  Patient Details  Name: Pamela Alvarez MRN: [id number] Date of Birth: Jul 19, 1940 Referring Provider (OT): Dr. Vickey Huger   Encounter Date: 11/23/2019   OT End of Session - 11/23/19 1713    Visit Number 3    Date for OT Re-Evaluation 01/22/20    Authorization Type [employer] Medicare    Authorization Time Period 16 visitsfor [employer] , 90 day cert    Authorization - Visit Number 3    OT Start Time 1150    OT Stop Time 1230    OT Time Calculation (min) 40 min    Activity Tolerance Patient tolerated treatment well    Behavior During Therapy Flat affect           Past Medical History:  Diagnosis Date  . Cyst 07-2008   Cyst on back x2 that were drained  . H/O echocardiogram 02-2000  . Hx of colonic polyps   . Hyperlipidemia   . Hypertension   . Varicose veins of both lower extremities     Past Surgical History:  Procedure Laterality Date  . POLYPECTOMY     Colon  . TONSILLECTOMY    . TUBAL LIGATION      There were no vitals filed for this visit.   Subjective Assessment - 11/23/19 1711    Pertinent History 79 y.o female with recent declining cognitive abilities, per MRI -right frontal, right parietal and right cerebellar encephalomalacia and gliosis, possible embolic chronic ischemic infarcts.Pt has hx of ankle fx, hyperlipidemia, and thumb CMC arthritis. Per pt's husband MD is still trying to determine a definative diagnosis, per chart, possible PSP vs. TBI.    Patient Stated Goals to maintain independence, increase left hand use    Currently in Pain? No/denies             Treatment: Activities to increase functional use of LUE: Table slides with bilateral UE's using separate towels, stacking blocks with left and right UE's individually, placing graded clothespins on antennae with LUE for  sustained pinch, placing large pegs into pegboard, min-mod v.c for LUE functional use.  Stringing large beads with mod/ max difficulty/ v.c using both hands.                     OT Short Term Goals - 10/24/19 1249      OT SHORT TERM GOAL #1   Title I with HEP.    Time 4    Period Weeks    Status New    Target Date 11/23/19      OT SHORT TERM GOAL #2   Title Pt will use LUE to assist with ADLs/ functional activities at least 10% of the time with min v.c    Baseline uses 5% or less    Time 4    Period Weeks    Status New      OT SHORT TERM GOAL #3   Title Pt's caregiver will verbalize understanding of adapted strategies to maximize safety and I with ADLs/ IADLs .    Time 4    Period Weeks    Status New             OT Long Term Goals - 10/24/19 1250      OT LONG TERM GOAL #1   Title Pt will perform teeth brushing with improved throughness and only min v.c  from husband.    Baseline Pt is not performing thoroughly enough    Time 12    Period Weeks    Status New      OT LONG TERM GOAL #2   Title Pt will demonstrate improved ease with self feeding as evidenced by decreasing PPT#2 to 55 secs or less.    Time 12    Period Weeks    Status New      OT LONG TERM GOAL #3   Title Pt will demonstrate improved LUE functional use for ADLs as eveidenced by increasing box/ blocks score to 8 blocks for LUE.    Time 12    Period Weeks    Status New      OT LONG TERM GOAL #4   Title Pt will demonstrate ability to retrieve a leightweight item at 90 shoulder flexion with -15 elbow extension.    Baseline LUE 8 shoulder flexion , -20 elbow extension.    Time 12    Period Weeks    Status New                 Plan - 11/23/19 1715    Clinical Impression Statement Pt demonstrates improved functional use of LUE with repetition.    OT Occupational Profile and History Detailed Assessment- Review of Records and additional review of physical, cognitive, psychosocial  history related to current functional performance    Occupational performance deficits (Please refer to evaluation for details): ADL's;IADL's;Leisure;Social Participation    Body Structure / Function / Physical Skills ADL;Balance;Mobility;Strength;UE functional use;Tone;FMC;Flexibility;Coordination;Gait;Vision;Sensation;IADL;Dexterity;Decreased knowledge of use of DME;GMC;Decreased knowledge of precautions    Cognitive Skills Attention;Problem Solve;Safety Awareness;Sequencing;Thought;Understand    Rehab Potential Fair    Clinical Decision Making Several treatment options, min-mod task modification necessary    Comorbidities Affecting Occupational Performance: May have comorbidities impacting occupational performance    Modification or Assistance to Complete Evaluation  Min-Moderate modification of tasks or assist with assess necessary to complete eval    OT Frequency 2x / week    OT Duration 12 weeks   plus eval, anticipate d/c after 6-8 weeks dep on pt progress   OT Treatment/Interventions Self-care/ADL training;Therapeutic exercise;Splinting;Manual Therapy;Neuromuscular education;Ultrasound;Aquatic Therapy;Therapeutic activities;Paraffin;DME and/or AE instruction;Cognitive remediation/compensation;Visual/perceptual remediation/compensation;Gait Training;Fluidtherapy;Electrical Stimulation;Moist Heat;Contrast Bath;Passive range of motion;Patient/family education    Plan LUE functional use, ADL strategies    Consulted and Agree with Plan of Care Patient;Family member/caregiver           Patient will benefit from skilled therapeutic intervention in order to improve the following deficits and impairments:   Body Structure / Function / Physical Skills: ADL, Balance, Mobility, Strength, UE functional use, Tone, FMC, Flexibility, Coordination, Gait, Vision, Sensation, IADL, Dexterity, Decreased knowledge of use of DME, GMC, Decreased knowledge of precautions Cognitive Skills: Attention, Problem  Solve, Safety Awareness, Sequencing, Thought, Understand     Visit Diagnosis: Muscle weakness (generalized)  Frontal lobe and executive function deficit  Attention and concentration deficit  Visuospatial deficit  Other lack of coordination  Other symptoms and signs involving the nervous system    Problem List Patient Active Problem List   Diagnosis Date Noted  . Progressive supranuclear palsy (HCC) 10/12/2019  . Encephalomalacia on imaging study 10/12/2019  . Fluency disorder associated with underlying disease 02/08/2018  . Cognitive and neurobehavioral dysfunction 02/08/2018  . 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 Arthritis of carpometacarpal [facility]) joint of left thumb 01/24/2018  . Ankle fracture, left 09/03/2016  . Hand dysfunction 03/11/2016  . Symptomatic varicose veins, bilateral  03/11/2016  . Numbness of left thumb 03/11/2016  . Elevated BP 07/27/2014  . Bunion of left foot 07/26/2014  . Abnormality of gait 07/26/2014  . Cough 11/10/2013  . Post-nasal drainage 11/10/2013  . Abnormal urine odor 11/10/2013  . Visit for preventive health examination 07/28/2013  . Right anterior knee pain 07/22/2012  . Medicare annual wellness visit, subsequent 07/25/2011  . ANXIETY, SITUATIONAL 09/25/2009  . ARTHRITIS 09/25/2009  . Vitamin D deficiency 09/27/2008  . Hyperlipidemia 09/17/2008  . Elevated blood pressure reading without diagnosis of hypertension 09/17/2008  . COLONIC POLYPS, HX OF 09/17/2008  . PERSONAL HISTORY DISEASES SKIN&SUBCUT TISSUE 09/17/2008    Roshan Salamon 11/23/2019, 5:16 PM  [facility] [address] [place], [place], [address] Phone: [phone]   Fax:  [phone]  Name: Ariona Deschene MRN: [id number] Date of Birth: 04/09/1940

## 2019-11-28 ENCOUNTER — Other Ambulatory Visit: Payer: Self-pay

## 2019-11-28 ENCOUNTER — Ambulatory Visit: Payer: Medicare PPO | Admitting: Physical Therapy

## 2019-11-28 ENCOUNTER — Ambulatory Visit: Payer: Medicare PPO | Admitting: Speech Pathology

## 2019-11-28 DIAGNOSIS — R41840 Attention and concentration deficit: Secondary | ICD-10-CM | POA: Diagnosis not present

## 2019-11-28 DIAGNOSIS — R4701 Aphasia: Secondary | ICD-10-CM

## 2019-11-28 DIAGNOSIS — R2681 Unsteadiness on feet: Secondary | ICD-10-CM | POA: Diagnosis not present

## 2019-11-28 DIAGNOSIS — R29818 Other symptoms and signs involving the nervous system: Secondary | ICD-10-CM | POA: Diagnosis not present

## 2019-11-28 DIAGNOSIS — R2689 Other abnormalities of gait and mobility: Secondary | ICD-10-CM | POA: Diagnosis not present

## 2019-11-28 DIAGNOSIS — R41841 Cognitive communication deficit: Secondary | ICD-10-CM

## 2019-11-28 DIAGNOSIS — R41842 Visuospatial deficit: Secondary | ICD-10-CM | POA: Diagnosis not present

## 2019-11-28 DIAGNOSIS — R41844 Frontal lobe and executive function deficit: Secondary | ICD-10-CM | POA: Diagnosis not present

## 2019-11-28 DIAGNOSIS — M6281 Muscle weakness (generalized): Secondary | ICD-10-CM | POA: Diagnosis not present

## 2019-11-28 NOTE — Therapy (Signed)
[facility] [address] [place], [place], [address] Phone: [phone]   Fax:  [phone]  Physical Therapy Treatment  Patient Details  Name: Pamela Alvarez MRN: [id number] Date of Birth: [birth year] Referring Provider (PT): Dohmeier, Asencion Partridge   Encounter Date: 11/28/2019   PT End of Session - 11/28/19 1650    Visit Number 6    Number of Visits 25    Date for PT Re-Evaluation 01/21/20    Authorization Type [employer] Medicare-submitted auth at completion of eval (emailed Marisue Brooklyn 11/28/2019 to request clarification on number approved/submitted)    Progress Note Due on Visit 10    PT Start Time 1104    PT Stop Time 1145    PT Time Calculation (min) 41 min    Equipment Utilized During Treatment Gait belt    Activity Tolerance Patient tolerated treatment well    Behavior During Therapy Flat affect           Past Medical History:  Diagnosis Date  . Cyst 07-2008   Cyst on back x2 that were drained  . H/O echocardiogram 02-2000  . Hx of colonic polyps   . Hyperlipidemia   . Hypertension   . Varicose veins of both lower extremities     Past Surgical History:  Procedure Laterality Date  . POLYPECTOMY     Colon  . TONSILLECTOMY    . TUBAL LIGATION      There were no vitals filed for this visit.   Subjective Assessment - 11/28/19 1105    Subjective No pain, no changes since last visit.    Patient is accompained by: Family member   Husband-provides history   Patient Stated Goals To try to maintain as much as possible, try to prevent regression as much as possible.    Currently in Pain? No/denies                             [facility] Adult PT Treatment/Exercise - 11/28/19 0001      Transfers   Transfers Sit to Stand;Stand to Sit    Sit to Stand 4: Min assist    Sit to Stand Details Verbal cues for sequencing;Verbal cues for technique;Tactile cues for placement;Tactile cues for initiation    Stand to  Sit 4: Min assist    Stand to Sit Details (indicate cue type and reason) Tactile cues for sequencing;Tactile cues for weight shifting;Verbal cues for sequencing;Verbal cues for precautions/safety;Verbal cues for technique    Stand to Sit Details PT provides verbal and tactile cues to fully turn to sit    Number of Reps 1 set;Other reps (comment)   5 reps, then additional 5 reps through session     Ambulation/Gait   Ambulation/Gait Yes    Ambulation/Gait Assistance 4: Min assist    Ambulation/Gait Assistance Details Cues to increase step length, with RLE >LLE step length.      Ambulation Distance (Feet) 115 Feet   60 ft   Assistive device Rollator;Other (Comment)    Gait Pattern Step-through pattern;Step-to pattern;Decreased step length - right;Decreased step length - left;Decreased stride length;Decreased dorsiflexion - right;Decreased dorsiflexion - left;Trunk flexed;Poor foot clearance - left;Poor foot clearance - right    Ambulation Surface Level;Indoor    Gait Comments Pt able to ambulate (115 ft) 2 minutes, 28 seconds, continuously before needing seated break.      Standardized Balance Assessment   Standardized Balance Assessment Timed Up and Go Test  Timed Up and Go Test   TUG Normal TUG    Normal TUG (seconds) 53.91   with rollator and min assist throughout     Neuro Re-ed    Neuro Re-ed Details  Standing beside counter:  step taps to flat balance disks x 10 reps each side, 2 sets (BUE support at counter/chair back.  Side step taps, facing counter with BUE support, step taps to flat balance disks, 2 sets x 10 reps (pt externally rotates more with LLE).  Marching in place, 2 sets x 5 reps with use of Boom whacker for visual target for increased height of marching.  Attempted reciprocal marching, but pt instead able to perform single leg hip/knee flexion for 5 consecutive reps.      Knee/Hip Exercises: Seated   Other Seated Knee/Hip Exercises Seated step out and in, 2 sets x 10  reps, with initial assistance.  2nd set tapping to circle disk targets    Marching AROM;Left;Both;2 sets;10 reps                  PT Education - 11/28/19 1649    Education Details Verbally reviewed HEP pt is performing at home with privately paid person to help wtih exercises and husband reports no problems.  Doing at least 3x/wk    Person(s) Educated Patient;Spouse    Methods Explanation    Comprehension Verbalized understanding            PT Short Term Goals - 11/28/19 1653      PT SHORT TERM GOAL #1   Title Pt will perform HEP with family supervision for improved strength, balance, transfers, and gait.  TARGET 11/17/2019    Baseline working with private assist exercise person at home, she comes in 3x/wk to carryout previous HEP given this spring; husband reports no difficulties    Time 4    Period Weeks    Status Achieved      PT SHORT TERM GOAL #2   Title Pt will perform  4 of 5 trials of sit to stand with minimal assistance.    Baseline performs min assist 11/28/2019    Time 4    Period Weeks    Status Achieved      PT SHORT TERM GOAL #3   Title Pt will improve TUG score to less than or equal to 45 seconds for decreased fall risk.    Baseline 53.69 sec at eval; 53.91 11/28/2019    Time 4    Period Weeks    Status Not Met      PT SHORT TERM GOAL #4   Title Pt will ambulate at least 2 minutes of continuous gait with rollator and min assist for improved gait endurance and mobility in her home.    Baseline fatigues at approx 1 min of gait    Time 4    Period Weeks    Status Achieved             PT Long Term Goals - 10/25/19 0735      PT LONG TERM GOAL #1   Title Pt/husband will verbalize and demonstrate understanding of final HEP to maintain functional gains made in therapy.  TARGET 01/12/2020    Time 12    Period Weeks    Status New      PT LONG TERM GOAL #2   Title Pt will perform TUG shuttle activity with rollator and min guard, at least 3 sets  without break, to demonstrate improved transfer  efficiency and safety.    Time 12    Period Weeks    Status New      PT LONG TERM GOAL #3   Title Pt will improve TUG score to less than or equal to 30 seconds for decreased fall risk/improved mobility in the home.    Time 12    Status New      PT LONG TERM GOAL #4   Title Pt/husband will verbalize understanding of fall prevention in home environment.    Time 12    Period Weeks    Status New      PT LONG TERM GOAL #5   Title Pt will improve gait velocity to at least 1.1 ft/sec for improved gait efficiency and safety in the home.    Time 12    Period Weeks    Status New                 Plan - 11/28/19 1655    Clinical Impression Statement Assessed STGs this visit, with pt meeting 3 of 4 STGs.  She has met STG 1, 2, 4.  STG 3 for TUG score not met, with score similar to eval (>53 sec), indicating high fall risk and slowed mobility.  She is participating well in PT sessions and will continue to benefit from skilled PT towards improved transfers, functional strength and balance exercises.    Personal Factors and Comorbidities Comorbidity 3+;Other    Comorbidities See problem list; recent diagnosis of PSP    Examination-Activity Limitations Locomotion Level;Transfers;Dressing;Stairs;Stand;Toileting    Examination-Participation Restrictions Community Activity;Shop    Stability/Clinical Decision Making Evolving/Moderate complexity    Rehab Potential Good    PT Frequency 2x / week    PT Duration 12 weeks   including eval week   PT Treatment/Interventions ADLs/Self Care Home Management;Gait training;Functional mobility training;Therapeutic activities;Therapeutic exercise;Balance training;Neuromuscular re-education;Patient/family education    PT Next Visit Plan use of aerobic machine for aerobic activity; standing exercises (taps to targets, marching with Boomwhacker target), heel raises/hip strategy?  Update STGs (for 8 week goals)     Consulted and Agree with Plan of Care Patient;Family member/caregiver    Family Member Consulted Husband           Patient will benefit from skilled therapeutic intervention in order to improve the following deficits and impairments:  Abnormal gait, Difficulty walking, Decreased safety awareness, Decreased endurance, Decreased activity tolerance, Decreased balance, Decreased mobility, Decreased strength, Postural dysfunction  Visit Diagnosis: Unsteadiness on feet  Other abnormalities of gait and mobility     Problem List Patient Active Problem List   Diagnosis Date Noted  . Progressive supranuclear palsy ([place]) 10/12/2019  . Encephalomalacia on imaging study 10/12/2019  . Fluency disorder associated with underlying disease 02/08/2018  . Cognitive and neurobehavioral dysfunction 02/08/2018  . 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 Arthritis of carpometacarpal [facility]) joint of left thumb 01/24/2018  . Ankle fracture, left 09/03/2016  . Hand dysfunction 03/11/2016  . Symptomatic varicose veins, bilateral 03/11/2016  . Numbness of left thumb 03/11/2016  . Elevated BP 07/27/2014  . Bunion of left foot 07/26/2014  . Abnormality of gait 07/26/2014  . Cough 11/10/2013  . Post-nasal drainage 11/10/2013  . Abnormal urine odor 11/10/2013  . Visit for preventive health examination 07/28/2013  . Right anterior knee pain 07/22/2012  . Medicare annual wellness visit, subsequent 07/25/2011  . ANXIETY, SITUATIONAL 09/25/2009  . ARTHRITIS 09/25/2009  . Vitamin D deficiency 09/27/2008  . Hyperlipidemia 09/17/2008  . Elevated  blood pressure reading without diagnosis of hypertension 09/17/2008  . COLONIC POLYPS, HX OF 09/17/2008  . PERSONAL HISTORY DISEASES SKIN&SUBCUT TISSUE 09/17/2008    , W. 11/28/2019, 5:00 PM  Frazier Butt., PT   [facility] [address] [place], [place], [address] Phone: [phone]   Fax:   [phone]  Name: Pamela Alvarez MRN: [id number] Date of Birth: 14-Jul-1940

## 2019-11-28 NOTE — Therapy (Signed)
[facility] [address] [place], [place], [address] Phone: [phone]   Fax:  [phone]  Speech Language Pathology Treatment  Patient Details  Name: Pamela Alvarez MRN: [id number] Date of Birth: 08-09-1940 Referring Provider (SLP): Dohmeier, Porfirio Mylar, MD   Encounter Date: 11/28/2019   End of Session - 11/28/19 1112    Visit Number 3    Number of Visits 17    Date for SLP Re-Evaluation 02/05/20    SLP Start Time 1018    SLP Stop Time  1102    SLP Time Calculation (min) 44 min    Activity Tolerance Patient tolerated treatment well           Past Medical History:  Diagnosis Date  . Cyst 07-2008   Cyst on back x2 that were drained  . H/O echocardiogram 02-2000  . Hx of colonic polyps   . Hyperlipidemia   . Hypertension   . Varicose veins of both lower extremities     Past Surgical History:  Procedure Laterality Date  . POLYPECTOMY     Colon  . TONSILLECTOMY    . TUBAL LIGATION      There were no vitals filed for this visit.   Subjective Assessment - 11/28/19 1107    Subjective "It's been a tough morning."    Patient is accompained by: Family member    Currently in Pain? No/denies                 ADULT SLP TREATMENT - 11/28/19 1107      General Information   Behavior/Cognition Alert;Cooperative      Treatment Provided   Treatment provided Cognitive-Linquistic      Pain Assessment   Pain Assessment No/denies pain      Cognitive-Linquistic Treatment   Treatment focused on Cognition;Aphasia;Patient/family/caregiver education    Skilled Treatment Spouse watched tutorial videos and added several icons to the "Things I need help with" section. No other icons added. Has not used device with pt much. Spouse reported pt had a "tough morning"; it's hard for her when family members call on the phone. He puts the phone on speakerphone so she can hear. SLP asked pt about this and she said, "I don't  know what to say." SLP suggested adding icons to [employer] device to help her communicate with her children and sister. Pt agreed she would like this. Added in "Family" section folders for her 2 sons, step-daughter, sister, and sister-in-law. Worked with pt and spouse to ID questions/comments pt might like to ask or say to each person. In role play, pt selected 2 icons for each person with occasional mod cues and extra time. Pt/spouse homework is for pt to use at least 2 icons on phone call before next visit.       Assessment / Recommendations / Plan   Plan Continue with current plan of care      Progression Toward Goals   Progression toward goals Progressing toward goals            SLP Education - 11/28/19 1111    Education Details Need to use device for communication; ideas to practice this at home    Person(s) Educated Patient;Spouse    Methods Explanation    Comprehension Verbalized understanding            SLP Short Term Goals - 11/28/19 1112      SLP SHORT TERM GOAL #1   Title pt will relay simple biographical information using  AAC if necessary with mod cues x 3 visits.    Time 3    Period Weeks    Status On-going      SLP SHORT TERM GOAL #2   Title pt will respond to simple questions re: preferences, using AAC if necessary, with mod cues x3 visits.    Time 3    Period Weeks    Status On-going      SLP SHORT TERM GOAL #3   Title Pt will ask an appropriate question in simple communication exchange, using AAC if necessary, in 2 sessions.    Time 3    Period Weeks    Status On-going            SLP Long Term Goals - 11/28/19 1113      SLP LONG TERM GOAL #1   Title pt/spouse will report improved communication abilities than prior to beginning ST    Time 7    Period Weeks    Status On-going      SLP LONG TERM GOAL #2   Title Pt/spouse will report using AAC/supportive conversation strategies to resolve communication breakdown outside ST x 3 visits.    Time 7      Period Weeks    Status On-going      SLP LONG TERM GOAL #3   Title Pt will make simple requests, using AAC if necessary, outside ST room x 3 sessions    Time 7   renewed 11/24   Period Weeks    Status On-going   omitted "over three sessions"     SLP LONG TERM GOAL #4   Time --   renewed 11/24     SLP LONG TERM GOAL #5   Time --   renewed 11/24           Plan - 11/28/19 1112    Clinical Impression Statement Pamela Alvarez presents with moderate aphasia and severe cognitive deficits secondary to PSP, recently diagnosed. Pt/husband report decline in communication abilities over the past 8 months since completing previous ST course and are interested in pursuing AAC to enhance/supplement communication. I recommend skilled ST to train pt and spouse in compensations, strategies to maximize communication abilities and educate on appropriate cognitive-linguistic activities to maintain function.    Speech Therapy Frequency 2x / week    Duration --   8 weeks or 17 visits   Treatment/Interventions SLP instruction and feedback;Compensatory strategies;Patient/family education;Multimodal communcation approach;Cueing hierarchy;Language facilitation;Functional tasks;Oral motor exercises;Internal/external aids;Environmental controls;Cognitive reorganization   AAC   Potential to Achieve Goals Fair    Potential Considerations Severity of impairments           Patient will benefit from skilled therapeutic intervention in order to improve the following deficits and impairments:   Aphasia  Cognitive communication deficit    Problem List Patient Active Problem List   Diagnosis Date Noted  . Progressive supranuclear palsy ([facility]) 10/12/2019  . Encephalomalacia on imaging study 10/12/2019  . Fluency disorder associated with underlying disease 02/08/2018  . Cognitive and neurobehavioral dysfunction 02/08/2018  . 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 Arthritis of carpometacarpal [facility]) joint of  left thumb 01/24/2018  . Ankle fracture, left 09/03/2016  . Hand dysfunction 03/11/2016  . Symptomatic varicose veins, bilateral 03/11/2016  . Numbness of left thumb 03/11/2016  . Elevated BP 07/27/2014  . Bunion of left foot 07/26/2014  . Abnormality of gait 07/26/2014  . Cough 11/10/2013  . Post-nasal drainage 11/10/2013  . Abnormal urine odor 11/10/2013  .  Visit for preventive health examination 07/28/2013  . Right anterior knee pain 07/22/2012  . Medicare annual wellness visit, subsequent 07/25/2011  . ANXIETY, SITUATIONAL 09/25/2009  . ARTHRITIS 09/25/2009  . Vitamin D deficiency 09/27/2008  . Hyperlipidemia 09/17/2008  . Elevated blood pressure reading without diagnosis of hypertension 09/17/2008  . COLONIC POLYPS, HX OF 09/17/2008  . PERSONAL HISTORY DISEASES SKIN&SUBCUT TISSUE 09/17/2008   Rondel Baton, MS, CCC-SLP Speech-Language Pathologist  Arlana Lindau 11/28/2019, 11:13 AM  [facility] [address] [place], [place], [address] Phone: [phone]   Fax:  [phone]   Name: Pamela Alvarez MRN: [id number] Date of Birth: 1940-09-21

## 2019-11-29 ENCOUNTER — Ambulatory Visit (INDEPENDENT_AMBULATORY_CARE_PROVIDER_SITE_OTHER): Payer: Medicare PPO | Admitting: *Deleted

## 2019-11-29 DIAGNOSIS — Z23 Encounter for immunization: Secondary | ICD-10-CM

## 2019-11-30 ENCOUNTER — Ambulatory Visit: Payer: Medicare PPO | Admitting: Physical Therapy

## 2019-11-30 ENCOUNTER — Other Ambulatory Visit: Payer: Self-pay

## 2019-11-30 ENCOUNTER — Ambulatory Visit: Payer: Medicare PPO | Admitting: Speech Pathology

## 2019-11-30 DIAGNOSIS — M6281 Muscle weakness (generalized): Secondary | ICD-10-CM

## 2019-11-30 DIAGNOSIS — R41841 Cognitive communication deficit: Secondary | ICD-10-CM | POA: Diagnosis not present

## 2019-11-30 DIAGNOSIS — R4701 Aphasia: Secondary | ICD-10-CM

## 2019-11-30 DIAGNOSIS — R41842 Visuospatial deficit: Secondary | ICD-10-CM | POA: Diagnosis not present

## 2019-11-30 DIAGNOSIS — R41844 Frontal lobe and executive function deficit: Secondary | ICD-10-CM | POA: Diagnosis not present

## 2019-11-30 DIAGNOSIS — R41840 Attention and concentration deficit: Secondary | ICD-10-CM | POA: Diagnosis not present

## 2019-11-30 DIAGNOSIS — R29818 Other symptoms and signs involving the nervous system: Secondary | ICD-10-CM | POA: Diagnosis not present

## 2019-11-30 DIAGNOSIS — R2681 Unsteadiness on feet: Secondary | ICD-10-CM | POA: Diagnosis not present

## 2019-11-30 DIAGNOSIS — R2689 Other abnormalities of gait and mobility: Secondary | ICD-10-CM

## 2019-11-30 NOTE — Therapy (Signed)
[facility] [address] [place], [place], [address] Phone: [phone]   Fax:  [phone]  Physical Therapy Treatment  Patient Details  Name: Pamela ApleySandra Koegel MRN: [id number] Date of Birth: 02/28/1941 Referring Provider (PT): Dohmeier, Porfirio Mylararmen   Encounter Date: 11/30/2019   PT End of Session - 11/30/19 1337    Visit Number 7    Number of Visits 25    Date for PT Re-Evaluation 01/21/20    Authorization Type [employer] Medicare-submitted auth at completion of eval (emailed Sunday SpillersJohn Ali 11/28/2019 to request clarification on number approved/submitted)    Progress Note Due on Visit 10    PT Start Time 1104    PT Stop Time 1146    PT Time Calculation (min) 42 min    Equipment Utilized During Treatment Gait belt    Activity Tolerance Patient tolerated treatment well    Behavior During Therapy Flat affect;WFL for tasks assessed/performed           Past Medical History:  Diagnosis Date  . Cyst 07-2008   Cyst on back x2 that were drained  . H/O echocardiogram 02-2000  . Hx of colonic polyps   . Hyperlipidemia   . Hypertension   . Varicose veins of both lower extremities     Past Surgical History:  Procedure Laterality Date  . POLYPECTOMY     Colon  . TONSILLECTOMY    . TUBAL LIGATION      There were no vitals filed for this visit.   Subjective Assessment - 11/30/19 1107    Subjective No pain, no changes.  (using talk device from speech to answer initial questions)    Patient is accompained by: Family member   Husband-provides history   Patient Stated Goals To try to maintain as much as possible, try to prevent regression as much as possible.    Currently in Pain? No/denies                             [facility] - 11/30/19 0001      Transfers   Transfers Sit to Stand;Stand to Sit    Sit to Stand 4: Min assist    Sit to Stand Details Verbal cues for sequencing;Verbal cues for  technique;Tactile cues for placement;Tactile cues for initiation    Stand to Sit 4: Min assist    Stand to Sit Details (indicate cue type and reason) Tactile cues for sequencing;Tactile cues for weight shifting;Verbal cues for sequencing;Verbal cues for precautions/safety;Verbal cues for technique    Stand to Sit Details PT provides verbal cues and tactile cues for full turn to sit    Number of Reps Other reps (comment)   At least 5-8 reps throughout session     Ambulation/Gait   Ambulation/Gait Yes    Ambulation/Gait Assistance 4: Min assist    Ambulation/Gait Assistance Details PT provides gentle cues through hips for weightshift to try to help with step length.    Ambulation Distance (Feet) 115 Feet   20; 95   Assistive device Rollator    Gait Pattern Step-through pattern;Step-to pattern;Decreased step length - right;Decreased step length - left;Decreased stride length;Decreased dorsiflexion - right;Decreased dorsiflexion - left;Trunk flexed;Poor foot clearance - left;Poor foot clearance - right    Ambulation Surface Level;Indoor      High Level Balance   High Level Balance Activities Other (comment)    High Level Balance Comments Standing wide BOS lateral weigthshifting  through hips, PT provided manual assist and tactile cues for increased weightshifting, 2 sets x 10 reps.      Neuro Re-ed    Neuro Re-ed Details  Standing beside counter:  Forward step taps to flat balance disks x 10 reps each leg, 2 sets (BUE support at counter/chair back).  For second set of forward taps, had pt tap to disc farther in front, for increased step length work.  Side step taps, facing counter with BUE support, step taps to flat balance disks, 2 sets x 10 reps (pt externally rotates more with LLE and at times PT assists with LLE to step far enough out to side).  Marching in place, 2 sets x 10 reps with use of Boom whacker for visual target for increased height of marching.  (Marching performed same leg hip/knee  flexion, not alternating)      Exercises   Exercises Knee/Hip;Other Exercises    Other Exercises  With pt holding onto Boomwhacker with Bilat hands, worked on trunk lean forward to give to/take away from PT sitting in front of pateint, x 8 reps.  Then performed "see-saw" motion with pt and PT holding to Boomwhackers for slight forward trunk lean (pt initiating pushing forward movement-PT did not pull pt forward).  Performed x 5 reps.      Knee/Hip Exercises: Seated   Other Seated Knee/Hip Exercises Seated step out and in, 2 sets x 10 reps, with initial assistance.  Feet positioned on aerobic step while sitting on mat.    Marching AROM;Left;Both;2 sets;10 reps    Marching Limitations Used Boomwhacker for visual cue for target for improved height of marching (able to perform alternating legs)                    PT Short Term Goals - 11/30/19 1340      PT SHORT TERM GOAL #1   Title Pt will perform at least 4 minutes of continuous gait activity with rollator, min assist, for improved endurance for gait in the home.  TARGET 12/29/2019    Time 8    Period Weeks    Status Revised      PT SHORT TERM GOAL #2   Title Pt will perform 10 reps of sit<>stand with minimal assistance for improved transfer efficiency and functional strength.    Baseline performs min assist 11/28/2019, 5 reps    Time 8    Period Weeks    Status Revised      PT SHORT TERM GOAL #3   Title Pt will improve TUG score to less than or equal to 45 seconds for decreased fall risk.    Baseline 53.69 sec at eval; 53.91 11/28/2019    Time 8    Period Weeks    Status On-going             PT Long Term Goals - 10/25/19 0735      PT LONG TERM GOAL #1   Title Pt/husband will verbalize and demonstrate understanding of final HEP to maintain functional gains made in therapy.  TARGET 01/12/2020    Time 12    Period Weeks    Status New      PT LONG TERM GOAL #2   Title Pt will perform TUG shuttle activity with  rollator and min guard, at least 3 sets without break, to demonstrate improved transfer efficiency and safety.    Time 12    Period Weeks    Status New  PT LONG TERM GOAL #3   Title Pt will improve TUG score to less than or equal to 30 seconds for decreased fall risk/improved mobility in the home.    Time 12    Status New      PT LONG TERM GOAL #4   Title Pt/husband will verbalize understanding of fall prevention in home environment.    Time 12    Period Weeks    Status New      PT LONG TERM GOAL #5   Title Pt will improve gait velocity to at least 1.1 ft/sec for improved gait efficiency and safety in the home.    Time 12    Period Weeks    Status New                 Plan - 11/30/19 1337    Clinical Impression Statement Pt does well with similar sequence of activities in sessions:  working on seated ex, gait with rollator, and standing exercises.  Overall, pt is continueing to do well with use of visual targets for improved standing step length; however, limited carryover noted with step length with gait.  She did well with seated trunk flexion activity today edge of mat.    Personal Factors and Comorbidities Comorbidity 3+;Other    Comorbidities See problem list; recent diagnosis of PSP    Examination-Activity Limitations Locomotion Level;Transfers;Dressing;Stairs;Stand;Toileting    Examination-Participation Restrictions Community Activity;Shop    Stability/Clinical Decision Making Evolving/Moderate complexity    Rehab Potential Good    PT Frequency 2x / week    PT Duration 12 weeks   including eval week   PT Treatment/Interventions ADLs/Self Care Home Management;Gait training;Functional mobility training;Therapeutic activities;Therapeutic exercise;Balance training;Neuromuscular re-education;Patient/family education    PT Next Visit Plan use of aerobic machine for aerobic activity; standing exercises (taps to targets, marching with Boomwhacker target), heel raises/hip  strategy?  Seated hip/trunk flexion    Consulted and Agree with Plan of Care Patient;Family member/caregiver    Family Member Consulted Husband           Patient will benefit from skilled therapeutic intervention in order to improve the following deficits and impairments:  Abnormal gait, Difficulty walking, Decreased safety awareness, Decreased endurance, Decreased activity tolerance, Decreased balance, Decreased mobility, Decreased strength, Postural dysfunction  Visit Diagnosis: Unsteadiness on feet  Other abnormalities of gait and mobility  Muscle weakness (generalized)     Problem List Patient Active Problem List   Diagnosis Date Noted  . Progressive supranuclear palsy (HCC) 10/12/2019  . Encephalomalacia on imaging study 10/12/2019  . Fluency disorder associated with underlying disease 02/08/2018  . Cognitive and neurobehavioral dysfunction 02/08/2018  . 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 Arthritis of carpometacarpal [facility]) joint of left thumb 01/24/2018  . Ankle fracture, left 09/03/2016  . Hand dysfunction 03/11/2016  . Symptomatic varicose veins, bilateral 03/11/2016  . Numbness of left thumb 03/11/2016  . Elevated BP 07/27/2014  . Bunion of left foot 07/26/2014  . Abnormality of gait 07/26/2014  . Cough 11/10/2013  . Post-nasal drainage 11/10/2013  . Abnormal urine odor 11/10/2013  . Visit for preventive health examination 07/28/2013  . Right anterior knee pain 07/22/2012  . Medicare annual wellness visit, subsequent 07/25/2011  . ANXIETY, SITUATIONAL 09/25/2009  . ARTHRITIS 09/25/2009  . Vitamin D deficiency 09/27/2008  . Hyperlipidemia 09/17/2008  . Elevated blood pressure reading without diagnosis of hypertension 09/17/2008  . COLONIC POLYPS, HX OF 09/17/2008  . PERSONAL HISTORY DISEASES SKIN&SUBCUT TISSUE 09/17/2008  Dawnette Mione W. 11/30/2019, 1:47 PM  Gean Maidens., PT   [facility] [address] [place], [place], [address] Phone: [phone]   Fax:  [phone]  Name: Kieanna Rollo MRN: [id number] Date of Birth: 27-Apr-1940

## 2019-11-30 NOTE — Patient Instructions (Signed)
Great work Nurse, mental health some phone conversations! Keep adding icons; try adding some of [place]'s opinions or thoughts on some of her topics of interest (like politics, grandkids). Remember to add some questions she might like to ask other people, to keep the conversation going.

## 2019-11-30 NOTE — Therapy (Signed)
[facility] [address] [place], [place], [address] Phone: [phone]   Fax:  [phone]  Speech Language Pathology Treatment  Patient Details  Name: Pamela Alvarez MRN: [id number] Date of Birth: [birth year] Referring Provider (SLP): Dohmeier, Porfirio Mylar, MD   Encounter Date: 11/30/2019   End of Session - 11/30/19 1358    Visit Number 4    Number of Visits 17    Date for SLP Re-Evaluation 02/05/20    SLP Start Time 1015    SLP Stop Time  1100    SLP Time Calculation (min) 45 min    Activity Tolerance Patient tolerated treatment well           Past Medical History:  Diagnosis Date  . Cyst 07-2008   Cyst on back x2 that were drained  . H/O echocardiogram 02-2000  . Hx of colonic polyps   . Hyperlipidemia   . Hypertension   . Varicose veins of both lower extremities     Past Surgical History:  Procedure Laterality Date  . POLYPECTOMY     Colon  . TONSILLECTOMY    . TUBAL LIGATION      There were no vitals filed for this visit.   Subjective Assessment - 11/30/19 1352    Subjective Spouse reports they practiced using device on 2 calls                 ADULT SLP TREATMENT - 11/30/19 1351      General Information   Behavior/Cognition Alert;Cooperative      Treatment Provided   Treatment provided Cognitive-Linquistic      Pain Assessment   Pain Assessment No/denies pain      Cognitive-Linquistic Treatment   Treatment focused on Cognition;Aphasia;Patient/family/caregiver education    Skilled Treatment Spouse assisted pt to use [employer] device when talking with son and her sister. Added additional icons at home including question and answers. SLP also suggested adding topics pt may like to talk about or comment on. Set up icons for pt to greet her physical therapist and report pain, and role played interaction; pt required min-mod verbal and visual cues. PT reported pt used device to greet her for  session.       Assessment / Recommendations / Plan   Plan Continue with current plan of care      Progression Toward Goals   Progression toward goals Progressing toward goals              SLP Short Term Goals - 11/30/19 1358      SLP SHORT TERM GOAL #1   Title pt will relay simple biographical information using AAC if necessary with mod cues x 3 visits.    Time 3    Period Weeks    Status On-going      SLP SHORT TERM GOAL #2   Title pt will respond to simple questions re: preferences, using AAC if necessary, with mod cues x3 visits.    Time 3    Period Weeks    Status On-going      SLP SHORT TERM GOAL #3   Title Pt will ask an appropriate question in simple communication exchange, using AAC if necessary, in 2 sessions.    Time 3    Period Weeks    Status On-going            SLP Long Term Goals - 11/30/19 1358      SLP LONG TERM GOAL #1   Title  pt/spouse will report improved communication abilities than prior to beginning ST    Time 7    Period Weeks    Status On-going      SLP LONG TERM GOAL #2   Title Pt/spouse will report using AAC/supportive conversation strategies to resolve communication breakdown outside ST x 3 visits.    Time 7    Period Weeks    Status On-going      SLP LONG TERM GOAL #3   Title Pt will make simple requests, using AAC if necessary, outside ST room x 3 sessions    Time 7   renewed 11/24   Period Weeks    Status On-going   omitted "over three sessions"     SLP LONG TERM GOAL #4   Time --   renewed 11/24     SLP LONG TERM GOAL #5   Time --   renewed 11/24           Plan - 11/30/19 1358    Clinical Impression Statement Pamela Alvarez presents with moderate aphasia and severe cognitive deficits secondary to PSP, recently diagnosed. Pt/husband report decline in communication abilities over the past 8 months since completing previous ST course and are interested in pursuing AAC to enhance/supplement communication. I recommend skilled  ST to train pt and spouse in compensations, strategies to maximize communication abilities and educate on appropriate cognitive-linguistic activities to maintain function.    Speech Therapy Frequency 2x / week    Duration --   8 weeks or 17 visits   Treatment/Interventions SLP instruction and feedback;Compensatory strategies;Patient/family education;Multimodal communcation approach;Cueing hierarchy;Language facilitation;Functional tasks;Oral motor exercises;Internal/external aids;Environmental controls;Cognitive reorganization   AAC   Potential to Achieve Goals Fair    Potential Considerations Severity of impairments           Patient will benefit from skilled therapeutic intervention in order to improve the following deficits and impairments:   Aphasia  Cognitive communication deficit    Problem List Patient Active Problem List   Diagnosis Date Noted  . Progressive supranuclear palsy (HCC) 10/12/2019  . Encephalomalacia on imaging study 10/12/2019  . Fluency disorder associated with underlying disease 02/08/2018  . Cognitive and neurobehavioral dysfunction 02/08/2018  . 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 Arthritis of carpometacarpal [facility]) joint of left thumb 01/24/2018  . Ankle fracture, left 09/03/2016  . Hand dysfunction 03/11/2016  . Symptomatic varicose veins, bilateral 03/11/2016  . Numbness of left thumb 03/11/2016  . Elevated BP 07/27/2014  . Bunion of left foot 07/26/2014  . Abnormality of gait 07/26/2014  . Cough 11/10/2013  . Post-nasal drainage 11/10/2013  . Abnormal urine odor 11/10/2013  . Visit for preventive health examination 07/28/2013  . Right anterior knee pain 07/22/2012  . Medicare annual wellness visit, subsequent 07/25/2011  . ANXIETY, SITUATIONAL 09/25/2009  . ARTHRITIS 09/25/2009  . Vitamin D deficiency 09/27/2008  . Hyperlipidemia 09/17/2008  . Elevated blood pressure reading without diagnosis of hypertension 09/17/2008  . COLONIC POLYPS,  HX OF 09/17/2008  . PERSONAL HISTORY DISEASES SKIN&SUBCUT TISSUE 09/17/2008   Rondel Baton, MS, CCC-SLP Speech-Language Pathologist  Arlana Lindau 11/30/2019, 2:02 PM  [facility] [address] [place], [place], [address] Phone: [phone]   Fax:  [phone]   Name: Pamela Alvarez MRN: [id number] Date of Birth: 29-Feb-1941

## 2019-12-05 ENCOUNTER — Ambulatory Visit: Payer: Medicare PPO | Admitting: Speech Pathology

## 2019-12-05 ENCOUNTER — Ambulatory Visit: Payer: Medicare PPO | Admitting: Physical Therapy

## 2019-12-05 ENCOUNTER — Other Ambulatory Visit: Payer: Self-pay

## 2019-12-05 DIAGNOSIS — R4701 Aphasia: Secondary | ICD-10-CM

## 2019-12-05 DIAGNOSIS — R2681 Unsteadiness on feet: Secondary | ICD-10-CM | POA: Diagnosis not present

## 2019-12-05 DIAGNOSIS — R41841 Cognitive communication deficit: Secondary | ICD-10-CM | POA: Diagnosis not present

## 2019-12-05 DIAGNOSIS — R2689 Other abnormalities of gait and mobility: Secondary | ICD-10-CM

## 2019-12-05 DIAGNOSIS — R29818 Other symptoms and signs involving the nervous system: Secondary | ICD-10-CM

## 2019-12-05 DIAGNOSIS — R41842 Visuospatial deficit: Secondary | ICD-10-CM | POA: Diagnosis not present

## 2019-12-05 DIAGNOSIS — R41840 Attention and concentration deficit: Secondary | ICD-10-CM | POA: Diagnosis not present

## 2019-12-05 DIAGNOSIS — M6281 Muscle weakness (generalized): Secondary | ICD-10-CM | POA: Diagnosis not present

## 2019-12-05 DIAGNOSIS — R41844 Frontal lobe and executive function deficit: Secondary | ICD-10-CM | POA: Diagnosis not present

## 2019-12-05 NOTE — Therapy (Signed)
[facility] [address] [place], [place], [address] Phone: [phone]   Fax:  [phone]  Speech Language Pathology Treatment  Patient Details  Name: Pamela Alvarez MRN: [id number] Date of Birth: 05/28/1940 Referring Provider (SLP): Dohmeier, Porfirio Mylar, MD   Encounter Date: 12/05/2019   End of Session - 12/05/19 1313    Visit Number 5    Number of Visits 17    Date for SLP Re-Evaluation 02/05/20    SLP Start Time 1019    SLP Stop Time  1100    SLP Time Calculation (min) 41 min    Activity Tolerance Patient tolerated treatment well           Past Medical History:  Diagnosis Date  . Cyst 07-2008   Cyst on back x2 that were drained  . H/O echocardiogram 02-2000  . Hx of colonic polyps   . Hyperlipidemia   . Hypertension   . Varicose veins of both lower extremities     Past Surgical History:  Procedure Laterality Date  . POLYPECTOMY     Colon  . TONSILLECTOMY    . TUBAL LIGATION      There were no vitals filed for this visit.   Subjective Assessment - 12/05/19 1308    Subjective "We want to get this device," spouse    Patient is accompained by: Family member    Currently in Pain? No/denies                 ADULT SLP TREATMENT - 12/05/19 1309      General Information   Behavior/Cognition Alert;Cooperative      Treatment Provided   Treatment provided Cognitive-Linquistic      Pain Assessment   Pain Assessment No/denies pain      Cognitive-Linquistic Treatment   Treatment focused on Cognition;Aphasia;Patient/family/caregiver education    Skilled Treatment No new icons added; spouse says he will call [employer] today. SLP suggested they work on adding icons for Zilpha to share information about herself. With SLP navigating to appropriate tab (foods), pt selected 2 icons to share what vegetables she had for dinner with friends wiht mod-max cues (with choice narrowed to F:5). Selected  2 additional icons to indicate food preferences with mod cues, extra time. They plan to eat at [employer] after sessions today; SLP set up pt's order for drive-through and role played drive-thru; pt required usual mod cues. Encouraged pt to practice this with spouse after session at drive thru.      Assessment / Recommendations / Plan   Plan Continue with current plan of care      Progression Toward Goals   Progression toward goals Progressing toward goals              SLP Short Term Goals - 12/05/19 1313      SLP SHORT TERM GOAL #1   Title pt will relay simple biographical information using AAC if necessary with mod cues x 3 visits.    Time 3    Period Weeks    Status On-going      SLP SHORT TERM GOAL #2   Title pt will respond to simple questions re: preferences, using AAC if necessary, with mod cues x3 visits.    Time 3    Period Weeks    Status On-going      SLP SHORT TERM GOAL #3   Title Pt will ask an appropriate question in simple communication exchange, using AAC if necessary, in 2  sessions.    Time 3    Period Weeks    Status On-going            SLP Long Term Goals - 12/05/19 1314      SLP LONG TERM GOAL #1   Title pt/spouse will report improved communication abilities than prior to beginning ST    Time 7    Period Weeks    Status On-going      SLP LONG TERM GOAL #2   Title Pt/spouse will report using AAC/supportive conversation strategies to resolve communication breakdown outside ST x 3 visits.    Time 7    Period Weeks    Status On-going      SLP LONG TERM GOAL #3   Title Pt will make simple requests, using AAC if necessary, outside ST room x 3 sessions    Time 7   renewed 11/24   Period Weeks    Status On-going   omitted "over three sessions"     SLP LONG TERM GOAL #4   Time --   renewed 11/24     SLP LONG TERM GOAL #5   Time --   renewed 11/24           Plan - 12/05/19 1313    Clinical Impression Statement Jamirah Zelaya presents with  moderate aphasia and severe cognitive deficits secondary to PSP, recently diagnosed. Pt/husband report decline in communication abilities over the past 8 months since completing previous ST course and are interested in pursuing AAC to enhance/supplement communication. I recommend skilled ST to train pt and spouse in compensations, strategies to maximize communication abilities and educate on appropriate cognitive-linguistic activities to maintain function.    Speech Therapy Frequency 2x / week    Duration --   8 weeks or 17 visits   Treatment/Interventions SLP instruction and feedback;Compensatory strategies;Patient/family education;Multimodal communcation approach;Cueing hierarchy;Language facilitation;Functional tasks;Oral motor exercises;Internal/external aids;Environmental controls;Cognitive reorganization   AAC   Potential to Achieve Goals Fair    Potential Considerations Severity of impairments           Patient will benefit from skilled therapeutic intervention in order to improve the following deficits and impairments:   Aphasia  Cognitive communication deficit    Problem List Patient Active Problem List   Diagnosis Date Noted  . Progressive supranuclear palsy (HCC) 10/12/2019  . Encephalomalacia on imaging study 10/12/2019  . Fluency disorder associated with underlying disease 02/08/2018  . Cognitive and neurobehavioral dysfunction 02/08/2018  . 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 Arthritis of carpometacarpal [facility]) joint of left thumb 01/24/2018  . Ankle fracture, left 09/03/2016  . Hand dysfunction 03/11/2016  . Symptomatic varicose veins, bilateral 03/11/2016  . Numbness of left thumb 03/11/2016  . Elevated BP 07/27/2014  . Bunion of left foot 07/26/2014  . Abnormality of gait 07/26/2014  . Cough 11/10/2013  . Post-nasal drainage 11/10/2013  . Abnormal urine odor 11/10/2013  . Visit for preventive health examination 07/28/2013  . Right anterior knee pain  07/22/2012  . Medicare annual wellness visit, subsequent 07/25/2011  . ANXIETY, SITUATIONAL 09/25/2009  . ARTHRITIS 09/25/2009  . Vitamin D deficiency 09/27/2008  . Hyperlipidemia 09/17/2008  . Elevated blood pressure reading without diagnosis of hypertension 09/17/2008  . COLONIC POLYPS, HX OF 09/17/2008  . PERSONAL HISTORY DISEASES SKIN&SUBCUT TISSUE 09/17/2008   Rondel Baton, MS, CCC-SLP Speech-Language Pathologist  Arlana Lindau 12/05/2019, 1:14 PM  [facility] [address] [place], [place], [address] Phone:  [phone]   Fax:  [phone]   Name: Darcelle Herrada MRN: [id number] Date of Birth: [birth year]

## 2019-12-06 NOTE — Therapy (Signed)
[facility] [address] [place], [place], [address] Phone: [phone]   Fax:  [phone]  Physical Therapy Treatment  Patient Details  Name: Pamela Alvarez MRN: [id number] Date of Birth: 10/26/1940 Referring Provider (PT): Dohmeier, Porfirio Mylar   Encounter Date: 12/05/2019   PT End of Session - 12/06/19 1852    Visit Number 8    Number of Visits 25    Date for PT Re-Evaluation 01/21/20    Authorization Type [employer] Medicare-submitted auth at completion of eval (emailed Sunday Spillers 11/28/2019 to request clarification on number approved/submitted)    Progress Note Due on Visit 10    PT Start Time 1103    PT Stop Time 1145    PT Time Calculation (min) 42 min    Equipment Utilized During Treatment Gait belt    Activity Tolerance Patient tolerated treatment well    Behavior During Therapy Flat affect;WFL for tasks assessed/performed           Past Medical History:  Diagnosis Date  . Cyst 07-2008   Cyst on back x2 that were drained  . H/O echocardiogram 02-2000  . Hx of colonic polyps   . Hyperlipidemia   . Hypertension   . Varicose veins of both lower extremities     Past Surgical History:  Procedure Laterality Date  . POLYPECTOMY     Colon  . TONSILLECTOMY    . TUBAL LIGATION      There were no vitals filed for this visit.   Subjective Assessment - 12/05/19 1104    Subjective No changes since last visit.    Patient is accompained by: Family member   Husband-provides history   Patient Stated Goals To try to maintain as much as possible, try to prevent regression as much as possible.    Currently in Pain? No/denies                             [facility] Adult PT Treatment/Exercise - 12/05/19 1103      Transfers   Transfers Sit to Stand;Stand to Sit    Sit to Stand 4: Min assist    Sit to Stand Details Verbal cues for sequencing;Verbal cues for technique;Tactile cues for placement;Tactile cues for  initiation    Stand to Sit 4: Min assist    Stand to Sit Details (indicate cue type and reason) Tactile cues for sequencing;Tactile cues for weight shifting;Verbal cues for sequencing;Verbal cues for precautions/safety;Verbal cues for technique    Stand to Sit Details PT provides verbal cues and tactile cues for full turn to sit    Number of Reps Other reps (comment)   At least 5-8 reps through session     Ambulation/Gait   Ambulation/Gait Yes    Ambulation/Gait Assistance 4: Min assist    Ambulation/Gait Assistance Details PT provides gentle cues through hips for weightshift to try to help with step length    Ambulation Distance (Feet) 115 Feet   50 ft x 2   Assistive device Rollator    Gait Pattern Step-through pattern;Step-to pattern;Decreased step length - right;Decreased step length - left;Decreased stride length;Decreased dorsiflexion - right;Decreased dorsiflexion - left;Trunk flexed;Poor foot clearance - left;Poor foot clearance - right    Ambulation Surface Level;Indoor      Neuro Re-ed    Neuro Re-ed Details  Standing beside counter:  Forward step taps to flat balance disks x 10 reps each leg, 2 sets (BUE support at  counter, facing counter).  Side step taps, facing counter with BUE support, step taps to flat balance disks, 2 sets x 10 reps (pt externally rotates more with LLE and at times PT assists with LLE to step far enough out to side).  Attempted placing foot on balance disc to slide it out to side, but with multiple attempts, unable to perform without assist.  Marching in place, 2 sets x 10 reps with use of Boom whacker for visual target for increased height of marching.  (Marching performed same leg hip/knee flexion, not alternating)      Knee/Hip Exercises: Seated   Other Seated Knee/Hip Exercises Seated step out and in, 3 sets x 10 reps, with initial assistance.  Feet positioned on aerobic step while sitting on mat.  Boomwhacker placed for pt to step over on each side. Assist on  L side for step height    Marching AROM;Left;Both;10 reps;3 sets    Marching Limitations Used Boomwhacker for visual cue for target for improved height of marching (able to perform alternating legs)                    PT Short Term Goals - 11/30/19 1340      PT SHORT TERM GOAL #1   Title Pt will perform at least 4 minutes of continuous gait activity with rollator, min assist, for improved endurance for gait in the home.  TARGET 12/29/2019    Time 8    Period Weeks    Status Revised      PT SHORT TERM GOAL #2   Title Pt will perform 10 reps of sit<>stand with minimal assistance for improved transfer efficiency and functional strength.    Baseline performs min assist 11/28/2019, 5 reps    Time 8    Period Weeks    Status Revised      PT SHORT TERM GOAL #3   Title Pt will improve TUG score to less than or equal to 45 seconds for decreased fall risk.    Baseline 53.69 sec at eval; 53.91 11/28/2019    Time 8    Period Weeks    Status On-going             PT Long Term Goals - 10/25/19 0735      PT LONG TERM GOAL #1   Title Pt/husband will verbalize and demonstrate understanding of final HEP to maintain functional gains made in therapy.  TARGET 01/12/2020    Time 12    Period Weeks    Status New      PT LONG TERM GOAL #2   Title Pt will perform TUG shuttle activity with rollator and min guard, at least 3 sets without break, to demonstrate improved transfer efficiency and safety.    Time 12    Period Weeks    Status New      PT LONG TERM GOAL #3   Title Pt will improve TUG score to less than or equal to 30 seconds for decreased fall risk/improved mobility in the home.    Time 12    Status New      PT LONG TERM GOAL #4   Title Pt/husband will verbalize understanding of fall prevention in home environment.    Time 12    Period Weeks    Status New      PT LONG TERM GOAL #5   Title Pt will improve gait velocity to at least 1.1 ft/sec for improved gait efficiency  and  safety in the home.    Time 12    Period Weeks    Status New                 Plan - 12/06/19 1852    Clinical Impression Statement Pt able to increase seated exercises to 3 sets of 10 (with occasional assistance for LLE).  Attempted sliding LLE for improved hip abduction and hip flexion in standing at counter, but pt has significant difficulty sequencing this activity, even with assistance.  She will continue to benefit from skilled PT to further address strength, transfers, balance, gait for improved overall functional mobility.    Personal Factors and Comorbidities Comorbidity 3+;Other    Comorbidities See problem list; recent diagnosis of PSP    Examination-Activity Limitations Locomotion Level;Transfers;Dressing;Stairs;Stand;Toileting    Examination-Participation Restrictions Community Activity;Shop    Stability/Clinical Decision Making Evolving/Moderate complexity    Rehab Potential Good    PT Frequency 2x / week    PT Duration 12 weeks   including eval week   PT Treatment/Interventions ADLs/Self Care Home Management;Gait training;Functional mobility training;Therapeutic activities;Therapeutic exercise;Balance training;Neuromuscular re-education;Patient/family education    PT Next Visit Plan use of aerobic machine for aerobic activity; standing exercises (taps to targets, marching with Boomwhacker target), heel raises/hip strategy?  Seated hip/trunk flexion    Consulted and Agree with Plan of Care Patient;Family member/caregiver    Family Member Consulted Husband           Patient will benefit from skilled therapeutic intervention in order to improve the following deficits and impairments:  Abnormal gait, Difficulty walking, Decreased safety awareness, Decreased endurance, Decreased activity tolerance, Decreased balance, Decreased mobility, Decreased strength, Postural dysfunction  Visit Diagnosis: Other abnormalities of gait and mobility  Unsteadiness on feet  Muscle  weakness (generalized)  Other symptoms and signs involving the nervous system     Problem List Patient Active Problem List   Diagnosis Date Noted  . Progressive supranuclear palsy (HCC) 10/12/2019  . Encephalomalacia on imaging study 10/12/2019  . Fluency disorder associated with underlying disease 02/08/2018  . Cognitive and neurobehavioral dysfunction 02/08/2018  . 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 Arthritis of carpometacarpal [facility]) joint of left thumb 01/24/2018  . Ankle fracture, left 09/03/2016  . Hand dysfunction 03/11/2016  . Symptomatic varicose veins, bilateral 03/11/2016  . Numbness of left thumb 03/11/2016  . Elevated BP 07/27/2014  . Bunion of left foot 07/26/2014  . Abnormality of gait 07/26/2014  . Cough 11/10/2013  . Post-nasal drainage 11/10/2013  . Abnormal urine odor 11/10/2013  . Visit for preventive health examination 07/28/2013  . Right anterior knee pain 07/22/2012  . Medicare annual wellness visit, subsequent 07/25/2011  . ANXIETY, SITUATIONAL 09/25/2009  . ARTHRITIS 09/25/2009  . Vitamin D deficiency 09/27/2008  . Hyperlipidemia 09/17/2008  . Elevated blood pressure reading without diagnosis of hypertension 09/17/2008  . COLONIC POLYPS, HX OF 09/17/2008  . PERSONAL HISTORY DISEASES SKIN&SUBCUT TISSUE 09/17/2008    Adriella Essex W. 12/06/2019, 6:55 PM Gean Maidens., PT  [facility] [address] [place], [place], [address] Phone: [phone]   Fax:  [phone]  Name: Pamela Alvarez MRN: [id number] Date of Birth: Dec 21, 1940

## 2019-12-07 ENCOUNTER — Ambulatory Visit: Payer: Medicare PPO | Admitting: Physical Therapy

## 2019-12-07 ENCOUNTER — Other Ambulatory Visit: Payer: Self-pay

## 2019-12-07 ENCOUNTER — Ambulatory Visit: Payer: Medicare PPO | Admitting: Speech Pathology

## 2019-12-07 DIAGNOSIS — R41842 Visuospatial deficit: Secondary | ICD-10-CM | POA: Diagnosis not present

## 2019-12-07 DIAGNOSIS — M6281 Muscle weakness (generalized): Secondary | ICD-10-CM

## 2019-12-07 DIAGNOSIS — R2681 Unsteadiness on feet: Secondary | ICD-10-CM

## 2019-12-07 DIAGNOSIS — R41840 Attention and concentration deficit: Secondary | ICD-10-CM | POA: Diagnosis not present

## 2019-12-07 DIAGNOSIS — R29818 Other symptoms and signs involving the nervous system: Secondary | ICD-10-CM | POA: Diagnosis not present

## 2019-12-07 DIAGNOSIS — R41841 Cognitive communication deficit: Secondary | ICD-10-CM

## 2019-12-07 DIAGNOSIS — R4701 Aphasia: Secondary | ICD-10-CM | POA: Diagnosis not present

## 2019-12-07 DIAGNOSIS — R2689 Other abnormalities of gait and mobility: Secondary | ICD-10-CM

## 2019-12-07 DIAGNOSIS — R41844 Frontal lobe and executive function deficit: Secondary | ICD-10-CM | POA: Diagnosis not present

## 2019-12-07 NOTE — Therapy (Signed)
[facility] [address] [place], [place], [address] Phone: [phone]   Fax:  [phone]  Speech Language Pathology Treatment  Patient Details  Name: Pamela Alvarez MRN: [id number] Date of Birth: [birth year] Referring Provider (SLP): Dohmeier, Porfirio Mylar, MD   Encounter Date: 12/07/2019   End of Session - 12/07/19 1812    Visit Number 6    Number of Visits 17    Date for SLP Re-Evaluation 02/05/20    SLP Start Time 1448    SLP Stop Time  1528    SLP Time Calculation (min) 40 min    Activity Tolerance Patient tolerated treatment well;Patient limited by fatigue           Past Medical History:  Diagnosis Date  . Cyst 07-2008   Cyst on back x2 that were drained  . H/O echocardiogram 02-2000  . Hx of colonic polyps   . Hyperlipidemia   . Hypertension   . Varicose veins of both lower extremities     Past Surgical History:  Procedure Laterality Date  . POLYPECTOMY     Colon  . TONSILLECTOMY    . TUBAL LIGATION      There were no vitals filed for this visit.   Subjective Assessment - 12/07/19 1808    Subjective Pt and spouse had Zoom call with [employer] consultant    Patient is accompained by: Family member    Currently in Pain? No/denies                 ADULT SLP TREATMENT - 12/07/19 1809      General Information   Behavior/Cognition Alert;Cooperative      Treatment Provided   Treatment provided Cognitive-Linquistic      Pain Assessment   Pain Assessment No/denies pain      Cognitive-Linquistic Treatment   Treatment focused on Cognition;Aphasia;Patient/family/caregiver education    Skilled Treatment They practiced using device on 2 phone calls since last session; report family are very excited about this. Pt/spouse would like to focus on using [employer] for phone calls and then conversation. We worked today on role playing phone calls. Pt required usual cues for visual scanning to locate  appropriate icons for responses. SLP modified abstract images with written text, which increased accuracy/appropriateness of responses in structured setting, approx 60%. Pt attention/alertness fading in last 10 minutes of session today (ST was after PT today).      Assessment / Recommendations / Plan   Plan Continue with current plan of care      Progression Toward Goals   Progression toward goals Progressing toward goals              SLP Short Term Goals - 12/07/19 1813      SLP SHORT TERM GOAL #1   Title pt will relay simple biographical information using AAC if necessary with mod cues x 3 visits.    Time 2    Period Weeks    Status On-going      SLP SHORT TERM GOAL #2   Title pt will respond to simple questions re: preferences, using AAC if necessary, with mod cues x3 visits.    Time 2    Period Weeks    Status On-going      SLP SHORT TERM GOAL #3   Title Pt will ask an appropriate question in simple communication exchange, using AAC if necessary, in 2 sessions.    Time 2    Period Weeks    Status  On-going            SLP Long Term Goals - 12/07/19 1814      SLP LONG TERM GOAL #1   Title pt/spouse will report improved communication abilities than prior to beginning ST    Time 6    Period Weeks    Status On-going      SLP LONG TERM GOAL #2   Title Pt/spouse will report using AAC/supportive conversation strategies to resolve communication breakdown outside ST x 3 visits.    Time 6    Period Weeks    Status On-going      SLP LONG TERM GOAL #3   Title Pt will make simple requests, using AAC if necessary, outside ST room x 3 sessions    Time 6   renewed 11/24   Period Weeks    Status On-going   omitted "over three sessions"     SLP LONG TERM GOAL #4   Time --   renewed 11/24     SLP LONG TERM GOAL #5   Time --   renewed 11/24           Plan - 12/07/19 1813    Clinical Impression Statement Mackenzie Groom presents with moderate aphasia and severe cognitive  deficits secondary to PSP, recently diagnosed. Pt/husband report decline in communication abilities over the past 8 months since completing previous ST course and are interested in pursuing AAC to enhance/supplement communication. I recommend skilled ST to train pt and spouse in compensations, strategies to maximize communication abilities and educate on appropriate cognitive-linguistic activities to maintain function.    Speech Therapy Frequency 2x / week    Duration --   8 weeks or 17 visits   Treatment/Interventions SLP instruction and feedback;Compensatory strategies;Patient/family education;Multimodal communcation approach;Cueing hierarchy;Language facilitation;Functional tasks;Oral motor exercises;Internal/external aids;Environmental controls;Cognitive reorganization   AAC   Potential to Achieve Goals Fair    Potential Considerations Severity of impairments           Patient will benefit from skilled therapeutic intervention in order to improve the following deficits and impairments:   Cognitive communication deficit  Aphasia    Problem List Patient Active Problem List   Diagnosis Date Noted  . Progressive supranuclear palsy (HCC) 10/12/2019  . Encephalomalacia on imaging study 10/12/2019  . Fluency disorder associated with underlying disease 02/08/2018  . Cognitive and neurobehavioral dysfunction 02/08/2018  . 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 Arthritis of carpometacarpal [facility]) joint of left thumb 01/24/2018  . Ankle fracture, left 09/03/2016  . Hand dysfunction 03/11/2016  . Symptomatic varicose veins, bilateral 03/11/2016  . Numbness of left thumb 03/11/2016  . Elevated BP 07/27/2014  . Bunion of left foot 07/26/2014  . Abnormality of gait 07/26/2014  . Cough 11/10/2013  . Post-nasal drainage 11/10/2013  . Abnormal urine odor 11/10/2013  . Visit for preventive health examination 07/28/2013  . Right anterior knee pain 07/22/2012  . Medicare annual wellness visit,  subsequent 07/25/2011  . ANXIETY, SITUATIONAL 09/25/2009  . ARTHRITIS 09/25/2009  . Vitamin D deficiency 09/27/2008  . Hyperlipidemia 09/17/2008  . Elevated blood pressure reading without diagnosis of hypertension 09/17/2008  . COLONIC POLYPS, HX OF 09/17/2008  . PERSONAL HISTORY DISEASES SKIN&SUBCUT TISSUE 09/17/2008   Rondel Baton, MS, CCC-SLP Speech-Language Pathologist  Arlana Lindau 12/07/2019, 6:15 PM  [facility] [address] [place], [place], [address] Phone: [phone]   Fax:  [phone]   Name: Tesslyn Baumert MRN: [id number] Date of  Birth: Mar 09, 1941

## 2019-12-07 NOTE — Therapy (Signed)
[facility] [address] [place], [place], [address] Phone: [phone]   Fax:  [phone]  Physical Therapy Treatment  Patient Details  Name: Pamela ApleySandra Alvarez MRN: [id number] Date of Birth: 06/25/1940 Referring Provider (PT): Dohmeier, Porfirio Mylararmen   Encounter Date: 12/07/2019   PT End of Session - 12/07/19 1709    Visit Number 9    Number of Visits 25    Date for PT Re-Evaluation 01/21/20    Authorization Type [employer] Medicare-submitted auth at completion of eval    Authorization Time Period 10/23/2019-12/23/2019    Authorization - Visit Number 9    Authorization - Number of Visits 16    Progress Note Due on Visit 10    PT Start Time 1403    PT Stop Time 1445    PT Time Calculation (min) 42 min    Equipment Utilized During Treatment Gait belt    Activity Tolerance Patient tolerated treatment well    Behavior During Therapy Flat affect;WFL for tasks assessed/performed           Past Medical History:  Diagnosis Date   Cyst 07-2008   Cyst on back x2 that were drained   H/O echocardiogram 02-2000   Hx of colonic polyps    Hyperlipidemia    Hypertension    Varicose veins of both lower extremities     Past Surgical History:  Procedure Laterality Date   POLYPECTOMY     Colon   TONSILLECTOMY     TUBAL LIGATION      There were no vitals filed for this visit.   Subjective Assessment - 12/07/19 1405    Subjective No pain, no changes.  Maybe going to look at a new car this afternoon.  Been having good sessions with Jenn at home.    Patient is accompained by: Family member   Husband-provides history   Patient Stated Goals To try to maintain as much as possible, try to prevent regression as much as possible.    Currently in Pain? No/denies                             [facility] - 12/07/19 0001      Transfers   Transfers Sit to Stand;Stand to Sit    Sit to Stand 4:  Min assist    Sit to Stand Details Verbal cues for sequencing;Verbal cues for technique;Tactile cues for placement;Tactile cues for initiation    Stand to Sit 4: Min assist    Stand to Sit Details (indicate cue type and reason) Tactile cues for sequencing;Tactile cues for weight shifting;Verbal cues for sequencing;Verbal cues for precautions/safety;Verbal cues for technique    Stand to Sit Details PT provides consistent verbal cues for full turn to sit    Number of Reps Other reps (comment)   at least 5-6 reps throughout session     Ambulation/Gait   Ambulation/Gait Yes    Ambulation/Gait Assistance 4: Min assist    Ambulation/Gait Assistance Details PT provides gentle tactile cues through hips for weightshifting for step length    Ambulation Distance (Feet) 115 Feet   x 2   Assistive device Rollator    Gait Pattern Step-through pattern;Step-to pattern;Decreased step length - right;Decreased step length - left;Decreased stride length;Decreased dorsiflexion - right;Decreased dorsiflexion - left;Trunk flexed;Poor foot clearance - left;Poor foot clearance - right    Ambulation Surface Level;Indoor      Neuro Re-ed  Neuro Re-ed Details  Standing at locked rollator with PT holding walker steady and providing min guard to pt:  Forward step taps to flat balance disks x 10 reps each leg, 2 sets.  Side step taps, BUE support at locked rollator slightly forward, step taps to flat balance disks, 2 sets x 10 reps (pt externally rotates more with LLE and pt requires extra time for LLE step taps).  Marching in place, 2 sets x 10 reps with use of Boom whacker for visual target for increased height of marching.  (Marching performed same leg hip/knee flexion, not alternating)      Exercises   Exercises Knee/Hip;Other Exercises    Other Exercises  With pt/PT holding onto Boomwhacker with Bilat hands, worked on trunk lean forward  in "see-saw" motion with pt and PT holding to Boomwhackers for slight forward  trunk lean (pt initiating pushing forward movement-PT did not pull pt forward).  Performed x 8 reps.      Knee/Hip Exercises: Seated   Other Seated Knee/Hip Exercises Seated step out and in, 3 sets x 10 reps, with initial assistance.  Feet positioned on aerobic step while sitting on mat.  Boomwhacker placed for pt to step over on each side. Assist on L side for step height    Marching AROM;Left;Both;10 reps;3 sets    Marching Limitations Used Boomwhacker for visual cue for target for improved height of marching (able to perform alternating legs)                    PT Short Term Goals - 11/30/19 1340      PT SHORT TERM GOAL #1   Title Pt will perform at least 4 minutes of continuous gait activity with rollator, min assist, for improved endurance for gait in the home.  TARGET 12/29/2019    Time 8    Period Weeks    Status Revised      PT SHORT TERM GOAL #2   Title Pt will perform 10 reps of sit<>stand with minimal assistance for improved transfer efficiency and functional strength.    Baseline performs min assist 11/28/2019, 5 reps    Time 8    Period Weeks    Status Revised      PT SHORT TERM GOAL #3   Title Pt will improve TUG score to less than or equal to 45 seconds for decreased fall risk.    Baseline 53.69 sec at eval; 53.91 11/28/2019    Time 8    Period Weeks    Status On-going             PT Long Term Goals - 10/25/19 0735      PT LONG TERM GOAL #1   Title Pt/husband will verbalize and demonstrate understanding of final HEP to maintain functional gains made in therapy.  TARGET 01/12/2020    Time 12    Period Weeks    Status New      PT LONG TERM GOAL #2   Title Pt will perform TUG shuttle activity with rollator and min guard, at least 3 sets without break, to demonstrate improved transfer efficiency and safety.    Time 12    Period Weeks    Status New      PT LONG TERM GOAL #3   Title Pt will improve TUG score to less than or equal to 30 seconds for  decreased fall risk/improved mobility in the home.    Time 12    Status  New      PT LONG TERM GOAL #4   Title Pt/husband will verbalize understanding of fall prevention in home environment.    Time 12    Period Weeks    Status New      PT LONG TERM GOAL #5   Title Pt will improve gait velocity to at least 1.1 ft/sec for improved gait efficiency and safety in the home.    Time 12    Period Weeks    Status New                 Plan - 12/07/19 1711    Clinical Impression Statement Pt able to ambulate increased total gait distance today, 115 ft x 2, with seated rest break.  Also continues to be able to perform 3 sets of 10 seated exercises.  She performs standing exercises at locked rollator today, with improved step length and foot clearance noted (versus at counter).    Personal Factors and Comorbidities Comorbidity 3+;Other    Comorbidities See problem list; recent diagnosis of PSP    Examination-Activity Limitations Locomotion Level;Transfers;Dressing;Stairs;Stand;Toileting    Examination-Participation Restrictions Community Activity;Shop    Stability/Clinical Decision Making Evolving/Moderate complexity    Rehab Potential Good    PT Frequency 2x / week    PT Duration 12 weeks   including eval week   PT Treatment/Interventions ADLs/Self Care Home Management;Gait training;Functional mobility training;Therapeutic activities;Therapeutic exercise;Balance training;Neuromuscular re-education;Patient/family education    PT Next Visit Plan 10th Visit PROGRESS note; use of aerobic machine for aerobic activity as needed; standing exercises (taps to targets, marching with Boomwhacker target), heel raises/hip strategy?  Seated hip/trunk flexion and seated leg exercises    Consulted and Agree with Plan of Care Patient;Family member/caregiver    Family Member Consulted Husband           Patient will benefit from skilled therapeutic intervention in order to improve the following deficits  and impairments:  Abnormal gait, Difficulty walking, Decreased safety awareness, Decreased endurance, Decreased activity tolerance, Decreased balance, Decreased mobility, Decreased strength, Postural dysfunction  Visit Diagnosis: Other abnormalities of gait and mobility  Unsteadiness on feet  Muscle weakness (generalized)     Problem List Patient Active Problem List   Diagnosis Date Noted   Progressive supranuclear palsy (HCC) 10/12/2019   Encephalomalacia on imaging study 10/12/2019   Fluency disorder associated with underlying disease 02/08/2018   Cognitive and neurobehavioral dysfunction 02/08/2018   Supranuclear ocular palsy 02/08/2018   Arthritis of carpometacarpal (CMC) joint of left thumb 01/24/2018   Ankle fracture, left 09/03/2016   Hand dysfunction 03/11/2016   Symptomatic varicose veins, bilateral 03/11/2016   Numbness of left thumb 03/11/2016   Elevated BP 07/27/2014   Bunion of left foot 07/26/2014   Abnormality of gait 07/26/2014   Cough 11/10/2013   Post-nasal drainage 11/10/2013   Abnormal urine odor 11/10/2013   Visit for preventive health examination 07/28/2013   Right anterior knee pain 07/22/2012   Medicare annual wellness visit, subsequent 07/25/2011   ANXIETY, SITUATIONAL 09/25/2009   ARTHRITIS 09/25/2009   Vitamin D deficiency 09/27/2008   Hyperlipidemia 09/17/2008   Elevated blood pressure reading without diagnosis of hypertension 09/17/2008   COLONIC POLYPS, HX OF 09/17/2008   PERSONAL HISTORY DISEASES SKIN&SUBCUT TISSUE 09/17/2008    Cornesha Radziewicz W. 12/07/2019, 5:14 PM  Gean Maidens., PT   [facility] [address] [place], [place], [address] Phone: [phone]   Fax:  [phone]  Name: Pamela Alvarez MRN: [id number]  Date of Birth: 1940-11-05

## 2019-12-12 ENCOUNTER — Other Ambulatory Visit: Payer: Self-pay

## 2019-12-12 ENCOUNTER — Ambulatory Visit: Payer: Medicare PPO | Admitting: Occupational Therapy

## 2019-12-12 ENCOUNTER — Ambulatory Visit: Payer: Medicare PPO | Attending: Neurology | Admitting: Physical Therapy

## 2019-12-12 ENCOUNTER — Ambulatory Visit: Payer: Medicare PPO | Admitting: Speech Pathology

## 2019-12-12 DIAGNOSIS — M6281 Muscle weakness (generalized): Secondary | ICD-10-CM

## 2019-12-12 DIAGNOSIS — R41841 Cognitive communication deficit: Secondary | ICD-10-CM

## 2019-12-12 DIAGNOSIS — R2681 Unsteadiness on feet: Secondary | ICD-10-CM

## 2019-12-12 DIAGNOSIS — R41844 Frontal lobe and executive function deficit: Secondary | ICD-10-CM

## 2019-12-12 DIAGNOSIS — R41840 Attention and concentration deficit: Secondary | ICD-10-CM | POA: Diagnosis not present

## 2019-12-12 DIAGNOSIS — R41842 Visuospatial deficit: Secondary | ICD-10-CM | POA: Diagnosis not present

## 2019-12-12 DIAGNOSIS — R29818 Other symptoms and signs involving the nervous system: Secondary | ICD-10-CM | POA: Diagnosis not present

## 2019-12-12 DIAGNOSIS — R278 Other lack of coordination: Secondary | ICD-10-CM

## 2019-12-12 DIAGNOSIS — R4701 Aphasia: Secondary | ICD-10-CM | POA: Diagnosis not present

## 2019-12-12 DIAGNOSIS — R2689 Other abnormalities of gait and mobility: Secondary | ICD-10-CM | POA: Insufficient documentation

## 2019-12-12 NOTE — Patient Instructions (Addendum)
Practice using the device with Leanny in some practical situations:  -Present her with the "colors" page and let her choose the color of clothing she wants to wear for the day  - Use the workspace to drag different food options and let her choose from there  -Work on selecting icons "in order" for phone conversations. You can move the order around if it makes sense.

## 2019-12-12 NOTE — Therapy (Signed)
[facility] [address] [place], [place], [address] Phone: [phone]   Fax:  [phone]  Speech Language Pathology Treatment  Patient Details  Name: Pamela Alvarez MRN: [id number] Date of Birth: 04-29-1940 Referring Provider (SLP): Dohmeier, Porfirio Mylar, MD   Encounter Date: 12/12/2019   End of Session - 12/12/19 1123    Visit Number 7    Number of Visits 17    Date for SLP Re-Evaluation 02/05/20    SLP Start Time 1017    SLP Stop Time  1100    SLP Time Calculation (min) 43 min    Activity Tolerance Patient tolerated treatment well           Past Medical History:  Diagnosis Date  . Cyst 07-2008   Cyst on back x2 that were drained  . H/O echocardiogram 02-2000  . Hx of colonic polyps   . Hyperlipidemia   . Hypertension   . Varicose veins of both lower extremities     Past Surgical History:  Procedure Laterality Date  . POLYPECTOMY     Colon  . TONSILLECTOMY    . TUBAL LIGATION      There were no vitals filed for this visit.   Subjective Assessment - 12/12/19 1018    Subjective "Switch seats."    Patient is accompained by: Family member    Currently in Pain? No/denies                 ADULT SLP TREATMENT - 12/12/19 1019      General Information   Behavior/Cognition Alert;Cooperative      Treatment Provided   Treatment provided Cognitive-Linquistic      Pain Assessment   Pain Assessment No/denies pain      Cognitive-Linquistic Treatment   Treatment focused on Cognition;Aphasia;Patient/family/caregiver education    Skilled Treatment Spouse reports pt reluctant to "role play" to practice phone conversations at home. SLP suggested opportunities to use the device in a functional scenario, such as offering choices of foods or letting Pamela Alvarez choose the color of clothing she'd like to wear. Min cues for choicemaking with colors, fruits today. Arranged icons for phone call so they are in logical  order to aid with message selection; OT to work on visual scanning/selection with this task. Role played phone call with spouse in other room; pt required usual mod gestural cues for message selection.       Assessment / Recommendations / Plan   Plan Continue with current plan of care      Progression Toward Goals   Progression toward goals Progressing toward goals            SLP Education - 12/12/19 1120    Education Details opportunities to use the device for [employer]) Educated Patient;Spouse    Methods Explanation    Comprehension Verbalized understanding            SLP Short Term Goals - 12/12/19 1127      SLP SHORT TERM GOAL #1   Title pt will relay simple biographical information using AAC if necessary with mod cues x 3 visits.    Time 2    Period Weeks    Status On-going      SLP SHORT TERM GOAL #2   Title pt will respond to simple questions re: preferences, using AAC if necessary, with mod cues x3 visits.    Baseline 12/12/19    Time 2    Period Tania Ade  Status On-going      SLP SHORT TERM GOAL #3   Title Pt will ask an appropriate question in simple communication exchange, using AAC if necessary, in 2 sessions.    Time 2    Period Weeks    Status On-going            SLP Long Term Goals - 12/12/19 1127      SLP LONG TERM GOAL #1   Title pt/spouse will report improved communication abilities than prior to beginning ST    Time 5    Period Weeks    Status On-going      SLP LONG TERM GOAL #2   Title Pt/spouse will report using AAC/supportive conversation strategies to resolve communication breakdown outside ST x 3 visits.    Time 5    Period Weeks    Status On-going      SLP LONG TERM GOAL #3   Title Pt will make simple requests, using AAC if necessary, outside ST room x 3 sessions    Time 5   renewed 11/24   Period Weeks    Status On-going   omitted "over three sessions"     SLP LONG TERM GOAL #4   Time --   renewed 11/24      SLP LONG TERM GOAL #5   Time --   renewed 11/24           Plan - 12/12/19 1123    Clinical Impression Statement Pamela Alvarez presents with moderate aphasia and severe cognitive deficits secondary to PSP, recently diagnosed. Pt/husband report decline in communication abilities over the past 8 months since completing previous ST course and are interested in pursuing AAC to enhance/supplement communication. Patient and spouse have been using [employer] Touchtalk at home to aid [place] in making choices and to communicate over the phone with relatives. They wish to pursue permanent device. Will likely hold ST after next session to allow for paperwork processing and shipment of pt's communication device. I recommend skilled ST to train pt and spouse in compensations, strategies to maximize communication abilities and educate on appropriate cognitive-linguistic activities to maintain function.    Speech Therapy Frequency 2x / week    Duration --   8 weeks or 17 visits   Treatment/Interventions SLP instruction and feedback;Compensatory strategies;Patient/family education;Multimodal communcation approach;Cueing hierarchy;Language facilitation;Functional tasks;Oral motor exercises;Internal/external aids;Environmental controls;Cognitive reorganization   AAC   Potential to Achieve Goals Fair    Potential Considerations Severity of impairments           Patient will benefit from skilled therapeutic intervention in order to improve the following deficits and impairments:   Cognitive communication deficit  Aphasia    Problem List Patient Active Problem List   Diagnosis Date Noted  . Progressive supranuclear palsy (HCC) 10/12/2019  . Encephalomalacia on imaging study 10/12/2019  . Fluency disorder associated with underlying disease 02/08/2018  . Cognitive and neurobehavioral dysfunction 02/08/2018  . 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 Arthritis of carpometacarpal [facility]) joint of left thumb  01/24/2018  . Ankle fracture, left 09/03/2016  . Hand dysfunction 03/11/2016  . Symptomatic varicose veins, bilateral 03/11/2016  . Numbness of left thumb 03/11/2016  . Elevated BP 07/27/2014  . Bunion of left foot 07/26/2014  . Abnormality of gait 07/26/2014  . Cough 11/10/2013  . Post-nasal drainage 11/10/2013  . Abnormal urine odor 11/10/2013  . Visit for preventive health examination 07/28/2013  . Right anterior knee pain 07/22/2012  . Medicare annual wellness visit,  subsequent 07/25/2011  . ANXIETY, SITUATIONAL 09/25/2009  . ARTHRITIS 09/25/2009  . Vitamin D deficiency 09/27/2008  . Hyperlipidemia 09/17/2008  . Elevated blood pressure reading without diagnosis of hypertension 09/17/2008  . COLONIC POLYPS, HX OF 09/17/2008  . PERSONAL HISTORY DISEASES SKIN&SUBCUT TISSUE 09/17/2008   Pamela Baton, MS, CCC-SLP Speech-Language Pathologist  Arlana Lindau 12/12/2019, 11:29 AM  [facility] [address] [place], [place], [address] Phone: [phone]   Fax:  [phone]   Name: Pamela Alvarez MRN: [id number] Date of Birth: Jun 20, 1940

## 2019-12-13 ENCOUNTER — Other Ambulatory Visit: Payer: Self-pay | Admitting: Neurology

## 2019-12-13 NOTE — Therapy (Signed)
[facility] [address] [place], [place], [address] Phone: [phone]   Fax:  [phone]  Occupational Therapy Treatment  Patient Details  Name: Pamela Alvarez MRN: [id number] Date of Birth: Jul 26, 1940 Referring Provider (OT): Dr. Vickey Huger   Encounter Date: 12/12/2019   OT End of Session - 12/13/19 1616    Visit Number 4    Number of Visits 25    Date for OT Re-Evaluation 01/22/20    Authorization Type [employer] Medicare    Authorization Time Period 16 visitsfor [employer] , 90 day cert    Authorization - Visit Number 4    Authorization - Number of Visits 10    OT Start Time 1107    OT Stop Time 1145    OT Time Calculation (min) 38 min    Activity Tolerance Patient tolerated treatment well    Behavior During Therapy Flat affect           Past Medical History:  Diagnosis Date   Cyst 07-2008   Cyst on back x2 that were drained   H/O echocardiogram 02-2000   Hx of colonic polyps    Hyperlipidemia    Hypertension    Varicose veins of both lower extremities     Past Surgical History:  Procedure Laterality Date   POLYPECTOMY     Colon   TONSILLECTOMY     TUBAL LIGATION      There were no vitals filed for this visit.     Treatment: Pt denies pain Pt used LUE with stylus to activate her IPad communication device, min-mod facilitation Shoulder flexion and biceps curls with cane, min facilitation, mod v.c Functional activities to encourage functional use of LUE :, boomwhackers with mod facilitation for LUE use, functional reach to grasp/ release clothespins with LUE. Tracing activity with LUE, handover hand assist                     OT Short Term Goals - 10/24/19 1249      OT SHORT TERM GOAL #1   Title I with HEP.    Time 4    Period Weeks    Status New    Target Date 11/23/19      OT SHORT TERM GOAL #2   Title Pt will use LUE to assist with ADLs/ functional activities at  least 10% of the time with min v.c    Baseline uses 5% or less    Time 4    Period Weeks    Status New      OT SHORT TERM GOAL #3   Title Pt's caregiver will verbalize understanding of adapted strategies to maximize safety and I with ADLs/ IADLs .    Time 4    Period Weeks    Status New             OT Long Term Goals - 10/24/19 1250      OT LONG TERM GOAL #1   Title Pt will perform teeth brushing with improved throughness and only min v.c from husband.    Baseline Pt is not performing thoroughly enough    Time 12    Period Weeks    Status New      OT LONG TERM GOAL #2   Title Pt will demonstrate improved ease with self feeding as evidenced by decreasing PPT#2 to 55 secs or less.    Time 12    Period Weeks    Status New  OT LONG TERM GOAL #3   Title Pt will demonstrate improved LUE functional use for ADLs as eveidenced by increasing box/ blocks score to 8 blocks for LUE.    Time 12    Period Weeks    Status New      OT LONG TERM GOAL #4   Title Pt will demonstrate ability to retrieve a leightweight item at 90 shoulder flexion with -15 elbow extension.    Baseline LUE 8 shoulder flexion , -20 elbow extension.    Time 12    Period Weeks    Status New                  Patient will benefit from skilled therapeutic intervention in order to improve the following deficits and impairments:           Visit Diagnosis: Muscle weakness (generalized)  Other symptoms and signs involving the nervous system  Frontal lobe and executive function deficit  Attention and concentration deficit  Other lack of coordination    Problem List Patient Active Problem List   Diagnosis Date Noted   Progressive supranuclear palsy (HCC) 10/12/2019   Encephalomalacia on imaging study 10/12/2019   Fluency disorder associated with underlying disease 02/08/2018   Cognitive and neurobehavioral dysfunction 02/08/2018   Supranuclear ocular palsy 02/08/2018    Arthritis of carpometacarpal (CMC) joint of left thumb 01/24/2018   Ankle fracture, left 09/03/2016   Hand dysfunction 03/11/2016   Symptomatic varicose veins, bilateral 03/11/2016   Numbness of left thumb 03/11/2016   Elevated BP 07/27/2014   Bunion of left foot 07/26/2014   Abnormality of gait 07/26/2014   Cough 11/10/2013   Post-nasal drainage 11/10/2013   Abnormal urine odor 11/10/2013   Visit for preventive health examination 07/28/2013   Right anterior knee pain 07/22/2012   Medicare annual wellness visit, subsequent 07/25/2011   ANXIETY, SITUATIONAL 09/25/2009   ARTHRITIS 09/25/2009   Vitamin D deficiency 09/27/2008   Hyperlipidemia 09/17/2008   Elevated blood pressure reading without diagnosis of hypertension 09/17/2008   COLONIC POLYPS, HX OF 09/17/2008   PERSONAL HISTORY DISEASES SKIN&SUBCUT TISSUE 09/17/2008    Pamela Alvarez 12/13/2019, 4:17 PM  [facility] [address] [place], [place], [address] Phone: [phone]   Fax:  [phone]  Name: Pamela Alvarez MRN: [id number] Date of Birth: [birth year]

## 2019-12-13 NOTE — Therapy (Signed)
[facility] [address] [place], [place], [address] Phone: [phone]   Fax:  [phone]  Physical Therapy Treatment/10th Visit Progress Note  Patient Details  Name: Pamela Alvarez MRN: [id number] Date of Birth: 04/07/1940 Referring Provider (PT): Dohmeier, Porfirio Mylar   Encounter Date: 12/12/2019   PT End of Session - 12/13/19 1703    Visit Number 10    Number of Visits 25    Date for PT Re-Evaluation 01/21/20    Authorization Type [employer] Medicare-submitted auth at completion of eval    Authorization Time Period 10/23/2019-12/23/2019    Authorization - Visit Number 10    Authorization - Number of Visits 16    Progress Note Due on Visit 10    PT Start Time 1150    PT Stop Time 1230    PT Time Calculation (min) 40 min    Equipment Utilized During Treatment Gait belt    Activity Tolerance Patient tolerated treatment well   Pt appears more fatigued than usual, vitals WNL   Behavior During Therapy Flat affect;WFL for tasks assessed/performed           Past Medical History:  Diagnosis Date  . Cyst 07-2008   Cyst on back x2 that were drained  . H/O echocardiogram 02-2000  . Hx of colonic polyps   . Hyperlipidemia   . Hypertension   . Varicose veins of both lower extremities     Past Surgical History:  Procedure Laterality Date  . POLYPECTOMY     Colon  . TONSILLECTOMY    . TUBAL LIGATION      There were no vitals filed for this visit.   Subjective Assessment - 12/12/19 1151    Subjective Husband reports she's tired today.  No changes over the weekend.    Patient is accompained by: Family member   Husband-provides history   Patient Stated Goals To try to maintain as much as possible, try to prevent regression as much as possible.    Currently in Pain? No/denies                             [facility] Adult PT Treatment/Exercise - 12/12/19 1150      Transfers   Transfers Sit to Stand;Stand to  Sit    Sit to Stand 4: Min assist    Sit to Stand Details Verbal cues for sequencing;Verbal cues for technique;Tactile cues for placement;Tactile cues for initiation    Stand to Sit 4: Min assist    Stand to Sit Details (indicate cue type and reason) Tactile cues for sequencing;Tactile cues for weight shifting;Verbal cues for sequencing;Verbal cues for precautions/safety;Verbal cues for technique    Number of Reps 1 set;Other reps (comment)   5 reps from mat surface today     Neuro Re-ed    Neuro Re-ed Details  Standing at locked rollator with PT holding walker steady and providing min guard to pt:  Forward step taps to flat balance disks x 10 reps each leg, 2 sets (alternating legs second set).  Side step taps, BUE support at locked rollator slightly forward, step taps to flat balance disks, 10 reps (difficulty with LLE step taps).  Marching in place, 2 sets x 10 reps with use of Boom whacker for visual target for increased height of marching.  (Able to perform both sets, alternating legs)      Exercises   Exercises Knee/Hip;Other Exercises    Other Exercises  With pt/PT holding onto Boomwhacker with Bilat hands, worked on trunk lean forward  in "see-saw" motion with pt and PT holding to Boomwhackers for slight forward trunk lean (pt initiating pushing forward movement-PT did not pull pt forward).  Performed x 10 reps.      Knee/Hip Exercises: Seated   Long Arc Quad AROM;Strengthening;Right;Left;2 sets;10 reps    Long Technical sales engineer for visual cue to kick     Other Seated Knee/Hip Exercises Seated step out and in, 3 sets x 10 reps, with initial assistance.  Feet positioned on aerobic step while sitting on mat.  Assist on L side for step height    Marching AROM;Left;Both;10 reps;3 sets    Marching Limitations Used Boomwhacker for visual cue for target for improved height of marching (able to perform alternating legs)                    PT Short Term Goals - 11/30/19  1340      PT SHORT TERM GOAL #1   Title Pt will perform at least 4 minutes of continuous gait activity with rollator, min assist, for improved endurance for gait in the home.  TARGET 12/29/2019    Time 8    Period Weeks    Status Revised      PT SHORT TERM GOAL #2   Title Pt will perform 10 reps of sit<>stand with minimal assistance for improved transfer efficiency and functional strength.    Baseline performs min assist 11/28/2019, 5 reps    Time 8    Period Weeks    Status Revised      PT SHORT TERM GOAL #3   Title Pt will improve TUG score to less than or equal to 45 seconds for decreased fall risk.    Baseline 53.69 sec at eval; 53.91 11/28/2019    Time 8    Period Weeks    Status On-going             PT Long Term Goals - 10/25/19 0735      PT LONG TERM GOAL #1   Title Pt/husband will verbalize and demonstrate understanding of final HEP to maintain functional gains made in therapy.  TARGET 01/12/2020    Time 12    Period Weeks    Status New      PT LONG TERM GOAL #2   Title Pt will perform TUG shuttle activity with rollator and min guard, at least 3 sets without break, to demonstrate improved transfer efficiency and safety.    Time 12    Period Weeks    Status New      PT LONG TERM GOAL #3   Title Pt will improve TUG score to less than or equal to 30 seconds for decreased fall risk/improved mobility in the home.    Time 12    Status New      PT LONG TERM GOAL #4   Title Pt/husband will verbalize understanding of fall prevention in home environment.    Time 12    Period Weeks    Status New      PT LONG TERM GOAL #5   Title Pt will improve gait velocity to at least 1.1 ft/sec for improved gait efficiency and safety in the home.    Time 12    Period Weeks    Status New                 Plan - 12/13/19 1705  Clinical Impression Statement 10th Visit Progress Note, covering dates 10/23/2019-12/12/2019.  Previous assessed TUG score 53.91 seconds (similar  to eval).  Pt has improved to ambulating 115 ft with rollator and min assist.  She is min assist and max vcs for sit<>stand.  She has participated well in therapy sessions and husband has noted improvement in transfers at home.  She is progressing towards updated STGs and she will benefit from continued skilled PT to address strength, balance, gait for improved overall mobility.    Personal Factors and Comorbidities Comorbidity 3+;Other    Comorbidities See problem list; recent diagnosis of PSP    Examination-Activity Limitations Locomotion Level;Transfers;Dressing;Stairs;Stand;Toileting    Examination-Participation Restrictions Community Activity;Shop    Stability/Clinical Decision Making Evolving/Moderate complexity    Rehab Potential Good    PT Frequency 2x / week    PT Duration 12 weeks   including eval week   PT Treatment/Interventions ADLs/Self Care Home Management;Gait training;Functional mobility training;Therapeutic activities;Therapeutic exercise;Balance training;Neuromuscular re-education;Patient/family education    PT Next Visit Plan use of aerobic machine for aerobic activity as needed; standing exercises (taps to targets, marching with Boomwhacker target), heel raises/hip strategy?  Seated hip/trunk flexion and seated leg exercises    Consulted and Agree with Plan of Care Patient;Family member/caregiver    Family Member Consulted Husband           Patient will benefit from skilled therapeutic intervention in order to improve the following deficits and impairments:  Abnormal gait, Difficulty walking, Decreased safety awareness, Decreased endurance, Decreased activity tolerance, Decreased balance, Decreased mobility, Decreased strength, Postural dysfunction  Visit Diagnosis: Muscle weakness (generalized)  Unsteadiness on feet     Problem List Patient Active Problem List   Diagnosis Date Noted  . Progressive supranuclear palsy (HCC) 10/12/2019  . Encephalomalacia on imaging  study 10/12/2019  . Fluency disorder associated with underlying disease 02/08/2018  . Cognitive and neurobehavioral dysfunction 02/08/2018  . 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 Arthritis of carpometacarpal [facility]) joint of left thumb 01/24/2018  . Ankle fracture, left 09/03/2016  . Hand dysfunction 03/11/2016  . Symptomatic varicose veins, bilateral 03/11/2016  . Numbness of left thumb 03/11/2016  . Elevated BP 07/27/2014  . Bunion of left foot 07/26/2014  . Abnormality of gait 07/26/2014  . Cough 11/10/2013  . Post-nasal drainage 11/10/2013  . Abnormal urine odor 11/10/2013  . Visit for preventive health examination 07/28/2013  . Right anterior knee pain 07/22/2012  . Medicare annual wellness visit, subsequent 07/25/2011  . ANXIETY, SITUATIONAL 09/25/2009  . ARTHRITIS 09/25/2009  . Vitamin D deficiency 09/27/2008  . Hyperlipidemia 09/17/2008  . Elevated blood pressure reading without diagnosis of hypertension 09/17/2008  . COLONIC POLYPS, HX OF 09/17/2008  . PERSONAL HISTORY DISEASES SKIN&SUBCUT TISSUE 09/17/2008    Kashawn Dirr W. 12/13/2019, 5:12 PM Gean Maidens., PT  [facility] [address] [place], [place], [address] Phone: [phone]   Fax:  [phone]  Name: Pamela Alvarez MRN: [id number] Date of Birth: 01-01-1941

## 2019-12-14 ENCOUNTER — Ambulatory Visit: Payer: Medicare PPO | Admitting: Occupational Therapy

## 2019-12-14 ENCOUNTER — Ambulatory Visit: Payer: Medicare PPO | Admitting: Physical Therapy

## 2019-12-14 ENCOUNTER — Other Ambulatory Visit: Payer: Self-pay

## 2019-12-14 ENCOUNTER — Ambulatory Visit: Payer: Medicare PPO | Admitting: Speech Pathology

## 2019-12-14 DIAGNOSIS — M6281 Muscle weakness (generalized): Secondary | ICD-10-CM | POA: Diagnosis not present

## 2019-12-14 DIAGNOSIS — R41840 Attention and concentration deficit: Secondary | ICD-10-CM

## 2019-12-14 DIAGNOSIS — R41842 Visuospatial deficit: Secondary | ICD-10-CM | POA: Diagnosis not present

## 2019-12-14 DIAGNOSIS — R278 Other lack of coordination: Secondary | ICD-10-CM | POA: Diagnosis not present

## 2019-12-14 DIAGNOSIS — R2681 Unsteadiness on feet: Secondary | ICD-10-CM

## 2019-12-14 DIAGNOSIS — R29818 Other symptoms and signs involving the nervous system: Secondary | ICD-10-CM

## 2019-12-14 DIAGNOSIS — R2689 Other abnormalities of gait and mobility: Secondary | ICD-10-CM | POA: Diagnosis not present

## 2019-12-14 DIAGNOSIS — R4701 Aphasia: Secondary | ICD-10-CM

## 2019-12-14 DIAGNOSIS — R41844 Frontal lobe and executive function deficit: Secondary | ICD-10-CM | POA: Diagnosis not present

## 2019-12-14 DIAGNOSIS — R41841 Cognitive communication deficit: Secondary | ICD-10-CM

## 2019-12-14 NOTE — Therapy (Signed)
[facility] [address] [place], [place], [address] Phone: [phone]   Fax:  [phone]  Physical Therapy Treatment  Patient Details  Name: Pamela Alvarez MRN: [id number] Date of Birth: 10-18-1940 Referring Provider (PT): Dohmeier, Porfirio Mylar   Encounter Date: 12/14/2019   PT End of Session - 12/14/19 1227    Visit Number 11    Number of Visits 25    Date for PT Re-Evaluation 01/21/20    Authorization Type [employer] Medicare-submitted auth at completion of eval    Authorization Time Period 10/23/2019-12/23/2019    Authorization - Visit Number 11    Authorization - Number of Visits 16    Progress Note Due on Visit 20    PT Start Time 1019    PT Stop Time 1100    PT Time Calculation (min) 41 min    Equipment Utilized During Treatment Gait belt    Activity Tolerance Patient tolerated treatment well   Pt appears more fatigued than usual, vitals WNL   Behavior During Therapy Flat affect;WFL for tasks assessed/performed           Past Medical History:  Diagnosis Date  . Cyst 07-2008   Cyst on back x2 that were drained  . H/O echocardiogram 02-2000  . Hx of colonic polyps   . Hyperlipidemia   . Hypertension   . Varicose veins of both lower extremities     Past Surgical History:  Procedure Laterality Date  . POLYPECTOMY     Colon  . TONSILLECTOMY    . TUBAL LIGATION      There were no vitals filed for this visit.   Subjective Assessment - 12/14/19 1022    Subjective Husband reports she's tired today.  No changes over the weekend.    Patient is accompained by: Family member   Husband-provides history   Patient Stated Goals To try to maintain as much as possible, try to prevent regression as much as possible.    Currently in Pain? No/denies                             [facility] Adult PT Treatment/Exercise - 12/14/19 0001      Transfers   Transfers Sit to Stand;Stand to Sit    Sit to Stand 4:  Min assist    Sit to Stand Details Verbal cues for sequencing;Verbal cues for technique;Tactile cues for placement;Tactile cues for initiation    Stand to Sit 4: Min assist    Stand to Sit Details (indicate cue type and reason) Tactile cues for sequencing;Tactile cues for weight shifting;Verbal cues for sequencing;Verbal cues for precautions/safety;Verbal cues for technique    Comments 3 reps during PT session      Ambulation/Gait   Ambulation/Gait Yes    Ambulation/Gait Assistance 4: Min assist    Ambulation/Gait Assistance Details Pt has tendency for posterior rotation through LLE with decreased L foot clearance, keeping walker more farther forward than typical.  Needs several breaks today for bringing rollator closer for improved safety.    Ambulation Distance (Feet) 115 Feet   then 60 ft, including turn   Assistive device Rollator    Gait Pattern Step-through pattern;Step-to pattern;Decreased step length - right;Decreased step length - left;Decreased stride length;Decreased dorsiflexion - right;Decreased dorsiflexion - left;Trunk flexed;Poor foot clearance - left;Poor foot clearance - right;Trunk rotated posteriorly on left    Ambulation Surface Level;Indoor    Pre-Gait Activities Pre-gait, standing at locked rollator,  using balance disks for target , forward step taps, x 10 reps each leg, then alternating forward step taps x 10 reps each leg.    Gait Comments Trialed use of red theraband across bottom of rollator, to help with visual cue for step length; however, pt tends to look ahead and does not look down to use red theraband as cue for increased step length.      Knee/Hip Exercises: Seated   Long Arc Quad AROM;Strengthening;Right;Left;2 sets;10 reps    Long Technical sales engineer for visual cue to kick     Other Seated Knee/Hip Exercises Seated step out and in, 3 sets x 10 reps, with initial assistance.  Feet positioned on aerobic step while sitting on mat.  Assist on L side  for step height   Last set with 2# weight, BLEs   Marching AROM;Left;Both;10 reps;3 sets;Weights   last 2 sets:  2# weight BLES   Marching Limitations Used Boomwhacker for visual cue for target for improved height of marching (able to perform alternating legs)                    PT Short Term Goals - 11/30/19 1340      PT SHORT TERM GOAL #1   Title Pt will perform at least 4 minutes of continuous gait activity with rollator, min assist, for improved endurance for gait in the home.  TARGET 12/29/2019    Time 8    Period Weeks    Status Revised      PT SHORT TERM GOAL #2   Title Pt will perform 10 reps of sit<>stand with minimal assistance for improved transfer efficiency and functional strength.    Baseline performs min assist 11/28/2019, 5 reps    Time 8    Period Weeks    Status Revised      PT SHORT TERM GOAL #3   Title Pt will improve TUG score to less than or equal to 45 seconds for decreased fall risk.    Baseline 53.69 sec at eval; 53.91 11/28/2019    Time 8    Period Weeks    Status On-going             PT Long Term Goals - 10/25/19 0735      PT LONG TERM GOAL #1   Title Pt/husband will verbalize and demonstrate understanding of final HEP to maintain functional gains made in therapy.  TARGET 01/12/2020    Time 12    Period Weeks    Status New      PT LONG TERM GOAL #2   Title Pt will perform TUG shuttle activity with rollator and min guard, at least 3 sets without break, to demonstrate improved transfer efficiency and safety.    Time 12    Period Weeks    Status New      PT LONG TERM GOAL #3   Title Pt will improve TUG score to less than or equal to 30 seconds for decreased fall risk/improved mobility in the home.    Time 12    Status New      PT LONG TERM GOAL #4   Title Pt/husband will verbalize understanding of fall prevention in home environment.    Time 12    Period Weeks    Status New      PT LONG TERM GOAL #5   Title Pt will improve gait  velocity to at least 1.1 ft/sec for improved gait efficiency and safety  in the home.    Time 12    Period Weeks    Status New                 Plan - 12/14/19 1228    Clinical Impression Statement Pt able to tolerate addition of 2# weights to BLEs with several sets of seated exercises.  Pt needs more assist today keeping rollator close with gait.  She will continue to benefit from skilled PT towards goals.    Personal Factors and Comorbidities Comorbidity 3+;Other    Comorbidities See problem list; recent diagnosis of PSP    Examination-Activity Limitations Locomotion Level;Transfers;Dressing;Stairs;Stand;Toileting    Examination-Participation Restrictions Community Activity;Shop    Stability/Clinical Decision Making Evolving/Moderate complexity    Rehab Potential Good    PT Frequency 2x / week    PT Duration 12 weeks   including eval week   PT Treatment/Interventions ADLs/Self Care Home Management;Gait training;Functional mobility training;Therapeutic activities;Therapeutic exercise;Balance training;Neuromuscular re-education;Patient/family education    PT Next Visit Plan [employer] reauth next week.  Seated and standing exercises (use of weights as able); heel raises/hip strategy work in standing; try standing with head turns/nods for balance    Consulted and Agree with Plan of Care Patient;Family member/caregiver    Family Member Consulted Husband           Patient will benefit from skilled therapeutic intervention in order to improve the following deficits and impairments:  Abnormal gait, Difficulty walking, Decreased safety awareness, Decreased endurance, Decreased activity tolerance, Decreased balance, Decreased mobility, Decreased strength, Postural dysfunction  Visit Diagnosis: Muscle weakness (generalized)  Other abnormalities of gait and mobility  Unsteadiness on feet     Problem List Patient Active Problem List   Diagnosis Date Noted  . Progressive supranuclear  palsy (HCC) 10/12/2019  . Encephalomalacia on imaging study 10/12/2019  . Fluency disorder associated with underlying disease 02/08/2018  . Cognitive and neurobehavioral dysfunction 02/08/2018  . 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 Arthritis of carpometacarpal [facility]) joint of left thumb 01/24/2018  . Ankle fracture, left 09/03/2016  . Hand dysfunction 03/11/2016  . Symptomatic varicose veins, bilateral 03/11/2016  . Numbness of left thumb 03/11/2016  . Elevated BP 07/27/2014  . Bunion of left foot 07/26/2014  . Abnormality of gait 07/26/2014  . Cough 11/10/2013  . Post-nasal drainage 11/10/2013  . Abnormal urine odor 11/10/2013  . Visit for preventive health examination 07/28/2013  . Right anterior knee pain 07/22/2012  . Medicare annual wellness visit, subsequent 07/25/2011  . ANXIETY, SITUATIONAL 09/25/2009  . ARTHRITIS 09/25/2009  . Vitamin D deficiency 09/27/2008  . Hyperlipidemia 09/17/2008  . Elevated blood pressure reading without diagnosis of hypertension 09/17/2008  . COLONIC POLYPS, HX OF 09/17/2008  . PERSONAL HISTORY DISEASES SKIN&SUBCUT TISSUE 09/17/2008    Jameil Whitmoyer W. 12/14/2019, 12:31 PM  Gean Maidens., PT   [facility] [address] [place], [place], [address] Phone: [phone]   Fax:  [phone]  Name: Pamela Alvarez MRN: [id number] Date of Birth: 17-Jan-1941

## 2019-12-14 NOTE — Therapy (Signed)
[facility] [address] [place], [place], [address] Phone: [phone]   Fax:  [phone]  Occupational Therapy Treatment  Patient Details  Name: Pamela Alvarez MRN: [id number] Date of Birth: 07-Feb-1941 Referring Provider (OT): Dr. Vickey Huger   Encounter Date: 12/14/2019   OT End of Session - 12/14/19 1527    Visit Number 5    Number of Visits 25    Date for OT Re-Evaluation 01/22/20    Authorization Type [employer]    Authorization Time Period 16 visitsfor [employer] , 90 day cert    Authorization - Visit Number 5    Authorization - Number of Visits 10    OT Start Time 1106    OT Stop Time 1145    OT Time Calculation (min) 39 min    Activity Tolerance Patient tolerated treatment well    Behavior During Therapy Flat affect           Past Medical History:  Diagnosis Date   Cyst 07-2008   Cyst on back x2 that were drained   H/O echocardiogram 02-2000   Hx of colonic polyps    Hyperlipidemia    Hypertension    Varicose veins of both lower extremities     Past Surgical History:  Procedure Laterality Date   POLYPECTOMY     Colon   TONSILLECTOMY     TUBAL LIGATION      There were no vitals filed for this visit.   Subjective Assessment - 12/14/19 1530    Subjective  Ok    Pertinent History PSP    Patient Stated Goals to maintain independence, increase left hand use    Currently in Pain? No/denies                   Treatment: Pegboard activity with large pegs and LUE, oven mitt use over RUE to promote LUE use, mod facilitation/ v.c Cane exercises AA/ROM shoulder flexion , abduction. Discussed challenges with tasks at home, pt's huband to bring in food next session for practice eating. Stringing large pegs with bilateral UE's mod-max difficulty/facilitation/  v.c                OT Short Term Goals - 12/14/19 1529      OT SHORT TERM GOAL #1   Title I with HEP.    Time  4    Period Weeks    Status On-going    Target Date 11/23/19      OT SHORT TERM GOAL #2   Title Pt will use LUE to assist with ADLs/ functional activities at least 10% of the time with min v.c    Baseline uses 5% or less    Time 4    Period Weeks    Status On-going      OT SHORT TERM GOAL #3   Title Pt's caregiver will verbalize understanding of adapted strategies to maximize safety and I with ADLs/ IADLs .    Time 4    Period Weeks    Status On-going             OT Long Term Goals - 12/14/19 1529      OT LONG TERM GOAL #1   Title Pt will perform teeth brushing with improved throughness and only min v.c from husband.    Baseline Pt is not performing thoroughly enough    Time 12    Period Weeks    Status On-going  OT LONG TERM GOAL #2   Title Pt will demonstrate improved ease with self feeding as evidenced by decreasing PPT#2 to 55 secs or less.    Time 12    Period Weeks    Status On-going      OT LONG TERM GOAL #3   Title Pt will demonstrate improved LUE functional use for ADLs as eveidenced by increasing box/ blocks score to 8 blocks for LUE.    Time 12    Period Weeks    Status On-going      OT LONG TERM GOAL #4   Title Pt will demonstrate ability to retrieve a leightweight item at 90 shoulder flexion with -15 elbow extension.    Baseline LUE 8 shoulder flexion , -20 elbow extension.    Time 12    Period Weeks    Status On-going                 Plan - 12/14/19 1528    Clinical Impression Statement Pt continues to require significant v.c and facilitation for use of LUE.    OT Occupational Profile and History Detailed Assessment- Review of Records and additional review of physical, cognitive, psychosocial history related to current functional performance    Occupational performance deficits (Please refer to evaluation for details): ADL's;IADL's;Leisure;Social Participation    Body Structure / Function / Physical Skills  ADL;Balance;Mobility;Strength;UE functional use;Tone;FMC;Flexibility;Coordination;Gait;Vision;Sensation;IADL;Dexterity;Decreased knowledge of use of DME;GMC;Decreased knowledge of precautions    Cognitive Skills Attention;Problem Solve;Safety Awareness;Sequencing;Thought;Understand    Rehab Potential Fair    Clinical Decision Making Several treatment options, min-mod task modification necessary    Comorbidities Affecting Occupational Performance: May have comorbidities impacting occupational performance    Modification or Assistance to Complete Evaluation  Min-Moderate modification of tasks or assist with assess necessary to complete eval    OT Frequency 2x / week    OT Duration 12 weeks   plus eval, anticipate d/c after 6-8 weeks dep on pt progress   OT Treatment/Interventions Self-care/ADL training;Therapeutic exercise;Splinting;Manual Therapy;Neuromuscular education;Ultrasound;Aquatic Therapy;Therapeutic activities;Paraffin;DME and/or AE instruction;Cognitive remediation/compensation;Visual/perceptual remediation/compensation;Gait Training;Fluidtherapy;Electrical Stimulation;Moist Heat;Contrast Bath;Passive range of motion;Patient/family education    Plan feeding strategies, pt's husband to bring in food items    Consulted and Agree with Plan of Care Patient;Family member/caregiver           Patient will benefit from skilled therapeutic intervention in order to improve the following deficits and impairments:   Body Structure / Function / Physical Skills: ADL, Balance, Mobility, Strength, UE functional use, Tone, FMC, Flexibility, Coordination, Gait, Vision, Sensation, IADL, Dexterity, Decreased knowledge of use of DME, GMC, Decreased knowledge of precautions Cognitive Skills: Attention, Problem Solve, Safety Awareness, Sequencing, Thought, Understand     Visit Diagnosis: Muscle weakness (generalized)  Other abnormalities of gait and mobility  Other symptoms and signs involving the  nervous system  Frontal lobe and executive function deficit  Attention and concentration deficit  Other lack of coordination    Problem List Patient Active Problem List   Diagnosis Date Noted   Progressive supranuclear palsy (HCC) 10/12/2019   Encephalomalacia on imaging study 10/12/2019   Fluency disorder associated with underlying disease 02/08/2018   Cognitive and neurobehavioral dysfunction 02/08/2018   Supranuclear ocular palsy 02/08/2018   Arthritis of carpometacarpal (CMC) joint of left thumb 01/24/2018   Ankle fracture, left 09/03/2016   Hand dysfunction 03/11/2016   Symptomatic varicose veins, bilateral 03/11/2016   Numbness of left thumb 03/11/2016   Elevated BP 07/27/2014   Bunion of left foot 07/26/2014  Abnormality of gait 07/26/2014   Cough 11/10/2013   Post-nasal drainage 11/10/2013   Abnormal urine odor 11/10/2013   Visit for preventive health examination 07/28/2013   Right anterior knee pain 07/22/2012   Medicare annual wellness visit, subsequent 07/25/2011   ANXIETY, SITUATIONAL 09/25/2009   ARTHRITIS 09/25/2009   Vitamin D deficiency 09/27/2008   Hyperlipidemia 09/17/2008   Elevated blood pressure reading without diagnosis of hypertension 09/17/2008   COLONIC POLYPS, HX OF 09/17/2008   PERSONAL HISTORY DISEASES SKIN&SUBCUT TISSUE 09/17/2008    Pamela Alvarez 12/14/2019, 3:31 PM  [facility] [address] [place], [place], [address] Phone: [phone]   Fax:  [phone]  Name: Pamela Alvarez MRN: [id number] Date of Birth: [birth year]

## 2019-12-14 NOTE — Therapy (Signed)
[facility] [address] [place], [place], [address] Phone: [phone]   Fax:  [phone]  Speech Language Pathology Treatment  Patient Details  Name: Pamela Alvarez MRN: [id number] Date of Birth: March 18, 1940 Referring Provider (SLP): Dohmeier, Porfirio Mylar, MD   Encounter Date: 12/14/2019   End of Session - 12/14/19 1150    Visit Number 8    Number of Visits 17    Date for SLP Re-Evaluation 02/05/20    SLP Start Time 1148    SLP Stop Time  1230    SLP Time Calculation (min) 42 min    Activity Tolerance Patient tolerated treatment well           Past Medical History:  Diagnosis Date  . Cyst 07-2008   Cyst on back x2 that were drained  . H/O echocardiogram 02-2000  . Hx of colonic polyps   . Hyperlipidemia   . Hypertension   . Varicose veins of both lower extremities     Past Surgical History:  Procedure Laterality Date  . POLYPECTOMY     Colon  . TONSILLECTOMY    . TUBAL LIGATION      There were no vitals filed for this visit.   Subjective Assessment - 12/14/19 1254    Subjective Spouse used device with patient in communication breakdown; she requested a different drink: soda.    Patient is accompained by: Family member    Currently in Pain? No/denies                 ADULT SLP TREATMENT - 12/14/19 1255      General Information   Behavior/Cognition Alert;Cooperative      Treatment Provided   Treatment provided Cognitive-Linquistic      Pain Assessment   Pain Assessment No/denies pain      Cognitive-Linquistic Treatment   Treatment focused on Cognition;Aphasia;Patient/family/caregiver education    Skilled Treatment Used device at home during communication breakdown (see "S"). Spouse has been compiling a list of other topics and opportunities to use the device. Encouraged him to send this list to consultant so they can be added to pt's permanent device prior to shipping. Education today re:  using the device to communicate personal information, such as pt's schooling and hobby of writing. Demonstrated use of speech-to-text to expedite icon personalization. Patient smiled when SLP added icons for her to discuss writing poetry and her book. Trial completed today; pt and spouse in agreement to hold therapy for 2 weeks to await shipment of permanent device.       Assessment / Recommendations / Plan   Plan Other (Comment)   hold for 2 weeks for permanent device shipment     Progression Toward Goals   Progression toward goals Progressing toward goals            SLP Education - 12/14/19 1300    Education Details add to list of possible questions/responses when family members visiting    Person(s) Educated Patient;Spouse    Methods Explanation    Comprehension Verbalized understanding            SLP Short Term Goals - 12/14/19 1303      SLP SHORT TERM GOAL #1   Title pt will relay simple biographical information using AAC if necessary with mod cues x 3 visits.    Baseline 12/14/19    Time 2    Period Weeks    Status On-going      SLP SHORT TERM GOAL #2  Title pt will respond to simple questions re: preferences, using AAC if necessary, with mod cues x3 visits.    Baseline 12/12/19 12/14/19    Time 2    Period Weeks    Status On-going      SLP SHORT TERM GOAL #3   Title Pt will ask an appropriate question in simple communication exchange, using AAC if necessary, in 2 sessions.    Baseline 12/14/19    Time 2    Period Weeks    Status On-going            SLP Long Term Goals - 12/14/19 1303      SLP LONG TERM GOAL #1   Title pt/spouse will report improved communication abilities than prior to beginning ST    Time 5    Period Weeks    Status On-going      SLP LONG TERM GOAL #2   Title Pt/spouse will report using AAC/supportive conversation strategies to resolve communication breakdown outside ST x 3 visits.    Baseline 12/14/19    Time 5    Period Weeks     Status On-going      SLP LONG TERM GOAL #3   Title Pt will make simple requests, using AAC if necessary, outside ST room x 3 sessions    Baseline 12/14/19    Time 5   renewed 11/24   Period Weeks    Status On-going   omitted "over three sessions"     SLP LONG TERM GOAL #4   Time --   renewed 11/24     SLP LONG TERM GOAL #5   Time --   renewed 11/24           Plan - 12/14/19 1301    Clinical Impression Statement Pamela Alvarez presents with aphasia and cognitive deficits secondary to PSP, recently diagnosed. Pt/husband report decline in communication abilities over the past 8 months since completing previous ST course and are interested in pursuing AAC to enhance/supplement communication. Patient and spouse have been using [employer] Touchtalk at home to aid Pamela Alvarez in making choices and to communicate over the phone with relatives. They wish to pursue permanent device. Will hold ST for 2 weeks to allow for paperwork processing and shipment of permanent communication device. I recommend skilled ST to train pt and spouse in compensations, strategies to maximize communication abilities and educate on appropriate cognitive-linguistic activities to maintain function.    Speech Therapy Frequency 2x / week    Duration --   8 weeks or 17 visits   Treatment/Interventions SLP instruction and feedback;Compensatory strategies;Patient/family education;Multimodal communcation approach;Cueing hierarchy;Language facilitation;Functional tasks;Oral motor exercises;Internal/external aids;Environmental controls;Cognitive reorganization   AAC   Potential to Achieve Goals Fair    Potential Considerations Severity of impairments           Patient will benefit from skilled therapeutic intervention in order to improve the following deficits and impairments:   Aphasia  Cognitive communication deficit    Problem List Patient Active Problem List   Diagnosis Date Noted  . Progressive supranuclear palsy (HCC)  10/12/2019  . Encephalomalacia on imaging study 10/12/2019  . Fluency disorder associated with underlying disease 02/08/2018  . Cognitive and neurobehavioral dysfunction 02/08/2018  . 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 Arthritis of carpometacarpal [facility]) joint of left thumb 01/24/2018  . Ankle fracture, left 09/03/2016  . Hand dysfunction 03/11/2016  . Symptomatic varicose veins, bilateral 03/11/2016  . Numbness of left thumb 03/11/2016  . Elevated BP 07/27/2014  .  Bunion of left foot 07/26/2014  . Abnormality of gait 07/26/2014  . Cough 11/10/2013  . Post-nasal drainage 11/10/2013  . Abnormal urine odor 11/10/2013  . Visit for preventive health examination 07/28/2013  . Right anterior knee pain 07/22/2012  . Medicare annual wellness visit, subsequent 07/25/2011  . ANXIETY, SITUATIONAL 09/25/2009  . ARTHRITIS 09/25/2009  . Vitamin D deficiency 09/27/2008  . Hyperlipidemia 09/17/2008  . Elevated blood pressure reading without diagnosis of hypertension 09/17/2008  . COLONIC POLYPS, HX OF 09/17/2008  . PERSONAL HISTORY DISEASES SKIN&SUBCUT TISSUE 09/17/2008   Rondel Baton, MS, CCC-SLP Speech-Language Pathologist  Arlana Lindau 12/14/2019, 1:04 PM  [facility] [address] [place], [place], [address] Phone: [phone]   Fax:  [phone]   Name: Pamela Alvarez MRN: [id number] Date of Birth: [birth year]

## 2019-12-19 ENCOUNTER — Encounter: Payer: Medicare PPO | Admitting: Speech Pathology

## 2019-12-19 ENCOUNTER — Encounter: Payer: Medicare PPO | Admitting: Occupational Therapy

## 2019-12-19 ENCOUNTER — Ambulatory Visit: Payer: Medicare PPO | Admitting: Physical Therapy

## 2019-12-21 ENCOUNTER — Other Ambulatory Visit: Payer: Self-pay

## 2019-12-21 ENCOUNTER — Ambulatory Visit: Payer: Medicare PPO | Admitting: Occupational Therapy

## 2019-12-21 ENCOUNTER — Ambulatory Visit: Payer: Medicare PPO | Admitting: Physical Therapy

## 2019-12-21 ENCOUNTER — Encounter: Payer: Self-pay | Admitting: Occupational Therapy

## 2019-12-21 ENCOUNTER — Encounter: Payer: Medicare PPO | Admitting: Speech Pathology

## 2019-12-21 DIAGNOSIS — R2689 Other abnormalities of gait and mobility: Secondary | ICD-10-CM

## 2019-12-21 DIAGNOSIS — R41840 Attention and concentration deficit: Secondary | ICD-10-CM | POA: Diagnosis not present

## 2019-12-21 DIAGNOSIS — R41844 Frontal lobe and executive function deficit: Secondary | ICD-10-CM

## 2019-12-21 DIAGNOSIS — R41842 Visuospatial deficit: Secondary | ICD-10-CM

## 2019-12-21 DIAGNOSIS — R29818 Other symptoms and signs involving the nervous system: Secondary | ICD-10-CM

## 2019-12-21 DIAGNOSIS — R278 Other lack of coordination: Secondary | ICD-10-CM

## 2019-12-21 DIAGNOSIS — M6281 Muscle weakness (generalized): Secondary | ICD-10-CM

## 2019-12-21 DIAGNOSIS — R2681 Unsteadiness on feet: Secondary | ICD-10-CM | POA: Diagnosis not present

## 2019-12-21 DIAGNOSIS — R4701 Aphasia: Secondary | ICD-10-CM | POA: Diagnosis not present

## 2019-12-21 NOTE — Therapy (Signed)
[facility] [address] [place], [place], [address] Phone: [phone]   Fax:  [phone]  Physical Therapy Treatment  Patient Details  Name: Pamela Alvarez MRN: [id number] Date of Birth: April 19, 1940 Referring Provider (PT): Dohmeier, Porfirio Mylar   Encounter Date: 12/21/2019   PT End of Session - 12/21/19 1934    Visit Number 12    Number of Visits 25    Date for PT Re-Evaluation 01/21/20    Authorization Type [employer] Medicare-submitted auth at completion of eval; new auth request for 10 additional visits requested after visit 12/21/2019    Authorization Time Period 10/23/2019-12/23/2019    Authorization - Visit Number 12    Authorization - Number of Visits 16    Progress Note Due on Visit 20    PT Start Time 1402    PT Stop Time 1445    PT Time Calculation (min) 43 min    Equipment Utilized During Treatment Gait belt    Activity Tolerance Patient tolerated treatment well   Pt appears more fatigued than usual, vitals WNL   Behavior During Therapy Flat affect;WFL for tasks assessed/performed           Past Medical History:  Diagnosis Date  . Cyst 07-2008   Cyst on back x2 that were drained  . H/O echocardiogram 02-2000  . Hx of colonic polyps   . Hyperlipidemia   . Hypertension   . Varicose veins of both lower extremities     Past Surgical History:  Procedure Laterality Date  . POLYPECTOMY     Colon  . TONSILLECTOMY    . TUBAL LIGATION      There were no vitals filed for this visit.   Subjective Assessment - 12/21/19 1413    Subjective Husband reports she's tired today.  No changes over the weekend.    Patient is accompained by: Family member   Husband-provides history   Patient Stated Goals To try to maintain as much as possible, try to prevent regression as much as possible.    Currently in Pain? No/denies                     Self Care:  Pt's husband reports pt is having some swelling in  LLE.  The private-pay person they have coming in for exercise has looked at it and is not concerned.  PT notes that LLE is larger than RLE, but is not warmer or redder or painful.  It does appear that pt has mild pitting edema in LLE.  Advised husband to continue to monitor (elevate and attempt ankle pumps), and contact MD with any further questions.        [facility] Adult PT Treatment/Exercise - 12/21/19 0001      Transfers   Transfers Sit to Stand;Stand to Sit    Sit to Stand 4: Min assist    Sit to Stand Details Verbal cues for sequencing;Verbal cues for technique;Tactile cues for placement;Tactile cues for initiation    Stand to Sit 4: Min assist    Stand to Sit Details (indicate cue type and reason) Tactile cues for sequencing;Tactile cues for weight shifting;Verbal cues for sequencing;Verbal cues for precautions/safety;Verbal cues for technique    Stand to Sit Details PT provides cues to fully turn and sit.    Number of Reps 10 reps   mat surface, then 5 reps from chair     Ambulation/Gait   Ambulation/Gait Yes    Ambulation/Gait Assistance 4: Min assist  Ambulation Distance (Feet) 115 Feet   80 ft then 60 ft.   Assistive device Rollator    Gait Pattern Step-through pattern;Step-to pattern;Decreased step length - right;Decreased step length - left;Decreased stride length;Decreased dorsiflexion - right;Decreased dorsiflexion - left;Trunk flexed;Poor foot clearance - left;Poor foot clearance - right;Trunk rotated posteriorly on left    Ambulation Surface Level;Indoor    Gait Comments Husband reports that HHA at home is not going well for longer distances and he will be transitioning to using rollator.  Discussed safety techniques with transfers, turning to have pt sit for rest breaks, and safety with gait wiht use of gait belt.  Pt's husband demo being at pt's L side providing assistance as instructed.          Husband overall does a good, safe job assisting pt with locked rollator  sitting brake.  Cues for making sure he conitnues to cue her for full turn to sit for improved safety with transfers.          PT Short Term Goals - 11/30/19 1340      PT SHORT TERM GOAL #1   Title Pt will perform at least 4 minutes of continuous gait activity with rollator, min assist, for improved endurance for gait in the home.  TARGET 12/29/2019    Time 8    Period Weeks    Status Revised      PT SHORT TERM GOAL #2   Title Pt will perform 10 reps of sit<>stand with minimal assistance for improved transfer efficiency and functional strength.    Baseline performs min assist 11/28/2019, 5 reps    Time 8    Period Weeks    Status Revised      PT SHORT TERM GOAL #3   Title Pt will improve TUG score to less than or equal to 45 seconds for decreased fall risk.    Baseline 53.69 sec at eval; 53.91 11/28/2019    Time 8    Period Weeks    Status On-going             PT Long Term Goals - 10/25/19 0735      PT LONG TERM GOAL #1   Title Pt/husband will verbalize and demonstrate understanding of final HEP to maintain functional gains made in therapy.  TARGET 01/12/2020    Time 12    Period Weeks    Status New      PT LONG TERM GOAL #2   Title Pt will perform TUG shuttle activity with rollator and min guard, at least 3 sets without break, to demonstrate improved transfer efficiency and safety.    Time 12    Period Weeks    Status New      PT LONG TERM GOAL #3   Title Pt will improve TUG score to less than or equal to 30 seconds for decreased fall risk/improved mobility in the home.    Time 12    Status New      PT LONG TERM GOAL #4   Title Pt/husband will verbalize understanding of fall prevention in home environment.    Time 12    Period Weeks    Status New      PT LONG TERM GOAL #5   Title Pt will improve gait velocity to at least 1.1 ft/sec for improved gait efficiency and safety in the home.    Time 12    Period Weeks    Status New  Plan - 12/21/19 1935    Clinical Impression Statement Focus of today's session included training for husband with gait safety with rolling walker, including safe turns to sit and indications when pt is tired to take standing or seated breaks.  Additional work on transfers for Chiropractor.    Personal Factors and Comorbidities Comorbidity 3+;Other    Comorbidities See problem list; recent diagnosis of PSP    Examination-Activity Limitations Locomotion Level;Transfers;Dressing;Stairs;Stand;Toileting    Examination-Participation Restrictions Community Activity;Shop    Stability/Clinical Decision Making Evolving/Moderate complexity    Rehab Potential Good    PT Frequency 2x / week    PT Duration 12 weeks   including eval week   PT Treatment/Interventions ADLs/Self Care Home Management;Gait training;Functional mobility training;Therapeutic activities;Therapeutic exercise;Balance training;Neuromuscular re-education;Patient/family education    PT Next Visit Plan [employer] reauth requested.  Check STGs and ask about appts.  Ask how gait with rollator went at home.  Seated and standing exercises (use of weights as able); heel raises/hip strategy work in standing; try standing with head turns/nods for balance    Consulted and Agree with Plan of Care Patient;Family member/caregiver    Family Member Consulted Husband           Patient will benefit from skilled therapeutic intervention in order to improve the following deficits and impairments:  Abnormal gait, Difficulty walking, Decreased safety awareness, Decreased endurance, Decreased activity tolerance, Decreased balance, Decreased mobility, Decreased strength, Postural dysfunction  Visit Diagnosis: Other abnormalities of gait and mobility  Muscle weakness (generalized)     Problem List Patient Active Problem List   Diagnosis Date Noted  . Progressive supranuclear palsy (HCC) 10/12/2019  . Encephalomalacia on imaging  study 10/12/2019  . Fluency disorder associated with underlying disease 02/08/2018  . Cognitive and neurobehavioral dysfunction 02/08/2018  . 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 Arthritis of carpometacarpal [facility]) joint of left thumb 01/24/2018  . Ankle fracture, left 09/03/2016  . Hand dysfunction 03/11/2016  . Symptomatic varicose veins, bilateral 03/11/2016  . Numbness of left thumb 03/11/2016  . Elevated BP 07/27/2014  . Bunion of left foot 07/26/2014  . Abnormality of gait 07/26/2014  . Cough 11/10/2013  . Post-nasal drainage 11/10/2013  . Abnormal urine odor 11/10/2013  . Visit for preventive health examination 07/28/2013  . Right anterior knee pain 07/22/2012  . Medicare annual wellness visit, subsequent 07/25/2011  . ANXIETY, SITUATIONAL 09/25/2009  . ARTHRITIS 09/25/2009  . Vitamin D deficiency 09/27/2008  . Hyperlipidemia 09/17/2008  . Elevated blood pressure reading without diagnosis of hypertension 09/17/2008  . COLONIC POLYPS, HX OF 09/17/2008  . PERSONAL HISTORY DISEASES SKIN&SUBCUT TISSUE 09/17/2008    Desma Wilkowski W. 12/21/2019, 7:39 PM  Gean Maidens., PT   [facility] [address] [place], [place], [address] Phone: [phone]   Fax:  [phone]  Name: Nolah Krenzer MRN: [id number] Date of Birth: 06/11/1940

## 2019-12-21 NOTE — Therapy (Signed)
[facility] [address] [place], [place], [address] Phone: [phone]   Fax:  [phone]  Occupational Therapy Treatment  Patient Details  Name: Pamela Alvarez MRN: [id number] Date of Birth: 1940-04-30 Referring Provider (OT): Dr. Vickey Huger   Encounter Date: 12/21/2019   OT End of Session - 12/21/19 1646    Visit Number 6    Number of Visits 25    Date for OT Re-Evaluation 01/22/20    Authorization Type [employer]    Authorization Time Period 16 visitsfor [employer] , 90 day cert    Authorization - Visit Number 6    Authorization - Number of Visits 10    OT Start Time 1450    OT Stop Time 1530    OT Time Calculation (min) 40 min    Activity Tolerance Patient tolerated treatment well    Behavior During Therapy Flat affect           Past Medical History:  Diagnosis Date   Cyst 07-2008   Cyst on back x2 that were drained   H/O echocardiogram 02-2000   Hx of colonic polyps    Hyperlipidemia    Hypertension    Varicose veins of both lower extremities     Past Surgical History:  Procedure Laterality Date   POLYPECTOMY     Colon   TONSILLECTOMY     TUBAL LIGATION      There were no vitals filed for this visit.    Pt denies pain today    Treatment: Self feeding activity with pt scooping tapioca pudding, min v.c to pause between bites and for smaller bite size to prevent choking AA/ROM shoulder flexion closed chain x 20 with dowel, min v.c A/ROM elbow flexion/ extension x 15 reps min v.c Placing large to small pegs in semi circle with RUE, mod v.c and min facilitation for LUE functional use. Improved performance today.                   OT Short Term Goals - 12/14/19 1529      OT SHORT TERM GOAL #1   Title I with HEP.    Time 4    Period Weeks    Status On-going    Target Date 11/23/19      OT SHORT TERM GOAL #2   Title Pt will use LUE to assist with ADLs/ functional  activities at least 10% of the time with min v.c    Baseline uses 5% or less    Time 4    Period Weeks    Status On-going      OT SHORT TERM GOAL #3   Title Pt's caregiver will verbalize understanding of adapted strategies to maximize safety and I with ADLs/ IADLs .    Time 4    Period Weeks    Status On-going             OT Long Term Goals - 12/14/19 1529      OT LONG TERM GOAL #1   Title Pt will perform teeth brushing with improved throughness and only min v.c from husband.    Baseline Pt is not performing thoroughly enough    Time 12    Period Weeks    Status On-going      OT LONG TERM GOAL #2   Title Pt will demonstrate improved ease with self feeding as evidenced by decreasing PPT#2 to 55 secs or less.    Time 12  Period Weeks    Status On-going      OT LONG TERM GOAL #3   Title Pt will demonstrate improved LUE functional use for ADLs as eveidenced by increasing box/ blocks score to 8 blocks for LUE.    Time 12    Period Weeks    Status On-going      OT LONG TERM GOAL #4   Title Pt will demonstrate ability to retrieve a leightweight item at 90 shoulder flexion with -15 elbow extension.    Baseline LUE 8 shoulder flexion , -20 elbow extension.    Time 12    Period Weeks    Status On-going                 Plan - 12/21/19 1647    Clinical Impression Statement Pt demonstrated improved LUE functional use today, and good ability to feed herself using RUe with min v.c to pause between bites.    OT Occupational Profile and History Detailed Assessment- Review of Records and additional review of physical, cognitive, psychosocial history related to current functional performance    Occupational performance deficits (Please refer to evaluation for details): ADL's;IADL's;Leisure;Social Participation    Body Structure / Function / Physical Skills ADL;Balance;Mobility;Strength;UE functional  use;Tone;FMC;Flexibility;Coordination;Gait;Vision;Sensation;IADL;Dexterity;Decreased knowledge of use of DME;GMC;Decreased knowledge of precautions    Cognitive Skills Attention;Problem Solve;Safety Awareness;Sequencing;Thought;Understand    Rehab Potential Fair    Clinical Decision Making Several treatment options, min-mod task modification necessary    Comorbidities Affecting Occupational Performance: May have comorbidities impacting occupational performance    Modification or Assistance to Complete Evaluation  Min-Moderate modification of tasks or assist with assess necessary to complete eval    OT Frequency 2x / week    OT Duration 12 weeks   plus eval, anticipate d/c after 6-8 weeks dep on pt progress   OT Treatment/Interventions Self-care/ADL training;Therapeutic exercise;Splinting;Manual Therapy;Neuromuscular education;Ultrasound;Aquatic Therapy;Therapeutic activities;Paraffin;DME and/or AE instruction;Cognitive remediation/compensation;Visual/perceptual remediation/compensation;Gait Training;Fluidtherapy;Electrical Stimulation;Moist Heat;Contrast Bath;Passive range of motion;Patient/family education    Plan continue to address LUe functional use, feeding strategies prn    Consulted and Agree with Plan of Care Patient;Family member/caregiver           Patient will benefit from skilled therapeutic intervention in order to improve the following deficits and impairments:   Body Structure / Function / Physical Skills: ADL, Balance, Mobility, Strength, UE functional use, Tone, FMC, Flexibility, Coordination, Gait, Vision, Sensation, IADL, Dexterity, Decreased knowledge of use of DME, GMC, Decreased knowledge of precautions Cognitive Skills: Attention, Problem Solve, Safety Awareness, Sequencing, Thought, Understand     Visit Diagnosis: Muscle weakness (generalized)  Other symptoms and signs involving the nervous system  Frontal lobe and executive function deficit  Attention and  concentration deficit  Other lack of coordination  Visuospatial deficit    Problem List Patient Active Problem List   Diagnosis Date Noted   Progressive supranuclear palsy (HCC) 10/12/2019   Encephalomalacia on imaging study 10/12/2019   Fluency disorder associated with underlying disease 02/08/2018   Cognitive and neurobehavioral dysfunction 02/08/2018   Supranuclear ocular palsy 02/08/2018   Arthritis of carpometacarpal (CMC) joint of left thumb 01/24/2018   Ankle fracture, left 09/03/2016   Hand dysfunction 03/11/2016   Symptomatic varicose veins, bilateral 03/11/2016   Numbness of left thumb 03/11/2016   Elevated BP 07/27/2014   Bunion of left foot 07/26/2014   Abnormality of gait 07/26/2014   Cough 11/10/2013   Post-nasal drainage 11/10/2013   Abnormal urine odor 11/10/2013   Visit for preventive health examination 07/28/2013  Right anterior knee pain 07/22/2012   Medicare annual wellness visit, subsequent 07/25/2011   ANXIETY, SITUATIONAL 09/25/2009   ARTHRITIS 09/25/2009   Vitamin D deficiency 09/27/2008   Hyperlipidemia 09/17/2008   Elevated blood pressure reading without diagnosis of hypertension 09/17/2008   COLONIC POLYPS, HX OF 09/17/2008   PERSONAL HISTORY DISEASES SKIN&SUBCUT TISSUE 09/17/2008    Swade Shonka 12/21/2019, 4:48 PM  [facility] [address] [place], [place], [address] Phone: [phone]   Fax:  [phone]  Name: Myda Detwiler MRN: [id number] Date of Birth: [birth year]

## 2019-12-22 ENCOUNTER — Ambulatory Visit: Payer: Medicare PPO | Admitting: Internal Medicine

## 2019-12-22 ENCOUNTER — Encounter: Payer: Self-pay | Admitting: Internal Medicine

## 2019-12-22 ENCOUNTER — Telehealth: Payer: Self-pay | Admitting: Internal Medicine

## 2019-12-22 VITALS — BP 140/80 | HR 89 | Temp 98.6°F | Wt 150.0 lb

## 2019-12-22 DIAGNOSIS — Z79899 Other long term (current) drug therapy: Secondary | ICD-10-CM

## 2019-12-22 DIAGNOSIS — I10 Essential (primary) hypertension: Secondary | ICD-10-CM

## 2019-12-22 DIAGNOSIS — E785 Hyperlipidemia, unspecified: Secondary | ICD-10-CM | POA: Diagnosis not present

## 2019-12-22 DIAGNOSIS — M7989 Other specified soft tissue disorders: Secondary | ICD-10-CM | POA: Diagnosis not present

## 2019-12-22 DIAGNOSIS — G231 Progressive supranuclear ophthalmoplegia [Steele-Richardson-Olszewski]: Secondary | ICD-10-CM | POA: Diagnosis not present

## 2019-12-22 NOTE — Progress Notes (Signed)
Chief Complaint  Patient presents with  . Leg Swelling    Left lower leg swelling since Monday, husband has been measuriing past 3 days does not seem to be getting bigger.    HPI: Pamela Alvarez 79 y.o. come in for actue sda visit  With spouse   In wc alert  Co 1 week of left leg swelling   without trauma fall   Tends to have legs down most day but not had this before .   Doesn't seem to be uncomfortable with this . No cp sob  resp sx fever  getting PT in home 3 x per week to maintain endurance   BP had been ok still on atorva for hld Last visit with me 10 2020   Has aphasia and impaired mobility in past for le  Poor gait and  Hx of le fracture   Left  fibula Dr Mickie Bail  Dx SNPand  Rehab  ROS: See pertinent positives and negatives per HPI. No bleeding resp sx  Past Medical History:  Diagnosis Date  . Cyst 07-2008   Cyst on back x2 that were drained  . H/O echocardiogram 02-2000  . Hx of colonic polyps   . Hyperlipidemia   . Hypertension   . Varicose veins of both lower extremities     Family History  Problem Relation Age of Onset  . Hypertension Mother   . Parkinsonism Mother   . Angina Father   . Heart disease Father   . Colon cancer Father   . Lung cancer Father   . Skin cancer Father   . Cancer Father        Heart  . Breast cancer Sister        over 27  . Diabetes Sister   . Heart disease Brother 39       triple bypass surgery  . Macular degeneration Sister   . Diabetes Sister        prediabetic  . Hearing loss Sister        hearing problems  . Vasculitis Sister     Social History   Socioeconomic History  . Marital status: Married    Spouse name: Pamela Alvarez  . Number of children: 2  . Years of education: Not on file  . Highest education level: Doctorate  Occupational History  . Occupation: retired    Comment: Social research officer, government [name]  Tobacco Use  . Smoking status: Never Smoker  . Smokeless tobacco: Never Used  Substance and Sexual Activity  . Alcohol  use: Yes    Alcohol/week: 7.0 standard drinks    Types: 7 Glasses of wine per week    Comment: one a day  . Drug use: Not Currently  . Sexual activity: Not on file  Other Topics Concern  . Not on file  Social History Narrative   Regular exercise-yes   Moved back to [place] from [place] of 2 cat   G3 P2    Exercises walking regu;arly    Retired  Social research officer, government   Drinks one cup of coffee  A day. In addition to walking QD she and her husband take a pilates class on Thursdays. She lives with her husband in a 2 story house, though the master bedroom in on the first level.       Social Determinants of Health   Financial Resource Strain:   . Difficulty of Paying Living Expenses: Not on file  Food Insecurity:   . Worried About  Running Out of Food in the Last Year: Not on file  . Ran Out of Food in the Last Year: Not on file  Transportation Needs:   . Lack of Transportation (Medical): Not on file  . Lack of Transportation (Non-Medical): Not on file  Physical Activity:   . Days of Exercise per Week: Not on file  . Minutes of Exercise per Session: Not on file  Stress:   . Feeling of Stress : Not on file  Social Connections:   . Frequency of Communication with Friends and Family: Not on file  . Frequency of Social Gatherings with Friends and Family: Not on file  . Attends Religious Services: Not on file  . Active Member of Clubs or Organizations: Not on file  . Attends Banker Meetings: Not on file  . Marital Status: Not on file    Outpatient Medications Prior to Visit  Medication Sig Dispense Refill  . atorvastatin (LIPITOR) 10 MG tablet TAKE 1 TABLET BY MOUTH EVERY DAY 90 tablet 2  . carbidopa-levodopa (SINEMET IR) 25-100 MG tablet TAKE 1 TABLET BY MOUTH THREE TIMES A DAY 90 tablet 2  . Cholecalciferol (VITAMIN D3 PO) Take 1 tablet by mouth daily.    Marland Kitchen losartan (COZAAR) 25 MG tablet Take 25 mg by mouth daily.    . vitamin B-12 (CYANOCOBALAMIN) 1000 MCG tablet  Take 1,000 mcg by mouth 3 (three) times a week.     No facility-administered medications prior to visit.     EXAM:  BP 140/80   Pulse 89   Temp 98.6 F (37 C)   Wt 150 lb (68 kg)   SpO2 96%   BMI 27.44 kg/m   Body mass index is 27.44 kg/m.  GENERAL: vitals reviewed and listed above, alert, cooperative  Not verbbal x simple responses , appears well hydrated and in no acute distress  In WC  HEENT: atraumatic, conjunctiva  clear, no obvious abnormalities on inspection of external nose and ears OP : masked  NECK: no obvious masses on inspection palpation  LUNGS: clear to auscultation bilaterally, no wheezes, rales or rhonchi, good air movement CV: HRRR, no clubbing cyanosis or  LLE  2-3 + edema distal without  Redness some warmth  No ulcers and non tender    MS: moves all extremities without noticeable focal  Abnormality no ob bony tender   Skin no burising petechia Lab Results  Component Value Date   WBC 7.6 12/23/2018   HGB 14.8 12/23/2018   HCT 44.3 12/23/2018   PLT 284.0 12/23/2018   GLUCOSE 93 12/23/2018   CHOL 254 (H) 12/23/2018   TRIG 73.0 12/23/2018   HDL 68.90 12/23/2018   LDLDIRECT 191.8 07/22/2012   LDLCALC 171 (H) 12/23/2018   ALT 16 12/23/2018   AST 19 12/23/2018   NA 141 12/23/2018   K 4.4 12/23/2018   CL 107 12/23/2018   CREATININE 0.98 12/23/2018   BUN 15 12/23/2018   CO2 24 12/23/2018   TSH 2.57 12/23/2018   HGBA1C 5.7 12/23/2018   BP Readings from Last 3 Encounters:  12/22/19 140/80  10/12/19 138/86  05/24/19 (!) 141/88    ASSESSMENT AND PLAN:  Discussed the following assessment and plan:  Left leg swelling - hsa fenous insuff but this is new assymmetrical check [place] for dvt obstuction - Plan: Hepatic function panel, BASIC METABOLIC PANEL WITH GFR, CBC with Differential/Platelet, TSH, Lipid panel, VAS [place] LOWER EXTREMITY VENOUS (DVT), Lipid panel, TSH, CBC with Differential/Platelet, BASIC METABOLIC PANEL  WITH GFR, Hepatic function  panel  Medication management - Plan: Hepatic function panel, BASIC METABOLIC PANEL WITH GFR, CBC with Differential/Platelet, TSH, Lipid panel, Lipid panel, TSH, CBC with Differential/Platelet, BASIC METABOLIC PANEL WITH GFR, Hepatic function panel  Hyperlipidemia, unspecified hyperlipidemia type - Plan: Hepatic function panel, BASIC METABOLIC PANEL WITH GFR, CBC with Differential/Platelet, TSH, Lipid panel, Lipid panel, TSH, CBC with Differential/Platelet, BASIC METABOLIC PANEL WITH GFR, Hepatic function panel  Essential hypertension - Plan: Hepatic function panel, BASIC METABOLIC PANEL WITH GFR, CBC with Differential/Platelet, TSH, Lipid panel, Lipid panel, TSH, CBC with Differential/Platelet, BASIC METABOLIC PANEL WITH GFR, Hepatic function panel  Progressive supranuclear palsy (HCC) Due for labs monitoring  Evaluate leg for dvt elevate  Unable to get doppler late  on Friday afternoon  Offices close  If getting worse over weekend then got to ED otherwise  Plan for next week   Plan fu depending  Husband alluded to Long term care  And home health referrals   -Patient advised to return or notify health care team  if  new concerns arise.  Patient Instructions  Checking for  Blood clots      Elevate legs as possible.  Will notify you  of labs when available.   Then go from there.  Fu depending on results     Neta Mends. Eliot Popper M.D. [phone]   Neuro  Assessment   Neurodegenerative disease process - as we ruled out vascular injuries.  - Gliosis confirms the trauma is most likely the cause of scars unclear if these caused the dementia , but they apply to the hemilateral findings. - see MRI brain.   Progressive - oculomotor palsy, now not able to look up or downwards but also not moving eyes on the horizontal plane.  increased muscle tone, rigidity and propulsive fall risk now wheelchair bound .  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   Left thumb  clumsiness. Now clearly increased rigidity in the left wrist, biceps. Hyperreflexia over both patellae.

## 2019-12-22 NOTE — Telephone Encounter (Signed)
No longer needed

## 2019-12-22 NOTE — Patient Instructions (Addendum)
Checking for  Blood clots      Elevate legs as possible.  Will notify you  of labs when available.   Then go from there.  Fu depending on results

## 2019-12-23 LAB — BASIC METABOLIC PANEL WITH GFR
BUN/Creatinine Ratio: 18 (calc) (ref 6–22)
BUN: 20 mg/dL (ref 7–25)
CO2: 25 mmol/L (ref 20–32)
Calcium: 9.7 mg/dL (ref 8.6–10.4)
Chloride: 105 mmol/L (ref 98–110)
Creat: 1.13 mg/dL — ABNORMAL HIGH (ref 0.60–0.88)
GFR, Est African American: 53 mL/min/{1.73_m2} — ABNORMAL LOW (ref >=60)
GFR, Est Non African American: 46 mL/min/{1.73_m2} — ABNORMAL LOW (ref >=60)
Glucose, Bld: 106 mg/dL — ABNORMAL HIGH (ref 65–99)
Potassium: 4.3 mmol/L (ref 3.5–5.3)
Sodium: 140 mmol/L (ref 135–146)

## 2019-12-23 LAB — HEPATIC FUNCTION PANEL
AG Ratio: 1.2 (calc) (ref 1.0–2.5)
ALT: 9 U/L (ref 6–29)
AST: 18 U/L (ref 10–35)
Albumin: 3.8 g/dL (ref 3.6–5.1)
Alkaline phosphatase (APISO): 104 U/L (ref 37–153)
Bilirubin, Direct: 0.1 mg/dL (ref 0.0–0.2)
Globulin: 3.1 g/dL (calc) (ref 1.9–3.7)
Indirect Bilirubin: 0.3 mg/dL (calc) (ref 0.2–1.2)
Total Bilirubin: 0.4 mg/dL (ref 0.2–1.2)
Total Protein: 6.9 g/dL (ref 6.1–8.1)

## 2019-12-23 LAB — CBC WITH DIFFERENTIAL/PLATELET
Absolute Monocytes: 631 cells/uL (ref 200–950)
Basophils Absolute: 61 cells/uL (ref 0–200)
Basophils Relative: 0.8 %
Eosinophils Absolute: 350 cells/uL (ref 15–500)
Eosinophils Relative: 4.6 %
HCT: 42.8 % (ref 35.0–45.0)
Hemoglobin: 14.3 g/dL (ref 11.7–15.5)
Lymphs Abs: 2493 cells/uL (ref 850–3900)
MCH: 30 pg (ref 27.0–33.0)
MCHC: 33.4 g/dL (ref 32.0–36.0)
MCV: 89.7 fL (ref 80.0–100.0)
MPV: 9.5 fL (ref 7.5–12.5)
Monocytes Relative: 8.3 %
Neutro Abs: 4066 cells/uL (ref 1500–7800)
Neutrophils Relative %: 53.5 %
Platelets: 300 10*3/uL (ref 140–400)
RBC: 4.77 10*6/uL (ref 3.80–5.10)
RDW: 12.8 % (ref 11.0–15.0)
Total Lymphocyte: 32.8 %
WBC: 7.6 10*3/uL (ref 3.8–10.8)

## 2019-12-23 LAB — LIPID PANEL
Cholesterol: 202 mg/dL — ABNORMAL HIGH (ref <200)
HDL: 69 mg/dL (ref >=50)
LDL Cholesterol (Calc): 111 mg/dL (calc) — ABNORMAL HIGH
Non-HDL Cholesterol (Calc): 133 mg/dL (calc) — ABNORMAL HIGH (ref <130)
Total CHOL/HDL Ratio: 2.9 (calc) (ref <5.0)
Triglycerides: 110 mg/dL (ref <150)

## 2019-12-23 LAB — TSH: TSH: 2.1 mIU/L (ref 0.40–4.50)

## 2019-12-25 ENCOUNTER — Ambulatory Visit (HOSPITAL_COMMUNITY)
Admission: RE | Admit: 2019-12-25 | Discharge: 2019-12-25 | Disposition: A | Discharge status: Home / Self Care | Payer: Medicare PPO | Source: Ambulatory Visit | Attending: Internal Medicine | Admitting: Internal Medicine

## 2019-12-25 ENCOUNTER — Encounter: Payer: Self-pay | Admitting: Internal Medicine

## 2019-12-25 ENCOUNTER — Other Ambulatory Visit: Payer: Self-pay

## 2019-12-25 ENCOUNTER — Ambulatory Visit: Payer: Medicare PPO | Admitting: Internal Medicine

## 2019-12-25 ENCOUNTER — Telehealth: Payer: Self-pay | Admitting: *Deleted

## 2019-12-25 VITALS — BP 128/70 | HR 67 | Temp 97.9°F

## 2019-12-25 DIAGNOSIS — M7989 Other specified soft tissue disorders: Secondary | ICD-10-CM | POA: Diagnosis not present

## 2019-12-25 DIAGNOSIS — I82412 Acute embolism and thrombosis of left femoral vein: Secondary | ICD-10-CM

## 2019-12-25 DIAGNOSIS — Z79899 Other long term (current) drug therapy: Secondary | ICD-10-CM | POA: Diagnosis not present

## 2019-12-25 MED ORDER — APIXABAN 5 MG PO TABS: ORAL_TABLET | ORAL | 1 refills | Status: DC

## 2019-12-25 NOTE — Patient Instructions (Signed)
Elevated leg as possible   Extreme caution with falls   Risk of bleeding  . If any bleeding  Contact team asap or go to ED.  ROV in about 3 weeks    Deep Vein Thrombosis  Deep vein thrombosis (DVT) is a condition in which a blood clot forms in a deep vein, such as a lower leg, thigh, or arm vein. A clot is blood that has thickened into a gel or solid. This condition is dangerous. It can lead to serious and even life-threatening complications if the clot travels to the lungs and causes a blockage (pulmonary embolism). It can also damage veins in the leg. This can result in leg pain, swelling, discoloration, and sores (post-thrombotic syndrome). What are the causes? This condition may be caused by:  A slowdown of blood flow.  Damage to a vein.  A condition that causes blood to clot more easily, such as an inherited clotting disorder. What increases the risk? The following factors may make you more likely to develop this condition:  Being overweight.  Being older, especially over age 35.  Sitting or lying down for more than four hours.  Being in the hospital.  Lack of physical activity (sedentary lifestyle).  Pregnancy, being in childbirth, or having recently given birth.  Taking medicines that contain estrogen, such as medicines to prevent pregnancy.  Smoking.  A history of any of the following: ? Blood clots or a blood clotting disease. ? Peripheral vascular disease. ? Inflammatory bowel disease. ? Cancer. ? Heart disease. ? Genetic conditions that affect how your blood clots, such as Factor V Leiden mutation. ? Neurological diseases that affect your legs (leg paresis). ? A recent injury, such as a car accident. ? Major or lengthy surgery. ? A central line placed inside a large vein. What are the signs or symptoms? Symptoms of this condition include:  Swelling, pain, or tenderness in an arm or leg.  Warmth, redness, or discoloration in an arm or leg. If the clot  is in your leg, symptoms may be more noticeable or worse when you stand or walk. Some people may not develop any symptoms. How is this diagnosed? This condition is diagnosed with:  A medical history and physical exam.  Tests, such as: ? Blood tests. These are done to check how well your blood clots. ? Ultrasound. This is done to check for clots. ? Venogram. For this test, contrast dye is injected into a vein and X-rays are taken to check for any clots. How is this treated? Treatment for this condition depends on:  The cause of your DVT.  Your risk for bleeding or developing more clots.  Any other medical conditions that you have. Treatment may include:  Taking a blood thinner (anticoagulant). This type of medicine prevents clots from forming. It may be taken by mouth, injected under the skin, or injected through an IV (catheter).  Injecting clot-dissolving medicines into the affected vein (catheter-directed thrombolysis).  Having surgery. Surgery may be done to: ? Remove the clot. ? Place a filter in a large vein to catch blood clots before they reach the lungs. Some treatments may be continued for up to six months. Follow these instructions at home: If you are taking blood thinners:  Take the medicine exactly as told by your health care provider. Some blood thinners need to be taken at the same time every day. Do not skip a dose.  Talk with your health care provider before you take any medicines that  contain aspirin or NSAIDs. These medicines increase your risk for dangerous bleeding.  Ask your health care provider about foods and drugs that could change the way the medicine works (may interact). Avoid those things if your health care provider tells you to do so.  Blood thinners can cause easy bruising and may make it difficult to stop bleeding. Because of this: ? Be very careful when using knives, scissors, or other sharp objects. ? Use an electric razor instead of a  blade. ? Avoid activities that could cause injury or bruising, and follow instructions about how to prevent falls.  Wear a medical alert bracelet or carry a card that lists what medicines you take. General instructions  Take over-the-counter and prescription medicines only as told by your health care provider.  Return to your normal activities as told by your health care provider. Ask your health care provider what activities are safe for you.  Wear compression stockings if recommended by your health care provider.  Keep all follow-up visits as told by your health care provider. This is important. How is this prevented? To lower your risk of developing this condition again:  For 30 or more minutes every day, do an activity that: ? Involves moving your arms and legs. ? Increases your heart rate.  When traveling for longer than four hours: ? Exercise your arms and legs every hour. ? Drink plenty of water. ? Avoid drinking alcohol.  Avoid sitting or lying for a long time without moving your legs.  If you have surgery or you are hospitalized, ask about ways to prevent blood clots. These may include taking frequent walks or using anticoagulants.  Stay at a healthy weight.  If you are a woman who is older than age 32, avoid unnecessary use of medicines that contain estrogen, such as some birth control pills.  Do not use any products that contain nicotine or tobacco, such as cigarettes and e-cigarettes. This is especially important if you take estrogen medicines. If you need help quitting, ask your health care provider. Contact a health care provider if:  You miss a dose of your blood thinner.  Your menstrual period is heavier than usual.  You have unusual bruising. Get help right away if:  You have: ? New or increased pain, swelling, or redness in an arm or leg. ? Numbness or tingling in an arm or leg. ? Shortness of breath. ? Chest pain. ? A rapid or irregular  heartbeat. ? A severe headache or confusion. ? A cut that will not stop bleeding.  There is blood in your vomit, stool, or urine.  You have a serious fall or accident, or you hit your head.  You feel light-headed or dizzy.  You cough up blood. These symptoms may represent a serious problem that is an emergency. Do not wait to see if the symptoms will go away. Get medical help right away. Call your local emergency services (911 in the [place] Do not drive yourself to the hospital. Summary  Deep vein thrombosis (DVT) is a condition in which a blood clot forms in a deep vein, such as a lower leg, thigh, or arm vein.  Symptoms can include swelling, warmth, pain, and redness in your leg or arm.  This condition may be treated with a blood thinner (anticoagulant medicine), medicine that is injected to dissolve blood clots,compression stockings, or surgery.  If you are prescribed blood thinners, take them exactly as told. This information is not intended to replace advice given  to you by your health care provider. Make sure you discuss any questions you have with your health care provider. Document Revised: 02/05/2017 Document Reviewed: 07/24/2016 Elsevier Patient Education  2020 [employer].

## 2019-12-25 NOTE — Progress Notes (Signed)
Venous duplex       has been completed. Preliminary results can be found under CV proc through chart review. Jeb Levering, BS, RDMS, RVT   Called results to Dr. Fabian Sharp and patient instructed to return to office today.

## 2019-12-25 NOTE — Telephone Encounter (Signed)
Received a call from [facility]. Patient has DVTs. Results communicated to PCP who advised for patient to return to office.Patient added on to schedule for 4:30 PM

## 2019-12-25 NOTE — Progress Notes (Signed)
Chief Complaint  Patient presents with  . DVT    HPI: Pamela Alvarez 79 y.o. come in for add on sda   With spouse after Doppler  shows acute dvt of femoral and  popliteal  Left   Swelling down a bit from weekend  Elevated   No  Bleeding other  Neg family hx  No inciters except  Legs down a good bit and mobility No falling but takes a lot of precautions  And no bleeding tendency . No other change over weekend  ROS: See pertinent positives and negatives per HPI.  Past Medical History:  Diagnosis Date  . Cyst 07-2008   Cyst on back x2 that were drained  . H/O echocardiogram 02-2000  . Hx of colonic polyps   . Hyperlipidemia   . Hypertension   . Varicose veins of both lower extremities     Family History  Problem Relation Age of Onset  . Hypertension Mother   . Parkinsonism Mother   . Angina Father   . Heart disease Father   . Colon cancer Father   . Lung cancer Father   . Skin cancer Father   . Cancer Father        Heart  . Breast cancer Sister        over 41  . Diabetes Sister   . Heart disease Brother 74       triple bypass surgery  . Macular degeneration Sister   . Diabetes Sister        prediabetic  . Hearing loss Sister        hearing problems  . Vasculitis Sister     Social History   Socioeconomic History  . Marital status: Married    Spouse name: [name]  . Number of children: 2  . Years of education: Not on file  . Highest education level: Doctorate  Occupational History  . Occupation: retired    Comment: Social research officer, government [name]  Tobacco Use  . Smoking status: Never Smoker  . Smokeless tobacco: Never Used  Substance and Sexual Activity  . Alcohol use: Yes    Alcohol/week: 7.0 standard drinks    Types: 7 Glasses of wine per week    Comment: one a day  . Drug use: Not Currently  . Sexual activity: Not on file  Other Topics Concern  . Not on file  Social History Narrative   Regular exercise-yes   Moved back to [place] from [place] of 2 cat    G3 P2    Exercises walking regu;arly    Retired  Social research officer, government   Drinks one cup of coffee  A day. In addition to walking QD she and her husband take a pilates class on Thursdays. She lives with her husband in a 2 story house, though the master bedroom in on the first level.       Social Determinants of Health   Financial Resource Strain:   . Difficulty of Paying Living Expenses: Not on file  Food Insecurity:   . Worried About Programme researcher, broadcasting/film/video in the Last Year: Not on file  . Ran Out of Food in the Last Year: Not on file  Transportation Needs:   . Lack of Transportation (Medical): Not on file  . Lack of Transportation (Non-Medical): Not on file  Physical Activity:   . Days of Exercise per Week: Not on file  . Minutes of Exercise per Session: Not on file  Stress:   .  Feeling of Stress : Not on file  Social Connections:   . Frequency of Communication with Friends and Family: Not on file  . Frequency of Social Gatherings with Friends and Family: Not on file  . Attends Religious Services: Not on file  . Active Member of Clubs or Organizations: Not on file  . Attends BankerClub or Organization Meetings: Not on file  . Marital Status: Not on file    Outpatient Medications Prior to Visit  Medication Sig Dispense Refill  . atorvastatin (LIPITOR) 10 MG tablet TAKE 1 TABLET BY MOUTH EVERY DAY 90 tablet 2  . carbidopa-levodopa (SINEMET IR) 25-100 MG tablet TAKE 1 TABLET BY MOUTH THREE TIMES A DAY 90 tablet 2  . Cholecalciferol (VITAMIN D3 PO) Take 1 tablet by mouth daily.    Marland Kitchen. losartan (COZAAR) 25 MG tablet Take 25 mg by mouth daily.    . vitamin B-12 (CYANOCOBALAMIN) 1000 MCG tablet Take 1,000 mcg by mouth 3 (three) times a week.     No facility-administered medications prior to visit.     EXAM:  BP 128/70 (BP Location: Left Arm, Patient Position: Sitting, Cuff Size: Normal)   Pulse 67   Temp 97.9 F (36.6 C) (Oral)   SpO2 98%   There is no height or weight on file to  calculate BMI.  GENERAL: vitals reviewed and listed above, alert, appears well hydrated and in no acute distress HEENT: atraumatic, conjunctiva  clear, no obvious abnormalities on inspection of external nose and ears OP : masked  NECK: no obvious masses on inspection palpation  LUNGS: nl respiration  As previous  MS: moves all extremities left leg 2+ edema  Slightly less tense than last week   Non tender  No warmth  PSYCH: pleasant and cooperative sparse language  Lab Results  Component Value Date   WBC 7.6 12/22/2019   HGB 14.3 12/22/2019   HCT 42.8 12/22/2019   PLT 300 12/22/2019   GLUCOSE 106 (H) 12/22/2019   CHOL 202 (H) 12/22/2019   TRIG 110 12/22/2019   HDL 69 12/22/2019   LDLDIRECT 191.8 07/22/2012   LDLCALC 111 (H) 12/22/2019   ALT 9 12/22/2019   AST 18 12/22/2019   NA 140 12/22/2019   K 4.3 12/22/2019   CL 105 12/22/2019   CREATININE 1.13 (H) 12/22/2019   BUN 20 12/22/2019   CO2 25 12/22/2019   TSH 2.10 12/22/2019   HGBA1C 5.7 12/23/2018   BP Readings from Last 3 Encounters:  12/25/19 128/70  12/22/19 140/80  10/12/19 138/86   Lab review  ASSESSMENT AND PLAN:  Discussed the following assessment and plan:  Acute deep vein thrombosis (DVT) of femoral vein of left lower extremity (HCC)  Medication management Sample of 14 eliquis given  And coupon and  For 30 days  No charge  72 written  Instructions given  And reviewed fall risk precaution to continue and seek care if bleeding   Hold on PT for now until follo wup  Suspect inactivity and legs down contributed to sx  -Patient advised to return or notify health care team  if  new concerns arise.  Patient Instructions  Elevated leg as possible   Extreme caution with falls   Risk of bleeding  . If any bleeding  Contact team asap or go to ED.  ROV in about 3 weeks    Deep Vein Thrombosis  Deep vein thrombosis (DVT) is a condition in which a blood clot forms in a deep vein, such as  a lower leg, thigh, or  arm vein. A clot is blood that has thickened into a gel or solid. This condition is dangerous. It can lead to serious and even life-threatening complications if the clot travels to the lungs and causes a blockage (pulmonary embolism). It can also damage veins in the leg. This can result in leg pain, swelling, discoloration, and sores (post-thrombotic syndrome). What are the causes? This condition may be caused by:  A slowdown of blood flow.  Damage to a vein.  A condition that causes blood to clot more easily, such as an inherited clotting disorder. What increases the risk? The following factors may make you more likely to develop this condition:  Being overweight.  Being older, especially over age 30.  Sitting or lying down for more than four hours.  Being in the hospital.  Lack of physical activity (sedentary lifestyle).  Pregnancy, being in childbirth, or having recently given birth.  Taking medicines that contain estrogen, such as medicines to prevent pregnancy.  Smoking.  A history of any of the following: ? Blood clots or a blood clotting disease. ? Peripheral vascular disease. ? Inflammatory bowel disease. ? Cancer. ? Heart disease. ? Genetic conditions that affect how your blood clots, such as Factor V Leiden mutation. ? Neurological diseases that affect your legs (leg paresis). ? A recent injury, such as a car accident. ? Major or lengthy surgery. ? A central line placed inside a large vein. What are the signs or symptoms? Symptoms of this condition include:  Swelling, pain, or tenderness in an arm or leg.  Warmth, redness, or discoloration in an arm or leg. If the clot is in your leg, symptoms may be more noticeable or worse when you stand or walk. Some people may not develop any symptoms. How is this diagnosed? This condition is diagnosed with:  A medical history and physical exam.  Tests, such as: ? Blood tests. These are done to check how well your  blood clots. ? Ultrasound. This is done to check for clots. ? Venogram. For this test, contrast dye is injected into a vein and X-rays are taken to check for any clots. How is this treated? Treatment for this condition depends on:  The cause of your DVT.  Your risk for bleeding or developing more clots.  Any other medical conditions that you have. Treatment may include:  Taking a blood thinner (anticoagulant). This type of medicine prevents clots from forming. It may be taken by mouth, injected under the skin, or injected through an IV (catheter).  Injecting clot-dissolving medicines into the affected vein (catheter-directed thrombolysis).  Having surgery. Surgery may be done to: ? Remove the clot. ? Place a filter in a large vein to catch blood clots before they reach the lungs. Some treatments may be continued for up to six months. Follow these instructions at home: If you are taking blood thinners:  Take the medicine exactly as told by your health care provider. Some blood thinners need to be taken at the same time every day. Do not skip a dose.  Talk with your health care provider before you take any medicines that contain aspirin or NSAIDs. These medicines increase your risk for dangerous bleeding.  Ask your health care provider about foods and drugs that could change the way the medicine works (may interact). Avoid those things if your health care provider tells you to do so.  Blood thinners can cause easy bruising and may make it difficult to stop  bleeding. Because of this: ? Be very careful when using knives, scissors, or other sharp objects. ? Use an electric razor instead of a blade. ? Avoid activities that could cause injury or bruising, and follow instructions about how to prevent falls.  Wear a medical alert bracelet or carry a card that lists what medicines you take. General instructions  Take over-the-counter and prescription medicines only as told by your health  care provider.  Return to your normal activities as told by your health care provider. Ask your health care provider what activities are safe for you.  Wear compression stockings if recommended by your health care provider.  Keep all follow-up visits as told by your health care provider. This is important. How is this prevented? To lower your risk of developing this condition again:  For 30 or more minutes every day, do an activity that: ? Involves moving your arms and legs. ? Increases your heart rate.  When traveling for longer than four hours: ? Exercise your arms and legs every hour. ? Drink plenty of water. ? Avoid drinking alcohol.  Avoid sitting or lying for a long time without moving your legs.  If you have surgery or you are hospitalized, ask about ways to prevent blood clots. These may include taking frequent walks or using anticoagulants.  Stay at a healthy weight.  If you are a woman who is older than age 93, avoid unnecessary use of medicines that contain estrogen, such as some birth control pills.  Do not use any products that contain nicotine or tobacco, such as cigarettes and e-cigarettes. This is especially important if you take estrogen medicines. If you need help quitting, ask your health care provider. Contact a health care provider if:  You miss a dose of your blood thinner.  Your menstrual period is heavier than usual.  You have unusual bruising. Get help right away if:  You have: ? New or increased pain, swelling, or redness in an arm or leg. ? Numbness or tingling in an arm or leg. ? Shortness of breath. ? Chest pain. ? A rapid or irregular heartbeat. ? A severe headache or confusion. ? A cut that will not stop bleeding.  There is blood in your vomit, stool, or urine.  You have a serious fall or accident, or you hit your head.  You feel light-headed or dizzy.  You cough up blood. These symptoms may represent a serious problem that is an  emergency. Do not wait to see if the symptoms will go away. Get medical help right away. Call your local emergency services (911 in the [place] Do not drive yourself to the hospital. Summary  Deep vein thrombosis (DVT) is a condition in which a blood clot forms in a deep vein, such as a lower leg, thigh, or arm vein.  Symptoms can include swelling, warmth, pain, and redness in your leg or arm.  This condition may be treated with a blood thinner (anticoagulant medicine), medicine that is injected to dissolve blood clots,compression stockings, or surgery.  If you are prescribed blood thinners, take them exactly as told. This information is not intended to replace advice given to you by your health care provider. Make sure you discuss any questions you have with your health care provider. Document Revised: 02/05/2017 Document Reviewed: 07/24/2016 Elsevier Patient Education  2020 [employer].     [facility] K. Donica Derouin M.D.

## 2019-12-26 ENCOUNTER — Encounter: Payer: Medicare PPO | Admitting: Speech Pathology

## 2019-12-26 ENCOUNTER — Encounter: Payer: Medicare PPO | Admitting: Occupational Therapy

## 2019-12-26 ENCOUNTER — Ambulatory Visit: Payer: Medicare PPO | Admitting: Physical Therapy

## 2019-12-27 DIAGNOSIS — L853 Xerosis cutis: Secondary | ICD-10-CM | POA: Diagnosis not present

## 2019-12-27 DIAGNOSIS — L821 Other seborrheic keratosis: Secondary | ICD-10-CM | POA: Diagnosis not present

## 2019-12-27 DIAGNOSIS — L723 Sebaceous cyst: Secondary | ICD-10-CM | POA: Diagnosis not present

## 2019-12-27 DIAGNOSIS — D1801 Hemangioma of skin and subcutaneous tissue: Secondary | ICD-10-CM | POA: Diagnosis not present

## 2019-12-27 DIAGNOSIS — L814 Other melanin hyperpigmentation: Secondary | ICD-10-CM | POA: Diagnosis not present

## 2019-12-28 ENCOUNTER — Encounter: Payer: Self-pay | Admitting: Occupational Therapy

## 2019-12-28 ENCOUNTER — Encounter: Payer: Medicare PPO | Admitting: Speech Pathology

## 2019-12-28 ENCOUNTER — Other Ambulatory Visit: Payer: Self-pay

## 2019-12-28 ENCOUNTER — Ambulatory Visit: Payer: Medicare PPO | Admitting: Occupational Therapy

## 2019-12-28 ENCOUNTER — Ambulatory Visit: Payer: Medicare PPO | Admitting: Physical Therapy

## 2019-12-28 VITALS — BP 135/79

## 2019-12-28 DIAGNOSIS — R4701 Aphasia: Secondary | ICD-10-CM | POA: Diagnosis not present

## 2019-12-28 DIAGNOSIS — R278 Other lack of coordination: Secondary | ICD-10-CM | POA: Diagnosis not present

## 2019-12-28 DIAGNOSIS — M6281 Muscle weakness (generalized): Secondary | ICD-10-CM | POA: Diagnosis not present

## 2019-12-28 DIAGNOSIS — R41844 Frontal lobe and executive function deficit: Secondary | ICD-10-CM

## 2019-12-28 DIAGNOSIS — R29818 Other symptoms and signs involving the nervous system: Secondary | ICD-10-CM

## 2019-12-28 DIAGNOSIS — R41840 Attention and concentration deficit: Secondary | ICD-10-CM

## 2019-12-28 DIAGNOSIS — R2689 Other abnormalities of gait and mobility: Secondary | ICD-10-CM | POA: Diagnosis not present

## 2019-12-28 DIAGNOSIS — R2681 Unsteadiness on feet: Secondary | ICD-10-CM | POA: Diagnosis not present

## 2019-12-28 DIAGNOSIS — R41842 Visuospatial deficit: Secondary | ICD-10-CM | POA: Diagnosis not present

## 2019-12-28 NOTE — Therapy (Signed)
[facility] [address] [place], [place], [address] Phone: [phone]   Fax:  [phone]  Occupational Therapy Treatment  Patient Details  Name: Pamela Alvarez MRN: [id number] Date of Birth: 08-Jul-1940 Referring Provider (OT): Dr. Vickey Huger   Encounter Date: 12/28/2019   OT End of Session - 12/28/19 1129    Visit Number 7    Number of Visits 25    Date for OT Re-Evaluation 01/22/20    Authorization Type [employer]    Authorization Time Period 16 visitsfor [employer] , 90 day cert    Authorization - Visit Number 6    Authorization - Number of Visits 10    OT Start Time 1110    OT Stop Time 1146    OT Time Calculation (min) 36 min    Activity Tolerance Patient tolerated treatment well    Behavior During Therapy Flat affect           Past Medical History:  Diagnosis Date  . Cyst 07-2008   Cyst on back x2 that were drained  . H/O echocardiogram 02-2000  . Hx of colonic polyps   . Hyperlipidemia   . Hypertension   . Varicose veins of both lower extremities     Past Surgical History:  Procedure Laterality Date  . POLYPECTOMY     Colon  . TONSILLECTOMY    . TUBAL LIGATION      Vitals:   12/28/19 1118  BP: 135/79     Subjective Assessment - 12/28/19 1305    Subjective  Pt reports a headache    Pertinent History PSP    Patient Stated Goals to maintain independence, increase left hand use    Currently in Pain? Yes   unable to rate            Treatment: Cane exercises AA/ROM for shoulder flexion, chest press and abduction 10-20 reps each, mod facilitation.  Functional use of LUE, placing large pegs in pegboard and removing with RUE, supervision/ min facilitation/ v.c Functional grasp / release of graded clothespins to place and remove from vertical antennae, min-mod facilitation/ v.c for LUE use.                     OT Short Term Goals - 12/28/19 1307      OT SHORT TERM GOAL  #1   Title I with HEP.    Time 4    Period Weeks    Status Achieved   pt/ husband are aware of functional activities HEP.   Target Date 11/23/19      OT SHORT TERM GOAL #2   Title Pt will use LUE to assist with ADLs/ functional activities at least 10% of the time with min v.c    Baseline uses 5% or less    Time 4    Period Weeks    Status On-going      OT SHORT TERM GOAL #3   Title Pt's caregiver will verbalize understanding of adapted strategies to maximize safety and I with ADLs/ IADLs .    Time 4    Period Weeks    Status On-going             OT Long Term Goals - 12/28/19 1308      OT LONG TERM GOAL #1   Title Pt will perform teeth brushing with improved throughness and only min v.c from husband.    Baseline Pt is not performing thoroughly enough  Time 12    Period Weeks    Status On-going      OT LONG TERM GOAL #2   Title Pt will demonstrate improved ease with self feeding as evidenced by decreasing PPT#2 to 55 secs or less.    Time 12    Period Weeks    Status On-going      OT LONG TERM GOAL #3   Title Pt will demonstrate improved LUE functional use for ADLs as eveidenced by increasing box/ blocks score to 8 blocks for LUE.    Time 12    Period Weeks    Status On-going      OT LONG TERM GOAL #4   Title Pt will demonstrate ability to retrieve a leightweight item at 90 shoulder flexion with -15 elbow extension.    Baseline LUE 8 shoulder flexion , -20 elbow extension.    Time 12    Period Weeks    Status On-going                 Plan - 12/28/19 1306    Clinical Impression Statement Pt is progressing towards goals. Pt's husband reports that he purchased a scoop dish for pt following therapist recommendation and this is working well.  Pt has a LE blood clot therefore all activities were perfromed in seated for OT.    OT Occupational Profile and History Detailed Assessment- Review of Records and additional review of physical, cognitive, psychosocial  history related to current functional performance    Occupational performance deficits (Please refer to evaluation for details): ADL's;IADL's;Leisure;Social Participation    Body Structure / Function / Physical Skills ADL;Balance;Mobility;Strength;UE functional use;Tone;FMC;Flexibility;Coordination;Gait;Vision;Sensation;IADL;Dexterity;Decreased knowledge of use of DME;GMC;Decreased knowledge of precautions    Cognitive Skills Attention;Problem Solve;Safety Awareness;Sequencing;Thought;Understand    Rehab Potential Fair    Clinical Decision Making Several treatment options, min-mod task modification necessary    Comorbidities Affecting Occupational Performance: May have comorbidities impacting occupational performance    Modification or Assistance to Complete Evaluation  Min-Moderate modification of tasks or assist with assess necessary to complete eval    OT Frequency 2x / week    OT Duration 12 weeks   plus eval, anticipate d/c after 6-8 weeks dep on pt progress   OT Treatment/Interventions Self-care/ADL training;Therapeutic exercise;Splinting;Manual Therapy;Neuromuscular education;Ultrasound;Aquatic Therapy;Therapeutic activities;Paraffin;DME and/or AE instruction;Cognitive remediation/compensation;Visual/perceptual remediation/compensation;Gait Training;Fluidtherapy;Electrical Stimulation;Moist Heat;Contrast Bath;Passive range of motion;Patient/family education    Plan continue to address LUE functional use, feeding strategies prn    Consulted and Agree with Plan of Care Patient;Family member/caregiver           Patient will benefit from skilled therapeutic intervention in order to improve the following deficits and impairments:   Body Structure / Function / Physical Skills: ADL, Balance, Mobility, Strength, UE functional use, Tone, FMC, Flexibility, Coordination, Gait, Vision, Sensation, IADL, Dexterity, Decreased knowledge of use of DME, GMC, Decreased knowledge of precautions Cognitive  Skills: Attention, Problem Solve, Safety Awareness, Sequencing, Thought, Understand     Visit Diagnosis: Muscle weakness (generalized)  Other symptoms and signs involving the nervous system  Frontal lobe and executive function deficit  Attention and concentration deficit  Other lack of coordination    Problem List Patient Active Problem List   Diagnosis Date Noted  . Progressive supranuclear palsy (HCC) 10/12/2019  . Encephalomalacia on imaging study 10/12/2019  . Fluency disorder associated with underlying disease 02/08/2018  . Cognitive and neurobehavioral dysfunction 02/08/2018  . 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 Arthritis of carpometacarpal [facility]) joint of left thumb 01/24/2018  .  Ankle fracture, left 09/03/2016  . Hand dysfunction 03/11/2016  . Symptomatic varicose veins, bilateral 03/11/2016  . Numbness of left thumb 03/11/2016  . Elevated BP 07/27/2014  . Bunion of left foot 07/26/2014  . Abnormality of gait 07/26/2014  . Visit for preventive health examination 07/28/2013  . Right anterior knee pain 07/22/2012  . Medicare annual wellness visit, subsequent 07/25/2011  . ANXIETY, SITUATIONAL 09/25/2009  . ARTHRITIS 09/25/2009  . Vitamin D deficiency 09/27/2008  . Hyperlipidemia 09/17/2008  . Elevated blood pressure reading without diagnosis of hypertension 09/17/2008  . COLONIC POLYPS, HX OF 09/17/2008  . PERSONAL HISTORY DISEASES SKIN&SUBCUT TISSUE 09/17/2008    Allicia Culley 12/28/2019, 1:08 PM  [place] [facility] [address] [place], [place], [address] Phone: [phone]   Fax:  [phone]  Name: Pamela Alvarez MRN: [id number] Date of Birth: [birth year]

## 2020-01-01 ENCOUNTER — Telehealth: Payer: Self-pay | Admitting: *Deleted

## 2020-01-01 NOTE — Telephone Encounter (Signed)
Charles called regarding patient, Leonette Most states he would like to know what kind of exercises could the patient do. Please advise

## 2020-01-01 NOTE — Telephone Encounter (Signed)
No lower extremity work out but can do upper body  Until I see her again  But reg activity should be ok  depending on the swelling

## 2020-01-02 ENCOUNTER — Ambulatory Visit: Payer: Medicare PPO | Admitting: Occupational Therapy

## 2020-01-02 ENCOUNTER — Other Ambulatory Visit: Payer: Self-pay | Admitting: Internal Medicine

## 2020-01-02 ENCOUNTER — Ambulatory Visit: Payer: Medicare PPO | Admitting: Speech Pathology

## 2020-01-02 ENCOUNTER — Other Ambulatory Visit: Payer: Self-pay

## 2020-01-02 DIAGNOSIS — R278 Other lack of coordination: Secondary | ICD-10-CM | POA: Diagnosis not present

## 2020-01-02 DIAGNOSIS — R2689 Other abnormalities of gait and mobility: Secondary | ICD-10-CM | POA: Diagnosis not present

## 2020-01-02 DIAGNOSIS — R41842 Visuospatial deficit: Secondary | ICD-10-CM | POA: Diagnosis not present

## 2020-01-02 DIAGNOSIS — R29818 Other symptoms and signs involving the nervous system: Secondary | ICD-10-CM | POA: Diagnosis not present

## 2020-01-02 DIAGNOSIS — M6281 Muscle weakness (generalized): Secondary | ICD-10-CM | POA: Diagnosis not present

## 2020-01-02 DIAGNOSIS — R41840 Attention and concentration deficit: Secondary | ICD-10-CM | POA: Diagnosis not present

## 2020-01-02 DIAGNOSIS — R4701 Aphasia: Secondary | ICD-10-CM | POA: Diagnosis not present

## 2020-01-02 DIAGNOSIS — R2681 Unsteadiness on feet: Secondary | ICD-10-CM | POA: Diagnosis not present

## 2020-01-02 DIAGNOSIS — R41844 Frontal lobe and executive function deficit: Secondary | ICD-10-CM

## 2020-01-02 NOTE — Therapy (Signed)
[facility] [name] [place], [place], [address] Phone: [phone]   Fax:  [phone]  Occupational Therapy Treatment  Patient Details  Name: Pamela Alvarez MRN: [id number] Date of Birth: Oct 06, 1940 Referring Provider (OT): Dr. Vickey Huger   Encounter Date: 01/02/2020   OT End of Session - 01/02/20 1051    Visit Number 8    Number of Visits 25    Date for OT Re-Evaluation 01/22/20    Authorization Type [employer]    Authorization Time Period 16 visitsfor [employer] , 90 day cert    Authorization - Visit Number 8    Authorization - Number of Visits 10    Progress Note Due on Visit 10    OT Start Time 1020    OT Stop Time 1100    OT Time Calculation (min) 40 min    Activity Tolerance Patient tolerated treatment well    Behavior During Therapy Flat affect           Past Medical History:  Diagnosis Date  . Cyst 07-2008   Cyst on back x2 that were drained  . H/O echocardiogram 02-2000  . Hx of colonic polyps   . Hyperlipidemia   . Hypertension   . Varicose veins of both lower extremities     Past Surgical History:  Procedure Laterality Date  . POLYPECTOMY     Colon  . TONSILLECTOMY    . TUBAL LIGATION      There were no vitals filed for this visit.   Subjective Assessment - 01/02/20 1050    Subjective  NO  pain    Pertinent History PSP    Patient Stated Goals to maintain independence, increase left hand use    Currently in Pain? No/denies                      Treatment:Pt's husband reports that he is having a difficult morning with low frustration tolerance. therapist encouraged pt's husband to consider respite care. He is hoping to hire a caregiver next week. Pt's husband was encouraged to take a break and walk around the building while therapist worked with pt. Pt's husband took a break and he returned at end of session. Closed chain shoulder flexion, chest press and biceps curls  AA/ROM, then therapist providing gentle resistance with pt performing chest press, min v.c Functional reach and use of LUE to,place and remove large to small pegs into semi circle with LUE, min v.c and min-mod facilitation. Shelf liner to increase ease with picking up small pegs,  Increased time required.        OT Short Term Goals - 12/28/19 1307      OT SHORT TERM GOAL #1   Title I with HEP.    Time 4    Period Weeks    Status Achieved   pt/ husband are aware of functional activities HEP.   Target Date 11/23/19      OT SHORT TERM GOAL #2   Title Pt will use LUE to assist with ADLs/ functional activities at least 10% of the time with min v.c    Baseline uses 5% or less    Time 4    Period Weeks    Status On-going      OT SHORT TERM GOAL #3   Title Pt's caregiver will verbalize understanding of adapted strategies to maximize safety and I with ADLs/ IADLs .    Time 4    Period Weeks  Status On-going             OT Long Term Goals - 12/28/19 1308      OT LONG TERM GOAL #1   Title Pt will perform teeth brushing with improved throughness and only min v.c from husband.    Baseline Pt is not performing thoroughly enough    Time 12    Period Weeks    Status On-going      OT LONG TERM GOAL #2   Title Pt will demonstrate improved ease with self feeding as evidenced by decreasing PPT#2 to 55 secs or less.    Time 12    Period Weeks    Status On-going      OT LONG TERM GOAL #3   Title Pt will demonstrate improved LUE functional use for ADLs as eveidenced by increasing box/ blocks score to 8 blocks for LUE.    Time 12    Period Weeks    Status On-going      OT LONG TERM GOAL #4   Title Pt will demonstrate ability to retrieve a leightweight item at 90 shoulder flexion with -15 elbow extension.    Baseline LUE 8 shoulder flexion , -20 elbow extension.    Time 12    Period Weeks    Status On-going                  Patient will benefit from skilled  therapeutic intervention in order to improve the following deficits and impairments:           Visit Diagnosis: Muscle weakness (generalized)  Other symptoms and signs involving the nervous system  Frontal lobe and executive function deficit  Attention and concentration deficit  Other lack of coordination  Visuospatial deficit    Problem List Patient Active Problem List   Diagnosis Date Noted  . Progressive supranuclear palsy (HCC) 10/12/2019  . Encephalomalacia on imaging study 10/12/2019  . Fluency disorder associated with underlying disease 02/08/2018  . Cognitive and neurobehavioral dysfunction 02/08/2018  . 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 Arthritis of carpometacarpal [facility]) joint of left thumb 01/24/2018  . Ankle fracture, left 09/03/2016  . Hand dysfunction 03/11/2016  . Symptomatic varicose veins, bilateral 03/11/2016  . Numbness of left thumb 03/11/2016  . Elevated BP 07/27/2014  . Bunion of left foot 07/26/2014  . Abnormality of gait 07/26/2014  . Visit for preventive health examination 07/28/2013  . Right anterior knee pain 07/22/2012  . Medicare annual wellness visit, subsequent 07/25/2011  . ANXIETY, SITUATIONAL 09/25/2009  . ARTHRITIS 09/25/2009  . Vitamin D deficiency 09/27/2008  . Hyperlipidemia 09/17/2008  . Elevated blood pressure reading without diagnosis of hypertension 09/17/2008  . COLONIC POLYPS, HX OF 09/17/2008  . PERSONAL HISTORY DISEASES SKIN&SUBCUT TISSUE 09/17/2008    Pamela Alvarez 01/02/2020, 10:53 AM  Pamela Alvarez [facility] [address] [place], [place], [address] Phone: [phone]   Fax:  [phone]  Name: Pamela Alvarez MRN: [id number] Date of Birth: 1940/11/17

## 2020-01-02 NOTE — Telephone Encounter (Signed)
No answer at the number below for [facility] or the pts home number.

## 2020-01-03 NOTE — Telephone Encounter (Signed)
Called and spoke with Leonette Most and informed of message

## 2020-01-04 ENCOUNTER — Ambulatory Visit: Payer: Medicare PPO | Admitting: Speech Pathology

## 2020-01-04 ENCOUNTER — Other Ambulatory Visit: Payer: Self-pay

## 2020-01-04 ENCOUNTER — Ambulatory Visit: Payer: Medicare PPO | Admitting: Occupational Therapy

## 2020-01-04 DIAGNOSIS — M6281 Muscle weakness (generalized): Secondary | ICD-10-CM

## 2020-01-04 DIAGNOSIS — R278 Other lack of coordination: Secondary | ICD-10-CM

## 2020-01-04 DIAGNOSIS — R4701 Aphasia: Secondary | ICD-10-CM | POA: Diagnosis not present

## 2020-01-04 DIAGNOSIS — R41844 Frontal lobe and executive function deficit: Secondary | ICD-10-CM

## 2020-01-04 DIAGNOSIS — R41842 Visuospatial deficit: Secondary | ICD-10-CM | POA: Diagnosis not present

## 2020-01-04 DIAGNOSIS — R29818 Other symptoms and signs involving the nervous system: Secondary | ICD-10-CM | POA: Diagnosis not present

## 2020-01-04 DIAGNOSIS — R41840 Attention and concentration deficit: Secondary | ICD-10-CM

## 2020-01-04 DIAGNOSIS — R2689 Other abnormalities of gait and mobility: Secondary | ICD-10-CM | POA: Diagnosis not present

## 2020-01-04 DIAGNOSIS — R2681 Unsteadiness on feet: Secondary | ICD-10-CM | POA: Diagnosis not present

## 2020-01-04 NOTE — Therapy (Signed)
[facility] [address] [place], [place], [address] Phone: [phone]   Fax:  [phone]  Occupational Therapy Treatment  Patient Details  Name: Pamela Alvarez MRN: [id number] Date of Birth: Sep 13, 1940 Referring Provider (OT): Dr. Vickey Huger   Encounter Date: 01/04/2020   OT End of Session - 01/04/20 1047    Visit Number 9    Number of Visits 25    Date for OT Re-Evaluation 01/22/20    Authorization Type [employer]    Authorization Time Period 16 visitsfor [employer] , 90 day cert    Authorization - Visit Number 9    Authorization - Number of Visits 10    Progress Note Due on Visit 10    OT Start Time 1020    OT Stop Time 1100    OT Time Calculation (min) 40 min           Past Medical History:  Diagnosis Date  . Cyst 07-2008   Cyst on back x2 that were drained  . H/O echocardiogram 02-2000  . Hx of colonic polyps   . Hyperlipidemia   . Hypertension   . Varicose veins of both lower extremities     Past Surgical History:  Procedure Laterality Date  . POLYPECTOMY     Colon  . TONSILLECTOMY    . TUBAL LIGATION      There were no vitals filed for this visit.   Subjective Assessment - 01/04/20 1428    Subjective  Denies pain    Pertinent History PSP    Patient Stated Goals to maintain independence, increase left hand use    Currently in Pain? No/denies                    Treatment:Closed chain shoulder flexion, chest press and biceps curls AA/ROM, then using boomwhackers to drum on table top and overhead min v.c use of LUE to ,place medium  pegs into semi circle with LUE,  min v.c and min-mod facilitation/ v.c Pt demonstrates improved in hand manipulation  Pt used tabletop bike for reciprocal A/ROM  And conditioning after setup with min v.c            OT Short Term Goals - 01/04/20 1048      OT SHORT TERM GOAL #1   Title I with HEP.    Time 4    Period Weeks    Status  Achieved   pt/ husband are aware of functional activities HEP.   Target Date 11/23/19      OT SHORT TERM GOAL #2   Title Pt will use LUE to assist with ADLs/ functional activities at least 10% of the time with min v.c    Baseline uses 5% or less    Time 4    Period Weeks    Status On-going   currently5%     OT SHORT TERM GOAL #3   Title Pt's caregiver will verbalize understanding of adapted strategies to maximize safety and I with ADLs/ IADLs .    Time 4    Period Weeks    Status On-going             OT Long Term Goals - 01/04/20 1049      OT LONG TERM GOAL #1   Title Pt will perform teeth brushing with improved throughness and only min v.c from husband.    Baseline Pt is not performing thoroughly enough    Time 12    Period  Weeks    Status On-going      OT LONG TERM GOAL #2   Title Pt will demonstrate improved ease with self feeding as evidenced by decreasing PPT#2 to 55 secs or less.    Time 12    Period Weeks    Status On-going      OT LONG TERM GOAL #3   Title Pt will demonstrate improved LUE functional use for ADLs as eveidenced by increasing box/ blocks score to 8 blocks for LUE.    Time 12    Period Weeks    Status On-going      OT LONG TERM GOAL #4   Title Pt will demonstrate ability to retrieve a leightweight item at 90 shoulder flexion with -15 elbow extension.    Baseline LUE 8 shoulder flexion , -20 elbow extension.    Time 12    Period Weeks    Status On-going                  Patient will benefit from skilled therapeutic intervention in order to improve the following deficits and impairments:           Visit Diagnosis: Muscle weakness (generalized)  Other symptoms and signs involving the nervous system  Frontal lobe and executive function deficit  Attention and concentration deficit  Other lack of coordination    Problem List Patient Active Problem List   Diagnosis Date Noted  . Progressive supranuclear palsy (HCC)  10/12/2019  . Encephalomalacia on imaging study 10/12/2019  . Fluency disorder associated with underlying disease 02/08/2018  . Cognitive and neurobehavioral dysfunction 02/08/2018  . 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 Arthritis of carpometacarpal [facility]) joint of left thumb 01/24/2018  . Ankle fracture, left 09/03/2016  . Hand dysfunction 03/11/2016  . Symptomatic varicose veins, bilateral 03/11/2016  . Numbness of left thumb 03/11/2016  . Elevated BP 07/27/2014  . Bunion of left foot 07/26/2014  . Abnormality of gait 07/26/2014  . Visit for preventive health examination 07/28/2013  . Right anterior knee pain 07/22/2012  . Medicare annual wellness visit, subsequent 07/25/2011  . ANXIETY, SITUATIONAL 09/25/2009  . ARTHRITIS 09/25/2009  . Vitamin D deficiency 09/27/2008  . Hyperlipidemia 09/17/2008  . Elevated blood pressure reading without diagnosis of hypertension 09/17/2008  . COLONIC POLYPS, HX OF 09/17/2008  . PERSONAL HISTORY DISEASES SKIN&SUBCUT TISSUE 09/17/2008    Pamela Alvarez 01/04/2020, 2:29 PM  [facility] [address] [place], [place], [address] Phone: [phone]   Fax:  [phone]  Name: Pamela Alvarez MRN: [id number] Date of Birth: Oct 13, 1940

## 2020-01-09 ENCOUNTER — Ambulatory Visit: Payer: Medicare PPO | Attending: Neurology | Admitting: Occupational Therapy

## 2020-01-09 ENCOUNTER — Other Ambulatory Visit: Payer: Self-pay

## 2020-01-09 ENCOUNTER — Ambulatory Visit: Payer: Medicare PPO | Admitting: Speech Pathology

## 2020-01-09 DIAGNOSIS — R29818 Other symptoms and signs involving the nervous system: Secondary | ICD-10-CM | POA: Insufficient documentation

## 2020-01-09 DIAGNOSIS — R41840 Attention and concentration deficit: Secondary | ICD-10-CM | POA: Diagnosis not present

## 2020-01-09 DIAGNOSIS — R2681 Unsteadiness on feet: Secondary | ICD-10-CM | POA: Insufficient documentation

## 2020-01-09 DIAGNOSIS — M6281 Muscle weakness (generalized): Secondary | ICD-10-CM | POA: Diagnosis not present

## 2020-01-09 DIAGNOSIS — R41842 Visuospatial deficit: Secondary | ICD-10-CM | POA: Diagnosis not present

## 2020-01-09 DIAGNOSIS — R2689 Other abnormalities of gait and mobility: Secondary | ICD-10-CM | POA: Insufficient documentation

## 2020-01-09 DIAGNOSIS — R41841 Cognitive communication deficit: Secondary | ICD-10-CM | POA: Diagnosis present

## 2020-01-09 DIAGNOSIS — R41844 Frontal lobe and executive function deficit: Secondary | ICD-10-CM | POA: Insufficient documentation

## 2020-01-09 DIAGNOSIS — R4701 Aphasia: Secondary | ICD-10-CM

## 2020-01-09 DIAGNOSIS — R278 Other lack of coordination: Secondary | ICD-10-CM | POA: Diagnosis not present

## 2020-01-09 NOTE — Therapy (Signed)
[facility] [address] [place], [place], [address] Phone: [phone]   Fax:  [phone]  Occupational Therapy Treatment  Patient Details  Name: Pamela Alvarez MRN: [id number] Date of Birth: June 05, 1940 Referring Provider (OT): Dr. Vickey Huger   Encounter Date: 01/09/2020   OT End of Session - 01/09/20 1143    Visit Number 10    Number of Visits 25    Date for OT Re-Evaluation 01/22/20    Authorization Type [employer]    Authorization Time Period 16 visitsfor [employer] , 90 day cert    Authorization - Visit Number 10    Authorization - Number of Visits 10    Progress Note Due on Visit 10           Past Medical History:  Diagnosis Date  . Cyst 07-2008   Cyst on back x2 that were drained  . H/O echocardiogram 02-2000  . Hx of colonic polyps   . Hyperlipidemia   . Hypertension   . Varicose veins of both lower extremities     Past Surgical History:  Procedure Laterality Date  . POLYPECTOMY     Colon  . TONSILLECTOMY    . TUBAL LIGATION      There were no vitals filed for this visit.                Treatment: arm bike x 5 mins level 1 for conditioning and reciprocal movement. Min v.c and facilitation. Cane exercises for shoulder flexion, chest press and shoulder abduction, min tactile cues and mod v.c Functional reaching and fine motor coordination activity to place large to small pegs into pegboard with LUE , min-mod facilitation/ v.c, increased time and v.c. for in hand manipulation.         OT Education - 01/09/20 1142    Education Details yellow putty HEP    Person(s) Educated Patient;Spouse    Methods Explanation;Demonstration;Verbal cues;Handout    Comprehension Verbalized understanding;Returned demonstration;Verbal cues required            OT Short Term Goals - 01/04/20 1048      OT SHORT TERM GOAL #1   Title I with HEP.    Time 4    Period Weeks    Status Achieved    pt/ husband are aware of functional activities HEP.   Target Date 11/23/19      OT SHORT TERM GOAL #2   Title Pt will use LUE to assist with ADLs/ functional activities at least 10% of the time with min v.c    Baseline uses 5% or less    Time 4    Period Weeks    Status On-going   currently5%     OT SHORT TERM GOAL #3   Title Pt's caregiver will verbalize understanding of adapted strategies to maximize safety and I with ADLs/ IADLs .    Time 4    Period Weeks    Status On-going             OT Long Term Goals - 01/04/20 1049      OT LONG TERM GOAL #1   Title Pt will perform teeth brushing with improved throughness and only min v.c from husband.    Baseline Pt is not performing thoroughly enough    Time 12    Period Weeks    Status On-going      OT LONG TERM GOAL #2   Title Pt will demonstrate improved ease with self feeding  as evidenced by decreasing PPT#2 to 55 secs or less.    Time 12    Period Weeks    Status On-going      OT LONG TERM GOAL #3   Title Pt will demonstrate improved LUE functional use for ADLs as eveidenced by increasing box/ blocks score to 8 blocks for LUE.    Time 12    Period Weeks    Status On-going      OT LONG TERM GOAL #4   Title Pt will demonstrate ability to retrieve a leightweight item at 90 shoulder flexion with -15 elbow extension.    Baseline LUE 8 shoulder flexion , -20 elbow extension.    Time 12    Period Weeks    Status On-going                 Plan - 01/09/20 1203    Clinical Impression Statement For the reporting period of 10/23/19-01/09/20 pt is progressing towards goals slowly limited by significant cognitive deficits and left inattention. Pt can benefit from continued skilled occupational therapy to work towards unmet goals and to reinforce HEP as pt's husband is hiring a caregiver to assist her at home.    OT Occupational Profile and History Detailed Assessment- Review of Records and additional review of physical,  cognitive, psychosocial history related to current functional performance    Occupational performance deficits (Please refer to evaluation for details): ADL's;IADL's;Leisure;Social Participation    Body Structure / Function / Physical Skills ADL;Balance;Mobility;Strength;UE functional use;Tone;FMC;Flexibility;Coordination;Gait;Vision;Sensation;IADL;Dexterity;Decreased knowledge of use of DME;GMC;Decreased knowledge of precautions    Cognitive Skills Attention;Problem Solve;Safety Awareness;Sequencing;Thought;Understand    Rehab Potential Fair    Clinical Decision Making Several treatment options, min-mod task modification necessary    Comorbidities Affecting Occupational Performance: May have comorbidities impacting occupational performance    Modification or Assistance to Complete Evaluation  Min-Moderate modification of tasks or assist with assess necessary to complete eval    OT Frequency 2x / week    OT Duration 12 weeks   plus eval, anticipate d/c after 6-8 weeks dep on pt progress   OT Treatment/Interventions Self-care/ADL training;Therapeutic exercise;Splinting;Manual Therapy;Neuromuscular education;Ultrasound;Aquatic Therapy;Therapeutic activities;Paraffin;DME and/or AE instruction;Cognitive remediation/compensation;Visual/perceptual remediation/compensation;Gait Training;Fluidtherapy;Electrical Stimulation;Moist Heat;Contrast Bath;Passive range of motion;Patient/family education    Plan Continue to work towards goals, add to HEP prn, start checking goals, OT may d/c next week    Consulted and Agree with Plan of Care Patient;Family member/caregiver           Patient will benefit from skilled therapeutic intervention in order to improve the following deficits and impairments:   Body Structure / Function / Physical Skills: ADL, Balance, Mobility, Strength, UE functional use, Tone, FMC, Flexibility, Coordination, Gait, Vision, Sensation, IADL, Dexterity, Decreased knowledge of use of DME,  GMC, Decreased knowledge of precautions Cognitive Skills: Attention, Problem Solve, Safety Awareness, Sequencing, Thought, Understand     Visit Diagnosis: Muscle weakness (generalized)  Other symptoms and signs involving the nervous system  Frontal lobe and executive function deficit  Attention and concentration deficit  Other lack of coordination    Problem List Patient Active Problem List   Diagnosis Date Noted  . Progressive supranuclear palsy (HCC) 10/12/2019  . Encephalomalacia on imaging study 10/12/2019  . Fluency disorder associated with underlying disease 02/08/2018  . Cognitive and neurobehavioral dysfunction 02/08/2018  . 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 Arthritis of carpometacarpal [facility]) joint of left thumb 01/24/2018  . Ankle fracture, left 09/03/2016  . Hand dysfunction 03/11/2016  . Symptomatic varicose veins,  bilateral 03/11/2016  . Numbness of left thumb 03/11/2016  . Elevated BP 07/27/2014  . Bunion of left foot 07/26/2014  . Abnormality of gait 07/26/2014  . Visit for preventive health examination 07/28/2013  . Right anterior knee pain 07/22/2012  . Medicare annual wellness visit, subsequent 07/25/2011  . ANXIETY, SITUATIONAL 09/25/2009  . ARTHRITIS 09/25/2009  . Vitamin D deficiency 09/27/2008  . Hyperlipidemia 09/17/2008  . Elevated blood pressure reading without diagnosis of hypertension 09/17/2008  . COLONIC POLYPS, HX OF 09/17/2008  . PERSONAL HISTORY DISEASES SKIN&SUBCUT TISSUE 09/17/2008    Pamela Alvarez 01/09/2020, 12:17 PM  [facility] [address] [place], [place], [address] Phone: [phone]   Fax:  [phone]  Name: Pamela Alvarez MRN: [id number] Date of Birth: May 20, 1940

## 2020-01-09 NOTE — Therapy (Signed)
[facility] [address] [place], [place], [address] Phone: [phone]   Fax:  [phone]  Speech Language Pathology Treatment  Patient Details  Name: Pamela Alvarez MRN: [id number] Date of Birth: 09-20-1940 Referring Provider (SLP): Dohmeier, Asencion Partridge, MD   Encounter Date: 01/09/2020   End of Session - 01/09/20 1228    Visit Number 9    Number of Visits 17    Date for SLP Re-Evaluation 02/05/20    SLP Start Time 1103    SLP Stop Time  2774    SLP Time Calculation (min) 42 min           Past Medical History:  Diagnosis Date  . Cyst 07-2008   Cyst on back x2 that were drained  . H/O echocardiogram 02-2000  . Hx of colonic polyps   . Hyperlipidemia   . Hypertension   . Varicose veins of both lower extremities     Past Surgical History:  Procedure Laterality Date  . POLYPECTOMY     Colon  . TONSILLECTOMY    . TUBAL LIGATION      There were no vitals filed for this visit.   Subjective Assessment - 01/09/20 1159    Subjective Pt/spouse still awaiting permanent Lingraphica device; to arrive in 1-4 weeks.    Currently in Pain? No/denies                 ADULT SLP TREATMENT - 01/09/20 1211      General Information   Behavior/Cognition Alert;Cooperative      Treatment Provided   Treatment provided Cognitive-Linquistic      Pain Assessment   Pain Assessment No/denies pain      Cognitive-Linquistic Treatment   Treatment focused on Cognition;Aphasia;Patient/family/caregiver education    Skilled Treatment Cancelled appointments last 3 weeks as TouchTalk device has yet to arrive. Pt/spouse kept today's appointment for check-in re: goals, caregiver education and to plan transition of care to new therapist, SLP [place]. Reviewed plan of care and added new LTG for social communication with AAC device. Used clinic [place] device in session with pt, who answered simple questions (SLP/caregiver navigating  to appropriate section) re: preferences by selecting icons on the device, or verbalized response with visual stimulus from device. We discussed option of creating a "conversation" folder with 2 sections: one with stored pt responses/opinions on frequent conversation topics, and another for pt to make comments or ask questions about a variety of topics (I like it/don't like it, I agree/disagree, tell me more about that, what do you think? that's interesting/weird/neat, etc). Spouse agreed he would work on list at home to aid further personalization of pt's permanent device when it arrives. Have placed on hold for ~4 weeks to allow time for insurance approval.      Assessment / Recommendations / Plan   Plan Other (Comment)   hold for ~4 weeks     Progression Toward Goals   Progression toward goals Progressing toward goals              SLP Short Term Goals - 01/09/20 1129      SLP SHORT TERM GOAL #1   Title pt will relay simple biographical information using AAC if necessary with mod cues x 3 visits.    Baseline 12/14/19    Time 1    Period Weeks    Status Partially Met      SLP SHORT TERM GOAL #2   Title pt will respond to simple questions  re: preferences, using AAC if necessary, with mod cues x3 visits.    Baseline 12/12/19 12/14/19, 01/09/20    Time 2    Period Weeks    Status Achieved      SLP SHORT TERM GOAL #3   Title Pt will ask an appropriate question in simple communication exchange, using AAC if necessary, in 2 sessions.    Baseline 12/14/19    Time 2    Period Weeks    Status Partially Met            SLP Long Term Goals - 01/09/20 1121      SLP LONG TERM GOAL #1   Title pt/spouse will report improved communication abilities than prior to beginning ST    Time 4    Period Weeks    Status On-going      SLP LONG TERM GOAL #2   Title Pt/spouse will report using AAC/supportive conversation strategies to resolve communication breakdown outside [place] x 3 visits  with mod  caregiver assistance    Baseline 12/14/19    Time 4    Period Weeks    Status Revised      SLP LONG TERM GOAL #3   Title Pt will make simple requests, using AAC if necessary, outside [place] room x 3 sessions with mod caregiver assistance    Baseline 12/14/19    Time 4   renewed 11/24   Period Weeks    Status Revised   omitted "over three sessions"     SLP LONG TERM GOAL #4   Title Pt will use AAC 3 in a simple social exchange (phone or in person gathering) with mod cues from spouse, x 3 sessions.    Time --   renewed 11/24     SLP LONG TERM GOAL #5   Time --   renewed 11/24           Plan - 01/09/20 1229    Clinical Impression Statement Denell Cothern presents with aphasia and cognitive deficits secondary to PSP, recently diagnosed. Pt/husband report decline in communication abilities over the past 8 months since completing previous ST course and are interested in pursuing AAC to enhance/supplement communication. Patient and spouse have been using Lingraphica Touchtalk at home to aid [place] in making choices and to communicate over the phone with relatives. Will hold ST for ~4 weeks to allow for insurance approval and shipment of permanent communication device. I recommend skilled ST to train pt and spouse in compensations, strategies to maximize communication abilities and educate on appropriate cognitive-linguistic activities to maintain function.    Speech Therapy Frequency 2x / week    Duration --   8 weeks or 17 visits   Treatment/Interventions SLP instruction and feedback;Compensatory strategies;Patient/family education;Multimodal communcation approach;Cueing hierarchy;Language facilitation;Functional tasks;Oral motor exercises;Internal/external aids;Environmental controls;Cognitive reorganization   AAC   Potential to Achieve Goals Fair    Potential Considerations Severity of impairments           Patient will benefit from skilled therapeutic intervention in order to improve the  following deficits and impairments:   Aphasia  Cognitive communication deficit    Problem List Patient Active Problem List   Diagnosis Date Noted  . Progressive supranuclear palsy ([place]) 10/12/2019  . Encephalomalacia on imaging study 10/12/2019  . Fluency disorder associated with underlying disease 02/08/2018  . Cognitive and neurobehavioral dysfunction 02/08/2018  . 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 Arthritis of carpometacarpal [facility]) joint of left thumb 01/24/2018  . Ankle fracture, left  09/03/2016  . Hand dysfunction 03/11/2016  . Symptomatic varicose veins, bilateral 03/11/2016  . Numbness of left thumb 03/11/2016  . Elevated BP 07/27/2014  . Bunion of left foot 07/26/2014  . Abnormality of gait 07/26/2014  . Visit for preventive health examination 07/28/2013  . Right anterior knee pain 07/22/2012  . Medicare annual wellness visit, subsequent 07/25/2011  . ANXIETY, SITUATIONAL 09/25/2009  . ARTHRITIS 09/25/2009  . Vitamin D deficiency 09/27/2008  . Hyperlipidemia 09/17/2008  . Elevated blood pressure reading without diagnosis of hypertension 09/17/2008  . COLONIC POLYPS, HX OF 09/17/2008  . PERSONAL HISTORY DISEASES SKIN&SUBCUT TISSUE 09/17/2008   Deneise Lever, [place], CCC-SLP Speech-Language Pathologist   Aliene Altes 01/09/2020, 12:30 PM  [facility] [address] [place], [place], [address] Phone: [phone]   Fax:  [phone]   Name: Ceili Boshers MRN: [id number] Date of Birth: 11-02-1940

## 2020-01-09 NOTE — Patient Instructions (Addendum)
1. Grip Strengthening (Resistive Putty)-use left hand   Squeeze putty using thumb and all fingers. Repeat _20___ times. Do __2__ sessions per day.   2. Roll putty into tube on table and pinch between fingers and thumb x 10 reps each.   3. Hide small beads or buttons in the putty, try to find them by pinching with left hand.   Copyright  VHI. All rights reserved.

## 2020-01-11 ENCOUNTER — Ambulatory Visit: Payer: Medicare PPO | Admitting: Occupational Therapy

## 2020-01-11 ENCOUNTER — Other Ambulatory Visit: Payer: Self-pay

## 2020-01-11 ENCOUNTER — Ambulatory Visit: Payer: Medicare PPO | Admitting: Speech Pathology

## 2020-01-11 DIAGNOSIS — R2681 Unsteadiness on feet: Secondary | ICD-10-CM | POA: Diagnosis not present

## 2020-01-11 DIAGNOSIS — R4701 Aphasia: Secondary | ICD-10-CM | POA: Diagnosis not present

## 2020-01-11 DIAGNOSIS — M6281 Muscle weakness (generalized): Secondary | ICD-10-CM | POA: Diagnosis not present

## 2020-01-11 DIAGNOSIS — R41844 Frontal lobe and executive function deficit: Secondary | ICD-10-CM

## 2020-01-11 DIAGNOSIS — R278 Other lack of coordination: Secondary | ICD-10-CM | POA: Diagnosis not present

## 2020-01-11 DIAGNOSIS — R29818 Other symptoms and signs involving the nervous system: Secondary | ICD-10-CM | POA: Diagnosis not present

## 2020-01-11 DIAGNOSIS — R41842 Visuospatial deficit: Secondary | ICD-10-CM | POA: Diagnosis not present

## 2020-01-11 DIAGNOSIS — R2689 Other abnormalities of gait and mobility: Secondary | ICD-10-CM | POA: Diagnosis not present

## 2020-01-11 DIAGNOSIS — R41840 Attention and concentration deficit: Secondary | ICD-10-CM

## 2020-01-11 NOTE — Therapy (Signed)
[facility] [address] [place], [place], [address] Phone: [phone]   Fax:  [phone]  Occupational Therapy Treatment  Patient Details  Name: Pamela Alvarez MRN: [id number] Date of Birth: 1941/01/01 Referring Provider (OT): Dr. Vickey Huger   Encounter Date: 01/11/2020   OT End of Session - 01/11/20 1636    Visit Number 11    Number of Visits 25    Date for OT Re-Evaluation 01/22/20    Authorization Type [employer]    Authorization Time Period 16 visitsfor [employer] , 90 day cert    Authorization - Visit Number 11    Authorization - Number of Visits 10    Progress Note Due on Visit 10    OT Start Time 1020    OT Stop Time 1100    OT Time Calculation (min) 40 min    Activity Tolerance Patient tolerated treatment well    Behavior During Therapy Flat affect           Past Medical History:  Diagnosis Date  . Cyst 07-2008   Cyst on back x2 that were drained  . H/O echocardiogram 02-2000  . Hx of colonic polyps   . Hyperlipidemia   . Hypertension   . Varicose veins of both lower extremities     Past Surgical History:  Procedure Laterality Date  . POLYPECTOMY     Colon  . TONSILLECTOMY    . TUBAL LIGATION      There were no vitals filed for this visit.   Subjective Assessment - 01/11/20 1641    Subjective  ok    Pertinent History PSP    Patient Stated Goals to maintain independence, increase left hand use    Currently in Pain? No/denies          Pt denies pain  Treatment:Cane exercises for shoulder flexion, chest press and shoulder abduction, min tactile cues and mod v.c Functional reaching and fine motor coordination activity to place graded clothespins on stand with LUE , min facilitation/ v.c, increased time and v.c. for in hand manipulation. Removing dowel pegs from pegboard then replacing for LUE functional use and in hand manipulation, min v.c Flipping playing cards with LUE, min  difficulty/ v.c Discussed plans for OT d/c next week                       OT Short Term Goals - 01/11/20 1637      OT SHORT TERM GOAL #1   Title I with HEP.    Time 4    Period Weeks    Status Achieved   pt/ husband are aware of functional activities HEP.   Target Date 11/23/19      OT SHORT TERM GOAL #2   Title Pt will use LUE to assist with ADLs/ functional activities at least 10% of the time with min v.c    Baseline uses 5% or less    Time 4    Period Weeks    Status On-going   currently5%     OT SHORT TERM GOAL #3   Title Pt's caregiver will verbalize understanding of adapted strategies to maximize safety and I with ADLs/ IADLs .    Time 4    Period Weeks    Status On-going             OT Long Term Goals - 01/04/20 1049      OT LONG TERM GOAL #1   Title Pt  will perform teeth brushing with improved throughness and only min v.c from husband.    Baseline Pt is not performing thoroughly enough    Time 12    Period Weeks    Status On-going      OT LONG TERM GOAL #2   Title Pt will demonstrate improved ease with self feeding as evidenced by decreasing PPT#2 to 55 secs or less.    Time 12    Period Weeks    Status On-going      OT LONG TERM GOAL #3   Title Pt will demonstrate improved LUE functional use for ADLs as eveidenced by increasing box/ blocks score to 8 blocks for LUE.    Time 12    Period Weeks    Status On-going      OT LONG TERM GOAL #4   Title Pt will demonstrate ability to retrieve a leightweight item at 90 shoulder flexion with -15 elbow extension.    Baseline LUE 8 shoulder flexion , -20 elbow extension.    Time 12    Period Weeks    Status On-going                 Plan - 01/11/20 1640    Clinical Impression Statement Pt's husband reports pt is using L hand more at home. they agree with plans for d/c next week.    OT Occupational Profile and History Detailed Assessment- Review of Records and additional review of  physical, cognitive, psychosocial history related to current functional performance    Occupational performance deficits (Please refer to evaluation for details): ADL's;IADL's;Leisure;Social Participation    Body Structure / Function / Physical Skills ADL;Balance;Mobility;Strength;UE functional use;Tone;FMC;Flexibility;Coordination;Gait;Vision;Sensation;IADL;Dexterity;Decreased knowledge of use of DME;GMC;Decreased knowledge of precautions    Cognitive Skills Attention;Problem Solve;Safety Awareness;Sequencing;Thought;Understand    Rehab Potential Fair    Clinical Decision Making Several treatment options, min-mod task modification necessary    Comorbidities Affecting Occupational Performance: May have comorbidities impacting occupational performance    Modification or Assistance to Complete Evaluation  Min-Moderate modification of tasks or assist with assess necessary to complete eval    OT Frequency 2x / week    OT Duration 12 weeks   plus eval, anticipate d/c after 6-8 weeks dep on pt progress   OT Treatment/Interventions Self-care/ADL training;Therapeutic exercise;Splinting;Manual Therapy;Neuromuscular education;Ultrasound;Aquatic Therapy;Therapeutic activities;Paraffin;DME and/or AE instruction;Cognitive remediation/compensation;Visual/perceptual remediation/compensation;Gait Training;Fluidtherapy;Electrical Stimulation;Moist Heat;Contrast Bath;Passive range of motion;Patient/family education    Plan Continue to work towards goals, add to HEP prn, start checking goals, OT  d/c next week    Consulted and Agree with Plan of Care Patient;Family member/caregiver           Patient will benefit from skilled therapeutic intervention in order to improve the following deficits and impairments:   Body Structure / Function / Physical Skills: ADL, Balance, Mobility, Strength, UE functional use, Tone, FMC, Flexibility, Coordination, Gait, Vision, Sensation, IADL, Dexterity, Decreased knowledge of use of  DME, GMC, Decreased knowledge of precautions Cognitive Skills: Attention, Problem Solve, Safety Awareness, Sequencing, Thought, Understand     Visit Diagnosis: Muscle weakness (generalized)  Other symptoms and signs involving the nervous system  Frontal lobe and executive function deficit  Attention and concentration deficit  Other lack of coordination    Problem List Patient Active Problem List   Diagnosis Date Noted  . Progressive supranuclear palsy ([facility]) 10/12/2019  . Encephalomalacia on imaging study 10/12/2019  . Fluency disorder associated with underlying disease 02/08/2018  . Cognitive and neurobehavioral dysfunction 02/08/2018  . Supranuclear ocular  palsy 02/08/2018  . Arthritis of carpometacarpal [facility]) joint of left thumb 01/24/2018  . Ankle fracture, left 09/03/2016  . Hand dysfunction 03/11/2016  . Symptomatic varicose veins, bilateral 03/11/2016  . Numbness of left thumb 03/11/2016  . Elevated BP 07/27/2014  . Bunion of left foot 07/26/2014  . Abnormality of gait 07/26/2014  . Visit for preventive health examination 07/28/2013  . Right anterior knee pain 07/22/2012  . Medicare annual wellness visit, subsequent 07/25/2011  . ANXIETY, SITUATIONAL 09/25/2009  . ARTHRITIS 09/25/2009  . Vitamin D deficiency 09/27/2008  . Hyperlipidemia 09/17/2008  . Elevated blood pressure reading without diagnosis of hypertension 09/17/2008  . COLONIC POLYPS, HX OF 09/17/2008  . PERSONAL HISTORY DISEASES SKIN&SUBCUT TISSUE 09/17/2008    Pamela Alvarez 01/11/2020, 4:41 PM  [facility] [address] [place], [place], [address] Phone: [phone]   Fax:  [phone]  Name: Pamela Alvarez MRN: [id number] Date of Birth: May 01, 1940

## 2020-01-16 ENCOUNTER — Ambulatory Visit: Payer: Medicare PPO | Admitting: Occupational Therapy

## 2020-01-16 ENCOUNTER — Ambulatory Visit: Payer: Medicare PPO | Admitting: Speech Pathology

## 2020-01-16 ENCOUNTER — Other Ambulatory Visit: Payer: Self-pay

## 2020-01-16 DIAGNOSIS — R2689 Other abnormalities of gait and mobility: Secondary | ICD-10-CM | POA: Diagnosis not present

## 2020-01-16 DIAGNOSIS — R29818 Other symptoms and signs involving the nervous system: Secondary | ICD-10-CM

## 2020-01-16 DIAGNOSIS — R41844 Frontal lobe and executive function deficit: Secondary | ICD-10-CM

## 2020-01-16 DIAGNOSIS — R2681 Unsteadiness on feet: Secondary | ICD-10-CM | POA: Diagnosis not present

## 2020-01-16 DIAGNOSIS — R41842 Visuospatial deficit: Secondary | ICD-10-CM | POA: Diagnosis not present

## 2020-01-16 DIAGNOSIS — R278 Other lack of coordination: Secondary | ICD-10-CM | POA: Diagnosis not present

## 2020-01-16 DIAGNOSIS — R41840 Attention and concentration deficit: Secondary | ICD-10-CM

## 2020-01-16 DIAGNOSIS — M6281 Muscle weakness (generalized): Secondary | ICD-10-CM | POA: Diagnosis not present

## 2020-01-16 DIAGNOSIS — R4701 Aphasia: Secondary | ICD-10-CM | POA: Diagnosis not present

## 2020-01-16 NOTE — Progress Notes (Signed)
Chief Complaint  Patient presents with  . Follow-up    HPI: Pamela Alvarez 79 y.o. come in for Chronic disease management   Fu DVT here with husband caretaker   Leg Better  Still swollen.  No warmth elevated  Many hours per day . Doesn't seem to be bothering her  No bleeding no falling    ROS: See pertinent positives and negatives per HPI. No resp sx  Working on getting ltc  help  Past Medical History:  Diagnosis Date  . Cyst 07-2008   Cyst on back x2 that were drained  . H/O echocardiogram 02-2000  . Hx of colonic polyps   . Hyperlipidemia   . Hypertension   . Varicose veins of both lower extremities     Family History  Problem Relation Age of Onset  . Hypertension Mother   . Parkinsonism Mother   . Angina Father   . Heart disease Father   . Colon cancer Father   . Lung cancer Father   . Skin cancer Father   . Cancer Father        Heart  . Breast cancer Sister        over 84  . Diabetes Sister   . Heart disease Brother 59       triple bypass surgery  . Macular degeneration Sister   . Diabetes Sister        prediabetic  . Hearing loss Sister        hearing problems  . Vasculitis Sister     Social History   Socioeconomic History  . Marital status: Married    Spouse name: [name]  . Number of children: 2  . Years of education: Not on file  . Highest education level: Doctorate  Occupational History  . Occupation: retired    Comment: Social research officer, government [name]  Tobacco Use  . Smoking status: Never Smoker  . Smokeless tobacco: Never Used  Substance and Sexual Activity  . Alcohol use: Yes    Alcohol/week: 7.0 standard drinks    Types: 7 Glasses of wine per week    Comment: one a day  . Drug use: Not Currently  . Sexual activity: Not on file  Other Topics Concern  . Not on file  Social History Narrative   Regular exercise-yes   Moved back to [place] from [place] of 2 cat   G3 P2    Exercises walking regu;arly    Retired  Social research officer, government   Drinks  one cup of coffee  A day. In addition to walking QD she and her husband take a pilates class on Thursdays. She lives with her husband in a 2 story house, though the master bedroom in on the first level.       Social Determinants of Health   Financial Resource Strain:   . Difficulty of Paying Living Expenses: Not on file  Food Insecurity:   . Worried About Programme researcher, broadcasting/film/video in the Last Year: Not on file  . Ran Out of Food in the Last Year: Not on file  Transportation Needs:   . Lack of Transportation (Medical): Not on file  . Lack of Transportation (Non-Medical): Not on file  Physical Activity:   . Days of Exercise per Week: Not on file  . Minutes of Exercise per Session: Not on file  Stress:   . Feeling of Stress : Not on file  Social Connections:   . Frequency of Communication with Friends and  Family: Not on file  . Frequency of Social Gatherings with Friends and Family: Not on file  . Attends Religious Services: Not on file  . Active Member of Clubs or Organizations: Not on file  . Attends Banker Meetings: Not on file  . Marital Status: Not on file    Outpatient Medications Prior to Visit  Medication Sig Dispense Refill  . atorvastatin (LIPITOR) 10 MG tablet TAKE 1 TABLET BY MOUTH EVERY DAY 90 tablet 2  . carbidopa-levodopa (SINEMET IR) 25-100 MG tablet TAKE 1 TABLET BY MOUTH THREE TIMES A DAY 90 tablet 2  . Cholecalciferol (VITAMIN D3 PO) Take 1 tablet by mouth daily.    Marland Kitchen losartan (COZAAR) 25 MG tablet Take 25 mg by mouth daily.    . vitamin B-12 (CYANOCOBALAMIN) 1000 MCG tablet Take 1,000 mcg by mouth 3 (three) times a week.    Marland Kitchen apixaban (ELIQUIS) 5 MG TABS tablet Take 2 tablets (10mg ) twice daily for 7 days, then 1 tablet (5mg ) twice daily 72 tablet 1   No facility-administered medications prior to visit.     EXAM:  BP 132/84   Pulse 86   Temp 98.5 F (36.9 C) (Oral)   Ht 5\' 2"  (1.575 m)   Wt 150 lb 9.6 oz (68.3 kg)   SpO2 96%   BMI 27.55 kg/m    Body mass index is 27.55 kg/m.  GENERAL: vitals reviewed and listed above, alert, I wc  responsive parse of speech, appears well hydrated and in no acute distress HEENT: atraumatic, conjunctiva  clear, no obvious abnormalities on inspection of external nose and ears OP : masked NECK: no obvious masses on inspection palpation  LUNGS:nl resp  CV: HRRR, no clubbing cyanosisa nl cap refill  lle  Mild swelling not  Tight no warmth  No lesion or pain obvious  MS: moves all extremities without noticeable focal  Abnormality( hx old ankle injury)  PSYCH: pleasant and cooperative,   Some vacant  Lab Results  Component Value Date   WBC 7.6 12/22/2019   HGB 14.3 12/22/2019   HCT 42.8 12/22/2019   PLT 300 12/22/2019   GLUCOSE 106 (H) 12/22/2019   CHOL 202 (H) 12/22/2019   TRIG 110 12/22/2019   HDL 69 12/22/2019   LDLDIRECT 191.8 07/22/2012   LDLCALC 111 (H) 12/22/2019   ALT 9 12/22/2019   AST 18 12/22/2019   NA 140 12/22/2019   K 4.3 12/22/2019   CL 105 12/22/2019   CREATININE 1.13 (H) 12/22/2019   BUN 20 12/22/2019   CO2 25 12/22/2019   TSH 2.10 12/22/2019   HGBA1C 5.7 12/23/2018   BP Readings from Last 3 Encounters:  01/17/20 132/84  12/28/19 135/79  12/25/19 128/70    ASSESSMENT AND PLAN:  Discussed the following assessment and plan:  Acute deep vein thrombosis (DVT) of femoral vein of left lower extremity (HCC) - dx 10 18 21  doppler  Medication management Much improved clinically and  No se bleeding issue cont on med  Total 3-6 months and then reasses and go off oif appropriate.  Ok to inc activity at this time  As tolerated .  -Patient advised to return or notify health care team  if  new concerns arise.  Patient Instructions  Leg looks a lot better.LE. OK to   Increase LE  Activity  As tolerated.  Continue. Anticoagulant.    3-6 months .     13/10/21. Jaila Schellhorn M.D.

## 2020-01-17 ENCOUNTER — Encounter: Payer: Self-pay | Admitting: Internal Medicine

## 2020-01-17 ENCOUNTER — Ambulatory Visit: Payer: Medicare PPO | Admitting: Internal Medicine

## 2020-01-17 VITALS — BP 132/84 | HR 86 | Temp 98.5°F | Ht 62.0 in | Wt 150.6 lb

## 2020-01-17 DIAGNOSIS — I82412 Acute embolism and thrombosis of left femoral vein: Secondary | ICD-10-CM | POA: Diagnosis not present

## 2020-01-17 DIAGNOSIS — Z79899 Other long term (current) drug therapy: Secondary | ICD-10-CM | POA: Diagnosis not present

## 2020-01-17 DIAGNOSIS — Z0289 Encounter for other administrative examinations: Secondary | ICD-10-CM

## 2020-01-17 MED ORDER — APIXABAN 5 MG PO TABS: 5.0000 mg | ORAL_TABLET | Freq: Two times a day (BID) | ORAL | 3 refills | Status: DC

## 2020-01-17 NOTE — Therapy (Signed)
[facility] [address] [place], [place], [address] Phone: [phone]   Fax:  [phone]  Occupational Therapy Treatment  Patient Details  Name: Pamela Alvarez MRN: [id number] Date of Birth: 1940/08/01 Referring Provider (OT): Dr. Vickey Huger   Encounter Date: 01/16/2020   OT End of Session - 01/17/20 0839    Visit Number 12    Number of Visits 25    Date for OT Re-Evaluation 01/22/20    Authorization Type [employer]    Authorization Time Period 16 visitsfor [employer] , 90 day cert    Authorization - Visit Number 12    Authorization - Number of Visits 20    Progress Note Due on Visit 10    OT Start Time 1020    OT Stop Time 1100    OT Time Calculation (min) 40 min    Activity Tolerance Patient tolerated treatment well    Behavior During Therapy Flat affect           Past Medical History:  Diagnosis Date   Cyst 07-2008   Cyst on back x2 that were drained   H/O echocardiogram 02-2000   Hx of colonic polyps    Hyperlipidemia    Hypertension    Varicose veins of both lower extremities     Past Surgical History:  Procedure Laterality Date   POLYPECTOMY     Colon   TONSILLECTOMY     TUBAL LIGATION      There were no vitals filed for this visit.   Subjective Assessment - 01/17/20 0841    Subjective  denies pain    Pertinent History PSP    Patient Stated Goals to maintain independence, increase left hand use    Currently in Pain? No/denies                  Treatment:Cane exercises for shoulder flexion, chest press and shoulder abduction, min tactile cues and mod v.c Placing large to small pegs in semicircle pegboard for increased LUE functional use and fine motor coordination, (oven mitt placed on RUE to discourage use), min-mod v.c. Pt demonstrates improved LUE functional use and in hand manipulation with LUE today. Pt's husband reports pt requested to perform activities at  home)                OT Short Term Goals - 01/11/20 1637      OT SHORT TERM GOAL #1   Title I with HEP.    Time 4    Period Weeks    Status Achieved   pt/ husband are aware of functional activities HEP.   Target Date 11/23/19      OT SHORT TERM GOAL #2   Title Pt will use LUE to assist with ADLs/ functional activities at least 10% of the time with min v.c    Baseline uses 5% or less    Time 4    Period Weeks    Status On-going   currently5%     OT SHORT TERM GOAL #3   Title Pt's caregiver will verbalize understanding of adapted strategies to maximize safety and I with ADLs/ IADLs .    Time 4    Period Weeks    Status On-going             OT Long Term Goals - 01/17/20 0840      OT LONG TERM GOAL #1   Title Pt will perform teeth brushing with improved throughness and only min v.c  from husband.    Baseline Pt is not performing thoroughly enough    Time 12    Period Weeks    Status Deferred   Pt declines to work on this right now     OT LONG TERM GOAL #2   Title Pt will demonstrate improved ease with self feeding as evidenced by decreasing PPT#2 to 55 secs or less.    Time 12    Period Weeks    Status On-going      OT LONG TERM GOAL #3   Title Pt will demonstrate improved LUE functional use for ADLs as eveidenced by increasing box/ blocks score to 8 blocks for LUE.    Time 12    Period Weeks    Status On-going      OT LONG TERM GOAL #4   Title Pt will demonstrate ability to retrieve a leightweight item at 90 shoulder flexion with -15 elbow extension.    Baseline LUE 8 shoulder flexion , -20 elbow extension.    Time 12    Period Weeks    Status On-going                  Patient will benefit from skilled therapeutic intervention in order to improve the following deficits and impairments:           Visit Diagnosis: Muscle weakness (generalized)  Other symptoms and signs involving the nervous system  Frontal lobe and executive  function deficit  Attention and concentration deficit  Other lack of coordination    Problem List Patient Active Problem List   Diagnosis Date Noted   Progressive supranuclear palsy (HCC) 10/12/2019   Encephalomalacia on imaging study 10/12/2019   Fluency disorder associated with underlying disease 02/08/2018   Cognitive and neurobehavioral dysfunction 02/08/2018   Supranuclear ocular palsy 02/08/2018   Arthritis of carpometacarpal (CMC) joint of left thumb 01/24/2018   Ankle fracture, left 09/03/2016   Hand dysfunction 03/11/2016   Symptomatic varicose veins, bilateral 03/11/2016   Numbness of left thumb 03/11/2016   Elevated BP 07/27/2014   Bunion of left foot 07/26/2014   Abnormality of gait 07/26/2014   Visit for preventive health examination 07/28/2013   Right anterior knee pain 07/22/2012   Medicare annual wellness visit, subsequent 07/25/2011   ANXIETY, SITUATIONAL 09/25/2009   ARTHRITIS 09/25/2009   Vitamin D deficiency 09/27/2008   Hyperlipidemia 09/17/2008   Elevated blood pressure reading without diagnosis of hypertension 09/17/2008   COLONIC POLYPS, HX OF 09/17/2008   PERSONAL HISTORY DISEASES SKIN&SUBCUT TISSUE 09/17/2008    Pamela Alvarez 01/17/2020, 8:42 AM  [facility] [address] [place], [place], [address] Phone: [phone]   Fax:  [phone]  Name: Pamela Alvarez MRN: [id number] Date of Birth: [birth year]

## 2020-01-17 NOTE — Patient Instructions (Addendum)
Leg looks a lot better.LE. OK to   Increase LE  Activity  As tolerated.  Continue. Anticoagulant.    3-6 months .

## 2020-01-18 ENCOUNTER — Other Ambulatory Visit: Payer: Self-pay

## 2020-01-18 ENCOUNTER — Ambulatory Visit: Payer: Medicare PPO | Admitting: Occupational Therapy

## 2020-01-18 ENCOUNTER — Encounter: Payer: Medicare PPO | Admitting: Speech Pathology

## 2020-01-18 DIAGNOSIS — R41840 Attention and concentration deficit: Secondary | ICD-10-CM | POA: Diagnosis not present

## 2020-01-18 DIAGNOSIS — R2689 Other abnormalities of gait and mobility: Secondary | ICD-10-CM | POA: Diagnosis not present

## 2020-01-18 DIAGNOSIS — R278 Other lack of coordination: Secondary | ICD-10-CM

## 2020-01-18 DIAGNOSIS — R2681 Unsteadiness on feet: Secondary | ICD-10-CM | POA: Diagnosis not present

## 2020-01-18 DIAGNOSIS — R41844 Frontal lobe and executive function deficit: Secondary | ICD-10-CM | POA: Diagnosis not present

## 2020-01-18 DIAGNOSIS — M6281 Muscle weakness (generalized): Secondary | ICD-10-CM

## 2020-01-18 DIAGNOSIS — R29818 Other symptoms and signs involving the nervous system: Secondary | ICD-10-CM | POA: Diagnosis not present

## 2020-01-18 DIAGNOSIS — R41842 Visuospatial deficit: Secondary | ICD-10-CM

## 2020-01-18 DIAGNOSIS — R4701 Aphasia: Secondary | ICD-10-CM | POA: Diagnosis not present

## 2020-01-19 ENCOUNTER — Encounter: Payer: Self-pay | Admitting: Occupational Therapy

## 2020-01-19 DIAGNOSIS — G231 Progressive supranuclear ophthalmoplegia [Steele-Richardson-Olszewski]: Secondary | ICD-10-CM | POA: Diagnosis not present

## 2020-01-19 DIAGNOSIS — R4701 Aphasia: Secondary | ICD-10-CM | POA: Diagnosis not present

## 2020-01-19 DIAGNOSIS — R471 Dysarthria and anarthria: Secondary | ICD-10-CM | POA: Diagnosis not present

## 2020-01-19 NOTE — Therapy (Signed)
[facility] [address] [place], [place], [address] Phone: [phone]   Fax:  [phone]  Occupational Therapy Treatment  Patient Details  Name: Pamela Alvarez MRN: [id number] Date of Birth: 02/17/1941 Referring Provider (OT): Dr. Brett Fairy   Encounter Date: 01/18/2020   OT End of Session - 01/19/20 0835    Visit Number 13    Number of Visits 25    Date for OT Re-Evaluation 01/22/20    Authorization Type [employer]    Authorization Time Period 64 visitsfor [employer] , 90 day cert    Authorization - Visit Number 13    Authorization - Number of Visits 20    Progress Note Due on Visit 10    OT Start Time 1020    OT Stop Time 1100    OT Time Calculation (min) 40 min    Activity Tolerance Patient tolerated treatment well    Behavior During Therapy Flat affect           Past Medical History:  Diagnosis Date  . Cyst 07-2008   Cyst on back x2 that were drained  . H/O echocardiogram 02-2000  . Hx of colonic polyps   . Hyperlipidemia   . Hypertension   . Varicose veins of both lower extremities     Past Surgical History:  Procedure Laterality Date  . POLYPECTOMY     Colon  . TONSILLECTOMY    . TUBAL LIGATION      There were no vitals filed for this visit.   Subjective Assessment - 01/19/20 0835    Subjective  denies pain, pt/ husband agree with d/c    Patient Stated Goals to maintain independence, increase left hand use    Currently in Pain? No/denies               Treatment: Therapist checked progress towards goals.  Functional use of LUE to place large to small pegs in pegboard, min v.c for LUE use and in hand manipulation. Pt continues to require v.c to avoid use of RUE. Pt's husband reports pt is using LUE at home more.                  OT Education - 01/19/20 [phone]    Education Details Discussed with pt/ husband importance of pt's continued functional use of LUE. Pt's husband  verbalizes understanding of HEP for functional activities and cane exercises. Pt will have hired caregiver's assisting.  Cane exercises for shoulder flexion abduction, biceps curls and chest press in seated.Therapist checked progress towards goals.    Person(s) Educated Patient;Spouse    Methods Explanation;Demonstration;Verbal cues    Comprehension Verbalized understanding;Returned demonstration;Verbal cues required;Tactile cues required            OT Short Term Goals - 01/18/20 1030      OT SHORT TERM GOAL #1   Title I with HEP.    Time 4    Period Weeks    Status Achieved   pt/ husband are aware of functional activities HEP.   Target Date 11/23/19      OT SHORT TERM GOAL #2   Title Pt will use LUE to assist with ADLs/ functional activities at least 10% of the time with min v.c    Baseline uses 5% or less    Time 4    Period Weeks    Status Achieved   grossly 10%     OT SHORT TERM GOAL #3   Title Pt's caregiver will  verbalize understanding of adapted strategies to maximize safety and I with ADLs/ IADLs .    Time 4    Period Weeks    Status Achieved             OT Long Term Goals - 01/18/20 1032      OT LONG TERM GOAL #1   Title Pt will perform teeth brushing with improved throughness and only min v.c from husband.    Baseline Pt is not performing thoroughly enough    Time 12    Period Weeks    Status Deferred   Pt declines to work on this right now     [place] #2   Title Pt will demonstrate improved ease with self feeding as evidenced by decreasing PPT#2 to 55 secs or less.    Time 12    Period Weeks    Status Achieved   45 secs     OT LONG TERM GOAL #3   Title Pt will demonstrate improved LUE functional use for ADLs as eveidenced by increasing box/ blocks score to 8 blocks for LUE.    Time 12    Period Weeks    Status Partially Met   6 blocks, 8 blocks     OT LONG TERM GOAL #4   Title Pt will demonstrate ability to retrieve a leightweight item at  90 shoulder flexion with -15 elbow extension.    Baseline LUE 80 shoulder flexion , -20 elbow extension.    Time 12    Period Weeks    Status Partially Met   90, -20                Plan - 01/19/20 0865    Clinical Impression Statement Pt and husband agree with plans for d/c from OT.    OT Occupational Profile and History Detailed Assessment- Review of Records and additional review of physical, cognitive, psychosocial history related to current functional performance    Occupational performance deficits (Please refer to evaluation for details): ADL's;IADL's;Leisure;Social Participation    Body Structure / Function / Physical Skills ADL;Balance;Mobility;Strength;UE functional use;Tone;FMC;Flexibility;Coordination;Gait;Vision;Sensation;IADL;Dexterity;Decreased knowledge of use of DME;GMC;Decreased knowledge of precautions    Cognitive Skills Attention;Problem Solve;Safety Awareness;Sequencing;Thought;Understand    Rehab Potential Fair    Clinical Decision Making Several treatment options, min-mod task modification necessary    Comorbidities Affecting Occupational Performance: May have comorbidities impacting occupational performance    Modification or Assistance to Complete Evaluation  Min-Moderate modification of tasks or assist with assess necessary to complete eval    OT Frequency 2x / week    OT Duration 12 weeks   plus eval, anticipate d/c after 6-8 weeks dep on pt progress   OT Treatment/Interventions Self-care/ADL training;Therapeutic exercise;Splinting;Manual Therapy;Neuromuscular education;Ultrasound;Aquatic Therapy;Therapeutic activities;Paraffin;DME and/or AE instruction;Cognitive remediation/compensation;Visual/perceptual remediation/compensation;Gait Training;Fluidtherapy;Electrical Stimulation;Moist Heat;Contrast Bath;Passive range of motion;Patient/family education    Plan d/c OT    Consulted and Agree with Plan of Care Patient;Family member/caregiver           Patient  will benefit from skilled therapeutic intervention in order to improve the following deficits and impairments:   Body Structure / Function / Physical Skills: ADL, Balance, Mobility, Strength, UE functional use, Tone, FMC, Flexibility, Coordination, Gait, Vision, Sensation, IADL, Dexterity, Decreased knowledge of use of DME, GMC, Decreased knowledge of precautions Cognitive Skills: Attention, Problem Solve, Safety Awareness, Sequencing, Thought, Understand     Visit Diagnosis: Muscle weakness (generalized)  Other symptoms and signs involving the nervous system  Frontal lobe and executive  function deficit  Attention and concentration deficit  Other lack of coordination  Visuospatial deficit   OCCUPATIONAL THERAPY DISCHARGE SUMMARY    Current functional level related to goals / functional outcomes: Pt made progress towards goals, however she did not fully achieve all goals due to severity of deficits. Pt's husband reports pt is using LUE at home more now.   Remaining deficits: Bradykinsia, decreased coordination, cognitive deficits, decreased balance, decreased ROM, rigidity, visual deficits   Education / Equipment: Pt/ husband were instructed in :HEP, importance of functional use of LUE and ADL strategies.Pt's husband verbalized understanding of all education. Plan: Patient agrees to discharge.  Patient goals were partially met. Patient is being discharged due to being pleased with the current functional level.  ?????      Problem List Patient Active Problem List   Diagnosis Date Noted  . Progressive supranuclear palsy ([place]) 10/12/2019  . Encephalomalacia on imaging study 10/12/2019  . Fluency disorder associated with underlying disease 02/08/2018  . Cognitive and neurobehavioral dysfunction 02/08/2018  . 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 Arthritis of carpometacarpal [facility]) joint of left thumb 01/24/2018  . Ankle fracture, left 09/03/2016  . Hand dysfunction  03/11/2016  . Symptomatic varicose veins, bilateral 03/11/2016  . Numbness of left thumb 03/11/2016  . Elevated BP 07/27/2014  . Bunion of left foot 07/26/2014  . Abnormality of gait 07/26/2014  . Visit for preventive health examination 07/28/2013  . Right anterior knee pain 07/22/2012  . Medicare annual wellness visit, subsequent 07/25/2011  . ANXIETY, SITUATIONAL 09/25/2009  . ARTHRITIS 09/25/2009  . Vitamin D deficiency 09/27/2008  . Hyperlipidemia 09/17/2008  . Elevated blood pressure reading without diagnosis of hypertension 09/17/2008  . COLONIC POLYPS, HX OF 09/17/2008  . PERSONAL HISTORY DISEASES SKIN&SUBCUT TISSUE 09/17/2008    Karo Rog 01/19/2020, 8:39 AM Theone Murdoch, OTR/L [phone] Phone: [phone] 8:41 AM 01/19/20 [facility] [address] [place], [place], [address] Phone: [phone]   Fax:  [phone]  Name: Jozy Mcphearson MRN: [id number] Date of Birth: 04/09/1940

## 2020-01-22 ENCOUNTER — Ambulatory Visit: Payer: Medicare PPO | Admitting: Physical Therapy

## 2020-01-23 ENCOUNTER — Other Ambulatory Visit: Payer: Self-pay | Admitting: Neurology

## 2020-01-24 ENCOUNTER — Encounter: Payer: Self-pay | Admitting: Physical Therapy

## 2020-01-24 ENCOUNTER — Ambulatory Visit: Payer: Medicare PPO | Admitting: Physical Therapy

## 2020-01-24 ENCOUNTER — Other Ambulatory Visit: Payer: Self-pay

## 2020-01-24 DIAGNOSIS — R4701 Aphasia: Secondary | ICD-10-CM | POA: Diagnosis not present

## 2020-01-24 DIAGNOSIS — R41840 Attention and concentration deficit: Secondary | ICD-10-CM | POA: Diagnosis not present

## 2020-01-24 DIAGNOSIS — M6281 Muscle weakness (generalized): Secondary | ICD-10-CM

## 2020-01-24 DIAGNOSIS — R2689 Other abnormalities of gait and mobility: Secondary | ICD-10-CM | POA: Diagnosis not present

## 2020-01-24 DIAGNOSIS — R41842 Visuospatial deficit: Secondary | ICD-10-CM | POA: Diagnosis not present

## 2020-01-24 DIAGNOSIS — R2681 Unsteadiness on feet: Secondary | ICD-10-CM | POA: Diagnosis not present

## 2020-01-24 DIAGNOSIS — R29818 Other symptoms and signs involving the nervous system: Secondary | ICD-10-CM | POA: Diagnosis not present

## 2020-01-24 DIAGNOSIS — R278 Other lack of coordination: Secondary | ICD-10-CM | POA: Diagnosis not present

## 2020-01-24 DIAGNOSIS — R41844 Frontal lobe and executive function deficit: Secondary | ICD-10-CM | POA: Diagnosis not present

## 2020-01-24 NOTE — Therapy (Addendum)
[facility] [address] [place], [place], [address] Phone: [phone]   Fax:  [phone]  Physical Therapy Treatment/RECERT Note   Patient Details  Name: Pamela Alvarez MRN: [id number] Date of Birth: 1941-03-04 Referring Provider (PT): Dohmeier, Asencion Partridge   Encounter Date: 01/24/2020   PT End of Session - 01/24/20 0001    Visit Number 13    Number of Visits 25    Date for PT Re-Evaluation 01/21/20    Authorization Type [employer] Medicare-submitted auth at completion of eval; new auth request for 10 additional visits requested after visit 12/21/2019    Authorization Time Period 10/21-12/30/21 for 10 visits    Authorization - Visit Number 1    Authorization - Number of Visits 10    Progress Note Due on Visit 20    PT Start Time 1211    PT Stop Time 1251    PT Time Calculation (min) 40 min    Equipment Utilized During Treatment Gait belt    Activity Tolerance Patient limited by fatigue    Behavior During Therapy Flat affect           Past Medical History:  Diagnosis Date  . Cyst 07-2008   Cyst on back x2 that were drained  . H/O echocardiogram 02-2000  . Hx of colonic polyps   . Hyperlipidemia   . Hypertension   . Varicose veins of both lower extremities     Past Surgical History:  Procedure Laterality Date  . POLYPECTOMY     Colon  . TONSILLECTOMY    . TUBAL LIGATION      There were no vitals filed for this visit.   Subjective Assessment - 01/24/20 1224    Subjective Husband reports MD cleared pt to resume activity.  Pt still on blood thinner for 3-6 months.  Has not been walking at home due to DVT.  Husband feels like pt has declinced since last session and is worried that some "may never come back".    Patient is accompained by: Family member   Husband-provides history   Patient Stated Goals To try to maintain as much as possible, try to prevent regression as much as possible.    Currently in Pain?  No/denies                             [facility] Adult PT Treatment/Exercise - 01/24/20 0001      Transfers   Transfers Sit to Stand;Stand to Sit    Sit to Stand 3: Mod assist    Sit to Stand Details Tactile cues for initiation;Tactile cues for sequencing;Tactile cues for placement;Verbal cues for technique;Verbal cues for precautions/safety;Manual facilitation for weight shifting;Manual facilitation for placement    Sit to Stand Details (indicate cue type and reason) Pt needing significant assistance and cues today compared to previous sessions before DVT.  Increased difficulty processing commands as well.    Stand to Sit 3: Mod assist    Stand to Sit Details (indicate cue type and reason) Tactile cues for initiation;Tactile cues for placement;Manual facilitation for placement;Manual facilitation for weight shifting    Stand to Sit Details Pt attempting to sit before completely turned to chair, does not reach back for arms rests, posterior lean into chair at times pushing chair backwards    Number of Reps Other reps (comment)   5 then several times during session     Ambulation/Gait   Ambulation/Gait Yes  Ambulation/Gait Assistance 3: Mod assist;4: Min assist    Ambulation Distance (Feet) 110 Feet   x 2   Assistive device Rollator    Gait Pattern Step-through pattern;Step-to pattern;Decreased step length - right;Decreased step length - left;Decreased stride length;Decreased dorsiflexion - right;Decreased dorsiflexion - left;Trunk flexed;Poor foot clearance - left;Poor foot clearance - right;Trunk rotated posteriorly on left    Ambulation Surface Level;Indoor    Gait Comments Husband reports that he and pt have been doing minimal walking due to DVT and have not been using Rollator but he is doing bil HHA in front of pt.  He reports feeling safer with this method due to having a better hold on pt.  Pt with increased difficulty processing walking with Rollator today.  Did do  slightly better with second rep.  Needs cues for step length, especially on the L along with staying close to rollator.  Pt needed 1 standing rest break during both bouts of ambulation.      Standardized Balance Assessment   Standardized Balance Assessment Timed Up and Go Test      Timed Up and Go Test   TUG Normal TUG   needing mod assist during TUG   Normal TUG (seconds) 120   pt with difficulty following commands for TUG     Exercises   Exercises Knee/Hip;Other Exercises      Knee/Hip Exercises: Aerobic   Other Aerobic Foot pedaler x 5 minutes with pt seated in chair and 2 short rest breaks.        Knee/Hip Exercises: Seated   Long Arc Quad AROM;Strengthening;Both;1 set;10 reps;Limitations    Long [employer] Limitations cues for full extension on the [employer] 10 reps with pillow between knees    Other Seated Knee/Hip Exercises in LAQ position performed ankle pumps x 10 each side    Marching AROM;Strengthening;Both;2 sets;10 reps;Limitations    Marching Limitations cues for increased ROM on the L side                    PT Short Term Goals - 01/24/20 1313      PT SHORT TERM GOAL #1   Title Pt will perform at least 4 minutes of continuous gait activity with rollator, min assist, for improved endurance for gait in the home.  TARGET 12/29/2019    Baseline 11/17//21-pt requires mod assist to amb with rollator and only able to amb 110' with standing rest break    Time 8    Period Weeks    Status Not Met      PT SHORT TERM GOAL #2   Title Pt will perform 10 reps of sit<>stand with minimal assistance for improved transfer efficiency and functional strength.    Baseline 5 reps with mod assist on 01/24/20    Time 8    Period Weeks    Status Not Met      PT SHORT TERM GOAL #3   Title Pt will improve TUG score to less than or equal to 45 seconds for decreased fall risk.    Baseline 53.69 sec at eval; 53.91 11/28/2019;120 sec on 01/24/20    Time 8    Period Weeks      Status Not Met             PT Long Term Goals - 10/25/19 0735      PT LONG TERM GOAL #1   Title Pt/husband will verbalize and demonstrate understanding of final HEP to  maintain functional gains made in therapy.  TARGET 01/12/2020    Time 12    Period Weeks    Status New      PT LONG TERM GOAL #2   Title Pt will perform TUG shuttle activity with rollator and min guard, at least 3 sets without break, to demonstrate improved transfer efficiency and safety.    Time 12    Period Weeks    Status New      PT LONG TERM GOAL #3   Title Pt will improve TUG score to less than or equal to 30 seconds for decreased fall risk/improved mobility in the home.    Time 12    Status New      PT LONG TERM GOAL #4   Title Pt/husband will verbalize understanding of fall prevention in home environment.    Time 12    Period Weeks    Status New      PT LONG TERM GOAL #5   Title Pt will improve gait velocity to at least 1.1 ft/sec for improved gait efficiency and safety in the home.    Time 12    Period Weeks    Status New                 Plan - 01/24/20 1316    Clinical Impression Statement Pt returns today after DVT in LLE.  Pt has been cleared by MD to resume activity as tolerated.  Pt denies any pain in LLE.  Pt did not meet STG's due to limited activity for past 4 weeks due to DVT.    Personal Factors and Comorbidities Comorbidity 3+;Other    Comorbidities See problem list; recent diagnosis of PSP    Examination-Activity Limitations Locomotion Level;Transfers;Dressing;Stairs;Stand;Toileting    Examination-Participation Restrictions Community Activity;Shop    Stability/Clinical Decision Making Evolving/Moderate complexity    Rehab Potential Good    PT Frequency 2x / week    PT Duration 12 weeks   including eval week   PT Treatment/Interventions ADLs/Self Care Home Management;Gait training;Functional mobility training;Therapeutic activities;Therapeutic exercise;Balance  training;Neuromuscular re-education;Patient/family education    Consulted and Agree with Plan of Care Patient;Family member/caregiver    Family Member Consulted Husband           Patient will benefit from skilled therapeutic intervention in order to improve the following deficits and impairments:  Abnormal gait, Difficulty walking, Decreased safety awareness, Decreased endurance, Decreased activity tolerance, Decreased balance, Decreased mobility, Decreased strength, Postural dysfunction  Visit Diagnosis: Other abnormalities of gait and mobility  Muscle weakness (generalized)     Problem List Patient Active Problem List   Diagnosis Date Noted  . Progressive supranuclear palsy ([place]) 10/12/2019  . Encephalomalacia on imaging study 10/12/2019  . Fluency disorder associated with underlying disease 02/08/2018  . Cognitive and neurobehavioral dysfunction 02/08/2018  . 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 Arthritis of carpometacarpal [facility]) joint of left thumb 01/24/2018  . Ankle fracture, left 09/03/2016  . Hand dysfunction 03/11/2016  . Symptomatic varicose veins, bilateral 03/11/2016  . Numbness of left thumb 03/11/2016  . Elevated BP 07/27/2014  . Bunion of left foot 07/26/2014  . Abnormality of gait 07/26/2014  . Visit for preventive health examination 07/28/2013  . Right anterior knee pain 07/22/2012  . Medicare annual wellness visit, subsequent 07/25/2011  . ANXIETY, SITUATIONAL 09/25/2009  . ARTHRITIS 09/25/2009  . Vitamin D deficiency 09/27/2008  . Hyperlipidemia 09/17/2008  . Elevated blood pressure reading without diagnosis of hypertension 09/17/2008  . COLONIC POLYPS,  HX OF 09/17/2008  . PERSONAL HISTORY DISEASES SKIN&SUBCUT TISSUE 09/17/2008    Narda Bonds, [place] 01/24/20 1:19 PM Phone: [phone] Fax: [facility] [address] [place], [place], [address] Phone: [phone]   Fax:  [phone]  Name: Pamela Alvarez MRN: [id number] Date of Birth: 1940/10/21   PTA and PT discussed pt's return to therapy, with therapist reviewing noted from MD.  Pt appears to be cleared for return to therapy.  PTA/PT in discussion, with PT recommending recert for PT to capture more of the patient's initial POC, as pt has been on hold due to DVT.  Please see below for updates:   01/24/20 0001  PT Visits / Re-Eval  Visit Number 13  Number of Visits 23  Date for PT Re-Evaluation 90/24/0973 (per recert 53/29/9242)  Authorization  Authorization Type [employer] Medicare-submitted auth at completion of eval; new auth request for 10 additional visits requested after visit 12/21/2019  Authorization Time Period 10/21-12/30/21 for 10 visits  Authorization - Visit Number 1  Authorization - Number of Visits 10  Progress Note Due on Visit 20  PT Time Calculation  PT Start Time 1211  PT Stop Time 1251  PT Time Calculation (min) 40 min  PT - End of Session  Equipment Utilized During Treatment Gait belt  Activity Tolerance Patient limited by fatigue  Behavior During Therapy Flat affect     Plan - 01/26/20 1426    Clinical Impression Statement Pt returns today after DVT in LLE. Pt has been cleared by MD to resume activity as tolerated. Pt denies any pain in LLE. Pt did not meet STG's due to limited activity for past 4 weeks due to DVT.  PT NOTE:  spoke with PTA and reviewed chart; recert completed today to reflect continuing POC, based on pt missing several weeks of PT due to DVT.    Personal Factors and Comorbidities Comorbidity 3+;Other    Comorbidities See problem list; recent diagnosis of PSP    Examination-Activity Limitations Locomotion Level;Transfers;Dressing;Stairs;Stand;Toileting    Examination-Participation Restrictions Community Activity;Shop    Stability/Clinical Decision Making Evolving/Moderate complexity    Rehab Potential Good     PT Frequency 2x / week    PT Duration 6 weeks   per recert 68/34/1962   PT Treatment/Interventions ADLs/Self Care Home Management;Gait training;Functional mobility training;Therapeutic activities;Therapeutic exercise;Balance training;Neuromuscular re-education;Patient/family education    PT Next Visit Plan REcert completed; continue per updated POC    Consulted and Agree with Plan of Care Patient;Family member/caregiver    Family Member Consulted Husband           PT Short Term Goals - 01/26/20 1428      PT SHORT TERM GOAL #1   Title Pt will perform at least 4 minutes of continuous gait activity with rollator, min assist, for improved endurance for gait in the home.  TARGET 02/16/2020    Baseline 11/17//21-pt requires mod assist to amb with rollator and only able to amb 110' with standing rest break    Time 4    Period Weeks    Status On-going      PT SHORT TERM GOAL #2   Title Pt will perform 10 reps of sit<>stand with minimal assistance for improved transfer efficiency and functional strength.    Baseline 5 reps with mod assist on 01/24/20    Time 4    Period Weeks    Status On-going  PT SHORT TERM GOAL #3   Title Pt will improve TUG score to less than or equal to 60 seconds for decreased fall risk.    Baseline 53.69 sec at eval; 53.91 11/28/2019;120 sec on 01/24/20    Time 4    Period Weeks    Status Revised            PT Long Term Goals - 01/26/20 1430      PT LONG TERM GOAL #1   Title Pt/husband will verbalize and demonstrate understanding of final HEP to maintain functional gains made in therapy.  TARGET 03/01/2020    Time 6    Period Weeks    Status On-going      PT LONG TERM GOAL #2   Title Pt will perform TUG shuttle activity with rollator and min guard, at least 3 sets without break, to demonstrate improved transfer efficiency and safety.    Time 6    Period Weeks    Status On-going      PT LONG TERM GOAL #3   Title Pt will improve TUG score to less  than or equal to 45 seconds for decreased fall risk/improved mobility in the home.    Time 6    Period Weeks    Status On-going      PT LONG TERM GOAL #4   Title Pt/husband will verbalize understanding of fall prevention in home environment.    Time 6    Period Weeks    Status On-going      PT LONG TERM GOAL #5   Title Pt will improve gait velocity to at least 1.1 ft/sec for improved gait efficiency and safety in the home.    Time 6    Period Weeks    Status On-going          Mady Haagensen, [place] 01/26/20 2:31 PM Phone: [phone] Fax: [phone]

## 2020-01-25 ENCOUNTER — Telehealth: Payer: Self-pay

## 2020-01-25 ENCOUNTER — Telehealth: Payer: Self-pay | Admitting: Neurology

## 2020-01-25 NOTE — Telephone Encounter (Signed)
Called patient and let them know paperwork was ready for pickup. Paperwork was faxed and confirmed 01/25/20 at 11:45

## 2020-01-25 NOTE — Telephone Encounter (Signed)
Pt's husband, Sharion Balloon (on [place]) called she needs a prescription for lift transfer seat for the car. Would like a call from the nurse.

## 2020-01-25 NOTE — Telephone Encounter (Signed)
Called the patient's husband and advised that Dr Vickey Huger hasn't ordered these. This is usually something that would most likely be ordered by primary care. He verbalized understanding and had no further questions.

## 2020-01-26 ENCOUNTER — Other Ambulatory Visit: Payer: Self-pay

## 2020-01-26 ENCOUNTER — Ambulatory Visit: Payer: Medicare PPO | Admitting: Physical Therapy

## 2020-01-26 DIAGNOSIS — R278 Other lack of coordination: Secondary | ICD-10-CM | POA: Diagnosis not present

## 2020-01-26 DIAGNOSIS — M6281 Muscle weakness (generalized): Secondary | ICD-10-CM | POA: Diagnosis not present

## 2020-01-26 DIAGNOSIS — R2689 Other abnormalities of gait and mobility: Secondary | ICD-10-CM | POA: Diagnosis not present

## 2020-01-26 DIAGNOSIS — R41844 Frontal lobe and executive function deficit: Secondary | ICD-10-CM | POA: Diagnosis not present

## 2020-01-26 DIAGNOSIS — R41842 Visuospatial deficit: Secondary | ICD-10-CM | POA: Diagnosis not present

## 2020-01-26 DIAGNOSIS — R2681 Unsteadiness on feet: Secondary | ICD-10-CM | POA: Diagnosis not present

## 2020-01-26 DIAGNOSIS — R29818 Other symptoms and signs involving the nervous system: Secondary | ICD-10-CM | POA: Diagnosis not present

## 2020-01-26 DIAGNOSIS — R4701 Aphasia: Secondary | ICD-10-CM | POA: Diagnosis not present

## 2020-01-26 DIAGNOSIS — R41840 Attention and concentration deficit: Secondary | ICD-10-CM | POA: Diagnosis not present

## 2020-01-26 NOTE — Therapy (Signed)
[facility] [name] [place], [place], [address] Phone: [phone]   Fax:  [phone]  Physical Therapy Treatment  Patient Details  Name: Pamela Alvarez MRN: [id number] Date of Birth: [birth year] Referring Provider (PT): Dohmeier, Porfirio Mylar   Encounter Date: 01/26/2020   PT End of Session - 01/26/20 1439    Visit Number 14    Number of Visits 23   per recert 01/24/2020   Date for PT Re-Evaluation 03/07/20    Authorization Type [employer] Medicare-submitted auth at completion of eval; new auth request for 10 additional visits requested after visit 12/21/2019    Authorization Time Period 10/21-12/30/21 for 10 visits    Authorization - Visit Number 2    Authorization - Number of Visits 10    Progress Note Due on Visit 20    PT Start Time 1234    PT Stop Time 1315    PT Time Calculation (min) 41 min    Equipment Utilized During Treatment Gait belt    Activity Tolerance Patient limited by fatigue    Behavior During Therapy Flat affect           Past Medical History:  Diagnosis Date  . Cyst 07-2008   Cyst on back x2 that were drained  . H/O echocardiogram 02-2000  . Hx of colonic polyps   . Hyperlipidemia   . Hypertension   . Varicose veins of both lower extremities     Past Surgical History:  Procedure Laterality Date  . POLYPECTOMY     Colon  . TONSILLECTOMY    . TUBAL LIGATION      There were no vitals filed for this visit.   Subjective Assessment - 01/26/20 1435    Subjective Husband reports no change since last visit.  They are trying to walk at home without rollator.    Patient is accompained by: Family member   Husband-provides history   Patient Stated Goals To try to maintain as much as possible, try to prevent regression as much as possible.    Currently in Pain? No/denies                             [facility] Adult PT Treatment/Exercise - 01/26/20 0001      Transfers   Transfers  Sit to Stand;Stand to Sit    Sit to Stand 3: Mod assist    Sit to Stand Details Tactile cues for initiation;Tactile cues for sequencing;Tactile cues for placement;Verbal cues for technique;Verbal cues for precautions/safety;Manual facilitation for weight shifting;Manual facilitation for placement    Sit to Stand Details (indicate cue type and reason) Cues each time for hand placement and tactile cues to make sure to keep feet on ground.    Stand to Sit 3: Mod assist    Stand to Sit Details (indicate cue type and reason) Tactile cues for initiation;Tactile cues for placement;Manual facilitation for placement;Manual facilitation for weight shifting    Stand to Sit Details Constant verbal cues to back up until cues to reach back to sit.  Pt tries to sit too early.      Ambulation/Gait   Ambulation/Gait Yes    Ambulation/Gait Assistance 3: Mod assist;4: Min assist    Ambulation/Gait Assistance Details Pt requires therapist assist throughout to guide rollator and to not let rollator get too far in front.  PT provides cues to stop, reset posture and bring rollator closer, during gait.  Ambulation Distance (Feet) 115 Feet   x 2   Assistive device Rollator    Gait Pattern Step-through pattern;Step-to pattern;Decreased step length - right;Decreased step length - left;Decreased stride length;Decreased dorsiflexion - right;Decreased dorsiflexion - left;Trunk flexed;Poor foot clearance - left;Poor foot clearance - right;Trunk rotated posteriorly on left    Ambulation Surface Level;Indoor    Gait Comments PT has to assist with rollator management today due to decreased safety awareness, slowed processing.  Pt has one episode with stopping where she continues signifciant forward lean, needing mod assist to return to upright posture.      Exercises   Exercises Knee/Hip;Other Exercises      Knee/Hip Exercises: Seated   Long Arc Quad AROM;Strengthening;Both;1 set;10 reps;Limitations    Long Arc Quad  Limitations cues for full extension on the L    Other Seated Knee/Hip Exercises in LAQ position performed ankle pumps 2 sets x 10 each side           Seated march, 2 sets x 10, cues for "stomping" LLE onto floor 2nd set Seated on elevated mat:  forward/back knee flexion and extension, alternating legs with assist, 2 sets x 10 reps  Seated forward flexed posture (nose over toes), then return to upright posture, with verbal and visual, tactile cues, x 10 reps  Pt requires extra time and assist throughout exercise.          PT Short Term Goals - 01/26/20 1428      PT SHORT TERM GOAL #1   Title Pt will perform at least 4 minutes of continuous gait activity with rollator, min assist, for improved endurance for gait in the home.  TARGET 02/16/2020    Baseline 11/17//21-pt requires mod assist to amb with rollator and only able to amb 110' with standing rest break    Time 4    Period Weeks    Status On-going      PT SHORT TERM GOAL #2   Title Pt will perform 10 reps of sit<>stand with minimal assistance for improved transfer efficiency and functional strength.    Baseline 5 reps with mod assist on 01/24/20    Time 4    Period Weeks    Status On-going      PT SHORT TERM GOAL #3   Title Pt will improve TUG score to less than or equal to 60 seconds for decreased fall risk.    Baseline 53.69 sec at eval; 53.91 11/28/2019;120 sec on 01/24/20    Time 4    Period Weeks    Status Revised             PT Long Term Goals - 01/26/20 1430      PT LONG TERM GOAL #1   Title Pt/husband will verbalize and demonstrate understanding of final HEP to maintain functional gains made in therapy.  TARGET 03/01/2020    Time 6    Period Weeks    Status On-going      PT LONG TERM GOAL #2   Title Pt will perform TUG shuttle activity with rollator and min guard, at least 3 sets without break, to demonstrate improved transfer efficiency and safety.    Time 6    Period Weeks    Status On-going       PT LONG TERM GOAL #3   Title Pt will improve TUG score to less than or equal to 45 seconds for decreased fall risk/improved mobility in the home.    Time 6  Period Weeks    Status On-going      PT LONG TERM GOAL #4   Title Pt/husband will verbalize understanding of fall prevention in home environment.    Time 6    Period Weeks    Status On-going      PT LONG TERM GOAL #5   Title Pt will improve gait velocity to at least 1.1 ft/sec for improved gait efficiency and safety in the home.    Time 6    Period Weeks    Status On-going                 Plan - 01/26/20 1440    Clinical Impression Statement Recert completed following previous visit.  Husband reports wanting to make sure pt is continueing to stay mobile, especially if caregivers/CNAs start coming into the home to assist.  Pt with continued difficulty with rollator today, keeping it too far in front and having forward flexed posture.  PT has to provide verbal and manual cues for stopping to reset posture for optimal safety with gait.    Personal Factors and Comorbidities Comorbidity 3+;Other    Comorbidities See problem list; recent diagnosis of PSP    Examination-Activity Limitations Locomotion Level;Transfers;Dressing;Stairs;Stand;Toileting    Examination-Participation Restrictions Community Activity;Shop    Stability/Clinical Decision Making Evolving/Moderate complexity    Rehab Potential Good    PT Frequency 2x / week    PT Duration 6 weeks   per recert/including week of 01/24/2020   PT Treatment/Interventions ADLs/Self Care Home Management;Gait training;Functional mobility training;Therapeutic activities;Therapeutic exercise;Balance training;Neuromuscular re-education;Patient/family education    PT Next Visit Plan Seated exercise, standing posture exercise, standing weightshifting and exercise to work on step length/foot clearance    Consulted and Agree with Plan of Care Patient;Family member/caregiver    Family  Member Consulted Husband           Patient will benefit from skilled therapeutic intervention in order to improve the following deficits and impairments:  Abnormal gait, Difficulty walking, Decreased safety awareness, Decreased endurance, Decreased activity tolerance, Decreased balance, Decreased mobility, Decreased strength, Postural dysfunction  Visit Diagnosis: Other abnormalities of gait and mobility  Muscle weakness (generalized)     Problem List Patient Active Problem List   Diagnosis Date Noted  . Progressive supranuclear palsy ([facility]) 10/12/2019  . Encephalomalacia on imaging study 10/12/2019  . Fluency disorder associated with underlying disease 02/08/2018  . Cognitive and neurobehavioral dysfunction 02/08/2018  . 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 Arthritis of carpometacarpal [facility]) joint of left thumb 01/24/2018  . Ankle fracture, left 09/03/2016  . Hand dysfunction 03/11/2016  . Symptomatic varicose veins, bilateral 03/11/2016  . Numbness of left thumb 03/11/2016  . Elevated BP 07/27/2014  . Bunion of left foot 07/26/2014  . Abnormality of gait 07/26/2014  . Visit for preventive health examination 07/28/2013  . Right anterior knee pain 07/22/2012  . Medicare annual wellness visit, subsequent 07/25/2011  . ANXIETY, SITUATIONAL 09/25/2009  . ARTHRITIS 09/25/2009  . Vitamin D deficiency 09/27/2008  . Hyperlipidemia 09/17/2008  . Elevated blood pressure reading without diagnosis of hypertension 09/17/2008  . COLONIC POLYPS, HX OF 09/17/2008  . PERSONAL HISTORY DISEASES SKIN&SUBCUT TISSUE 09/17/2008    Brayla Pat W. 01/26/2020, 2:44 PM Gean Maidens., PT [facility] [address] [place], [place], [address] Phone: [phone]   Fax:  [phone]  Name: Shamir Sedlar MRN: [id number] Date of Birth: 1940/12/01

## 2020-01-26 NOTE — Addendum Note (Signed)
Addended by: Gean Maidens on: 01/26/2020 02:34 PM   Modules accepted: Orders

## 2020-01-30 ENCOUNTER — Other Ambulatory Visit: Payer: Self-pay

## 2020-01-30 ENCOUNTER — Ambulatory Visit: Payer: Medicare PPO | Admitting: Physical Therapy

## 2020-01-30 DIAGNOSIS — R41842 Visuospatial deficit: Secondary | ICD-10-CM | POA: Diagnosis not present

## 2020-01-30 DIAGNOSIS — R2689 Other abnormalities of gait and mobility: Secondary | ICD-10-CM

## 2020-01-30 DIAGNOSIS — R278 Other lack of coordination: Secondary | ICD-10-CM | POA: Diagnosis not present

## 2020-01-30 DIAGNOSIS — R41844 Frontal lobe and executive function deficit: Secondary | ICD-10-CM | POA: Diagnosis not present

## 2020-01-30 DIAGNOSIS — R29818 Other symptoms and signs involving the nervous system: Secondary | ICD-10-CM | POA: Diagnosis not present

## 2020-01-30 DIAGNOSIS — M6281 Muscle weakness (generalized): Secondary | ICD-10-CM

## 2020-01-30 DIAGNOSIS — R41840 Attention and concentration deficit: Secondary | ICD-10-CM | POA: Diagnosis not present

## 2020-01-30 DIAGNOSIS — R4701 Aphasia: Secondary | ICD-10-CM | POA: Diagnosis not present

## 2020-01-30 DIAGNOSIS — R2681 Unsteadiness on feet: Secondary | ICD-10-CM | POA: Diagnosis not present

## 2020-01-30 NOTE — Therapy (Signed)
[facility] [address] [place], [place], [address] Phone: [phone]   Fax:  [phone]  Physical Therapy Treatment  Patient Details  Name: Pamela Alvarez MRN: [id number] Date of Birth: [birth year] Referring Provider (PT): Dohmeier, Porfirio Mylar   Encounter Date: 01/30/2020   PT End of Session - 01/30/20 1116    Visit Number 15    Number of Visits 23   per recert 01/24/2020   Date for PT Re-Evaluation 03/07/20    Authorization Type [employer] Medicare-submitted auth at completion of eval; new auth request for 10 additional visits requested after visit 12/21/2019    Authorization Time Period 10/21-12/30/21 for 10 visits    Authorization - Visit Number 3    Authorization - Number of Visits 10    Progress Note Due on Visit 20    PT Start Time 1017    PT Stop Time 1102    PT Time Calculation (min) 45 min    Equipment Utilized During Treatment Gait belt    Behavior During Therapy Flat affect           Past Medical History:  Diagnosis Date  . Cyst 07-2008   Cyst on back x2 that were drained  . H/O echocardiogram 02-2000  . Hx of colonic polyps   . Hyperlipidemia   . Hypertension   . Varicose veins of both lower extremities     Past Surgical History:  Procedure Laterality Date  . POLYPECTOMY     Colon  . TONSILLECTOMY    . TUBAL LIGATION      There were no vitals filed for this visit.   Subjective Assessment - 01/30/20 1023    Subjective No changes since last visit.    Patient is accompained by: Family member   Husband-provides history   Patient Stated Goals To try to maintain as much as possible, try to prevent regression as much as possible.    Currently in Pain? No/denies                             [facility] Adult PT Treatment/Exercise - 01/30/20 0001      Transfers   Transfers Sit to Stand;Stand to Sit    Sit to Stand 4: Min assist;From bed;With upper extremity assist    Sit to Stand  Details Tactile cues for initiation;Tactile cues for sequencing;Tactile cues for placement;Verbal cues for technique;Verbal cues for precautions/safety;Manual facilitation for weight shifting;Manual facilitation for placement    Sit to Stand Details (indicate cue type and reason) Cues each time for hand placement on mat    Stand to Sit 4: Min assist;3: Mod assist;To bed    Stand to Sit Details (indicate cue type and reason) Tactile cues for initiation;Tactile cues for placement;Manual facilitation for placement;Manual facilitation for weight shifting    Stand to Sit Details Constant verbal cues to back up until cues to stop and sit.      Ambulation/Gait   Ambulation/Gait Yes    Ambulation/Gait Assistance 4: Min assist;3: Mod assist    Ambulation/Gait Assistance Details Occasional mod assist to bring rollator closer towards body; reminder cues for upright posture and to look ahead    Ambulation Distance (Feet) 115 Feet   x 2   Assistive device Rollator    Gait Pattern Step-through pattern;Step-to pattern;Decreased step length - right;Decreased step length - left;Decreased stride length;Decreased dorsiflexion - right;Decreased dorsiflexion - left;Trunk flexed;Poor foot clearance - left;Poor foot clearance - right;Trunk  rotated posteriorly on left    Ambulation Surface Level;Indoor    Pre-Gait Activities Standing at locked rollator, with relaxed>upright posture x 5 reps; standing in place with head turns x 5, head nods x 5; Step taps to balance disks forward, x 10 reps 2 sets.      Exercises   Exercises Knee/Hip;Other Exercises    Other Exercises  Forward lean to upright posture (PT provides tactile cues for increased forward lean) x 10 reps.  Pt holding boomwhacker:  forward lean>upright posture, then trunk rotation holding boomwhacker, side to side, return to midline each time x 5 reps.  Holding BoomWhacker with 1 hand, reach across body to mat, x 5 reps with assistance to increase trunk rotation  range.        Knee/Hip Exercises: Seated   Long Arc Quad AROM;Strengthening;Both;10 reps;Limitations;2 sets    Con-way Limitations cues for full extension on the L    Other Seated Knee/Hip Exercises in LAQ position performed ankle pumps 10 each side.  Additional seated position 10 reps ankle pumps    Marching AROM;Strengthening;Both;2 sets;10 reps;Limitations    Marching Limitations cues for increased ROM on the L side                    PT Short Term Goals - 01/26/20 1428      PT SHORT TERM GOAL #1   Title Pt will perform at least 4 minutes of continuous gait activity with rollator, min assist, for improved endurance for gait in the home.  TARGET 02/16/2020    Baseline 11/17//21-pt requires mod assist to amb with rollator and only able to amb 110' with standing rest break    Time 4    Period Weeks    Status On-going      PT SHORT TERM GOAL #2   Title Pt will perform 10 reps of sit<>stand with minimal assistance for improved transfer efficiency and functional strength.    Baseline 5 reps with mod assist on 01/24/20    Time 4    Period Weeks    Status On-going      PT SHORT TERM GOAL #3   Title Pt will improve TUG score to less than or equal to 60 seconds for decreased fall risk.    Baseline 53.69 sec at eval; 53.91 11/28/2019;120 sec on 01/24/20    Time 4    Period Weeks    Status Revised             PT Long Term Goals - 01/26/20 1430      PT LONG TERM GOAL #1   Title Pt/husband will verbalize and demonstrate understanding of final HEP to maintain functional gains made in therapy.  TARGET 03/01/2020    Time 6    Period Weeks    Status On-going      PT LONG TERM GOAL #2   Title Pt will perform TUG shuttle activity with rollator and min guard, at least 3 sets without break, to demonstrate improved transfer efficiency and safety.    Time 6    Period Weeks    Status On-going      PT LONG TERM GOAL #3   Title Pt will improve TUG score to less than or  equal to 45 seconds for decreased fall risk/improved mobility in the home.    Time 6    Period Weeks    Status On-going      PT LONG TERM GOAL #4   Title  Pt/husband will verbalize understanding of fall prevention in home environment.    Time 6    Period Weeks    Status On-going      PT LONG TERM GOAL #5   Title Pt will improve gait velocity to at least 1.1 ft/sec for improved gait efficiency and safety in the home.    Time 6    Period Weeks    Status On-going                 Plan - 01/30/20 1117    Clinical Impression Statement Pt more alert today with less fatigue noted.  Pt needs less assistance in general with gait today, but therapist still present assisting with positioning of walker to not let it get too far ahead of patient.  Also, PT provides cues and brief stopping break for resetting posture during gait.  Pt will continue to benefit from skilled PT to work towards goals for improved strength, balance, gait.    Personal Factors and Comorbidities Comorbidity 3+;Other    Comorbidities See problem list; recent diagnosis of PSP    Examination-Activity Limitations Locomotion Level;Transfers;Dressing;Stairs;Stand;Toileting    Examination-Participation Restrictions Community Activity;Shop    Stability/Clinical Decision Making Evolving/Moderate complexity    Rehab Potential Good    PT Frequency 2x / week    PT Duration 6 weeks   per recert/including week of 01/24/2020   PT Treatment/Interventions ADLs/Self Care Home Management;Gait training;Functional mobility training;Therapeutic activities;Therapeutic exercise;Balance training;Neuromuscular re-education;Patient/family education    PT Next Visit Plan Continue seated exercise, seated and standing posture exercise, standing weightshifting and exercise to work on step length/foot clearance    Consulted and Agree with Plan of Care Patient;Family member/caregiver    Family Member Consulted Husband           Patient will  benefit from skilled therapeutic intervention in order to improve the following deficits and impairments:  Abnormal gait, Difficulty walking, Decreased safety awareness, Decreased endurance, Decreased activity tolerance, Decreased balance, Decreased mobility, Decreased strength, Postural dysfunction  Visit Diagnosis: Muscle weakness (generalized)  Unsteadiness on feet  Other abnormalities of gait and mobility     Problem List Patient Active Problem List   Diagnosis Date Noted  . Progressive supranuclear palsy (HCC) 10/12/2019  . Encephalomalacia on imaging study 10/12/2019  . Fluency disorder associated with underlying disease 02/08/2018  . Cognitive and neurobehavioral dysfunction 02/08/2018  . 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 Arthritis of carpometacarpal [facility]) joint of left thumb 01/24/2018  . Ankle fracture, left 09/03/2016  . Hand dysfunction 03/11/2016  . Symptomatic varicose veins, bilateral 03/11/2016  . Numbness of left thumb 03/11/2016  . Elevated BP 07/27/2014  . Bunion of left foot 07/26/2014  . Abnormality of gait 07/26/2014  . Visit for preventive health examination 07/28/2013  . Right anterior knee pain 07/22/2012  . Medicare annual wellness visit, subsequent 07/25/2011  . ANXIETY, SITUATIONAL 09/25/2009  . ARTHRITIS 09/25/2009  . Vitamin D deficiency 09/27/2008  . Hyperlipidemia 09/17/2008  . Elevated blood pressure reading without diagnosis of hypertension 09/17/2008  . COLONIC POLYPS, HX OF 09/17/2008  . PERSONAL HISTORY DISEASES SKIN&SUBCUT TISSUE 09/17/2008    Christalynn Boise W. 01/30/2020, 11:21 AM Gean Maidens., PT  [facility] [address] [place], [place], [address] Phone: [phone]   Fax:  [phone]  Name: Pamela Alvarez MRN: [id number] Date of Birth: 1940/11/23

## 2020-02-05 ENCOUNTER — Other Ambulatory Visit: Payer: Self-pay

## 2020-02-05 ENCOUNTER — Ambulatory Visit: Payer: Medicare PPO | Admitting: Physical Therapy

## 2020-02-05 DIAGNOSIS — R41844 Frontal lobe and executive function deficit: Secondary | ICD-10-CM | POA: Diagnosis not present

## 2020-02-05 DIAGNOSIS — R2681 Unsteadiness on feet: Secondary | ICD-10-CM

## 2020-02-05 DIAGNOSIS — M6281 Muscle weakness (generalized): Secondary | ICD-10-CM | POA: Diagnosis not present

## 2020-02-05 DIAGNOSIS — R29818 Other symptoms and signs involving the nervous system: Secondary | ICD-10-CM | POA: Diagnosis not present

## 2020-02-05 DIAGNOSIS — R41840 Attention and concentration deficit: Secondary | ICD-10-CM | POA: Diagnosis not present

## 2020-02-05 DIAGNOSIS — R2689 Other abnormalities of gait and mobility: Secondary | ICD-10-CM

## 2020-02-05 DIAGNOSIS — R278 Other lack of coordination: Secondary | ICD-10-CM | POA: Diagnosis not present

## 2020-02-05 DIAGNOSIS — R41842 Visuospatial deficit: Secondary | ICD-10-CM | POA: Diagnosis not present

## 2020-02-05 DIAGNOSIS — R4701 Aphasia: Secondary | ICD-10-CM | POA: Diagnosis not present

## 2020-02-05 NOTE — Therapy (Signed)
[facility] [address] [place], [place], [address] Phone: [phone]   Fax:  [phone]  Physical Therapy Treatment  Patient Details  Name: Pamela Alvarez MRN: [id number] Date of Birth: 09-Dec-1940 Referring Provider (PT): Dohmeier, Porfirio Mylar   Encounter Date: 02/05/2020   PT End of Session - 02/05/20 1915    Visit Number 16    Number of Visits 23   per recert 01/24/2020   Date for PT Re-Evaluation 03/07/20    Authorization Type [employer] Medicare-submitted auth at completion of eval; new auth request for 10 additional visits requested after visit 12/21/2019    Authorization Time Period 10/21-12/30/21 for 10 visits    Authorization - Visit Number 4    Authorization - Number of Visits 10    Progress Note Due on Visit 20    PT Start Time 1018    PT Stop Time 1058    PT Time Calculation (min) 40 min    Equipment Utilized During Treatment Gait belt    Activity Tolerance Patient limited by fatigue    Behavior During Therapy Flat affect           Past Medical History:  Diagnosis Date  . Cyst 07-2008   Cyst on back x2 that were drained  . H/O echocardiogram 02-2000  . Hx of colonic polyps   . Hyperlipidemia   . Hypertension   . Varicose veins of both lower extremities     Past Surgical History:  Procedure Laterality Date  . POLYPECTOMY     Colon  . TONSILLECTOMY    . TUBAL LIGATION      There were no vitals filed for this visit.   Subjective Assessment - 02/05/20 1021    Subjective Per husband:  Micah Flesher multiple places with daughter over the holiday.  Made me realize that I needed the additional help for equipment things.    Patient is accompained by: Family member   Husband-provides history   Patient Stated Goals To try to maintain as much as possible, try to prevent regression as much as possible.    Currently in Pain? No/denies                             [facility] Adult PT Treatment/Exercise  - 02/05/20 0001      Transfers   Transfers Sit to Stand;Stand to Sit    Sit to Stand 4: Min assist;From bed;With upper extremity assist    Sit to Stand Details Tactile cues for initiation;Tactile cues for sequencing;Tactile cues for placement;Verbal cues for technique;Verbal cues for precautions/safety;Manual facilitation for weight shifting;Manual facilitation for placement    Sit to Stand Details (indicate cue type and reason) Cues each time for hand placement    Stand to Sit 4: Min assist;3: Mod assist;To bed    Stand to Sit Details (indicate cue type and reason) Tactile cues for initiation;Tactile cues for placement;Manual facilitation for placement;Manual facilitation for weight shifting    Stand to Sit Details Constant verbal cues to back up and sit.    Comments With turning to sit using rollator after bout of gait, pt attempts x 2 to sit, requiring max assist to return to stand and continue backin up to mat surface.      Ambulation/Gait   Ambulation/Gait Yes    Ambulation/Gait Assistance 4: Min assist;3: Mod assist    Ambulation/Gait Assistance Details PT requests pt take 2 standing breaks during gait, to bring walker  closer and to return to upright posture.    Ambulation Distance (Feet) 115 Feet    Assistive device Rollator    Gait Pattern Step-through pattern;Step-to pattern;Decreased step length - right;Decreased step length - left;Decreased stride length;Decreased dorsiflexion - right;Decreased dorsiflexion - left;Trunk flexed;Poor foot clearance - left;Poor foot clearance - right;Trunk rotated posteriorly on left    Ambulation Surface Level;Indoor    Pre-Gait Activities Standing at locked rollator:  marching in place x 10 reps each leg, then forward step taps x 10 reps.      Exercises   Exercises Knee/Hip;Other Exercises    Other Exercises  Forward lean to upright posture (PT provides tactile cues for increased forward lean) x 10 reps.  Pt holding boomwhacker:  forward  lean>upright posture, 10 reps.  Attempted pt holding boomwhacker in each hand, with alt UE/leg lift, with therapist assist to coordinate which arm to lift, x 5 reps each side.      Knee/Hip Exercises: Seated   Long Arc Quad AROM;Strengthening;Both;10 reps;Limitations;2 sets    [employer] Limitations cues for full extension on the L    Other Seated Knee/Hip Exercises in LAQ position performed ankle pumps 10 each side.  Additional seated position 10 reps ankle pumps x 2 sets     Marching AROM;Strengthening;Both;2 sets;10 reps;Limitations    Marching Limitations cues for increased ROM on the L side                    PT Short Term Goals - 01/26/20 1428      PT SHORT TERM GOAL #1   Title Pt will perform at least 4 minutes of continuous gait activity with rollator, min assist, for improved endurance for gait in the home.  TARGET 02/16/2020    Baseline 11/17//21-pt requires mod assist to amb with rollator and only able to amb 110' with standing rest break    Time 4    Period Weeks    Status On-going      PT SHORT TERM GOAL #2   Title Pt will perform 10 reps of sit<>stand with minimal assistance for improved transfer efficiency and functional strength.    Baseline 5 reps with mod assist on 01/24/20    Time 4    Period Weeks    Status On-going      PT SHORT TERM GOAL #3   Title Pt will improve TUG score to less than or equal to 60 seconds for decreased fall risk.    Baseline 53.69 sec at eval; 53.91 11/28/2019;120 sec on 01/24/20    Time 4    Period Weeks    Status Revised             PT Long Term Goals - 01/26/20 1430      PT LONG TERM GOAL #1   Title Pt/husband will verbalize and demonstrate understanding of final HEP to maintain functional gains made in therapy.  TARGET 03/01/2020    Time 6    Period Weeks    Status On-going      PT LONG TERM GOAL #2   Title Pt will perform TUG shuttle activity with rollator and min guard, at least 3 sets without break, to  demonstrate improved transfer efficiency and safety.    Time 6    Period Weeks    Status On-going      PT LONG TERM GOAL #3   Title Pt will improve TUG score to less than or equal to 45 seconds for decreased  fall risk/improved mobility in the home.    Time 6    Period Weeks    Status On-going      PT LONG TERM GOAL #4   Title Pt/husband will verbalize understanding of fall prevention in home environment.    Time 6    Period Weeks    Status On-going      PT LONG TERM GOAL #5   Title Pt will improve gait velocity to at least 1.1 ft/sec for improved gait efficiency and safety in the home.    Time 6    Period Weeks    Status On-going                 Plan - 02/05/20 1916    Clinical Impression Statement Pt appears fatigued today during session, with husband reporting several outings with family in town over the weekend.  He asks questions regarding possibility of power wheelchair for mobility; encouraged him to speak to Dr. Vickey Hugerohmeier at next visit.  Pt needs frequent cues for posture with sitting and standing exercises.  PT requests pt take 2 standing breaks during gait to keep rollator close; without assist of therapist, she would not be safe to use rollator.    Personal Factors and Comorbidities Comorbidity 3+;Other    Comorbidities See problem list; recent diagnosis of PSP    Examination-Activity Limitations Locomotion Level;Transfers;Dressing;Stairs;Stand;Toileting    Examination-Participation Restrictions Community Activity;Shop    Stability/Clinical Decision Making Evolving/Moderate complexity    Rehab Potential Good    PT Frequency 2x / week    PT Duration 6 weeks   per recert/including week of 01/24/2020   PT Treatment/Interventions ADLs/Self Care Home Management;Gait training;Functional mobility training;Therapeutic activities;Therapeutic exercise;Balance training;Neuromuscular re-education;Patient/family education    PT Next Visit Plan Continue seated exercise, seated  and standing posture exercise, standing weightshifting and exercise to work on step length/foot clearance    Consulted and Agree with Plan of Care Patient;Family member/caregiver    Family Member Consulted Husband           Patient will benefit from skilled therapeutic intervention in order to improve the following deficits and impairments:  Abnormal gait, Difficulty walking, Decreased safety awareness, Decreased endurance, Decreased activity tolerance, Decreased balance, Decreased mobility, Decreased strength, Postural dysfunction  Visit Diagnosis: Muscle weakness (generalized)  Unsteadiness on feet  Other abnormalities of gait and mobility     Problem List Patient Active Problem List   Diagnosis Date Noted  . Progressive supranuclear palsy (HCC) 10/12/2019  . Encephalomalacia on imaging study 10/12/2019  . Fluency disorder associated with underlying disease 02/08/2018  . Cognitive and neurobehavioral dysfunction 02/08/2018  . 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 Arthritis of carpometacarpal [facility] joint of left thumb 01/24/2018  . Ankle fracture, left 09/03/2016  . Hand dysfunction 03/11/2016  . Symptomatic varicose veins, bilateral 03/11/2016  . Numbness of left thumb 03/11/2016  . Elevated BP 07/27/2014  . Bunion of left foot 07/26/2014  . Abnormality of gait 07/26/2014  . Visit for preventive health examination 07/28/2013  . Right anterior knee pain 07/22/2012  . Medicare annual wellness visit, subsequent 07/25/2011  . ANXIETY, SITUATIONAL 09/25/2009  . ARTHRITIS 09/25/2009  . Vitamin D deficiency 09/27/2008  . Hyperlipidemia 09/17/2008  . Elevated blood pressure reading without diagnosis of hypertension 09/17/2008  . COLONIC POLYPS, HX OF 09/17/2008  . PERSONAL HISTORY DISEASES SKIN&SUBCUT TISSUE 09/17/2008    Alesa Echevarria W. 02/05/2020, 7:19 PM  Gean MaidensMARRIOTT,Bernese Doffing W., PT   [facility] [address]  [address] [place], [place], [address] Phone: [phone]   Fax:  [phone]  Name: Pamela Alvarez MRN: [id number] Date of Birth: [birth year]

## 2020-02-06 DIAGNOSIS — G231 Progressive supranuclear ophthalmoplegia [Steele-Richardson-Olszewski]: Secondary | ICD-10-CM

## 2020-02-07 NOTE — Telephone Encounter (Signed)
Signed  Order   On  Desk

## 2020-02-08 ENCOUNTER — Ambulatory Visit: Payer: Medicare PPO | Attending: Neurology | Admitting: Physical Therapy

## 2020-02-08 ENCOUNTER — Other Ambulatory Visit: Payer: Self-pay

## 2020-02-08 DIAGNOSIS — R29818 Other symptoms and signs involving the nervous system: Secondary | ICD-10-CM | POA: Insufficient documentation

## 2020-02-08 DIAGNOSIS — M6281 Muscle weakness (generalized): Secondary | ICD-10-CM | POA: Insufficient documentation

## 2020-02-08 DIAGNOSIS — R482 Apraxia: Secondary | ICD-10-CM | POA: Diagnosis not present

## 2020-02-08 DIAGNOSIS — R41841 Cognitive communication deficit: Secondary | ICD-10-CM | POA: Insufficient documentation

## 2020-02-08 DIAGNOSIS — R2681 Unsteadiness on feet: Secondary | ICD-10-CM | POA: Diagnosis not present

## 2020-02-08 DIAGNOSIS — R4701 Aphasia: Secondary | ICD-10-CM | POA: Insufficient documentation

## 2020-02-08 DIAGNOSIS — R2689 Other abnormalities of gait and mobility: Secondary | ICD-10-CM | POA: Insufficient documentation

## 2020-02-08 NOTE — Therapy (Signed)
[facility] [address] [place], [place], [address] Phone: [phone]   Fax:  [phone]  Physical Therapy Treatment  Patient Details  Name: Pamela Alvarez MRN: [id number] Date of Birth: [birth year] Referring Provider (PT): Dohmeier, Porfirio Mylar   Encounter Date: 02/08/2020   PT End of Session - 02/08/20 1537    Visit Number 17    Number of Visits 23   per recert 01/24/2020   Date for PT Re-Evaluation 03/07/20    Authorization Type [employer]-submitted auth at completion of eval; new auth request for 10 additional visits requested after visit 12/21/2019    Authorization Time Period 10/21-12/30/21 for 10 visits    Authorization - Visit Number 5    Authorization - Number of Visits 10    Progress Note Due on Visit 20    PT Start Time 1100    PT Stop Time 1145    PT Time Calculation (min) 45 min    Equipment Utilized During Treatment Gait belt    Activity Tolerance Patient tolerated treatment well    Behavior During Therapy Flat affect;WFL for tasks assessed/performed           Past Medical History:  Diagnosis Date   Cyst 07-2008   Cyst on back x2 that were drained   H/O echocardiogram 02-2000   Hx of colonic polyps    Hyperlipidemia    Hypertension    Varicose veins of both lower extremities     Past Surgical History:  Procedure Laterality Date   POLYPECTOMY     Colon   TONSILLECTOMY     TUBAL LIGATION      There were no vitals filed for this visit.   Subjective Assessment - 02/08/20 1103    Subjective No changes.  Husband feels like her L leg is moving better today.  He states he may ask Dr. Vickey Huger about a power wheelchair for Pamela Alvarez, at their upcoming visit next weeil    Patient is accompained by: Family member   Husband-provides history   Patient Stated Goals To try to maintain as much as possible, try to prevent regression as much as possible.    Currently in Pain? No/denies                              [facility] Adult PT Treatment/Exercise - 02/08/20 0001      Transfers   Transfers Sit to Stand;Stand to Sit    Sit to Stand 4: Min assist;From bed;With upper extremity assist;From chair/3-in-1    Sit to Stand Details Tactile cues for initiation;Tactile cues for sequencing;Tactile cues for placement;Verbal cues for technique;Verbal cues for precautions/safety;Manual facilitation for weight shifting;Manual facilitation for placement    Sit to Stand Details (indicate cue type and reason) Cues for hand placement    Stand to Sit 4: Min assist;3: Mod assist;To bed    Stand to Sit Details (indicate cue type and reason) Tactile cues for initiation;Tactile cues for placement;Manual facilitation for placement;Manual facilitation for weight shifting    Stand to Sit Details Constant cues for safety with stand>sit      Ambulation/Gait   Ambulation/Gait Yes    Ambulation/Gait Assistance 4: Min assist;3: Mod assist    Ambulation/Gait Assistance Details HHA today, with PT in front of patient, assisting with weightshift x 15 ft (husband assists from mat>parallel bars).      Assistive device 1 person hand held assist    Gait Pattern Step-through  pattern;Step-to pattern;Decreased step length - right;Decreased step length - left;Decreased stride length;Decreased dorsiflexion - right;Decreased dorsiflexion - left;Trunk flexed;Poor foot clearance - left;Poor foot clearance - right;Trunk rotated posteriorly on left    Ambulation Surface Level;Indoor    Gait Comments Forward/back walking in parallel bars with therapist cues for increased step length for more consistent step through pattern      High Level Balance   High Level Balance Activities Other (comment)    High Level Balance Comments Standing in parallel bars, wide BOS lateral weightshifting with tactile cues at hips, 2 sets x 10 reps.  Marching in place, using [employer]omwhacker as tactile cues for increased lifting of leg, 2 sets x  10 reps.  Forward kicks to target x 10 reps.  Step taps to black flat obstalcle x 10 reps, 2 sets; stepping over obstacle, 10 reps with cues for increased step length.  Heel raises x 20 reps bilateral, alternating heel raises x 10 reps.      Knee/Hip Exercises: Seated   Long Arc Quad AROM;Strengthening;Both;10 reps;Limitations;2 sets    Con-wayLong Arc Quad Limitations Pt with increased ease of knee extension LLE today    Other Seated Knee/Hip Exercises seated ankle pumps x 10 reps, 2 sets    Marching AROM;Strengthening;Both;2 sets;10 reps;Limitations    Marching Limitations increased ease of motion LLE today                    PT Short Term Goals - 01/26/20 1428      PT SHORT TERM GOAL #1   Title Pt will perform at least 4 minutes of continuous gait activity with rollator, min assist, for improved endurance for gait in the home.  TARGET 02/16/2020    Baseline 11/17//21-pt requires mod assist to amb with rollator and only able to amb 110' with standing rest break    Time 4    Period Weeks    Status On-going      PT SHORT TERM GOAL #2   Title Pt will perform 10 reps of sit<>stand with minimal assistance for improved transfer efficiency and functional strength.    Baseline 5 reps with mod assist on 01/24/20    Time 4    Period Weeks    Status On-going      PT SHORT TERM GOAL #3   Title Pt will improve TUG score to less than or equal to 60 seconds for decreased fall risk.    Baseline 53.69 sec at eval; 53.91 11/28/2019;120 sec on 01/24/20    Time 4    Period Weeks    Status Revised             PT Long Term Goals - 01/26/20 1430      PT LONG TERM GOAL #1   Title Pt/husband will verbalize and demonstrate understanding of final HEP to maintain functional gains made in therapy.  TARGET 03/01/2020    Time 6    Period Weeks    Status On-going      PT LONG TERM GOAL #2   Title Pt will perform TUG shuttle activity with rollator and min guard, at least 3 sets without break, to  demonstrate improved transfer efficiency and safety.    Time 6    Period Weeks    Status On-going      PT LONG TERM GOAL #3   Title Pt will improve TUG score to less than or equal to 45 seconds for decreased fall risk/improved mobility in the home.  Time 6    Period Weeks    Status On-going      PT LONG TERM GOAL #4   Title Pt/husband will verbalize understanding of fall prevention in home environment.    Time 6    Period Weeks    Status On-going      PT LONG TERM GOAL #5   Title Pt will improve gait velocity to at least 1.1 ft/sec for improved gait efficiency and safety in the home.    Time 6    Period Weeks    Status On-going                 Plan - 02/08/20 1538    Clinical Impression Statement With seated and standing exercises, pt appears to have improved ease of movement in LLE today; however, with short distance gait practice in and out of parallel bars, little carryover noted with improved step length on L.  She tolerated more standing exercises than typical today, with only 2 rest breaks.  She will continue to benefit from further skilled PT to work towards improved strength, blaance, safety with gait.    Personal Factors and Comorbidities Comorbidity 3+;Other    Comorbidities See problem list; recent diagnosis of PSP    Examination-Activity Limitations Locomotion Level;Transfers;Dressing;Stairs;Stand;Toileting    Examination-Participation Restrictions Community Activity;Shop    Stability/Clinical Decision Making Evolving/Moderate complexity    Rehab Potential Good    PT Frequency 2x / week    PT Duration 6 weeks   per recert/including week of 01/24/2020   PT Treatment/Interventions ADLs/Self Care Home Management;Gait training;Functional mobility training;Therapeutic activities;Therapeutic exercise;Balance training;Neuromuscular re-education;Patient/family education    PT Next Visit Plan Continue seated exercise, seated and standing exercise, standing  weightshifting and exercise to work on step length/foot clearance (follow up with husband about order for power w/c)    Consulted and Agree with Plan of Care Patient;Family member/caregiver    Family Member Consulted Husband           Patient will benefit from skilled therapeutic intervention in order to improve the following deficits and impairments:  Abnormal gait, Difficulty walking, Decreased safety awareness, Decreased endurance, Decreased activity tolerance, Decreased balance, Decreased mobility, Decreased strength, Postural dysfunction  Visit Diagnosis: Unsteadiness on feet  Muscle weakness (generalized)  Other abnormalities of gait and mobility     Problem List Patient Active Problem List   Diagnosis Date Noted   Progressive supranuclear palsy (HCC) 10/12/2019   Encephalomalacia on imaging study 10/12/2019   Fluency disorder associated with underlying disease 02/08/2018   Cognitive and neurobehavioral dysfunction 02/08/2018   Supranuclear ocular palsy 02/08/2018   Arthritis of carpometacarpal (CMC) joint of left thumb 01/24/2018   Ankle fracture, left 09/03/2016   Hand dysfunction 03/11/2016   Symptomatic varicose veins, bilateral 03/11/2016   Numbness of left thumb 03/11/2016   Elevated BP 07/27/2014   Bunion of left foot 07/26/2014   Abnormality of gait 07/26/2014   Visit for preventive health examination 07/28/2013   Right anterior knee pain 07/22/2012   Medicare annual wellness visit, subsequent 07/25/2011   ANXIETY, SITUATIONAL 09/25/2009   ARTHRITIS 09/25/2009   Vitamin D deficiency 09/27/2008   Hyperlipidemia 09/17/2008   Elevated blood pressure reading without diagnosis of hypertension 09/17/2008   COLONIC POLYPS, HX OF 09/17/2008   PERSONAL HISTORY DISEASES SKIN&SUBCUT TISSUE 09/17/2008    Carnell Beavers W. 02/08/2020, 3:41 PM  Gean Maidens., PT   [place] [facility] [address] [place], [place], [address] Phone: [phone]  Fax:  [phone]  Name: Pamela Alvarez MRN: [id number] Date of Birth: 11-07-1940

## 2020-02-12 ENCOUNTER — Ambulatory Visit: Payer: Medicare PPO | Admitting: Neurology

## 2020-02-12 ENCOUNTER — Other Ambulatory Visit: Payer: Self-pay

## 2020-02-12 ENCOUNTER — Other Ambulatory Visit: Payer: Self-pay | Admitting: Neurology

## 2020-02-12 ENCOUNTER — Encounter: Payer: Self-pay | Admitting: Neurology

## 2020-02-12 ENCOUNTER — Ambulatory Visit: Payer: Medicare PPO | Admitting: Physical Therapy

## 2020-02-12 VITALS — BP 116/71 | HR 91 | Ht 62.0 in | Wt 149.0 lb

## 2020-02-12 DIAGNOSIS — R262 Difficulty in walking, not elsewhere classified: Secondary | ICD-10-CM | POA: Diagnosis not present

## 2020-02-12 DIAGNOSIS — R2689 Other abnormalities of gait and mobility: Secondary | ICD-10-CM | POA: Diagnosis not present

## 2020-02-12 DIAGNOSIS — R41841 Cognitive communication deficit: Secondary | ICD-10-CM | POA: Diagnosis not present

## 2020-02-12 DIAGNOSIS — R4782 Fluency disorder in conditions classified elsewhere: Secondary | ICD-10-CM | POA: Diagnosis not present

## 2020-02-12 DIAGNOSIS — M6281 Muscle weakness (generalized): Secondary | ICD-10-CM | POA: Diagnosis not present

## 2020-02-12 DIAGNOSIS — R2681 Unsteadiness on feet: Secondary | ICD-10-CM

## 2020-02-12 DIAGNOSIS — G231 Progressive supranuclear ophthalmoplegia [Steele-Richardson-Olszewski]: Secondary | ICD-10-CM

## 2020-02-12 DIAGNOSIS — R4189 Other symptoms and signs involving cognitive functions and awareness: Secondary | ICD-10-CM

## 2020-02-12 DIAGNOSIS — R29818 Other symptoms and signs involving the nervous system: Secondary | ICD-10-CM | POA: Diagnosis not present

## 2020-02-12 DIAGNOSIS — R269 Unspecified abnormalities of gait and mobility: Secondary | ICD-10-CM

## 2020-02-12 DIAGNOSIS — R4701 Aphasia: Secondary | ICD-10-CM | POA: Diagnosis not present

## 2020-02-12 DIAGNOSIS — R482 Apraxia: Secondary | ICD-10-CM | POA: Diagnosis not present

## 2020-02-12 NOTE — Progress Notes (Signed)
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 female patient, and seen here in a Rv for memory loss-  follow up for medication management since starting the sinemet. pt and husband indicated they notice no difference with taking the medication. < than 6 mths since last memory-8/5- MMSE 9. pt continues to work with therapy PT and ST.       Other husband was able to get chair needed to assist with getting pt in/out car. husband asking about electric wheelchair. PCP may also be able to order this for the patient.   Rv on 02-12-2020: The patient has progressed, is left with very limited mobility, right hand is still " of use", and left is not. There is a discussion about an electric wheelchair, if that is self directed how will she use it, direct it? There is little sense in repeating MMSE. Sinemett has made no difference in Mr. and Mrs Pamela Alvarez's opinion.  They are content, she is much less frustrated when using the tablet. Aphasia is a cause of great difficulties in communication. DVT in October , now on Eliquis.     Progressing quickly over 30 month by history and in comparison to previous MMSE/ MOCA.  mini-mental status exam tof day ended in 9 points.  Severe dementia.  The couple visited their children and grandchildren in June , by road trip- 3 weeks. Staying in hotels /motels.  Her face is masked. She can hold small- talk. Now in a wheelchair. Left sided weakness, at home PT has noted increased rigidity, left arm clumsy and left foot everted. .  04-2018 Mr. Pamela Alvarez reports that their children have had Covid 19 in [place] and in [place]. There is also family in [facility] that wanted to visit , but the Pamela Alvarez declined. The profile today Mini-Mental Status Examination in which the patient scored 15 points.  She presented with a slightly elevated blood pressure today to the office.  Since  August she had completed physical therapy and speech therapy which has helped with some strengthening.  And prior to Christmas she had completed her physical therapy.  But after physical therapy sees she has also began again to have some ambulation difficulties.  It seems that we need an ongoing may be group exercise for her and I wonder if there could be a Zoom or WebEx video that would help to encourage her to do some of these exercises in the home environment and with some guidance and perhaps even with correction of posture or other input.  She is doing Pilates on Zoom, but its very difficult for hr to follow instructions. Patient was seen here on 02-08-2018 in a referral from Dr. Fabian Alvarez for a "second opinion " in the work up for memory loss. The patient reports having been stumped by the previously see neurologist in [place], who reportedly diagnosed her with Alzheimer's Dementia. 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 She has a [name] and reports she had some learning disability - not learning or memory related, but an inability to calculate.   Dr. Early Alvarez had always been acute aware of her learning abilities, but over the last couple of years there has been evidence of a declining cognitive ability.  The patient broke her ankle at the time and while she recovered from  this supposedly only bony injury there were several changes, she felt always cold it was as if her enough thermostat had changed, she also noted and memory decline, may be a personality change, she had trouble retrieving words and her vocabulary became progressively restricted, her speech poor. She has ever since gait and balance impairment. She is in PT twice weekly.   Family history - mother had PD.father had CAD, died at 56 years old, cancer.  Social history- [name], no history of tobacco , ETOH- used to have one glass of wine with dinner, now 2-3 days a week.  No  recreatinonal drugs. 2 sons , aged  21 and 76.    RV 04-12-2018,  Plan: Eye-movement abnormalities now most like associated with the gliosis and encephalomalcia and not neurodegeneratve. Vascular dementia.  Slowed EEG,  Abnormal gliosis or encephalomalacia in MRI - could this be an AVM? Fall related head trauma? Carotid vaso-vascular embolism?   Need MRA brain, non contrast.  Need carotid doppler first. PT, OT and ST to continue.  Rv in 2-3 month with me.   RV 10-27-2018, Rv  Due to the Coronavirus pandemia none of above tests were performed.  I explained again that the MRA is non contrast, no invasive.  New orders for ST, PT and OT will be needed. Her left hand is dominant and she has become very clumsy.   STUDY DATE: 10/29/2018 PATIENT NAME: Pamela Alvarez DOB: Nov 06, 1940 MRN: [id number]  EXAM: MR angiogram of the intracranial arteries  ORDERING CLINICIAN: Porfirio Mylar Zohar Laing MD CLINICAL HISTORY: 79 year old woman with gait ataxia and acute cognitive changes COMPARISON FILMS: None  TECHNIQUE: MR angiogram of the head was obtained utilizing 3D time of flight sequences from below the vertebrobasilar junction up to the intracranial vasculature without contrast.  Computerized reconstructions were obtained. CONTRAST: None IMAGING SITE: [facility], [address] [place], [place], [place]  FINDINGS: The imaged extracranial and intracranial portions of the internal carotid arteries appear normal. The middle cerebral and anterior cerebral arteries appear normal.   In the posterior circulation, the right vertebral artery is dominant.  The V4 segment of the left vertebral artery is not observed in the MIP reconstructions though some flow is noted on the source images.  Mild stenosis is noted within the proximal basilar artery.  There is a fetal origin of the left posterior cerebral artery.  Some luminal irregularity is noted in the P2 segment consistent with mild stenosis.  No aneurysms were identified.   IMPRESSION: This MR  angiogram of the intracerebral arteries shows the following: 1.  Reduced flow is noted in the distal left vertebral artery.  This could be due to stenosis of the V4 segment or pre-cerebral stenosis more proximally..  If clinically indicated, consider a contrasted MR angiogram of the neck arteries to further evaluate. 2.  Mild stenosis of the proximal basilar artery.   3.  Mild stenosis of the left posterior cerebral artery which has a fetal origin (variant).     INTERPRETING PHYSICIAN:  Richard A. Epimenio Foot, MD, [name], FAAN Certified in  Neuroimaging by American Society of Neuroimaging   [facility]  NEUROIMAGING REPORT   STUDY DATE: 03/15/18 PATIENT NAME: Pamela Alvarez DOB: September 10, 1940 MRN: [id number]  ORDERING CLINICIAN: Melvyn Novas, MD  CLINICAL HISTORY: 79 year old female with memory loss and confusion.  EXAM: MRI brain (without)  TECHNIQUE: MRI of the brain without contrast was obtained utilizing 5 mm axial slices with T1, T2, T2 flair, SWI and diffusion weighted views.  T1  sagittal and T2 coronal views were obtained. CONTRAST: no (patient declined IV contrast) COMPARISON: none  [facility] [address] (1.5 Tesla MRI)    FINDINGS:   Abnormal T2 FLAIR hyperintense signal in the right frontal, right parietal and right cerebellar regions, with encephalomalacia and gliosis.  However there also appears to be underlying heterogeneous lesions with surrounding vasogenic edema.  On SWI views, there is increased susceptibility within the rim of these regions as well indicating mineralization or hemosiderin deposition.  Considerations would include embolic chronic ischemic infarcts, although underlying neoplasm (metastases) cannot be excluded.   Elsewhere few punctate foci of nonspecific gliosis in the subcortical white matter.  No abnormal lesions are seen on diffusion-weighted views to suggest acute ischemia. The cortical sulci,  fissures and cisterns are normal in size and appearance. Lateral, third and fourth ventricle are normal in size and appearance. No extra-axial fluid collections are seen. No evidence of mass effect or midline shift.    On sagittal views the posterior fossa, pituitary gland and corpus callosum are unremarkable. No evidence of intracranial hemorrhage on SWI views. The orbits and their contents, paranasal sinuses and calvarium are unremarkable.  Intracranial flow voids are present.   IMPRESSION:  Abnormal MRI brain (without) demonstrating: - Right frontal, right parietal and right cerebellar encephalomalacia and gliosis.  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  Underlying neoplastic or vascular lesions cannot be excluded, and would recommend follow-up imaging with postcontrast views. - Mild chronic small vessel ischemic disease. - No acute findings.   INTERPRETING PHYSICIAN:  Suanne MarkerVIKRAM R. PENUMALLI, MD Certified in Neurology, Neurophysiology and Neuroimaging    Review of Systems: Out of a complete 14 system review, the patient complains of only the following symptoms, and all other reviewed systems are negative.  Patient had several falls, once hit the back of the head,  Had a bid goose egg- ED did not image her.  APHASIA , lack of eye movements. Restricted horizontally and absent vertically.   her cognitive , coordination and speech declined after the fall.and has progressed to being wheelchair bound.  Poverty of speech in a patient with a [name] degree ,  stiff gait, left-right confusion and left sided clumsiness- acalculia.  Left thumb sensoryloss, clumsy hand.  Mild  Dysphagia. Strong  Dysphonia.  Aphasia.  No loss of smell or taste.    Her face is masked. She can hold small- talk. Now in a wheelchair. Left sided weakness, at home PT has noted increased rigidity, left arm clumsy and left foot everted. .   Social History   Socioeconomic History  . Marital status:  Married    Spouse name: Virl DiamondChuck  . Number of children: 2  . Years of education: Not on file  . Highest education level: Doctorate  Occupational History  . Occupation: retired    Comment: Social research officer, governmentchool psychologist [name]  Tobacco Use  . Smoking status: Never Smoker  . Smokeless tobacco: Never Used  Substance and Sexual Activity  . Alcohol use: Yes    Alcohol/week: 7.0 standard drinks    Types: 7 Glasses of wine per week    Comment: one a day  . Drug use: Not Currently  . Sexual activity: Not on file  Other Topics Concern  . Not on file  Social History Narrative   Regular exercise-yes   [place]oved back [place]   Hh of 2 cat   G3 P2    Exercises walking regu;arly    Retired  School psychologist   Drinks one cup of coffee  A day. In addition to walking QD she and her husband take a pilates class on Thursdays. She lives with her husband in a 2 story house, though the master bedroom in on the first level.       Social Determinants of Health   Financial Resource Strain:   . Difficulty of Paying Living Expenses: Not on file  Food Insecurity:   . Worried About Programme researcher, broadcasting/film/video in the Last Year: Not on file  . Ran Out of Food in the Last Year: Not on file  Transportation Needs:   . Lack of Transportation (Medical): Not on file  . Lack of Transportation (Non-Medical): Not on file  Physical Activity:   . Days of Exercise per Week: Not on file  . Minutes of Exercise per Session: Not on file  Stress:   . Feeling of Stress : Not on file  Social Connections:   . Frequency of Communication with Friends and Family: Not on file  . Frequency of Social Gatherings with Friends and Family: Not on file  . Attends Religious Services: Not on file  . Active Member of Clubs or Organizations: Not on file  . Attends Banker Meetings: Not on file  . Marital Status: Not on file  Intimate Partner Violence:   . Fear of Current or Ex-Partner: Not on file  . Emotionally Abused: Not on  file  . Physically Abused: Not on file  . Sexually Abused: Not on file    Family History  Problem Relation Age of Onset  . Hypertension Mother   . Parkinsonism Mother   . Angina Father   . Heart disease Father   . Colon cancer Father   . Lung cancer Father   . Skin cancer Father   . Cancer Father        Heart  . Breast cancer Sister        over 62  . Diabetes Sister   . Heart disease Brother 39       triple bypass surgery  . Macular degeneration Sister   . Diabetes Sister        prediabetic  . Hearing loss Sister        hearing problems  . Vasculitis Sister     Past Medical History:  Diagnosis Date  . Cyst 07-2008   Cyst on back x2 that were drained  . H/O echocardiogram 02-2000  . Hx of colonic polyps   . Hyperlipidemia   . Hypertension   . Varicose veins of both lower extremities     Past Surgical History:  Procedure Laterality Date  . POLYPECTOMY     Colon  . TONSILLECTOMY    . TUBAL LIGATION      Current Outpatient Medications  Medication Sig Dispense Refill  . apixaban (ELIQUIS) 5 MG TABS tablet Take 1 tablet (5 mg total) by mouth 2 (two) times daily. For DVT 60 tablet 3  . atorvastatin (LIPITOR) 10 MG tablet TAKE 1 TABLET BY MOUTH EVERY DAY 90 tablet 2  . carbidopa-levodopa (SINEMET IR) 25-100 MG tablet TAKE 1 TABLET BY MOUTH THREE TIMES A DAY 90 tablet 2  . Cholecalciferol (VITAMIN D3 PO) Take 1 tablet by mouth daily.    Marland Kitchen losartan (COZAAR) 25 MG tablet Take 25 mg by mouth daily.    . vitamin B-12 (CYANOCOBALAMIN) 1000 MCG tablet Take 1,000 mcg by mouth 3 (three) times a week.  No current facility-administered medications for this visit.    Allergies as of 02/12/2020 - Review Complete 02/12/2020  Allergen Reaction Noted  . Dust mite extract Cough 12/09/2015  . Sulfonamide derivatives      Vitals: BP 116/71   Pulse 91   Ht 5\' 2"  (1.575 m)   Wt 149 lb (67.6 kg)   BMI 27.25 kg/m  Last Weight:  Wt Readings from Last 1 Encounters:    02/12/20 149 lb (67.6 kg)   Last Height:   Ht Readings from Last 1 Encounters:  02/12/20 5\' 2"  (1.575 m)    Physical exam:  General: The patient is awake, alert and appears not in acute distress. The patient is well groomed. Head: Normocephalic, atraumatic. Neck is supple. Cardiovascular:  Regular rate and rhythm , without  murmurs or carotid bruit, and without distended neck veins. Respiratory: Lungs are clear to auscultation. Skin:  Without evidence of edema, or rash Trunk: BMI is 27.1 kg/m2  elevated and patient  has normal posture.  Neurologic exam : The patient is awake and alert, oriented to place and time.  Memory subjective described as impaired  There is an ab- normal attention span & concentration ability. Speech is non -fluent with aphasia. Mood and affect are meek.  Cranial nerves: Pupils are equal and briskly reactive to light. No EOM - she moves the whole head-   Funduscopic exam without  evidence of pallor or edema. Extraocular movements  in vertical planes restricted, neither able to pursuit upwards not downwards gaze-  Last time she had horizontal planes with Alvarez, coarse saccades, skipping the central vision without nystagmus.  Reduced, rare blink - masked face.   Confirmed twice that she cannot follow with downward gaze- Visual fields are restricted  Facial sensation intact to fine touch. Facial motor - reduced facial mimic.  Tongue and uvula move midline.  Tongue protrusion into either cheek is normal. Shoulder shrug is normal.   Motor exam: elevated tone, symmetric muscle bulk and symmetric strength in all extremities.  Sensory:  he seems to have higher sensitivity on the left, she did not show extinction.  Coordination: Rapid alternating movements in the fingers/hands were very much slowed Finger-to-nose maneuver with evidence of ataxia, dysmetria and just slow , slow. Gait and station: Patient in a wheelchair. Strength delayed -gait appeared stiff, no rotation  -   Deep tendon reflexes: in the upper and lower extremities are brisk. Babinski maneuver response is  downgoing.  Assessment:  After physical and neurologic examination, review of laboratory studies, imaging, neurophysiology testing and pre-existing records, assessment is that of :   Neurodegenerative disease process - as we ruled out vascular injuries.  - Gliosis confirms the trauma is most likely the cause of scars unclear if these caused the dementia , but they apply to the hemilateral findings. - see MRI brain.   Progressive - oculomotor palsy, now not able to look up or downwards but also not moving eyes on the horizontal plane.  increased muscle tone, rigidity and propulsive fall risk now wheelchair bound .  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   Left thumb clumsiness. Now clearly increased rigidity in the left wrist, biceps. Hyperreflexia over both patellae.     14/06/21  MMSE - Mini Mental State Exam 10/12/2019 03/27/2019 02/08/2018  Orientation to time 0 2 1  Orientation to Place 0 2 5  Registration 3 3 3   Attention/ Calculation 0 0 0  Recall  2 2 1   Language- name 2 objects Language- repeat 1 1 0  Language- follow 3 step command Language- read & follow direction 0 0 1  Write a sentence 0 0 0  Copy design 0 0 0  Total score Progressive Supranuclear ocular palsy-  Aphasia, masked face.  Duration 35 minutes.   Plan:  Treatment plan and additional workup :  renewal orders for ST, PT and OT will be needed.  Wheelchair patient, will be fitted for the right chair with the help of PT. Local vendor.   Home health care needed, but the patient had thus far refused.   DVT has been treated with Eliquis, followed by Dr Pamela Alvarez.   Her left hand is dominant and she has become very clumsy on the left, but also feels a stronger sensitivity to fine touch and pin prick. Her face is masked. She can hold a liitle small- talk.  Mainly answering closed questions.  Now in a wheelchair. Left sided weakness, at home PT has noted increase with rigidity ( tid) .     Rv in 6 month with MMSE- Either by MD or  by NP,  but I will be available for consultation.  Porfirio Mylar Lorena Clearman MD 02/12/2020

## 2020-02-12 NOTE — Therapy (Signed)
[facility] [address] [place], [place], [address] Phone: [phone]   Fax:  [phone]  Physical Therapy Treatment  Patient Details  Name: Viha Kriegel MRN: [id number] Date of Birth: Dec 04, 1940 Referring Provider (PT): Dohmeier, Porfirio Mylar   Encounter Date: 02/12/2020   PT End of Session - 02/12/20 2155    Visit Number 18    Number of Visits 23   per recert 01/24/2020   Date for PT Re-Evaluation 03/07/20    Authorization Type [employer] Medicare-submitted auth at completion of eval; new auth request for 10 additional visits requested after visit 12/21/2019    Authorization Time Period 10/21-12/30/21 for 10 visits    Authorization - Visit Number 6    Authorization - Number of Visits 10    Progress Note Due on Visit 20    PT Start Time 1201    PT Stop Time 1101    PT Time Calculation (min) 1380 min    Equipment Utilized During Treatment Gait belt    Activity Tolerance Patient tolerated treatment well    Behavior During Therapy Flat affect;WFL for tasks assessed/performed           Past Medical History:  Diagnosis Date  . Cyst 07-2008   Cyst on back x2 that were drained  . H/O echocardiogram 02-2000  . Hx of colonic polyps   . Hyperlipidemia   . Hypertension   . Varicose veins of both lower extremities     Past Surgical History:  Procedure Laterality Date  . POLYPECTOMY     Colon  . TONSILLECTOMY    . TUBAL LIGATION      There were no vitals filed for this visit.   Subjective Assessment - 02/12/20 1035    Subjective Dr. Vickey Huger took away the Sinemet, not needed since she isn't having tremors.  Wrote an order for power w/c.    Patient is accompained by: Family member   Husband-provides history   Patient Stated Goals To try to maintain as much as possible, try to prevent regression as much as possible.                             [facility] Adult PT Treatment/Exercise - 02/12/20 0001       Transfers   Transfers Sit to Stand;Stand to Sit    Sit to Stand 4: Min assist;From bed;With upper extremity assist;From chair/3-in-1    Sit to Stand Details Tactile cues for initiation;Tactile cues for sequencing;Tactile cues for placement;Verbal cues for technique;Verbal cues for precautions/safety;Manual facilitation for weight shifting;Manual facilitation for placement    Stand to Sit 4: Min assist;3: Mod assist;To bed    Stand to Sit Details (indicate cue type and reason) Tactile cues for initiation;Tactile cues for placement;Manual facilitation for placement;Manual facilitation for weight shifting    Stand to Sit Details Constant cues for safety with stand>sit      Ambulation/Gait   Ambulation/Gait Yes    Ambulation/Gait Assistance 4: Min assist;3: Mod assist    Ambulation/Gait Assistance Details HHA with PT in front of patient, assisting with weightshifting x 15 ft, then x 8 ft out of parallel bars to w/c    Assistive device 1 person hand held assist    Gait Pattern Step-through pattern;Step-to pattern;Decreased step length - right;Decreased step length - left;Decreased stride length;Decreased dorsiflexion - right;Decreased dorsiflexion - left;Trunk flexed;Poor foot clearance - left;Poor foot clearance - right;Trunk rotated posteriorly on left  Ambulation Surface Level;Indoor      High Level Balance   High Level Balance Activities Other (comment)    High Level Balance Comments Standing in parallel bars, wide BOS lateral weightshifting with tactile cues at hips, 2 sets x 10 reps.  Forward kicks to target x 10 reps.  Step taps to black flat obstalcle x 10 reps, 2 sets; stepping over obstacle, 10 reps with cues for increased step length.  Attempted heel raises x 20 reps bilateral, with pt mainly performing with RLE.      Self-Care   Self-Care Other Self-Care Comments    Other Self-Care Comments  Discussed pt's MD visit this morning that Dr. Vickey Huger wrote prescription for power wheelchair.   Discussed scheduling with this therapist (as pt is known to her and can complete documentation, allowing for 45 minute visit from w/c vendor), to allow for easier scheduling.  Pt/husband in agreement to proceed and verbalize they would like to try using [employer] for power w/c.  PT communicates this information to front office for f/u with scheduling vendor.      Knee/Hip Exercises: Seated   Long Arc Quad AROM;Strengthening;Both;10 reps;Limitations;2 sets    [place] Limitations Cues for full knee extension LLE    Other Seated Knee/Hip Exercises seated ankle pumps x 10 reps, 2 sets; also in LAQ position ankle pumps x 10 rep each leg.    Marching AROM;Strengthening;Both;2 sets;10 reps;Limitations    Marching Limitations pt with more difficulty sequencing seated marching today.          Postural cues provided between sets of exercises for more upright posture.  Pt reverts back into posterior pelvic tilt position with leg motions.  Standing balance (cont'd):  Stagger stance forward/back rocking x 10 reps each leg position, with PT providing cues through hips for increased weightshifting.       PT Education - 02/12/20 2155    Education Details Process for w/c assessment-see self care    Person(s) Educated Patient    Methods Explanation    Comprehension Verbalized understanding            PT Short Term Goals - 01/26/20 1428      PT SHORT TERM GOAL #1   Title Pt will perform at least 4 minutes of continuous gait activity with rollator, min assist, for improved endurance for gait in the home.  TARGET 02/16/2020    Baseline 11/17//21-pt requires mod assist to amb with rollator and only able to amb 110' with standing rest break    Time 4    Period Weeks    Status On-going      PT SHORT TERM GOAL #2   Title Pt will perform 10 reps of sit<>stand with minimal assistance for improved transfer efficiency and functional strength.    Baseline 5 reps with mod assist on 01/24/20    Time  4    Period Weeks    Status On-going      PT SHORT TERM GOAL #3   Title Pt will improve TUG score to less than or equal to 60 seconds for decreased fall risk.    Baseline 53.69 sec at eval; 53.91 11/28/2019;120 sec on 01/24/20    Time 4    Period Weeks    Status Revised             PT Long Term Goals - 01/26/20 1430      PT LONG TERM GOAL #1   Title Pt/husband will verbalize and demonstrate understanding  of final HEP to maintain functional gains made in therapy.  TARGET 03/01/2020    Time 6    Period Weeks    Status On-going      PT LONG TERM GOAL #2   Title Pt will perform TUG shuttle activity with rollator and min guard, at least 3 sets without break, to demonstrate improved transfer efficiency and safety.    Time 6    Period Weeks    Status On-going      PT LONG TERM GOAL #3   Title Pt will improve TUG score to less than or equal to 45 seconds for decreased fall risk/improved mobility in the home.    Time 6    Period Weeks    Status On-going      PT LONG TERM GOAL #4   Title Pt/husband will verbalize understanding of fall prevention in home environment.    Time 6    Period Weeks    Status On-going      PT LONG TERM GOAL #5   Title Pt will improve gait velocity to at least 1.1 ft/sec for improved gait efficiency and safety in the home.    Time 6    Period Weeks    Status On-going                 Plan - 02/12/20 2159    Clinical Impression Statement Pt with slower processing with exercise activities today in both sitting and standing, with increased preference for RLE over LLE with standing activities.  Even though we are walking shorted distances with bil HHA, PT able to help shift weight for improved ease of movement with gait versus rollator.  Pt now has order from Dr. Vickey Hugerohmeier for power w/c and pt/husband in agreement to proceed with [employer] for w/c assessment.    Personal Factors and Comorbidities Comorbidity 3+;Other    Comorbidities See problem  list; recent diagnosis of PSP    Examination-Activity Limitations Locomotion Level;Transfers;Dressing;Stairs;Stand;Toileting    Examination-Participation Restrictions Community Activity;Shop    Stability/Clinical Decision Making Evolving/Moderate complexity    Rehab Potential Good    PT Frequency 2x / week    PT Duration 6 weeks   per recert/including week of 01/24/2020   PT Treatment/Interventions ADLs/Self Care Home Management;Gait training;Functional mobility training;Therapeutic activities;Therapeutic exercise;Balance training;Neuromuscular re-education;Patient/family education    PT Next Visit Plan Check STGs next visit; may need to perform gait measures with HHA vs rollator (therapist in front of pt with pt's hands at shoulders); seated and standing exercises; work on step length/foot clearance    Recommended Other Services w/c assessment (order on chart 02/12/2020) and front office is working on scheduling    Consulted and Agree with Plan of Care Patient;Family member/caregiver    Family Member Consulted Husband           Patient will benefit from skilled therapeutic intervention in order to improve the following deficits and impairments:  Abnormal gait, Difficulty walking, Decreased safety awareness, Decreased endurance, Decreased activity tolerance, Decreased balance, Decreased mobility, Decreased strength, Postural dysfunction  Visit Diagnosis: Unsteadiness on feet  Muscle weakness (generalized)  Other abnormalities of gait and mobility     Problem List Patient Active Problem List   Diagnosis Date Noted  . Unable to ambulate 02/12/2020  . Progressive supranuclear palsy (HCC) 10/12/2019  . Encephalomalacia on imaging study 10/12/2019  . Fluency disorder associated with underlying disease 02/08/2018  . Cognitive and neurobehavioral dysfunction 02/08/2018  . 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 Arthritis  of carpometacarpal [facility]) joint of left thumb 01/24/2018  . Ankle  fracture, left 09/03/2016  . Hand dysfunction 03/11/2016  . Symptomatic varicose veins, bilateral 03/11/2016  . Numbness of left thumb 03/11/2016  . Elevated BP 07/27/2014  . Bunion of left foot 07/26/2014  . Abnormality of gait 07/26/2014  . Visit for preventive health examination 07/28/2013  . Right anterior knee pain 07/22/2012  . Medicare annual wellness visit, subsequent 07/25/2011  . ANXIETY, SITUATIONAL 09/25/2009  . ARTHRITIS 09/25/2009  . Vitamin D deficiency 09/27/2008  . Hyperlipidemia 09/17/2008  . Elevated blood pressure reading without diagnosis of hypertension 09/17/2008  . COLONIC POLYPS, HX OF 09/17/2008  . PERSONAL HISTORY DISEASES SKIN&SUBCUT TISSUE 09/17/2008    MARRIOTT,AMY W. 02/12/2020, 10:06 PM  Gean Maidens., PT   [facility] [address] [place], [place], [address] Phone: [phone]   Fax:  [phone]  Name: Myonna Chisom MRN: [id number] Date of Birth: [birth year]

## 2020-02-14 ENCOUNTER — Ambulatory Visit: Payer: Medicare PPO | Admitting: Speech Pathology

## 2020-02-14 ENCOUNTER — Encounter: Payer: Self-pay | Admitting: Speech Pathology

## 2020-02-14 ENCOUNTER — Other Ambulatory Visit: Payer: Self-pay

## 2020-02-14 ENCOUNTER — Encounter: Payer: Self-pay | Admitting: Physical Therapy

## 2020-02-14 ENCOUNTER — Ambulatory Visit: Payer: Medicare PPO | Admitting: Physical Therapy

## 2020-02-14 DIAGNOSIS — R482 Apraxia: Secondary | ICD-10-CM

## 2020-02-14 DIAGNOSIS — M6281 Muscle weakness (generalized): Secondary | ICD-10-CM | POA: Diagnosis not present

## 2020-02-14 DIAGNOSIS — R2689 Other abnormalities of gait and mobility: Secondary | ICD-10-CM

## 2020-02-14 DIAGNOSIS — R2681 Unsteadiness on feet: Secondary | ICD-10-CM | POA: Diagnosis not present

## 2020-02-14 DIAGNOSIS — R4701 Aphasia: Secondary | ICD-10-CM | POA: Diagnosis not present

## 2020-02-14 DIAGNOSIS — R41841 Cognitive communication deficit: Secondary | ICD-10-CM

## 2020-02-14 DIAGNOSIS — R29818 Other symptoms and signs involving the nervous system: Secondary | ICD-10-CM | POA: Diagnosis not present

## 2020-02-14 NOTE — Therapy (Signed)
[facility] [address] [place], [place], [address] Phone: [phone]   Fax:  [phone]  Speech Language Pathology Treatment  Patient Details  Name: Pamela Alvarez MRN: [id number] Date of Birth: 07/20/1940 Referring Provider (SLP): Dohmeier, Asencion Partridge, MD   Encounter Date: 02/14/2020   End of Session - 02/14/20 1148    Visit Number 10    Number of Visits 17    Date for SLP Re-Evaluation 03/13/20   extended date as pt on hold for AAC device   SLP Start Time 1016    SLP Stop Time  1058    SLP Time Calculation (min) 42 min    Activity Tolerance Patient tolerated treatment well           Past Medical History:  Diagnosis Date  . Cyst 07-2008   Cyst on back x2 that were drained  . H/O echocardiogram 02-2000  . Hx of colonic polyps   . Hyperlipidemia   . Hypertension   . Varicose veins of both lower extremities     Past Surgical History:  Procedure Laterality Date  . POLYPECTOMY     Colon  . TONSILLECTOMY    . TUBAL LIGATION      There were no vitals filed for this visit.   Subjective Assessment - 02/14/20 1021    Subjective "It took a long time to get it"    Patient is accompained by: Family member   spouse   Currently in Pain? No/denies             Speech Therapy Progress Note  Dates of Reporting Period: 8/31-21 to 01/09/20  Objective Reports of Subjective Statement: Pt was on hold for 4 weeks in order to receive AAC device.   Objective Measurements: See goals  Goal Update: Continue goals  Plan: Continue POC  Reason Skilled Services are Required: Pt and spouse have received [employer] Touch Talk. Require training in programming and using device in conversation and at medical appointments      ADULT SLP TREATMENT - 02/14/20 1023      General Information   Behavior/Cognition Alert;Cooperative      Treatment Provided   Treatment provided Cognitive-Linquistic      Cognitive-Linquistic  Treatment   Treatment focused on Cognition;Aphasia;Patient/family/caregiver education    Skilled Treatment Pt arrives with Touch Talk. Harrie Jeans has programmed some icons. Training to program photos of family, move icons, use workspace. Educated [place] on using folder option and moving most pertient icons to top of page. Role played doctor's office with Pamela Alvarez. She required extended time and usual min A to use device to state birthday, diagnosis and address.       Assessment / Recommendations / Plan   Plan Continue with current plan of care              SLP Short Term Goals - 02/14/20 1147      SLP SHORT TERM GOAL #1   Title pt will relay simple biographical information using AAC if necessary with mod cues x 3 visits.    Baseline 12/14/19    Time 1    Period Weeks    Status Partially Met      SLP SHORT TERM GOAL #2   Title pt will respond to simple questions re: preferences, using AAC if necessary, with mod cues x3 visits.    Baseline 12/12/19 12/14/19, 01/09/20    Time 2    Period Weeks    Status Achieved  SLP SHORT TERM GOAL #3   Title Pt will ask an appropriate question in simple communication exchange, using AAC if necessary, in 2 sessions.    Baseline 12/14/19    Time 2    Period Weeks    Status Partially Met            SLP Long Term Goals - 02/14/20 1147      SLP LONG TERM GOAL #1   Title pt/spouse will report improved communication abilities than prior to beginning ST    Time 4    Period Weeks    Status On-going      SLP LONG TERM GOAL #2   Title Pt/spouse will report using AAC/supportive conversation strategies to resolve communication breakdown outside [place] x 3 visits  with mod caregiver assistance    Baseline 12/14/19    Time 4    Period Weeks    Status On-going      SLP LONG TERM GOAL #3   Title Pt will make simple requests, using AAC if necessary, outside [place] room x 3 sessions with mod caregiver assistance    Baseline 12/14/19    Time 4   renewed 11/24    Period Weeks    Status On-going   omitted "over three sessions"     SLP LONG TERM GOAL #4   Title Pt will use AAC 3 in a simple social exchange (phone or in person gathering) with mod cues from spouse, x 3 sessions.    Time --   renewed 11/24     SLP LONG TERM GOAL #5   Time --   renewed 11/24           Plan - 02/14/20 1144    Clinical Impression Statement Pamela Alvarez continues to present with aphasia and cognitive deficits secondary to PSP, recently diagnosed. She has received [employer] Touch Talk to aid communication. Ongoing traing of spouse in prgramming and assisting pt in using device, and training Pamela Alvarez on communicating with device. Their goals are for Pamela Alvarez to particiate in conversation with friends and in [place] appointment.    Speech Therapy Frequency 2x / week    Duration 8 weeks   17 visits   Treatment/Interventions SLP instruction and feedback;Compensatory strategies;Patient/family education;Multimodal communcation approach;Cueing hierarchy;Language facilitation;Functional tasks;Oral motor exercises;Internal/external aids;Environmental controls;Cognitive reorganization    Potential to Achieve Goals Fair    Potential Considerations Severity of impairments           Patient will benefit from skilled therapeutic intervention in order to improve the following deficits and impairments:   Aphasia  Cognitive communication deficit  Verbal apraxia    Problem List Patient Active Problem List   Diagnosis Date Noted  . Unable to ambulate 02/12/2020  . Progressive supranuclear palsy ([place]) 10/12/2019  . Encephalomalacia on imaging study 10/12/2019  . Fluency disorder associated with underlying disease 02/08/2018  . Cognitive and neurobehavioral dysfunction 02/08/2018  . 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 Arthritis of carpometacarpal [facility]) joint of left thumb 01/24/2018  . Ankle fracture, left 09/03/2016  . Hand dysfunction 03/11/2016  . Symptomatic varicose  veins, bilateral 03/11/2016  . Numbness of left thumb 03/11/2016  . Elevated BP 07/27/2014  . Bunion of left foot 07/26/2014  . Abnormality of gait 07/26/2014  . Visit for preventive health examination 07/28/2013  . Right anterior knee pain 07/22/2012  . Medicare annual wellness visit, subsequent 07/25/2011  . ANXIETY, SITUATIONAL 09/25/2009  . ARTHRITIS 09/25/2009  . Vitamin D deficiency 09/27/2008  . Hyperlipidemia  09/17/2008  . Elevated blood pressure reading without diagnosis of hypertension 09/17/2008  . COLONIC POLYPS, HX OF 09/17/2008  . PERSONAL HISTORY DISEASES SKIN&SUBCUT TISSUE 09/17/2008    Amun Stemm, Annye Rusk 02/14/2020, 11:50 AM  [facility] [address] [place], [place], [address] Phone: [phone]   Fax:  [phone]   Name: Nita Whitmire MRN: [id number] Date of Birth: [birth year]

## 2020-02-14 NOTE — Patient Instructions (Signed)
   [place]:  Not deleting icons  How to back up  How to update device  To add a folder, click page above photo in edit or when making new icon

## 2020-02-14 NOTE — Therapy (Signed)
[facility] [address] [place], [place], [address] Phone: [phone]   Fax:  [phone]  Physical Therapy Treatment  Patient Details  Name: Pamela Alvarez MRN: [id number] Date of Birth: 1940-04-23 Referring Provider (PT): Dohmeier, Asencion Partridge   Encounter Date: 02/14/2020   PT End of Session - 02/14/20 1150    Visit Number 19    Number of Visits 23   per recert 02/63/7858   Date for PT Re-Evaluation 03/07/20    Authorization Type [employer] Medicare-submitted auth at completion of eval; new auth request for 10 additional visits requested after visit 12/21/2019    Authorization Time Period 10/21-12/30/21 for 10 visits    Authorization - Visit Number 7    Authorization - Number of Visits 10    Progress Note Due on Visit 20    PT Start Time 1100    PT Stop Time 1145    PT Time Calculation (min) 45 min    Equipment Utilized During Treatment Gait belt    Activity Tolerance Patient tolerated treatment well    Behavior During Therapy Flat affect;WFL for tasks assessed/performed           Past Medical History:  Diagnosis Date  . Cyst 07-2008   Cyst on back x2 that were drained  . H/O echocardiogram 02-2000  . Hx of colonic polyps   . Hyperlipidemia   . Hypertension   . Varicose veins of both lower extremities     Past Surgical History:  Procedure Laterality Date  . POLYPECTOMY     Colon  . TONSILLECTOMY    . TUBAL LIGATION      There were no vitals filed for this visit.   Subjective Assessment - 02/14/20 1149    Subjective Denies any falls or changes.  Is not using assistive device at home.  Husband provides HHA as he feels like it is safer for them.    Patient is accompained by: Family member   Husband-provides history   Patient Stated Goals To try to maintain as much as possible, try to prevent regression as much as possible.    Currently in Pain? No/denies                             [facility]  Adult PT Treatment/Exercise - 02/14/20 0001      Transfers   Transfers Sit to Stand;Stand to Sit    Sit to Stand 4: Min assist;3: Mod assist    Sit to Stand Details Tactile cues for initiation;Tactile cues for sequencing;Tactile cues for placement;Verbal cues for technique;Verbal cues for precautions/safety;Manual facilitation for weight shifting;Manual facilitation for placement    Stand to Sit 4: Min assist;3: Mod assist    Stand to Sit Details (indicate cue type and reason) Tactile cues for initiation;Tactile cues for sequencing;Manual facilitation for weight shifting;Manual facilitation for weight bearing    Stand to Sit Details continues to need constant cues to turn to chair and reach back.  attempts to sit before close enough    Number of Reps Other reps (comment)   7     Ambulation/Gait   Ambulation/Gait Yes    Ambulation/Gait Assistance 4: Min assist    Ambulation/Gait Assistance Details pt able to ambulate 4 minutes 50 seconds and walk 150'    Ambulation Distance (Feet) 150 Feet   30' x 3   Assistive device Rollator;Other (Comment)   HHA x 2   Gait Pattern Step-through pattern;Step-to  pattern;Decreased step length - right;Decreased step length - left;Decreased stride length;Decreased dorsiflexion - right;Decreased dorsiflexion - left;Trunk flexed;Poor foot clearance - left;Poor foot clearance - right;Trunk rotated posteriorly on left    Ambulation Surface Level;Indoor    Pre-Gait Activities bil marching at chair with mod assist and cues x 15 reps      Posture/Postural Control   Posture/Postural Control Postural limitations    Postural Limitations Forward head;Rounded Shoulders;Flexed trunk      Standardized Balance Assessment   Standardized Balance Assessment Timed Up and Go Test      Timed Up and Go Test   TUG Normal TUG    Normal TUG (seconds) 95   mod a of PTA providing bil UE assist and weight shifting     Exercises   Exercises Knee/Hip;Other Exercises      Knee/Hip  Exercises: Aerobic   Other Aerobic Foot pedaler x 5 minutes with pt seated in chair and 2 short rest breaks.  Performed in backward direction x 45 sec with PTA keeping pt going in this direction with min assist      Knee/Hip Exercises: Seated   Long Arc Quad AROM;Strengthening;Both;10 reps;Limitations;2 sets    Long Arc Quad Limitations Cues for full knee extension LLE    Marching AROM;Strengthening;Both;2 sets;10 reps;Limitations    Marching Limitations needs cues to increase ROM on L side                    PT Short Term Goals - 02/14/20 1201      PT SHORT TERM GOAL #1   Title Pt will perform at least 4 minutes of continuous gait activity with rollator, min assist, for improved endurance for gait in the home.  TARGET 02/16/2020    Baseline able to ambulate 150' in 4 minutes 50 seconds with rollator and min assist on 02/14/20    Time 4    Period Weeks    Status Achieved      PT SHORT TERM GOAL #2   Title Pt will perform 10 reps of sit<>stand with minimal assistance for improved transfer efficiency and functional strength.    Baseline 7 reps with min-mod assist and posterior lean on 02/14/20    Time 4    Period Weeks    Status Not Met      PT SHORT TERM GOAL #3   Title Pt will improve TUG score to less than or equal to 60 seconds for decreased fall risk.    Baseline 02/14/20 95 seconds with HHA of PTA with pt's hands on PTA arms and PTA walking backwards    Time 4    Period Weeks    Status Not Met             PT Long Term Goals - 01/26/20 1430      PT LONG TERM GOAL #1   Title Pt/husband will verbalize and demonstrate understanding of final HEP to maintain functional gains made in therapy.  TARGET 03/01/2020    Time 6    Period Weeks    Status On-going      PT LONG TERM GOAL #2   Title Pt will perform TUG shuttle activity with rollator and min guard, at least 3 sets without break, to demonstrate improved transfer efficiency and safety.    Time 6    Period  Weeks    Status On-going      PT LONG TERM GOAL #3   Title Pt will improve TUG score to less  than or equal to 45 seconds for decreased fall risk/improved mobility in the home.    Time 6    Period Weeks    Status On-going      PT LONG TERM GOAL #4   Title Pt/husband will verbalize understanding of fall prevention in home environment.    Time 6    Period Weeks    Status On-going      PT LONG TERM GOAL #5   Title Pt will improve gait velocity to at least 1.1 ft/sec for improved gait efficiency and safety in the home.    Time 6    Period Weeks    Status On-going                 Plan - 02/14/20 1208    Clinical Impression Statement Pt met STG #1.  STG# 2 and 3 not met as pt continues to require mod assist with transfers and increased time to complete tasks.  Pt continues with flat affect through out session.  Cont per poc.    Personal Factors and Comorbidities Comorbidity 3+;Other    Comorbidities See problem list; recent diagnosis of PSP    Examination-Activity Limitations Locomotion Level;Transfers;Dressing;Stairs;Stand;Toileting    Examination-Participation Restrictions Community Activity;Shop    Stability/Clinical Decision Making Evolving/Moderate complexity    Rehab Potential Good    PT Frequency 2x / week    PT Duration 6 weeks   per recert/including week of 01/24/2020   PT Treatment/Interventions ADLs/Self Care Home Management;Gait training;Functional mobility training;Therapeutic activities;Therapeutic exercise;Balance training;Neuromuscular re-education;Patient/family education    PT Next Visit Plan PT to determine if additional visits needed for current POC.  Seated and standing exercises; work on step length/foot clearance    Consulted and Agree with Plan of Care Patient;Family member/caregiver    Family Member Consulted Husband           Patient will benefit from skilled therapeutic intervention in order to improve the following deficits and impairments:   Abnormal gait, Difficulty walking, Decreased safety awareness, Decreased endurance, Decreased activity tolerance, Decreased balance, Decreased mobility, Decreased strength, Postural dysfunction  Visit Diagnosis: Unsteadiness on feet  Muscle weakness (generalized)  Other abnormalities of gait and mobility     Problem List Patient Active Problem List   Diagnosis Date Noted  . Unable to ambulate 02/12/2020  . Progressive supranuclear palsy ([place]) 10/12/2019  . Encephalomalacia on imaging study 10/12/2019  . Fluency disorder associated with underlying disease 02/08/2018  . Cognitive and neurobehavioral dysfunction 02/08/2018  . 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 Arthritis of carpometacarpal [facility]) joint of left thumb 01/24/2018  . Ankle fracture, left 09/03/2016  . Hand dysfunction 03/11/2016  . Symptomatic varicose veins, bilateral 03/11/2016  . Numbness of left thumb 03/11/2016  . Elevated BP 07/27/2014  . Bunion of left foot 07/26/2014  . Abnormality of gait 07/26/2014  . Visit for preventive health examination 07/28/2013  . Right anterior knee pain 07/22/2012  . Medicare annual wellness visit, subsequent 07/25/2011  . ANXIETY, SITUATIONAL 09/25/2009  . ARTHRITIS 09/25/2009  . Vitamin D deficiency 09/27/2008  . Hyperlipidemia 09/17/2008  . Elevated blood pressure reading without diagnosis of hypertension 09/17/2008  . COLONIC POLYPS, HX OF 09/17/2008  . PERSONAL HISTORY DISEASES SKIN&SUBCUT TISSUE 09/17/2008    Pamela Alvarez, PTA [place] 02/14/20 12:12 PM Phone: [phone] Fax: [facility] [address] [place], [place], [address] Phone: [phone]   Fax:  [phone]  Name: Pamela Alvarez  MRN: [id number] Date of Birth: Jun 05, 1940

## 2020-02-19 ENCOUNTER — Ambulatory Visit: Payer: Medicare PPO | Admitting: Physical Therapy

## 2020-02-21 ENCOUNTER — Encounter: Payer: Self-pay | Admitting: Physical Therapy

## 2020-02-21 ENCOUNTER — Encounter: Payer: Self-pay | Admitting: Speech Pathology

## 2020-02-21 ENCOUNTER — Ambulatory Visit: Payer: Medicare PPO | Admitting: Physical Therapy

## 2020-02-21 ENCOUNTER — Ambulatory Visit: Payer: Medicare PPO | Admitting: Speech Pathology

## 2020-02-21 ENCOUNTER — Other Ambulatory Visit: Payer: Self-pay

## 2020-02-21 DIAGNOSIS — R2681 Unsteadiness on feet: Secondary | ICD-10-CM | POA: Diagnosis not present

## 2020-02-21 DIAGNOSIS — R41841 Cognitive communication deficit: Secondary | ICD-10-CM | POA: Diagnosis not present

## 2020-02-21 DIAGNOSIS — R482 Apraxia: Secondary | ICD-10-CM | POA: Diagnosis not present

## 2020-02-21 DIAGNOSIS — M6281 Muscle weakness (generalized): Secondary | ICD-10-CM

## 2020-02-21 DIAGNOSIS — R4701 Aphasia: Secondary | ICD-10-CM | POA: Diagnosis not present

## 2020-02-21 DIAGNOSIS — R29818 Other symptoms and signs involving the nervous system: Secondary | ICD-10-CM | POA: Diagnosis not present

## 2020-02-21 DIAGNOSIS — R2689 Other abnormalities of gait and mobility: Secondary | ICD-10-CM

## 2020-02-21 NOTE — Therapy (Signed)
[facility] [address] [place], [place], [address] Phone: [phone]   Fax:  [phone]  Physical Therapy Treatment  Patient Details  Name: Pamela Alvarez MRN: [id number] Date of Birth: 1940/05/06 Referring Provider (PT): Dohmeier, Asencion Partridge   Encounter Date: 02/21/2020   PT End of Session - 02/21/20 1143    Visit Number 20    Number of Visits 23   per recert 20/25/4270   Date for PT Re-Evaluation 03/07/20    Authorization Type [employer] Medicare-submitted auth at completion of eval; new auth request for 10 additional visits requested after visit 12/21/2019    Authorization Time Period 10/21-12/30/21 for 10 visits    Authorization - Visit Number 8    Authorization - Number of Visits 10    Progress Note Due on Visit 20    PT Start Time 1148    PT Stop Time 1231    PT Time Calculation (min) 43 min    Equipment Utilized During Treatment Gait belt    Activity Tolerance Patient tolerated treatment well    Behavior During Therapy Flat affect;WFL for tasks assessed/performed           Past Medical History:  Diagnosis Date  . Cyst 07-2008   Cyst on back x2 that were drained  . H/O echocardiogram 02-2000  . Hx of colonic polyps   . Hyperlipidemia   . Hypertension   . Varicose veins of both lower extremities     Past Surgical History:  Procedure Laterality Date  . POLYPECTOMY     Colon  . TONSILLECTOMY    . TUBAL LIGATION      There were no vitals filed for this visit.            [facility] Adult PT Treatment/Exercise - 02/21/20 0001      Transfers   Transfers Sit to Stand;Stand to Sit    Sit to Stand 4: Min assist;3: Mod assist    Sit to Stand Details Tactile cues for initiation;Tactile cues for sequencing;Tactile cues for placement;Verbal cues for technique;Verbal cues for precautions/safety;Manual facilitation for weight shifting;Manual facilitation for placement    Stand to Sit 4: Min assist;3: Mod  assist    Stand to Sit Details (indicate cue type and reason) Tactile cues for initiation;Tactile cues for sequencing;Manual facilitation for weight shifting;Manual facilitation for weight bearing    Transfer Cueing continues to need significant cues and assistance to keep pt from sitting before she is close enough to chair/mat/bed.  [place] to sit even with verbal and tactile cues      Ambulation/Gait   Ambulation/Gait Yes    Ambulation/Gait Assistance 4: Min assist;3: Mod assist   with fatigue need mod vs min-mod   Ambulation Distance (Feet) 40 Feet   x 2, 30' x 2   Assistive device 2 person hand held assist   with PTA in front of pt with pt holding PTA's forearms   Gait Pattern Step-to pattern;Decreased step length - right;Decreased step length - left;Decreased stride length;Decreased hip/knee flexion - right;Decreased hip/knee flexion - left;Decreased dorsiflexion - right;Decreased dorsiflexion - left;Decreased weight shift to right;Decreased weight shift to left;Poor foot clearance - left;Poor foot clearance - right    Ambulation Surface Level;Indoor    Pre-Gait Activities standing in // bars for weight shifting side<>side with mod assist      Exercises   Exercises Knee/Hip      Knee/Hip Exercises: Aerobic   Nustep All 4 extremities with workoad of 3 and maintaining steps  per minute>50 for 5 minutes with 30 sec rest break then another 5 minutes.                    PT Short Term Goals - 02/14/20 1201      PT SHORT TERM GOAL #1   Title Pt will perform at least 4 minutes of continuous gait activity with rollator, min assist, for improved endurance for gait in the home.  TARGET 02/16/2020    Baseline able to ambulate 150' in 4 minutes 50 seconds with rollator and min assist on 02/14/20    Time 4    Period Weeks    Status Achieved      PT SHORT TERM GOAL #2   Title Pt will perform 10 reps of sit<>stand with minimal assistance for improved transfer efficiency and functional  strength.    Baseline 7 reps with min-mod assist and posterior lean on 02/14/20    Time 4    Period Weeks    Status Not Met      PT SHORT TERM GOAL #3   Title Pt will improve TUG score to less than or equal to 60 seconds for decreased fall risk.    Baseline 02/14/20 95 seconds with HHA of PTA with pt's hands on PTA arms and PTA walking backwards    Time 4    Period Weeks    Status Not Met             PT Long Term Goals - 01/26/20 1430      PT LONG TERM GOAL #1   Title Pt/husband will verbalize and demonstrate understanding of final HEP to maintain functional gains made in therapy.  TARGET 03/01/2020    Time 6    Period Weeks    Status On-going      PT LONG TERM GOAL #2   Title Pt will perform TUG shuttle activity with rollator and min guard, at least 3 sets without break, to demonstrate improved transfer efficiency and safety.    Time 6    Period Weeks    Status On-going      PT LONG TERM GOAL #3   Title Pt will improve TUG score to less than or equal to 45 seconds for decreased fall risk/improved mobility in the home.    Time 6    Period Weeks    Status On-going      PT LONG TERM GOAL #4   Title Pt/husband will verbalize understanding of fall prevention in home environment.    Time 6    Period Weeks    Status On-going      PT LONG TERM GOAL #5   Title Pt will improve gait velocity to at least 1.1 ft/sec for improved gait efficiency and safety in the home.    Time 6    Period Weeks    Status On-going                 Plan - 02/21/20 1205    Clinical Impression Statement Discussed adding additional visits vs d/c with Mady Haagensen, PT.  PT would like to have pt return for a visit to discuss.  Have scheduled pt for 12/29 visit.  Pt continues to need significant cues and assistance with gait and transfers.  Attempts to sit before close enough to chair and actually had a fall with husband due to this.  Pt also with progressive decreased endurance for gait distance.   DId tolerate Scifit well today.  PT to see next visit.    Personal Factors and Comorbidities Comorbidity 3+;Other    Comorbidities See problem list; recent diagnosis of PSP    Examination-Activity Limitations Locomotion Level;Transfers;Dressing;Stairs;Stand;Toileting    Examination-Participation Restrictions Community Activity;Shop    Stability/Clinical Decision Making Evolving/Moderate complexity    Rehab Potential Good    PT Frequency 2x / week    PT Duration 6 weeks   per recert/including week of 01/24/2020   PT Treatment/Interventions ADLs/Self Care Home Management;Gait training;Functional mobility training;Therapeutic activities;Therapeutic exercise;Balance training;Neuromuscular re-education;Patient/family education    PT Next Visit Plan PT to determine if additional visits needed for current POC.    Consulted and Agree with Plan of Care Patient;Family member/caregiver    Family Member Consulted Husband           Patient will benefit from skilled therapeutic intervention in order to improve the following deficits and impairments:  Abnormal gait,Difficulty walking,Decreased safety awareness,Decreased endurance,Decreased activity tolerance,Decreased balance,Decreased mobility,Decreased strength,Postural dysfunction  Visit Diagnosis: Unsteadiness on feet  Muscle weakness (generalized)  Other abnormalities of gait and mobility     Problem List Patient Active Problem List   Diagnosis Date Noted  . Unable to ambulate 02/12/2020  . Progressive supranuclear palsy ([place]) 10/12/2019  . Encephalomalacia on imaging study 10/12/2019  . Fluency disorder associated with underlying disease 02/08/2018  . Cognitive and neurobehavioral dysfunction 02/08/2018  . 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 Arthritis of carpometacarpal [facility]) joint of left thumb 01/24/2018  . Ankle fracture, left 09/03/2016  . Hand dysfunction 03/11/2016  . Symptomatic varicose veins, bilateral 03/11/2016  .  Numbness of left thumb 03/11/2016  . Elevated BP 07/27/2014  . Bunion of left foot 07/26/2014  . Abnormality of gait 07/26/2014  . Visit for preventive health examination 07/28/2013  . Right anterior knee pain 07/22/2012  . Medicare annual wellness visit, subsequent 07/25/2011  . ANXIETY, SITUATIONAL 09/25/2009  . ARTHRITIS 09/25/2009  . Vitamin D deficiency 09/27/2008  . Hyperlipidemia 09/17/2008  . Elevated blood pressure reading without diagnosis of hypertension 09/17/2008  . COLONIC POLYPS, HX OF 09/17/2008  . PERSONAL HISTORY DISEASES SKIN&SUBCUT TISSUE 09/17/2008    Narda Bonds, PTA [place] 02/21/20 12:39 PM Phone: [phone] Fax: [facility] [address] [place], [place], [address] Phone: [phone]   Fax:  [phone]  Name: Pamela Alvarez MRN: [id number] Date of Birth: 1940/11/08

## 2020-02-21 NOTE — Therapy (Signed)
[facility] [address] [place], [place], [address] Phone: [phone]   Fax:  [phone]  Speech Language Pathology Treatment  Patient Details  Name: Pamela Alvarez MRN: [id number] Date of Birth: 10-22-1940 Referring Provider (SLP): Dohmeier, Asencion Partridge, MD   Encounter Date: 02/21/2020   End of Session - 02/21/20 1316    Visit Number 11    Number of Visits 17    Date for SLP Re-Evaluation 03/13/20    SLP Start Time 1232    SLP Stop Time  1312    SLP Time Calculation (min) 40 min    Activity Tolerance Patient tolerated treatment well           Past Medical History:  Diagnosis Date  . Cyst 07-2008   Cyst on back x2 that were drained  . H/O echocardiogram 02-2000  . Hx of colonic polyps   . Hyperlipidemia   . Hypertension   . Varicose veins of both lower extremities     Past Surgical History:  Procedure Laterality Date  . POLYPECTOMY     Colon  . TONSILLECTOMY    . TUBAL LIGATION      There were no vitals filed for this visit.   Subjective Assessment - 02/21/20 1236    Subjective "They were actually having a problem with the delete" re: [employer]    Patient is accompained by: Family member   spouse   Currently in Pain? No/denies                 ADULT SLP TREATMENT - 02/21/20 1237      General Information   Behavior/Cognition Alert;Cooperative;Requires cueing      Treatment Provided   Treatment provided Cognitive-Linquistic      Cognitive-Linquistic Treatment   Treatment focused on Cognition;Aphasia;Patient/family/caregiver education    Skilled Treatment Pt arrives Touch Talk, Adella Hare helped fix the problem of not deleting. Pamela Alvarez required consistent tactile cues and verbal cues to ID family members on device and answer questions re: family. She also required consistent visual cues and repetition to role play telling a food order. They forgot the stylus, which does improve accuracy.  Encouraged Pamela Alvarez to repeat after the device to practice speech      Assessment / Recommendations / [place] with current plan of care      Progression Toward Goals   Progression toward goals Progressing toward goals            SLP Education - 02/21/20 1315    Education Details add restaurants    Person(s) Educated Patient;Spouse    Methods Explanation;Demonstration;Verbal cues    Comprehension Verbalized understanding;Returned demonstration;Verbal cues required;Need further instruction            SLP Short Term Goals - 02/21/20 1316      SLP SHORT TERM GOAL #1   Title pt will relay simple biographical information using AAC if necessary with mod cues x 3 visits.    Baseline 12/14/19    Time 1    Period Weeks    Status Partially Met      SLP SHORT TERM GOAL #2   Title pt will respond to simple questions re: preferences, using AAC if necessary, with mod cues x3 visits.    Baseline 12/12/19 12/14/19, 01/09/20    Time 2    Period Weeks    Status Achieved      SLP SHORT TERM GOAL #3   Title Pt will ask an appropriate question  in simple communication exchange, using AAC if necessary, in 2 sessions.    Baseline 12/14/19    Time 2    Period Weeks    Status Partially Met            SLP Long Term Goals - 02/21/20 1316      SLP LONG TERM GOAL #1   Title pt/spouse will report improved communication abilities than prior to beginning ST    Time 3    Period Weeks    Status On-going      SLP LONG TERM GOAL #2   Title Pt/spouse will report using AAC/supportive conversation strategies to resolve communication breakdown outside Pamela Alvarez x 3 visits  with mod caregiver assistance    Baseline 12/14/19    Time 3    Period Weeks    Status On-going      SLP LONG TERM GOAL #3   Title Pt will make simple requests, using AAC if necessary, outside [place] room x 3 sessions with mod caregiver assistance    Baseline 12/14/19    Time 3   renewed 11/24   Period Weeks    Status On-going    omitted "over three sessions"     SLP LONG TERM GOAL #4   Title Pt will use AAC 3 in a simple social exchange (phone or in person gathering) with mod cues from spouse, x 3 sessions.    Time --   renewed 11/24     SLP LONG TERM GOAL #5   Time --   renewed 11/24           Plan - 02/21/20 1315    Clinical Impression Statement Pamela Alvarez continues to present with aphasia and cognitive deficits secondary to PSP, recently diagnosed. She has received [employer] Touch Talk to aid communication. Ongoing traing of spouse in prgramming and assisting pt in using device, and training Pamela Alvarez on communicating with device. Their goals are for Pamela Alvarez to particiate in conversation with friends and in [place] appointment.    Speech Therapy Frequency 2x / week    Duration 8 weeks   17 visits   Treatment/Interventions SLP instruction and feedback;Compensatory strategies;Patient/family education;Multimodal communcation approach;Cueing hierarchy;Language facilitation;Functional tasks;Oral motor exercises;Internal/external aids;Environmental controls;Cognitive reorganization    Potential to Achieve Goals Fair    Potential Considerations Severity of impairments           Patient will benefit from skilled therapeutic intervention in order to improve the following deficits and impairments:   Verbal apraxia  Cognitive communication deficit    Problem List Patient Active Problem List   Diagnosis Date Noted  . Unable to ambulate 02/12/2020  . Progressive supranuclear palsy ([place]) 10/12/2019  . Encephalomalacia on imaging study 10/12/2019  . Fluency disorder associated with underlying disease 02/08/2018  . Cognitive and neurobehavioral dysfunction 02/08/2018  . 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 Arthritis of carpometacarpal [facility]) joint of left thumb 01/24/2018  . Ankle fracture, left 09/03/2016  . Hand dysfunction 03/11/2016  . Symptomatic varicose veins, bilateral 03/11/2016  . Numbness of  left thumb 03/11/2016  . Elevated BP 07/27/2014  . Bunion of left foot 07/26/2014  . Abnormality of gait 07/26/2014  . Visit for preventive health examination 07/28/2013  . Right anterior knee pain 07/22/2012  . Medicare annual wellness visit, subsequent 07/25/2011  . ANXIETY, SITUATIONAL 09/25/2009  . ARTHRITIS 09/25/2009  . Vitamin D deficiency 09/27/2008  . Hyperlipidemia 09/17/2008  . Elevated blood pressure reading without diagnosis of hypertension 09/17/2008  . COLONIC POLYPS,  HX OF 09/17/2008  . PERSONAL HISTORY DISEASES SKIN&SUBCUT TISSUE 09/17/2008    Marguerite Barba, Annye Rusk MS, CCC-SLP 02/21/2020, 1:17 PM  [facility] [address] [place], [place], [address] Phone: [phone]   Fax:  [phone]   Name: Pamela Alvarez MRN: [id number] Date of Birth: 05-Aug-1940

## 2020-02-27 ENCOUNTER — Other Ambulatory Visit: Payer: Self-pay

## 2020-02-27 ENCOUNTER — Ambulatory Visit (INDEPENDENT_AMBULATORY_CARE_PROVIDER_SITE_OTHER): Payer: Medicare PPO

## 2020-02-27 DIAGNOSIS — Z638 Other specified problems related to primary support group: Secondary | ICD-10-CM

## 2020-02-27 DIAGNOSIS — Z0000 Encounter for general adult medical examination without abnormal findings: Secondary | ICD-10-CM | POA: Diagnosis not present

## 2020-02-27 NOTE — Patient Instructions (Signed)
Pamela Alvarez , Thank you for taking time to come for your Medicare Wellness Visit. I appreciate your ongoing commitment to your health goals. Please review the following plan we discussed and let me know if I can assist you in the future.   Screening recommendations/referrals: Colonoscopy: No longer required Mammogram: No longer required Bone Density: Declined  Recommended yearly ophthalmology/optometry visit for glaucoma screening and checkup Recommended yearly dental visit for hygiene and checkup  Vaccinations: Influenza vaccine: Done 11/29/19 Up to date Pneumococcal vaccine: Up to date Tdap vaccine: Declined and discussed Shingles vaccine: Shingrix discussed. Please contact your pharmacy for coverage information.    Covid-19:Completed 2/4 & 05/08/19  Advanced directives: Advance directive discussed with you today. Even though you declined this today please call our office should you change your mind and we can give you the proper paperwork for you to fill out.  Conditions/risks identified: None at this time  Next appointment: Follow up in one year for your annual wellness visit    Preventive Care 65 Years and Older, Female Preventive care refers to lifestyle choices and visits with your health care provider that can promote health and wellness. What does preventive care include?  A yearly physical exam. This is also called an annual well check.  Dental exams once or twice a year.  Routine eye exams. Ask your health care provider how often you should have your eyes checked.  Personal lifestyle choices, including:  Daily care of your teeth and gums.  Regular physical activity.  Eating a healthy diet.  Avoiding tobacco and drug use.  Limiting alcohol use.  Practicing safe sex.  Taking low-dose aspirin every day.  Taking vitamin and mineral supplements as recommended by your health care provider. What happens during an annual well check? The services and screenings done by  your health care provider during your annual well check will depend on your age, overall health, lifestyle risk factors, and family history of disease. Counseling  Your health care provider may ask you questions about your:  Alcohol use.  Tobacco use.  Drug use.  Emotional well-being.  Home and relationship well-being.  Sexual activity.  Eating habits.  History of falls.  Memory and ability to understand (cognition).  Work and work Astronomer.  Reproductive health. Screening  You may have the following tests or measurements:  Height, weight, and BMI.  Blood pressure.  Lipid and cholesterol levels. These may be checked every 5 years, or more frequently if you are over 57 years old.  Skin check.  Lung cancer screening. You may have this screening every year starting at age 83 if you have a 30-pack-year history of smoking and currently smoke or have quit within the past 15 years.  Fecal occult blood test (FOBT) of the stool. You may have this test every year starting at age 75.  Flexible sigmoidoscopy or colonoscopy. You may have a sigmoidoscopy every 5 years or a colonoscopy every 10 years starting at age 10.  Hepatitis C blood test.  Hepatitis B blood test.  Sexually transmitted disease (STD) testing.  Diabetes screening. This is done by checking your blood sugar (glucose) after you have not eaten for a while (fasting). You may have this done every 1-3 years.  Bone density scan. This is done to screen for osteoporosis. You may have this done starting at age 51.  Mammogram. This may be done every 1-2 years. Talk to your health care provider about how often you should have regular mammograms. Talk with your  health care provider about your test results, treatment options, and if necessary, the need for more tests. Vaccines  Your health care provider may recommend certain vaccines, such as:  Influenza vaccine. This is recommended every year.  Tetanus,  diphtheria, and acellular pertussis (Tdap, Td) vaccine. You may need a Td booster every 10 years.  Zoster vaccine. You may need this after age 58.  Pneumococcal 13-valent conjugate (PCV13) vaccine. One dose is recommended after age 55.  Pneumococcal polysaccharide (PPSV23) vaccine. One dose is recommended after age 17. Talk to your health care provider about which screenings and vaccines you need and how often you need them. This information is not intended to replace advice given to you by your health care provider. Make sure you discuss any questions you have with your health care provider. Document Released: 03/22/2015 Document Revised: 11/13/2015 Document Reviewed: 12/25/2014 Elsevier Interactive Patient Education  2017 [employer] Prevention in the Home Falls can cause injuries. They can happen to people of all ages. There are many things you can do to make your home safe and to help prevent falls. What can I do on the outside of my home?  Regularly fix the edges of walkways and driveways and fix any cracks.  Remove anything that might make you trip as you walk through a door, such as a raised step or threshold.  Trim any bushes or trees on the path to your home.  Use bright outdoor lighting.  Clear any walking paths of anything that might make someone trip, such as rocks or tools.  Regularly check to see if handrails are loose or broken. Make sure that both sides of any steps have handrails.  Any raised decks and porches should have guardrails on the edges.  Have any leaves, snow, or ice cleared regularly.  Use sand or salt on walking paths during winter.  Clean up any spills in your garage right away. This includes oil or grease spills. What can I do in the bathroom?  Use night lights.  Install grab bars by the toilet and in the tub and shower. Do not use towel bars as grab bars.  Use non-skid mats or decals in the tub or shower.  If you need to sit down in  the shower, use a plastic, non-slip stool.  Keep the floor dry. Clean up any water that spills on the floor as soon as it happens.  Remove soap buildup in the tub or shower regularly.  Attach bath mats securely with double-sided non-slip rug tape.  Do not have throw rugs and other things on the floor that can make you trip. What can I do in the bedroom?  Use night lights.  Make sure that you have a light by your bed that is easy to reach.  Do not use any sheets or blankets that are too big for your bed. They should not hang down onto the floor.  Have a firm chair that has side arms. You can use this for support while you get dressed.  Do not have throw rugs and other things on the floor that can make you trip. What can I do in the kitchen?  Clean up any spills right away.  Avoid walking on wet floors.  Keep items that you use a lot in easy-to-reach places.  If you need to reach something above you, use a strong step stool that has a grab bar.  Keep electrical cords out of the way.  Do not use  floor polish or wax that makes floors slippery. If you must use wax, use non-skid floor wax.  Do not have throw rugs and other things on the floor that can make you trip. What can I do with my stairs?  Do not leave any items on the stairs.  Make sure that there are handrails on both sides of the stairs and use them. Fix handrails that are broken or loose. Make sure that handrails are as long as the stairways.  Check any carpeting to make sure that it is firmly attached to the stairs. Fix any carpet that is loose or worn.  Avoid having throw rugs at the top or bottom of the stairs. If you do have throw rugs, attach them to the floor with carpet tape.  Make sure that you have a light switch at the top of the stairs and the bottom of the stairs. If you do not have them, ask someone to add them for you. What else can I do to help prevent falls?  Wear shoes that:  Do not have high  heels.  Have rubber bottoms.  Are comfortable and fit you well.  Are closed at the toe. Do not wear sandals.  If you use a stepladder:  Make sure that it is fully opened. Do not climb a closed stepladder.  Make sure that both sides of the stepladder are locked into place.  Ask someone to hold it for you, if possible.  Clearly mark and make sure that you can see:  Any grab bars or handrails.  First and last steps.  Where the edge of each step is.  Use tools that help you move around (mobility aids) if they are needed. These include:  Canes.  Walkers.  Scooters.  Crutches.  Turn on the lights when you go into a dark area. Replace any light bulbs as soon as they burn out.  Set up your furniture so you have a clear path. Avoid moving your furniture around.  If any of your floors are uneven, fix them.  If there are any pets around you, be aware of where they are.  Review your medicines with your doctor. Some medicines can make you feel dizzy. This can increase your chance of falling. Ask your doctor what other things that you can do to help prevent falls. This information is not intended to replace advice given to you by your health care provider. Make sure you discuss any questions you have with your health care provider. Document Released: 12/20/2008 Document Revised: 08/01/2015 Document Reviewed: 03/30/2014 Elsevier Interactive Patient Education  2017 [employer].

## 2020-02-27 NOTE — Progress Notes (Addendum)
Virtual Visit via Telephone Note  I connected with  Lorie ApleySandra Blew on 02/27/20 at 11:45 AM EST by telephone and verified that I am speaking with the correct person using two identifiers. Husband Lourdes SledgeChuck, Charles morris provided information  Location: Patient: Home Provider: Office Persons participating in the virtual visit: patient/Nurse Health Advisor   I discussed the limitations, risks, security and privacy concerns of performing an evaluation and management service by telephone and the availability of in person appointments. The patient expressed understanding and agreed to proceed.  Interactive audio and video telecommunications were attempted between this nurse and patient, however failed, due to patient having technical difficulties OR patient did not have access to video capability.  We continued and completed visit with audio only.  Some vital signs may be absent or patient reported.   Marzella Schleinina H Sreenidhi Ganson, LPN    Subjective:   Lorie ApleySandra Fearnow is a 79 y.o. female who presents for Medicare Annual (Subsequent) preventive examination.  Review of Systems     Cardiac Risk Factors include: advanced age (>5655men, 67>65 women);hypertension;dyslipidemia     Objective:    There were no vitals filed for this visit. There is no height or weight on file to calculate BMI.  Advanced Directives 02/27/2020 11/22/2018 11/22/2018 02/01/2018 04/28/2016 12/09/2015 10/22/2015  Does Patient Have a Medical Advance Directive? No No No No No No No  Would patient like information on creating a medical advance directive? No - Patient declined No - Patient declined No - Patient declined No - Patient declined - No - patient declined information Yes - Educational materials given    Current Medications (verified) Outpatient Encounter Medications as of 02/27/2020  Medication Sig  . apixaban (ELIQUIS) 5 MG TABS tablet Take 1 tablet (5 mg total) by mouth 2 (two) times daily. For DVT  . atorvastatin (LIPITOR) 10 MG tablet  TAKE 1 TABLET BY MOUTH EVERY DAY  . Cholecalciferol (VITAMIN D3 PO) Take 1 tablet by mouth daily.  Marland Kitchen. losartan (COZAAR) 25 MG tablet Take 25 mg by mouth daily.  . vitamin B-12 (CYANOCOBALAMIN) 1000 MCG tablet Take 1,000 mcg by mouth 3 (three) times a week.   No facility-administered encounter medications on file as of 02/27/2020.    Allergies (verified) Dust mite extract and Sulfonamide derivatives   History: Past Medical History:  Diagnosis Date  . Cyst 07-2008   Cyst on back x2 that were drained  . H/O echocardiogram 02-2000  . Hx of colonic polyps   . Hyperlipidemia   . Hypertension   . Varicose veins of both lower extremities    Past Surgical History:  Procedure Laterality Date  . POLYPECTOMY     Colon  . TONSILLECTOMY    . TUBAL LIGATION     Family History  Problem Relation Age of Onset  . Hypertension Mother   . Parkinsonism Mother   . Angina Father   . Heart disease Father   . Colon cancer Father   . Lung cancer Father   . Skin cancer Father   . Cancer Father        Heart  . Breast cancer Sister        over [id number]  . Diabetes Sister   . Heart disease Brother 955       triple bypass surgery  . Macular degeneration Sister   . Diabetes Sister        prediabetic  . Hearing loss Sister        hearing problems  . Vasculitis Sister  Social History   Socioeconomic History  . Marital status: Married    Spouse name: [name]  . Number of children: 2  . Years of education: Not on file  . Highest education level: Doctorate  Occupational History  . Occupation: retired    Comment: Social research officer, government [name]  Tobacco Use  . Smoking status: Never Smoker  . Smokeless tobacco: Never Used  Substance and Sexual Activity  . Alcohol use: Yes    Alcohol/week: 7.0 standard drinks    Types: 7 Glasses of wine per week    Comment: one a day  . Drug use: Not Currently  . Sexual activity: Not on file  Other Topics Concern  . Not on file  Social History Narrative   Regular  exercise-yes   Moved back to [place] from [place] of 2 cat   G3 P2    Exercises walking regu;arly    Retired  Social research officer, government   Drinks one cup of coffee  A day. In addition to walking QD she and her husband take a pilates class on Thursdays. She lives with her husband in a 2 story house, though the master bedroom in on the first level.       Social Determinants of Health   Financial Resource Strain: Low Risk   . Difficulty of Paying Living Expenses: Not hard at all  Food Insecurity: No Food Insecurity  . Worried About Programme researcher, broadcasting/film/video in the Last Year: Never true  . Ran Out of Food in the Last Year: Never true  Transportation Needs: No Transportation Needs  . Lack of Transportation (Medical): No  . Lack of Transportation (Non-Medical): No  Physical Activity: Insufficiently Active  . Days of Exercise per Week: 3 days  . Minutes of Exercise per Session: 40 min  Stress: No Stress Concern Present  . Feeling of Stress : Not at all  Social Connections: Moderately Isolated  . Frequency of Communication with Friends and Family: Never  . Frequency of Social Gatherings with Friends and Family: More than three times a week  . Attends Religious Services: Never  . Active Member of Clubs or Organizations: No  . Attends Banker Meetings: Never  . Marital Status: Married    Tobacco Counseling Counseling given: Not Answered   Clinical Intake:  Pre-visit preparation completed: Yes        BMI - recorded: 27.25 Nutritional Status: BMI 25 -29 Overweight Nutritional Risks: None Diabetes: No  How often do you need to have someone help you when you read instructions, pamphlets, or other written materials from your doctor or pharmacy?: 1 - Never  Diabetic?No  Interpreter Needed?: No  Information entered by :: Lanier Ensign, LPN   Activities of Daily Living In your present state of health, do you have any difficulty performing the following activities: 02/27/2020   Hearing? N  Vision? N  Difficulty concentrating or making decisions? Y  Comment seeing a neurology  Walking or climbing stairs? Y  Comment would need assistance  Dressing or bathing? Y  Doing errands, shopping? Y  Preparing Food and eating ? Y  Using the Toilet? Y  In the past six months, have you accidently leaked urine? N  Do you have problems with loss of bowel control? N  Managing your Medications? Y  Managing your Finances? N  Housekeeping or managing your Housekeeping? Y  Some recent data might be hidden    Patient Care Team: Panosh, Neta Mends, MD as PCP -  Benay Pike, MD (Gastroenterology) Elise Benne, MD (Ophthalmology) Venancio Poisson, MD as Attending Physician (Dermatology) Enid Baas, MD as Consulting Physician (Sports Medicine) Teryl Lucy, MD as Consulting Physician (Orthopedic Surgery) Andrena Mews, DO as Consulting Physician (Sports Medicine) Sedalia Muta, PT as Physical Therapist (Physical Therapy) Dohmeier, Porfirio Mylar, MD as Consulting Physician (Neurology)  Indicate any recent Medical Services you may have received from other than [facility] providers in the past year (date may be approximate).     Assessment:   This is a routine wellness examination for Baleria.  Hearing/Vision screen  Hearing Screening   125Hz  250Hz  500Hz  1000Hz  2000Hz  3000Hz  4000Hz  6000Hz  8000Hz   Right ear:           Left ear:           Comments: Denies hearing issues  Vision Screening Comments: Will follow up with Dr for annual eye exams  Dietary issues and exercise activities discussed: Current Exercise Habits: Structured exercise class, Type of exercise: walking;stretching, Time (Minutes): 45, Frequency (Times/Week): 3, Weekly Exercise (Minutes/Week): 135  Goals    . Patient Stated     None at this time       Depression Screen PHQ 2/9 Scores 02/27/2020 01/17/2020 12/18/2016 12/09/2015 07/27/2014 07/26/2014 07/24/2013  PHQ - 2 Score 0 0 0 - 0 0 0  Exception  Documentation - - - Patient refusal - Other- indicate reason in comment box -    Fall Risk Fall Risk  02/27/2020 03/27/2019 12/23/2018 04/12/2018 02/08/2018  Falls in the past year? 1 1 1 1 1   Number falls in past yr: 1 1 1 1 1   Comment - - 10 pt husband reports - -  Injury with Fall? 0 0 1 0 0  Risk Factor Category  - - - - -  Risk for fall due to : Impaired balance/gait;Impaired mobility;Impaired vision;History of fall(s) - History of fall(s) Impaired balance/gait Impaired mobility;History of fall(s)  Follow up Falls prevention discussed - Falls evaluation completed Education provided -    FALL RISK PREVENTION PERTAINING TO THE HOME:  Any stairs in or around the home? Yes  If so, are there any without handrails? No  Home free of loose throw rugs in walkways, pet beds, electrical cords, etc? Yes  Adequate lighting in your home to reduce risk of falls? Yes   ASSISTIVE DEVICES UTILIZED TO PREVENT FALLS:  Life alert? Yes  Use of a cane, walker or w/c? Yes  Grab bars in the bathroom? Yes  Shower chair or bench in shower? Yes  Elevated toilet seat or a handicapped toilet? Yes   TIMED UP AND GO:  Was the test performed? No .  .     Cognitive Function: Declined due to condition / follows a neurologist MMSE - Mini Mental State Exam 10/12/2019 03/27/2019 02/08/2018  Orientation to time 0 2 1  Orientation to Place 0 2 5  Registration 3 3 3   Attention/ Calculation 0 0 0  Recall 2 2 1   Language- name 2 objects 1 2 2   Language- repeat 1 1 0  Language- follow 3 step command 2 3 3   Language- read & follow direction 0 0 1  Write a sentence 0 0 0  Copy design 0 0 0  Total score 9 15 16    Montreal Cognitive Assessment  04/28/2017  Visuospatial/ Executive (0/5) 0  Naming (0/3) 2  Attention: Read list of digits (0/2) 1  Attention: Read list of letters (0/1) 0  Attention: Serial 7 subtraction  starting at 100 (0/3) 0  Language: Repeat phrase (0/2) 2  Language : Fluency (0/1) 0   Abstraction (0/2) 2  Delayed Recall (0/5) 0  Orientation (0/6) 6  Total 13      Immunizations Immunization History  Administered Date(s) Administered  . Fluad Quad(high Dose 65+) 11/25/2018, 11/29/2019  . Influenza Split 12/08/2010, 12/31/2011  . Influenza Whole 01/03/2010  . Influenza, High Dose Seasonal PF 12/09/2015, 12/18/2016, 01/10/2018  . Influenza,inj,Quad PF,6+ Mos 11/16/2012, 01/30/2014, 01/04/2015, 12/23/2018  . PFIZER SARS-COV-2 Vaccination 04/13/2019, 05/08/2019  . Pneumococcal Conjugate-13 07/24/2013  . Pneumococcal Polysaccharide-23 09/17/2008  . Td 02/07/2000, 07/07/2008  . Zoster 07/22/2011    TDAP status: Due, Education has been provided regarding the importance of this vaccine. Advised may receive this vaccine at local pharmacy or Health Dept. Aware to provide a copy of the vaccination record if obtained from local pharmacy or Health Dept. Verbalized acceptance and understanding.  Flu Vaccine status: Up to date  Done 11/29/19 Pneumococcal vaccine status: Up to date  Covid-19 vaccine status: Completed vaccines  Qualifies for Shingles Vaccine? Yes   Zostavax completed No   Shingrix Completed?: No.    Education has been provided regarding the importance of this vaccine. Patient has been advised to call insurance company to determine out of pocket expense if they have not yet received this vaccine. Advised may also receive vaccine at local pharmacy or Health Dept. Verbalized acceptance and understanding.  Screening Tests Health Maintenance  Topic Date Due  . COVID-19 Vaccine (3 - Booster for Pfizer series) 11/08/2019  . TETANUS/TDAP  02/26/2021 (Originally 07/08/2018)  . INFLUENZA VACCINE  Completed  . PNA vac Low Risk Adult  Completed  . DEXA SCAN  Discontinued    Health Maintenance  Health Maintenance Due  Topic Date Due  . COVID-19 Vaccine (3 - Booster for Pfizer series) 11/08/2019    Colorectal cancer screening: No longer required.   Mammogram  status: No longer required due to age.  Bone density:declined    Additional Screening:   Vision Screening: Recommended annual ophthalmology exams for early detection of glaucoma and other disorders of the eye. Is the patient up to date with their annual eye exam?  Yes  Who is the provider or what is the name of the office in which the patient attends annual eye exams? Dr Dione Booze    Dental Screening: Recommended annual dental exams for proper oral hygiene  Community Resource Referral / Chronic Care Management: CRR required this visit?  No   CCM required this visit?  Yes      Plan:     I have personally reviewed and noted the following in the patient's chart:   . Medical and social history . Use of alcohol, tobacco or illicit drugs  . Current medications and supplements . Functional ability and status . Nutritional status . Physical activity . Advanced directives . List of other physicians . Hospitalizations, surgeries, and ER visits in previous 12 months . Vitals . Screenings to include cognitive, depression, and falls . Referrals and appointments  In addition, I have reviewed and discussed with patient certain preventive protocols, quality metrics, and best practice recommendations. A written personalized care plan for preventive services as well as general preventive health recommendations were provided to patient.     Marzella Schlein, LPN   [id number]   Nurse Notes: None

## 2020-03-06 ENCOUNTER — Other Ambulatory Visit: Payer: Self-pay

## 2020-03-06 ENCOUNTER — Encounter: Payer: Self-pay | Admitting: Physical Therapy

## 2020-03-06 ENCOUNTER — Ambulatory Visit: Payer: Medicare PPO | Admitting: Speech Pathology

## 2020-03-06 ENCOUNTER — Encounter: Payer: Self-pay | Admitting: Speech Pathology

## 2020-03-06 ENCOUNTER — Ambulatory Visit: Payer: Medicare PPO | Admitting: Physical Therapy

## 2020-03-06 DIAGNOSIS — R2681 Unsteadiness on feet: Secondary | ICD-10-CM | POA: Diagnosis not present

## 2020-03-06 DIAGNOSIS — R2689 Other abnormalities of gait and mobility: Secondary | ICD-10-CM

## 2020-03-06 DIAGNOSIS — M6281 Muscle weakness (generalized): Secondary | ICD-10-CM

## 2020-03-06 DIAGNOSIS — R4701 Aphasia: Secondary | ICD-10-CM | POA: Diagnosis not present

## 2020-03-06 DIAGNOSIS — R29818 Other symptoms and signs involving the nervous system: Secondary | ICD-10-CM | POA: Diagnosis not present

## 2020-03-06 DIAGNOSIS — R482 Apraxia: Secondary | ICD-10-CM | POA: Diagnosis not present

## 2020-03-06 DIAGNOSIS — R41841 Cognitive communication deficit: Secondary | ICD-10-CM | POA: Diagnosis not present

## 2020-03-06 NOTE — Patient Instructions (Signed)
Access Code: [email or web address] URL: [email or web address] Date: 03/06/2020 Prepared by: Lonia Blood  Exercises Seated Hamstring Stretch - 1 x daily - 5 x weekly - 1 sets - 3 reps - 15-30 sec hold Supine Ankle Dorsiflexion Stretch with Caregiver - 1-2 x daily - 5 x weekly - 1 sets - 3 reps - 15-30 sec hold

## 2020-03-06 NOTE — Therapy (Signed)
[facility] [address] [place], [place], [address] Phone: [phone]   Fax:  [phone]  Physical Therapy Treatment/PROGRESS NOTE/Recert NOTE  Patient Details  Name: Pamela Alvarez MRN: [id number] Date of Birth: May 25, 1940 Referring Provider (PT): Dohmeier, Asencion Partridge   Encounter Date: 03/06/2020   PT End of Session - 03/06/20 1201    Visit Number 21    Number of Visits 25   per recert 21/22/4825   Date for PT Re-Evaluation 00/37/04   90 day recert, to capture w/c training that may be needed upon receipt of new power wheelchair   Authorization Type [employer]-submitted auth at completion of eval; new auth request for 10 additional visits requested after visit 12/21/2019; request for additional visits for w/c assessment/training, submitted after 03/06/2020 visit    Authorization Time Period 10/21-12/30/21 for 10 visits    Authorization - Visit Number 9    Authorization - Number of Visits 10    PT Start Time 1103    PT Stop Time 1145    PT Time Calculation (min) 42 min    Equipment Utilized During Treatment Gait belt    Activity Tolerance Patient tolerated treatment well;Patient limited by lethargy    Behavior During Therapy Flat affect;WFL for tasks assessed/performed   decreased safety awareness          Past Medical History:  Diagnosis Date  . Cyst 07-2008   Cyst on back x2 that were drained  . H/O echocardiogram 02-2000  . Hx of colonic polyps   . Hyperlipidemia   . Hypertension   . Varicose veins of both lower extremities     Past Surgical History:  Procedure Laterality Date  . POLYPECTOMY     Colon  . TONSILLECTOMY    . TUBAL LIGATION      There were no vitals filed for this visit.   Subjective Assessment - 03/06/20 1106    Subjective Have someone starting that will be helping out at home 3 days a week and helping to do exercises with pt at home.    Patient is accompained by: Family member    Husband-provides history   Patient Stated Goals To try to maintain as much as possible, try to prevent regression as much as possible.    Currently in Pain? No/denies              [facility] PT Assessment - 03/06/20 0001      Strength   Overall Strength Deficits    Overall Strength Comments Difficulty following directions for MMT, grossly 3-/5to 3/5 for UE's    Strength Assessment Site Shoulder;Elbow;Hip;Knee;Ankle    Right/Left Shoulder Right;Left    Right Shoulder Flexion 3-/5    Right Shoulder Horizontal ABduction 3-/5    Left Shoulder Flexion 3-/5    Left Shoulder ABduction 3-/5    Right/Left Elbow Right;Left    Right Elbow Flexion 4/5    Right Elbow Extension 4/5    Left Elbow Flexion 3-/5    Left Elbow Extension 3-/5    Right/Left Hip Right;Left    Right Hip Flexion 3+/5    Left Hip Flexion 3-/5    Right/Left Knee Right;Left    Right Knee Flexion 3/5    Right Knee Extension 3+/5    Left Knee Flexion 3/5    Left Knee Extension 3/5    Right/Left Ankle Right;Left    Right Ankle Dorsiflexion 3-/5    Left Ankle Dorsiflexion 3-/5  [facility] Adult PT Treatment/Exercise - 03/06/20 0001      Transfers   Transfers Sit to Stand;Stand to Sit    Sit to Stand 4: Min assist;3: Mod assist    Sit to Stand Details Tactile cues for initiation;Tactile cues for sequencing;Tactile cues for placement;Verbal cues for technique;Verbal cues for precautions/safety;Manual facilitation for weight shifting;Manual facilitation for placement    Stand to Sit 3: Mod assist    Stand to Sit Details (indicate cue type and reason) Tactile cues for initiation;Tactile cues for sequencing;Manual facilitation for weight shifting;Manual facilitation for weight bearing      Ambulation/Gait   Ambulation/Gait Yes    Ambulation/Gait Assistance 4: Min assist;3: Mod assist    Ambulation/Gait Assistance Details Using rollator, PT provides assist at rollator to slow it down and to  help pt stay close to rollator.    Ambulation Distance (Feet) 90 Feet    Assistive device Rollator    Gait Pattern Step-to pattern;Decreased step length - right;Decreased step length - left;Decreased stride length;Decreased hip/knee flexion - right;Decreased hip/knee flexion - left;Decreased dorsiflexion - right;Decreased dorsiflexion - left;Decreased weight shift to right;Decreased weight shift to left;Poor foot clearance - left;Poor foot clearance - right    Ambulation Surface Level;Indoor    Gait velocity 49.66 sec = 0.66 ft/sec    Gait Comments PT provides cues to husband for sequencing and safety, as he will need to educate new caregiver in use of rollator for short distances at home.      Self-Care   Self-Care Other Self-Care Comments    Other Self-Care Comments  Discussed POC and pt's continued decreased safety awareness, decreased strength and balance.  Discussed ways to incorporate caregivers in exercises and stretches.  Discussed upcoming power (with caregiver assisted controls) wheelchair assessment next week.           Access Code: [place] URL: [email or web address] Date: 03/06/2020 Prepared by: Mady Haagensen  Exercises Seated Hamstring Stretch - 1 x daily - 5 x weekly - 1 sets - 3 reps - 15-30 sec hold Supine Ankle Dorsiflexion Stretch with Caregiver - 1-2 x daily - 5 x weekly - 1 sets - 3 reps - 15-30 sec hold         PT Education - 03/06/20 1200    Education Details Stretches added for HEP; POC and likely 1-2 additional visits after w/c assessment if needed for w/c training follow-up    Person(s) Educated Patient    Methods Explanation;Demonstration;Verbal cues;Tactile cues;Handout   needs assistance   Comprehension Verbalized understanding;Verbal cues required;Returned demonstration;Tactile cues required            PT Short Term Goals - 02/14/20 1201      PT SHORT TERM GOAL #1   Title Pt will perform at least 4 minutes of continuous gait  activity with rollator, min assist, for improved endurance for gait in the home.  TARGET 02/16/2020    Baseline able to ambulate 150' in 4 minutes 50 seconds with rollator and min assist on 02/14/20    Time 4    Period Weeks    Status Achieved      PT SHORT TERM GOAL #2   Title Pt will perform 10 reps of sit<>stand with minimal assistance for improved transfer efficiency and functional strength.    Baseline 7 reps with min-mod assist and posterior lean on 02/14/20    Time 4    Period Weeks    Status Not Met      PT SHORT TERM GOAL #3  Title Pt will improve TUG score to less than or equal to 60 seconds for decreased fall risk.    Baseline 02/14/20 95 seconds with HHA of PTA with pt's hands on PTA arms and PTA walking backwards    Time 4    Period Weeks    Status Not Met             PT Long Term Goals - 03/06/20 1203      PT LONG TERM GOAL #1   Title Pt/husband will verbalize and demonstrate understanding of final HEP to maintain functional gains made in therapy.  TARGET 03/01/2020    Baseline Husband verbalizes understanding of updates provided    Time 6    Period Weeks    Status Achieved      PT LONG TERM GOAL #2   Title Pt will perform TUG shuttle activity with rollator and min guard, at least 3 sets without break, to demonstrate improved transfer efficiency and safety.    Baseline Not safe for pt to perform at home    Time 6    Period Weeks    Status Not Met      PT LONG TERM GOAL #3   Title Pt will improve TUG score to less than or equal to 45 seconds for decreased fall risk/improved mobility in the home.    Baseline pt at last check was 95 seconds, which is increasing    Time 6    Period Weeks    Status Not Met      PT LONG TERM GOAL #4   Title Pt/husband will verbalize understanding of fall prevention in home environment.    Baseline Have discussed and demo throughout therapy    Time 6    Period Weeks    Status Achieved      PT LONG TERM GOAL #5   Title Pt  will improve gait velocity to at least 1.1 ft/sec for improved gait efficiency and safety in the home.    Baseline 0.66 ft/sec    Time 6    Period Weeks    Status Not Met           Updated goals for recert:   PT Long Term Goals - 03/06/20 1214      PT LONG TERM GOAL #1   Title Pt will participate in power w/c assessment with attendant controls, for improved safety within home environment.  TARGET 05/03/2020 (date extended due to awaiting power w/c arrival)    Time 4    Period Weeks    Status New      PT LONG TERM GOAL #2   Title Husband will demo understanding of attendant controls for power wheelchair, including for mobility and transfers.    Time 4    Period Weeks    Status New                Plan - 03/06/20 1205    Clinical Impression Statement PROGRESS NOTE/RECERT:  covering dates 12/14/2019-03/06/2020.  Pt's husband reports pt is tired and is ready to finish OP therapies.  He reports one fall in the past month due to posterior LOB.  Pt and husband aware that they will be having w/c assessment next visit, with w/c vendor present.  Assessed LTGs this visit, with pt meeting 2 of 5 goals.  Pt has met LTG 1 and 4(husband verbalizes understanding).  LTG 2, 3, 5 not met, with pt noting significantly slowed functional mobility measures.  As she  continues to have slowed mobility measures, needs assistance for transfer and mobility due to decreased safety awareness, decreased strength, and decreased balance, she is at significant fall risk with one recent fall at home.  PT has recommended and pt is scheduled for power wheelchair assessment (with attendant controls).  Recert completed to address power wheelchair assessment and training as needed.    Personal Factors and Comorbidities Comorbidity 3+;Other    Comorbidities See problem list; recent diagnosis of PSP    Examination-Activity Limitations Locomotion Level;Transfers;Dressing;Stairs;Stand;Toileting    Examination-Participation  Restrictions Community Activity;Shop    Stability/Clinical Decision Making Evolving/Moderate complexity    Rehab Potential Good    PT Frequency 1x / week    PT Duration 4 weeks   per recert 52/48/1859; may be delayed due to awaiting w/c recommendations and receipt of chair   PT Treatment/Interventions ADLs/Self Care Home Management;Gait training;Functional mobility training;Therapeutic activities;Therapeutic exercise;Balance training;Neuromuscular re-education;Patient/family education;Wheelchair mobility training    PT Next Visit Plan Power w/c assessment with vendor present on 0/11/3110; recert completed today    Consulted and Agree with Plan of Care Patient;Family member/caregiver    Family Member Consulted Husband           Patient will benefit from skilled therapeutic intervention in order to improve the following deficits and impairments:  Abnormal gait,Difficulty walking,Decreased safety awareness,Decreased endurance,Decreased activity tolerance,Decreased balance,Decreased mobility,Decreased strength,Postural dysfunction  Visit Diagnosis: Unsteadiness on feet  Muscle weakness (generalized)  Other abnormalities of gait and mobility  Other symptoms and signs involving the nervous system     Problem List Patient Active Problem List   Diagnosis Date Noted  . Unable to ambulate 02/12/2020  . Progressive supranuclear palsy ([place]) 10/12/2019  . Encephalomalacia on imaging study 10/12/2019  . Fluency disorder associated with underlying disease 02/08/2018  . Cognitive and neurobehavioral dysfunction 02/08/2018  . 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 Arthritis of carpometacarpal [facility]) joint of left thumb 01/24/2018  . Ankle fracture, left 09/03/2016  . Hand dysfunction 03/11/2016  . Symptomatic varicose veins, bilateral 03/11/2016  . Numbness of left thumb 03/11/2016  . Elevated BP 07/27/2014  . Bunion of left foot 07/26/2014  . Abnormality of gait 07/26/2014  . Visit for  preventive health examination 07/28/2013  . Right anterior knee pain 07/22/2012  . Medicare annual wellness visit, subsequent 07/25/2011  . ANXIETY, SITUATIONAL 09/25/2009  . ARTHRITIS 09/25/2009  . Vitamin D deficiency 09/27/2008  . Hyperlipidemia 09/17/2008  . Elevated blood pressure reading without diagnosis of hypertension 09/17/2008  . COLONIC POLYPS, HX OF 09/17/2008  . PERSONAL HISTORY DISEASES SKIN&SUBCUT TISSUE 09/17/2008    Atheena Spano W. 03/06/2020, 12:13 PM  Frazier Butt., PT  [facility] [address] [place], [place], [address] Phone: [phone]   Fax:  [phone]  Name: Pamela Alvarez MRN: [id number] Date of Birth: 1940/07/19

## 2020-03-06 NOTE — Patient Instructions (Signed)
   Be specific with how [name] can help with the device - She needs to know to be the lead on the device and cue her to use it  [name] can use photo albums and open ended and choice questions to practice conversation  Therapy Activities on Touch Talk - have Azaylia say the words/answers aloud even if the activity doesn't call for it  Modify the therapy activities by reading aloud, verbalizing her choices and having her say words aloud  A stand for her touch talk may be helpful   Just play around with the Therapy Exercises and see what she likes

## 2020-03-06 NOTE — Therapy (Signed)
[facility] [address] [place], [place], [address] Phone: [phone]   Fax:  [phone]  Speech Language Pathology Treatment  Patient Details  Name: Pamela Alvarez MRN: [id number] Date of Birth: 04-02-1940 Referring Provider (SLP): Dohmeier, Asencion Partridge, MD   Encounter Date: 03/06/2020   End of Session - 03/06/20 1115    Visit Number 12    Number of Visits 17    Date for SLP Re-Evaluation 03/13/20    SLP Start Time 1017    SLP Stop Time  1100    SLP Time Calculation (min) 43 min    Activity Tolerance Patient tolerated treatment well           Past Medical History:  Diagnosis Date  . Cyst 07-2008   Cyst on back x2 that were drained  . H/O echocardiogram 02-2000  . Hx of colonic polyps   . Hyperlipidemia   . Hypertension   . Varicose veins of both lower extremities     Past Surgical History:  Procedure Laterality Date  . POLYPECTOMY     Colon  . TONSILLECTOMY    . TUBAL LIGATION      There were no vitals filed for this visit.   Subjective Assessment - 03/06/20 1022    Subjective "Pamela Alvarez starts today"    Patient is accompained by: Family member   sposue,   Currently in Pain? No/denies                 ADULT SLP TREATMENT - 03/06/20 1024      General Information   Behavior/Cognition Alert;Cooperative;Requires cueing      Treatment Provided   Treatment provided Cognitive-Linquistic      Cognitive-Linquistic Treatment   Treatment focused on Cognition;Aphasia;Patient/family/caregiver education    Skilled Treatment Pt arrives with Touch Talk. They have a new personal care addendant starting to work with Pamela Alvarez. Demonstrated Therapy Activities/Therapy Exercises on Touch Talk and trained pt and spouse in modifications they can make to the activities to assist Pamela Alvarez and her new aide have success with these. Practiced phrase completion, ID item in category, opposites and answering "what" questions.  Pamela Alvarez required usual min A to locate and use the "speak" icon to have device read question or prompt to her and usual min to mod A to select and speak the correct answer with choice of 2. . Trained spouse to cue Pamela Alvarez to repeat the phrase or say the answer aloud, even it the task doesn't require her to speak, to maintain verbaliziation. Also instructed them to educate personal care aide to allow extended time of 20+ seconds for Pamela Alvarez to respond to questions and therapy exercises on Touch Talk before giving additional cues or questions. Spouse reports family has used Touch Talk with Pamela Alvarez when they visited and she was "getting her words out quicker" as she practiced with family.      Assessment / Recommendations / Plan   Plan Continue with current plan of care      Progression Toward Goals   Progression toward goals Progressing toward goals            SLP Education - 03/06/20 1112    Education Details use of Touch Talk for therapy exercises;    Person(s) Educated Patient;Spouse    Methods Explanation;Demonstration;Verbal cues;Handout    Comprehension Verbalized understanding;Returned demonstration;Verbal cues required            SLP Short Term Goals - 03/06/20 1114      SLP SHORT  TERM GOAL #1   Title pt will relay simple biographical information using AAC if necessary with mod cues x 3 visits.    Baseline 12/14/19    Time 1    Period Weeks    Status Partially Met      SLP SHORT TERM GOAL #2   Title pt will respond to simple questions re: preferences, using AAC if necessary, with mod cues x3 visits.    Baseline 12/12/19 12/14/19, 01/09/20    Time 2    Period Weeks    Status Achieved      SLP SHORT TERM GOAL #3   Title Pt will ask an appropriate question in simple communication exchange, using AAC if necessary, in 2 sessions.    Baseline 12/14/19    Time 2    Period Weeks    Status Partially Met            SLP Long Term Goals - 03/06/20 1114      SLP LONG TERM GOAL #1    Title pt/spouse will report improved communication abilities than prior to beginning ST    Time 2    Period Weeks    Status On-going      SLP LONG TERM GOAL #2   Title Pt/spouse will report using AAC/supportive conversation strategies to resolve communication breakdown outside Pamela Alvarez x 3 visits  with mod caregiver assistance    Baseline 12/14/19; 03/06/20    Time 2    Period Weeks    Status On-going      SLP LONG TERM GOAL #3   Title Pt will make simple requests, using AAC if necessary, outside Pamela Alvarez room x 3 sessions with mod caregiver assistance    Baseline 12/14/19; 03/06/20    Time 2   renewed 11/24   Period Weeks    Status On-going   omitted "over three sessions"     SLP LONG TERM GOAL #4   Title Pt will use AAC 3 in a simple social exchange (phone or in person gathering) with mod cues from spouse, x 3 sessions.    Time --   renewed 11/24     SLP LONG TERM GOAL #5   Time --   renewed 11/24           Plan - 03/06/20 1112    Clinical Impression Statement Pamela Alvarez continues to present with aphasia and cognitive deficits secondary to PSP, recently diagnosed. She has received [employer] Touch Talk to aid communication. Ongoing traing of spouse in prgramming and assisting pt in using device, and training Pamela Alvarez on communicating with device. Their goals are for Pamela Alvarez to particiate in conversation with friends and in [place] appointment., as well as use the Therapy Activities on the device with a personal care aide. Continue skilled ST 1-2 more visits for training in carryover of SGD.    Speech Therapy Frequency 2x / week    Duration 8 weeks   17 visits   Treatment/Interventions SLP instruction and feedback;Compensatory strategies;Patient/family education;Multimodal communcation approach;Cueing hierarchy;Language facilitation;Functional tasks;Oral motor exercises;Internal/external aids;Environmental controls;Cognitive reorganization    Potential to Achieve Goals Fair    Potential  Considerations Severity of impairments           Patient will benefit from skilled therapeutic intervention in order to improve the following deficits and impairments:   Aphasia  Verbal apraxia  Cognitive communication deficit    Problem List Patient Active Problem List   Diagnosis Date Noted  . Unable to ambulate 02/12/2020  .  Progressive supranuclear palsy ([place]) 10/12/2019  . Encephalomalacia on imaging study 10/12/2019  . Fluency disorder associated with underlying disease 02/08/2018  . Cognitive and neurobehavioral dysfunction 02/08/2018  . 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 Arthritis of carpometacarpal [facility]) joint of left thumb 01/24/2018  . Ankle fracture, left 09/03/2016  . Hand dysfunction 03/11/2016  . Symptomatic varicose veins, bilateral 03/11/2016  . Numbness of left thumb 03/11/2016  . Elevated BP 07/27/2014  . Bunion of left foot 07/26/2014  . Abnormality of gait 07/26/2014  . Visit for preventive health examination 07/28/2013  . Right anterior knee pain 07/22/2012  . Medicare annual wellness visit, subsequent 07/25/2011  . ANXIETY, SITUATIONAL 09/25/2009  . ARTHRITIS 09/25/2009  . Vitamin D deficiency 09/27/2008  . Hyperlipidemia 09/17/2008  . Elevated blood pressure reading without diagnosis of hypertension 09/17/2008  . COLONIC POLYPS, HX OF 09/17/2008  . PERSONAL HISTORY DISEASES SKIN&SUBCUT TISSUE 09/17/2008    Oluwatamilore Starnes, Annye Rusk MS, CCC-SLP 03/06/2020, 11:17 AM  [facility] [address] [place], [place], [address] Phone: [phone]   Fax:  [phone]   Name: Nary Sneed MRN: [id number] Date of Birth: 1940-05-05

## 2020-03-10 ENCOUNTER — Other Ambulatory Visit: Payer: Self-pay | Admitting: Neurology

## 2020-03-12 ENCOUNTER — Other Ambulatory Visit: Payer: Self-pay

## 2020-03-12 DIAGNOSIS — H2513 Age-related nuclear cataract, bilateral: Secondary | ICD-10-CM | POA: Diagnosis not present

## 2020-03-12 NOTE — Patient Outreach (Signed)
[facility]) Care Management  03/12/2020  Pamela Alvarez 11-26-1940 [id number]   Referral Date:03/06/20 Referral Source: MD referral Referral Reason: Needs resources   Outreach Attempt: no answer. HIPAA compliant voice message left.     Plan: RN CM will attempt again within 4 business days and send letter.    Bary Leriche, RN, MSN [facility] Care Management Care Management Coordinator Direct Line [phone] Toll Free: [phone]  Fax: [phone]

## 2020-03-13 ENCOUNTER — Other Ambulatory Visit: Payer: Self-pay

## 2020-03-13 ENCOUNTER — Ambulatory Visit: Payer: Medicare PPO

## 2020-03-13 DIAGNOSIS — R41841 Cognitive communication deficit: Secondary | ICD-10-CM | POA: Insufficient documentation

## 2020-03-13 DIAGNOSIS — R4701 Aphasia: Secondary | ICD-10-CM | POA: Insufficient documentation

## 2020-03-13 DIAGNOSIS — R482 Apraxia: Secondary | ICD-10-CM | POA: Insufficient documentation

## 2020-03-13 DIAGNOSIS — R2681 Unsteadiness on feet: Secondary | ICD-10-CM | POA: Insufficient documentation

## 2020-03-13 DIAGNOSIS — R2689 Other abnormalities of gait and mobility: Secondary | ICD-10-CM | POA: Insufficient documentation

## 2020-03-13 NOTE — Therapy (Signed)
[facility] [address] [place], [place], [address] Phone: [phone]   Fax:  [phone]  Patient Details  Name: Pamela Alvarez MRN: [id number] Date of Birth: [birth year] Referring Provider:  [name], MD  Encounter Date: 03/13/2020  ST - arrive/cancel  [name] was originally scheduled on SLP-Lovvorn and SLP- Shakeya Kerkman apologized and told [name] that SLPs-Tynisha Ogan and Lovvorn had to exchange patients "at the very last minute".  As this SLP walked back to ST room with [name] and [name], [place] indicated he did not want [name] to see SLP-Jefferey Lippmann today, on [name]'s last day of therapy. When this SLP offered to see if pt could be rescheduled with SLP- Lovvorn, he declined and chose to leave instead.   [facility] ,MS, CCC-SLP  03/13/2020, 10:45 AM  [facility] [address] [place], [place], [address] Phone: [phone]   Fax:  [phone]

## 2020-03-14 ENCOUNTER — Encounter: Payer: Self-pay | Admitting: Physical Therapy

## 2020-03-14 ENCOUNTER — Other Ambulatory Visit: Payer: Self-pay

## 2020-03-14 ENCOUNTER — Ambulatory Visit: Payer: Medicare PPO | Attending: Neurology | Admitting: Physical Therapy

## 2020-03-14 DIAGNOSIS — R4701 Aphasia: Secondary | ICD-10-CM | POA: Diagnosis not present

## 2020-03-14 DIAGNOSIS — R2689 Other abnormalities of gait and mobility: Secondary | ICD-10-CM | POA: Diagnosis not present

## 2020-03-14 DIAGNOSIS — R2681 Unsteadiness on feet: Secondary | ICD-10-CM | POA: Diagnosis not present

## 2020-03-14 DIAGNOSIS — R482 Apraxia: Secondary | ICD-10-CM | POA: Diagnosis not present

## 2020-03-14 DIAGNOSIS — R41841 Cognitive communication deficit: Secondary | ICD-10-CM | POA: Diagnosis not present

## 2020-03-14 NOTE — Therapy (Signed)
[facility] [address] [place], [place], [address] Phone: [phone]   Fax:  [phone]  Physical Therapy Treatment  Patient Details  Name: Pamela Alvarez MRN: [id number] Date of Birth: [birth year] Referring Provider (PT): Dohmeier, Asencion Partridge   Encounter Date: 03/14/2020   PT End of Session - 03/14/20 2125    Visit Number 22    Number of Visits 25   per recert 94/76/5465   Date for PT Re-Evaluation 03/54/65   90 day recert, to capture w/c training that may be needed upon receipt of new power wheelchair   Authorization Type [employer]-submitted auth at completion of eval; new auth request for 10 additional visits requested after visit 12/21/2019; request for additional visits for w/c assessment/training, submitted after 03/06/2020 visit    Authorization Time Period 10/21-12/30/21 for 10 visits    Authorization - Visit Number 10    Authorization - Number of Visits 10    PT Start Time 6812    PT Stop Time 1305    PT Time Calculation (min) 30 min    Activity Tolerance Patient tolerated treatment well    Behavior During Therapy Flat affect;WFL for tasks assessed/performed   decreased safety awareness          Past Medical History:  Diagnosis Date  . Cyst 07-2008   Cyst on back x2 that were drained  . H/O echocardiogram 02-2000  . Hx of colonic polyps   . Hyperlipidemia   . Hypertension   . Varicose veins of both lower extremities     Past Surgical History:  Procedure Laterality Date  . POLYPECTOMY     Colon  . TONSILLECTOMY    . TUBAL LIGATION      There were no vitals filed for this visit.   Subjective Assessment - 03/14/20 2123    Subjective No falls since last visit.  Husband and pt really would like to finish PT today.    Patient is accompained by: Family member   Husband-provides history   Patient Stated Goals To try to maintain as much as possible, try to prevent regression as much as possible.     Currently in Pain? No/denies                     Self Care: Wheelchair vendor, Deberah Pelton, with [employer] present to discuss options for power vs. Manual mobility with pt and husband.  PT presented info based on pt's increased fall risk per TUG and gait velocity scores as well as progressive dx, hx of lower extremity swelling, and LLE DVT.  Pt's husband discussed their main purpose in looking into power (caregiver assisted drive) mobility was for outdoor purposes.  W/c vendor makes recommendations that power mobility is not indicated for patient, given that pt would not be a safe or independent driver of power mobility at any time.  He makes recommendations on manual tilt-in-space options with elevating legrests and stroller handle for ease of caregiver pushing.  Pt and husband not interested in those options.  W/c vendor does make recommendation on Jazzy power outdoor mobility chair, with larger wheels, that would be an out-of-pocket expense, but would be a viable option for pt at current time, for outdoor outings that husband is describing.    At this time, pt/husband decide not to pursue further power or manual options that PT and vendor need to assist with.  Husband may look into out of pocket power Jazzy option.  PT Education - 03/14/20 2123    Education Details options for w/c mobility (either manual tilt in space or Jazzy power chair for outdoor use(out of pocket expense))-info provided for further research by w/c vendor    Person(s) Educated Patient;Spouse    Methods Explanation   computer links to follow up with possible outdoor power chair option out of pocket   Comprehension Verbalized understanding            PT Short Term Goals - 02/14/20 1201      PT SHORT TERM GOAL #1   Title Pt will perform at least 4 minutes of continuous gait activity with rollator, min assist, for improved endurance for gait in the home.  TARGET 02/16/2020     Baseline able to ambulate 150' in 4 minutes 50 seconds with rollator and min assist on 02/14/20    Time 4    Period Weeks    Status Achieved      PT SHORT TERM GOAL #2   Title Pt will perform 10 reps of sit<>stand with minimal assistance for improved transfer efficiency and functional strength.    Baseline 7 reps with min-mod assist and posterior lean on 02/14/20    Time 4    Period Weeks    Status Not Met      PT SHORT TERM GOAL #3   Title Pt will improve TUG score to less than or equal to 60 seconds for decreased fall risk.    Baseline 02/14/20 95 seconds with HHA of PTA with pt's hands on PTA arms and PTA walking backwards    Time 4    Period Weeks    Status Not Met             PT Long Term Goals - 03/06/20 1214      PT LONG TERM GOAL #1   Title Pt will participate in power w/c assessment with attendant controls, for improved safety within home environment.  TARGET 05/03/2020 (date extended due to awaiting power w/c arrival)    Time 4    Period Weeks    Status New      PT LONG TERM GOAL #2   Title Husband will demo understanding of attendant controls for power wheelchair, including for mobility and transfers.    Time 4    Period Weeks    Status New                 Plan - 03/14/20 2126    Clinical Impression Statement Pt, husband, and w/c vendor present today for possible power w/c assessment.  Power w/c with attendant controls not an option, as pt would be unable to perform any of the driving tasks.  Vendor provides option for manual tilt-in-space w/c, which patient and husband are not interested in at this time, as they want something for outdoor and community use.  They are aware w/c for this use would be an out-of pocket expense; vendor provides info on [employer] power outdoor use chair, but ultimately pt/husband do not want to pursue power mobility/manual mobility through insurance at this time.  They would like for today to be last visit, as they feel they have care  set up in the home for carryover of therapy recommendaitons at this time.    Personal Factors and Comorbidities Comorbidity 3+;Other    Comorbidities See problem list; recent diagnosis of PSP    Examination-Activity Limitations Locomotion Level;Transfers;Dressing;Stairs;Stand;Toileting    Examination-Participation Restrictions Community Activity;Shop    Stability/Clinical Decision  Making Evolving/Moderate complexity    Rehab Potential Good    PT Frequency 1x / week    PT Duration 4 weeks   per recert 53/20/2334; may be delayed due to awaiting w/c recommendations and receipt of chair   PT Treatment/Interventions ADLs/Self Care Home Management;Gait training;Functional mobility training;Therapeutic activities;Therapeutic exercise;Balance training;Neuromuscular re-education;Patient/family education;Wheelchair mobility training    PT Next Visit Plan Plan for d/c.  PT to keep pt's chart open for several weeks prior to formal d/c, in case any additional assistance is needed for w/c assessment or documentation    Consulted and Agree with Plan of Care Patient;Family member/caregiver    Family Member Consulted Husband           Patient will benefit from skilled therapeutic intervention in order to improve the following deficits and impairments:  Abnormal gait,Difficulty walking,Decreased safety awareness,Decreased endurance,Decreased activity tolerance,Decreased balance,Decreased mobility,Decreased strength,Postural dysfunction  Visit Diagnosis: Unsteadiness on feet  Other abnormalities of gait and mobility     Problem List Patient Active Problem List   Diagnosis Date Noted  . Unable to ambulate 02/12/2020  . Progressive supranuclear palsy ([place]) 10/12/2019  . Encephalomalacia on imaging study 10/12/2019  . Fluency disorder associated with underlying disease 02/08/2018  . Cognitive and neurobehavioral dysfunction 02/08/2018  . 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 Arthritis of  carpometacarpal [facility]) joint of left thumb 01/24/2018  . Ankle fracture, left 09/03/2016  . Hand dysfunction 03/11/2016  . Symptomatic varicose veins, bilateral 03/11/2016  . Numbness of left thumb 03/11/2016  . Elevated BP 07/27/2014  . Bunion of left foot 07/26/2014  . Abnormality of gait 07/26/2014  . Visit for preventive health examination 07/28/2013  . Right anterior knee pain 07/22/2012  . Medicare annual wellness visit, subsequent 07/25/2011  . ANXIETY, SITUATIONAL 09/25/2009  . ARTHRITIS 09/25/2009  . Vitamin D deficiency 09/27/2008  . Hyperlipidemia 09/17/2008  . Elevated blood pressure reading without diagnosis of hypertension 09/17/2008  . COLONIC POLYPS, HX OF 09/17/2008  . PERSONAL HISTORY DISEASES SKIN&SUBCUT TISSUE 09/17/2008    Sadey Yandell W. 03/14/2020, 9:30 PM  Frazier Butt., PT   [facility] [address] [place], [place], [address] Phone: [phone]   Fax:  [phone]  Name: Pamela Alvarez MRN: [id number] Date of Birth: 1940/03/24

## 2020-03-14 NOTE — Patient Outreach (Signed)
[facility]) Care Management  03/14/2020  Shanai Lartigue [birth year] [id number]   Referral Date:03/06/20 Referral Source: MD referral Referral Reason: Needs resources   Outreach Attempt: spoke with spouse Leonette Most.  Discussed reason for referral.  He states that he has been reaching out to agencies such as [facility] and [facility] for help with patient and [facility] has been able to provide staffing needs for patient as she is 24/7 care and he is managing.  He states he really does not need to continue to follow through with referral at this time.   Discussed [facility] services and support.  He has declined but is willing to receive letter and resource listing for further reference.    Plan: RN CM will send letter and care resource listing.   RN CM will close case.    Bary Leriche, RN, MSN [facility] Care Management Care Management Coordinator Direct Line [phone] Cell [phone] Toll Free: [phone]  Fax: [phone]

## 2020-03-15 ENCOUNTER — Ambulatory Visit: Payer: Self-pay

## 2020-04-02 ENCOUNTER — Encounter: Payer: Self-pay | Admitting: Internal Medicine

## 2020-04-03 NOTE — Telephone Encounter (Signed)
Dr Charlsie Merles at [facility] but have had good results with the   entire practice

## 2020-04-04 ENCOUNTER — Other Ambulatory Visit: Payer: Self-pay | Admitting: Internal Medicine

## 2020-04-07 NOTE — Progress Notes (Signed)
Chief Complaint  Patient presents with   frequent urination    Going on x5-6 weeks, no blood    HPI: Pamela Alvarez 80 y.o. come in for frequent urination over the last 5 weeks here with spouse.  He is caretaker. She is continent but goes to the bathroom fairly frequently nocturnally every few hours at times although the last 2 days has been slightly better. No complaint of fever chills hematuria obvious dysuria. She has some baseline constipation and goes about every 3 days needs to take prune juice to keep going. No change in appetite new medicines except for Eliquis  Uncertain if she has abdominal pain but may be discomfort. ROS: See pertinent positives and negatives per HPI.  Past Medical History:  Diagnosis Date   Cyst 07-2008   Cyst on back x2 that were drained   H/O echocardiogram 02-2000   Hx of colonic polyps    Hyperlipidemia    Hypertension    Varicose veins of both lower extremities     Family History  Problem Relation Age of Onset   Hypertension Mother    Parkinsonism Mother    Angina Father    Heart disease Father    Colon cancer Father    Lung cancer Father    Skin cancer Father    Cancer Father        Heart   Breast cancer Sister        over 75   Diabetes Sister    Heart disease Brother 31       triple bypass surgery   Macular degeneration Sister    Diabetes Sister        prediabetic   Hearing loss Sister        hearing problems   Vasculitis Sister     Social History   Socioeconomic History   Marital status: Married    Spouse name: [name]   Number of children: 2   Years of education: Not on file   Highest education level: Doctorate  Occupational History   Occupation: retired    Comment: Social research officer, government [name]  Tobacco Use   Smoking status: Never Smoker   Smokeless tobacco: Never Used  Substance and Sexual Activity   Alcohol use: Yes    Alcohol/week: 7.0 standard drinks    Types: 7 Glasses of wine per  week    Comment: one a day   Drug use: Not Currently   Sexual activity: Not on file  Other Topics Concern   Not on file  Social History Narrative   Regular exercise-yes   Moved back to [place] from [place]   Hh of 2 cat   G3 P2    Exercises walking regu;arly    Retired  Social research officer, government   Drinks one cup of coffee  A day. In addition to walking QD she and her husband take a pilates class on Thursdays. She lives with her husband in a 2 story house, though the master bedroom in on the first level.       Social Determinants of Health   Financial Resource Strain: Low Risk    Difficulty of Paying Living Expenses: Not hard at all  Food Insecurity: No Food Insecurity   Worried About Programme researcher, broadcasting/film/video in the Last Year: Never true   Ran Out of Food in the Last Year: Never true  Transportation Needs: No Transportation Needs   Lack of Transportation (Medical): No   Lack of Transportation (Non-Medical): No  Physical Activity:  Insufficiently Active   Days of Exercise per Week: 3 days   Minutes of Exercise per Session: 40 min  Stress: No Stress Concern Present   Feeling of Stress : Not at all  Social Connections: Moderately Isolated   Frequency of Communication with Friends and Family: Never   Frequency of Social Gatherings with Friends and Family: More than three times a week   Attends Religious Services: Never   Database administrator or Organizations: No   Attends Banker Meetings: Never   Marital Status: Married    Outpatient Medications Prior to Visit  Medication Sig Dispense Refill   apixaban (ELIQUIS) 5 MG TABS tablet Take 1 tablet (5 mg total) by mouth 2 (two) times daily. For DVT 60 tablet 3   atorvastatin (LIPITOR) 10 MG tablet TAKE 1 TABLET BY MOUTH EVERY DAY 90 tablet 2   Cholecalciferol (VITAMIN D3 PO) Take 1 tablet by mouth daily.     losartan (COZAAR) 25 MG tablet TAKE 1 TABLET BY MOUTH EVERY DAY 90 tablet 1   vitamin B-12  (CYANOCOBALAMIN) 1000 MCG tablet Take 1,000 mcg by mouth 3 (three) times a week.     No facility-administered medications prior to visit.     EXAM:  BP 140/80 (BP Location: Right Arm, Patient Position: Sitting, Cuff Size: Normal)    Pulse 86    Temp 98.3 F (36.8 C) (Oral)    Wt 150 lb 9.6 oz (68.3 kg)    SpO2 96%    BMI 27.55 kg/m   Body mass index is 27.55 kg/m.  GENERAL: vitals reviewed and listed above, alert, will to answer questions few words at a time, appears well hydrated and in no acute distress in wheel chair  HEENT: atraumatic, conjunctiva  clear, no obvious abnormalities on inspection of external nose and ears OP masked NECK: no obvious masses on inspection palpation  CV: HRRR, no clubbing cyanosis nl cap refill  Abdomen nontender no obvious masses sitting in wheelchair. MS: moves all extremities left lower extremity larger than right but no acute findings. Skin no acute findings bleeding normal turgor. Lab Results  Component Value Date   WBC 7.6 12/22/2019   HGB 14.3 12/22/2019   HCT 42.8 12/22/2019   PLT 300 12/22/2019   GLUCOSE 106 (H) 12/22/2019   CHOL 202 (H) 12/22/2019   TRIG 110 12/22/2019   HDL 69 12/22/2019   LDLDIRECT 191.8 07/22/2012   LDLCALC 111 (H) 12/22/2019   ALT 9 12/22/2019   AST 18 12/22/2019   NA 140 12/22/2019   K 4.3 12/22/2019   CL 105 12/22/2019   CREATININE 1.13 (H) 12/22/2019   BUN 20 12/22/2019   CO2 25 12/22/2019   TSH 2.10 12/22/2019   HGBA1C 5.7 12/23/2018   BP Readings from Last 3 Encounters:  04/08/20 140/80  02/12/20 116/71  01/17/20 132/84   Record revew ASSESSMENT AND PLAN:  Discussed the following assessment and plan:  Urinary frequency - Plan: Culture, Urine, POCT Urinalysis Dipstick (Automated)  Progressive supranuclear palsy (HCC)  Medication management Uncertain cause check urine today for infection.  Unable to get specimen this afternoon will send home with apparatus to bring in fresh specimen for UA and  culture.  Possible motility changes with underlying condition and possible mild constipation.  Try using the prune juice every day and monitor. Consider a voiding diary for the short-term. review counsel  And planning  Orders  -Patient advised to return or notify health care team  if  new  concerns arise.  Patient Instructions   Get [place] a fresh specimen is clean-catch as possible and bring in within an hour to 90 minutes so we can check for infection. In the meantime work on more regular bowel habits which may help urinary function. May be take prune juice every day if acceptable. Try doing a short-term voiding diary which is monitoring how many times going to the bathroom urination and bowel habit Can do amount of urine voided +1 to +4 estimated.  If possible.  Sometimes certain types of foods can give bladder irritability such as caffeine, carbonation's high spicy foods.  And alcohol.   Urinary Frequency, Adult Urinary frequency means urinating more often than usual. You may urinate every 1-2 hours even though you drink a normal amount of fluid and do not have a bladder infection or condition. Although you urinate more often than normal, the total amount of urine produced in a day is normal. With urinary frequency, you may have an urgent need to urinate often. The stress and anxiety of needing to find a bathroom quickly can make this urge worse. This condition may go away on its own or you may need treatment at home. Home treatment may include bladder training, exercises, taking medicines, or making changes to your diet. Follow these instructions at home: Bladder health  Keep a bladder diary if told by your health care provider. Keep track of: ? What you eat and drink. ? How often you urinate. ? How much you urinate.  Follow a bladder training program if told by your health care provider. This may include: ? Learning to delay going to the bathroom. ? Double urinating (voiding). This  helps if you are not completely emptying your bladder. ? Scheduled voiding.  Do Kegel exercises as told by your health care provider. Kegel exercises strengthen the muscles that help control urination, which may help the condition.   Eating and drinking  If told by your health care provider, make diet changes, such as: ? Avoiding caffeine. ? Drinking fewer fluids, especially alcohol. ? Not drinking in the evening. ? Avoiding foods or drinks that may irritate the bladder. These include coffee, tea, soda, artificial sweeteners, citrus, tomato-based foods, and chocolate. ? Eating foods that help prevent or ease constipation. Constipation can make this condition worse. Your health care provider may recommend that you:  Drink enough fluid to keep your urine pale yellow.  Take over-the-counter or prescription medicines.  Eat foods that are high in fiber, such as beans, whole grains, and fresh fruits and vegetables.  Limit foods that are high in fat and processed sugars, such as fried or sweet foods. General instructions  Take over-the-counter and prescription medicines only as told by your health care provider.  Keep all follow-up visits as told by your health care provider. This is important. Contact a health care provider if:  You start urinating more often.  You feel pain or irritation when you urinate.  You notice blood in your urine.  Your urine looks cloudy.  You develop a fever.  You begin vomiting. Get help right away if:  You are unable to urinate. Summary  Urinary frequency means urinating more often than usual. With urinary frequency, you may urinate every 1-2 hours even though you drink a normal amount of fluid and do not have a bladder infection or other bladder condition.  Your health care provider may recommend that you keep a bladder diary, follow a bladder training program, or make dietary changes.  If told by your health care provider, do Kegel exercises to  strengthen the muscles that help control urination.  Take over-the-counter and prescription medicines only as told by your health care provider.  Contact a health care provider if your symptoms do not improve or get worse. This information is not intended to replace advice given to you by your health care provider. Make sure you discuss any questions you have with your health care provider. Document Revised: 09/02/2017 Document Reviewed: 09/02/2017 Elsevier Patient Education  2021 [employer].     [place]. Genavive Kubicki M.D.

## 2020-04-08 ENCOUNTER — Other Ambulatory Visit: Payer: Self-pay

## 2020-04-08 ENCOUNTER — Encounter: Payer: Self-pay | Admitting: Internal Medicine

## 2020-04-08 ENCOUNTER — Ambulatory Visit: Payer: Medicare PPO | Admitting: Internal Medicine

## 2020-04-08 VITALS — BP 140/80 | HR 86 | Temp 98.3°F | Wt 150.6 lb

## 2020-04-08 DIAGNOSIS — G231 Progressive supranuclear ophthalmoplegia [Steele-Richardson-Olszewski]: Secondary | ICD-10-CM

## 2020-04-08 DIAGNOSIS — Z79899 Other long term (current) drug therapy: Secondary | ICD-10-CM

## 2020-04-08 DIAGNOSIS — R350 Frequency of micturition: Secondary | ICD-10-CM

## 2020-04-08 NOTE — Patient Instructions (Addendum)
Get [place] a fresh specimen is clean-catch as possible and bring in within an hour to 90 minutes so we can check for infection. In the meantime work on more regular bowel habits which may help urinary function. May be take prune juice every day if acceptable. Try doing a short-term voiding diary which is monitoring how many times going to the bathroom urination and bowel habit Can do amount of urine voided +1 to +4 estimated.  If possible.  Sometimes certain types of foods can give bladder irritability such as caffeine, carbonation's high spicy foods.  And alcohol.   Urinary Frequency, Adult Urinary frequency means urinating more often than usual. You may urinate every 1-2 hours even though you drink a normal amount of fluid and do not have a bladder infection or condition. Although you urinate more often than normal, the total amount of urine produced in a day is normal. With urinary frequency, you may have an urgent need to urinate often. The stress and anxiety of needing to find a bathroom quickly can make this urge worse. This condition may go away on its own or you may need treatment at home. Home treatment may include bladder training, exercises, taking medicines, or making changes to your diet. Follow these instructions at home: Bladder health  Keep a bladder diary if told by your health care provider. Keep track of: ? What you eat and drink. ? How often you urinate. ? How much you urinate.  Follow a bladder training program if told by your health care provider. This may include: ? Learning to delay going to the bathroom. ? Double urinating (voiding). This helps if you are not completely emptying your bladder. ? Scheduled voiding.  Do Kegel exercises as told by your health care provider. Kegel exercises strengthen the muscles that help control urination, which may help the condition.   Eating and drinking  If told by your health care provider, make diet changes, such as: ? Avoiding  caffeine. ? Drinking fewer fluids, especially alcohol. ? Not drinking in the evening. ? Avoiding foods or drinks that may irritate the bladder. These include coffee, tea, soda, artificial sweeteners, citrus, tomato-based foods, and chocolate. ? Eating foods that help prevent or ease constipation. Constipation can make this condition worse. Your health care provider may recommend that you:  Drink enough fluid to keep your urine pale yellow.  Take over-the-counter or prescription medicines.  Eat foods that are high in fiber, such as beans, whole grains, and fresh fruits and vegetables.  Limit foods that are high in fat and processed sugars, such as fried or sweet foods. General instructions  Take over-the-counter and prescription medicines only as told by your health care provider.  Keep all follow-up visits as told by your health care provider. This is important. Contact a health care provider if:  You start urinating more often.  You feel pain or irritation when you urinate.  You notice blood in your urine.  Your urine looks cloudy.  You develop a fever.  You begin vomiting. Get help right away if:  You are unable to urinate. Summary  Urinary frequency means urinating more often than usual. With urinary frequency, you may urinate every 1-2 hours even though you drink a normal amount of fluid and do not have a bladder infection or other bladder condition.  Your health care provider may recommend that you keep a bladder diary, follow a bladder training program, or make dietary changes.  If told by your health care provider,  do Kegel exercises to strengthen the muscles that help control urination.  Take over-the-counter and prescription medicines only as told by your health care provider.  Contact a health care provider if your symptoms do not improve or get worse. This information is not intended to replace advice given to you by your health care provider. Make sure you  discuss any questions you have with your health care provider. Document Revised: 09/02/2017 Document Reviewed: 09/02/2017 Elsevier Patient Education  2021 [employer].

## 2020-04-09 ENCOUNTER — Other Ambulatory Visit: Payer: Medicare PPO

## 2020-04-09 DIAGNOSIS — R350 Frequency of micturition: Secondary | ICD-10-CM

## 2020-04-09 LAB — POC URINALSYSI DIPSTICK (AUTOMATED)
Bilirubin, UA: 1
Blood, UA: 10
Glucose, UA: NEGATIVE
Ketones, UA: NEGATIVE
Nitrite, UA: NEGATIVE
Protein, UA: NEGATIVE
Spec Grav, UA: >=1.03 — AB (ref 1.010–1.025)
Urobilinogen, UA: 0.2 E.U./dL
pH, UA: 6 (ref 5.0–8.0)

## 2020-04-09 NOTE — Addendum Note (Signed)
Addended by: Nolon Lennert on: 04/09/2020 05:12 PM   Modules accepted: Orders

## 2020-04-09 NOTE — Addendum Note (Signed)
Addended by: Raj Janus T on: 04/09/2020 04:46 PM   Modules accepted: Orders

## 2020-04-10 LAB — URINE CULTURE
MICRO NUMBER:: 11481502
SPECIMEN QUALITY:: ADEQUATE

## 2020-04-11 NOTE — Progress Notes (Signed)
See note for urine culture not consistnet with UTI.     the UA is abnormal but may be from external factors. We should repeat the UA when hydrated at f/u and if new symptoms arise. With midstream clean catch . As possible

## 2020-04-11 NOTE — Progress Notes (Signed)
So the urinalysis has a small amount of inflammation cells but that could be external. Urine culture shows no predominant germ so no documented UTI. If beginning to complain of pain then would redo culture  otherwise do the voiding diary ,work on the constipation and if persistent we may consider seeing urologist for urinary dysfunction whether there are medicines that would be helpful s unclear at this time. We can follow-up at her next appointment with the voiding diary earlier if things are getting worse.

## 2020-05-10 ENCOUNTER — Ambulatory Visit: Payer: Medicare PPO | Admitting: Internal Medicine

## 2020-05-13 NOTE — Progress Notes (Signed)
Chief Complaint  Patient presents with  . Follow-up    HPI: Pamela Alvarez 80 y.o. come in for Chronic disease management  Fu  On 5-6  montrhs of anticoagulant after swelling and DVT disc diagnosed October 2021.  Currently husband measures her legs every day no real change do not seem to change in the morning or the evening and no complaints.  No unusual bleeding on the Eliquis. To discuss continuation of medication.  In regard to her urinary symptoms they seem to have improved and only has nocturia x1 since they are paying attention to constipation and she is taking prune juice every day. Denies any dysuria serious pain that is new fever.  Urinalysis that was difficult to obtain a specimen in the past showed concentrated urine 4+ leuks but culture showed multiple species probable external contaminants.  No falling no bleeding arranging speech therapy and some physical therapy  privately ordered at home. ROS: See pertinent positives and negatives per HPI.  Past Medical History:  Diagnosis Date  . Cyst 07-2008   Cyst on back x2 that were drained  . H/O echocardiogram 02-2000  . Hx of colonic polyps   . Hyperlipidemia   . Hypertension   . Varicose veins of both lower extremities     Family History  Problem Relation Age of Onset  . Hypertension Mother   . Parkinsonism Mother   . Angina Father   . Heart disease Father   . Colon cancer Father   . Lung cancer Father   . Skin cancer Father   . Cancer Father        Heart  . Breast cancer Sister        over 12  . Diabetes Sister   . Heart disease Brother 82       triple bypass surgery  . Macular degeneration Sister   . Diabetes Sister        prediabetic  . Hearing loss Sister        hearing problems  . Vasculitis Sister     Social History   Socioeconomic History  . Marital status: Married    Spouse name: Pamela Alvarez  . Number of children: 2  . Years of education: Not on file  . Highest education level: Doctorate   Occupational History  . Occupation: retired    Comment: Social research officer, government [name]  Tobacco Use  . Smoking status: Never Smoker  . Smokeless tobacco: Never Used  Substance and Sexual Activity  . Alcohol use: Yes    Alcohol/week: 7.0 standard drinks    Types: 7 Glasses of wine per week    Comment: one a day  . Drug use: Not Currently  . Sexual activity: Not on file  Other Topics Concern  . Not on file  Social History Narrative   Regular exercise-yes   Moved back to [place] from [place] of 2 cat   G3 P2    Exercises walking regu;arly    Retired  Social research officer, government   Drinks one cup of coffee  A day. In addition to walking QD she and her husband take a pilates class on Thursdays. She lives with her husband in a 2 story house, though the master bedroom in on the first level.       Social Determinants of Health   Financial Resource Strain: Low Risk   . Difficulty of Paying Living Expenses: Not hard at all  Food Insecurity: No Food Insecurity  . Worried About Running  Out of Food in the Last Year: Never true  . Ran Out of Food in the Last Year: Never true  Transportation Needs: No Transportation Needs  . Lack of Transportation (Medical): No  . Lack of Transportation (Non-Medical): No  Physical Activity: Insufficiently Active  . Days of Exercise per Week: 3 days  . Minutes of Exercise per Session: 40 min  Stress: No Stress Concern Present  . Feeling of Stress : Not at all  Social Connections: Moderately Isolated  . Frequency of Communication with Friends and Family: Never  . Frequency of Social Gatherings with Friends and Family: More than three times a week  . Attends Religious Services: Never  . Active Member of Clubs or Organizations: No  . Attends Banker Meetings: Never  . Marital Status: Married    Outpatient Medications Prior to Visit  Medication Sig Dispense Refill  . apixaban (ELIQUIS) 5 MG TABS tablet Take 1 tablet (5 mg total) by mouth 2 (two) times  daily. For DVT 60 tablet 3  . atorvastatin (LIPITOR) 10 MG tablet TAKE 1 TABLET BY MOUTH EVERY DAY 90 tablet 2  . Cholecalciferol (VITAMIN D3 PO) Take 1 tablet by mouth daily.    . Famotidine (PEPCID PO) Take by mouth.    . losartan (COZAAR) 25 MG tablet TAKE 1 TABLET BY MOUTH EVERY DAY 90 tablet 1  . vitamin B-12 (CYANOCOBALAMIN) 1000 MCG tablet Take 1,000 mcg by mouth 3 (three) times a week.     No facility-administered medications prior to visit.     EXAM:  BP 138/88 (BP Location: Left Arm, Patient Position: Sitting, Cuff Size: Normal)   Pulse 64   Temp 98.4 F (36.9 C) (Oral)   Ht 5\' 2"  (1.575 m)   Wt 148 lb (67.1 kg)   SpO2 96%   BMI 27.07 kg/m   Body mass index is 27.07 kg/m.  GENERAL: vitals reviewed and listed above, alert, occasionally vertebral appears well hydrated and in no acute distress respond slowly  In WC  HEENT: atraumatic, conjunctiva  clear, no obvious abnormalities on inspection of external nose and ears OP : Masked NECK: no obvious masses on inspection palpation  LUNGS: No respiratory distress to obvious CV: HRRR, no clubbing cyanosis or  peripheral edema nl cap refill  MS: Examination of both distal legs and feet shows some varicosities left lower extremity slightly larger than right minimal edema pulses intact some thickened toenails no ulcers or lesions. PSYCH: pleasant and cooperative, occasionally verbal few words Lab Results  Component Value Date   WBC 7.6 12/22/2019   HGB 14.3 12/22/2019   HCT 42.8 12/22/2019   PLT 300 12/22/2019   GLUCOSE 106 (H) 12/22/2019   CHOL 202 (H) 12/22/2019   TRIG 110 12/22/2019   HDL 69 12/22/2019   LDLDIRECT 191.8 07/22/2012   LDLCALC 111 (H) 12/22/2019   ALT 9 12/22/2019   AST 18 12/22/2019   NA 140 12/22/2019   K 4.3 12/22/2019   CL 105 12/22/2019   CREATININE 1.13 (H) 12/22/2019   BUN 20 12/22/2019   CO2 25 12/22/2019   TSH 2.10 12/22/2019   HGBA1C 5.7 12/23/2018   BP Readings from Last 3 Encounters:   05/14/20 138/88  04/08/20 140/80  02/12/20 116/71    ASSESSMENT AND PLAN:  Discussed the following assessment and plan:  Chronic anticoagulation  Medication management  Progressive supranuclear palsy (HCC)  Urinary frequency - improved   Personal history of DVT (deep vein thrombosis) - oct 2021  seemingly unprovoked  Record review this was probably an unprovoked DVT although may have been related to legs down on a trip but nothing specific. Risk benefit of maintaining on anticoagulation.  Since she has done well ,no bleeding falling etc. we will continue at this time indefinitely and reassess in 6 months.    Since she is doing well and no dysuria pain and put from patient will not repeat her urinalysis at this point.  However if concerning symptoms we can recheck attempt to get a good clean-catch urine hydrated. -Patient advised to return or notify health care team  if  new concerns arise Patient Instructions  Ok to remain on anticoagulation since no bleeding and doing ok for now.  At this time continue bowel hygiene.   Plan  cpx in October.   Or as needed   Neta Mends. Reneta Niehaus M.D.

## 2020-05-14 ENCOUNTER — Ambulatory Visit: Payer: Medicare PPO | Admitting: Internal Medicine

## 2020-05-14 ENCOUNTER — Other Ambulatory Visit: Payer: Self-pay

## 2020-05-14 ENCOUNTER — Encounter: Payer: Self-pay | Admitting: Internal Medicine

## 2020-05-14 VITALS — BP 138/88 | HR 64 | Temp 98.4°F | Ht 62.0 in | Wt 148.0 lb

## 2020-05-14 DIAGNOSIS — G231 Progressive supranuclear ophthalmoplegia [Steele-Richardson-Olszewski]: Secondary | ICD-10-CM | POA: Diagnosis not present

## 2020-05-14 DIAGNOSIS — Z79899 Other long term (current) drug therapy: Secondary | ICD-10-CM

## 2020-05-14 DIAGNOSIS — R350 Frequency of micturition: Secondary | ICD-10-CM | POA: Diagnosis not present

## 2020-05-14 DIAGNOSIS — Z86718 Personal history of other venous thrombosis and embolism: Secondary | ICD-10-CM

## 2020-05-14 DIAGNOSIS — Z7901 Long term (current) use of anticoagulants: Secondary | ICD-10-CM | POA: Diagnosis not present

## 2020-05-14 NOTE — Patient Instructions (Signed)
Ok to remain on anticoagulation since no bleeding and doing ok for now.  At this time continue bowel hygiene.   Plan  cpx in October.   Or as needed

## 2020-06-28 ENCOUNTER — Other Ambulatory Visit: Payer: Self-pay | Admitting: Internal Medicine

## 2020-06-28 NOTE — Telephone Encounter (Signed)
Last office visit- 05/14/20 Last refill-01/17/20-60 tabs 3 refills  Can this patient receive a refill?

## 2020-07-15 ENCOUNTER — Ambulatory Visit: Payer: Medicare PPO | Admitting: Internal Medicine

## 2020-08-12 ENCOUNTER — Ambulatory Visit: Payer: Medicare PPO | Admitting: Adult Health

## 2020-08-12 ENCOUNTER — Encounter: Payer: Self-pay | Admitting: Adult Health

## 2020-08-12 VITALS — BP 155/74 | HR 82

## 2020-08-12 DIAGNOSIS — G231 Progressive supranuclear ophthalmoplegia [Steele-Richardson-Olszewski]: Secondary | ICD-10-CM | POA: Diagnosis not present

## 2020-08-12 NOTE — Patient Instructions (Signed)
Your Plan:  Continue to monitor symptoms If your symptoms worsen or you develop new symptoms please let us know.   Thank you for coming to see us at [facility]. I hope we have been able to provide you high quality care today.  You may receive a patient satisfaction survey over the next few weeks. We would appreciate your feedback and comments so that we may continue to improve ourselves and the health of our patients.   

## 2020-08-12 NOTE — Progress Notes (Signed)
PATIENT: Pamela Alvarez DOB: 16-Apr-1940  REASON FOR VISIT: follow up HISTORY FROM: patient PRIMARY NEUROLOGIST:   HISTORY OF PRESENT ILLNESS: Today 08/12/20:  Pamela Alvarez is an 80 year old female with a history of supranuclear palsy.  She returns today for follow-up.  She was on Sinemet but the family felt that it offered her no benefit.  This was discontinued at the last visit.  Overall they feel that she has progressed since her last visit.  She requires assistance with all ADLs.  She is able to eat finger foods but has difficulty using a spoon or fork.  Family denies any changes in her mood or behavior.  Tends to have a tremor in the left hand.  Uses a wheelchair primarily.  Her husband is her primary caregiver but he does have an aide that comes in 3-4 times a week to help.  She returns today for an evaluation.  HISTORY Rv on 02-12-2020: The patient has progressed, is left with very limited mobility, right hand is still " of use", and left is not. There is a discussion about an electric wheelchair, if that is self directed how will she use it, direct it? There is little sense in repeating MMSE. Sinemett has made no difference in Mr. and Mrs Slaymaker's opinion.  They are content, she is much less frustrated when using the tablet. Aphasia is a cause of great difficulties in communication. DVT in October , now on Eliquis.    REVIEW OF SYSTEMS: Out of a complete 14 system review of symptoms, the patient complains only of the following symptoms, and all other reviewed systems are negative.  ALLERGIES: Allergies  Allergen Reactions  . Dust Mite Extract Cough  . Sulfonamide Derivatives     HOME MEDICATIONS: Outpatient Medications Prior to Visit  Medication Sig Dispense Refill  . atorvastatin (LIPITOR) 10 MG tablet TAKE 1 TABLET BY MOUTH EVERY DAY 90 tablet 2  . Cholecalciferol (VITAMIN D3 PO) Take 1 tablet by mouth daily.    Marland Kitchen ELIQUIS 5 MG TABS tablet TAKE 1 TABLET BY MOUTH 2 TIMES DAILY FOR  DVT 60 tablet 3  . Famotidine (PEPCID PO) Take by mouth.    . losartan (COZAAR) 25 MG tablet TAKE 1 TABLET BY MOUTH EVERY DAY 90 tablet 1  . vitamin B-12 (CYANOCOBALAMIN) 1000 MCG tablet Take 1,000 mcg by mouth 3 (three) times a week.     No facility-administered medications prior to visit.    PAST MEDICAL HISTORY: Past Medical History:  Diagnosis Date  . Cyst 07-2008   Cyst on back x2 that were drained  . H/O echocardiogram 02-2000  . Hx of colonic polyps   . Hyperlipidemia   . Hypertension   . Varicose veins of both lower extremities     PAST SURGICAL HISTORY: Past Surgical History:  Procedure Laterality Date  . POLYPECTOMY     Colon  . TONSILLECTOMY    . TUBAL LIGATION      FAMILY HISTORY: Family History  Problem Relation Age of Onset  . Hypertension Mother   . Parkinsonism Mother   . Angina Father   . Heart disease Father   . Colon cancer Father   . Lung cancer Father   . Skin cancer Father   . Cancer Father        Heart  . Breast cancer Sister        over 90  . Diabetes Sister   . Heart disease Brother 20  triple bypass surgery  . Macular degeneration Sister   . Diabetes Sister        prediabetic  . Hearing loss Sister        hearing problems  . Vasculitis Sister     SOCIAL HISTORY: Social History   Socioeconomic History  . Marital status: Married    Spouse name: [name]  . Number of children: 2  . Years of education: Not on file  . Highest education level: Doctorate  Occupational History  . Occupation: retired    Comment: Social research officer, government [name]  Tobacco Use  . Smoking status: Never Smoker  . Smokeless tobacco: Never Used  Substance and Sexual Activity  . Alcohol use: Yes    Alcohol/week: 7.0 standard drinks    Types: 7 Glasses of wine per week    Comment: one a day  . Drug use: Not Currently  . Sexual activity: Not on file  Other Topics Concern  . Not on file  Social History Narrative   Regular exercise-yes   Moved back to [place]  from [place] of 2 cat   G3 P2    Exercises walking regu;arly    Retired  Social research officer, government   Drinks one cup of coffee  A day. In addition to walking QD she and her husband take a pilates class on Thursdays. She lives with her husband in a 2 story house, though the master bedroom in on the first level.       Social Determinants of Health   Financial Resource Strain: Low Risk   . Difficulty of Paying Living Expenses: Not hard at all  Food Insecurity: No Food Insecurity  . Worried About Programme researcher, broadcasting/film/video in the Last Year: Never true  . Ran Out of Food in the Last Year: Never true  Transportation Needs: No Transportation Needs  . Lack of Transportation (Medical): No  . Lack of Transportation (Non-Medical): No  Physical Activity: Insufficiently Active  . Days of Exercise per Week: 3 days  . Minutes of Exercise per Session: 40 min  Stress: No Stress Concern Present  . Feeling of Stress : Not at all  Social Connections: Moderately Isolated  . Frequency of Communication with Friends and Family: Never  . Frequency of Social Gatherings with Friends and Family: More than three times a week  . Attends Religious Services: Never  . Active Member of Clubs or Organizations: No  . Attends Banker Meetings: Never  . Marital Status: Married  Catering manager Violence: Not At Risk  . Fear of Current or Ex-Partner: No  . Emotionally Abused: No  . Physically Abused: No  . Sexually Abused: No      PHYSICAL EXAM  Vitals:   08/12/20 1132  BP: (!) 155/74  Pulse: 82   There is no height or weight on file to calculate BMI.  Generalized: Well developed, in no acute distress   Neurological examination  Mentation: Alert.  Expressive aphasia noted Cranial nerve II-XII: Pupils were equal round reactive to light.  Unable to complete vertical gaze.  Able to complete horizontal gaze to the right-turn the whole head when looking to the left.  Facial sensation and strength were  normal. Motor: The motor testing reveals 5 over 5 strength of all 4 extremities. Good symmetric motor tone is noted throughout.  Sensory: Sensory testing is intact to soft touch on all 4 extremities. No evidence of extinction is noted.  Coordination: Cerebellar testing reveals good finger-nose-finger and heel-to-shin  bilaterally.  Gait and station: Gait is normal. Reflexes: Deep tendon reflexes are symmetric and normal bilaterally.   DIAGNOSTIC DATA (LABS, IMAGING, TESTING) - I reviewed patient records, labs, notes, testing and imaging myself where available.  Lab Results  Component Value Date   WBC 7.6 12/22/2019   HGB 14.3 12/22/2019   HCT 42.8 12/22/2019   MCV 89.7 12/22/2019   PLT 300 12/22/2019      Component Value Date/Time   NA 140 12/22/2019 1610   NA 141 04/12/2018 1405   K 4.3 12/22/2019 1610   CL 105 12/22/2019 1610   CO2 25 12/22/2019 1610   GLUCOSE 106 (H) 12/22/2019 1610   BUN 20 12/22/2019 1610   BUN 14 04/12/2018 1405   CREATININE 1.13 (H) 12/22/2019 1610   CALCIUM 9.7 12/22/2019 1610   PROT 6.9 12/22/2019 1610   PROT 7.0 04/12/2018 1405   ALBUMIN 4.1 12/23/2018 1017   ALBUMIN 4.0 04/12/2018 1405   AST 18 12/22/2019 1610   ALT 9 12/22/2019 1610   ALKPHOS 79 12/23/2018 1017   BILITOT 0.4 12/22/2019 1610   BILITOT 0.3 04/12/2018 1405   GFRNONAA 46 (L) 12/22/2019 1610   GFRAA 53 (L) 12/22/2019 1610   Lab Results  Component Value Date   CHOL 202 (H) 12/22/2019   HDL 69 12/22/2019   LDLCALC 111 (H) 12/22/2019   LDLDIRECT 191.8 07/22/2012   TRIG 110 12/22/2019   CHOLHDL 2.9 12/22/2019   Lab Results  Component Value Date   HGBA1C 5.7 12/23/2018   Lab Results  Component Value Date   VITAMINB12 1,421 (H) 12/23/2018   Lab Results  Component Value Date   TSH 2.10 12/22/2019      ASSESSMENT AND PLAN 80 y.o. year old female  has a past medical history of Cyst (07-2008), H/O echocardiogram (02-2000), colonic polyps, Hyperlipidemia, Hypertension, and  Varicose veins of both lower extremities. here with:  1.  Progressive supranuclear palsy  --Continue to monitor symptoms -- Husband discussed palliative care plans to discuss this with his PCP at the visit in August. -- Follow-up in 6 months or sooner if needed   I spent 33 minutes of face-to-face and non-face-to-face time with patient.  This included previsit chart review, discussed with the family prognosis and plan of care.  Discussed resources for additional care as well as palliative care.  Butch Penny, MSN, NP-C 08/12/2020, 11:46 AM [facility] [address] [place], [place] [address] [phone]

## 2020-08-16 ENCOUNTER — Encounter: Payer: Self-pay | Admitting: Neurology

## 2020-10-08 ENCOUNTER — Other Ambulatory Visit: Payer: Self-pay | Admitting: Internal Medicine

## 2020-10-23 ENCOUNTER — Telehealth: Payer: Self-pay

## 2020-10-23 NOTE — Telephone Encounter (Signed)
(  12:56 pm)  Palliative Care attempted to call and scheduled initial palliative care visit with patient or her husband. SW left a message for patient/husband  requesting a call back.

## 2020-11-02 ENCOUNTER — Other Ambulatory Visit: Payer: Self-pay | Admitting: Internal Medicine

## 2020-11-26 ENCOUNTER — Other Ambulatory Visit: Payer: Medicare PPO

## 2020-11-26 ENCOUNTER — Other Ambulatory Visit: Payer: Medicare PPO | Admitting: *Deleted

## 2020-11-26 ENCOUNTER — Other Ambulatory Visit: Payer: Self-pay

## 2020-11-26 DIAGNOSIS — Z515 Encounter for palliative care: Secondary | ICD-10-CM

## 2020-11-27 NOTE — Progress Notes (Signed)
[facility] RN NOTE  PATIENT NAME: Pamela Alvarez DOB: 12/14/1940 MRN: [id number]  PRIMARY CARE PROVIDER: Panosh, [name], MD  RESPONSIBLE PARTY: Pamela Alvarez (husband) Acct ID - Guarantor Home Phone Work Phone Relationship Acct Type  [id number] - Eriksson,Emersen [phone]  Self P/F     [address], [place], [place] [address]   Covid-19 Pre-screening Negative  PLAN OF CARE and INTERVENTION:  ADVANCE CARE PLANNING/GOALS OF CARE: Goal is for patient to remain at home with her husband with assistance from hired caregivers.  PATIENT/CAREGIVER EDUCATION: Explained palliative care services, symptom management, safe transfers DISEASE STATUS: Joint visit completed with LCSW, M. Lonon. Met with patient, husband Pamela and hired caregiver [name]. Upon arrival, patient is sitting up in her recliner awake and alert. Patient has a diagnosis of progressive supranuclear palsy. She is intermittently confused and only able to answer questions mainly with a yes or no response. Other verbalizations are unintelligible. Often times she answered both yes and no to questions asked. No physical indicators of pain noted initially, mainly laughing. Later in the visit patient started sitting up in her recliner, grimacing and whining and her eyes starting to water. Husband has a difficult time knowing if she is actually in pain. She appears to start getting restless, but then would sit back and calm. This occurred several times during visit. Respirations are even, regular and unlabored. She is total care with all ADLs. She is non-ambulatory. She is transferred via 1-2 person assistance and transported via wheelchair. Her appetite is good at this time. She does have some difficulty swallowing and coughs occasionally when drinking thin liquids. It is not excessive at this time, but husband says he has Thick-it available if this becomes more of an issue. She is taking her medications whole with water at this  time without difficulty. She is incontinent of both bowel and bladder and wears adult briefs. Patient has no open areas, but does have some areas of redness on her bottom which are stable. Husband is increasing the caregiver's hours to 8 hours/daily vs 4 hours on December 07, 2020. She has an upcoming appointment for her annual physical with her PCP in October 2022. He is agreeable to future visits from palliative care. Consent signed.   HISTORY OF PRESENT ILLNESS:  This is a 79 yo female with a diagnosis if progressive supranuclear palsy. She has a history of encephalomalacia, hyperlipidemia, anxiety and arthritis. Palliative care team has been asked to follow patient   CODE STATUS: Full code  ADVANCED DIRECTIVES: N MOST FORM: no PPS: 30%   (Duration of visit and documentation 60 minutes)    , RN BSN  

## 2020-11-29 NOTE — Progress Notes (Signed)
COMMUNITY PALLIATIVE CARE SW NOTE  PATIENT NAME: Pamela Alvarez DOB: 08/02/1940 MRN: [id number]  PRIMARY CARE PROVIDER: [name], MD  RESPONSIBLE PARTY:  Acct ID - Guarantor Home Phone Work Phone Relationship Acct Type  [id number] [name] [phone]  Self P/F     [address], [place], [place] [address]     PLAN OF CARE and INTERVENTIONS:             GOALS OF CARE/ ADVANCE CARE PLANNING:  Goal is for patient to remain at home with her husband. Patient is a full code.  SOCIAL/EMOTIONAL/SPIRITUAL ASSESSMENT/ INTERVENTIONS:  SW and RN-M. Dimas Aguas completed an initial visit with patient at her home, where she was present with her husband/PCG and sitter-R. Hill. Patient was sitting in a chair, awake and alert. Patient has speech deficits, and slow processing where most verbalizations are nonsensical in nature. She can respond to simple yes/no questions, but they are delayed.  Education was provided to [facility] and patient regarding palliative care services; verbal consent was provided by the PCG and patient. Patient is diagnosis of progressive supranuclear palsy. She is intermittently confused. Patient denied pain. As her husband provided an update, patient became tearful. Patient total care with ADL's. She is incontinent of bowel of bladder and wears adult briefs. She is a 1-2 person assist for transfers to her wheelchair. She is transported via wheelchair to appointments and for a daily walks with her husband. Her appetite is good, but does have some intermittent swallowing and coughing with food and fluids. Her husband reports that they have Thick-It available if the swallowing and coughing persists. Patient has a private caregiver 4 hours a day, with plans to increase the support 8 hours on October 1st. SW provided supportive presence, active listening, reassurance of support, while assessing patient's needs and comfort, coping of the caregiver and observation.  PATIENT/CAREGIVER EDUCATION/ COPING:   PCG is open to ongoing support by the team. SW provided education regarding palliative care  support.  PERSONAL EMERGENCY PLAN:  911 can be activated for emergencies. COMMUNITY RESOURCES COORDINATION/ HEALTH CARE NAVIGATION:  Patient has a private caregiver. FINANCIAL/LEGAL CONCERNS/INTERVENTIONS:  None.     SOCIAL HX:  Social History   Tobacco Use   Smoking status: Never   Smokeless tobacco: Never  Substance Use Topics   Alcohol use: Yes    Alcohol/week: 7.0 standard drinks    Types: 7 Glasses of wine per week    Comment: one a day    CODE STATUS: Full Code ADVANCED DIRECTIVES: Yes MOST FORM COMPLETE:  No HOSPICE EDUCATION PROVIDED: No  PPS: Patient is alert and oriented to self. She is total care.  Documentation and duration of visit: 60 minutes   Clydia Llano, LCSW

## 2020-12-25 ENCOUNTER — Encounter: Payer: Self-pay | Admitting: Internal Medicine

## 2020-12-25 ENCOUNTER — Ambulatory Visit (INDEPENDENT_AMBULATORY_CARE_PROVIDER_SITE_OTHER): Payer: Medicare PPO | Admitting: Internal Medicine

## 2020-12-25 ENCOUNTER — Other Ambulatory Visit: Payer: Self-pay

## 2020-12-25 VITALS — BP 150/70 | HR 84 | Temp 96.4°F | Ht 62.0 in | Wt 155.2 lb

## 2020-12-25 DIAGNOSIS — R1310 Dysphagia, unspecified: Secondary | ICD-10-CM

## 2020-12-25 DIAGNOSIS — E785 Hyperlipidemia, unspecified: Secondary | ICD-10-CM

## 2020-12-25 DIAGNOSIS — I10 Essential (primary) hypertension: Secondary | ICD-10-CM

## 2020-12-25 DIAGNOSIS — Z86718 Personal history of other venous thrombosis and embolism: Secondary | ICD-10-CM | POA: Diagnosis not present

## 2020-12-25 DIAGNOSIS — Z79899 Other long term (current) drug therapy: Secondary | ICD-10-CM | POA: Diagnosis not present

## 2020-12-25 DIAGNOSIS — Z0000 Encounter for general adult medical examination without abnormal findings: Secondary | ICD-10-CM | POA: Diagnosis not present

## 2020-12-25 DIAGNOSIS — G231 Progressive supranuclear ophthalmoplegia [Steele-Richardson-Olszewski]: Secondary | ICD-10-CM | POA: Diagnosis not present

## 2020-12-25 DIAGNOSIS — Z7901 Long term (current) use of anticoagulants: Secondary | ICD-10-CM | POA: Diagnosis not present

## 2020-12-25 LAB — LIPID PANEL
Cholesterol: 222 mg/dL — ABNORMAL HIGH (ref 0–200)
HDL: 75.6 mg/dL (ref >39.00)
LDL Cholesterol: 129 mg/dL — ABNORMAL HIGH (ref 0–99)
NonHDL: 146.03
Total CHOL/HDL Ratio: 3
Triglycerides: 85 mg/dL (ref 0.0–149.0)
VLDL: 17 mg/dL (ref 0.0–40.0)

## 2020-12-25 LAB — CBC WITH DIFFERENTIAL/PLATELET
Basophils Absolute: 0.1 10*3/uL (ref 0.0–0.1)
Basophils Relative: 0.9 % (ref 0.0–3.0)
Eosinophils Absolute: 0.1 10*3/uL (ref 0.0–0.7)
Eosinophils Relative: 1.7 % (ref 0.0–5.0)
HCT: 43 % (ref 36.0–46.0)
Hemoglobin: 14.2 g/dL (ref 12.0–15.0)
Lymphocytes Relative: 30.3 % (ref 12.0–46.0)
Lymphs Abs: 2.6 10*3/uL (ref 0.7–4.0)
MCHC: 33 g/dL (ref 30.0–36.0)
MCV: 91.8 fl (ref 78.0–100.0)
Monocytes Absolute: 0.6 10*3/uL (ref 0.1–1.0)
Monocytes Relative: 6.9 % (ref 3.0–12.0)
Neutro Abs: 5.1 10*3/uL (ref 1.4–7.7)
Neutrophils Relative %: 60.2 % (ref 43.0–77.0)
Platelets: 236 10*3/uL (ref 150.0–400.0)
RBC: 4.69 Mil/uL (ref 3.87–5.11)
RDW: 14.1 % (ref 11.5–15.5)
WBC: 8.5 10*3/uL (ref 4.0–10.5)

## 2020-12-25 LAB — HEPATIC FUNCTION PANEL
ALT: 22 U/L (ref 0–35)
AST: 25 U/L (ref 0–37)
Albumin: 3.9 g/dL (ref 3.5–5.2)
Alkaline Phosphatase: 76 U/L (ref 39–117)
Bilirubin, Direct: 0.1 mg/dL (ref 0.0–0.3)
Total Bilirubin: 0.7 mg/dL (ref 0.2–1.2)
Total Protein: 7.1 g/dL (ref 6.0–8.3)

## 2020-12-25 LAB — BASIC METABOLIC PANEL
BUN: 20 mg/dL (ref 6–23)
CO2: 23 mEq/L (ref 19–32)
Calcium: 9.6 mg/dL (ref 8.4–10.5)
Chloride: 106 mEq/L (ref 96–112)
Creatinine, Ser: 1.13 mg/dL (ref 0.40–1.20)
GFR: 45.78 mL/min — ABNORMAL LOW (ref >60.00)
Glucose, Bld: 77 mg/dL (ref 70–99)
Potassium: 4 mEq/L (ref 3.5–5.1)
Sodium: 140 mEq/L (ref 135–145)

## 2020-12-25 LAB — TSH: TSH: 3.27 u[IU]/mL (ref 0.35–5.50)

## 2020-12-25 LAB — HEMOGLOBIN A1C: Hgb A1c MFr Bld: 5.3 % (ref 4.6–6.5)

## 2020-12-25 NOTE — Progress Notes (Signed)
Chief Complaint  Patient presents with   Annual Exam    HPI: Patient  Pamela Alvarez  80 y.o. comes in today for Preventive Health Care visit  and ongoing medical issues  is in palliative care she is here with her spouse and attended [name] who is helping 8 hours a day 5 days a week.  Her progressive supranuclear palsy has progressed somewhat She can walk but cannot arise from a wheelchair needs help with pretty much everything except can use her right hand with a cup can drink.   Bowels are pretty steady when titrating with MiraLAX which is helped her urine and wetting and able to sleep through the night no UTI symptoms recently.  Sometimes it is hard to swallow the pills he is on losartan for hypertension and Lipitor for statin statin HLD. Also continuing on Eliquis husband has been measuring her legs to make sure no asymmetry occurs. No bruising and bleeding falling side effects of anticoagulation noted. Progresive  SNPalsy   Health Maintenance  Topic Date Due   COVID-19 Vaccine (5 - Booster for Pfizer series) 11/19/2020   TETANUS/TDAP  02/26/2021 (Originally 07/08/2018)   Pneumonia Vaccine 57+ Years old  Completed   INFLUENZA VACCINE  Completed   Zoster Vaccines- Shingrix  Completed   HPV VACCINES  Aged Out   DEXA SCAN  Discontinued   HH of 2  husband caretaker Okey Dupre is attendant help her.  ROS:   as per HPI   Past Medical History:  Diagnosis Date   Cyst 07-2008   Cyst on back x2 that were drained   H/O echocardiogram 02-2000   Hx of colonic polyps    Hyperlipidemia    Hypertension    Varicose veins of both lower extremities     Past Surgical History:  Procedure Laterality Date   POLYPECTOMY     Colon   TONSILLECTOMY     TUBAL LIGATION      Family History  Problem Relation Age of Onset   Hypertension Mother    Parkinsonism Mother    Angina Father    Heart disease Father    Colon cancer Father    Lung cancer Father    Skin cancer Father    Cancer Father         Heart   Breast cancer Sister        over 57   Diabetes Sister    Heart disease Brother 88       triple bypass surgery   Macular degeneration Sister    Diabetes Sister        prediabetic   Hearing loss Sister        hearing problems   Vasculitis Sister     Social History   Socioeconomic History   Marital status: Married    Spouse name: [name]   Number of children: 2   Years of education: Not on file   Highest education level: Doctorate  Occupational History   Occupation: retired    Comment: Social research officer, government [name]  Tobacco Use   Smoking status: Never   Smokeless tobacco: Never  Substance and Sexual Activity   Alcohol use: Yes    Alcohol/week: 7.0 standard drinks    Types: 7 Glasses of wine per week    Comment: one a day   Drug use: Not Currently   Sexual activity: Not on file  Other Topics Concern   Not on file  Social History Narrative   Regular exercise-yes   Moved  back to [place] from [place] of 2 cat   G3 P2    Exercises walking regu;arly    Retired  Social research officer, government   Drinks one cup of coffee  A day. In addition to walking QD she and her husband take a pilates class on Thursdays. She lives with her husband in a 2 story house, though the master bedroom in on the first level.       Social Determinants of Health   Financial Resource Strain: Low Risk    Difficulty of Paying Living Expenses: Not hard at all  Food Insecurity: No Food Insecurity   Worried About Programme researcher, broadcasting/film/video in the Last Year: Never true   Ran Out of Food in the Last Year: Never true  Transportation Needs: No Transportation Needs   Lack of Transportation (Medical): No   Lack of Transportation (Non-Medical): No  Physical Activity: Insufficiently Active   Days of Exercise per Week: 3 days   Minutes of Exercise per Session: 40 min  Stress: No Stress Concern Present   Feeling of Stress : Not at all  Social Connections: Moderately Isolated   Frequency of Communication with Friends and  Family: Never   Frequency of Social Gatherings with Friends and Family: More than three times a week   Attends Religious Services: Never   Database administrator or Organizations: No   Attends Engineer, structural: Never   Marital Status: Married    Outpatient Medications Prior to Visit  Medication Sig Dispense Refill   atorvastatin (LIPITOR) 10 MG tablet TAKE 1 TABLET BY MOUTH EVERY DAY 90 tablet 2   Cholecalciferol (VITAMIN D3 PO) Take 1 tablet by mouth daily.     ELIQUIS 5 MG TABS tablet TAKE 1 TABLET BY MOUTH 2 TIMES DAILY FOR DVT 60 tablet 3   Famotidine (PEPCID PO) Take by mouth.     losartan (COZAAR) 25 MG tablet TAKE 1 TABLET BY MOUTH EVERY DAY 90 tablet 0   vitamin B-12 (CYANOCOBALAMIN) 1000 MCG tablet Take 1,000 mcg by mouth 3 (three) times a week.     No facility-administered medications prior to visit.     EXAM:  BP (!) 150/70 (BP Location: Left Arm, Patient Position: Sitting, Cuff Size: Normal)   Pulse 84   Temp (!) 96.4 F (35.8 C) (Oral)   Ht 5\' 2"  (1.575 m)   Wt 155 lb 3.2 oz (70.4 kg)   SpO2 96%   BMI 28.39 kg/m   Body mass index is 28.39 kg/m. Wt Readings from Last 3 Encounters:  12/25/20 155 lb 3.2 oz (70.4 kg)  05/14/20 148 lb (67.1 kg)  04/08/20 150 lb 9.6 oz (68.3 kg)    Physical Exam: Vital signs reviewed 04/10/20 is a well-developed well-nourished alert cooperative in wheelchair who appearsr stated age in no acute distress.  In wheelchair emotional at times will interact acknowledge decreased left arm use no acute tremor. HEENT: normocephalic atraumatic , Eyes: PERRL glasses conjunctiva clear, , Ears: no deformity EAC's clear TMs with normal landmarks. Mouth: Masked NECK: supple without masses, thyromegaly or bruits. CHEST/PULM:  Clear to auscultation and percussion breath sounds equal no wheeze , rales or rhonchi.  Not taking deep breaths on command.  But no respiratory distress Breast: normal by inspection . No dimpling, discharge,  masses, tenderness or discharge .  Famine sitting in chair CV: PMI is nondisplaced, S1 S2 no gallops, murmurs, rubs. Peripheral pulses are full without delay.No JVD .  ABDOMEN: Bowel  sounds normal nontender  No guard or rebound, no hepato splenomegal no CVA tenderness Extremtities:  No clubbing cyanosis or edema, no acute joint swelling or redness no focal atrophy left arm at side crease shoulder movement but can move at elbow and wrist.  Right arm movable acute swelling. NEURO: Alert interactive but now sentence for bills.  SKIN: No acute rashes normal turgor, color, no bruising or petechiae. LN: no cervical axillary denopathy  Lab Results  Component Value Date   WBC 8.5 12/25/2020   HGB 14.2 12/25/2020   HCT 43.0 12/25/2020   PLT 236.0 12/25/2020   GLUCOSE 77 12/25/2020   CHOL 222 (H) 12/25/2020   TRIG 85.0 12/25/2020   HDL 75.60 12/25/2020   LDLDIRECT 191.8 07/22/2012   LDLCALC 129 (H) 12/25/2020   ALT 22 12/25/2020   AST 25 12/25/2020   NA 140 12/25/2020   K 4.0 12/25/2020   CL 106 12/25/2020   CREATININE 1.13 12/25/2020   BUN 20 12/25/2020   CO2 23 12/25/2020   TSH 3.27 12/25/2020   HGBA1C 5.3 12/25/2020    BP Readings from Last 3 Encounters:  12/25/20 (!) 150/70  08/12/20 (!) 155/74  05/14/20 138/88    Lab plan reviewed with patient  and caretakers  ASSESSMENT AND PLAN:  Discussed the following assessment and plan:    ICD-10-CM   1. Visit for preventive health examination  Z00.00     2. Chronic anticoagulation  Z79.01 Basic metabolic panel    Hemoglobin A1c    CBC with Differential/Platelet    Hepatic function panel    Lipid panel    TSH    TSH    Lipid panel    Hepatic function panel    CBC with Differential/Platelet    Hemoglobin A1c    Basic metabolic panel    3. Medication management  Z79.899 Basic metabolic panel    Hemoglobin A1c    CBC with Differential/Platelet    Hepatic function panel    Lipid panel    TSH    TSH    Lipid panel     Hepatic function panel    CBC with Differential/Platelet    Hemoglobin A1c    Basic metabolic panel    4. Progressive supranuclear palsy (HCC)  G23.1 Basic metabolic panel    Hemoglobin A1c    CBC with Differential/Platelet    Hepatic function panel    Lipid panel    TSH    TSH    Lipid panel    Hepatic function panel    CBC with Differential/Platelet    Hemoglobin A1c    Basic metabolic panel    5. Personal history of DVT (deep vein thrombosis)  Z86.718 Basic metabolic panel    Hemoglobin A1c    CBC with Differential/Platelet    Hepatic function panel    Lipid panel    TSH    TSH    Lipid panel    Hepatic function panel    CBC with Differential/Platelet    Hemoglobin A1c    Basic metabolic panel    6. Hyperlipidemia, unspecified hyperlipidemia type  E78.5 Basic metabolic panel    Hemoglobin A1c    CBC with Differential/Platelet    Hepatic function panel    Lipid panel    TSH    TSH    Lipid panel    Hepatic function panel    CBC with Differential/Platelet    Hemoglobin A1c    Basic metabolic panel    7.  Essential hypertension  I10 Basic metabolic panel    Hemoglobin A1c    CBC with Differential/Platelet    Hepatic function panel    Lipid panel    TSH    TSH    Lipid panel    Hepatic function panel    CBC with Differential/Platelet    Hemoglobin A1c    Basic metabolic panel    8. Dysphagia, unspecified type  R13.10      progression is anxious about any testing or new medicine She will probably need a swallowing test could be ordered by Dr. Vickey Huger she has great anxiety around testing.  And would opine to Dr. Vickey Huger about any medicine for anxiety if indicated. We can reach out to pharmacy team about swallowing difficulty in getting medicine.  ? If pt rom exercise  shoulder may help  Return for depending on results 6 mos .  Patient Care Team: Travaughn Vue, Neta Mends, MD as PCP - Benay Pike, MD (Gastroenterology) Elise Benne, MD  (Ophthalmology) Venancio Poisson, MD as Attending Physician (Dermatology) Enid Baas, MD as Consulting Physician (Sports Medicine) Teryl Lucy, MD as Consulting Physician (Orthopedic Surgery) Andrena Mews, DO as Consulting Physician (Sports Medicine) Sedalia Muta, PT as Physical Therapist (Physical Therapy) Dohmeier, Porfirio Mylar, MD as Consulting Physician (Neurology) Patient Instructions  Good to see all of you today .  Will  ask pharmacy to  reach out about  ability to crush  or take meds easier.  Will send info to dr Dohmeier  about swallowing eval and future meds if needed for anxiety.   Will notify you  of labs when available.   Plan fu depending .  At this time stay on the eliquis.  I think benefit more than risk at this time.    Neta Mends. Yoshino Broccoli M.D.

## 2020-12-25 NOTE — Patient Instructions (Addendum)
Good to see all of you today .  Will  ask pharmacy to  reach out about  ability to crush  or take meds easier.  Will send info to dr Dohmeier  about swallowing eval and future meds if needed for anxiety.   Will notify you  of labs when available.   Plan fu depending .  At this time stay on the eliquis.  I think benefit more than risk at this time.

## 2020-12-26 NOTE — Progress Notes (Signed)
Blood results stable

## 2021-01-01 ENCOUNTER — Telehealth: Payer: Self-pay | Admitting: Internal Medicine

## 2021-01-01 NOTE — Progress Notes (Signed)
  Chronic Care Management   Note  01/01/2021 Name: Pamela Alvarez MRN: [id number] DOB: 10/10/1940  Pamela Alvarez is a 80 y.o. year old female who is a primary care patient of Panosh, Neta Mends, MD. I reached out to Pamela Alvarez by phone today in response to a referral sent by Pamela Alvarez's PCP, Panosh, Neta Mends, MD.   Pamela Alvarez was given information about Chronic Care Management services today including:  CCM service includes personalized support from designated clinical staff supervised by her physician, including individualized plan of care and coordination with other care providers 24/7 contact phone numbers for assistance for urgent and routine care needs. Service will only be billed when office clinical staff spend 20 minutes or more in a month to coordinate care. Only one practitioner may furnish and bill the service in a calendar month. The patient may stop CCM services at any time (effective at the end of the month) by phone call to the office staff.   Pamela Alvarez  verbally agreed to assistance and services provided by embedded care coordination/care management team today.  Follow up plan:   Pamela Alvarez, Pamela Alvarez

## 2021-01-04 ENCOUNTER — Other Ambulatory Visit: Payer: Self-pay | Admitting: Internal Medicine

## 2021-02-06 DIAGNOSIS — D485 Neoplasm of uncertain behavior of skin: Secondary | ICD-10-CM | POA: Diagnosis not present

## 2021-02-06 DIAGNOSIS — L300 Nummular dermatitis: Secondary | ICD-10-CM | POA: Diagnosis not present

## 2021-02-06 DIAGNOSIS — L820 Inflamed seborrheic keratosis: Secondary | ICD-10-CM | POA: Diagnosis not present

## 2021-02-06 DIAGNOSIS — L570 Actinic keratosis: Secondary | ICD-10-CM | POA: Diagnosis not present

## 2021-02-06 DIAGNOSIS — L821 Other seborrheic keratosis: Secondary | ICD-10-CM | POA: Diagnosis not present

## 2021-02-07 ENCOUNTER — Telehealth: Payer: Self-pay | Admitting: Internal Medicine

## 2021-02-07 MED ORDER — ATORVASTATIN CALCIUM 10 MG PO TABS
10.0000 mg | ORAL_TABLET | Freq: Every day | ORAL | 2 refills | Status: DC
Start: 2021-02-07 — End: 2021-03-18

## 2021-02-07 NOTE — Telephone Encounter (Signed)
Pt husband Leonette Most is calling and wife needs refill on atorvastatin (LIPITOR) 10 MG tablet. Per husband pharm no longer has record on medication  [id number] - [place], [place] - [address] AT [facility] [address] Phone:  [phone]  Fax:  [phone]

## 2021-02-07 NOTE — Telephone Encounter (Signed)
Rx sent 

## 2021-02-10 ENCOUNTER — Telehealth: Payer: Self-pay | Admitting: Neurology

## 2021-02-10 NOTE — Telephone Encounter (Signed)
Changed appt time to 12:30 due to Dr. Vickey Huger being out of office that morning. LVM and sent mychart msg informing pt of time change.

## 2021-02-12 ENCOUNTER — Encounter: Payer: Self-pay | Admitting: Neurology

## 2021-02-12 ENCOUNTER — Ambulatory Visit: Payer: Medicare PPO | Admitting: Neurology

## 2021-02-12 VITALS — BP 147/82 | HR 94 | Ht 62.0 in | Wt 154.5 lb

## 2021-02-12 DIAGNOSIS — G20 Parkinson's disease: Secondary | ICD-10-CM

## 2021-02-12 DIAGNOSIS — G231 Progressive supranuclear ophthalmoplegia [Steele-Richardson-Olszewski]: Secondary | ICD-10-CM | POA: Insufficient documentation

## 2021-02-12 NOTE — Progress Notes (Signed)
Provider:  Larey Seat, MD    Referring Provider: Burnis Medin, MD Primary Care Physician:  Burnis Medin, MD    HPI: Dr. [name], [name]  is a 80 y.o. female patient, and seen here in a Rv for PNP.  Rm 11, w husband and caregiver. Here for 6 month f/u. Pts husband reports more difficulty to walk. Ambulate in WC. L arm is more stiff and unable to move as much. Pt is able to talk a bit more. Pt was having difficulty swallowing but last month pt has been able to eat without chocking. Sleeps better at night.   Rv on 02-12-2020: The patient has progressed, is left with very limited mobility, right hand is still " of use", and left is not. There is a discussion about an electric wheelchair, if that is self directed how will she use it, direct it? There is little sense in repeating MMSE. [place] has made no difference in Mr. and Mrs Orantes's opinion.  They are content, she is much less frustrated when using the tablet. Aphasia is a cause of great difficulties in communication. DVT in October , now on Eliquis.     Progressing quickly over 30 month by history and in comparison to previous MMSE/ MOCA.  mini-mental status exam tof day ended in 9 points.  Severe dementia.  The couple visited their children and grandchildren in June , by road trip- 3 weeks. Staying in hotels /motels.  Her face is masked. She can hold small- talk. Now in a wheelchair. Left sided weakness, at home PT has noted increased rigidity, left arm clumsy and left foot everted. .  04-2018 Mr. Stringfield reports that their children have had Covid 19 in [place] and in [place]. There is also family in [facility] that wanted to visit , but the [place] declined. The profile today Mini-Mental Status Examination in which the patient scored 15 points.  She presented with a slightly elevated blood pressure today to the office.  Since August she had completed physical therapy and speech therapy which has helped with some strengthening.  And  prior to Christmas she had completed her physical therapy.  But after physical therapy sees she has also began again to have some ambulation difficulties.  It seems that we need an ongoing may be group exercise for her and I wonder if there could be a Zoom or WebEx video that would help to encourage her to do some of these exercises in the home environment and with some guidance and perhaps even with correction of posture or other input.  She is doing Pilates on Zoom, but its very difficult for hr to follow instructions. Patient was seen here on 02-08-2018 in a referral from Dr. Regis Bill for a "second opinion " in the work up for memory loss. The patient reports having been stumped by the previously see neurologist in [place], who reportedly diagnosed her with Alzheimer's Dementia. 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 She has a [name] and reports she had some learning disability - not learning or memory related, but an inability to calculate.   Dr. Kizzie Furnish had always been acute aware of her learning abilities, but over the last couple of years there has been evidence of a declining cognitive ability.  The patient broke her ankle at the time and while she recovered from this supposedly only bony injury there were several changes, she felt always cold it was as if  her enough thermostat had changed, she also noted and memory decline, may be a personality change, she had trouble retrieving words and her vocabulary became progressively restricted, her speech poor. She has ever since gait and balance impairment. She is in PT twice weekly.   Family history - mother had PD.father had CAD, died at 75 years old, cancer.  Social history- [name], no history of tobacco , ETOH- used to have one glass of wine with dinner, now 2-3 days a week.  No  recreatinonal drugs. 2 sons , aged 70 and 42.    RV 04-12-2018,  Plan: Eye-movement abnormalities now most like associated with the gliosis  and encephalomalcia and not neurodegeneratve. Vascular dementia.  Slowed EEG,  Abnormal gliosis or encephalomalacia in MRI - could this be an AVM? Fall related head trauma? Carotid vaso-vascular embolism?   Need MRA brain, non contrast.  Need carotid doppler first. PT, OT and ST to continue.  Rv in 2-3 month with me.   RV 10-27-2018, Rv  Due to the Coronavirus pandemia none of above tests were performed.  I explained again that the MRA is non contrast, no invasive.  New orders for ST, PT and OT will be needed. Her left hand is dominant and she has become very clumsy.   STUDY DATE: 10/29/2018 PATIENT NAME: Trae Yurchak DOB: 1940/09/03 MRN: [id number]   EXAM: MR angiogram of the intracranial arteries   ORDERING CLINICIAN: Asencion Partridge Titianna Loomis MD CLINICAL HISTORY: 80 year old woman with gait ataxia and acute cognitive changes COMPARISON FILMS: None   TECHNIQUE: MR angiogram of the head was obtained utilizing 3D time of flight sequences from below the vertebrobasilar junction up to the intracranial vasculature without contrast.  Computerized reconstructions were obtained. CONTRAST: None IMAGING SITE: [facility], [place], [place], [place]   FINDINGS: The imaged extracranial and intracranial portions of the internal carotid arteries appear normal. The middle cerebral and anterior cerebral arteries appear normal.    In the posterior circulation, the right vertebral artery is dominant.  The V4 segment of the left vertebral artery is not observed in the MIP reconstructions though some flow is noted on the source images.  Mild stenosis is noted within the proximal basilar artery.  There is a fetal origin of the left posterior cerebral artery.  Some luminal irregularity is noted in the P2 segment consistent with mild stenosis.   No aneurysms were identified.     IMPRESSION: This MR angiogram of the intracerebral arteries shows the following: 1.  Reduced flow is noted in the distal left  vertebral artery.  This could be due to stenosis of the V4 segment or pre-cerebral stenosis more proximally..  If clinically indicated, consider a contrasted MR angiogram of the neck arteries to further evaluate. 2.  Mild stenosis of the proximal basilar artery.   3.  Mild stenosis of the left posterior cerebral artery which has a fetal origin (variant).         INTERPRETING PHYSICIAN:  Richard A. Felecia Shelling, MD, [name], FAAN Certified in  Neuroimaging by [facility]   NEUROIMAGING REPORT     STUDY DATE: 03/15/18 PATIENT NAME: Saje Swearingen DOB: 12-Feb-1941 MRN: [id number]   ORDERING CLINICIAN: Larey Seat, MD  CLINICAL HISTORY: 80 year old female with memory loss and confusion.   EXAM: MRI brain (without)  TECHNIQUE: MRI of the brain without contrast was obtained utilizing 5 mm axial slices with T1, T2, T2 flair, SWI and diffusion weighted views.  T1  sagittal and T2 coronal views were obtained. CONTRAST: no (patient declined IV contrast) COMPARISON: none  IMAGING SITE: [facility] [address] (1.5 Tesla MRI)     FINDINGS:    Abnormal T2 FLAIR hyperintense signal in the right frontal, right parietal and right cerebellar regions, with encephalomalacia and gliosis.  However there also appears to be underlying heterogeneous lesions with surrounding vasogenic edema.  On SWI views, there is increased susceptibility within the rim of these regions as well indicating mineralization or hemosiderin deposition.  Considerations would include embolic chronic ischemic infarcts, although underlying neoplasm (metastases) cannot be excluded.    Elsewhere few punctate foci of nonspecific gliosis in the subcortical white matter.   No abnormal lesions are seen on diffusion-weighted views to suggest acute ischemia. The cortical sulci, fissures and cisterns are normal in size and appearance. Lateral, third and fourth ventricle are normal in  size and appearance. No extra-axial fluid collections are seen. No evidence of mass effect or midline shift.     On sagittal views the posterior fossa, pituitary gland and corpus callosum are unremarkable. No evidence of intracranial hemorrhage on SWI views. The orbits and their contents, paranasal sinuses and calvarium are unremarkable.  Intracranial flow voids are present.    IMPRESSION:  Abnormal MRI brain (without) demonstrating: - Right frontal, right parietal and right cerebellar encephalomalacia and gliosis.  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  Underlying neoplastic or vascular lesions cannot be excluded, and would recommend follow-up imaging with postcontrast views. - Mild chronic small vessel ischemic disease. - No acute findings.     INTERPRETING PHYSICIAN:  Suanne Marker, MD Certified in Neurology, Neurophysiology and Neuroimaging     Review of Systems: Out of a complete 14 system review, the patient complains of only the following symptoms, and all other reviewed systems are negative.   Left thumb sensoryloss, clumsy hand.  Mild  Dysphagia. Strong  Dysphonia.      Her face is masked. She can hold small- talk. Now in a wheelchair. Left sided weakness, at home PT has noted increased rigidity, left arm clumsy and left foot everted. .   Social History   Socioeconomic History   Marital status: Married    Spouse name: [name]   Number of children: 2   Years of education: Not on file   Highest education level: Doctorate  Occupational History   Occupation: retired    Comment: Social research officer, government [name]  Tobacco Use   Smoking status: Never   Smokeless tobacco: Never  Substance and Sexual Activity   Alcohol use: Yes    Alcohol/week: 7.0 standard drinks    Types: 7 Glasses of wine per week    Comment: one a day   Drug use: Not Currently   Sexual activity: Not on file  Other Topics Concern   Not on file  Social History Narrative   Regular  exercise-yes   Moved back to [place] from [place]   Hh of 2 cat   G3 P2    Exercises walking regu;arly    Retired  Social research officer, government   Drinks one cup of coffee  A day. In addition to walking QD she and her husband take a pilates class on Thursdays. She lives with her husband in a 2 story house, though the master bedroom in on the first level.       Social Determinants of Health   Financial Resource Strain: Low Risk    Difficulty of Paying Living Expenses: Not hard at  all  Food Insecurity: No Food Insecurity   Worried About Charity fundraiser in the Last Year: Never true   Ran Out of Food in the Last Year: Never true  Transportation Needs: No Transportation Needs   Lack of Transportation (Medical): No   Lack of Transportation (Non-Medical): No  Physical Activity: Insufficiently Active   Days of Exercise per Week: 3 days   Minutes of Exercise per Session: 40 min  Stress: No Stress Concern Present   Feeling of Stress : Not at all  Social Connections: Moderately Isolated   Frequency of Communication with Friends and Family: Never   Frequency of Social Gatherings with Friends and Family: More than three times a week   Attends Religious Services: Never   Marine scientist or Organizations: No   Attends Music therapist: Never   Marital Status: Married  Human resources officer Violence: Not At Risk   Fear of Current or Ex-Partner: No   Emotionally Abused: No   Physically Abused: No   Sexually Abused: No    Family History  Problem Relation Age of Onset   Hypertension Mother    Parkinsonism Mother    Angina Father    Heart disease Father    Colon cancer Father    Lung cancer Father    Skin cancer Father    Cancer Father        Heart   Breast cancer Sister        over 83   Diabetes Sister    Heart disease Brother 19       triple bypass surgery   Macular degeneration Sister    Diabetes Sister        prediabetic   Hearing loss Sister        hearing problems    Vasculitis Sister     Past Medical History:  Diagnosis Date   Cyst 07-2008   Cyst on back x2 that were drained   H/O echocardiogram 02-2000   Hx of colonic polyps    Hyperlipidemia    Hypertension    Varicose veins of both lower extremities     Past Surgical History:  Procedure Laterality Date   POLYPECTOMY     Colon   TONSILLECTOMY     TUBAL LIGATION      Current Outpatient Medications  Medication Sig Dispense Refill   atorvastatin (LIPITOR) 10 MG tablet Take 1 tablet (10 mg total) by mouth daily. 90 tablet 2   Cholecalciferol (VITAMIN D3 PO) Take 1 tablet by mouth daily.     ELIQUIS 5 MG TABS tablet TAKE 1 TABLET BY MOUTH 2 TIMES DAILY FOR DVT 60 tablet 3   Famotidine (PEPCID PO) Take by mouth.     losartan (COZAAR) 25 MG tablet TAKE 1 TABLET BY MOUTH EVERY DAY 90 tablet 0   vitamin B-12 (CYANOCOBALAMIN) 1000 MCG tablet Take 1,000 mcg by mouth 3 (three) times a week.     No current facility-administered medications for this visit.    Allergies as of 02/12/2021 - Review Complete 02/12/2021  Allergen Reaction Noted   Dust mite extract Cough 12/09/2015   Sulfonamide derivatives      Vitals: BP (!) 147/82   Pulse 94   Ht 5\' 2"  (1.575 m)   Wt 154 lb 8 oz (70.1 kg)   BMI 28.26 kg/m  Last Weight:  Wt Readings from Last 1 Encounters:  02/12/21 154 lb 8 oz (70.1 kg)   Last Height:   Ht Readings  from Last 1 Encounters:  02/12/21 5\' 2"  (1.575 m)    Physical exam:  General: The patient is awake, alert and appears not in acute distress. The patient is well groomed. Head: Normocephalic, atraumatic. Neck is supple. Cardiovascular:  Regular rate and rhythm , without  murmurs or carotid bruit, and without distended neck veins. Respiratory: Lungs are clear to auscultation. Skin:  Without evidence of edema, or rash Trunk: BMI is 27.1 kg/m2  elevated and patient  has normal posture.  Neurologic exam : The patient is awake and alert, oriented to place and time.  Memory  subjective described as impaired  There is an ab- normal attention span & concentration ability. Speech is non -fluent with aphasia. Mood and affect are meek.  Cranial nerves: Pupils are equal and briskly reactive to light. No EOM - she moves the whole head-   Funduscopic exam without  evidence of pallor or edema. Extraocular movements  in vertical planes restricted, neither able to pursuit upwards not downwards gaze-  Last time she had horizontal planes with sharp, coarse saccades, skipping the central vision without nystagmus.  Reduced, rare blink - masked face.   Confirmed twice that she cannot follow with downward gaze- Visual fields are restricted  Facial sensation intact to fine touch. Facial motor - reduced facial mimic.  Tongue and uvula move midline.  Tongue protrusion into either cheek is normal. Shoulder shrug is normal.   Motor exam: elevated tone, symmetric muscle bulk and symmetric strength in all extremities.  Sensory:  he seems to have higher sensitivity on the left, she did not show extinction.  Coordination: Rapid alternating movements in the fingers/hands were very much slowed Finger-to-nose maneuver with evidence of ataxia, dysmetria and just slow , slow. Gait and station: Patient in a wheelchair. Strength delayed -gait appeared stiff, no rotation -   Deep tendon reflexes: in the upper and lower extremities are brisk. Babinski maneuver response is  downgoing.  Assessment:  After physical and neurologic examination, review of laboratory studies, imaging, neurophysiology testing and pre-existing records, assessment is that of :  Neurodegenerative disease process - as we ruled out vascular injuries.  - Gliosis confirms the trauma is most likely the cause of scars unclear if these caused the dementia , but they apply to the hemilateral findings. - see MRI brain.  Progressive - oculomotor palsy, now not able to look up or downwards but also not moving eyes on the horizontal plane.   increased muscle tone, rigidity and propulsive fall risk now wheelchair bound . 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  Left thumb clumsiness. Now clearly increased rigidity in the left wrist, biceps. Hyperreflexia over both patellae.     Marland Kitchen  MMSE - Mini Mental State Exam 08/12/2020 10/12/2019 03/27/2019 02/08/2018  Not completed: Unable to complete - - -  Orientation to time - 0 2 1  Orientation to Place - 0 2 5  Registration - 3 3 3   Attention/ Calculation - 0 0 0  Recall - 2 2 1   Language- name 2 objects - 1 2 2   Language- repeat - 1 1 0  Language- follow 3 step command - 2 3 3   Language- read & follow direction - 0 0 1  Write a sentence - 0 0 0  Copy design - 0 0 0  Total score - 9 15 16       Progressive Supranuclear ocular palsy-  Aphasia, masked face.  Duration 35 minutes.  Plan:  Treatment plan and additional workup :  renewal orders for ST, PT and OT will be needed. I consider her now a " Wheelchair " patient, will be fitted for the right chair with the help of PT. Local vendor.  She will need a lift chair.   Home health care needed, and finally accepted by the patient, 40 hours a week.   Her left hand is dominant and she has become unable to use the dominant left hand Her voice is dysphonic and slurred, slowed.  Her face is masked. She can hold a liitle small- talk.  Now presenting in a wheelchair SHE IS NOT ABLE TO USE A WALKER, NOT ABLE TO READ but follows Tv shows. There is neck stiffness, stooping and now complete gaze paresis.  She can benefit from PT and massage therapy.       Rv in 6 months / Either by MD or  by NP,   I will be available for consultation.  Asencion Partridge Princess Karnes MD 02/12/2021

## 2021-02-15 ENCOUNTER — Other Ambulatory Visit: Payer: Self-pay | Admitting: Internal Medicine

## 2021-02-19 ENCOUNTER — Telehealth: Payer: Self-pay | Admitting: Neurology

## 2021-02-19 NOTE — Telephone Encounter (Signed)
Home health referral has been sent to [facility].

## 2021-02-24 ENCOUNTER — Ambulatory Visit (INDEPENDENT_AMBULATORY_CARE_PROVIDER_SITE_OTHER): Payer: Medicare PPO

## 2021-02-24 VITALS — Ht 62.0 in | Wt 152.0 lb

## 2021-02-24 DIAGNOSIS — Z0000 Encounter for general adult medical examination without abnormal findings: Secondary | ICD-10-CM | POA: Diagnosis not present

## 2021-02-24 NOTE — Progress Notes (Signed)
I connected with Pamela Alvarez today by telephone and verified that we are speaking about the correct person using two identifiers. Location patient: home Location provider: work Persons participating in the virtual visit: Cheila Glacken, Pamela Alvarez (spouse), Glenna Durand LPN.   I discussed the limitations, risks, security and privacy concerns of performing an evaluation and management service by telephone and the availability of in person appointments. I also discussed with the patient that there may be a patient responsible charge related to this service. The patient expressed understanding and verbally consented to this telephonic visit.    Interactive audio and video telecommunications were attempted between this provider and patient, however failed, due to patient having technical difficulties OR patient did not have access to video capability.  We continued and completed visit with audio only.     Vital signs may be patient reported or missing.  Subjective:   Pamela Alvarez is a 80 y.o. female who presents for Medicare Annual (Subsequent) preventive examination.  Review of Systems     Cardiac Risk Factors include: advanced age (>69men, >44 women)     Objective:    Today's Vitals   02/24/21 1113  Weight: 152 lb (68.9 kg)  Height: 5\' 2"  (1.575 m)   Body mass index is 27.8 kg/m.  Advanced Directives 02/24/2021 02/27/2020 11/22/2018 11/22/2018 02/01/2018 04/28/2016 12/09/2015  Does Patient Have a Medical Advance Directive? No No No No No No No  Would patient like information on creating a medical advance directive? - No - Patient declined No - Patient declined No - Patient declined No - Patient declined - No - patient declined information    Current Medications (verified) Outpatient Encounter Medications as of 02/24/2021  Medication Sig   atorvastatin (LIPITOR) 10 MG tablet Take 1 tablet (10 mg total) by mouth daily.   Cholecalciferol (VITAMIN D3 PO) Take 1 tablet by mouth  daily.   ELIQUIS 5 MG TABS tablet TAKE 1 TABLET BY MOUTH TWICE A DAY FOR DVT   Famotidine (PEPCID PO) Take by mouth.   losartan (COZAAR) 25 MG tablet TAKE 1 TABLET BY MOUTH EVERY DAY   vitamin B-12 (CYANOCOBALAMIN) 1000 MCG tablet Take 1,000 mcg by mouth 3 (three) times a week.   No facility-administered encounter medications on file as of 02/24/2021.    Allergies (verified) Dust mite extract and Sulfonamide derivatives   History: Past Medical History:  Diagnosis Date   Cyst 07-2008   Cyst on back x2 that were drained   H/O echocardiogram 02-2000   Hx of colonic polyps    Hyperlipidemia    Hypertension    Varicose veins of both lower extremities    Past Surgical History:  Procedure Laterality Date   POLYPECTOMY     Colon   TONSILLECTOMY     TUBAL LIGATION     Family History  Problem Relation Age of Onset   Hypertension Mother    Parkinsonism Mother    Angina Father    Heart disease Father    Colon cancer Father    Lung cancer Father    Skin cancer Father    Cancer Father        Heart   Breast cancer Sister        over 71   Diabetes Sister    Heart disease Brother 62       triple bypass surgery   Macular degeneration Sister    Diabetes Sister        prediabetic   Hearing loss Sister  hearing problems   Vasculitis Sister    Social History   Socioeconomic History   Marital status: Married    Spouse name: Harrie Jeans   Number of children: 2   Years of education: Not on file   Highest education level: Doctorate  Occupational History   Occupation: retired    Comment: Teaching laboratory technician [name]  Tobacco Use   Smoking status: Never   Smokeless tobacco: Never  Vaping Use   Vaping Use: Never used  Substance and Sexual Activity   Alcohol use: Not Currently    Alcohol/week: 7.0 standard drinks    Types: 7 Glasses of wine per week    Comment: one a day   Drug use: Not Currently   Sexual activity: Not on file  Other Topics Concern   Not on file  Social  History Narrative   Regular exercise-yes   Moved back to [place] from [place] of 2 cat   G3 P2    Exercises walking regu;arly    Retired  Teaching laboratory technician   Drinks one cup of coffee  A day. In addition to walking QD she and her husband take a pilates class on Thursdays. She lives with her husband in a 2 story house, though the master bedroom in on the first level.       Social Determinants of Health   Financial Resource Strain: Low Risk    Difficulty of Paying Living Expenses: Not hard at all  Food Insecurity: No Food Insecurity   Worried About Charity fundraiser in the Last Year: Never true   [place] in the Last Year: Never true  Transportation Needs: No Transportation Needs   Lack of Transportation (Medical): No   Lack of Transportation (Non-Medical): No  Physical Activity: Inactive   Days of Exercise per Week: 0 days   Minutes of Exercise per Session: 0 min  Stress: No Stress Concern Present   Feeling of Stress : Not at all  Social Connections: Moderately Isolated   Frequency of Communication with Friends and Family: Never   Frequency of Social Gatherings with Friends and Family: More than three times a week   Attends Religious Services: Never   Marine scientist or Organizations: No   Attends Music therapist: Never   Marital Status: Married    Tobacco Counseling Counseling given: Not Answered   Clinical Intake:  Pre-visit preparation completed: Yes  Pain : No/denies pain     Nutritional Status: BMI 25 -29 Overweight Nutritional Risks: None Diabetes: No  How often do you need to have someone help you when you read instructions, pamphlets, or other written materials from your doctor or pharmacy?: 1 - Never  Diabetic? no  Interpreter Needed?: No  Information entered by :: NAllen LPN   Activities of Daily Living In your present state of health, do you have any difficulty performing the following activities: 02/24/2021  02/27/2020  Hearing? N N  Vision? N N  Comment not sure unable to say -  Difficulty concentrating or making decisions? Y Y  Comment - seeing a neurology  Walking or climbing stairs? Y Y  Comment - would need assistance  Dressing or bathing? Y Y  Doing errands, shopping? Tempie Donning  Preparing Food and eating ? Y Y  Using the Toilet? Y Y  In the past six months, have you accidently leaked urine? Y N  Do you have problems with loss of bowel control? N N  Managing  your Medications? Y Y  Managing your Finances? Y N  Housekeeping or managing your Housekeeping? Tempie Donning  Some recent data might be hidden    Patient Care Team: Panosh, Standley Brooking, MD as PCP - Windle Guard, MD (Gastroenterology) Sharyne Peach, MD (Ophthalmology) Rolm Bookbinder, MD as Attending Physician (Dermatology) Stefanie Libel, MD as Consulting Physician (Sports Medicine) Marchia Bond, MD as Consulting Physician (Orthopedic Surgery) Gerda Diss, DO as Consulting Physician (Sports Medicine) Lyndee Hensen, PT as Physical Therapist (Physical Therapy) Dohmeier, Asencion Partridge, MD as Consulting Physician (Neurology) Viona Gilmore, [facility] as Pharmacist (Pharmacist)  Indicate any recent Medical Services you may have received from other than [facility] providers in the past year (date may be approximate).     Assessment:   This is a routine wellness examination for Sherrin.  Hearing/Vision screen Vision Screening - Comments:: Regular eye exams, Dr. Delman Cheadle  Dietary issues and exercise activities discussed: Current Exercise Habits: The patient does not participate in regular exercise at present   Goals Addressed             This Visit's Progress    Patient Stated       02/24/2021, none       Depression Screen PHQ 2/9 Scores 02/24/2021 02/27/2020 01/17/2020 12/18/2016 12/09/2015 07/27/2014 07/26/2014  PHQ - 2 Score 0 0 0 0 - 0 0  Exception Documentation - - - - Patient refusal - Other- indicate reason in comment box     Fall Risk Fall Risk  02/24/2021 05/14/2020 02/27/2020 03/27/2019 12/23/2018  Falls in the past year? 1 1 1 1 1   Number falls in past yr: 1 0 1 1 1   Comment - - - - 10 pt husband reports  Injury with Fall? 1 0 0 0 1  Risk Factor Category  - - - - -  Risk for fall due to : Impaired balance/gait;Impaired mobility;History of fall(s);Medication side effect Impaired balance/gait;Impaired mobility Impaired balance/gait;Impaired mobility;Impaired vision;History of fall(s) - History of fall(s)  Follow up Falls evaluation completed;Education provided;Falls prevention discussed Falls evaluation completed Falls prevention discussed - Falls evaluation completed    FALL RISK PREVENTION PERTAINING TO THE HOME:  Any stairs in or around the home? No  If so, are there any without handrails? N/a Home free of loose throw rugs in walkways, pet beds, electrical cords, etc? Yes  Adequate lighting in your home to reduce risk of falls? Yes   ASSISTIVE DEVICES UTILIZED TO PREVENT FALLS:  Life alert? No  Use of a cane, walker or w/c? Yes  Grab bars in the bathroom? Yes  Shower chair or bench in shower? Yes  Elevated toilet seat or a handicapped toilet? Yes   TIMED UP AND GO:  Was the test performed? No .      Cognitive Function: MMSE - Mini Mental State Exam 08/12/2020 10/12/2019 03/27/2019 02/08/2018  Not completed: Unable to complete - - -  Orientation to time - 0 2 1  Orientation to Place - 0 2 5  Registration - 3 3 3   Attention/ Calculation - 0 0 0  Recall - 2 2 1   Language- name 2 objects - 1 2 2   Language- repeat - 1 1 0  Language- follow 3 step command - 2 3 3   Language- read & follow direction - 0 0 1  Write a sentence - 0 0 0  Copy design - 0 0 0  Total score - 9 15 16    Montreal Cognitive Assessment  04/28/2017  Visuospatial/  Executive (0/5) 0  Naming (0/3) 2  Attention: Read list of digits (0/2) 1  Attention: Read list of letters (0/1) 0  Attention: Serial 7 subtraction starting at 100  (0/3) 0  Language: Repeat phrase (0/2) 2  Language : Fluency (0/1) 0  Abstraction (0/2) 2  Delayed Recall (0/5) 0  Orientation (0/6) 6  Total 13      Immunizations Immunization History  Administered Date(s) Administered   Fluad Quad(high Dose 65+) 11/25/2018, 11/29/2019   Influenza Split 12/08/2010, 12/31/2011   Influenza Whole 01/03/2010   Influenza, High Dose Seasonal PF 12/09/2015, 12/18/2016, 01/10/2018, 12/03/2020   Influenza,inj,Quad PF,6+ Mos 11/16/2012, 01/30/2014, 01/04/2015, 12/23/2018   PFIZER(Purple Top)SARS-COV-2 Vaccination 04/13/2019, 05/08/2019, 12/08/2019, 09/24/2020   Pneumococcal Conjugate-13 07/24/2013   Pneumococcal Polysaccharide-23 09/17/2008   Td 02/07/2000, 07/07/2008   Zoster Recombinat (Shingrix) 06/01/2020, 12/03/2020   Zoster, Live 07/22/2011    TDAP status: Due, Education has been provided regarding the importance of this vaccine. Advised may receive this vaccine at local pharmacy or Health Dept. Aware to provide a copy of the vaccination record if obtained from local pharmacy or Health Dept. Verbalized acceptance and understanding.  Flu Vaccine status: Up to date  Pneumococcal vaccine status: Up to date  Covid-19 vaccine status: Completed vaccines  Qualifies for Shingles Vaccine? Yes   Zostavax completed Yes   Shingrix Completed?: Yes  Screening Tests Health Maintenance  Topic Date Due   COVID-19 Vaccine (5 - Booster for Pfizer series) 11/19/2020   TETANUS/TDAP  02/26/2021 (Originally 07/08/2018)   Pneumonia Vaccine 43+ Years old  Completed   INFLUENZA VACCINE  Completed   Zoster Vaccines- Shingrix  Completed   HPV VACCINES  Aged Out   DEXA SCAN  Discontinued   COLONOSCOPY (Pts 45-31yrs Insurance coverage will need to be confirmed)  [place] Maintenance Due  Topic Date Due   COVID-19 Vaccine (5 - Booster for [place] series) 11/19/2020    Colorectal cancer screening: No longer required.    Mammogram status: No longer required due to age.  Bone Density status: N/A  Lung Cancer Screening: (Low Dose CT Chest recommended if Age 74-80 years, 30 pack-year currently smoking OR have quit w/in 15years.) does not qualify.   Lung Cancer Screening Referral: no  Additional Screening:  Hepatitis C Screening: does not qualify;   Vision Screening: Recommended annual ophthalmology exams for early detection of glaucoma and other disorders of the eye. Is the patient up to date with their annual eye exam?  Yes  Who is the provider or what is the name of the office in which the patient attends annual eye exams? Dr. Delman Cheadle If pt is not established with a provider, would they like to be referred to a provider to establish care? No .   Dental Screening: Recommended annual dental exams for proper oral hygiene  Community Resource Referral / Chronic Care Management: CRR required this visit?  No   CCM required this visit?  No      Plan:     I have personally reviewed and noted the following in the patients chart:   Medical and social history Use of alcohol, tobacco or illicit drugs  Current medications and supplements including opioid prescriptions.  Functional ability and status Nutritional status Physical activity Advanced directives List of other physicians Hospitalizations, surgeries, and ER visits in previous 12 months Vitals Screenings to include cognitive, depression, and falls Referrals and appointments  In addition, I have reviewed and discussed with patient certain  preventive protocols, quality metrics, and best practice recommendations. A written personalized care plan for preventive services as well as general preventive health recommendations were provided to patient.     Barb Merino, LPN   12/28/1171   Nurse Notes: Questions answered by spouse. Patient unable to verbalize. 6 CIT not adminisitered.

## 2021-02-24 NOTE — Patient Instructions (Signed)
Pamela Alvarez , Thank you for taking time to come for your Medicare Wellness Visit. I appreciate your ongoing commitment to your health goals. Please review the following plan we discussed and let me know if I can assist you in the future.   Screening recommendations/referrals: Colonoscopy: not required Mammogram: not required Bone Density: not required Recommended yearly ophthalmology/optometry visit for glaucoma screening and checkup Recommended yearly dental visit for hygiene and checkup  Vaccinations: Influenza vaccine: completed 12/03/2020 Pneumococcal vaccine: completed 07/24/2013 Tdap vaccine: due Shingles vaccine: completed   Covid-19: 09/24/2020, 12/08/2019, 05/08/2019, 04/13/2019  Advanced directives: Advance directive discussed with you today. .  Conditions/risks identified: none  Next appointment: Follow up in one year for your annual wellness visit    Preventive Care 65 Years and Older, Female Preventive care refers to lifestyle choices and visits with your health care provider that can promote health and wellness. What does preventive care include? A yearly physical exam. This is also called an annual well check. Dental exams once or twice a year. Routine eye exams. Ask your health care provider how often you should have your eyes checked. Personal lifestyle choices, including: Daily care of your teeth and gums. Regular physical activity. Eating a healthy diet. Avoiding tobacco and drug use. Limiting alcohol use. Practicing safe sex. Taking low-dose aspirin every day. Taking vitamin and mineral supplements as recommended by your health care provider. What happens during an annual well check? The services and screenings done by your health care provider during your annual well check will depend on your age, overall health, lifestyle risk factors, and family history of disease. Counseling  Your health care provider may ask you questions about your: Alcohol use. Tobacco  use. Drug use. Emotional well-being. Home and relationship well-being. Sexual activity. Eating habits. History of falls. Memory and ability to understand (cognition). Work and work Astronomer. Reproductive health. Screening  You may have the following tests or measurements: Height, weight, and BMI. Blood pressure. Lipid and cholesterol levels. These may be checked every 5 years, or more frequently if you are over 56 years old. Skin check. Lung cancer screening. You may have this screening every year starting at age 15 if you have a 30-pack-year history of smoking and currently smoke or have quit within the past 15 years. Fecal occult blood test (FOBT) of the stool. You may have this test every year starting at age 47. Flexible sigmoidoscopy or colonoscopy. You may have a sigmoidoscopy every 5 years or a colonoscopy every 10 years starting at age 30. Hepatitis C blood test. Hepatitis B blood test. Sexually transmitted disease (STD) testing. Diabetes screening. This is done by checking your blood sugar (glucose) after you have not eaten for a while (fasting). You may have this done every 1-3 years. Bone density scan. This is done to screen for osteoporosis. You may have this done starting at age 96. Mammogram. This may be done every 1-2 years. Talk to your health care provider about how often you should have regular mammograms. Talk with your health care provider about your test results, treatment options, and if necessary, the need for more tests. Vaccines  Your health care provider may recommend certain vaccines, such as: Influenza vaccine. This is recommended every year. Tetanus, diphtheria, and acellular pertussis (Tdap, Td) vaccine. You may need a Td booster every 10 years. Zoster vaccine. You may need this after age 68. Pneumococcal 13-valent conjugate (PCV13) vaccine. One dose is recommended after age 20. Pneumococcal polysaccharide (PPSV23) vaccine. One dose is recommended  after age 73. Talk to your health care provider about which screenings and vaccines you need and how often you need them. This information is not intended to replace advice given to you by your health care provider. Make sure you discuss any questions you have with your health care provider. Document Released: 03/22/2015 Document Revised: 11/13/2015 Document Reviewed: 12/25/2014 Elsevier Interactive Patient Education  2017 [place] Prevention in the Home Falls can cause injuries. They can happen to people of all ages. There are many things you can do to make your home safe and to help prevent falls. What can I do on the outside of my home? Regularly fix the edges of walkways and driveways and fix any cracks. Remove anything that might make you trip as you walk through a door, such as a raised step or threshold. Trim any bushes or trees on the path to your home. Use bright outdoor lighting. Clear any walking paths of anything that might make someone trip, such as rocks or tools. Regularly check to see if handrails are loose or broken. Make sure that both sides of any steps have handrails. Any raised decks and porches should have guardrails on the edges. Have any leaves, snow, or ice cleared regularly. Use sand or salt on walking paths during winter. Clean up any spills in your garage right away. This includes oil or grease spills. What can I do in the bathroom? Use night lights. Install grab bars by the toilet and in the tub and shower. Do not use towel bars as grab bars. Use non-skid mats or decals in the tub or shower. If you need to sit down in the shower, use a plastic, non-slip stool. Keep the floor dry. Clean up any water that spills on the floor as soon as it happens. Remove soap buildup in the tub or shower regularly. Attach bath mats securely with double-sided non-slip rug tape. Do not have throw rugs and other things on the floor that can make you trip. What can I do  in the bedroom? Use night lights. Make sure that you have a light by your bed that is easy to reach. Do not use any sheets or blankets that are too big for your bed. They should not hang down onto the floor. Have a firm chair that has side arms. You can use this for support while you get dressed. Do not have throw rugs and other things on the floor that can make you trip. What can I do in the kitchen? Clean up any spills right away. Avoid walking on wet floors. Keep items that you use a lot in easy-to-reach places. If you need to reach something above you, use a strong step stool that has a grab bar. Keep electrical cords out of the way. Do not use floor polish or wax that makes floors slippery. If you must use wax, use non-skid floor wax. Do not have throw rugs and other things on the floor that can make you trip. What can I do with my stairs? Do not leave any items on the stairs. Make sure that there are handrails on both sides of the stairs and use them. Fix handrails that are broken or loose. Make sure that handrails are as long as the stairways. Check any carpeting to make sure that it is firmly attached to the stairs. Fix any carpet that is loose or worn. Avoid having throw rugs at the top or bottom of the stairs. If you do  have throw rugs, attach them to the floor with carpet tape. Make sure that you have a light switch at the top of the stairs and the bottom of the stairs. If you do not have them, ask someone to add them for you. What else can I do to help prevent falls? Wear shoes that: Do not have high heels. Have rubber bottoms. Are comfortable and fit you well. Are closed at the toe. Do not wear sandals. If you use a stepladder: Make sure that it is fully opened. Do not climb a closed stepladder. Make sure that both sides of the stepladder are locked into place. Ask someone to hold it for you, if possible. Clearly mark and make sure that you can see: Any grab bars or  handrails. First and last steps. Where the edge of each step is. Use tools that help you move around (mobility aids) if they are needed. These include: Canes. Walkers. Scooters. Crutches. Turn on the lights when you go into a dark area. Replace any light bulbs as soon as they burn out. Set up your furniture so you have a clear path. Avoid moving your furniture around. If any of your floors are uneven, fix them. If there are any pets around you, be aware of where they are. Review your medicines with your doctor. Some medicines can make you feel dizzy. This can increase your chance of falling. Ask your doctor what other things that you can do to help prevent falls. This information is not intended to replace advice given to you by your health care provider. Make sure you discuss any questions you have with your health care provider. Document Released: 12/20/2008 Document Revised: 08/01/2015 Document Reviewed: 03/30/2014 Elsevier Interactive Patient Education  2017 [employer].

## 2021-02-25 NOTE — Telephone Encounter (Signed)
[facility] My[facility]ie Neither) request verbal order for speech therapy. Frequency: 1x a wk for 6 wks.  Would like a call from the nurse.

## 2021-02-25 NOTE — Telephone Encounter (Signed)
I returned the call to Colp. She will move forward with the needed therapy below.

## 2021-03-09 ENCOUNTER — Emergency Department (HOSPITAL_BASED_OUTPATIENT_CLINIC_OR_DEPARTMENT_OTHER)
Admission: EM | Admit: 2021-03-09 | Discharge: 2021-03-10 | Disposition: A | Discharge status: Home / Self Care | Payer: Medicare PPO | Attending: Emergency Medicine | Admitting: Emergency Medicine

## 2021-03-09 ENCOUNTER — Encounter (HOSPITAL_BASED_OUTPATIENT_CLINIC_OR_DEPARTMENT_OTHER): Payer: Self-pay

## 2021-03-09 DIAGNOSIS — R1084 Generalized abdominal pain: Secondary | ICD-10-CM

## 2021-03-09 DIAGNOSIS — Z7901 Long term (current) use of anticoagulants: Secondary | ICD-10-CM | POA: Diagnosis not present

## 2021-03-09 DIAGNOSIS — Z79899 Other long term (current) drug therapy: Secondary | ICD-10-CM | POA: Insufficient documentation

## 2021-03-09 HISTORY — DX: Unspecified intracranial injury with loss of consciousness status unknown, initial encounter: S06.9XAA

## 2021-03-09 HISTORY — DX: Progressive supranuclear ophthalmoplegia (steele-Richardson-olszewski): G23.1

## 2021-03-09 LAB — LIPASE, BLOOD: Lipase: 53 U/L — ABNORMAL HIGH (ref 11–51)

## 2021-03-09 LAB — COMPREHENSIVE METABOLIC PANEL
ALT: 33 U/L (ref 0–44)
AST: 30 U/L (ref 15–41)
Albumin: 3.8 g/dL (ref 3.5–5.0)
Alkaline Phosphatase: 66 U/L (ref 38–126)
Anion gap: 7 (ref 5–15)
BUN: 21 mg/dL (ref 8–23)
CO2: 26 mmol/L (ref 22–32)
Calcium: 9.5 mg/dL (ref 8.9–10.3)
Chloride: 108 mmol/L (ref 98–111)
Creatinine, Ser: 1.05 mg/dL — ABNORMAL HIGH (ref 0.44–1.00)
GFR, Estimated: 53 mL/min — ABNORMAL LOW (ref >60)
Glucose, Bld: 128 mg/dL — ABNORMAL HIGH (ref 70–99)
Potassium: 3.8 mmol/L (ref 3.5–5.1)
Sodium: 141 mmol/L (ref 135–145)
Total Bilirubin: 0.5 mg/dL (ref 0.3–1.2)
Total Protein: 7.2 g/dL (ref 6.5–8.1)

## 2021-03-09 LAB — CBC
HCT: 41.5 % (ref 36.0–46.0)
Hemoglobin: 13.4 g/dL (ref 12.0–15.0)
MCH: 30.3 pg (ref 26.0–34.0)
MCHC: 32.3 g/dL (ref 30.0–36.0)
MCV: 93.9 fL (ref 80.0–100.0)
Platelets: 275 10*3/uL (ref 150–400)
RBC: 4.42 MIL/uL (ref 3.87–5.11)
RDW: 14.4 % (ref 11.5–15.5)
WBC: 9.4 10*3/uL (ref 4.0–10.5)
nRBC: 0 % (ref 0.0–0.2)

## 2021-03-09 NOTE — ED Triage Notes (Signed)
Pt's hx provided by husband due to limited speech. Husband reports earlier today pt became tearful without known reason and he reports she endorsed abdominal pain. No N/V/D per husband.

## 2021-03-10 ENCOUNTER — Emergency Department (HOSPITAL_BASED_OUTPATIENT_CLINIC_OR_DEPARTMENT_OTHER): Payer: Medicare PPO

## 2021-03-10 DIAGNOSIS — R109 Unspecified abdominal pain: Secondary | ICD-10-CM | POA: Diagnosis not present

## 2021-03-10 LAB — URINALYSIS, ROUTINE W REFLEX MICROSCOPIC
Bilirubin Urine: NEGATIVE
Glucose, UA: NEGATIVE mg/dL
Ketones, ur: 15 mg/dL — AB
Nitrite: NEGATIVE
Specific Gravity, Urine: 1.033 — ABNORMAL HIGH (ref 1.005–1.030)
pH: 5.5 (ref 5.0–8.0)

## 2021-03-10 MED ORDER — IOHEXOL 300 MG/ML  SOLN
100.0000 mL | Freq: Once | INTRAMUSCULAR | Status: AC | PRN
  Administered 2021-03-10: 100 mL via INTRAVENOUS

## 2021-03-10 MED ORDER — MORPHINE SULFATE (PF) 4 MG/ML IV SOLN
4.0000 mg | Freq: Once | INTRAVENOUS | Status: AC
  Administered 2021-03-10: 4 mg via INTRAVENOUS
  Filled 2021-03-10: qty 1

## 2021-03-10 MED ORDER — HYDROCODONE-ACETAMINOPHEN 5-325 MG PO TABS: 2.0000 | ORAL_TABLET | ORAL | 0 refills | Status: DC | PRN

## 2021-03-10 MED ORDER — SODIUM CHLORIDE 0.9 % IV BOLUS
1000.0000 mL | Freq: Once | INTRAVENOUS | Status: AC
  Administered 2021-03-10: 1000 mL via INTRAVENOUS

## 2021-03-10 MED ORDER — ONDANSETRON HCL 4 MG/2ML IJ SOLN
4.0000 mg | Freq: Once | INTRAMUSCULAR | Status: AC
  Administered 2021-03-10: 4 mg via INTRAVENOUS
  Filled 2021-03-10: qty 2

## 2021-03-10 NOTE — Discharge Instructions (Addendum)
Take hydrocodone as prescribed as needed for pain.  Return to the emergency department for worsening pain, high fever, bloody stools, vomiting, or for other new and concerning symptoms.

## 2021-03-10 NOTE — ED Provider Notes (Signed)
[facility]   CSN: [id number] Arrival date & time: 03/09/21  1826     History  Chief Complaint  Patient presents with   Abdominal Pain    Pamela ApleySandra Alvarez is a 81 y.o. female.  Patient is an 81 year old female with past medical history of progressive supranuclear palsy diagnosed 3 years ago, hyperlipidemia.  Patient brought by husband for evaluation of abdominal pain.  She became uncomfortable earlier today, then expressed she was having pain in her abdomen.  According to the husband, she is not having any nausea, vomiting, or diarrhea.  He also reports she has not urinated since 4 PM today.  Patient has minimal verbal ability secondary to PSP, and majority of history taken from the husband who was present at bedside.  The history is provided by the patient.  Abdominal Pain Pain location:  Generalized Pain quality: cramping   Pain radiates to:  Does not radiate Pain severity:  Severe Onset quality:  Sudden Duration:  10 hours Timing:  Constant Progression:  Worsening Chronicity:  New Relieved by:  Nothing Worsened by:  Movement and palpation     Home Medications Prior to Admission medications   Medication Sig Start Date End Date Taking? Authorizing Provider  atorvastatin (LIPITOR) 10 MG tablet Take 1 tablet (10 mg total) by mouth daily. 02/07/21   Panosh, Neta MendsWanda K, MD  Cholecalciferol (VITAMIN D3 PO) Take 1 tablet by mouth daily.    [provider]  ELIQUIS 5 MG TABS tablet TAKE 1 TABLET BY MOUTH TWICE A DAY FOR DVT 02/17/21   Nelwyn SalisburyFry, Stephen A, MD  Famotidine (PEPCID PO) Take by mouth.    [provider]  losartan (COZAAR) 25 MG tablet TAKE 1 TABLET BY MOUTH EVERY DAY 01/06/21   Panosh, Neta MendsWanda K, MD  vitamin B-12 (CYANOCOBALAMIN) 1000 MCG tablet Take 1,000 mcg by mouth 3 (three) times a week.    [provider]      Allergies    Dust mite extract and Sulfonamide derivatives    Review of Systems   Review of  Systems  Gastrointestinal:  Positive for abdominal pain.   Physical Exam Updated Vital Signs BP (!) 172/69    Pulse 95    Temp 97.7 F (36.5 C)    Resp 18    SpO2 97%  Physical Exam Vitals and nursing note reviewed.  Constitutional:      General: She is not in acute distress.    Appearance: She is well-developed. She is not diaphoretic.  HENT:     Head: Normocephalic and atraumatic.  Cardiovascular:     Rate and Rhythm: Normal rate and regular rhythm.     Heart sounds: No murmur heard.   No friction rub. No gallop.  Pulmonary:     Effort: Pulmonary effort is normal. No respiratory distress.     Breath sounds: Normal breath sounds. No wheezing.  Abdominal:     General: Bowel sounds are normal. There is no distension.     Palpations: Abdomen is soft.     Tenderness: There is abdominal tenderness in the right lower quadrant, periumbilical area, suprapubic area and left lower quadrant. There is no right CVA tenderness, left CVA tenderness, guarding or rebound.  Musculoskeletal:        General: Normal range of motion.     Cervical back: Normal range of motion and neck supple.  Skin:    General: Skin is warm and dry.  Neurological:     General: No  focal deficit present.     Mental Status: She is alert and oriented to person, place, and time.    ED Results / Procedures / Treatments   Labs (all labs ordered are listed, but only abnormal results are displayed) Labs Reviewed  LIPASE, BLOOD - Abnormal; Notable for the following components:      Result Value   Lipase 53 (*)    All other components within normal limits  COMPREHENSIVE METABOLIC PANEL - Abnormal; Notable for the following components:   Glucose, Bld 128 (*)    Creatinine, Ser 1.05 (*)    GFR, Estimated 53 (*)    All other components within normal limits  CBC  URINALYSIS, ROUTINE W REFLEX MICROSCOPIC    EKG None  Radiology No results found.  Procedures Procedures    Medications Ordered in ED Medications   morphine 4 MG/ML injection 4 mg (has no administration in time range)  ondansetron (ZOFRAN) injection 4 mg (has no administration in time range)  sodium chloride 0.9 % bolus 1,000 mL (has no administration in time range)    ED Course/ Medical Decision Making/ A&P  This patient presents to the ED for concern of head throbbing, this involves an extensive number of treatment options, and is a complaint that carries with it a high risk of complications and morbidity.  The differential diagnosis includes appendicitis, diverticulitis, urinary tract infection, small bowel obstruction  Co morbidities that complicate the patient evaluation  None   Additional history obtained:  Additional history obtained from husband at bedside External records from outside source obtained and reviewed including N/A   Lab Tests:  Laboratory studies were obtained including complete blood count, comprehensive metabolic panel, and lipase.  There was a slight elevation of lipase, however the other laboratory studies were unremarkable.  Urinalysis also obtained not suggestive of infection   Imaging Studies ordered:  I ordered imaging studies including CT scan of the abdomen and pelvis I independently visualized and interpreted imaging which showed no acute process.   I agree with the radiologist interpretation   Cardiac Monitoring:  No cardiac monitoring   Medicines ordered and prescription drug management:  Patient received morphine and Zofran for pain and nausea   Test Considered:  No other tests considered or indicated   Critical Interventions:  None   Consultations Obtained:  No consultations required or indicated   Problem List / ED Course:  Patient presenting here with complaints of abdominal pain as described in the HPI.  Work-up initiated including laboratory studies, urinalysis, and CT scan of the abdomen and pelvis.  There was no acute process identified.  She did have  cholelithiasis, but no cholecystitis.  At this point, patient feeling better after receiving pain medication and seems appropriate for discharge.  Husband at bedside is comfortable with the disposition.  She will be given medicine for pain and is to return as needed if symptoms worsen or change.   Reevaluation:  After the interventions noted above, I reevaluated the patient and found that they have :improved   Social Determinants of Health:  She has history of supra nuclear palsy and does not provide much history.  History taken primarily from her husband who was present at bedside   Dispostion:  After consideration of the diagnostic results and the patients response to treatment, I feel that the patent would benefit from outpatient follow-up/return as needed.  She will be given medication for pain..    Final Clinical Impression(s) / ED Diagnoses Final diagnoses:  None    Rx / DC Orders ED Discharge Orders     None         Geoffery Lyons, MD 03/10/21 [id number]

## 2021-03-11 ENCOUNTER — Encounter: Payer: Self-pay | Admitting: Internal Medicine

## 2021-03-11 ENCOUNTER — Ambulatory Visit: Payer: Medicare PPO | Admitting: Internal Medicine

## 2021-03-11 VITALS — BP 136/68 | HR 86 | Temp 98.3°F

## 2021-03-11 DIAGNOSIS — R109 Unspecified abdominal pain: Secondary | ICD-10-CM

## 2021-03-11 DIAGNOSIS — K8020 Calculus of gallbladder without cholecystitis without obstruction: Secondary | ICD-10-CM

## 2021-03-11 DIAGNOSIS — Z7901 Long term (current) use of anticoagulants: Secondary | ICD-10-CM | POA: Diagnosis not present

## 2021-03-11 DIAGNOSIS — M25472 Effusion, left ankle: Secondary | ICD-10-CM | POA: Diagnosis not present

## 2021-03-11 DIAGNOSIS — Z79899 Other long term (current) drug therapy: Secondary | ICD-10-CM | POA: Diagnosis not present

## 2021-03-11 DIAGNOSIS — G231 Progressive supranuclear ophthalmoplegia [Steele-Richardson-Olszewski]: Secondary | ICD-10-CM

## 2021-03-11 DIAGNOSIS — Z86718 Personal history of other venous thrombosis and embolism: Secondary | ICD-10-CM | POA: Diagnosis not present

## 2021-03-11 NOTE — Patient Instructions (Addendum)
Not sure why had stomach pain . Will do  updated  ,   vascular test on the left  .   To check for on going obstruction.  Doubt  ankle injury since she uses the ankle without a problem.   Notice if pain    recurrent .   Abd  still could be gall bladder related .

## 2021-03-11 NOTE — Progress Notes (Signed)
Chief Complaint  Patient presents with   Joint Swelling    Left ankle, for about a week. Was broken 3 yrs ago. Hard to keep elevated, any other options to reduce swelling. Has not taken anything since swelling.     HPI: Pamela Alvarez 81 y.o. here with spouse and caretaker Kalman Shan come in for   was seen in ed Jan 1 for abd pain  ct labs  has gs but not felt to be  inflammed  ua no obv uti  Had severe abdominal pain waited 5 hours in the ED was given morphine and pain improved had work-up see evaluation no recurrence.  Had no vomiting husband states that she tolerates pain quite well so was concerning.  The appointment was originally May because of increasing swelling in the left ankle area this is the ankle where she had a significant injury fracture but also the leg that she had a DVT.  Calf is not increased in size she maintains on anticoagulation does not seem to bother her as far as pain when they get her up to walk and move around she does not seem to favor that foot leg.  (Of note she did not seem to favor when she had her DVT either.) ROS: See pertinent positives and negatives per HPI.  No fever breathing problems vomiting shortness of breath obvious.  Past Medical History:  Diagnosis Date   Cyst 07/2008   Cyst on back x2 that were drained   H/O echocardiogram 02/2000   Hx of colonic polyps    Hyperlipidemia    Hypertension    PSP (progressive supranuclear palsy) (HCC)    TBI (traumatic brain injury)    Varicose veins of both lower extremities     Family History  Problem Relation Age of Onset   Hypertension Mother    Parkinsonism Mother    Angina Father    Heart disease Father    Colon cancer Father    Lung cancer Father    Skin cancer Father    Cancer Father        Heart   Breast cancer Sister        over 38   Diabetes Sister    Heart disease Brother 57       triple bypass surgery   Macular degeneration Sister    Diabetes Sister        prediabetic   Hearing loss  Sister        hearing problems   Vasculitis Sister     Social History   Socioeconomic History   Marital status: Married    Spouse name: Psychologist, prison and probation services   Number of children: 2   Years of education: Not on file   Highest education level: Doctorate  Occupational History   Occupation: retired    Comment: Teaching laboratory technician [name]  Tobacco Use   Smoking status: Never   Smokeless tobacco: Never  Scientific laboratory technician Use: Never used  Substance and Sexual Activity   Alcohol use: Not Currently    Alcohol/week: 7.0 standard drinks    Types: 7 Glasses of wine per week    Comment: one a day   Drug use: Not Currently   Sexual activity: Not on file  Other Topics Concern   Not on file  Social History Narrative   Regular exercise-yes   Moved back to [place] from [place] of 2 cat   G3 P2    Exercises walking regu;arly  Retired  Teaching laboratory technician   Drinks one cup of coffee  A day. In addition to walking QD she and her husband take a pilates class on Thursdays. She lives with her husband in a 2 story house, though the master bedroom in on the first level.       Social Determinants of Health   Financial Resource Strain: Low Risk    Difficulty of Paying Living Expenses: Not hard at all  Food Insecurity: No Food Insecurity   Worried About Charity fundraiser in the Last Year: Never true   [place] in the Last Year: Never true  Transportation Needs: No Transportation Needs   Lack of Transportation (Medical): No   Lack of Transportation (Non-Medical): No  Physical Activity: Inactive   Days of Exercise per Week: 0 days   Minutes of Exercise per Session: 0 min  Stress: No Stress Concern Present   Feeling of Stress : Not at all  Social Connections: Not on file    Outpatient Medications Prior to Visit  Medication Sig Dispense Refill   atorvastatin (LIPITOR) 10 MG tablet Take 1 tablet (10 mg total) by mouth daily. 90 tablet 2   Cholecalciferol (VITAMIN D3 PO) Take 1 tablet by mouth  daily.     ELIQUIS 5 MG TABS tablet TAKE 1 TABLET BY MOUTH TWICE A DAY FOR DVT 60 tablet 0   Famotidine (PEPCID PO) Take by mouth.     losartan (COZAAR) 25 MG tablet TAKE 1 TABLET BY MOUTH EVERY DAY 90 tablet 0   vitamin B-12 (CYANOCOBALAMIN) 1000 MCG tablet Take 1,000 mcg by mouth 3 (three) times a week.     HYDROcodone-acetaminophen (NORCO) 5-325 MG tablet Take 2 tablets by mouth every 4 (four) hours as needed. (Patient not taking: Reported on 03/11/2021) 10 tablet 0   No facility-administered medications prior to visit.     EXAM:  BP 136/68 (BP Location: Right Arm, Patient Position: Sitting, Cuff Size: Normal)    Pulse 86    Temp 98.3 F (36.8 C) (Oral)    SpO2 96%   There is no height or weight on file to calculate BMI.  GENERAL: vitals reviewed and listed above, alert, minimally verbal responding with facial expressions appears well hydrated and in no acute distress in wheelchair HEENT: atraumatic, conjunctiva  clear, no obvious abnormalities on inspection of external nose and ears OP : Masked NECK: no obvious masses on inspection palpation  LUNGS: clear to auscultation bilaterally, no wheezes, rales or rhonchi, not taking deep breaths on command but appears to have good air movement and no wheezing CV: HRRR, no clubbing cyanosis  MS: moves all extremities left lower extremity without redness or warmth plus 2+ edema around the left ankle but no bony tenderness is noted.  There is no streaking.  Pulses intact.  Normal cap refill.  Lab Results  Component Value Date   WBC 9.4 03/09/2021   HGB 13.4 03/09/2021   HCT 41.5 03/09/2021   PLT 275 03/09/2021   GLUCOSE 128 (H) 03/09/2021   CHOL 222 (H) 12/25/2020   TRIG 85.0 12/25/2020   HDL 75.60 12/25/2020   LDLDIRECT 191.8 07/22/2012   LDLCALC 129 (H) 12/25/2020   ALT 33 03/09/2021   AST 30 03/09/2021   NA 141 03/09/2021   K 3.8 03/09/2021   CL 108 03/09/2021   CREATININE 1.05 (H) 03/09/2021   BUN 21 03/09/2021   CO2 26  03/09/2021   TSH 3.27 12/25/2020   HGBA1C 5.3  12/25/2020   BP Readings from Last 3 Encounters:  03/11/21 136/68  03/10/21 120/60  02/12/21 (!) 147/82  Ed record review labs and urine and abd CT no evidence of UTI.  ASSESSMENT AND PLAN:  Discussed the following assessment and plan:  Left ankle swelling - hx of old fracture and dvt  - Plan: VAS [place] LOWER EXTREMITY VENOUS (DVT)  Chronic anticoagulation  Personal history of DVT (deep vein thrombosis) - Plan: VAS [place] LOWER EXTREMITY VENOUS (DVT)  Medication management  Gallstones  Progressive supranuclear palsy (HCC)  Abdominal pain, unspecified abdominal location - Episode ED evaluation borderline amylase mostly unremarkable evaluation responded to 1 dose of morphine Check venous doppler  r/o on going dvt   Suspect abd  pain episode relieved by morhpine x 1  still consider  gall stones  follow and monitor  In regard to left ankle decided to delay getting ankle x-ray because of no pain tenderness change in gait. Will recheck vascular ultrasound has been over a year consider chronic DVT Continue on anticoagulation discussed compression stockings assistant will help train spouse on putting them on for the day. Follow Try compression stocking daily.  -Patient advised to return or notify health care team  if  new concerns arise.  Patient Instructions  Not sure why had stomach pain . Will do  updated  ,   vascular test on the left  .   To check for on going obstruction.  Doubt  ankle injury since she uses the ankle without a problem.   Notice if pain    recurrent .   Abd  still could be gall bladder related .      Standley Brooking. Calea Hribar M.D.

## 2021-03-12 ENCOUNTER — Encounter: Payer: Self-pay | Admitting: Neurology

## 2021-03-12 ENCOUNTER — Telehealth: Payer: Self-pay | Admitting: Pharmacist

## 2021-03-12 NOTE — Chronic Care Management (AMB) (Signed)
Chronic Care Management Pharmacy Assistant   Name: Pamela Alvarez  MRN: [id number] DOB: 10-29-1940  Pamela Alvarez is an 81 y.o. year old female who presents for his initial CCM visit with the clinical pharmacist.  Reason for Encounter: Chart prep for initial visit with Gaylord Shih the clinical pharmacist on 03/17/2021 10:30 via the phone.   Conditions to be addressed/monitored: HLD and varicose veins, atypical parkinsonism, arthritis, vitamin D deficiency, elevated BP and cognitive and neurobehavioral dysfunction.   Recent office visits:  03/11/2021 Pamela Alvarez - Patient was seen for left ankle swelling and additional issues. No medication changes. No follow up noted.   12/25/2021 Pamela Alvarez - Patient was seen for preventive health examination and additional issues. No medication changes. Follow up in 6 months.  Recent consult visits:  02/12/2022 Pamela Alvarez (cardiology) - Patient was seen for progressive supranuclear palsy and an additional issue. No medication changes. No follow up noted.  Hospital visits: Patient was seen at [name] ED on 03/09/2021 due to generalized abdominal pain. Discharge date was 03/10/2021 after 5 hours in ED.    New?Medications Started at [facility] Discharge:?? - Hydrocodone Acetaminophen 5/325 mg 2 tablets every 4 hours as needed.  Medication Changes at Hospital Discharge: - No medication changes made.  Medications Discontinued at Hospital Discharge: - No medications stopped.  Medications that remain the same after Hospital Discharge:??  -All other medications will remain the same.    Medications: Outpatient Encounter Medications as of 03/12/2021  Medication Sig   atorvastatin (LIPITOR) 10 MG tablet Take 1 tablet (10 mg total) by mouth daily.   Cholecalciferol (VITAMIN D3 PO) Take 1 tablet by mouth daily.   ELIQUIS 5 MG TABS tablet TAKE 1 TABLET BY MOUTH TWICE A DAY FOR DVT   Famotidine (PEPCID PO) Take by mouth.    HYDROcodone-acetaminophen (NORCO) 5-325 MG tablet Take 2 tablets by mouth every 4 (four) hours as needed. (Patient not taking: Reported on 03/11/2021)   losartan (COZAAR) 25 MG tablet TAKE 1 TABLET BY MOUTH EVERY DAY   vitamin B-12 (CYANOCOBALAMIN) 1000 MCG tablet Take 1,000 mcg by mouth 3 (three) times a week.   No facility-administered encounter medications on file as of 03/12/2021.  Fill History: ATORVASTATIN 10 MG TABLET 02/07/2021 90   LOSARTAN POTASSIUM 25 MG TAB 01/06/2021 90   TRIAMCINOLONE 0.025% CREAM 02/06/2021 10   ELIQUIS 5 MG TABLET 02/17/2021 30   Have you seen any other providers since your last visit? **no, patient saw Dr. Fabian Sharp yesterday (03/11/2021)  Any changes in your medications or health?  Patients husband states no changes.  Any side effects from any medications? Patients husband states no known side effects.  Do you have an symptoms or problems not managed by your medications? Patients husband states nothing that has not been discussed with Dr. Fabian Sharp  Any concerns about your health right now? Patients husband states no new concerns.  Has your provider asked that you check blood pressure, blood sugar, or follow special diet at home? No, patients husband does check blood pressure from time to time.  Do you get any type of exercise on a regular basis? Patient is unable to get regular exercise.  Can you think of a goal you would like to reach for your health? Patients husband states he would like to keep her at home as long as possible.   Do you have any problems getting your medications? No  Is there anything that you would like to discuss  during the appointment? Patients husband states nothing new.  Please bring medications and supplements to appointment  Note: Patients husband states patient is unable to communicate.   Care Gaps: AWV - message sent to [name] Last BP - 136/68 on 03/11/2021 TDAP - overdue Covid vaccine - overdue  Star Rating  Drugs: Atorvastatin 10 mg - last filled 02/07/2021 90 DS at [facility] Losartan 25 mg  - last filled 01/06/2021 90 [facility] at [facility]  [facility]  Clinical Pharmacist Assistant [phone]

## 2021-03-13 ENCOUNTER — Other Ambulatory Visit: Payer: Self-pay | Admitting: Neurology

## 2021-03-13 DIAGNOSIS — G20 Parkinson's disease: Secondary | ICD-10-CM

## 2021-03-13 DIAGNOSIS — R4189 Other symptoms and signs involving cognitive functions and awareness: Secondary | ICD-10-CM

## 2021-03-13 DIAGNOSIS — F09 Unspecified mental disorder due to known physiological condition: Secondary | ICD-10-CM

## 2021-03-13 DIAGNOSIS — R262 Difficulty in walking, not elsewhere classified: Secondary | ICD-10-CM

## 2021-03-13 DIAGNOSIS — G231 Progressive supranuclear ophthalmoplegia [Steele-Richardson-Olszewski]: Secondary | ICD-10-CM

## 2021-03-13 DIAGNOSIS — R269 Unspecified abnormalities of gait and mobility: Secondary | ICD-10-CM

## 2021-03-14 ENCOUNTER — Telehealth: Payer: Self-pay

## 2021-03-14 ENCOUNTER — Other Ambulatory Visit: Payer: Self-pay

## 2021-03-14 ENCOUNTER — Ambulatory Visit (HOSPITAL_COMMUNITY)
Admission: RE | Admit: 2021-03-14 | Discharge: 2021-03-14 | Disposition: A | Discharge status: Home / Self Care | Payer: Medicare PPO | Source: Ambulatory Visit | Attending: Internal Medicine | Admitting: Internal Medicine

## 2021-03-14 DIAGNOSIS — M25472 Effusion, left ankle: Secondary | ICD-10-CM | POA: Diagnosis not present

## 2021-03-14 DIAGNOSIS — Z86718 Personal history of other venous thrombosis and embolism: Secondary | ICD-10-CM | POA: Diagnosis not present

## 2021-03-14 NOTE — Telephone Encounter (Signed)
DVT positive in left leg.  PCP not in office, will send to another PCP.

## 2021-03-14 NOTE — Telephone Encounter (Signed)
The preliminary report describes this as a "chronic" DVT with no acute thrombi. These are likely to be remnants from the DVT she had in October 2021. She is already taking Eliquis 5 mg BID, so she should continue to take this. No further action is needed at this time. She should follow up with Dr. Fabian Sharp next week.

## 2021-03-14 NOTE — Telephone Encounter (Signed)
Husband notified of Dr Clent Ridges response & that PCP is not in office. F/u appt made for 1/10.

## 2021-03-16 NOTE — Progress Notes (Signed)
Chronic Care Management Pharmacy Note  03/17/2021 Name:  Pamela Alvarez MRN:  [id number] DOB:  [birth year]  Summary: BP not at goal < 140/90 LDL not at goal < 100  Recommendations/Changes made from today's visit: -Recommend increasing atorvastatin to 40 mg daily to lower LDL to < 100 -Consider switching Eliquis to Xarelto to minimize pill burden -Recommend repeat vitamin B12 and vitamin D levels as these are overdue  Plan: Follow up BP assessment in 2 months   Subjective: Pamela Alvarez is an 81 y.o. year old female who is a primary patient of Panosh, Standley Brooking, MD.  The CCM team was consulted for assistance with disease management and care coordination needs.    Engaged with patient by telephone for initial visit in response to provider referral for pharmacy case management and/or care coordination services.   Consent to Services:  The patient was given the following information about Chronic Care Management services today, agreed to services, and gave verbal consent: 1. CCM service includes personalized support from designated clinical staff supervised by the primary care provider, including individualized plan of care and coordination with other care providers 2. 24/7 contact phone numbers for assistance for urgent and routine care needs. 3. Service will only be billed when office clinical staff spend 20 minutes or more in a month to coordinate care. 4. Only one practitioner may furnish and bill the service in a calendar month. 5.The patient may stop CCM services at any time (effective at the end of the month) by phone call to the office staff. 6. The patient will be responsible for cost sharing (co-pay) of up to 20% of the service fee (after annual deductible is met). Patient agreed to services and consent obtained.  Patient Care Team: Panosh, Standley Brooking, MD as PCP - Windle Guard, MD (Gastroenterology) Sharyne Peach, MD (Ophthalmology) Rolm Bookbinder, MD as Attending Physician  (Dermatology) Stefanie Libel, MD as Consulting Physician (Sports Medicine) Marchia Bond, MD as Consulting Physician (Orthopedic Surgery) Gerda Diss, DO as Consulting Physician (Sports Medicine) Lyndee Hensen, PT as Physical Therapist (Physical Therapy) Dohmeier, Asencion Partridge, MD as Consulting Physician (Neurology) Viona Gilmore, [facility] as Pharmacist (Pharmacist)  Recent office visits: 03/11/2021 [name] MD - Patient was seen for left ankle swelling and additional issues. No medication changes. No follow up noted.  02/24/21 Glenna Durand, LPN: Patient presented for AWV.   12/25/2020 [name] MD - Patient was seen for preventive health examination and additional issues. No medication changes. Follow up in 6 months.  Recent consult visits: 02/12/2021 Larey Seat MD (neurology) - Patient was seen for progressive supranuclear palsy and an additional issue. No medication changes. No follow up noted.  Hospital visits: Patient was seen at [facility] ED on 03/09/2021 due to generalized abdominal pain. Discharge date was 03/10/2021 after 5 hours in ED.     New?Medications Started at [facility] Discharge:?? - Hydrocodone Acetaminophen 5/325 mg 2 tablets every 4 hours as needed.   Medication Changes at Hospital Discharge: - No medication changes made.   Medications Discontinued at Hospital Discharge: - No medications stopped.   Medications that remain the same after Hospital Discharge:??  -All other medications will remain the same.       Objective:  Lab Results  Component Value Date   CREATININE 1.05 (H) 03/09/2021   BUN 21 03/09/2021   GFR 45.78 (L) 12/25/2020   GFRNONAA 53 (L) 03/09/2021   GFRAA 53 (L) 12/22/2019   NA 141 03/09/2021   K 3.8 03/09/2021  CALCIUM 9.5 03/09/2021   CO2 26 03/09/2021   GLUCOSE 128 (H) 03/09/2021    Lab Results  Component Value Date/Time   HGBA1C 5.3 12/25/2020 12:00 PM   HGBA1C 5.7 12/23/2018 10:17 AM   GFR 45.78 (L)  12/25/2020 12:00 PM   GFR 54.74 (L) 12/23/2018 10:17 AM    Last diabetic Eye exam: No results found for: HMDIABEYEEXA  Last diabetic Foot exam: No results found for: HMDIABFOOTEX   Lab Results  Component Value Date   CHOL 222 (H) 12/25/2020   HDL 75.60 12/25/2020   LDLCALC 129 (H) 12/25/2020   LDLDIRECT 191.8 07/22/2012   TRIG 85.0 12/25/2020   CHOLHDL 3 12/25/2020    Hepatic Function Latest Ref Rng & Units 03/09/2021 12/25/2020 12/22/2019  Total Protein 6.5 - 8.1 g/dL 7.2 7.1 6.9  Albumin 3.5 - 5.0 g/dL 3.8 3.9 -  AST 15 - 41 U/L '30 25 18  ' ALT 0 - 44 U/L 33 22 9  Alk Phosphatase 38 - 126 U/L 66 76 -  Total Bilirubin 0.3 - 1.2 mg/dL 0.5 0.7 0.4  Bilirubin, Direct 0.0 - 0.3 mg/dL - 0.1 0.1    Lab Results  Component Value Date/Time   TSH 3.27 12/25/2020 12:00 PM   TSH 2.10 12/22/2019 04:10 PM   FREET4 0.91 04/22/2017 10:13 AM    CBC Latest Ref Rng & Units 03/09/2021 12/25/2020 12/22/2019  WBC 4.0 - 10.5 K/uL 9.4 8.5 7.6  Hemoglobin 12.0 - 15.0 g/dL 13.4 14.2 14.3  Hematocrit 36.0 - 46.0 % 41.5 43.0 42.8  Platelets 150 - 400 K/uL 275 236.0 300    Lab Results  Component Value Date/Time   VD25OH 15.10 (L) 12/23/2018 10:17 AM   VD25OH 19 (L) 09/18/2008 06:57 AM    Clinical ASCVD: No  The ASCVD Risk score (Arnett DK, et al., 2019) failed to calculate for the following reasons:   The 2019 ASCVD risk score is only valid for ages 81 to 70    Depression screen PHQ 2/9 02/24/2021 02/27/2020 01/17/2020  Decreased Interest 0 0 0  Down, Depressed, Hopeless 0 0 0  PHQ - 2 Score 0 0 0  Some recent data might be hidden     Social History   Tobacco Use  Smoking Status Never  Smokeless Tobacco Never   BP Readings from Last 3 Encounters:  03/11/21 136/68  03/10/21 120/60  02/12/21 (!) 147/82   Pulse Readings from Last 3 Encounters:  03/11/21 86  03/10/21 94  02/12/21 94   Wt Readings from Last 3 Encounters:  02/24/21 152 lb (68.9 kg)  02/12/21 154 lb 8 oz (70.1 kg)   12/25/20 155 lb 3.2 oz (70.4 kg)   BMI Readings from Last 3 Encounters:  02/24/21 27.80 kg/m  02/12/21 28.26 kg/m  12/25/20 28.39 kg/m    Assessment/Interventions: Review of patient past medical history, allergies, medications, health status, including review of consultants reports, laboratory and other test data, was performed as part of comprehensive evaluation and provision of chronic care management services.   SDOH:  (Social Determinants of Health) assessments and interventions performed: Yes SDOH Interventions    Flowsheet Row Most Recent Value  SDOH Interventions   Financial Strain Interventions Intervention Not Indicated  Transportation Interventions Intervention Not Indicated      SDOH Screenings   Alcohol Screen: Not on file  Depression (PHQ2-9): Low Risk    PHQ-2 Score: 0  Financial Resource Strain: Low Risk    Difficulty of Paying Living Expenses: Not hard at all  Food Insecurity: No Food Insecurity   Worried About Charity fundraiser in the Last Year: Never true   Arboriculturist in the Last Year: Never true  Housing: Not on file  Physical Activity: Inactive   Days of Exercise per Week: 0 days   Minutes of Exercise per Session: 0 min  Social Connections: Not on file  Stress: No Stress Concern Present   Feeling of Stress : Not at all  Tobacco Use: Low Risk    Smoking Tobacco Use: Never   Smokeless Tobacco Use: Never   Passive Exposure: Not on file  Transportation Needs: No Transportation Needs   Lack of Transportation (Medical): No   Lack of Transportation (Non-Medical): No   Spoke with patient's husband for the visit as the patient is non-verbal and he takes care of her.  Patient fights taking the medications but  she can swallow them and take them. She didn't go to the doctor for 20 years and doesn't like pills either. Patient was into holistic care and didn't want to take medications. Patient's husband isn't sure she would even take them if he did  crush them. She is usually good about taking her prescribed medications but does miss the vitamins sometimes because he doesn't fight her on those.   Patient doesn't do too much right now. They usually get up between 5:30-6:30 AM and she snoozes some afterwards. She gets dressed at 8 am. She has a caregiver come from 9am-5pm and has 42 years of experience and is very helpful. She spends most of the day watching TV and sleeping and cannot read with her eyesight. She has a standing hair appointment and just scheduled PT/OT and speech therapy which will be twice a week for a few months.  On nicer weather days, they try to go for a walk in the wheelchair. Sometimes she sits in the car when they are in the store and doesn't go inside. Patient's husband reports that most people in her situation are in a nursing home because she requires so much care. She did have a fall 9 months ago and the caregiver has helped with him learning how to pick her up from that.  Patient usually drinks primarily water and iced tea, and the occasional root beer. For lunch, she usually eats a sandwich or soup with lunch. For dinner, she has cheese and crackers with a meat and vegetable. Sometimes they have a salad or fruit with dinner. They usually always eat at home and maybe eat out once a week.   Patient's husband reports that her cost of medications isn't too bad. Patient's husband sets aside a pillbox for her medications and has an AM/PM for her to take Eliquis.  CCM Care Plan  Allergies  Allergen Reactions   Dust Mite Extract Cough   Sulfonamide Derivatives     Medications Reviewed Today     Reviewed by Viona Gilmore, [facility] (Pharmacist) on 03/17/21 at 1100  Med List Status: <None>   Medication Order Taking? Sig Documenting Provider Last Dose Status Informant  atorvastatin (LIPITOR) 10 MG tablet [id number] Yes Take 1 tablet (10 mg total) by mouth daily. Panosh, Standley Brooking, MD Taking Active   Cholecalciferol (VITAMIN  D3 PO) [id number] Yes Take 1 tablet by mouth daily. [provider] Taking Active   ELIQUIS 5 MG TABS tablet [id number] Yes TAKE 1 TABLET BY MOUTH TWICE A DAY FOR DVT Laurey Morale, MD Taking Active   Famotidine (PEPCID PO) [id number]  Yes Take 10 mg by mouth at bedtime. [provider] Taking Active   HYDROcodone-acetaminophen (NORCO) 5-325 MG tablet [id number] Yes Take 2 tablets by mouth every 4 (four) hours as needed. Veryl Speak, MD Taking Active   losartan (COZAAR) 25 MG tablet [id number] Yes TAKE 1 TABLET BY MOUTH EVERY DAY Panosh, Standley Brooking, MD Taking Active   vitamin B-12 (CYANOCOBALAMIN) 500 MCG tablet [id number] Yes Take 500 mcg by mouth daily. [provider] Taking Active             Patient Active Problem List   Diagnosis Date Noted   PSP (progressive supranuclear palsy) ([place]) 02/12/2021   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[place]) 02/12/2021   Unable to ambulate 02/12/2020   Progressive supranuclear palsy ([place]) 10/12/2019   Encephalomalacia on imaging study 10/12/2019   Fluency disorder associated with underlying disease 02/08/2018   Cognitive and neurobehavioral dysfunction 02/08/2018   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[place]) 02/08/2018   Arthritis of carpometacarpal (CMC) joint of left thumb 01/24/2018   Ankle fracture, left 09/03/2016   Hand dysfunction 03/11/2016   Symptomatic varicose veins, bilateral 03/11/2016   Numbness of left thumb 03/11/2016   Elevated BP 07/27/2014   Bunion of left foot 07/26/2014   Abnormality of gait 07/26/2014   Visit for preventive health examination 07/28/2013   Right anterior knee pain 07/22/2012   Medicare annual wellness visit, subsequent 07/25/2011   ANXIETY, SITUATIONAL 09/25/2009   ARTHRITIS 09/25/2009   Vitamin D deficiency 09/27/2008   Hyperlipidemia 09/17/2008   Elevated blood pressure reading without diagnosis of hypertension 09/17/2008   COLONIC POLYPS, HX OF 09/17/2008   PERSONAL HISTORY DISEASES SKIN&SUBCUT TISSUE  09/17/2008    Immunization History  Administered Date(s) Administered   Fluad Quad(high Dose 65+) 11/25/2018, 11/29/2019   Influenza Split 12/08/2010, 12/31/2011   Influenza Whole 01/03/2010   Influenza, High Dose Seasonal PF 12/09/2015, 12/18/2016, 01/10/2018, 12/03/2020   Influenza,inj,Quad PF,6+ Mos 11/16/2012, 01/30/2014, 01/04/2015, 12/23/2018   PFIZER(Purple Top)SARS-COV-2 Vaccination 04/13/2019, 05/08/2019, 12/08/2019, 09/24/2020   Pneumococcal Conjugate-13 07/24/2013   Pneumococcal Polysaccharide-23 09/17/2008   Td 02/07/2000, 07/07/2008   Zoster Recombinat (Shingrix) 06/01/2020, 12/03/2020   Zoster, Live 07/22/2011    Conditions to be addressed/monitored:  Hypertension, Hyperlipidemia, GERD, and Vitamin D deficiency and history of DVT  Care Plan : [place]  Updates made by Viona Gilmore, [place] since 03/17/2021 12:00 AM     Problem: Problem: Hypertension, Hyperlipidemia, GERD, and Vitamin D deficiency and history of DVT      Long-Range Goal: Patient-Specific Goal   Start Date: 03/17/2021  Expected End Date: 03/17/2022  This Visit's Progress: On track  Priority: High  Note:   Current Barriers:  Unable to self administer medications as prescribed  Pharmacist Clinical Goal(s):  Patient will achieve adherence to monitoring guidelines and medication adherence to achieve therapeutic efficacy achieve control of cholesterol as evidenced by lipid panel maintain control of blood pressure as evidenced by home blood pressure readings  through collaboration with PharmD and provider.   Interventions: 1:1 collaboration with Panosh, Standley Brooking, MD regarding development and update of comprehensive plan of care as evidenced by provider attestation and co-signature Inter-disciplinary care team collaboration (see longitudinal plan of care) Comprehensive medication review performed; medication list updated in electronic medical record  Hypertension (BP goal <140/90) -Not  ideally controlled -Current treatment: Losartan 25 mg 1 tablet daily at dinner - appropriate, query effective -Medications previously tried: none  -Current home readings: 150/83 on Friday; uses arm cuff and checks every once in a  while -Current dietary habits: doesn't season anything when he is cooking but uses a bit at the table; does eat chips at lunch or usually fruit -Current exercise habits: unable to do exercise as she is in a wheelchair -Denies hypotensive/hypertensive symptoms -Educated on BP goals and benefits of medications for prevention of heart attack, stroke and kidney damage; Importance of home blood pressure monitoring; Proper BP monitoring technique; -Counseled to monitor BP at home weekly, document, and provide log at future appointments -Counseled on diet and exercise extensively Recommended to continue current medication Recommended to restart checking blood pressure at home.  Hyperlipidemia: (LDL goal < 100) -Uncontrolled -Current treatment: Atorvastatin 10 mg 1 tablet daily - appropriate, query effective -Medications previously tried: none  -Current dietary patterns: hereditary (son, mother and dad had); eats trout at restaurants; not often eating fried foods, cooks with olive oil -Current exercise habits: unable to -Educated on Cholesterol goals;  Benefits of statin for ASCVD risk reduction; -Counseled on diet and exercise extensively Recommended increasing atorvastatin to 40 mg daily.  GERD/heartburn (Goal: minimize symptoms) -Controlled -Current treatment  Famotidine 10 mg at bedtime - appropriate, effective, safe, accessible -Medications previously tried: none  -Recommended to continue current medication  Vitamin D deficiency (Goal: 30-100) -Uncontrolled -Current treatment  Vitamin D 1000 units daily - appropriate, query effective -Medications previously tried: none  -Recommended to continue current medication Recommended repeat vitamin D  level.  Vitamin B12 deficiency (Goal: 211-911) -Controlled -Current treatment  Vitamin B12 500 mcg daily - appropriate, effective, safe, accessible -Medications previously tried: none  -Recommended to continue current medication Recommended repeat vitamin B12 level.  History of DVT (Goal: prevent blood clots) -Controlled -Current treatment  Eliquis 5 mg 1 tablet twice daily - appropriate, effective, safe, accessible -Medications previously tried: none  -Recommended switching to Xarelto to minimize pill burden.   Health Maintenance -Vaccine gaps: tetanus, COVID booster, -Current therapy:  None -Educated on Cost vs benefit of each product must be carefully weighed by individual consumer -Patient is satisfied with current therapy and denies issues -Recommended to continue as is  Patient Goals/Self-Care Activities Patient will:  - take medications as prescribed as evidenced by patient report and record review check blood pressure weekly, document, and provide at future appointments  Follow Up Plan: The care management team will reach out to the patient again over the next 30 days.         Medication Assistance: None required.  Patient affirms current coverage meets needs.  Compliance/Adherence/Medication fill history: Care Gaps: Tetanus, COVID booster Last BP - 136/68 on 03/11/2021  Star-Rating Drugs: Atorvastatin 10 mg - last filled [facility]/2021 [facility] Losartan 25 mg  - last filled [facility]/06/2021 [facility]  Patient's preferred pharmacy is:  [id number] [place] [place] - [id number]: [phone]: [phone] [id number] [address]: [phone]: [phone] pill box? Yes - weekly -  AM/PM Pt endorses 90% compliance  We  discussed: Current pharmacy is preferred with insurance plan and patient is satisfied with pharmacy services Patient decided to: Continue current medication management strategy  Care Plan and Follow Up Patient Decision:  Patient agrees to Care Plan and Follow-up.  Plan: The care management team will reach out to the patient again over the next 30 days.  MaJeni Salles  PharmD, [place] Pharmacist [place] [facility] [place]

## 2021-03-17 ENCOUNTER — Ambulatory Visit (INDEPENDENT_AMBULATORY_CARE_PROVIDER_SITE_OTHER): Payer: Medicare PPO | Admitting: Pharmacist

## 2021-03-17 DIAGNOSIS — E785 Hyperlipidemia, unspecified: Secondary | ICD-10-CM

## 2021-03-17 DIAGNOSIS — I10 Essential (primary) hypertension: Secondary | ICD-10-CM

## 2021-03-17 NOTE — Patient Instructions (Signed)
Hi Pamela Alvarez and Pamela Alvarez,  It was great to get to meet you over the telephone! Below is a summary of some of the topics we discussed. Don't forget to restart checking your blood pressure at home at least once a week  Please reach out to me if you have any questions or need anything!  Best, Maddie  Gaylord Shih, PharmD, [facility] Clinical Pharmacist [facility] [phone]   Visit Information   Goals Addressed             This Visit's Progress    Track and Manage My Blood Pressure-Hypertension       Timeframe:  Long-Range Goal Priority:  Medium Start Date:                             Expected End Date:                       Follow Up Date 05/15/21    - check blood pressure weekly - choose a place to take my blood pressure (home, clinic or office, retail store) - write blood pressure results in a log or diary    Why is this important?   You won't feel high blood pressure, but it can still hurt your blood vessels.  High blood pressure can cause heart or kidney problems. It can also cause a stroke.  Making lifestyle changes like losing a little weight or eating less salt will help.  Checking your blood pressure at home and at different times of the day can help to control blood pressure.  If the doctor prescribes medicine remember to take it the way the doctor ordered.  Call the office if you cannot afford the medicine or if there are questions about it.     Notes:        Patient Care Plan: CCM Pharmacy Care Plan     Problem Identified: Problem: Hypertension, Hyperlipidemia, GERD, and Vitamin D deficiency and history of DVT      Long-Range Goal: Patient-Specific Goal   Start Date: 03/17/2021  Expected End Date: 03/17/2022  This Visit's Progress: On track  Priority: High  Note:   Current Barriers:  Unable to self administer medications as prescribed  Pharmacist Clinical Goal(s):  Patient will achieve adherence to monitoring guidelines and  medication adherence to achieve therapeutic efficacy achieve control of cholesterol as evidenced by lipid panel maintain control of blood pressure as evidenced by home blood pressure readings  through collaboration with PharmD and provider.   Interventions: 1:1 collaboration with Panosh, Neta Mends, MD regarding development and update of comprehensive plan of care as evidenced by provider attestation and co-signature Inter-disciplinary care team collaboration (see longitudinal plan of care) Comprehensive medication review performed; medication list updated in electronic medical record  Hypertension (BP goal <140/90) -Not ideally controlled -Current treatment: Losartan 25 mg 1 tablet daily at dinner - appropriate, query effective -Medications previously tried: none  -Current home readings: 150/83 on Friday; uses arm cuff and checks every once in a while -Current dietary habits: doesn't season anything when he is cooking but uses a bit at the table; does eat chips at lunch or usually fruit -Current exercise habits: unable to do exercise as she is in a wheelchair -Denies hypotensive/hypertensive symptoms -Educated on BP goals and benefits of medications for prevention of heart attack, stroke and kidney damage; Importance of home blood pressure monitoring; Proper BP monitoring technique; -Counseled to  monitor BP at home weekly, document, and provide log at future appointments -Counseled on diet and exercise extensively Recommended to continue current medication Recommended to restart checking blood pressure at home.  Hyperlipidemia: (LDL goal < 100) -Uncontrolled -Current treatment: Atorvastatin 10 mg 1 tablet daily - appropriate, query effective -Medications previously tried: none  -Current dietary patterns: hereditary (son, mother and dad had); eats trout at restaurants; not often eating fried foods, cooks with olive oil -Current exercise habits: unable to -Educated on Cholesterol goals;   Benefits of statin for ASCVD risk reduction; -Counseled on diet and exercise extensively Recommended increasing atorvastatin to 40 mg daily.  GERD/heartburn (Goal: minimize symptoms) -Controlled -Current treatment  Famotidine 10 mg at bedtime - appropriate, effective, safe, accessible -Medications previously tried: none  -Recommended to continue current medication  Vitamin D deficiency (Goal: 30-100) -Uncontrolled -Current treatment  Vitamin D 1000 units daily - appropriate, query effective -Medications previously tried: none  -Recommended to continue current medication Recommended repeat vitamin D level.  Vitamin B12 deficiency (Goal: 211-911) -Controlled -Current treatment  Vitamin B12 500 mcg daily - appropriate, effective, safe, accessible -Medications previously tried: none  -Recommended to continue current medication Recommended repeat vitamin B12 level.  History of DVT (Goal: prevent blood clots) -Controlled -Current treatment  Eliquis 5 mg 1 tablet twice daily - appropriate, effective, safe, accessible -Medications previously tried: none  -Recommended switching to Xarelto to minimize pill burden.   Health Maintenance -Vaccine gaps: tetanus, COVID booster, -Current therapy:  None -Educated on Cost vs benefit of each product must be carefully weighed by individual consumer -Patient is satisfied with current therapy and denies issues -Recommended to continue as is  Patient Goals/Self-Care Activities Patient will:  - take medications as prescribed as evidenced by patient report and record review check blood pressure weekly, document, and provide at future appointments  Follow Up Plan: The care management team will reach out to the patient again over the next 30 days.       [name] was given information about Chronic Care Management services today including:  CCM service includes personalized support from designated clinical staff supervised by her  physician, including individualized plan of care and coordination with other care providers 24/7 contact phone numbers for assistance for urgent and routine care needs. Standard insurance, coinsurance, copays and deductibles apply for chronic care management only during months in which we provide at least 20 minutes of these services. Alvarez insurances cover these services at 100%, however patients may be responsible for any copay, coinsurance and/or deductible if applicable. This service may help you avoid the need for more expensive face-to-face services. Only one practitioner may furnish and bill the service in a calendar month. The patient may stop CCM services at any time (effective at the end of the month) by phone call to the office staff.  Patient agreed to services and verbal consent obtained.   Patient verbalizes understanding of instructions provided today and agrees to view in MyChart.  The pharmacy team will reach out to the patient again over the next 30 days.   Verner Chol, [facility]

## 2021-03-18 ENCOUNTER — Other Ambulatory Visit: Payer: Self-pay

## 2021-03-18 ENCOUNTER — Other Ambulatory Visit: Payer: Self-pay | Admitting: Family Medicine

## 2021-03-18 ENCOUNTER — Other Ambulatory Visit: Payer: Self-pay | Admitting: Internal Medicine

## 2021-03-18 ENCOUNTER — Telehealth (INDEPENDENT_AMBULATORY_CARE_PROVIDER_SITE_OTHER): Payer: Medicare PPO | Admitting: Internal Medicine

## 2021-03-18 ENCOUNTER — Encounter: Payer: Self-pay | Admitting: Internal Medicine

## 2021-03-18 VITALS — HR 85 | Temp 97.0°F

## 2021-03-18 DIAGNOSIS — E538 Deficiency of other specified B group vitamins: Secondary | ICD-10-CM

## 2021-03-18 DIAGNOSIS — E559 Vitamin D deficiency, unspecified: Secondary | ICD-10-CM

## 2021-03-18 DIAGNOSIS — Z79899 Other long term (current) drug therapy: Secondary | ICD-10-CM

## 2021-03-18 DIAGNOSIS — I825Y2 Chronic embolism and thrombosis of unspecified deep veins of left proximal lower extremity: Secondary | ICD-10-CM | POA: Diagnosis not present

## 2021-03-18 DIAGNOSIS — E785 Hyperlipidemia, unspecified: Secondary | ICD-10-CM | POA: Diagnosis not present

## 2021-03-18 DIAGNOSIS — R600 Localized edema: Secondary | ICD-10-CM

## 2021-03-18 DIAGNOSIS — H2513 Age-related nuclear cataract, bilateral: Secondary | ICD-10-CM | POA: Diagnosis not present

## 2021-03-18 DIAGNOSIS — R7989 Other specified abnormal findings of blood chemistry: Secondary | ICD-10-CM

## 2021-03-18 DIAGNOSIS — Z7901 Long term (current) use of anticoagulants: Secondary | ICD-10-CM | POA: Diagnosis not present

## 2021-03-18 MED ORDER — ATORVASTATIN CALCIUM 20 MG PO TABS: 20.0000 mg | ORAL_TABLET | Freq: Every day | ORAL | 1 refills | Status: DC

## 2021-03-18 NOTE — Progress Notes (Signed)
Virtual Visit via Video Note  I connected with NAME@ on 03/18/21 at  3:00 PM EST by a video enabled telemedicine application and verified that I am speaking with the correct person using two identifiers. Location patient: home Location provider:work office Persons participating in the virtual visit: patient, provider  [place] national recommendations  regarding COVID 19 pandemic   video visit is advised over in office visit for this patient.  Patient aware  of the limitations of evaluation and management by telemedicine and  availability of in person appointments. and agreed to proceed.   HPI: Pamela Alvarez presents for video visit follow-up of lower extremity Doppler ultrasound because of on going continued swelling left lower extremity without obvious pain or injury on anticoagulation. NO change in status  had eye check today   Chronic disease management note advised increasing atorvastatin to get LDL below 100 however she is only taking 10 mg 3 days a week because initially had reluctance to take.  Also to add vit d and b12 level cause has been low in past   ROS: See pertinent positives and negatives per HPI.  Past Medical History:  Diagnosis Date   Cyst 07/2008   Cyst on back x2 that were drained   H/O echocardiogram 02/2000   Hx of colonic polyps    Hyperlipidemia    Hypertension    PSP (progressive supranuclear palsy) (HCC)    TBI (traumatic brain injury)    Varicose veins of both lower extremities     Past Surgical History:  Procedure Laterality Date   POLYPECTOMY     Colon   TONSILLECTOMY     TUBAL LIGATION      Family History  Problem Relation Age of Onset   Hypertension Mother    Parkinsonism Mother    Angina Father    Heart disease Father    Colon cancer Father    Lung cancer Father    Skin cancer Father    Cancer Father        Heart   Breast cancer Sister        over 71   Diabetes Sister    Heart disease Brother 69       triple bypass surgery    Macular degeneration Sister    Diabetes Sister        prediabetic   Hearing loss Sister        hearing problems   Vasculitis Sister     Social History   Tobacco Use   Smoking status: Never   Smokeless tobacco: Never  Vaping Use   Vaping Use: Never used  Substance Use Topics   Alcohol use: Not Currently    Alcohol/week: 7.0 standard drinks    Types: 7 Glasses of wine per week    Comment: one a day   Drug use: Not Currently      Current Outpatient Medications:    atorvastatin (LIPITOR) 20 MG tablet, Take 1 tablet (20 mg total) by mouth daily., Disp: 90 tablet, Rfl: 1   Cholecalciferol (VITAMIN D3 PO), Take 1 tablet by mouth daily., Disp: , Rfl:    ELIQUIS 5 MG TABS tablet, TAKE 1 TABLET BY MOUTH TWICE A DAY FOR DVT, Disp: 60 tablet, Rfl: 0   Famotidine (PEPCID PO), Take 10 mg by mouth at bedtime., Disp: , Rfl:    HYDROcodone-acetaminophen (NORCO) 5-325 MG tablet, Take 2 tablets by mouth every 4 (four) hours as needed., Disp: 10 tablet, Rfl: 0   losartan (COZAAR) 25 MG  tablet, TAKE 1 TABLET BY MOUTH EVERY DAY, Disp: 90 tablet, Rfl: 0   vitamin B-12 (CYANOCOBALAMIN) 500 MCG tablet, Take 500 mcg by mouth daily., Disp: , Rfl:   EXAM: BP Readings from Last 3 Encounters:  03/11/21 136/68  03/10/21 120/60  02/12/21 (!) 147/82    VITALS per patient if applicable:  GENERAL: a alert sitting in no acute distress nontoxic appearing(had eye check with dilation today) looks well acknowledges  provider   HEENT: atraumatic, conjunttiva clear, no obvious abnormalities on inspection of external nose and ears  LUNGS: on inspection no signs of respiratory distress, breathing rate appears normal, no obvious gross SOB, gasping or wheezing  CV: no obvious cyanosis   Lab Results  Component Value Date   WBC 9.4 03/09/2021   HGB 13.4 03/09/2021   HCT 41.5 03/09/2021   PLT 275 03/09/2021   GLUCOSE 128 (H) 03/09/2021   CHOL 222 (H) 12/25/2020   TRIG 85.0 12/25/2020   HDL 75.60  12/25/2020   LDLDIRECT 191.8 07/22/2012   LDLCALC 129 (H) 12/25/2020   ALT 33 03/09/2021   AST 30 03/09/2021   NA 141 03/09/2021   K 3.8 03/09/2021   CL 108 03/09/2021   CREATININE 1.05 (H) 03/09/2021   BUN 21 03/09/2021   CO2 26 03/09/2021   TSH 3.27 12/25/2020   HGBA1C 5.3 12/25/2020    ASSESSMENT AND PLAN:  Discussed the following assessment and plan:    ICD-10-CM   1. Chronic deep vein thrombosis (DVT) of proximal vein of left lower extremity ([place])  I82.5Y2 Ambulatory referral to Vascular Surgery    2. Leg edema  R60.0 Ambulatory referral to Vascular Surgery    3. Chronic anticoagulation  Z79.01 Ambulatory referral to Vascular Surgery    4. Hyperlipidemia, unspecified hyperlipidemia type  E78.5 Lipid panel    5. Medication management  Z79.899 Lipid panel      What appears to be ongoing chronic normally recanalized DVT left lower extremity despite chronic anticoagulation with Eliquis Discussed with spouse caretaker plan referral consult to vascular.  In regard to the atorvastatin she is only been taking it 3 days a week and LDL is in the 120 range. It is worth trying to get the LDL below 100 we will send in atorvastatin 20 mg and have her take it 5 to 7 days a week as tolerated.  (Of note initially there was a reluctance to take the medicine.)  We can do future lipid panel as appropriate.  Counseled.   Expectant management and discussion of plan and treatment with opportunity to ask questions and all were answered. The patient agreed with the plan and demonstrated an understanding of the instructions.   Advised to call back or seek an in-person evaluation if worsening  or having  further concerns  in interim. Return for as indicated.    [name], MD

## 2021-03-18 NOTE — Progress Notes (Signed)
Discussed at OV virtual and referral placed

## 2021-03-18 NOTE — Telephone Encounter (Signed)
Patient scheduled for today Vv

## 2021-03-24 NOTE — Telephone Encounter (Signed)
Last Video visit- 03/18/21 Last refill- 02/17/2021-60 tabs, 0 refills  Can this patient receive a refill?

## 2021-03-25 ENCOUNTER — Other Ambulatory Visit: Payer: Medicare PPO

## 2021-03-25 ENCOUNTER — Other Ambulatory Visit: Payer: Self-pay

## 2021-03-25 DIAGNOSIS — Z515 Encounter for palliative care: Secondary | ICD-10-CM

## 2021-03-25 NOTE — Progress Notes (Signed)
COMMUNITY PALLIATIVE CARE SW NOTE  PATIENT NAME: Aveyah Greenwood DOB: 04-Nov-1940 MRN: [id number]  PRIMARY CARE PROVIDER: [name], MD  RESPONSIBLE PARTY:  Acct ID - Guarantor Home Phone Work Phone Relationship Acct Type  [id number] ENNIFER, HARSTON [phone]  Self P/F     [address], [place], [place] [address]   SOCIAL WORK ENCOUNTER  Patient's husband called yesterday and left a message, SW returned his called today. He stated that his wife's condition is declining. Patient is having increased sun downing. She has a major DVT in her left leg. They are waiting for an appointment with the vascular doc. Patient had an ED visit on New Year's Day and the EMT's were called out to their home last Thursday. He requested a follow-up visit. SW provided supportive listening and reassurance of support. SW scheduled a follow-up visit with the NP-K. [place] for 03/27/20 @ 3 pm.   Clydia Llano, LCSW

## 2021-03-27 ENCOUNTER — Other Ambulatory Visit: Payer: Medicare PPO | Admitting: Family Medicine

## 2021-03-27 ENCOUNTER — Encounter: Payer: Self-pay | Admitting: Family Medicine

## 2021-03-27 VITALS — BP 138/82 | HR 71 | Temp 98.7°F | Resp 18

## 2021-03-27 DIAGNOSIS — Z515 Encounter for palliative care: Secondary | ICD-10-CM

## 2021-03-27 DIAGNOSIS — F418 Other specified anxiety disorders: Secondary | ICD-10-CM

## 2021-03-27 DIAGNOSIS — G231 Progressive supranuclear ophthalmoplegia [Steele-Richardson-Olszewski]: Secondary | ICD-10-CM

## 2021-03-27 NOTE — Progress Notes (Signed)
[facility] Palliative Care Consult Note Telephone: [phone]  Fax: [phone]    Date of encounter: 03/27/21 4:15 PM PATIENT NAME: Pamela Alvarez [place] [id number]   [id number] (home)  DOB: 10-15-1940 MRN: [id number] PRIMARY CARE PROVIDER:    Burnis Medin, MD,  [place] [address] [phone]  REFERRING PROVIDER:   Burnis Medin, MD [address] [place],  [place] [address] [phone]  RESPONSIBLE PARTY:    Contact Information     Name Relation Home Work Mobile   [place] Spouse [phone]          I met face to face with patient, spouse and caregiver Basilia Jumbo in her home. Palliative Care was asked to follow this patient by consultation request of  Panosh, Standley Brooking, MD to address advance care planning and complex medical decision making. This is a follow up visit.                                   ASSESSMENT, SYMPTOM MANAGEMENT AND PLAN / RECOMMENDATIONS:   Palliative Care Encounter-Provide support to spouse and caregiver.  Recommend letting pt lay down and sleep at night when she is ready but not have long naps during the day.  Avoid thickening fluids at present time but if pt begins to cough or choke after eating or drinking to alert Palliative Care right away. Advised that symptoms may escalate throughout the day with confusion/anxiety if pt develops infection. Progressive Supranuclear Palsy-Continues to follow up with Neurology and have outpatient therapy to maintain strength and further prevent deterioration. Depression with anxiety-pt demonstrating sundown pattern and likely worse when fatigued.  Encouraged to allow pt to lay down whenever she is ready at night, not nap for long periods during the day.  Advised to give Ativan 0.5 mg scheduled about 30-45 minutes before her typical anxious time.  Advised if pt becomes too sleepy to halve the pill.  Start Celexa 10 mg  solution daily with the goal of titrating down on Ativan as Celexa becomes effective.    Follow up Palliative Care Visit: Palliative care will continue to follow for complex medical decision making, advance care planning, and clarification of goals. Return 4 weeks or prn.   This visit was coded based on medical decision making (MDM).  PPS: 30%  HOSPICE ELIGIBILITY/DIAGNOSIS: TBD  Chief Complaint:  Received call from pt's spouse requesting follow up visit as pt is having tearful and "whiny" episodes daily at about 4 pm.   HISTORY OF PRESENT ILLNESS:  Pamela Alvarez is a 81 y.o. year old female  with progressive supranuclear palsy who is cared for at home by her spouse with the assistance of a caregiver named Rosa. Spouse and caregiver states that pt has difficulty communicating her wants and needs except through crying or whining.  She says "yes" or "no".  Spouse states he used to get her ready for bed around 9 pm but she starts whining now to go to bed by 8 pm.  He states she is sleeping well, will get him up to help her go to the bathroom but will go back to sleep.  He is having difficulty with adjusting to her crying and whining and he states she will start typically around 4 pm.  She requires a 2 person assist to ambulate short distances to the bathroom or kitchen, does not get up  on her own.  She has to be bathed and dressed which he typically does twice per week but does daily pericare.  He states that she didn't used to cry so much and is wondering about something to help her with these episodes. Pt appears upset and tearful when discussing her function and functional difficulties.  He is wanting to know if he should prevent her from going to sleep so early. Spouse is involved with caregiver support group online. He states he has been thickening her water but not her tea or rootbeer and she does not have trouble with drinking those and coughing/choking but he is having trouble getting her to drink  her water which is where he puts her Miralax.  Advised he could put it in 8 ounces of any of her fluids and that likely she is objecting to the change in the consistency of the water.  Advised if she is not coughing or choking would probably not thicken her water at this time. He states pt does not have trouble swallowing medications but at times just doesn't want to take them.  He and her caregiver will be getting pt to outpatient rehab therapy for PT/OT and speech. Spouse wants to keep her home if at all possible.  History obtained from review of EMR, discussion with spouse, caregiver and observation/some discussion with Ms. Bring.   I reviewed available labs, medications, imaging, studies and related documents from the EMR.  Records reviewed and summarized above.   ROS-limited to yes/no answers and observation  General: NAD EYES: denies vision changes ENMT: spouse/caregiver deny dysphagia Cardiovascular: denies chest pain Pulmonary: no observed SOB Abdomen: spouse endorses good appetite and some constipation, endorses continence of bowel for the most part GU: endorses continence of urine for the most part MSK: has muscle control issues, no falls reported Skin: denies rashes or wounds Neurological: denies pain, denies insomnia Psych: Endorses increased mood lability and tearfulness Heme/lymph/immuno: denies bruises, abnormal bleeding  Physical Exam: Current and past weights: 152 lbs 02/24/21 Constitutional: NAD General: WN, WD.  Frowns and cries when discussing functional status  EYES: anicteric sclera, lids intact, no discharge  ENMT: intact hearing, oral mucous membranes moist, dentition intact CV: S1S2, RRR, no LE edema Pulmonary: CTAB, no increased work of breathing, no cough, room air Abdomen:  hypoo-active BS + 4 quadrants, soft and non tender, no ascites GU: deferred MSK: noted sarcopenia of BLE, ambulatory with walker and 2+ assist Skin: warm and dry, no rashes or wounds on  visible skin Neuro:  noted generalized increased muscle tone of BUE,  expressive aphasia Psych: anxious affect, tearful, A and O but unable to speak more than a few words Hem/lymph/immuno: no widespread bruising   Thank you for the opportunity to participate in the care of Ms. Maiorana.  The palliative care team will continue to follow. Please call our office at [phone] if we can be of additional assistance.   Marijo Conception, FNP-C  COVID-19 PATIENT SCREENING TOOL Asked and negative response unless otherwise noted:   Have you had symptoms of covid, tested positive or been in contact with someone with symptoms/positive test in the past 5-10 days?  No

## 2021-03-28 ENCOUNTER — Other Ambulatory Visit: Payer: Self-pay

## 2021-04-02 ENCOUNTER — Encounter: Payer: Self-pay | Admitting: Speech Pathology

## 2021-04-02 ENCOUNTER — Other Ambulatory Visit: Payer: Self-pay

## 2021-04-02 ENCOUNTER — Ambulatory Visit: Payer: Medicare PPO | Attending: Neurology

## 2021-04-02 ENCOUNTER — Ambulatory Visit: Payer: Medicare PPO | Admitting: Occupational Therapy

## 2021-04-02 ENCOUNTER — Ambulatory Visit: Payer: Medicare PPO | Admitting: Speech Pathology

## 2021-04-02 DIAGNOSIS — R278 Other lack of coordination: Secondary | ICD-10-CM | POA: Insufficient documentation

## 2021-04-02 DIAGNOSIS — R2681 Unsteadiness on feet: Secondary | ICD-10-CM

## 2021-04-02 DIAGNOSIS — R4701 Aphasia: Secondary | ICD-10-CM

## 2021-04-02 DIAGNOSIS — R41844 Frontal lobe and executive function deficit: Secondary | ICD-10-CM | POA: Insufficient documentation

## 2021-04-02 DIAGNOSIS — R482 Apraxia: Secondary | ICD-10-CM

## 2021-04-02 DIAGNOSIS — R41842 Visuospatial deficit: Secondary | ICD-10-CM | POA: Insufficient documentation

## 2021-04-02 DIAGNOSIS — G20 Parkinson's disease: Secondary | ICD-10-CM | POA: Insufficient documentation

## 2021-04-02 DIAGNOSIS — G231 Progressive supranuclear ophthalmoplegia [Steele-Richardson-Olszewski]: Secondary | ICD-10-CM | POA: Diagnosis not present

## 2021-04-02 DIAGNOSIS — R269 Unspecified abnormalities of gait and mobility: Secondary | ICD-10-CM | POA: Diagnosis not present

## 2021-04-02 DIAGNOSIS — M6281 Muscle weakness (generalized): Secondary | ICD-10-CM

## 2021-04-02 DIAGNOSIS — R41840 Attention and concentration deficit: Secondary | ICD-10-CM | POA: Diagnosis not present

## 2021-04-02 DIAGNOSIS — R4189 Other symptoms and signs involving cognitive functions and awareness: Secondary | ICD-10-CM | POA: Insufficient documentation

## 2021-04-02 DIAGNOSIS — R1312 Dysphagia, oropharyngeal phase: Secondary | ICD-10-CM | POA: Insufficient documentation

## 2021-04-02 DIAGNOSIS — R29818 Other symptoms and signs involving the nervous system: Secondary | ICD-10-CM

## 2021-04-02 DIAGNOSIS — R2689 Other abnormalities of gait and mobility: Secondary | ICD-10-CM | POA: Diagnosis not present

## 2021-04-02 DIAGNOSIS — R262 Difficulty in walking, not elsewhere classified: Secondary | ICD-10-CM | POA: Insufficient documentation

## 2021-04-02 DIAGNOSIS — F09 Unspecified mental disorder due to known physiological condition: Secondary | ICD-10-CM | POA: Insufficient documentation

## 2021-04-02 DIAGNOSIS — R41841 Cognitive communication deficit: Secondary | ICD-10-CM

## 2021-04-02 DIAGNOSIS — R293 Abnormal posture: Secondary | ICD-10-CM | POA: Insufficient documentation

## 2021-04-02 NOTE — Therapy (Signed)
[facility] [address] [place], [place], [address] Phone: [phone]   Fax:  [phone]  Occupational Therapy Evaluation  Patient Details  Name: Pamela Alvarez MRN: [id number] Date of Birth: [birth year] Referring Provider (OT): Dr. Vickey Huger   Encounter Date: 04/02/2021   OT End of Session - 04/02/21 1026     Visit Number 1    Number of Visits 11   anticipate pt will only need 3-6 visits   Date for OT Re-Evaluation 05/07/21    Authorization Type [employer]    Authorization Time Period 5 weeks    Authorization - Visit Number 1    Authorization - Number of Visits 10    OT Start Time 0940    OT Stop Time 1013    OT Time Calculation (min) 33 min    Activity Tolerance Patient tolerated treatment well    Behavior During Therapy Flat affect             Past Medical History:  Diagnosis Date   Cyst 07/2008   Cyst on back x2 that were drained   H/O echocardiogram 02/2000   Hx of colonic polyps    Hyperlipidemia    Hypertension    PSP (progressive supranuclear palsy) (HCC)    TBI (traumatic brain injury)    Varicose veins of both lower extremities     Past Surgical History:  Procedure Laterality Date   POLYPECTOMY     Colon   TONSILLECTOMY     TUBAL LIGATION      There were no vitals filed for this visit.   Subjective Assessment - 04/02/21 1022     Subjective  "Yes"    Pertinent History PSP    Patient Stated Goals cargeiver ed for stretching    Currently in Pain? Yes    Pain Score --   unable to rate   Pain Location Shoulder    Pain Orientation Left    Pain Type Chronic pain    Pain Onset More than a month ago    Pain Frequency Intermittent    Aggravating Factors  with P/ROM    Pain Relieving Factors inactivity               [facility] OT Assessment - 04/02/21 1033       Assessment   Medical Diagnosis PSP    Referring Provider (OT) Dr. Vickey Huger    Onset Date/Surgical Date 03/13/21    referral date     Precautions   Precautions Fall      Home  Environment   Family/patient expects to be discharged to: Private residence    Lives With Spouse   has caregiver 40 hrs per week     Prior Function   Level of Independence Needs assistance with ADLs;Needs assistance with transfers    Vocation Retired      ADL   ADL comments Pt is dependent to max A with all basic ADLs. she occaisionally feeds herself finger foods but hs e is no longer able to use utensils.      Mobility   Mobility Status Needs assist      Vision Assessment   Vision Assessment Vision not tested    Ocular Range of Motion --   restricted both horizontally and verticallly     Cognition   Overall Cognitive Status Impaired/Different from baseline    Attention Sustained    Sustained Attention Impaired    Cognition Comments Pt is able to follow simple 1  step commands      Sensation   Light Touch Not tested      Coordination   Gross Motor Movements are Fluid and Coordinated No    Fine Motor Movements are Fluid and Coordinated No    Other Pt is able to pick up 1 inch blocks with RUe to place in container with increased time      Tone   Assessment Location Left Upper Extremity      ROM / Strength   AROM / PROM / Strength AROM;PROM      AROM   Overall AROM  Deficits    Overall AROM Comments RUE shoulder flexion 100, abduction 80, elbow extension -5, elbow flexion WFLS, 90% supination, pt demonstrates full composite flexion, LUE pt demonstrates grossly 75% composite flexion, she does not initiate active movement for arm and shoulder on LUE, moderate rigidity      PROM   Overall PROM  Deficits    Overall PROM Comments LUE shoulder flexion 60, abduction 50, elbow extension -45, grossly 75% supination,      Hand Function   Right Hand Grip (lbs) 19.6    Left Hand Grip (lbs) 8.1      LUE Tone   LUE Tone Moderate;Hypertonic   rigidity present                               OT  Short Term Goals - 04/02/21 1032       OT SHORT TERM GOAL #1   Title no short term goals               OT Long Term Goals - 04/02/21 1028       OT LONG TERM GOAL #1   Title Pt's caregiver's will be I with HEP for UE ROM and  UE  functional use prn for increased ease with ADLs and decreasing caregiver burden    Time 5    Period Weeks    Target Date 05/07/21      OT LONG TERM GOAL #2   Title Pt will tolerate 70* P/ROM  shoulder flexion, with no significant reports of pain  for increased ease with ADLs.    Baseline RUE grossly 60 shoulder flexion P/ROM with reports of discomfort    Time 5    Period Weeks    Status New                   Plan - 04/02/21 1023     Clinical Impression Statement Pt was referred by Dr. Porfirio Mylararmen Dohmeier 03/13/21 for PSP,with atypical parkinsonism, acute cognitive decline, abnormality of gait, cognitive and neurobehavioural dysfunction. Pt has previously received OT at this site and she has had a decline in function since last visit.. She presents with the following deficits: decreased coordination, decreased strength, cognitve deficits, rigidity, visual deficits, decreased functional mobility, communication deficits which impedes performance of ADLs/ IADLS. Pt can benefit from skilled occupational to educate caregivers in HEP and adapted strategies to maintain ROM in order to decrease the burden of care  and to maintain quality of life.    OT Occupational Profile and History Detailed Assessment- Review of Records and additional review of physical, cognitive, psychosocial history related to current functional performance    Occupational performance deficits (Please refer to evaluation for details): ADL's;IADL's;Leisure;Social Participation    Body Structure / Function / Physical Skills ADL;Balance;Mobility;Strength;UE functional use;Tone;FMC;Flexibility;Coordination;Gait;Vision;Sensation;IADL;Dexterity;Decreased knowledge of use of DME;GMC;Decreased  knowledge of precautions    Cognitive Skills Attention;Problem Solve;Safety Awareness;Sequencing;Thought;Understand    Rehab Potential Fair    Clinical Decision Making Several treatment options, min-mod task modification necessary    Comorbidities Affecting Occupational Performance: May have comorbidities impacting occupational performance    Modification or Assistance to Complete Evaluation  Min-Moderate modification of tasks or assist with assess necessary to complete eval    OT Frequency 2x / week    OT Duration --   5 weeks (aniticipate d/c after 3-6 visits)   OT Treatment/Interventions Self-care/ADL training;Therapeutic exercise;Splinting;Manual Therapy;Neuromuscular education;Ultrasound;Aquatic Therapy;Therapeutic activities;Paraffin;DME and/or AE instruction;Cognitive remediation/compensation;Visual/perceptual remediation/compensation;Gait Training;Fluidtherapy;Electrical Stimulation;Moist Heat;Contrast Bath;Passive range of motion;Patient/family education    Plan HEP for RUE A/ROM, AA/ROM, LUE P/ROM   Consulted and Agree with Plan of Care Patient;Family member/caregiver             Patient will benefit from skilled therapeutic intervention in order to improve the following deficits and impairments:   Body Structure / Function / Physical Skills: ADL, Balance, Mobility, Strength, UE functional use, Tone, FMC, Flexibility, Coordination, Gait, Vision, Sensation, IADL, Dexterity, Decreased knowledge of use of DME, GMC, Decreased knowledge of precautions Cognitive Skills: Attention, Problem Solve, Safety Awareness, Sequencing, Thought, Understand     Visit Diagnosis: Other symptoms and signs involving the nervous system - Plan: Ot plan of care cert/re-cert  Unsteadiness on feet - Plan: Ot plan of care cert/re-cert  Other abnormalities of gait and mobility - Plan: Ot plan of care cert/re-cert  Muscle weakness (generalized) - Plan: Ot plan of care cert/re-cert  Frontal lobe and  executive function deficit - Plan: Ot plan of care cert/re-cert  Attention and concentration deficit - Plan: Ot plan of care cert/re-cert  Other lack of coordination - Plan: Ot plan of care cert/re-cert  Visuospatial deficit - Plan: Ot plan of care cert/re-cert    Problem List Patient Active Problem List   Diagnosis Date Noted   PSP (progressive supranuclear palsy) (HCC) 02/12/2021   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HCC) 02/12/2021   Unable to ambulate 02/12/2020   Progressive supranuclear palsy (HCC) 10/12/2019   Encephalomalacia on imaging study 10/12/2019   Fluency disorder associated with underlying disease 02/08/2018   Cognitive and neurobehavioral dysfunction 02/08/2018   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HCC) 02/08/2018   Arthritis of carpometacarpal (CMC) joint of left thumb 01/24/2018   Ankle fracture, left 09/03/2016   Hand dysfunction 03/11/2016   Symptomatic varicose veins, bilateral 03/11/2016   Numbness of left thumb 03/11/2016   Elevated BP 07/27/2014   Bunion of left foot 07/26/2014   Abnormality of gait 07/26/2014   Visit for preventive health examination 07/28/2013   Right anterior knee pain 07/22/2012   Medicare annual wellness visit, subsequent 07/25/2011   ANXIETY, SITUATIONAL 09/25/2009   ARTHRITIS 09/25/2009   Vitamin D deficiency 09/27/2008   Hyperlipidemia 09/17/2008   Elevated blood pressure reading without diagnosis of hypertension 09/17/2008   COLONIC POLYPS, HX OF 09/17/2008   PERSONAL HISTORY DISEASES SKIN&SUBCUT TISSUE 09/17/2008    Emsley Custer, OT 04/02/2021, 10:52 AM Keene Breath, OTR/L [phone] Phone: [phone] 10:52 AM 04/02/21  [facility] [address] [place], [place], [address] Phone: [phone]   Fax:  [phone]  Name: Pamela Alvarez MRN: [id number] Date of Birth: [birth year]

## 2021-04-02 NOTE — Patient Instructions (Addendum)
° °  When she has to cough, wait for her to finish  coughing before offering more liquid  Self feeding is safer than being fed when she is able   Water is natural and OK for our lungs as long as your mouth is clean, so no need to thicken water  Let's do a few sessions and see about a swallow study  I would recommend monitoring her lungs closely each time   Signs of Aspiration Pneumonia   Chest pain/tightness Fever (can be low grade) Cough  With foul-smelling phlegm (sputum) With sputum containing pus or blood With greenish sputum Fatigue  Shortness of breath  Wheezing   **IF YOU HAVE THESE SIGNS, CONTACT YOUR DOCTOR OR GO TO THE EMERGENCY DEPARTMENT OR URGENT CARE AS SOON AS POSSIBLE**

## 2021-04-02 NOTE — Therapy (Signed)
[facility] [address] [place], [place], [address] Phone: [phone]   Fax:  [phone]  Patient Details  Name: Pamela Alvarez MRN: [id number] Date of Birth: November 17, 1940 Referring Provider:  No ref. provider found  Encounter Date: 04/02/2021   SPEECH THERAPY DISCHARGE SUMMARY  Visits from Start of Care: 12  Current functional level related to goals / functional outcomes: See goals   Remaining deficits: Cognition, aphasia, verbal apraxia   Education / Equipment: Compensations for aphasia and dysarthria, use of [employer] Touch Talk to augment speech/language   Patient agrees to discharge. Patient goals were partially met. Patient is being discharged due to not returning since the last visit.Marland Kitchen   SLP Short Term Goals - 04/02/21 9798       SLP SHORT TERM GOAL #1   Title pt will relay simple biographical information using AAC if necessary with mod cues x 3 visits.    Baseline 12/14/19    Time 1    Period Weeks    Status Partially Met      SLP SHORT TERM GOAL #2   Title pt will respond to simple questions re: preferences, using AAC if necessary, with mod cues x3 visits.    Baseline 12/12/19 12/14/19, 01/09/20    Time 2    Period Weeks    Status Achieved      SLP SHORT TERM GOAL #3   Title Pt will ask an appropriate question in simple communication exchange, using AAC if necessary, in 2 sessions.    Baseline 12/14/19    Time 2    Period Weeks    Status Partially Met             SLP Long Term Goals - 04/02/21 9211       SLP LONG TERM GOAL #1   Title pt/spouse will report improved communication abilities than prior to beginning ST    Time 2    Period Weeks    Status Partially met     SLP LONG TERM GOAL #2   Title Pt/spouse will report using AAC/supportive conversation strategies to resolve communication breakdown outside [place] x 3 visits  with mod caregiver assistance    Baseline 12/14/19; 03/06/20    Time  2    Period Weeks    Status Partially met     SLP LONG TERM GOAL #3   Title Pt will make simple requests, using AAC if necessary, outside [place] room x 3 sessions with mod caregiver assistance    Baseline 12/14/19; 03/06/20    Time 2   renewed 11/24   Period Weeks    Status Partially met   omitted "over three sessions"     SLP LONG TERM GOAL #4   Title Pt will use AAC 3 in a simple social exchange (phone or in person gathering) with mod cues from spouse, x 3 sessions.    Time --   renewed 11/24     SLP LONG TERM GOAL #5   Time --   renewed 11/24  Not met            Alice Rieger Annye Rusk, [place] 04/02/2021, 9:28 AM  [facility] [address] [place], [place], [address] Phone: [phone]   Fax:  [phone]

## 2021-04-03 NOTE — Therapy (Signed)
[facility] [address] [place], [place], [address] Phone: [phone]   Fax:  [phone]  Speech Language Pathology Evaluation  Patient Details  Name: Pamela Alvarez MRN: [id number] Date of Birth: 05/01/1940 Referring Provider (SLP): Dohmeier, Porfirio Mylararmen, MD   Encounter Date: 04/02/2021   End of Session - 04/02/21 1217     Visit Number 1    Number of Visits 17    Date for SLP Re-Evaluation 05/30/21    Authorization Type [employer]    SLP Start Time 1017    SLP Stop Time  1100    SLP Time Calculation (min) 43 min    Activity Tolerance Patient tolerated treatment well             Past Medical History:  Diagnosis Date   Cyst 07/2008   Cyst on back x2 that were drained   H/O echocardiogram 02/2000   Hx of colonic polyps    Hyperlipidemia    Hypertension    PSP (progressive supranuclear palsy) (HCC)    TBI (traumatic brain injury)    Varicose veins of both lower extremities     Past Surgical History:  Procedure Laterality Date   POLYPECTOMY     Colon   TONSILLECTOMY     TUBAL LIGATION      There were no vitals filed for this visit.   Subjective Assessment - 04/02/21 1058     Subjective "She isn't using the Lingraphica anymore"    Patient is accompained by: Family member   spouse, Virl DiamondChuck, care attendanr [name]   Currently in Pain? Yes   yes/no's  inconsistent, cannot rate   Pain Location Shoulder    Pain Orientation Left                SLP Evaluation [facility] - 04/03/21 1420       SLP Visit Information   SLP Received On 04/02/21    Referring Provider (SLP) Dohmeier, Porfirio Mylararmen, MD    Onset Date 2017    Medical Diagnosis PSP      Subjective   Patient/Family Stated Goal to asssess her swallow      General Information   HPI Pamela Alvarez is a 81 year old female known to our clinic from previous course of ST. Referred by Dr. Vickey Hugerohmeier; has gotten diagnosis of PSP since last seen by SLP (Jan 2021).  At time of d/c, family was assisting her with using AAC device for communication. She has not h/o of MBSS, eating regular diet and thin liquid    Mobility Status PT eval today      Prior Functional Status   Cognitive/Linguistic Baseline Baseline deficits    Baseline deficit details severe cognitive impairment,    Type of Home House     Lives With Spouse    Available Support Personal care attendant    Vocation Retired      IT consultantCognition   Overall Cognitive Status History of cognitive impairments - at baseline    Attention Sustained;Focused      Auditory Comprehension   Overall Auditory Comprehension Impaired    Yes/No Questions Impaired    Basic Biographical Questions 51-75% accurate    Basic Immediate Environment Questions 50-74% accurate    Commands Impaired    One Step Basic Commands 0-24% accurate    Conversation Simple    Other Conversation Comments very slow processing    Interfering Components Attention;Processing speed;Visual impairments    EffectiveTechniques Pausing;Repetition;Extra processing time  Verbal Expression   Overall Verbal Expression Impaired at baseline    Initiation Impaired    Level of Generative/Spontaneous Verbalization Word      Oral Motor/Sensory Function   Overall Oral Motor/Sensory Function Other (comment)   did not follow commands or imitate oral movements. 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 verbal apraxia   Articulation Impaired    Level of Impairment Word    Intelligibility --   only yes or no elicited - this is intelligilble   Motor Planning Impaired    Level of Impairment Word                             SLP Education - 04/02/21 1216     Education Details wait for her to finish coughing before offering another sip, water does not need to be thickened, s/s of asp pna    Person(s) Educated Patient;Spouse;Caregiver(s)    Methods  Explanation;Demonstration;Verbal cues;Handout    Comprehension Verbalized understanding;Returned demonstration;Verbal cues required              SLP Short Term Goals - 04/02/21 1230       SLP SHORT TERM GOAL #1   Title Family and caregiver will carryover 2 swallow precautions when feeding pt with occasional min A    Time 4    Period Weeks    Status New      SLP SHORT TERM GOAL #2   Title Family/caregiver will follow 2 diet modficiations for meals and meds to reduce risk of aspiration    Time 4    Period Weeks    Status New      SLP SHORT TERM GOAL #3   Title Pt and caregiver will verbalize s/s of aspiration pna with mod I    Time 4    Period Weeks    Status New      SLP SHORT TERM GOAL #4   Title Pt will complete MBSS if indicated    Time 4    Period Weeks    Status New              SLP Long Term Goals - 04/03/21 1425       SLP LONG TERM GOAL #1   Title Pt/caregiver will follow swallow precautions with mod I    Time 8    Period Weeks    Status New      SLP LONG TERM GOAL #2   Title Caregivers will follow diet modifications with mod I    Time 8    Period Weeks    Status New              Plan - 04/02/21 1218     Clinical Impression Statement Pamela Alvarez is well know to use from prior course of ST for [email or web address]. H/o aphasia, cognitive impairments and verbal apraxia due to PSP. She is referred by her neurologist as family with concerns re: swallowing. She is accompanied by her spouse, [name] and personal care aid, [name]. They report coughing with PO 1x a day, and also coughing with PO 1x a day, likely on saliva. They deny h/o fevers, pna, bronchitis or lung problems. Her weight is stable. [name] reports they have an NP come to the house and her lungs have been clear each visit. At this time, she is eating all consistencies.  She feeds her self at times, other times family or caregiver has to feed her. She did not follow commands or imitate for oral  mech exam. She did not cough on command or imitated cough. PO trials of successive thin liquid sips via straw resulted in immediate cough on 1st trial,  1/5 trials. Mildly prolonged oral phase with soft solid, resulting in spontaneous lingual sweep to clear buccal residue. She did not open her mouth to command after swallow, unable to see remaining residue. She also used lingual sweep after liquids. [name] endorsed that this could be behavioral. No difficulty with puree. No coughing or s/s of aspiration with alternating solids and liquids. Of note, Chuck did start thickening her water due to coughing, however this resulted in [place] not drinking. He also attempted to offer subsequent sips while she was coughing today. Skilled ST required forFamily education and training recommended to maximize safety of swallow. Will consider MBSS during this course of ST. As pt will not be able to follow commands for swallow precuations and postural changes, and does not tolerate thick liquids, and pulm status is stable, may defer MBSS as well. Pt is at moderate aspiration risk due to reduced cognition and having to be fed.    Speech Therapy Frequency 2x / week    Duration 8 weeks    Treatment/Interventions SLP instruction and feedback;Compensatory strategies;Patient/family education;Multimodal communcation approach;Cueing hierarchy;Language facilitation;Internal/external aids;Environmental controls;Diet toleration management by SLP;Aspiration precaution training;Other (comment)   MBSS as indicated   Potential to Achieve Goals Fair    Potential Considerations Severity of impairments;Previous level of function;Medical prognosis             Patient will benefit from skilled therapeutic intervention in order to improve the following deficits and impairments:   Dysphagia, oropharyngeal phase    Problem List Patient Active Problem List   Diagnosis Date Noted   PSP (progressive supranuclear palsy) (HCC) 02/12/2021    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HCC) 02/12/2021   Unable to ambulate 02/12/2020   Progressive supranuclear palsy (HCC) 10/12/2019   Encephalomalacia on imaging study 10/12/2019   Fluency disorder associated with underlying disease 02/08/2018   Cognitive and neurobehavioral dysfunction 02/08/2018   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HCC) 02/08/2018   Arthritis of carpometacarpal (CMC) joint of left thumb 01/24/2018   Ankle fracture, left 09/03/2016   Hand dysfunction 03/11/2016   Symptomatic varicose veins, bilateral 03/11/2016   Numbness of left thumb 03/11/2016   Elevated BP 07/27/2014   Bunion of left foot 07/26/2014   Abnormality of gait 07/26/2014   Visit for preventive health examination 07/28/2013   Right anterior knee pain 07/22/2012   Medicare annual wellness visit, subsequent 07/25/2011   ANXIETY, SITUATIONAL 09/25/2009   ARTHRITIS 09/25/2009   Vitamin D deficiency 09/27/2008   Hyperlipidemia 09/17/2008   Elevated blood pressure reading without diagnosis of hypertension 09/17/2008   COLONIC POLYPS, HX OF 09/17/2008   PERSONAL HISTORY DISEASES SKIN&SUBCUT TISSUE 09/17/2008    Camil Wilhelmsen, Radene Journey, CCC-SLP 04/03/2021, 2:29 PM  [facility] [address] [place], [place], [address] Phone: [phone]   Fax:  [phone]  Name: Pamela Alvarez MRN: [id number] Date of Birth: April 15, 1940

## 2021-04-03 NOTE — Therapy (Signed)
[facility] [address] [place], [place], [address] Phone: [phone]   Fax:  [phone]  Physical Therapy Evaluation  Patient Details  Name: Pamela Alvarez MRN: [id number] Date of Birth: 05-18-1940 Referring Provider (PT): Porfirio Mylar Dohmeier   Encounter Date: 04/02/2021   PT End of Session - 04/02/21 1059     Visit Number 1    Number of Visits 9   plus eval   Date for PT Re-Evaluation --   90 day recert, to capture w/c training that may be needed upon receipt of new power wheelchair   Authorization Type [employer] medicare- auth requested    Authorization - Visit Number --    Authorization - Number of Visits --    PT Start Time 1058    PT Stop Time 1135    PT Time Calculation (min) 37 min    Activity Tolerance Patient tolerated treatment well    Behavior During Therapy Flat affect;WFL for tasks assessed/performed   decreased safety awareness            Past Medical History:  Diagnosis Date   Cyst 07/2008   Cyst on back x2 that were drained   H/O echocardiogram 02/2000   Hx of colonic polyps    Hyperlipidemia    Hypertension    PSP (progressive supranuclear palsy) (HCC)    TBI (traumatic brain injury)    Varicose veins of both lower extremities     Past Surgical History:  Procedure Laterality Date   POLYPECTOMY     Colon   TONSILLECTOMY     TUBAL LIGATION      There were no vitals filed for this visit.    Subjective Assessment - 04/02/21 1100     Subjective Pt was diagnosed with PSP about 3 years ago. Husband also reports history of TBI as well that affected left side. Husband answering questions as pt mostly nonverbal. He reports a decline in her ability to walk but not sure that that can improve. He reports they are primarily concerned with left arm getting so tight. He wants to slow decline as much as possible mostly with arms. Husband reports they help her with all care. She is able to transfer with  husband or caregiver. Also stands with them. They would walk with pt holding on to her in home. Due to sundowning behaviors she was prescribed something for anxiety but husband found that he could not walk her when took the pills so is debating on if it is worth it.    Patient is accompained by: Family member   husband and caregiver, [name]   Patient Stated Goals To try to maintain mobility as much as possible.    Currently in Pain? --   unable to assess due to cognitive deficits. No overt signs of pain               [facility] PT Assessment - 04/02/21 1108       Assessment   Medical Diagnosis PSP    Referring Provider (PT) Porfirio Mylar Dohmeier    Onset Date/Surgical Date 03/13/21    Hand Dominance Left      Precautions   Precautions Fall      Balance Screen   Has the patient fallen in the past 6 months No    Has the patient had a decrease in activity level because of a fear of falling?  --   unable to ask due to cognitive status     Home  [employer] residence    Living Arrangements Spouse/significant other    Available Help at Discharge Family   has caregiver 40 hours/week, [name]   Type of Home House    Home Access Ramped entrance    Home Layout Two level;Able to live on main level with bedroom/bathroom    Alternate Level Stairs-Number of Steps 12    Alternate Level Stairs-Rails --   chair lift   Home Equipment Wheelchair - manual;Transport chair;Grab bars - tub/shower   rolling shower chair, lift chair but looking for another that would allow her to sleep as well.     Prior Function   Level of Independence Needs assistance with ADLs;Needs assistance with transfers      Cognition   Overall Cognitive Status History of cognitive impairments - at baseline   Pt is not consistent with yes/no. Followed single step commands 25% of time with demonstration helping.     Sensation   Light Touch Not tested    Additional Comments Unable to assess due to cognition       Posture/Postural Control   Posture Comments Pt with thoracic kyphosis. Left arm in flexed position.      Tone   Assessment Location Left Upper Extremity      ROM / Strength   AROM / PROM / Strength AROM;PROM      AROM   Overall AROM Comments Looked at functional task of having pt pick up water bottle bring to her mouth and hand back to PT. She was able to perform on right with difficulty following commands. Active shoulder flexion to around 70 degrees. Supine on mat pt able to bring knees up to chest to about 80 degrees of hip flexion and return to out straight.      PROM   Overall PROM Comments Passive right shoulder flexion to about 80 degrees. LUE limited by rigidity in all directions with limited motion.      Strength   Overall Strength Comments Unable to formally assess due to cognitive deficits. Pt was able to perform sit to stand with min/mod assist to come forward and maintain standing with hand held assist so LE strength grossly at least 3+/5.      Bed Mobility   Bed Mobility Supine to Sit;Sit to Supine    Supine to Sit Maximal Assistance - Patient - Patient 82-49%   husband again turned her at legs and then helped up at trunk   Sit to Supine Maximal Assistance - Patient 25-49%   pt leaned back and utilized her rigidity to stay stiff while husband turned her around at legs.     Transfers   Transfers Sit to Stand;Stand to [employer] Transfers    Sit to Stand 3: Mod assist    Sit to Stand Details Verbal cues for technique;Tactile cues for initiation;Verbal cues for precautions/safety    Sit to Stand Details (indicate cue type and reason) Pt leans posterior upon coming to stand. Needs assist to come forward. PT demonstrated with support behind shoulder blades. Husband showed how he performs and holds just behind elbows and leans back. Aide also demonstrated how she performs and she holds to pt's wrists and leans back. She reported that pt was pulling herself up. PT advised  to caution with pulling on arms as more proximal support is safer. Also discussed making sure pt's feet were under her and trying to get her to lean forward some first.    Stand to Sit  4: Min assist    Stand to Sit Details (indicate cue type and reason) Verbal cues for technique    Stand to Sit Details Verbal cues to back up to chair    Stand Pivot Transfers 3: Mod assist    Stand Pivot Transfer Details (indicate cue type and reason) with bilateral HHA to turn and verbal cues to move feet    Comments Pt stood with HHA min assist. Does tend to lean posterior.      Ambulation/Gait   Ambulation/Gait Yes    Ambulation/Gait Assistance 4: Min assist;3: Mod assist    Ambulation/Gait Assistance Details bilateral HHA with verbal cuing and assist to weight shift at arms    Ambulation Distance (Feet) 3 Feet    Assistive device 1 person hand held assist    Gait Pattern Decreased step length - right;Decreased step length - left;Step-to pattern    Ambulation Surface Level;Indoor      High Level Balance   High Level Balance Comments Pt tends to lean posterior. Did notice her correct this when sat edge of mat briefly to come forward      LUE Tone   LUE Tone Moderate;Hypertonic   rigidity                       Objective measurements completed on examination: See above findings.                PT Education - 04/03/21 [id number]     Education Details PT plan of care. Transfer safety to avoid pulling on arms    Person(s) Educated Patient;Spouse;Caregiver(s)    Methods Explanation;Demonstration    Comprehension Verbalized understanding;Need further instruction              PT Short Term Goals - 04/03/21 0837       PT SHORT TERM GOAL #1   Title STGs=LTGs               PT Long Term Goals - 04/03/21 0841       PT LONG TERM GOAL #1   Title Pt's caregivers will be instructed in proper transfer training techniques for improved safety and be able to demonstrate.     Time 4    Period Weeks    Status New    Target Date 05/02/21      PT LONG TERM GOAL #2   Title Pt will be able to walk 25' with bilateral HHA min assist of caregivers to continue to walk short household distances to maintain mobility.    Time 4    Period Weeks    Status New    Target Date 05/02/21      PT LONG TERM GOAL #3   Title Pt will be able to maintain standing x 2 min with RUE support on bar close supervision for improved ADLs.    Time 4    Period Weeks    Status New    Target Date 05/02/21                    Plan - 04/03/21 0813     Clinical Impression Statement Pt is 81 y/o female with history of PSP with cognitive deficits. Notes in chart and husband both report issues with sundowning. Pt is inconsistent with yes/no answers and mostly nonverbal. She follows commands about 25% of time and does better with demonstration/guidance in tasks. Unable to formally assess strength due to cognitive deficits but pt was  able to raise water bottle to chest height in with right arm and bring to her face as well as maintain standing min/mod assist for balance so able to move against gravity. She was mod/max assist with sit to stand transfer and will benefit from transfer training to help improve caregiver technique. Pt able to walk short distance with bilateral HHA min/mod assist. Pt will benefit from caregiver training to be able to better assist pt to maintain current mobility safely in home.    Personal Factors and Comorbidities Comorbidity 3+;Behavior Pattern;Time since onset of injury/illness/exacerbation;Age;Past/Current Experience    Comorbidities depression with anxiety, PSP diagnosed 3 years ago, hyperlipidemia    Examination-Activity Limitations Locomotion Level;Transfers;Bed Mobility;Stand    Examination-Participation Restrictions Community Activity;Interpersonal Relationship    Stability/Clinical Decision Making Evolving/Moderate complexity    Clinical Decision Making  Moderate    Rehab Potential Fair    PT Frequency 2x / week   plus eval   PT Duration 4 weeks    PT Treatment/Interventions ADLs/Self Care Home Management;Gait training;Functional mobility training;Therapeutic activities;Therapeutic exercise;Balance training;Neuromuscular re-education;Patient/family education;DME Instruction    PT Next Visit Plan Next visit focus on transfer training with caregiver education to avoid pulling on arms. Try to work on getting pt to initiate coming forward with feet back under her to help. Pt essentially nonverbal as inconsistent with yes/no. Able to follow very simple commands ~25% of the time. Focus on caregiver education to help maintain mobility for as long as possible.    Consulted and Agree with Plan of Care Patient;Family member/caregiver    Family Member Consulted Husband, ActorChuck and caregiver, [name]             Patient will benefit from skilled therapeutic intervention in order to improve the following deficits and impairments:  Abnormal gait, Decreased cognition, Decreased mobility, Decreased strength, Postural dysfunction, Decreased balance, Decreased knowledge of use of DME, Decreased range of motion  Visit Diagnosis: Other abnormalities of gait and mobility  Muscle weakness (generalized)  Unsteadiness on feet  Abnormal posture     Problem List Patient Active Problem List   Diagnosis Date Noted   PSP (progressive supranuclear palsy) (HCC) 02/12/2021   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HCC) 02/12/2021   Unable to ambulate 02/12/2020   Progressive supranuclear palsy (HCC) 10/12/2019   Encephalomalacia on imaging study 10/12/2019   Fluency disorder associated with underlying disease 02/08/2018   Cognitive and neurobehavioral dysfunction 02/08/2018   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HCC) 02/08/2018   Arthritis of carpometacarpal (CMC) joint of left thumb 01/24/2018   Ankle fracture, left 09/03/2016   Hand dysfunction 03/11/2016   Symptomatic varicose  veins, bilateral 03/11/2016   Numbness of left thumb 03/11/2016   Elevated BP 07/27/2014   Bunion of left foot 07/26/2014   Abnormality of gait 07/26/2014   Visit for preventive health examination 07/28/2013   Right anterior knee pain 07/22/2012   Medicare annual wellness visit, subsequent 07/25/2011   ANXIETY, SITUATIONAL 09/25/2009   ARTHRITIS 09/25/2009   Vitamin D deficiency 09/27/2008   Hyperlipidemia 09/17/2008   Elevated blood pressure reading without diagnosis of hypertension 09/17/2008   COLONIC POLYPS, HX OF 09/17/2008   PERSONAL HISTORY DISEASES SKIN&SUBCUT TISSUE 09/17/2008    Ronn MelenaEmily A Burna Atlas, PT, DPT,NCS 04/03/2021, 8:44 AM  [facility] [address] [place], [place], [address] Phone: [phone]   Fax:  [phone]  Name: Lorie ApleySandra Stuller MRN: [id number] Date of Birth: 12/26/1940

## 2021-04-07 ENCOUNTER — Ambulatory Visit: Payer: Medicare PPO | Admitting: Physical Therapy

## 2021-04-07 ENCOUNTER — Ambulatory Visit: Payer: Medicare PPO | Admitting: Speech Pathology

## 2021-04-07 ENCOUNTER — Encounter: Payer: Self-pay | Admitting: Speech Pathology

## 2021-04-07 ENCOUNTER — Ambulatory Visit: Payer: Medicare PPO | Admitting: Occupational Therapy

## 2021-04-07 ENCOUNTER — Encounter: Payer: Self-pay | Admitting: Occupational Therapy

## 2021-04-07 ENCOUNTER — Other Ambulatory Visit: Payer: Self-pay

## 2021-04-07 ENCOUNTER — Encounter: Payer: Self-pay | Admitting: Physical Therapy

## 2021-04-07 DIAGNOSIS — R41840 Attention and concentration deficit: Secondary | ICD-10-CM

## 2021-04-07 DIAGNOSIS — R41842 Visuospatial deficit: Secondary | ICD-10-CM

## 2021-04-07 DIAGNOSIS — R2689 Other abnormalities of gait and mobility: Secondary | ICD-10-CM | POA: Diagnosis not present

## 2021-04-07 DIAGNOSIS — R2681 Unsteadiness on feet: Secondary | ICD-10-CM | POA: Diagnosis not present

## 2021-04-07 DIAGNOSIS — R1312 Dysphagia, oropharyngeal phase: Secondary | ICD-10-CM

## 2021-04-07 DIAGNOSIS — R41844 Frontal lobe and executive function deficit: Secondary | ICD-10-CM

## 2021-04-07 DIAGNOSIS — R29818 Other symptoms and signs involving the nervous system: Secondary | ICD-10-CM

## 2021-04-07 DIAGNOSIS — R278 Other lack of coordination: Secondary | ICD-10-CM | POA: Diagnosis not present

## 2021-04-07 DIAGNOSIS — R293 Abnormal posture: Secondary | ICD-10-CM

## 2021-04-07 DIAGNOSIS — M6281 Muscle weakness (generalized): Secondary | ICD-10-CM | POA: Diagnosis not present

## 2021-04-07 NOTE — Patient Instructions (Signed)
° °  When we think of aspiration pneumonia there are several risk factors:  Having cognitive decline  Being a dependent feeder  Being bed bound  Poor oral care  Having swallowing difficulty  Encourage finger foods when you can to promote self feeding  Make sure she has an empty mouth before you give her a sip or another bite  Stay upright 30 minutes after meals if you are able  Consider softer foods   Consider protein shakes to supplement meals if she is having a hard day  Oral care is important to keep bacterial down in the mouth  Wait for her to finish coughing before offering another bite or sip  Reduce distractions with meals, such as TV or conversations, if it is distracting to her  Drool and choking on saliva may be a result of not swallowing frequently enough due to reduced sensation for need to swallow  Consider avoiding "mixed consistencies" that contain a liquid and solid, such as soups, cereals, canned fruit cocktail. This can be hard to manage both consistencies. The liquid can spill into your throat or airway before she swallows her liquids.   One pill at a time - or put in pudding, yogurt or applesauce   Great job having Belle's lungs listened to regularly -   If she becomes sick with a cold, flu etc, be diligent about keeping her mouth clean and check her lungs frequently  Walking as much as she can also helps keep her lungs clear and reduces risk of pneumonia  Signs of Aspiration Pneumonia   Chest pain/tightness Fever (can be low grade) Cough  With foul-smelling phlegm (sputum) With sputum containing pus or blood With greenish sputum Fatigue  Shortness of breath  Wheezing   **IF YOU HAVE THESE SIGNS, CONTACT YOUR DOCTOR OR GO TO THE EMERGENCY DEPARTMENT OR URGENT CARE AS SOON AS POSSIBLE**

## 2021-04-07 NOTE — Therapy (Signed)
[facility] [address] [place], [place], [address] Phone: [phone]   Fax:  [phone]  Physical Therapy Treatment  Patient Details  Name: Pamela Alvarez MRN: [id number] Date of Birth: 12/03/1940 Referring Provider (PT): Asencion Partridge Dohmeier   Encounter Date: 04/07/2021   PT End of Session - 04/07/21 1319     Visit Number 2    Number of Visits 9   plus eval   Authorization Type [employer] medicare- auth requested    PT Start Time 1316    PT Stop Time 1356    PT Time Calculation (min) 40 min    Equipment Utilized During Treatment Gait belt    Activity Tolerance Patient tolerated treatment well;Patient limited by fatigue    Behavior During Therapy Flat affect;WFL for tasks assessed/performed   decreased safety awareness            Past Medical History:  Diagnosis Date   Cyst 07/2008   Cyst on back x2 that were drained   H/O echocardiogram 02/2000   Hx of colonic polyps    Hyperlipidemia    Hypertension    PSP (progressive supranuclear palsy) ([place])    TBI (traumatic brain injury)    Varicose veins of both lower extremities     Past Surgical History:  Procedure Laterality Date   POLYPECTOMY     Colon   TONSILLECTOMY     TUBAL LIGATION      There were no vitals filed for this visit.   Subjective Assessment - 04/07/21 1319     Subjective Pt's husband using a rolling shower chair to push patient around at home. Trying to do some more walking inside the house. Not doing anything for stretching or exercise right now. No falls since she was last here.    Patient is accompained by: Family member   husband and caregiver, [name]   Patient Stated Goals To try to maintain mobility as much as possible.    Currently in Pain? No/denies                               [facility] Adult PT Treatment/Exercise - 04/07/21 1359       Bed Mobility   Supine to Sit Maximal Assistance - Patient - Patient 25-49%    PT helped at trunk and husband helped bring legs off mat.   Sit to Supine Maximal Assistance - Patient 25-49%   pt leaned back and husband helped turn pt's at legs, therapist assisting place     Transfers   Transfers Sit to Stand;Stand to Sit;Stand Pivot Transfers    Sit to Stand 4: Min assist;3: Mod assist    Sit to Stand Details Verbal cues for technique;Tactile cues for initiation;Verbal cues for precautions/safety    Sit to Stand Details (indicate cue type and reason) Approx. 10 reps throughout session. PT showed how to better perform sit <> stands at home. Pt's husband pulls from elbows and leans back.  Cued first to make sure pt's feet are tucked back under her. If pt's husband needs to stand anteriorly, showed how to provide support behind shoulder blades and single HHA and then gently leaning forward to stand. pt able to demonstrate activation through BLE to help stand.  Also showed how to perform laterally, standing on pt's side and gently having single HHA and then using arm around pt's back to help get incr forward weight shift to stand first. Pt's  spouse reporting incr ease of transfer performing this way and also demonstrating improved body mechanics.    Stand to Sit 4: Min assist    Stand to Sit Details (indicate cue type and reason) Verbal cues for technique    Stand Pivot Transfers 3: Mod assist    Stand Pivot Transfer Details (indicate cue type and reason) Pt's husband demonstrated how they perform at home with bilat HHA with cues to move feet when stepping    Comments Stood approx. 8 reps throughout session for ~10 seconds each. Pt with BLE against mat when standing and forward flexed posture. Needing assist to control descent to prevent posterior weight shift.      Ambulation/Gait   Gait Comments Did not perform gait today due to pt too fatigued.      High Level Balance   High Level Balance Comments Pt with posterior lean in sitting. Needs intermittent assist in sitting to  maintain neutral sitting. Did help when sitting at edge of mat putting aerobic step under pt's feet.      Exercises   Exercises Other Exercises    Other Exercises  Gentle ankle DF stretch 2 x 30 seconds each leg. Pt's husband reports that they also have a seated peddle bike at home that they are going to try to use more consistently      Knee/Hip Exercises: Stretches   Passive Hamstring Stretch Both;2 reps;30 seconds;Limitations   supine on mat table   Other Knee/Hip Stretches Single knee to chest stretch 3 x 30 seconds each leg. Incr tightness with LLE      Knee/Hip Exercises: Supine   Bridges AAROM;Strengthening;Both;2 sets;5 reps    Bridges Limitations Verbal/tactile cues to lift bottom with pt demonstrating activation of glutes and able to slightly lift bottom off of mat table                     PT Education - 04/07/21 1419     Education Details Sit to stands for improved body mechanics/avoiding pulling on pt's arms. Initial stretching/ROM HEP for pt to perform with caregiver.    Person(s) Educated Patient;Caregiver(s)    Methods Explanation;Demonstration;Verbal cues;Handout    Comprehension Verbalized understanding;Returned demonstration;Need further instruction              PT Short Term Goals - 04/03/21 0837       PT SHORT TERM GOAL #1   Title STGs=LTGs               PT Long Term Goals - 04/03/21 0841       PT LONG TERM GOAL #1   Title Pt's caregivers will be instructed in proper transfer training techniques for improved safety and be able to demonstrate.    Time 4    Period Weeks    Status New    Target Date 05/02/21      PT LONG TERM GOAL #2   Title Pt will be able to walk 25' with bilateral HHA min assist of caregivers to continue to walk short household distances to maintain mobility.    Time 4    Period Weeks    Status New    Target Date 05/02/21      PT LONG TERM GOAL #3   Title Pt will be able to maintain standing x 2 min with RUE  support on bar close supervision for improved ADLs.    Time 4    Period Weeks    Status New  Target Date 05/02/21                   Plan - 04/07/21 1422     Clinical Impression Statement Initiated stretching/ROM HEP for caregiver to perform with pt at home. Incr tightness with LLE. Pt needing min/mod A today with sit <> stand transfers. Worked with transfer technique with pt's husband (either standing laterally or anteriorly) to assist with forward weight shift and to avoid pulling from pt's arms to help stand. Pt able to demonstrate activation of BLE to help stand. Pt's husband also reports incr ease of transfer when helping her to gently forward weight shift before standing.  Unable to ambulate today due to pt being too fatigued. Will continue to progress towards LTGs.    Personal Factors and Comorbidities Comorbidity 3+;Behavior Pattern;Time since onset of injury/illness/exacerbation;Age;Past/Current Experience    Comorbidities depression with anxiety, PSP diagnosed 3 years ago, hyperlipidemia    Examination-Activity Limitations Locomotion Level;Transfers;Bed Mobility;Stand    Examination-Participation Restrictions Community Activity;Interpersonal Relationship    Stability/Clinical Decision Making Evolving/Moderate complexity    Rehab Potential Fair    PT Frequency 2x / week   plus eval   PT Duration 4 weeks    PT Treatment/Interventions ADLs/Self Care Home Management;Gait training;Functional mobility training;Therapeutic activities;Therapeutic exercise;Balance training;Neuromuscular re-education;Patient/family education;DME Instruction    PT Next Visit Plan Review sit <> stand transfers- working on getting forward weight shift first. Try to work on getting pt to initiate coming forward with feet back under her to help. Pt essentially nonverbal as inconsistent with yes/no. Able to follow very simple commands ~25% of the time. Focus on caregiver education to help maintain mobility  for as long as possible. Standing tolerance. Gait with HHA when able.    Consulted and Agree with Plan of Care Patient;Family member/caregiver    Family Member Consulted Husband, Harrie Jeans and caregiver, Casa             Patient will benefit from skilled therapeutic intervention in order to improve the following deficits and impairments:  Abnormal gait, Decreased cognition, Decreased mobility, Decreased strength, Postural dysfunction, Decreased balance, Decreased knowledge of use of DME, Decreased range of motion  Visit Diagnosis: Other symptoms and signs involving the nervous system  Other lack of coordination  Abnormal posture     Problem List Patient Active Problem List   Diagnosis Date Noted   PSP (progressive supranuclear palsy) ([place]) 02/12/2021   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[place]) 02/12/2021   Unable to ambulate 02/12/2020   Progressive supranuclear palsy ([place]) 10/12/2019   Encephalomalacia on imaging study 10/12/2019   Fluency disorder associated with underlying disease 02/08/2018   Cognitive and neurobehavioral dysfunction 02/08/2018   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[place]) 02/08/2018   Arthritis of carpometacarpal (CMC) joint of left thumb 01/24/2018   Ankle fracture, left 09/03/2016   Hand dysfunction 03/11/2016   Symptomatic varicose veins, bilateral 03/11/2016   Numbness of left thumb 03/11/2016   Elevated BP 07/27/2014   Bunion of left foot 07/26/2014   Abnormality of gait 07/26/2014   Visit for preventive health examination 07/28/2013   Right anterior knee pain 07/22/2012   Medicare annual wellness visit, subsequent 07/25/2011   ANXIETY, SITUATIONAL 09/25/2009   ARTHRITIS 09/25/2009   Vitamin D deficiency 09/27/2008   Hyperlipidemia 09/17/2008   Elevated blood pressure reading without diagnosis of hypertension 09/17/2008   COLONIC POLYPS, HX OF 09/17/2008   PERSONAL HISTORY DISEASES SKIN&SUBCUT TISSUE 09/17/2008    Arliss Journey, PT, DPT  04/07/2021, 2:25  PM  [facility] [address] [place], [place], [address] Phone: [phone]   Fax:  [phone]  Name: Pamela Alvarez MRN: [id number] Date of Birth: 04/18/1940

## 2021-04-07 NOTE — Therapy (Signed)
[facility] [address] [place], [place], [address] Phone: [phone]   Fax:  [phone]  Occupational Therapy Treatment  Patient Details  Name: Pamela Alvarez MRN: [id number] Date of Birth: 12/15/1940 Referring Provider (OT): Dr. Vickey Hugerohmeier   Encounter Date: 04/07/2021   OT End of Session - 04/07/21 1231     Visit Number 2    Number of Visits 11   anticipate pt will only need 3-6 visits   Date for OT Re-Evaluation 05/07/21    Authorization Type [employer]    Authorization Time Period 5 weeks    Authorization - Visit Number 2    Authorization - Number of Visits 10    Progress Note Due on Visit 10    OT Start Time 1233    OT Stop Time 1313    OT Time Calculation (min) 40 min    Activity Tolerance Patient tolerated treatment well    Behavior During Therapy Flat affect;WFL for tasks assessed/performed             Past Medical History:  Diagnosis Date   Cyst 07/2008   Cyst on back x2 that were drained   H/O echocardiogram 02/2000   Hx of colonic polyps    Hyperlipidemia    Hypertension    PSP (progressive supranuclear palsy) (HCC)    TBI (traumatic brain injury)    Varicose veins of both lower extremities     Past Surgical History:  Procedure Laterality Date   POLYPECTOMY     Colon   TONSILLECTOMY     TUBAL LIGATION      There were no vitals filed for this visit.   Subjective Assessment - 04/07/21 1442     Subjective  "yes" doing ok "no" in regards to pain    Pertinent History PSP    Limitations do not charge orthotic fit it is not [facility]    Patient Stated Goals cargeiver ed for stretching    Currently in Pain? No/denies              In supine, performed PROM/AAROM to LUE and AAROM/AROM to RUE while providing education to husband on technique for ROM.             OT Education - 04/07/21 1443     Education Details HEP for stretching BUEs (RUE AAROM and LUE  PROM/AAROM).  Also discussed listening to audiobooks, music, etc during stretching to help relax pt.    Person(s) Educated Patient;Spouse    Methods Explanation;Demonstration;Tactile cues;Verbal cues;Handout    Comprehension Verbalized understanding;Verbal cues required;Returned demonstration;Need further instruction;Tactile cues required              OT Short Term Goals - 04/02/21 1032       OT SHORT TERM GOAL #1   Title no short term goals               OT Long Term Goals - 04/02/21 1028       OT LONG TERM GOAL #1   Title Pt's caregiver's will be I with HEP for UE ROM and  UE  functional use prn for increased ease with ADLs and decreasing caregiver burden    Time 5    Period Weeks    Target Date 05/07/21      OT LONG TERM GOAL #2   Title Pt will tolerate 70* P/ROM  shoulder flexion, with no significant reports of pain  for increased ease with ADLs.  Baseline RUE grossly 60 shoulder flexion P/ROM with reports of discomfort    Time 5    Period Weeks    Status New                   Plan - 04/07/21 1231     Clinical Impression Statement Husband verbalized understanding of initial HEP for ROM and pt tolerated ROM well today.    OT Occupational Profile and History Detailed Assessment- Review of Records and additional review of physical, cognitive, psychosocial history related to current functional performance    Occupational performance deficits (Please refer to evaluation for details): ADL's;IADL's;Leisure;Social Participation    Body Structure / Function / Physical Skills ADL;Balance;Mobility;Strength;UE functional use;Tone;FMC;Flexibility;Coordination;Gait;Vision;Sensation;IADL;Dexterity;Decreased knowledge of use of DME;GMC;Decreased knowledge of precautions    Cognitive Skills Attention;Problem Solve;Safety Awareness;Sequencing;Thought;Understand    Rehab Potential Fair    Clinical Decision Making Several treatment options, min-mod task modification  necessary    Comorbidities Affecting Occupational Performance: May have comorbidities impacting occupational performance    Modification or Assistance to Complete Evaluation  Min-Moderate modification of tasks or assist with assess necessary to complete eval    OT Frequency 2x / week    OT Duration --   5 weeks   OT Treatment/Interventions Self-care/ADL training;Therapeutic exercise;Splinting;Manual Therapy;Neuromuscular education;Ultrasound;Aquatic Therapy;Therapeutic activities;Paraffin;DME and/or AE instruction;Cognitive remediation/compensation;Visual/perceptual remediation/compensation;Gait Training;Fluidtherapy;Electrical Stimulation;Moist Heat;Contrast Bath;Passive range of motion;Patient/family education    Plan review HEP (aide not present today); UE functional use as able    Consulted and Agree with Plan of Care Patient;Family member/caregiver   husband and caregiver [name]- caregiver is with pt 40 hs per week            Patient will benefit from skilled therapeutic intervention in order to improve the following deficits and impairments:   Body Structure / Function / Physical Skills: ADL, Balance, Mobility, Strength, UE functional use, Tone, FMC, Flexibility, Coordination, Gait, Vision, Sensation, IADL, Dexterity, Decreased knowledge of use of DME, GMC, Decreased knowledge of precautions Cognitive Skills: Attention, Problem Solve, Safety Awareness, Sequencing, Thought, Understand     Visit Diagnosis: Other symptoms and signs involving the nervous system  Attention and concentration deficit  Frontal lobe and executive function deficit  Other lack of coordination  Visuospatial deficit  Abnormal posture    Problem List Patient Active Problem List   Diagnosis Date Noted   PSP (progressive supranuclear palsy) (HCC) 02/12/2021   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HCC) 02/12/2021   Unable to ambulate 02/12/2020   Progressive supranuclear palsy (HCC) 10/12/2019   Encephalomalacia on  imaging study 10/12/2019   Fluency disorder associated with underlying disease 02/08/2018   Cognitive and neurobehavioral dysfunction 02/08/2018   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HCC) 02/08/2018   Arthritis of carpometacarpal (CMC) joint of left thumb 01/24/2018   Ankle fracture, left 09/03/2016   Hand dysfunction 03/11/2016   Symptomatic varicose veins, bilateral 03/11/2016   Numbness of left thumb 03/11/2016   Elevated BP 07/27/2014   Bunion of left foot 07/26/2014   Abnormality of gait 07/26/2014   Visit for preventive health examination 07/28/2013   Right anterior knee pain 07/22/2012   Medicare annual wellness visit, subsequent 07/25/2011   ANXIETY, SITUATIONAL 09/25/2009   ARTHRITIS 09/25/2009   Vitamin D deficiency 09/27/2008   Hyperlipidemia 09/17/2008   Elevated blood pressure reading without diagnosis of hypertension 09/17/2008   COLONIC POLYPS, HX OF 09/17/2008   PERSONAL HISTORY DISEASES SKIN&SUBCUT TISSUE 09/17/2008    Willa Frater, OT 04/07/2021, 2:45 PM  [facility] [address]  [place], [place], [address] Phone: [phone]   Fax:  [phone]  Name: Kerissa Coia MRN: [id number] Date of Birth: 04/15/1940  Willa Frater, OTR/L [facility] [address] [place], [place]  [phone] phone [phone] 04/07/21 2:45 PM

## 2021-04-07 NOTE — Patient Instructions (Signed)
Access Code: [email or web address] URL: [email or web address] Date: 04/07/2021 Prepared by: Sherlie Ban  Exercises Hip and Knee Extension and Flexion Caregiver PROM - 2 x daily - 5 x weekly - 3 sets - 30 hold Foot Dorsiflexion PROM Caregiver - 2 x daily - 5 x weekly - 3 sets - 20 hold Supine Hamstring Stretch with Caregiver - 2 x daily - 5 x weekly - 3 sets - 30 hold Supine Bridge - 2 x daily - 5 x weekly - 2 sets - 5 reps

## 2021-04-07 NOTE — Patient Instructions (Addendum)
° °  Tips for Range of Motion  Set aside same time of day for exercise, so it becomes part of the daily routine. All hand holds should be gentle. Move slowly, cautiously and with good control and watch for signs of pain.  Allow her to help as needed. Once resistance is felt or she reports pain, push no further.  Hold as tolerated for 10-30sec  (start with 10sec and gradually increase hold time) Quality of stretch is more important than quantity.--check positioning Be consistent with program to ensure results. Try to make exercise fun -- talk, play music, watch tv to distract IF NEEDED  **Do all with both arms.  Let Halo help some with Left arm.  Let Rael do most of the movement with right arm (guide movement)   Flexion: ROM (Lying down)    Position (A) Helper: Hold left arm close to side of trunk. Motion (B) - Lift arm up with thumb facing up (support arm under elbow and keep elbow tucked in close to side). CAUTION: Do not push into shoulder joint. Do not force movement if painful.   Repeat 10 times.  Do 1 sessions per day.    Abduction: ROM-- Lying down       Laying down or reclined, Position (A) Helper: Bend your knees to reach for left arm. Motion - Keep thumb pointed in direction of movement. - Lift arm out to side. (B) - Raise above head to complete an arc. (C) Repeat 10 times. Repeat with other arm. Do 10sessions per day. CAUTION: Do not force movement if painful. Do not push into shoulder joint.     External Rotation: ROM (Lying down)     Position (A) Helper: Hold left arm close to side of trunk, elbow bent. Motion (B) -Bring hand out to side. -Elbow remains in place, wrist straight. CAUTION: Stop at point of tension in muscle or joint. Repeat 10 times. . Do 1 sessions per day. Place hand under shoulder (keep upper arm in line with body, move slowly)    Extension: Stretch - Forearm Extensors (Lying down)    Position Helper: Support left arm at elbow. Motion -Helper  straightens elbow fully. CAUTION: Do not force movement if painful. Hold 10-60 seconds as tolerated. Repeat 10 times. Do 1 sessions per day. **Make sure shoulder is low and thumb is up as able.  Do not bend wrist with elbow   Extension: ROM    Position (A) Helper: Stabilize left forearm. Grasp hand under palm. Motion (B) -Lift hand back, bending at wrist.   CAUTION: Do not push into wrist joint. Repeat 10 times.  Do 1 sessions per day.     Supination: ROM (Sitting)    Position (A)  Hold arm at side with thumb up. Motion (B) -Turn forearm so palm faces up. CAUTION: Keep wrist straight as possible. Hold 10-60 seconds. Then turn palm down.  Repeat 10 times.  Do 1 sessions per day.    SELF ASSISTED: Finger Extension    Use one hand to open fingers on other hand. 10 reps per set, 1 time per day      MP / PIP / DIP Composite Flexion (Passive Stretch)    Use other hand to bend all fingers at all three joints.  Repeat 10 times. Do 1 sessions per day.

## 2021-04-07 NOTE — Therapy (Signed)
[facility] [address] [place], [place], [address] Phone: [phone]   Fax:  [phone]  Speech Language Pathology Treatment  Patient Details  Name: Pamela Alvarez MRN: [id number] Date of Birth: Jul 11, 1940 Referring Provider (SLP): Dohmeier, Porfirio Mylar, MD   Encounter Date: 04/07/2021   End of Session - 04/07/21 1208     Visit Number 2    Number of Visits 17    Date for SLP Re-Evaluation 05/30/21    Authorization Type [employer]    SLP Start Time 1100    SLP Stop Time  1142    SLP Time Calculation (min) 42 min    Activity Tolerance Patient tolerated treatment well             Past Medical History:  Diagnosis Date   Cyst 07/2008   Cyst on back x2 that were drained   H/O echocardiogram 02/2000   Hx of colonic polyps    Hyperlipidemia    Hypertension    PSP (progressive supranuclear palsy) (HCC)    TBI (traumatic brain injury)    Varicose veins of both lower extremities     Past Surgical History:  Procedure Laterality Date   POLYPECTOMY     Colon   TONSILLECTOMY     TUBAL LIGATION      There were no vitals filed for this visit.   Subjective Assessment - 04/07/21 1110     Subjective "I can't do the Lorazapam, it makes it too hard to get her up and around"    Currently in Pain? --   no overt signs of pain                  ADULT SLP TREATMENT - 04/07/21 1111       General Information   Behavior/Cognition Alert;Cooperative;Requires cueing      Treatment Provided   Treatment provided Dysphagia      Dysphagia Treatment   Temperature Spikes Noted No    Respiratory Status Room air    Oral Cavity - Dentition Adequate natural dentition    Treatment Methods Skilled observation;Compensation strategy training;Patient/caregiver education    Patient observed directly with PO's Yes    Type of PO's observed Thin liquids    Feeding Needs assist;Able to feed self    Liquids provided via  Straw    Pharyngeal Phase Signs & Symptoms Delayed throat clear    Type of cueing Verbal    Amount of cueing Minimal    Other treatment/comments See skilled treatment section      Cognitive-Linquistic Treatment   Treatment focused on Patient/family/caregiver education    Skilled Treatment Educated sposue who is primary caregiver on risk factors for apsiration pna, including cognitive impairment, being a dependent feeder, reduced mobility or being bed bound and poor oral care. Encouraged him to ambulate Nahima as she is able to help with lung health. Instructed spouse that she is likely having aspiration episodes both sensed and unsensed. due to advanced PSP. Madelynn took 2 trials of successive liquid sips without overt s/s of aspsiration. She refused further trials. Educated spouse on general diet modifications, such as including finger foods to encourage self feeding, avoiding mixed consistencies and foods that break apart such as rice or cornbread and adding protein shakes if she has some days with limited PO intake. At this time, they are eating at a table with no distractions.      Assessment / Recommendations / Plan   Plan Continue with current plan  of care      Dysphagia Recommendations   Diet recommendations Dysphagia 3 (mechanical soft);Thin liquid    Liquids provided via Straw    Medication Administration Whole meds with puree    Supervision Patient able to self feed;Trained caregiver to feed patient   ability varies due to advanced PSP   Compensations Slow rate;Small sips/bites;Check for pocketing    Postural Changes and/or Swallow Maneuvers Out of bed for meals;Upright 30-60 min after meal      Progression Toward Goals   Progression toward goals Progressing toward goals              SLP Education - 04/07/21 1200     Education Details general diet modifications, general swallow precautions, s/s of aspriation pna, walk as tolerated for lung health, oral care    Person(s)  Educated Patient;Spouse    Methods Explanation;Demonstration;Verbal cues;Handout    Comprehension Verbalized understanding;Verbal cues required;Returned demonstration              SLP Short Term Goals - 04/07/21 1207       SLP SHORT TERM GOAL #1   Title Family and caregiver will carryover 2 swallow precautions when feeding pt with occasional min A    Time 4    Period Weeks    Status On-going      SLP SHORT TERM GOAL #2   Title Family/caregiver will follow 2 diet modficiations for meals and meds to reduce risk of aspiration    Time 4    Period Weeks    Status On-going      SLP SHORT TERM GOAL #3   Title Pt and caregiver will verbalize s/s of aspiration pna with mod I    Time 4    Period Weeks    Status On-going      SLP SHORT TERM GOAL #4   Title Pt will complete MBSS if indicated    Time 4    Period Weeks    Status On-going              SLP Long Term Goals - 04/07/21 1208       SLP LONG TERM GOAL #1   Title Pt/caregiver will follow swallow precautions with mod I    Time 8    Period Weeks    Status On-going      SLP LONG TERM GOAL #2   Title Caregivers will follow diet modifications with mod I    Time 8    Period Weeks    Status On-going              Plan - 04/07/21 1203     Clinical Impression Statement [name] is referred for outpt ST due to some swallowing concerns from sposue. She has coughing after thin liquids inconsistently, as well as coughing without PO, likely due to reduced sensation/saliva pooling in pharynx. Spouse endorses drool as well. Ongoing education re: general diet modifications and swallow precautions and enviornmental and physical wasy to reduce risk of aspiration pna. Educated spouse that pt is likely aspirating due to advanced PSP. At this time, she refuses multiple trials of PO in ST and does not follow commands. ? if she is a candiated for MBSS. She has refused thickened liquids in the past (spouse intitated this on his  own). Continue short course of ST to educated and train family and caregivers on strategies to maximize safety of swallow and reduce risk of aspiration pna.    Speech Therapy Frequency 2x /  week    Duration 8 weeks    Treatment/Interventions SLP instruction and feedback;Compensatory strategies;Patient/family education;Multimodal communcation approach;Cueing hierarchy;Language facilitation;Internal/external aids;Environmental controls;Diet toleration management by SLP;Aspiration precaution training;Other (comment)    Potential to Achieve Goals Fair    Potential Considerations Severity of impairments;Previous level of function;Medical prognosis             Patient will benefit from skilled therapeutic intervention in order to improve the following deficits and impairments:   Dysphagia, oropharyngeal phase    Problem List Patient Active Problem List   Diagnosis Date Noted   PSP (progressive supranuclear palsy) (HCC) 02/12/2021   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HCC) 02/12/2021   Unable to ambulate 02/12/2020   Progressive supranuclear palsy (HCC) 10/12/2019   Encephalomalacia on imaging study 10/12/2019   Fluency disorder associated with underlying disease 02/08/2018   Cognitive and neurobehavioral dysfunction 02/08/2018   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HCC) 02/08/2018   Arthritis of carpometacarpal (CMC) joint of left thumb 01/24/2018   Ankle fracture, left 09/03/2016   Hand dysfunction 03/11/2016   Symptomatic varicose veins, bilateral 03/11/2016   Numbness of left thumb 03/11/2016   Elevated BP 07/27/2014   Bunion of left foot 07/26/2014   Abnormality of gait 07/26/2014   Visit for preventive health examination 07/28/2013   Right anterior knee pain 07/22/2012   Medicare annual wellness visit, subsequent 07/25/2011   ANXIETY, SITUATIONAL 09/25/2009   ARTHRITIS 09/25/2009   Vitamin D deficiency 09/27/2008   Hyperlipidemia 09/17/2008   Elevated blood pressure reading without diagnosis of  hypertension 09/17/2008   COLONIC POLYPS, HX OF 09/17/2008   PERSONAL HISTORY DISEASES SKIN&SUBCUT TISSUE 09/17/2008    Travarius Lange, Radene JourneyLaura Ann, CCC-SLP 04/07/2021, 12:09 PM  [facility] [address] [place], [place], [address] Phone: [phone]   Fax:  [phone]   Name: Lorie ApleySandra Hepburn MRN: [id number] Date of Birth: 06/10/1940

## 2021-04-08 DIAGNOSIS — E785 Hyperlipidemia, unspecified: Secondary | ICD-10-CM | POA: Diagnosis not present

## 2021-04-08 DIAGNOSIS — I10 Essential (primary) hypertension: Secondary | ICD-10-CM

## 2021-04-10 ENCOUNTER — Ambulatory Visit: Payer: Medicare PPO | Admitting: Speech Pathology

## 2021-04-10 ENCOUNTER — Ambulatory Visit: Payer: Medicare PPO | Attending: Internal Medicine

## 2021-04-10 ENCOUNTER — Other Ambulatory Visit: Payer: Self-pay

## 2021-04-10 ENCOUNTER — Other Ambulatory Visit: Payer: Self-pay | Admitting: Internal Medicine

## 2021-04-10 DIAGNOSIS — R1312 Dysphagia, oropharyngeal phase: Secondary | ICD-10-CM | POA: Insufficient documentation

## 2021-04-10 DIAGNOSIS — M6281 Muscle weakness (generalized): Secondary | ICD-10-CM | POA: Diagnosis not present

## 2021-04-10 DIAGNOSIS — R41844 Frontal lobe and executive function deficit: Secondary | ICD-10-CM | POA: Diagnosis not present

## 2021-04-10 DIAGNOSIS — R41841 Cognitive communication deficit: Secondary | ICD-10-CM | POA: Diagnosis present

## 2021-04-10 DIAGNOSIS — R29818 Other symptoms and signs involving the nervous system: Secondary | ICD-10-CM | POA: Diagnosis not present

## 2021-04-10 DIAGNOSIS — R41842 Visuospatial deficit: Secondary | ICD-10-CM | POA: Insufficient documentation

## 2021-04-10 DIAGNOSIS — R2689 Other abnormalities of gait and mobility: Secondary | ICD-10-CM | POA: Diagnosis not present

## 2021-04-10 DIAGNOSIS — R293 Abnormal posture: Secondary | ICD-10-CM | POA: Diagnosis not present

## 2021-04-10 DIAGNOSIS — R278 Other lack of coordination: Secondary | ICD-10-CM | POA: Diagnosis not present

## 2021-04-10 DIAGNOSIS — R41840 Attention and concentration deficit: Secondary | ICD-10-CM | POA: Insufficient documentation

## 2021-04-10 NOTE — Therapy (Signed)
[facility] [address] [place], [place], [address] Phone: [phone]   Fax:  [phone]  Speech Language Pathology Treatment  Patient Details  Name: Pamela Alvarez MRN: [id number] Date of Birth: Sep 19, 1940 Referring Provider (SLP): Dohmeier, Porfirio Mylar, MD   Encounter Date: 04/10/2021   End of Session - 04/10/21 1456     Visit Number 3    Number of Visits 17    Date for SLP Re-Evaluation 05/30/21    Authorization Type [employer]    SLP Start Time 1230    SLP Stop Time  1312    SLP Time Calculation (min) 42 min    Activity Tolerance Patient tolerated treatment well;Patient limited by fatigue             Past Medical History:  Diagnosis Date   Cyst 07/2008   Cyst on back x2 that were drained   H/O echocardiogram 02/2000   Hx of colonic polyps    Hyperlipidemia    Hypertension    PSP (progressive supranuclear palsy) (HCC)    TBI (traumatic brain injury)    Varicose veins of both lower extremities     Past Surgical History:  Procedure Laterality Date   POLYPECTOMY     Colon   TONSILLECTOMY     TUBAL LIGATION      There were no vitals filed for this visit.          ADULT SLP TREATMENT - 04/10/21 1446       General Information   Behavior/Cognition Alert;Cooperative;Requires cueing      Treatment Provided   Treatment provided Dysphagia      Dysphagia Treatment   Temperature Spikes Noted No    Respiratory Status Room air    Oral Cavity - Dentition Adequate natural dentition    Treatment Methods Skilled observation;Compensation strategy training;Patient/caregiver education    Patient observed directly with PO's Yes    Type of PO's observed Dysphagia 3 (soft);Thin liquids    Feeding Able to feed self;Needs set up;Needs assist    Liquids provided via Straw    Pharyngeal Phase Signs & Symptoms Wet vocal quality;Immediate cough   cough x1 out of 4 trials of successive sips, Wet voice x1  with soft solid   Type of cueing Verbal    Amount of cueing Minimal    Other treatment/comments PO trials of cereal bar and water. Pamela Alvarez self fed with appropriate rate and bites with min A to place food in her grasp. Personal care attendant , Sheilah Pigeon and spouse educated in benefits of Jerre self feeding to reduce risk of aspiration. They have carried over not offering liquids until coughing as ceased. Today, as pt had wet voice, I pointed this out and they did hear it. Trained Sheilah Pigeon and Morris in oral care TID to reduce risk of pna, as they are able. Again reiterated that due to advanced PSP senses and unsensed aspriation is likely. Encouraged ambulation and some exercise as tolerated for lung health. Spouse did note Pricella had difficulty expactorating mouth wash today, which she help in her mouth for extended time, and eventually expactored projectile. We discussed slowed processing he is seeing with mobility, self feeding may be occuring with oral motor processing of voluntary movements. 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 as well      Cognitive-Linquistic Treatment   Treatment focused on Patient/family/caregiver education      Assessment / Recommendations / Plan   Plan Continue with current plan of care  Dysphagia Recommendations   Diet recommendations Dysphagia 3 (mechanical soft);Thin liquid    Liquids provided via Straw    Medication Administration Whole meds with puree    Supervision Patient able to self feed;Trained caregiver to feed patient    Compensations Slow rate;Small sips/bites;Check for pocketing    Postural Changes and/or Swallow Maneuvers Out of bed for meals;Upright 30-60 min after meal      Progression Toward Goals   Progression toward goals Progressing toward goals              SLP Education - 04/10/21 1453     Education Details oral care, allow her to self feed as she is able, aspriation pna risks    Person(s) Educated Patient;Spouse;Caregiver(s)    Methods  Explanation;Demonstration;Verbal cues;Handout    Comprehension Verbalized understanding;Returned demonstration;Verbal cues required              SLP Short Term Goals - 04/10/21 1456       SLP SHORT TERM GOAL #1   Title Family and caregiver will carryover 2 swallow precautions when feeding pt with occasional min A    Time 4    Period Weeks    Status On-going      SLP SHORT TERM GOAL #2   Title Family/caregiver will follow 2 diet modficiations for meals and meds to reduce risk of aspiration    Time 4    Period Weeks    Status On-going      SLP SHORT TERM GOAL #3   Title Pt and caregiver will verbalize s/s of aspiration pna with mod I    Time 4    Period Weeks    Status On-going      SLP SHORT TERM GOAL #4   Title Pt will complete MBSS if indicated    Time 4    Period Weeks    Status On-going              SLP Long Term Goals - 04/10/21 1456       SLP LONG TERM GOAL #1   Title Pt/caregiver will follow swallow precautions with mod I    Time 8    Period Weeks    Status On-going      SLP LONG TERM GOAL #2   Title Caregivers will follow diet modifications with mod I    Time 8    Period Weeks    Status On-going              Plan - 04/10/21 1453     Clinical Impression Statement Pamela Alvarez is referred for outpt ST due to some swallowing concerns from sposue. She has coughing after thin liquids inconsistently, as well as coughing without PO, likely due to reduced sensation/saliva pooling in pharynx. Spouse endorses drool as well. Ongoing education re: general diet modifications and swallow precautions and enviornmental and physical wasy to reduce risk of aspiration pna. Educated spouse that pt is likely aspirating due to advanced PSP. At this time, she refuses multiple trials of PO in ST and does not follow commands. ? if she is a candiated for MBSS. She has refused thickened liquids in the past (spouse intitated this on his own). Continue short course of ST to  educated and train family and caregivers on strategies to maximize safety of swallow and reduce risk of aspiration pna.    Speech Therapy Frequency 2x / week    Duration 8 weeks    Treatment/Interventions SLP instruction and feedback;Compensatory strategies;Patient/family education;Multimodal  communcation approach;Cueing hierarchy;Language facilitation;Internal/external aids;Environmental controls;Diet toleration management by SLP;Aspiration precaution training;Other (comment)    Potential to Achieve Goals Fair    Potential Considerations Severity of impairments;Previous level of function;Medical prognosis             Patient will benefit from skilled therapeutic intervention in order to improve the following deficits and impairments:   Dysphagia, oropharyngeal phase  Cognitive communication deficit    Problem List Patient Active Problem List   Diagnosis Date Noted   PSP (progressive supranuclear palsy) (HCC) 02/12/2021   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HCC) 02/12/2021   Unable to ambulate 02/12/2020   Progressive supranuclear palsy (HCC) 10/12/2019   Encephalomalacia on imaging study 10/12/2019   Fluency disorder associated with underlying disease 02/08/2018   Cognitive and neurobehavioral dysfunction 02/08/2018   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HCC) 02/08/2018   Arthritis of carpometacarpal (CMC) joint of left thumb 01/24/2018   Ankle fracture, left 09/03/2016   Hand dysfunction 03/11/2016   Symptomatic varicose veins, bilateral 03/11/2016   Numbness of left thumb 03/11/2016   Elevated BP 07/27/2014   Bunion of left foot 07/26/2014   Abnormality of gait 07/26/2014   Visit for preventive health examination 07/28/2013   Right anterior knee pain 07/22/2012   Medicare annual wellness visit, subsequent 07/25/2011   ANXIETY, SITUATIONAL 09/25/2009   ARTHRITIS 09/25/2009   Vitamin D deficiency 09/27/2008   Hyperlipidemia 09/17/2008   Elevated blood pressure reading without diagnosis of  hypertension 09/17/2008   COLONIC POLYPS, HX OF 09/17/2008   PERSONAL HISTORY DISEASES SKIN&SUBCUT TISSUE 09/17/2008    Orson Rho, Radene Journey, CCC-SLP 04/10/2021, 2:57 PM  [place] [facility] [address] [place], [place], [address] Phone: [phone]   Fax:  [phone]   Name: Pamela Alvarez MRN: [id number] Date of Birth: 1940/07/07

## 2021-04-10 NOTE — Patient Instructions (Addendum)
° °  [facility] helps people with speech and hearing needs - they may take the Touch Talk  Maybe [facility] could use the Touch Talk  Oral care is important to keep bacteria down in the mouth  Wait until Pamela Alvarez is done coughing until you offer more bites or sips  Consider cutting grapes to reduce risk of choking   When you are feeding her, make sure Pamela Alvarez has swallowed and her mouth is clear before you give her another bite or sip  When Pamela Alvarez is feeding herself and doing ok, Pamela Alvarez can decide when to take her next bite and how much Pamela Alvarez wants to bite - Pamela Alvarez can determine this  Pamela Alvarez may have some aspiration Pamela Alvarez doesn't sense due to reduced sensation, this may also be why Pamela Alvarez coughs on her saliva sometime.  Walking and moving arms and legs helps keep the lungs clear  If Pamela Alvarez becomes sick or bed ridden, her risk for aspiration pneumonia increases, you will need to alert your doctor to her swallowing if this happens.   Great job being OOB and eating at the table  You can bring your own snack if you want but don't have to

## 2021-04-10 NOTE — Therapy (Signed)
[facility] [address] [place], [place], [address] Phone: [phone]   Fax:  [phone]  Physical Therapy Treatment  Patient Details  Name: Pamela ApleySandra Arabie MRN: [id number] Date of Birth: 07/15/1940 Referring Provider (PT): Porfirio Mylararmen Dohmeier   Encounter Date: 04/10/2021   PT End of Session - 04/10/21 1319     Visit Number 3    Number of Visits 9   plus eval   Authorization Type [employer] medicare- 9 visits 1/25-2/24/23    Authorization - Visit Number 3    Authorization - Number of Visits 9    PT Start Time 1317    PT Stop Time 1355    PT Time Calculation (min) 38 min    Equipment Utilized During Treatment Gait belt    Activity Tolerance Patient tolerated treatment well;Patient limited by fatigue    Behavior During Therapy Flat affect;WFL for tasks assessed/performed   decreased safety awareness            Past Medical History:  Diagnosis Date   Cyst 07/2008   Cyst on back x2 that were drained   H/O echocardiogram 02/2000   Hx of colonic polyps    Hyperlipidemia    Hypertension    PSP (progressive supranuclear palsy) (HCC)    TBI (traumatic brain injury)    Varicose veins of both lower extremities     Past Surgical History:  Procedure Laterality Date   POLYPECTOMY     Colon   TONSILLECTOMY     TUBAL LIGATION      There were no vitals filed for this visit.   Subjective Assessment - 04/10/21 1319     Subjective Pt's husband reports that she had a fall sliding down to the floor when was sitting edge of bed with husband helping her get changed and she slid down to right side. Husband was able to get her back up.    Patient is accompained by: Family member   husband   Patient Stated Goals To try to maintain mobility as much as possible.    Currently in Pain? No/denies                               [facility] Adult [facility] Treatment/Exercise - 04/10/21 1356       Transfers   Transfers Sit to  Stand;Stand to Sit;Supine to Sit;Sit to Supine;Stand Pivot Transfers    Sit to Stand 4: Min assist    Sit to Stand Details Verbal cues for technique;Tactile cues for initiation    Sit to Stand Details (indicate cue type and reason) PT assisted pt to get feet under her more and then cued to lean forward with PT supporting at shoulder blades from the front. Pt able to come up well like this. Also performed from the side with 1 hand support and other arm around back with same cuing and assist. Husband observing throughout. He preferred being in front and had him try x 5 reps with same cues and he stated "she did all that" and verbalized how much easier it was.    Stand to Sit 4: Min assist    Stand to Sit Details (indicate cue type and reason) Verbal cues for technique;Tactile cues for initiation    Stand to Sit Details Pt was again cued to lean forward prior to going to sit.    Stand Pivot Transfers 3: Mod assist    Stand Pivot Transfer Details (  indicate cue type and reason) With having pt hold PT forearm once up and PT holding behind pt on left side to initiate weight shift to take steps to turn.    Supine to Sit 3: Mod assist    Supine to Sit Details (indicate cue type and reason) Pt's husband used legs to pivot pt around then assisted up at trunk to rise. Advised to just be careful to block legs so she doesn't slide off bed.    Supine to Sit Details Verbal cues for technique    Sit to Supine 3: Mod assist    Sit to Supine Details  PT trialed having pt come down on left side and then bringing feet up only needing min assist to get feet up from sidelying position as started to come up with her.      Ambulation/Gait   Ambulation/Gait Yes    Ambulation/Gait Assistance 4: Min assist    Ambulation/Gait Assistance Details PT assisting to weight shift to help advance feet especially left foot. Guiding with hand held assist in front and support at trunk. Husband following with w/c.    Ambulation Distance  (Feet) 15 Feet    Assistive device 1 person hand held assist    Gait Pattern Step-to pattern;Decreased step length - right;Decreased step length - left    Ambulation Surface Level;Indoor      Neuro Re-ed    Neuro Re-ed Details  Sitting edge of mat: having pt reach forward for a cone on stool in front and then hand to PT to work on anterior trunk lean to help with transfers x 3 minutes. Then performed leaning back on physioball and coming up from reclined position to reach for cone x 2 min. Husband observing throughout. Discussed working on similar activities at home to encourage her to lean forward. Pt responded well to "lean" command.                       PT Short Term Goals - 04/03/21 0837       PT SHORT TERM GOAL #1   Title STGs=LTGs               PT Long Term Goals - 04/03/21 0841       PT LONG TERM GOAL #1   Title Pt's caregivers will be instructed in proper transfer training techniques for improved safety and be able to demonstrate.    Time 4    Period Weeks    Status New    Target Date 05/02/21      PT LONG TERM GOAL #2   Title Pt will be able to walk 25' with bilateral HHA min assist of caregivers to continue to walk short household distances to maintain mobility.    Time 4    Period Weeks    Status New    Target Date 05/02/21      PT LONG TERM GOAL #3   Title Pt will be able to maintain standing x 2 min with RUE support on bar close supervision for improved ADLs.    Time 4    Period Weeks    Status New    Target Date 05/02/21                   Plan - 04/10/21 2010     Clinical Impression Statement PT continued to work on transfer training with pt and her husband. Pt able to demonstrate better forward lean with cuing which  made transfers much easier. Husband was able to return demonstrate the transfer with appropriate cuing. Able to increase gait distance today with assist to weight shift to initiate steps.    Personal Factors and  Comorbidities Comorbidity 3+;Behavior Pattern;Time since onset of injury/illness/exacerbation;Age;Past/Current Experience    Comorbidities depression with anxiety, PSP diagnosed 3 years ago, hyperlipidemia    Examination-Activity Limitations Locomotion Level;Transfers;Bed Mobility;Stand    Examination-Participation Restrictions Community Activity;Interpersonal Relationship    Stability/Clinical Decision Making Evolving/Moderate complexity    Rehab Potential Fair    PT Frequency 2x / week   plus eval   PT Duration 4 weeks    PT Treatment/Interventions ADLs/Self Care Home Management;Gait training;Functional mobility training;Therapeutic activities;Therapeutic exercise;Balance training;Neuromuscular re-education;Patient/family education;DME Instruction    PT Next Visit Plan Review sit <> stand transfers- working on getting forward weight shift first. Try to work on getting pt to initiate coming forward with feet back under her to help. Pt essentially nonverbal as inconsistent with yes/no. Able to follow very simple commands ~25% of the time. Focus on caregiver education to help maintain mobility for as long as possible. Standing tolerance. Gait with HHA when able.    Consulted and Agree with Plan of Care Patient;Family member/caregiver    Family Member Consulted Husband, Chuck and caregiver, [name]             Patient will benefit from skilled therapeutic intervention in order to improve the following deficits and impairments:  Abnormal gait, Decreased cognition, Decreased mobility, Decreased strength, Postural dysfunction, Decreased balance, Decreased knowledge of use of DME, Decreased range of motion  Visit Diagnosis: Other abnormalities of gait and mobility  Muscle weakness (generalized)     Problem List Patient Active Problem List   Diagnosis Date Noted   PSP (progressive supranuclear palsy) (HCC) 02/12/2021   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HCC) 02/12/2021   Unable to ambulate 02/12/2020    Progressive supranuclear palsy (HCC) 10/12/2019   Encephalomalacia on imaging study 10/12/2019   Fluency disorder associated with underlying disease 02/08/2018   Cognitive and neurobehavioral dysfunction 02/08/2018   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HCC) 02/08/2018   Arthritis of carpometacarpal (CMC) joint of left thumb 01/24/2018   Ankle fracture, left 09/03/2016   Hand dysfunction 03/11/2016   Symptomatic varicose veins, bilateral 03/11/2016   Numbness of left thumb 03/11/2016   Elevated BP 07/27/2014   Bunion of left foot 07/26/2014   Abnormality of gait 07/26/2014   Visit for preventive health examination 07/28/2013   Right anterior knee pain 07/22/2012   Medicare annual wellness visit, subsequent 07/25/2011   ANXIETY, SITUATIONAL 09/25/2009   ARTHRITIS 09/25/2009   Vitamin D deficiency 09/27/2008   Hyperlipidemia 09/17/2008   Elevated blood pressure reading without diagnosis of hypertension 09/17/2008   COLONIC POLYPS, HX OF 09/17/2008   PERSONAL HISTORY DISEASES SKIN&SUBCUT TISSUE 09/17/2008    Ronn Melena, PT, DPT, NCS 04/10/2021, 8:14 PM  [facility] [address] [place], [place], [address] Phone: [phone]   Fax:  [phone]  Name: Tiwana Chavis MRN: [id number] Date of Birth: [birth year]

## 2021-04-11 ENCOUNTER — Ambulatory Visit: Payer: Medicare PPO | Admitting: Adult Health

## 2021-04-11 ENCOUNTER — Encounter: Payer: Self-pay | Admitting: Adult Health

## 2021-04-11 VITALS — BP 138/80 | HR 92 | Temp 98.4°F

## 2021-04-11 DIAGNOSIS — R300 Dysuria: Secondary | ICD-10-CM

## 2021-04-11 LAB — POCT URINALYSIS DIPSTICK
Bilirubin, UA: NEGATIVE
Glucose, UA: NEGATIVE
Ketones, UA: NEGATIVE
Nitrite, UA: NEGATIVE
Protein, UA: POSITIVE — AB
Spec Grav, UA: >=1.03 — AB (ref 1.010–1.025)
Urobilinogen, UA: 0.2 E.U./dL
pH, UA: 5.5 (ref 5.0–8.0)

## 2021-04-11 MED ORDER — CIPROFLOXACIN HCL 500 MG PO TABS: 500.0000 mg | ORAL_TABLET | Freq: Two times a day (BID) | ORAL | 0 refills | Status: AC

## 2021-04-11 NOTE — Addendum Note (Signed)
Addended by: Waymon Amato R on: 04/11/2021 04:12 PM   Modules accepted: Orders

## 2021-04-11 NOTE — Addendum Note (Signed)
Addended by: Waymon Amato R on: 04/11/2021 04:03 PM   Modules accepted: Orders

## 2021-04-11 NOTE — Progress Notes (Signed)
Subjective:    Patient ID: Krystl Zuker, female    DOB: [birth year], 81 y.o.   MRN: [id number]  HPI  81 year old female who  has a past medical history of Cyst (07/2008), H/O echocardiogram (02/2000), colonic polyps, Hyperlipidemia, Hypertension, PSP (progressive supranuclear palsy) ([place]), TBI (traumatic brain injury), and Varicose veins of both lower extremities.  She presents to the office today with her husband for an acute issue of concern of a UTI. Her symptoms include that of dysuria, cloudy/odorous urine, decreased urine   No fevers or chills    Review of Systems  Unable to perform ROS: Patient nonverbal  See HPI   Past Medical History:  Diagnosis Date   Cyst 07/2008   Cyst on back x2 that were drained   H/O echocardiogram 02/2000   Hx of colonic polyps    Hyperlipidemia    Hypertension    PSP (progressive supranuclear palsy) (HCC)    TBI (traumatic brain injury)    Varicose veins of both lower extremities     Social History   Socioeconomic History   Marital status: Married    Spouse name: Psychologist, prison and probation services   Number of children: 2   Years of education: Not on file   Highest education level: Doctorate  Occupational History   Occupation: retired    Comment: Teaching laboratory technician [name]  Tobacco Use   Smoking status: Never   Smokeless tobacco: Never  Vaping Use   Vaping Use: Never used  Substance and Sexual Activity   Alcohol use: Not Currently    Alcohol/week: 7.0 standard drinks    Types: 7 Glasses of wine per week    Comment: one a day   Drug use: Not Currently   Sexual activity: Not on file  Other Topics Concern   Not on file  Social History Narrative   Regular exercise-yes   Moved back to [place] from [place] of 2 cat   G3 P2    Exercises walking regu;arly    Retired  Teaching laboratory technician   Drinks one cup of coffee  A day. In addition to walking QD she and her husband take a pilates class on Thursdays. She lives with her husband in a 2 story house, though the  master bedroom in on the first level.       Social Determinants of Health   Financial Resource Strain: Low Risk    Difficulty of Paying Living Expenses: Not hard at all  Food Insecurity: No Food Insecurity   Worried About Charity fundraiser in the Last Year: Never true   [place] in the Last Year: Never true  Transportation Needs: No Transportation Needs   Lack of Transportation (Medical): No   Lack of Transportation (Non-Medical): No  Physical Activity: Inactive   Days of Exercise per Week: 0 days   Minutes of Exercise per Session: 0 min  Stress: No Stress Concern Present   Feeling of Stress : Not at all  Social Connections: Not on file  Intimate Partner Violence: Not on file    Past Surgical History:  Procedure Laterality Date   POLYPECTOMY     Colon   TONSILLECTOMY     TUBAL LIGATION      Family History  Problem Relation Age of Onset   Hypertension Mother    Parkinsonism Mother    Angina Father    Heart disease Father    Colon cancer Father    Lung cancer Father  Skin cancer Father    Cancer Father        Heart   Breast cancer Sister        over 78   Diabetes Sister    Heart disease Brother 73       triple bypass surgery   Macular degeneration Sister    Diabetes Sister        prediabetic   Hearing loss Sister        hearing problems   Vasculitis Sister     Allergies  Allergen Reactions   Dust Mite Extract Cough   Sulfonamide Derivatives     Current Outpatient Medications on File Prior to Visit  Medication Sig Dispense Refill   apixaban (ELIQUIS) 5 MG TABS tablet TAKE 1 TABLET BY MOUTH TWICE A DAY FOR DVT 60 tablet 4   atorvastatin (LIPITOR) 20 MG tablet Take 1 tablet (20 mg total) by mouth daily. 90 tablet 1   Cholecalciferol (VITAMIN D3 PO) Take 1 tablet by mouth daily.     citalopram (CELEXA) 10 MG/5ML suspension Take by mouth.     Famotidine (PEPCID PO) Take 10 mg by mouth at bedtime.     HYDROcodone-acetaminophen (NORCO) 5-325 MG  tablet Take 2 tablets by mouth every 4 (four) hours as needed. 10 tablet 0   losartan (COZAAR) 25 MG tablet TAKE 1 TABLET BY MOUTH EVERY DAY 90 tablet 0   vitamin B-12 (CYANOCOBALAMIN) 500 MCG tablet Take 500 mcg by mouth daily.     LORazepam (ATIVAN) 0.5 MG tablet Take 0.5 mg by mouth daily as needed. (Patient not taking: Reported on 04/11/2021)     No current facility-administered medications on file prior to visit.    BP 138/80    Pulse 92    Temp 98.4 F (36.9 C) (Oral)    SpO2 96%       Objective:   Physical Exam Vitals and nursing note reviewed.  Constitutional:      Appearance: Normal appearance.  Skin:    General: Skin is warm and dry.  Neurological:     Mental Status: She is alert. Mental status is at baseline.      Assessment & Plan:  1. Dysuria - She is unable to urinate today. Will treat due to symptoms. Husband will drop urine sample off if she can give one later today prior to starting abx  therapy  - POC Urinalysis Dipstick - ciprofloxacin (CIPRO) 500 MG tablet; Take 1 tablet (500 mg total) by mouth 2 (two) times daily for 3 days.  Dispense: 6 tablet; Refill: 0 - Urinalysis; Future  Dorothyann Peng, NP

## 2021-04-14 ENCOUNTER — Other Ambulatory Visit: Payer: Medicare PPO

## 2021-04-14 ENCOUNTER — Other Ambulatory Visit: Payer: Self-pay

## 2021-04-14 DIAGNOSIS — R300 Dysuria: Secondary | ICD-10-CM | POA: Diagnosis not present

## 2021-04-14 DIAGNOSIS — Z515 Encounter for palliative care: Secondary | ICD-10-CM

## 2021-04-14 DIAGNOSIS — Z0289 Encounter for other administrative examinations: Secondary | ICD-10-CM

## 2021-04-15 ENCOUNTER — Ambulatory Visit: Payer: Medicare PPO | Admitting: Occupational Therapy

## 2021-04-15 ENCOUNTER — Encounter: Payer: Self-pay | Admitting: Occupational Therapy

## 2021-04-15 ENCOUNTER — Ambulatory Visit: Payer: Medicare PPO

## 2021-04-15 ENCOUNTER — Other Ambulatory Visit: Payer: Self-pay

## 2021-04-15 DIAGNOSIS — R41844 Frontal lobe and executive function deficit: Secondary | ICD-10-CM

## 2021-04-15 DIAGNOSIS — R29818 Other symptoms and signs involving the nervous system: Secondary | ICD-10-CM

## 2021-04-15 DIAGNOSIS — R1312 Dysphagia, oropharyngeal phase: Secondary | ICD-10-CM | POA: Diagnosis not present

## 2021-04-15 DIAGNOSIS — R293 Abnormal posture: Secondary | ICD-10-CM | POA: Diagnosis not present

## 2021-04-15 DIAGNOSIS — R2689 Other abnormalities of gait and mobility: Secondary | ICD-10-CM | POA: Diagnosis not present

## 2021-04-15 DIAGNOSIS — R278 Other lack of coordination: Secondary | ICD-10-CM | POA: Diagnosis not present

## 2021-04-15 DIAGNOSIS — R41842 Visuospatial deficit: Secondary | ICD-10-CM

## 2021-04-15 DIAGNOSIS — R41840 Attention and concentration deficit: Secondary | ICD-10-CM

## 2021-04-15 DIAGNOSIS — M6281 Muscle weakness (generalized): Secondary | ICD-10-CM

## 2021-04-15 NOTE — Therapy (Signed)
[facility] [address] [place], [place], [address] Phone: [phone]   Fax:  [phone]  Occupational Therapy Treatment  Patient Details  Name: Pamela Alvarez MRN: [id number] Date of Birth: 27-Nov-1940 Referring Provider (OT): Dr. Brett Fairy   Encounter Date: 04/15/2021   OT End of Session - 04/15/21 1431     Visit Number 3    Number of Visits 11   anticipate pt will only need 3-6 visits   Date for OT Re-Evaluation 05/07/21    Authorization Type [employer]    Authorization Time Period 5 weeks    Authorization - Visit Number 3    Authorization - Number of Visits 10    Progress Note Due on Visit 10    OT Start Time 1233    OT Stop Time [id number]    OT Time Calculation (min) 44 min    Activity Tolerance Patient tolerated treatment well    Behavior During Therapy Flat affect;WFL for tasks assessed/performed             Past Medical History:  Diagnosis Date   Cyst 07/2008   Cyst on back x2 that were drained   H/O echocardiogram 02/2000   Hx of colonic polyps    Hyperlipidemia    Hypertension    PSP (progressive supranuclear palsy) ([facility])    TBI (traumatic brain injury)    Varicose veins of both lower extremities     Past Surgical History:  Procedure Laterality Date   POLYPECTOMY     Colon   TONSILLECTOMY     TUBAL LIGATION      There were no vitals filed for this visit.   Subjective Assessment - 04/15/21 1430     Subjective  "yes" doing ok "no" in regards to pain, but inconsistent at times    Pertinent History PSP    Limitations do not charge orthotic fit it is not covered by [facility]    Patient Stated Goals cargeiver ed for stretching    Currently in Pain? No/denies   unable to rate, pt inconsistent with yes/no and facial expressions; however, husband reports that pt will gesture to indicate pain                                 OT Education - 04/15/21 1433     Education  Details Reviewed stretching HEP for BUEs--therapist demonstrated again with handouts for LUE and husband returned demo with min cueing for positioning with LUE and moving slow/smoothly stopping with resistance (in pain-free range).  Discussed ways to make this easier for husband including sitting at beside for LUE ROM and gently guiding initiation of movement with RUE (as it is harder for him to reach on that side of the bed).  Also recommended table slides for RUE (flex, horizontal abduction/adduction) and pt returned demo with min facilitation (issue handout next session) and encourage pt to feed herself finger foods (at least initiate) and using RUE as much as possible.    Person(s) Educated Patient;Spouse;Caregiver(s)    Methods Explanation;Demonstration;Tactile cues;Verbal cues;Handout    Comprehension Verbalized understanding;Verbal cues required;Returned demonstration;Tactile cues required;Need further instruction              OT Short Term Goals - 04/02/21 1032       OT SHORT TERM GOAL #1   Title no short term goals  OT Long Term Goals - 04/02/21 1028       OT LONG TERM GOAL #1   Title Pt's caregiver's will be I with HEP for UE ROM and  UE  functional use prn for increased ease with ADLs and decreasing caregiver burden    Time 5    Period Weeks    Target Date 05/07/21      OT LONG TERM GOAL #2   Title Pt will tolerate 70* P/ROM  shoulder flexion, with no significant reports of pain  for increased ease with ADLs.    Baseline RUE grossly 60 shoulder flexion P/ROM with reports of discomfort    Time 5    Period Weeks    Status New                   Plan - 04/15/21 1431     Clinical Impression Statement Pt verbalized understanding of initial HEP and returned demo with cueing.  Hired caregiver also present and verbalized understanding.    OT Occupational Profile and History Detailed Assessment- Review of Records and additional review of physical,  cognitive, psychosocial history related to current functional performance    Occupational performance deficits (Please refer to evaluation for details): ADL's;IADL's;Leisure;Social Participation    Body Structure / Function / Physical Skills ADL;Balance;Mobility;Strength;UE functional use;Tone;FMC;Flexibility;Coordination;Gait;Vision;Sensation;IADL;Dexterity;Decreased knowledge of use of DME;GMC;Decreased knowledge of precautions    Cognitive Skills Attention;Problem Solve;Safety Awareness;Sequencing;Thought;Understand    Rehab Potential Fair    Clinical Decision Making Several treatment options, min-mod task modification necessary    Comorbidities Affecting Occupational Performance: May have comorbidities impacting occupational performance    Modification or Assistance to Complete Evaluation  Min-Moderate modification of tasks or assist with assess necessary to complete eval    OT Frequency 2x / week    OT Duration --   5 weeks   OT Treatment/Interventions Self-care/ADL training;Therapeutic exercise;Splinting;Manual Therapy;Neuromuscular education;Ultrasound;Aquatic Therapy;Therapeutic activities;Paraffin;DME and/or AE instruction;Cognitive remediation/compensation;Visual/perceptual remediation/compensation;Gait Training;Fluidtherapy;Electrical Stimulation;Moist Heat;Contrast Bath;Passive range of motion;Patient/family education    Plan UE functional use as able, review and issue table slides for RUE    Consulted and Agree with Plan of Care Patient;Family member/caregiver   husband and caregiver [name]- caregiver is with pt 40 hs per week            Patient will benefit from skilled therapeutic intervention in order to improve the following deficits and impairments:   Body Structure / Function / Physical Skills: ADL, Balance, Mobility, Strength, UE functional use, Tone, FMC, Flexibility, Coordination, Gait, Vision, Sensation, IADL, Dexterity, Decreased knowledge of use of DME, GMC, Decreased  knowledge of precautions Cognitive Skills: Attention, Problem Solve, Safety Awareness, Sequencing, Thought, Understand     Visit Diagnosis: Other symptoms and signs involving the nervous system  Other lack of coordination  Abnormal posture  Attention and concentration deficit  Frontal lobe and executive function deficit  Muscle weakness (generalized)  Visuospatial deficit    Problem List Patient Active Problem List   Diagnosis Date Noted   PSP (progressive supranuclear palsy) ([facility]) 02/12/2021   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[facility]) 02/12/2021   Unable to ambulate 02/12/2020   Progressive supranuclear palsy ([facility]) 10/12/2019   Encephalomalacia on imaging study 10/12/2019   Fluency disorder associated with underlying disease 02/08/2018   Cognitive and neurobehavioral dysfunction 02/08/2018   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[facility]) 02/08/2018   Arthritis of carpometacarpal (CMC) joint of left thumb 01/24/2018   Ankle fracture, left 09/03/2016   Hand dysfunction 03/11/2016   Symptomatic varicose veins, bilateral 03/11/2016   Numbness of  left thumb 03/11/2016   Elevated BP 07/27/2014   Bunion of left foot 07/26/2014   Abnormality of gait 07/26/2014   Visit for preventive health examination 07/28/2013   Right anterior knee pain 07/22/2012   Medicare annual wellness visit, subsequent 07/25/2011   ANXIETY, SITUATIONAL 09/25/2009   ARTHRITIS 09/25/2009   Vitamin D deficiency 09/27/2008   Hyperlipidemia 09/17/2008   Elevated blood pressure reading without diagnosis of hypertension 09/17/2008   COLONIC POLYPS, HX OF 09/17/2008   PERSONAL HISTORY DISEASES SKIN&SUBCUT TISSUE 09/17/2008    Vianne Bulls, OT 04/15/2021, 2:38 PM  [facility] [address] [place], [place], [address] Phone: [phone]   Fax:  [phone]  Name: Pamela Alvarez MRN: [id number] Date of Birth: 1940/11/03  Vianne Bulls, OTR/L [facility] [address] [place], [place]  [address] [phone] phone [phone] 04/15/21 2:38 PM

## 2021-04-15 NOTE — Therapy (Signed)
[facility] [address] [place], [place], [address] Phone: [phone]   Fax:  [phone]  Physical Therapy Treatment  Patient Details  Name: Pamela Alvarez MRN: [id number] Date of Birth: 04-04-1940 Referring Provider (PT): Porfirio Mylar Dohmeier   Encounter Date: 04/15/2021   PT End of Session - 04/15/21 1316     Visit Number 4    Number of Visits 9   plus eval   Authorization Type [employer] medicare- 9 visits 1/25-2/24/23    Authorization - Visit Number 4    Authorization - Number of Visits 9    PT Start Time 1316    PT Stop Time 1350   pt fatigued with back to back sessions and having hair done   PT Time Calculation (min) 34 min    Equipment Utilized During Treatment --    Activity Tolerance Patient tolerated treatment well;Patient limited by fatigue    Behavior During Therapy Flat affect;WFL for tasks assessed/performed   decreased safety awareness            Past Medical History:  Diagnosis Date   Cyst 07/2008   Cyst on back x2 that were drained   H/O echocardiogram 02/2000   Hx of colonic polyps    Hyperlipidemia    Hypertension    PSP (progressive supranuclear palsy) ([facility])    TBI (traumatic brain injury)    Varicose veins of both lower extremities     Past Surgical History:  Procedure Laterality Date   POLYPECTOMY     Colon   TONSILLECTOMY     TUBAL LIGATION      There were no vitals filed for this visit.   Subjective Assessment - 04/15/21 1316     Subjective Pt, husband and caregiver, [name] present. Husband reports that the transfers are so much easier now. They are also using the shower chair in home versus the transport chair in home as is higher up and that has helped a lot. Pt was also diagnosed with UTI last week and finished a 3 day course of antibiotics and seems to be doing better. Had not been able to urinate which is what they noticed.    Patient is accompained by: Family member   husband    Patient Stated Goals To try to maintain mobility as much as possible.    Currently in Pain? No/denies   husband denies any overt signs of pain                              [facility] Adult PT Treatment/Exercise - 04/15/21 1322       Transfers   Transfers Sit to Stand;Stand to Sit;Sit to Supine;Supine to Sit    Sit to Stand 4: Min assist    Sit to Stand Details Verbal cues for technique    Sit to Stand Details (indicate cue type and reason) Pt was cued to lean forward prior to transfer. Pt's husband performed from mat one rep with good technique.    Stand to Sit 4: Min assist    Stand to Sit Details (indicate cue type and reason) Verbal cues for technique    Stand to Sit Details Pt was cued to lean forward like trying to hug husband prior to sitting.    Supine to Sit 3: Mod assist    Supine to Sit Details (indicate cue type and reason) PT rotated legs off mat and then cued pt to lean forward  when assisting trunk up.    Supine to Sit Details Verbal cues for technique    Sit to Supine 3: Mod assist    Sit to Supine Details (indicate cue type and reason) Verbal cues for technique    Sit to Supine Details  PT assisted pt to come down to left and legs started to come up on own. Pt's husband assisted legs to fully clear on to mat. Once on mat PT instructed pt to help adjust pt with getting under shoulders at shoulder blade or under hips and he was able to demonstrate correctly.      Neuro Re-ed    Neuro Re-ed Details  Standing at // bars in front with both hands on bar for support CGA. PT assisted to guide left hand and able to maintain grasp. Pt stood for just over 2 minutes. Then repeated with having pt reach for 1.1# and 2.2# balls and handing them to loved ones on the side x 6 with RUE. Max cuing to hand off ball.                     PT Education - 04/15/21 1836     Education Details Discussed continuing to work on sitting acitivities reaching forward and also can  work on standing at sink or at grab bar in bathroom with reaching for items for home exercises.    Person(s) Educated Patient;Caregiver(s);Spouse    Methods Explanation;Demonstration    Comprehension Verbalized understanding              PT Short Term Goals - 04/03/21 0837       PT SHORT TERM GOAL #1   Title STGs=LTGs               PT Long Term Goals - 04/03/21 0841       PT LONG TERM GOAL #1   Title Pt's caregivers will be instructed in proper transfer training techniques for improved safety and be able to demonstrate.    Time 4    Period Weeks    Status New    Target Date 05/02/21      PT LONG TERM GOAL #2   Title Pt will be able to walk 25' with bilateral HHA min assist of caregivers to continue to walk short household distances to maintain mobility.    Time 4    Period Weeks    Status New    Target Date 05/02/21      PT LONG TERM GOAL #3   Title Pt will be able to maintain standing x 2 min with RUE support on bar close supervision for improved ADLs.    Time 4    Period Weeks    Status New    Target Date 05/02/21                   Plan - 04/15/21 1837     Clinical Impression Statement Pt's husband and caregiver both reporting increased ease with transfers at home. Husband was able to demonstrate proper performance with transfers. Continued to work on functional activities in standing more with having pt reach for items.    Personal Factors and Comorbidities Comorbidity 3+;Behavior Pattern;Time since onset of injury/illness/exacerbation;Age;Past/Current Experience    Comorbidities depression with anxiety, PSP diagnosed 3 years ago, hyperlipidemia    Examination-Activity Limitations Locomotion Level;Transfers;Bed Mobility;Stand    Examination-Participation Restrictions Community Activity;Interpersonal Relationship    Stability/Clinical Decision Making Evolving/Moderate complexity    Rehab Potential Fair  PT Frequency 2x / week   plus eval   PT  Duration 4 weeks    PT Treatment/Interventions ADLs/Self Care Home Management;Gait training;Functional mobility training;Therapeutic activities;Therapeutic exercise;Balance training;Neuromuscular re-education;Patient/family education;DME Instruction    PT Next Visit Plan Review sit <> stand transfers- working on getting forward weight shift first as needed. Try to work on getting pt to initiate coming forward with feet back under her to help. Work on standing and sitting activities to encourage reaching forward. Pt essentially nonverbal as inconsistent with yes/no. Able to follow very simple commands ~25% of the time. Focus on caregiver education to help maintain mobility for as long as possible. Standing tolerance. Gait with HHA.    Consulted and Agree with Plan of Care Patient;Family member/caregiver    Family Member Consulted Husband, Chuck and caregiver, [name]             Patient will benefit from skilled therapeutic intervention in order to improve the following deficits and impairments:  Abnormal gait, Decreased cognition, Decreased mobility, Decreased strength, Postural dysfunction, Decreased balance, Decreased knowledge of use of DME, Decreased range of motion  Visit Diagnosis: Other abnormalities of gait and mobility  Muscle weakness (generalized)     Problem List Patient Active Problem List   Diagnosis Date Noted   PSP (progressive supranuclear palsy) ([facility]) 02/12/2021   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[facility]) 02/12/2021   Unable to ambulate 02/12/2020   Progressive supranuclear palsy ([facility]) 10/12/2019   Encephalomalacia on imaging study 10/12/2019   Fluency disorder associated with underlying disease 02/08/2018   Cognitive and neurobehavioral dysfunction 02/08/2018   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[facility]) 02/08/2018   Arthritis of carpometacarpal (CMC) joint of left thumb 01/24/2018   Ankle fracture, left 09/03/2016   Hand dysfunction 03/11/2016   Symptomatic varicose veins, bilateral  03/11/2016   Numbness of left thumb 03/11/2016   Elevated BP 07/27/2014   Bunion of left foot 07/26/2014   Abnormality of gait 07/26/2014   Visit for preventive health examination 07/28/2013   Right anterior knee pain 07/22/2012   Medicare annual wellness visit, subsequent 07/25/2011   ANXIETY, SITUATIONAL 09/25/2009   ARTHRITIS 09/25/2009   Vitamin D deficiency 09/27/2008   Hyperlipidemia 09/17/2008   Elevated blood pressure reading without diagnosis of hypertension 09/17/2008   COLONIC POLYPS, HX OF 09/17/2008   PERSONAL HISTORY DISEASES SKIN&SUBCUT TISSUE 09/17/2008    Ronn Melena, PT, DPT, NCS 04/15/2021, 6:40 PM  [facility] [address] [place], [place], [address] Phone: [phone]   Fax:  [phone]  Name: Pamela Alvarez MRN: [id number] Date of Birth: Sep 23, 1940

## 2021-04-16 LAB — URINE CULTURE
MICRO NUMBER:: 12967824
SPECIMEN QUALITY:: ADEQUATE

## 2021-04-17 ENCOUNTER — Encounter: Payer: Self-pay | Admitting: Internal Medicine

## 2021-04-17 ENCOUNTER — Ambulatory Visit: Payer: Medicare PPO | Admitting: Speech Pathology

## 2021-04-17 ENCOUNTER — Telehealth (INDEPENDENT_AMBULATORY_CARE_PROVIDER_SITE_OTHER): Payer: Medicare PPO | Admitting: Internal Medicine

## 2021-04-17 ENCOUNTER — Other Ambulatory Visit: Payer: Self-pay

## 2021-04-17 ENCOUNTER — Ambulatory Visit: Payer: Medicare PPO

## 2021-04-17 DIAGNOSIS — R41844 Frontal lobe and executive function deficit: Secondary | ICD-10-CM | POA: Diagnosis not present

## 2021-04-17 DIAGNOSIS — R41842 Visuospatial deficit: Secondary | ICD-10-CM | POA: Diagnosis not present

## 2021-04-17 DIAGNOSIS — M6281 Muscle weakness (generalized): Secondary | ICD-10-CM | POA: Diagnosis not present

## 2021-04-17 DIAGNOSIS — G231 Progressive supranuclear ophthalmoplegia [Steele-Richardson-Olszewski]: Secondary | ICD-10-CM

## 2021-04-17 DIAGNOSIS — R293 Abnormal posture: Secondary | ICD-10-CM | POA: Diagnosis not present

## 2021-04-17 DIAGNOSIS — Z79899 Other long term (current) drug therapy: Secondary | ICD-10-CM

## 2021-04-17 DIAGNOSIS — R21 Rash and other nonspecific skin eruption: Secondary | ICD-10-CM

## 2021-04-17 DIAGNOSIS — R1312 Dysphagia, oropharyngeal phase: Secondary | ICD-10-CM | POA: Diagnosis not present

## 2021-04-17 DIAGNOSIS — N390 Urinary tract infection, site not specified: Secondary | ICD-10-CM

## 2021-04-17 DIAGNOSIS — R41840 Attention and concentration deficit: Secondary | ICD-10-CM | POA: Diagnosis not present

## 2021-04-17 DIAGNOSIS — R41841 Cognitive communication deficit: Secondary | ICD-10-CM

## 2021-04-17 DIAGNOSIS — R278 Other lack of coordination: Secondary | ICD-10-CM | POA: Diagnosis not present

## 2021-04-17 DIAGNOSIS — R2689 Other abnormalities of gait and mobility: Secondary | ICD-10-CM

## 2021-04-17 DIAGNOSIS — R29818 Other symptoms and signs involving the nervous system: Secondary | ICD-10-CM | POA: Diagnosis not present

## 2021-04-17 MED ORDER — CEFDINIR 300 MG PO CAPS: 300.0000 mg | ORAL_CAPSULE | Freq: Two times a day (BID) | ORAL | 0 refills | Status: DC

## 2021-04-17 NOTE — Therapy (Signed)
[facility] [address] [place], [place], [address] Phone: [phone]   Fax:  [phone]  Speech Language Pathology Treatment  Patient Details  Name: Pamela Alvarez MRN: [id number] Date of Birth: Jul 16, 1940 Referring Provider (SLP): Dohmeier, Asencion Partridge, MD   Encounter Date: 04/17/2021   End of Session - 04/17/21 1153     Visit Number 4    Number of Visits 17    Date for SLP Re-Evaluation 05/30/21    Authorization Type [employer]    SLP Start Time 1015    SLP Stop Time  1100    SLP Time Calculation (min) 45 min    Activity Tolerance Patient tolerated treatment well;Patient limited by fatigue             Past Medical History:  Diagnosis Date   Cyst 07/2008   Cyst on back x2 that were drained   H/O echocardiogram 02/2000   Hx of colonic polyps    Hyperlipidemia    Hypertension    PSP (progressive supranuclear palsy) ([place])    TBI (traumatic brain injury)    Varicose veins of both lower extremities     Past Surgical History:  Procedure Laterality Date   POLYPECTOMY     Colon   TONSILLECTOMY     TUBAL LIGATION      There were no vitals filed for this visit.   Subjective Assessment - 04/17/21 1021     Subjective "She is dealing with a UTI"    Patient is accompained by: Family member   spouse Morris   Currently in Pain? No/denies                   ADULT SLP TREATMENT - 04/17/21 1022       General Information   Behavior/Cognition Alert;Cooperative;Distractible;Requires cueing      Treatment Provided   Treatment provided Dysphagia      Dysphagia Treatment   Temperature Spikes Noted No    Respiratory Status Room air    Oral Cavity - Dentition Adequate natural dentition    Patient observed directly with PO's Yes    Type of PO's observed Thin liquids   Regular solid they brought in nut granola bar   Feeding Able to feed self    Liquids provided via Straw    Pharyngeal Phase Signs &  Symptoms --   No s/s of aspriation today - reviewed silent aspiration   Amount of cueing Minimal    Other treatment/comments PO trials of water and granola bar with no overt s/s of aspiration. Morris reports increased drool, especially when she is on the commode. Instructed him to giver her verbal cues to swallow several times when she is on commode. Trained spouse in use of 1-2 words to cue pt when he needs to, rather than more lengthy explanations due to slow processing and cognitive impairments      Cognitive-Linquistic Treatment   Treatment focused on Patient/family/caregiver education      Assessment / Recommendations / Plan   Plan Continue with current plan of care      Dysphagia Recommendations   Diet recommendations Dysphagia 3 (mechanical soft);Thin liquid    Liquids provided via Straw    Medication Administration Crushed with puree    Supervision Patient able to self feed;Trained caregiver to feed patient    Compensations Slow rate;Small sips/bites;Check for pocketing    Postural Changes and/or Swallow Maneuvers Out of bed for meals;Upright 30-60 min after meal  Progression Toward Goals   Progression toward goals Progressing toward goals                SLP Short Term Goals - 04/17/21 1151       SLP SHORT TERM GOAL #1   Title Family and caregiver will carryover 2 swallow precautions when feeding pt with occasional min A    Time 3    Period Weeks    Status Achieved      SLP SHORT TERM GOAL #2   Title Family/caregiver will follow 2 diet modficiations for meals and meds to reduce risk of aspiration    Time 4    Period Weeks    Status Achieved      SLP SHORT TERM GOAL #3   Title Pt and caregiver will verbalize s/s of aspiration pna with mod I    Time 3    Period Weeks    Status On-going      SLP SHORT TERM GOAL #4   Title Pt will complete MBSS if indicated    Time 4    Period Weeks    Status Deferred              SLP Long Term Goals - 04/17/21  1152       SLP LONG TERM GOAL #1   Title Pt/caregiver will follow swallow precautions with mod I    Time 7    Period Weeks    Status On-going      SLP LONG TERM GOAL #2   Title Caregivers will follow diet modifications with mod I    Time 7    Period Weeks    Status On-going              Plan - 04/17/21 1150     Clinical Impression Statement Pamela Alvarez is referred for outpt ST due to some swallowing concerns from sposue. She has coughing after thin liquids inconsistently, as well as coughing without PO, likely due to reduced sensation/saliva pooling in pharynx. Spouse endorses drool as well. Ongoing education re: general diet modifications and swallow precautions and enviornmental and physical wasy to reduce risk of aspiration pna. Educated spouse that pt is likely aspirating due to advanced PSP. At this time, she refuses multiple trials of PO in ST and does not follow commands. ? if she is a candiated for MBSS. She has refused thickened liquids in the past (spouse intitated this on his own). Continue short course of ST to educated and train family and caregivers on strategies to maximize safety of swallow and reduce risk of aspiration pna.    Speech Therapy Frequency 2x / week    Duration 8 weeks    Treatment/Interventions SLP instruction and feedback;Compensatory strategies;Patient/family education;Multimodal communcation approach;Cueing hierarchy;Language facilitation;Internal/external aids;Environmental controls;Diet toleration management by SLP;Aspiration precaution training;Other (comment)    Potential to Achieve Goals Fair    Potential Considerations Severity of impairments;Previous level of function;Medical prognosis             Patient will benefit from skilled therapeutic intervention in order to improve the following deficits and impairments:   Dysphagia, oropharyngeal phase  Cognitive communication deficit    Problem List Patient Active Problem List    Diagnosis Date Noted   PSP (progressive supranuclear palsy) ([place]) 02/12/2021   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[place]) 02/12/2021   Unable to ambulate 02/12/2020   Progressive supranuclear palsy ([facility]) 10/12/2019   Encephalomalacia on imaging study 10/12/2019   Fluency disorder associated with underlying disease 02/08/2018  Cognitive and neurobehavioral dysfunction 02/08/2018   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[place]) 02/08/2018   Arthritis of carpometacarpal (CMC) joint of left thumb 01/24/2018   Ankle fracture, left 09/03/2016   Hand dysfunction 03/11/2016   Symptomatic varicose veins, bilateral 03/11/2016   Numbness of left thumb 03/11/2016   Elevated BP 07/27/2014   Bunion of left foot 07/26/2014   Abnormality of gait 07/26/2014   Visit for preventive health examination 07/28/2013   Right anterior knee pain 07/22/2012   Medicare annual wellness visit, subsequent 07/25/2011   ANXIETY, SITUATIONAL 09/25/2009   ARTHRITIS 09/25/2009   Vitamin D deficiency 09/27/2008   Hyperlipidemia 09/17/2008   Elevated blood pressure reading without diagnosis of hypertension 09/17/2008   COLONIC POLYPS, HX OF 09/17/2008   PERSONAL HISTORY DISEASES SKIN&SUBCUT TISSUE 09/17/2008    Jalesia Loudenslager, Annye Rusk, CCC-SLP 04/17/2021, 11:54 AM  [facility] [address] [place], [place], [address] Phone: [phone]   Fax:  [phone]   Name: Pamela Alvarez MRN: [id number] Date of Birth: 05/05/1940

## 2021-04-17 NOTE — Therapy (Signed)
[facility] [address] [place], [place], [address] Phone: [phone]   Fax:  [phone]  Physical Therapy Treatment  Patient Details  Name: Pamela Alvarez MRN: [id number] Date of Birth: 21-Oct-1940 Referring Provider (PT): Porfirio Mylar Dohmeier   Encounter Date: 04/17/2021   PT End of Session - 04/17/21 1109     Visit Number 5    Number of Visits 9   plus eval   Authorization Type [employer] medicare- 9 visits 1/25-2/24/23    Authorization - Visit Number 5    Authorization - Number of Visits 9    PT Start Time 1108    PT Stop Time 1140    PT Time Calculation (min) 32 min    Activity Tolerance Patient tolerated treatment well;Patient limited by fatigue    Behavior During Therapy Flat affect;WFL for tasks assessed/performed   decreased safety awareness            Past Medical History:  Diagnosis Date   Cyst 07/2008   Cyst on back x2 that were drained   H/O echocardiogram 02/2000   Hx of colonic polyps    Hyperlipidemia    Hypertension    PSP (progressive supranuclear palsy) (HCC)    TBI (traumatic brain injury)    Varicose veins of both lower extremities     Past Surgical History:  Procedure Laterality Date   POLYPECTOMY     Colon   TONSILLECTOMY     TUBAL LIGATION      There were no vitals filed for this visit.   Subjective Assessment - 04/17/21 1110     Subjective Pt's husband reports that they are still working on the UTI. She was started on new antibiotic for 7 days. Exercises going well.    Patient is accompained by: Family member   husband   Patient Stated Goals To try to maintain mobility as much as possible.    Currently in Pain? No/denies    Pain Onset More than a month ago                               [facility] Adult PT Treatment/Exercise - 04/17/21 1111       Transfers   Transfers Sit to Stand;Stand to Sit    Sit to Stand 4: Min assist    Sit to Stand Details Verbal cues  for technique;Tactile cues for weight shifting;Tactile cues for initiation    Sit to Stand Details (indicate cue type and reason) Verbal cues to lean forward. Performed x 3 in a row at mat    Stand to Sit 4: Min assist    Stand to Sit Details (indicate cue type and reason) Verbal cues for technique;Tactile cues for initiation    Stand to Sit Details Verbal cues to stand tall and lean forward prior to sitting. Performed x 3 in a row at mat. Had husband perform after observing and he was able to demonstrate better cuing to get pt to lean forward prior to sitting.      Ambulation/Gait   Ambulation/Gait Yes    Ambulation/Gait Assistance 4: Min assist    Ambulation/Gait Assistance Details PT observing husband with him walking with bilateral HHA at forearms. Husband did well with cuing pt to not lean back and initiating weight shift to help with step initiation.    Ambulation Distance (Feet) 60 Feet    Assistive device 1 person hand held assist  Gait Pattern Step-to pattern;Decreased step length - right;Decreased step length - left    Ambulation Surface Level;Indoor      Self-Care   Self-Care Other Self-Care Comments    Other Self-Care Comments  Discussed positioning arm in bed to try to encourage more extension on pillow.      Neuro Re-ed    Neuro Re-ed Details  Standing in front of mat working on upright posture and reaching left arm to PT hip with PT facilitating at elbow x 5 min with moving arm up and down a few times. Sitting with reaching with RLE for PT hand x 4 with max cuing to initiate activity. Worked on sitting upright with cues to sit up straight with pt showing improved upright posture and resting left hand on knee versus across body.                     PT Education - 04/17/21 1354     Education Details Education on standing tall and then cuing for anterior lean prior to performing stand to sit to decrease posterior lean and save caregiver back. Discussed possible d/c  end of next week.    Person(s) Educated Patient;Spouse    Methods Explanation;Demonstration    Comprehension Verbalized understanding;Returned demonstration              PT Short Term Goals - 04/03/21 0837       PT SHORT TERM GOAL #1   Title STGs=LTGs               PT Long Term Goals - 04/17/21 1356       PT LONG TERM GOAL #1   Title Pt's caregivers will be instructed in proper transfer training techniques for improved safety and be able to demonstrate.    Time 4    Period Weeks    Status New    Target Date 05/02/21      PT LONG TERM GOAL #2   Title Pt will be able to walk 25' with bilateral HHA min assist of caregivers to continue to walk short household distances to maintain mobility.    Baseline 04/16/21 60' min assist with husband    Time 4    Period Weeks    Status Achieved    Target Date 05/02/21      PT LONG TERM GOAL #3   Title Pt will be able to maintain standing x 2 min with RUE support on bar close supervision for improved ADLs.    Time 4    Period Weeks    Status New    Target Date 05/02/21                   Plan - 04/17/21 1355     Clinical Impression Statement Pt continues to demonstrate more upright posture in sitting with being able to relax left arm more. Husband showing good carryover with transfer training and is able to help wife initiate more anterior lean with transfers. He demonstrated good technique with gait today and able to increase distance.    Personal Factors and Comorbidities Comorbidity 3+;Behavior Pattern;Time since onset of injury/illness/exacerbation;Age;Past/Current Experience    Comorbidities depression with anxiety, PSP diagnosed 3 years ago, hyperlipidemia    Examination-Activity Limitations Locomotion Level;Transfers;Bed Mobility;Stand    Examination-Participation Restrictions Community Activity;Interpersonal Relationship    Stability/Clinical Decision Making Evolving/Moderate complexity    Rehab Potential Fair     PT Frequency 2x / week   plus eval  PT Duration 4 weeks    PT Treatment/Interventions ADLs/Self Care Home Management;Gait training;Functional mobility training;Therapeutic activities;Therapeutic exercise;Balance training;Neuromuscular re-education;Patient/family education;DME Instruction    PT Next Visit Plan Discussed possible discharge end of next week so please review mobility one more time with husband. Review sit <> stand transfers- working on getting forward weight shift first as needed. Try to work on getting pt to initiate coming forward with feet back under her to help. Work on standing and sitting activities to encourage reaching forward. Pt essentially nonverbal as inconsistent with yes/no. Able to follow very simple commands ~25% of the time. Focus on caregiver education to help maintain mobility for as long as possible. Standing tolerance. Gait with HHA.    Consulted and Agree with Plan of Care Patient;Family member/caregiver    Family Member Consulted Husband, Chuck and caregiver, [name]             Patient will benefit from skilled therapeutic intervention in order to improve the following deficits and impairments:  Abnormal gait, Decreased cognition, Decreased mobility, Decreased strength, Postural dysfunction, Decreased balance, Decreased knowledge of use of DME, Decreased range of motion  Visit Diagnosis: Other abnormalities of gait and mobility  Muscle weakness (generalized)     Problem List Patient Active Problem List   Diagnosis Date Noted   PSP (progressive supranuclear palsy) (HCC) 02/12/2021   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HCC) 02/12/2021   Unable to ambulate 02/12/2020   Progressive supranuclear palsy (HCC) 10/12/2019   Encephalomalacia on imaging study 10/12/2019   Fluency disorder associated with underlying disease 02/08/2018   Cognitive and neurobehavioral dysfunction 02/08/2018   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HCC) 02/08/2018   Arthritis of carpometacarpal (CMC)  joint of left thumb 01/24/2018   Ankle fracture, left 09/03/2016   Hand dysfunction 03/11/2016   Symptomatic varicose veins, bilateral 03/11/2016   Numbness of left thumb 03/11/2016   Elevated BP 07/27/2014   Bunion of left foot 07/26/2014   Abnormality of gait 07/26/2014   Visit for preventive health examination 07/28/2013   Right anterior knee pain 07/22/2012   Medicare annual wellness visit, subsequent 07/25/2011   ANXIETY, SITUATIONAL 09/25/2009   ARTHRITIS 09/25/2009   Vitamin D deficiency 09/27/2008   Hyperlipidemia 09/17/2008   Elevated blood pressure reading without diagnosis of hypertension 09/17/2008   COLONIC POLYPS, HX OF 09/17/2008   PERSONAL HISTORY DISEASES SKIN&SUBCUT TISSUE 09/17/2008    Ronn Melena, PT, DPT, NCS 04/17/2021, 1:58 PM  [facility] [address] [place], [place], [address] Phone: [phone]   Fax:  [phone]  Name: Jennay Pivirotto MRN: [id number] Date of Birth: 1940-11-23

## 2021-04-17 NOTE — Patient Instructions (Signed)
° °  When she is on the toilet, cue her to swallow a couple of times   Crush meds and put in applesauce, pudding, yogurt, ice cream - great job!  Thanks for cutting her grapes  Try to avoid mixed consistencies - foods with solids and liquids  When you give her directions - short, simple 1-2 words - don't over explain, keep it short and sweet  Her processing is slow, so she can't grasp a lot of words  Home health PT may be an option for her next course of therapy  Don't eat 2 hours before bed so she doesn't aspirate reflux

## 2021-04-17 NOTE — Progress Notes (Signed)
PATIENT NAME: Pamela Alvarez DOB: 1940/04/11 MRN: [id number]  PRIMARY CARE PROVIDER: [name], MD  RESPONSIBLE PARTY:  Acct ID - Guarantor Home Phone Work Phone Relationship Acct Type  [id number] Pamela Alvarez, Pamela Alvarez [phone]  Self P/F     [address], [place], [place] [address]   Palliative care RN telephonic encounter completed with patient's husband. Patient has a rash on her thighs and buttocks. Area has some red spots. This started on 04/11/21. No evidence of pain or itching to rash. Encouraged to try over the counter skin protective. Discussed with NP, may try over the counter anti-fungal ointment with the skin protective. Patient's husband updated  Estanislado Pandy, RN

## 2021-04-17 NOTE — Progress Notes (Signed)
Virtual Visit via Video Note  I connected with Pamela ApleySandra Alvarez on 04/17/21 at  9:00 AM EST by a video enabled telemedicine application and verified that I am speaking with the correct person using two identifiers. Location patient: home Location provider home office Persons participating in the virtual visit: patient, provider spouse caretaker  Reinaldo RaddleWIth national recommendations  regarding COVID 19 pandemic   video visit is advised over in office visit for this patient.  Patient aware  of the limitations of evaluation and management by telemedicine and  availability of in person appointments. and agreed to proceed.   HPI: Pamela ApleySandra Faniel presents for video visit because of ongoing urinary symptoms. Last week he noticed that Pamela Alvarez had difficulty urinating and would hold it and did not go for most 24 hours.  Urinary sample revealed Citrobacter pan sensitive but in the meantime was treated with 3 days of Cipro twice daily.  Some improvement he also gave her over-the-counter Pyridium which seemed to help her urinate.  However she appears to be having urinary discomfort again no associated fever constipation diarrhea.  She does have somewhat of a rash in the perineal area with some bumps but no blisters or pustules.  More like a skin irritation. No fever chills obvious. She is allergic to sulfa meds   ROS: See pertinent positives and negatives per HPI.  Past Medical History:  Diagnosis Date   Cyst 07/2008   Cyst on back x2 that were drained   H/O echocardiogram 02/2000   Hx of colonic polyps    Hyperlipidemia    Hypertension    PSP (progressive supranuclear palsy) (HCC)    TBI (traumatic brain injury)    Varicose veins of both lower extremities     Past Surgical History:  Procedure Laterality Date   POLYPECTOMY     Colon   TONSILLECTOMY     TUBAL LIGATION      Family History  Problem Relation Age of Onset   Hypertension Mother    Parkinsonism Mother    Angina Father    Heart disease  Father    Colon cancer Father    Lung cancer Father    Skin cancer Father    Cancer Father        Heart   Breast cancer Sister        over [id number]   Diabetes Sister    Heart disease Brother 3555       triple bypass surgery   Macular degeneration Sister    Diabetes Sister        prediabetic   Hearing loss Sister        hearing problems   Vasculitis Sister     Social History   Tobacco Use   Smoking status: Never   Smokeless tobacco: Never  Vaping Use   Vaping Use: Never used  Substance Use Topics   Alcohol use: Not Currently    Alcohol/week: 7.0 standard drinks    Types: 7 Glasses of wine per week    Comment: one a day   Drug use: Not Currently      Current Outpatient Medications:    cefdinir (OMNICEF) 300 MG capsule, Take 1 capsule (300 mg total) by mouth 2 (two) times daily. For uti, Disp: 14 capsule, Rfl: 0   apixaban (ELIQUIS) 5 MG TABS tablet, TAKE 1 TABLET BY MOUTH TWICE A DAY FOR DVT, Disp: 60 tablet, Rfl: 4   atorvastatin (LIPITOR) 20 MG tablet, Take 1 tablet (20 mg total) by mouth  daily., Disp: 90 tablet, Rfl: 1   Cholecalciferol (VITAMIN D3 PO), Take 1 tablet by mouth daily., Disp: , Rfl:    citalopram (CELEXA) 10 MG/5ML suspension, Take by mouth., Disp: , Rfl:    Famotidine (PEPCID PO), Take 10 mg by mouth at bedtime., Disp: , Rfl:    HYDROcodone-acetaminophen (NORCO) 5-325 MG tablet, Take 2 tablets by mouth every 4 (four) hours as needed., Disp: 10 tablet, Rfl: 0   LORazepam (ATIVAN) 0.5 MG tablet, Take 0.5 mg by mouth daily as needed. (Patient not taking: Reported on 04/11/2021), Disp: , Rfl:    losartan (COZAAR) 25 MG tablet, TAKE 1 TABLET BY MOUTH EVERY DAY, Disp: 90 tablet, Rfl: 0   vitamin B-12 (CYANOCOBALAMIN) 500 MCG tablet, Take 500 mcg by mouth daily., Disp: , Rfl:   EXAM: BP Readings from Last 3 Encounters:  04/11/21 138/80  03/27/21 138/82  03/11/21 136/68    VITALS per patient if applicable:  GENERAL: alert, oriented, appears well and in no acute  distress  HEENT: atraumatic, conjunttiva clear, no obvious abnormalities on inspection of external nose and ears  NECK: normal movements of the head and neck  LUNGS: on inspection no signs of respiratory distress, breathing rate appears normal, no obvious gross SOB, gasping or wheezing  CV: no obvious cyanosis  MS: moves all visible extremities without noticeable abnormality  PSYCH/NEURO: pleasant and cooperative, no obvious depression or anxiety, speech and thought processing grossly intact Lab Results  Component Value Date   WBC 9.4 03/09/2021   HGB 13.4 03/09/2021   HCT 41.5 03/09/2021   PLT 275 03/09/2021   GLUCOSE 128 (H) 03/09/2021   CHOL 222 (H) 12/25/2020   TRIG 85.0 12/25/2020   HDL 75.60 12/25/2020   LDLDIRECT 191.8 07/22/2012   LDLCALC 129 (H) 12/25/2020   ALT 33 03/09/2021   AST 30 03/09/2021   NA 141 03/09/2021   K 3.8 03/09/2021   CL 108 03/09/2021   CREATININE 1.05 (H) 03/09/2021   BUN 21 03/09/2021   CO2 26 03/09/2021   TSH 3.27 12/25/2020   HGBA1C 5.3 12/25/2020   Record review culture shows Citrobacter koseri pansensitive. ASSESSMENT AND PLAN:  Discussed the following assessment and plan:    ICD-10-CM   1. Urinary tract infection without hematuria, site unspecified  N39.0    citrobacter koseri rx with cipro    2. Perineal rash  R21    most likely irritative  poss yeast.     3. Progressive supranuclear palsy (HCC)  G23.1     4. Medication management  Z79.899      Appears to be partially treated UTI with symptoms not consistent with other complication. Would advise treating for a week Omnicef twice daily for 7 days if needed we can switch to liquid okay to use the Pyridium as needed Okay to use a topical clotrimazole with zinc oxide preparation or other in the perineal area. If persistent progressive may need to get another urine specimen Give [place] update near the end of the medicine next week or as needed Counseled.   Expectant management and  discussion of plan and treatment with opportunity to ask questions and all were answered. The patient agreed with the plan and demonstrated an understanding of the instructions.   Advised to call back or seek an in-person evaluation if worsening  or having  further concerns  in interim. Return if symptoms worsen or fail to improve, for update how doing  at end of medicaiton.    [name],  MD

## 2021-04-18 ENCOUNTER — Other Ambulatory Visit: Payer: Self-pay

## 2021-04-18 DIAGNOSIS — I872 Venous insufficiency (chronic) (peripheral): Secondary | ICD-10-CM

## 2021-04-21 ENCOUNTER — Encounter (HOSPITAL_COMMUNITY): Payer: Medicare PPO

## 2021-04-22 DIAGNOSIS — L309 Dermatitis, unspecified: Secondary | ICD-10-CM | POA: Diagnosis not present

## 2021-04-22 DIAGNOSIS — L814 Other melanin hyperpigmentation: Secondary | ICD-10-CM | POA: Diagnosis not present

## 2021-04-22 DIAGNOSIS — L720 Epidermal cyst: Secondary | ICD-10-CM | POA: Diagnosis not present

## 2021-04-22 DIAGNOSIS — L821 Other seborrheic keratosis: Secondary | ICD-10-CM | POA: Diagnosis not present

## 2021-04-22 DIAGNOSIS — D225 Melanocytic nevi of trunk: Secondary | ICD-10-CM | POA: Diagnosis not present

## 2021-04-22 DIAGNOSIS — L853 Xerosis cutis: Secondary | ICD-10-CM | POA: Diagnosis not present

## 2021-04-23 ENCOUNTER — Encounter: Payer: Self-pay | Admitting: Speech Pathology

## 2021-04-23 ENCOUNTER — Ambulatory Visit: Payer: Medicare PPO | Admitting: Physical Therapy

## 2021-04-23 ENCOUNTER — Ambulatory Visit: Payer: Medicare PPO | Admitting: Occupational Therapy

## 2021-04-23 ENCOUNTER — Other Ambulatory Visit: Payer: Self-pay

## 2021-04-23 ENCOUNTER — Ambulatory Visit: Payer: Medicare PPO | Admitting: Speech Pathology

## 2021-04-23 ENCOUNTER — Encounter: Payer: Self-pay | Admitting: Occupational Therapy

## 2021-04-23 DIAGNOSIS — R1312 Dysphagia, oropharyngeal phase: Secondary | ICD-10-CM

## 2021-04-23 DIAGNOSIS — R293 Abnormal posture: Secondary | ICD-10-CM

## 2021-04-23 DIAGNOSIS — R41844 Frontal lobe and executive function deficit: Secondary | ICD-10-CM

## 2021-04-23 DIAGNOSIS — R41842 Visuospatial deficit: Secondary | ICD-10-CM | POA: Diagnosis not present

## 2021-04-23 DIAGNOSIS — R278 Other lack of coordination: Secondary | ICD-10-CM | POA: Diagnosis not present

## 2021-04-23 DIAGNOSIS — M6281 Muscle weakness (generalized): Secondary | ICD-10-CM | POA: Diagnosis not present

## 2021-04-23 DIAGNOSIS — R41840 Attention and concentration deficit: Secondary | ICD-10-CM | POA: Diagnosis not present

## 2021-04-23 DIAGNOSIS — R2689 Other abnormalities of gait and mobility: Secondary | ICD-10-CM

## 2021-04-23 DIAGNOSIS — R29818 Other symptoms and signs involving the nervous system: Secondary | ICD-10-CM | POA: Diagnosis not present

## 2021-04-23 NOTE — Therapy (Signed)
[facility] [address] [place], [place], [address] Phone: [phone]   Fax:  [phone]  Speech Language Pathology Treatment & Discharge Summary  Patient Details  Name: Pamela Alvarez MRN: [id number] Date of Birth: [birth year] Referring Provider (SLP): Dohmeier, Asencion Partridge, MD   Encounter Date: 04/23/2021   End of Session - 04/23/21 1520     Visit Number 5    Number of Visits 17    Date for SLP Re-Evaluation 05/30/21    Authorization Type [employer]    SLP Start Time 1400    SLP Stop Time  1275    SLP Time Calculation (min) 35 min    Activity Tolerance Patient tolerated treatment well;Patient limited by fatigue             Past Medical History:  Diagnosis Date   Cyst 07/2008   Cyst on back x2 that were drained   H/O echocardiogram 02/2000   Hx of colonic polyps    Hyperlipidemia    Hypertension    PSP (progressive supranuclear palsy) ([place])    TBI (traumatic brain injury)    Varicose veins of both lower extremities     Past Surgical History:  Procedure Laterality Date   POLYPECTOMY     Colon   TONSILLECTOMY     TUBAL LIGATION      There were no vitals filed for this visit.   Subjective Assessment - 04/23/21 1407     Subjective "PT and OT are wrapping up today"    Patient is accompained by: Family member   spouse Pamela Alvarez and Pamela Alvarez   Currently in Pain? No/denies                   ADULT SLP TREATMENT - 04/23/21 1413       General Information   Behavior/Cognition Alert;Cooperative;Distractible;Requires cueing      Treatment Provided   Treatment provided Dysphagia      Dysphagia Treatment   Temperature Spikes Noted No    Respiratory Status Room air    Oral Cavity - Dentition Adequate natural dentition    Treatment Methods Skilled observation;Compensation strategy training;Patient/caregiver education    Patient observed directly with PO's Yes    Type of PO's observed Regular;Thin  liquids    Feeding Able to feed self;Needs assist    Liquids provided via Straw    Pharyngeal Phase Signs & Symptoms --   none today, suspect chronic silentt aspiration   Amount of cueing Minimal    Other treatment/comments --   Pamela Alvarez, their full titme regular caregiver returns after a family death. Trained and educated Pamela Alvarez on safe swalow strategies, reducicng risk of aspiration pna with oral care, exercises and allowing self feeding. Handout provided.     Assessment / Recommendations / Plan   Plan Discharge SLP treatment due to (comment)      Dysphagia Recommendations   Diet recommendations Dysphagia 3 (mechanical soft);Thin liquid    Liquids provided via Straw    Medication Administration Crushed with puree    Supervision Patient able to self feed;Intermittent supervision to cue for compensatory strategies;Staff to assist with self feeding    Compensations Slow rate;Small sips/bites;Check for pocketing    Postural Changes and/or Swallow Maneuvers Out of bed for meals;Upright 30-60 min after meal      Progression Toward Goals   Progression toward goals Goals met, education completed, patient discharged from SLP              SLP  Education - 04/23/21 1446     Education Details s/s of aspiration pna, progression of PSP and decline of swallow, future course of ST will be diet downgrade likely,    Person(s) Educated Patient;Spouse;Caregiver(s)    Methods Explanation;Demonstration;Verbal cues;Handout    Comprehension Verbalized understanding;Returned demonstration            SPEECH THERAPY DISCHARGE SUMMARY  Visits from Start of Care: 5  Current functional level related to goals / functional outcomes: See goals below   Remaining deficits: Oropharyngeal dysphagia   Education / Equipment: S/s of apsiration pna, safe swallow precautions, strategies to reduce risk of aspiration pna, diet recommendations   Patient agrees to discharge. Patient goals were met. Patient is being  discharged due to meeting the stated rehab goals.Marland Kitchen     SLP Short Term Goals - 04/23/21 1519       SLP SHORT TERM GOAL #1   Title Family and caregiver will carryover 2 swallow precautions when feeding pt with occasional min A    Time 3    Period Weeks    Status Achieved      SLP SHORT TERM GOAL #2   Title Family/caregiver will follow 2 diet modficiations for meals and meds to reduce risk of aspiration    Time 4    Period Weeks    Status Achieved      SLP SHORT TERM GOAL #3   Title Pt and caregiver will verbalize s/s of aspiration pna with mod I    Time 3    Period Weeks    Status Achieved      SLP SHORT TERM GOAL #4   Title Pt will complete MBSS if indicated    Time 4    Period Weeks    Status Deferred              SLP Long Term Goals - 04/23/21 1519       SLP LONG TERM GOAL #1   Title Pt/caregiver will follow swallow precautions with mod I    Time 7    Period Weeks    Status Achieved      SLP LONG TERM GOAL #2   Title Caregivers will follow diet modifications with mod I    Time 7    Period Weeks    Status Achieved              Plan - 04/23/21 1447     Clinical Impression Statement Oropharyngeal dysphagia persists. Suspect chronic silent aspiration, however lungs remain CTA per sposue report.Marland Kitchen Spouse and care attendants educated re: strategies to keep Pamela Alvarez's lungs clear and minimize risk of aspiration pna. Spoke with Pamela Alvarez today re: PSP and swallow decline. Education provided re: maintaining her current diet for quality of life and that as swallow declines to where she is not swallowing, TF will not prolong life nor protect from aspiration pna and has risk of infection. Pamela Alvarez verbalized understanding. He is also aware that should her swallow decline, the next course of ST will likely involve diet downgrade. Education complete, goals met. D/C ST at this time    Speech Therapy Frequency 2x / week    Treatment/Interventions SLP instruction and  feedback;Compensatory strategies;Patient/family education;Multimodal communcation approach;Cueing hierarchy;Language facilitation;Internal/external aids;Environmental controls;Diet toleration management by SLP;Aspiration precaution training;Other (comment)    Potential to Achieve Goals Fair    Potential Considerations Severity of impairments;Previous level of function;Medical prognosis             Patient will benefit from  skilled therapeutic intervention in order to improve the following deficits and impairments:   Dysphagia, oropharyngeal phase    Problem List Patient Active Problem List   Diagnosis Date Noted   PSP (progressive supranuclear palsy) ([place]) 02/12/2021   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[place]) 02/12/2021   Unable to ambulate 02/12/2020   Progressive supranuclear palsy ([place]) 10/12/2019   Encephalomalacia on imaging study 10/12/2019   Fluency disorder associated with underlying disease 02/08/2018   Cognitive and neurobehavioral dysfunction 02/08/2018   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[place]) 02/08/2018   Arthritis of carpometacarpal (CMC) joint of left thumb 01/24/2018   Ankle fracture, left 09/03/2016   Hand dysfunction 03/11/2016   Symptomatic varicose veins, bilateral 03/11/2016   Numbness of left thumb 03/11/2016   Elevated BP 07/27/2014   Bunion of left foot 07/26/2014   Abnormality of gait 07/26/2014   Visit for preventive health examination 07/28/2013   Right anterior knee pain 07/22/2012   Medicare annual wellness visit, subsequent 07/25/2011   ANXIETY, SITUATIONAL 09/25/2009   ARTHRITIS 09/25/2009   Vitamin D deficiency 09/27/2008   Hyperlipidemia 09/17/2008   Elevated blood pressure reading without diagnosis of hypertension 09/17/2008   COLONIC POLYPS, HX OF 09/17/2008   PERSONAL HISTORY DISEASES SKIN&SUBCUT TISSUE 09/17/2008    Anden Bartolo, Annye Rusk, [place] 04/23/2021, 3:20 PM  [facility] [address] [place], [place], [address] Phone: [phone]   Fax:  [phone]   Name: Pamela Alvarez MRN: [id number] Date of Birth: 1940/12/13

## 2021-04-23 NOTE — Patient Instructions (Addendum)
Oral care, mobile, self feeding when able  Let Palliative Care know that the ST says Pamela Alvarez is likely aspirating. Do they feel like they need to come out twice a month to listen to her lungs  Signs of Aspiration Pneumonia   Chest pain/tightness Fever (can be low grade) Cough  With foul-smelling phlegm (sputum) With sputum containing pus or blood With greenish sputum Fatigue  Shortness of breath  Wheezing   **IF YOU HAVE THESE SIGNS, CONTACT YOUR DOCTOR OR GO TO THE EMERGENCY DEPARTMENT OR URGENT CARE AS SOON AS POSSIBLE**   The next step will likely be modifying her diet if swallow declines   When the swallowing declines to not being able to swallow, the entire digestive system is likely shutting down. A tube feeding would not prolong life and has other risks and complications such as infection, reflux and aspiration from reflux

## 2021-04-23 NOTE — Therapy (Addendum)
[facility] [address] [place], [place], [address] Phone: [phone]   Fax:  [phone]  Physical Therapy Treatment/Discharge Summary  Patient Details  Name: Pamela Alvarez MRN: [id number] Date of Birth: 1940/11/19 Referring Provider (PT): Asencion Partridge Dohmeier   Encounter Date: 04/23/2021   PT End of Session - 04/23/21 1448     Visit Number 6    Number of Visits 9    Authorization Type [employer] medicare- 9 visits 1/25-2/24/23    Authorization - Visit Number 6    Authorization - Number of Visits 9    PT Start Time 1583    PT Stop Time 0940   full time not used to D/C visit, pt fatigued.   PT Time Calculation (min) 26 min    Activity Tolerance Patient tolerated treatment well;Patient limited by fatigue             Past Medical History:  Diagnosis Date   Cyst 07/2008   Cyst on back x2 that were drained   H/O echocardiogram 02/2000   Hx of colonic polyps    Hyperlipidemia    Hypertension    PSP (progressive supranuclear palsy) ([facility])    TBI (traumatic brain injury)    Varicose veins of both lower extremities     Past Surgical History:  Procedure Laterality Date   POLYPECTOMY     Colon   TONSILLECTOMY     TUBAL LIGATION      There were no vitals filed for this visit.   Subjective Assessment - 04/23/21 1449     Subjective Husband reports pt is being DC'd from other discplines today and feels as though pt has reached her max potential in therapy. Husband reports PT being helpful and feeling as though he is fully prepared to take care of pt at home. Pt walking at home and exercises are going well.    Patient is accompained by: Family member   husband and care aide   Patient Stated Goals To try to maintain mobility as much as possible.    Currently in Pain? No/denies    Pain Onset More than a month ago                Pt emotionally labile throughout session, specifically while husband discussing her  current functional mobility. Emphasis of session on family education regarding transfers and bed mobility for proper body mechanics, energy conservation and safety as well as reviewing HEP. Husband performed sit <>stand pivot from [facility] to mat holding onto pt's posterior arms and upper back. Husband reported difficulty cueing pt to shift weight anteriorly, especially when performing supine <>sit in the morning. Reviewed technique of hand on upper back to help provide a tactile cue to come forward. Reviewed also having pt's feet back under her. Provided max verbal and tactile cues for assistance w/cueing and practiced sit <>supine transfer using log roll technique x3 w/husband for improved body mechanics, sequencing and cues. Education provided on facilitating sit <>stand transfer and husband practiced x3 from low mat using handles on personal gait belt. Practiced using various hand placements and cues for anterior weight shift. Husband performed sit <>stand pivot from mat to [facility] using good body mechanics and cues to facilitate proper weight shift. Pt's husband and caregiver in agreement to D/C early today due to improvements and incr ease of transfer/bed mobility as pt has reached max potential with OPPT.              PHYSICAL THERAPY DISCHARGE  SUMMARY  Visits from Start of Care: 6  Current functional level related to goals / functional outcomes: See clinical impression statement/LTGs   Remaining deficits: Cognitive impairment, postural abnormalities, impairments with gait/transfers, rigidity, weakness.    Education / Equipment: HEP for ROM, caregiver education for transfers/bed mobility.    Patient agrees to discharge. Patient goals were met. Patient is being discharged due to maximized rehab potential. Pt's husband and caregiver feel comfortable with bed mobility/transfers/gait to perform with patient at home.             PT Short Term Goals - 04/03/21 0837       PT SHORT TERM  GOAL #1   Title STGs=LTGs               PT Long Term Goals - 04/23/21 1519       PT LONG TERM GOAL #1   Title Pt's caregivers will be instructed in proper transfer training techniques for improved safety and be able to demonstrate.    Baseline transfer training with sit <> stands and bed mobility    Time 4    Period Weeks    Status Achieved    Target Date 05/02/21      PT LONG TERM GOAL #2   Title Pt will be able to walk 25' with bilateral HHA min assist of caregivers to continue to walk short household distances to maintain mobility.    Baseline 04/16/21 60' min assist with husband    Time 4    Period Weeks    Status Achieved    Target Date 05/02/21      PT LONG TERM GOAL #3   Title Pt will be able to maintain standing x 2 min with RUE support on bar close supervision for improved ADLs.    Baseline pt more fatigued today, did not work on standing tolerance.    Time 4    Period Weeks    Status Deferred    Target Date 05/02/21                   Plan - 04/23/21 1513     Clinical Impression Statement Continued to work on sit <> stand transfers with incr forward lean for pt to help assist. Husband showing good technique and understanding for home. Also worked on bed mobility for supine <> sit with use of log roll technique instead of pt just leaning back and being extended in a rigid posture while pt's husband turned her. Pt able to help bring legs up onto bed with log roll technique and husband demonstrating proper technique and reporting incr ease. Session ended early today due to pt being fatigued after other therapies. Pt's husband and pt's caregiver able to verbalize and demo understanding transfer/gait/bed mobility technique and are in agreement to D/C today due to pt meeting her maximum potential with therapy.    Personal Factors and Comorbidities Comorbidity 3+;Behavior Pattern;Time since onset of injury/illness/exacerbation;Age;Past/Current Experience     Comorbidities depression with anxiety, PSP diagnosed 3 years ago, hyperlipidemia    Examination-Activity Limitations Locomotion Level;Transfers;Bed Mobility;Stand    Examination-Participation Restrictions Community Activity;Interpersonal Relationship    Stability/Clinical Decision Making Evolving/Moderate complexity    Rehab Potential Fair    PT Frequency 2x / week   plus eval   PT Duration 4 weeks    PT Treatment/Interventions ADLs/Self Care Home Management;Gait training;Functional mobility training;Therapeutic activities;Therapeutic exercise;Balance training;Neuromuscular re-education;Patient/family education;DME Instruction    PT Next Visit Plan --    Consulted  and Agree with Plan of Care Patient;Family member/caregiver    Family Member Consulted Husband, Chuck and caregiver, [place]             Patient will benefit from skilled therapeutic intervention in order to improve the following deficits and impairments:  Abnormal gait, Decreased cognition, Decreased mobility, Decreased strength, Postural dysfunction, Decreased balance, Decreased knowledge of use of DME, Decreased range of motion  Visit Diagnosis: Other abnormalities of gait and mobility  Muscle weakness (generalized)     Problem List Patient Active Problem List   Diagnosis Date Noted   PSP (progressive supranuclear palsy) ([facility]) 02/12/2021   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[facility]) 02/12/2021   Unable to ambulate 02/12/2020   Progressive supranuclear palsy ([facility]) 10/12/2019   Encephalomalacia on imaging study 10/12/2019   Fluency disorder associated with underlying disease 02/08/2018   Cognitive and neurobehavioral dysfunction 02/08/2018   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[facility]) 02/08/2018   Arthritis of carpometacarpal (CMC) joint of left thumb 01/24/2018   Ankle fracture, left 09/03/2016   Hand dysfunction 03/11/2016   Symptomatic varicose veins, bilateral 03/11/2016   Numbness of left thumb 03/11/2016   Elevated BP 07/27/2014    Bunion of left foot 07/26/2014   Abnormality of gait 07/26/2014   Visit for preventive health examination 07/28/2013   Right anterior knee pain 07/22/2012   Medicare annual wellness visit, subsequent 07/25/2011   ANXIETY, SITUATIONAL 09/25/2009   ARTHRITIS 09/25/2009   Vitamin D deficiency 09/27/2008   Hyperlipidemia 09/17/2008   Elevated blood pressure reading without diagnosis of hypertension 09/17/2008   COLONIC POLYPS, HX OF 09/17/2008   PERSONAL HISTORY DISEASES SKIN&SUBCUT TISSUE 09/17/2008    Arliss Journey, PT, DPT  04/23/2021, 3:27 PM  [facility] [address] [place], [place], [address] Phone: [phone]   Fax:  [phone]  Name: Pamela Alvarez MRN: [id number] Date of Birth: [birth year]

## 2021-04-23 NOTE — Patient Instructions (Signed)
SHOULDER: Flexion On Table    Place hands on table, bend and straighten elbows  Press hands down into table. Forwards and backwards and sides to side .Hold __5_ seconds. 10___ reps per set, _1__ sets per day, _7__ days per week   Copyright  VHI. All rights reserved.

## 2021-04-24 ENCOUNTER — Ambulatory Visit: Payer: Medicare PPO | Admitting: Speech Pathology

## 2021-04-24 ENCOUNTER — Ambulatory Visit: Payer: Medicare PPO

## 2021-04-24 ENCOUNTER — Ambulatory Visit: Payer: Medicare PPO | Admitting: Occupational Therapy

## 2021-04-24 NOTE — Progress Notes (Signed)
Office Note     CC: Left lower extremity DVT Requesting Provider:  Burnis Medin, MD  HPI: Pamela Alvarez is a 81 y.o. (30-Sep-1940) female presenting at the request of .Panosh, Standley Brooking, MD for left lower extremity swelling with known chronic DVT.  Pamela Alvarez has a history of progressive supranuclear palsy who is cared for at home by her spouse with the assistance of a caregiver named Rosa. Spouse and caregiver states that pt mostly nonverbal, but able to say "yes" or "no".  Pamela Alvarez, her spouse, stated she requires a 2 person assist to ambulate short distances to the bathroom or kitchen, does not get up on her own.  She has to be bathed and dressed which he typically does twice per week but does daily pericare.   On exam today, Pamela Alvarez appeared comfortable.  She was accompanied by her husband.  DVT was first appreciated in 2021, and since, she has been on Eliquis.  She had another DVT study done in 2023 demonstrating some progression in DVT with partial chronic thrombus appreciated in the right common femoral as well, however all thrombus was chronic.  Pamela Alvarez's major concern was whether standard should be switched to another medication and if any therapy could be offered for her clot removal.  Pamela Alvarez denied wounds on her feet.  She denied pain in her legs at rest.  Past Medical History:  Diagnosis Date   Cyst 07/2008   Cyst on back x2 that were drained   H/O echocardiogram 02/2000   Hx of colonic polyps    Hyperlipidemia    Hypertension    PSP (progressive supranuclear palsy) ([facility])    TBI (traumatic brain injury)    Varicose veins of both lower extremities     Past Surgical History:  Procedure Laterality Date   POLYPECTOMY     Colon   TONSILLECTOMY     TUBAL LIGATION      Social History   Socioeconomic History   Marital status: Married    Spouse name: Psychologist, prison and probation services   Number of children: 2   Years of education: Not on file   Highest education level: Doctorate  Occupational History    Occupation: retired    Comment: Teaching laboratory technician [name]  Tobacco Use   Smoking status: Never   Smokeless tobacco: Never  Scientific laboratory technician Use: Never used  Substance and Sexual Activity   Alcohol use: Not Currently    Alcohol/week: 7.0 standard drinks    Types: 7 Glasses of wine per week    Comment: one a day   Drug use: Not Currently   Sexual activity: Not on file  Other Topics Concern   Not on file  Social History Narrative   Regular exercise-yes   Moved back to [place] from [place] of 2 cat   G3 P2    Exercises walking regu;arly    Retired  Teaching laboratory technician   Drinks one cup of coffee  A day. In addition to walking QD she and her husband take a pilates class on Thursdays. She lives with her husband in a 2 story house, though the master bedroom in on the first level.       Social Determinants of Health   Financial Resource Strain: Low Risk    Difficulty of Paying Living Expenses: Not hard at all  Food Insecurity: No Food Insecurity   Worried About Charity fundraiser in the Last Year: Never true   [place] in the  Last Year: Never true  Transportation Needs: No Transportation Needs   Lack of Transportation (Medical): No   Lack of Transportation (Non-Medical): No  Physical Activity: Inactive   Days of Exercise per Week: 0 days   Minutes of Exercise per Session: 0 min  Stress: No Stress Concern Present   Feeling of Stress : Not at all  Social Connections: Not on file  Intimate Partner Violence: Not on file    Family History  Problem Relation Age of Onset   Hypertension Mother    Parkinsonism Mother    Angina Father    Heart disease Father    Colon cancer Father    Lung cancer Father    Skin cancer Father    Cancer Father        Heart   Breast cancer Sister        over 21   Diabetes Sister    Heart disease Brother 68       triple bypass surgery   Macular degeneration Sister    Diabetes Sister        prediabetic   Hearing loss Sister         hearing problems   Vasculitis Sister     Current Outpatient Medications  Medication Sig Dispense Refill   apixaban (ELIQUIS) 5 MG TABS tablet TAKE 1 TABLET BY MOUTH TWICE A DAY FOR DVT 60 tablet 4   atorvastatin (LIPITOR) 20 MG tablet Take 1 tablet (20 mg total) by mouth daily. 90 tablet 1   cefdinir (OMNICEF) 300 MG capsule Take 1 capsule (300 mg total) by mouth 2 (two) times daily. For uti 14 capsule 0   Cholecalciferol (VITAMIN D3 PO) Take 1 tablet by mouth daily.     citalopram (CELEXA) 10 MG/5ML suspension Take by mouth.     Famotidine (PEPCID PO) Take 10 mg by mouth at bedtime.     HYDROcodone-acetaminophen (NORCO) 5-325 MG tablet Take 2 tablets by mouth every 4 (four) hours as needed. 10 tablet 0   LORazepam (ATIVAN) 0.5 MG tablet Take 0.5 mg by mouth daily as needed. (Patient not taking: Reported on 04/11/2021)     losartan (COZAAR) 25 MG tablet TAKE 1 TABLET BY MOUTH EVERY DAY 90 tablet 0   vitamin B-12 (CYANOCOBALAMIN) 500 MCG tablet Take 500 mcg by mouth daily.     No current facility-administered medications for this visit.    Allergies  Allergen Reactions   Dust Mite Extract Cough   Sulfonamide Derivatives      REVIEW OF SYSTEMS:   [X]  denotes positive finding, [ ]  denotes negative finding Cardiac  Comments:  Chest pain or chest pressure:    Shortness of breath upon exertion:    Short of breath when lying flat:    Irregular heart rhythm:        Vascular    Pain in calf, thigh, or hip brought on by ambulation:    Pain in feet at night that wakes you up from your sleep:     Blood clot in your veins:    Leg swelling:         Pulmonary    Oxygen at home:    Productive cough:     Wheezing:         Neurologic    Sudden weakness in arms or legs:     Sudden numbness in arms or legs:     Sudden onset of difficulty speaking or slurred speech:    Temporary loss of vision in  one eye:     Problems with dizziness:         Gastrointestinal    Blood in stool:      Vomited blood:         Genitourinary    Burning when urinating:     Blood in urine:        Psychiatric    Major depression:         Hematologic    Bleeding problems:    Problems with blood clotting too easily:        Skin    Rashes or ulcers:        Constitutional    Fever or chills:      PHYSICAL EXAMINATION:  There were no vitals filed for this visit.  General:  WDWN in NAD; vital signs documented above Gait: Not observed HENT: WNL, normocephalic Pulmonary: normal non-labored breathing , without wheezing Cardiac: regular HR Abdomen: soft, NT, no masses Skin: without rashes Vascular Exam/Pulses:  Right Left  Radial 2+ (normal) 2+ (normal)  Ulnar 2+ (normal) 2+ (normal)  Femoral    Popliteal    DP 2+ (normal) 2+ (normal)       Extremities: without ischemic changes Musculoskeletal: no muscle wasting or atrophy  Neurologic: A&O X 3;  No focal weakness or paresthesias are detected Psychiatric:  The pt has Normal affect.   Non-Invasive Vascular Imaging:   Left:  - Color duplex evaluation of the left lower extremity shows there is  thrombus in the common femoral vein, saphero-femoral junction, proximal  greater saphenous vein, femoral vein and popliteal vein.  - Venous reflux is noted in the left popliteal vein.  - No significant change in chronic thrombus compared to study performed  one month prior.     ASSESSMENT/PLAN: Pamela Alvarez is a 81 y.o. female presenting with chronic, extensive left lower extremity deep venous thrombosis.  Her initial ultrasound in 2021 demonstrated no thrombus in the common femoral veins bilaterally.  Most recent formal duplex ultrasound in 2023 demonstrated chronic thrombus in both areas.  I had a long discussion with Pamela Alvarez and Pamela Alvarez and informed them that there was progression in her DVT at some point.  Imaging earlier this month and today confirms all thrombus appreciated was chronic.  In evaluating today's ultrasound, there was  no significant change in chronic thrombus compared to her previous study. She does have refluxing her superficial system, but has no wounds or skin changes usually seen in patient with severe, chronic venous insufficiency.  She continues to wear compression stocking on the left daily.  While Pamela Alvarez did have progression at some point over the last year and a half, all areas appear to be chronic at this point. Likely progression was immediately following the ultrasound prior to anticoagulation reaching a steady state. Unfortunately I am unable to gauge is the pt had any change is symptoms at a certain point as she is nonverbal. I had a long discussion with Pamela Alvarez that I would be happy to refer Pamela Alvarez to hematology to discuss changing her anticoagulation profile, as this is an easy change.  At this point, I do not think she is at a substantially increased risk by continuing on Eliquis.  I will inform Dr. Regis Bill regarding the above. I have placed a referral to hematology.    Broadus John, MD [facility] [phone]

## 2021-04-24 NOTE — Therapy (Signed)
[facility] [address] [place], [place], [address] Phone: [phone]   Fax:  [phone]  Occupational Therapy Treatment  Patient Details  Name: Pamela Alvarez MRN: [id number] Date of Birth: Feb 04, 1941 Referring Provider (OT): Dr. Brett Fairy   Encounter Date: 04/23/2021  OCCUPATIONAL THERAPY DISCHARGE SUMMARY   Current functional level related to goals / functional outcomes: Pt met goals and pt's husband and caregiver demonstrate understanding of education.   Remaining deficits: Decreased ROM, spasticity, cognitive deficits, decreased coordination, decreased functional mobility.   Education / Equipment: Pt's husband and caregiver were educated in ROM and light functional use of UE's. Pt's caregiver's verbalize  and demonstrate understanding of all education. Pt will require assist for any exercises due to cognitive deficits. Pt's husband agrees with plans for d/c.   Patient agrees to discharge. Patient goals were met. Patient is being discharged due to maximized rehab potential. .      OT End of Session - 04/24/21 1610     Visit Number 4    Number of Visits 11   anticipate pt will only need 3-6 visits   Date for OT Re-Evaluation 05/07/21    Authorization Type [employer]    Authorization Time Period 5 weeks    Authorization - Visit Number 4    Authorization - Number of Visits 10    Progress Note Due on Visit 10    OT Start Time 1320    OT Stop Time 1400    OT Time Calculation (min) 40 min    Activity Tolerance Patient tolerated treatment well    Behavior During Therapy Flat affect;WFL for tasks assessed/performed             Past Medical History:  Diagnosis Date   Cyst 07/2008   Cyst on back x2 that were drained   H/O echocardiogram 02/2000   Hx of colonic polyps    Hyperlipidemia    Hypertension    PSP (progressive supranuclear palsy) (HCC)    TBI (traumatic brain injury)    Varicose veins of  both lower extremities     Past Surgical History:  Procedure Laterality Date   POLYPECTOMY     Colon   TONSILLECTOMY     TUBAL LIGATION      There were no vitals filed for this visit.   Subjective Assessment - 04/24/21 1607     Pertinent History PSP    Limitations do not charge orthotic fit it is not covered by [facility]    Currently in Pain? Yes    Pain Score --   unable to rate   Pain Location Knee    Pain Orientation Right    Pain Descriptors / Indicators Aching    Pain Type Chronic pain    Pain Onset More than a month ago    Pain Frequency Intermittent    Aggravating Factors  unknown    Pain Relieving Factors inactivity                                  OT Treatment/Education - 04/24/21 1612     Education Details Reviewed stretching HEP for BUEs Also recommended table slides for RUE (flex, horizontal abduction/adduction)  and handout provided and pt returned demo with min facilitation see pt instruction. Pt was  encouraged  to feed herself finger foods (at least initiate) and using RUE as much as possible for light functional tasks  Person(s) Educated Patient;Spouse;Caregiver(s)    Methods Explanation;Demonstration;Tactile cues;Verbal cues;Handout    Comprehension Verbalized understanding;Verbal cues required;Returned demonstration;Tactile cues required              OT Short Term Goals - 04/02/21 1032       OT SHORT TERM GOAL #1   Title no short term goals               OT Long Term Goals - 04/23/21 1349       OT LONG TERM GOAL #1   Title Pt's caregiver's will be I with HEP for UE ROM and  UE  functional use prn for increased ease with ADLs and decreasing caregiver burden    Time 5    Period Weeks    Status Achieved    Target Date 05/07/21      OT LONG TERM GOAL #2   Title Pt will tolerate 70* P/ROM  shoulder flexion, with no significant reports of pain  for increased ease with ADLs.    Baseline RUE grossly 60 shoulder  flexion P/ROM with reports of discomfort    Time 5    Period Weeks    Status Achieved   80*                  Plan - 04/24/21 1610     Clinical Impression Statement Pt and husband agree with plans for d/c today. Pt's husband and caregiver feel comfortable with ROM exercises.    OT Occupational Profile and History Detailed Assessment- Review of Records and additional review of physical, cognitive, psychosocial history related to current functional performance    Occupational performance deficits (Please refer to evaluation for details): ADL's;IADL's;Leisure;Social Participation    Body Structure / Function / Physical Skills ADL;Balance;Mobility;Strength;UE functional use;Tone;FMC;Flexibility;Coordination;Gait;Vision;Sensation;IADL;Dexterity;Decreased knowledge of use of DME;GMC;Decreased knowledge of precautions    Cognitive Skills Attention;Problem Solve;Safety Awareness;Sequencing;Thought;Understand    Rehab Potential Fair    Clinical Decision Making Several treatment options, min-mod task modification necessary    Comorbidities Affecting Occupational Performance: May have comorbidities impacting occupational performance    Modification or Assistance to Complete Evaluation  Min-Moderate modification of tasks or assist with assess necessary to complete eval    OT Frequency 2x / week    OT Duration --   5 weeks   OT Treatment/Interventions Self-care/ADL training;Therapeutic exercise;Splinting;Manual Therapy;Neuromuscular education;Ultrasound;Aquatic Therapy;Therapeutic activities;Paraffin;DME and/or AE instruction;Cognitive remediation/compensation;Visual/perceptual remediation/compensation;Gait Training;Fluidtherapy;Electrical Stimulation;Moist Heat;Contrast Bath;Passive range of motion;Patient/family education    Plan d/c OT    Consulted and Agree with Plan of Care Patient;Family member/caregiver   husband and caregiver [name]- caregiver is with pt 40 hs per week             Patient will benefit from skilled therapeutic intervention in order to improve the following deficits and impairments:   Body Structure / Function / Physical Skills: ADL, Balance, Mobility, Strength, UE functional use, Tone, FMC, Flexibility, Coordination, Gait, Vision, Sensation, IADL, Dexterity, Decreased knowledge of use of DME, GMC, Decreased knowledge of precautions Cognitive Skills: Attention, Problem Solve, Safety Awareness, Sequencing, Thought, Understand     Visit Diagnosis: Other abnormalities of gait and mobility  Muscle weakness (generalized)  Other symptoms and signs involving the nervous system  Other lack of coordination  Abnormal posture  Attention and concentration deficit  Frontal lobe and executive function deficit  Visuospatial deficit    Problem List Patient Active Problem List   Diagnosis Date Noted   PSP (progressive supranuclear palsy) ([facility]) 02/12/2021   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[facility]) 02/12/2021  Unable to ambulate 02/12/2020   Progressive supranuclear palsy ([facility]) 10/12/2019   Encephalomalacia on imaging study 10/12/2019   Fluency disorder associated with underlying disease 02/08/2018   Cognitive and neurobehavioral dysfunction 02/08/2018   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[facility]) 02/08/2018   Arthritis of carpometacarpal (CMC) joint of left thumb 01/24/2018   Ankle fracture, left 09/03/2016   Hand dysfunction 03/11/2016   Symptomatic varicose veins, bilateral 03/11/2016   Numbness of left thumb 03/11/2016   Elevated BP 07/27/2014   Bunion of left foot 07/26/2014   Abnormality of gait 07/26/2014   Visit for preventive health examination 07/28/2013   Right anterior knee pain 07/22/2012   Medicare annual wellness visit, subsequent 07/25/2011   ANXIETY, SITUATIONAL 09/25/2009   ARTHRITIS 09/25/2009   Vitamin D deficiency 09/27/2008   Hyperlipidemia 09/17/2008   Elevated blood pressure reading without diagnosis of hypertension 09/17/2008   COLONIC  POLYPS, HX OF 09/17/2008   PERSONAL HISTORY DISEASES SKIN&SUBCUT TISSUE 09/17/2008    Juleon Narang, OT 04/24/2021, 4:14 PM  [facility] [address] [place], [place], [address] Phone: [phone]   Fax:  [phone]  Name: Azrael Huss MRN: [id number] Date of Birth: 1940-10-22

## 2021-04-25 ENCOUNTER — Other Ambulatory Visit: Payer: Self-pay

## 2021-04-25 ENCOUNTER — Ambulatory Visit: Payer: Medicare PPO | Admitting: Vascular Surgery

## 2021-04-25 ENCOUNTER — Ambulatory Visit (HOSPITAL_COMMUNITY)
Admission: RE | Admit: 2021-04-25 | Discharge: 2021-04-25 | Disposition: A | Discharge status: Home / Self Care | Payer: Medicare PPO | Source: Ambulatory Visit | Attending: Vascular Surgery | Admitting: Vascular Surgery

## 2021-04-25 VITALS — BP 161/74 | HR 86 | Temp 98.0°F | Resp 20 | Ht 62.0 in | Wt 152.0 lb

## 2021-04-25 DIAGNOSIS — I82513 Chronic embolism and thrombosis of femoral vein, bilateral: Secondary | ICD-10-CM

## 2021-04-25 DIAGNOSIS — I872 Venous insufficiency (chronic) (peripheral): Secondary | ICD-10-CM | POA: Insufficient documentation

## 2021-04-30 ENCOUNTER — Ambulatory Visit: Payer: Medicare PPO | Admitting: Occupational Therapy

## 2021-04-30 ENCOUNTER — Ambulatory Visit: Payer: Medicare PPO

## 2021-04-30 ENCOUNTER — Ambulatory Visit: Payer: Medicare PPO | Admitting: Speech Pathology

## 2021-05-01 ENCOUNTER — Encounter: Payer: Medicare PPO | Admitting: Speech Pathology

## 2021-05-01 ENCOUNTER — Ambulatory Visit: Payer: Medicare PPO

## 2021-05-01 ENCOUNTER — Encounter: Payer: Medicare PPO | Admitting: Occupational Therapy

## 2021-05-02 ENCOUNTER — Telehealth: Payer: Self-pay | Admitting: *Deleted

## 2021-05-02 ENCOUNTER — Other Ambulatory Visit: Payer: Medicare PPO | Admitting: Family Medicine

## 2021-05-02 VITALS — BP 120/80 | HR 62 | Temp 97.6°F | Resp 18

## 2021-05-02 DIAGNOSIS — R195 Other fecal abnormalities: Secondary | ICD-10-CM

## 2021-05-02 DIAGNOSIS — Z515 Encounter for palliative care: Secondary | ICD-10-CM

## 2021-05-02 DIAGNOSIS — F418 Other specified anxiety disorders: Secondary | ICD-10-CM

## 2021-05-02 NOTE — Progress Notes (Signed)
[facility] Pall[facility] Consult Note Telephone: [phone]  Fax: [phone]    Date of encounter: 05/02/21 4:15 PM PATIENT NAME: Pamela Alvarez [place] [address]   [phone] (home)  DOB: 13-Nov-1940 MRN: [id number] PRIMARY CARE PROVIDER:    Burnis Medin, MD,  [place] [address] [phone]  REFERRING PROVIDER:   Burnis Medin, MD [address] [place],  [place] [address] [phone]  RESPONSIBLE PARTY:    Contact Information     Name Relation Home Work Mobile   [place] Spouse [phone]          I met face to face with patient, spouse and caregiver Basilia Jumbo in patient's home. Palliative Care was asked to follow this patient by consultation request of  Panosh, Standley Brooking, MD to address advance care planning and complex medical decision making. This is a follow up visit.                                   ASSESSMENT, SYMPTOM MANAGEMENT AND PLAN / RECOMMENDATIONS:   Palliative Care Encounter Provide support for spouse and paid caregiver, follow up in 3-4 weeks after pt has cataract surgery.   Depression with anxiety Spouse not giving Ativan, continue Celexa solution daily and monitoring.  3.    Abnormal stool Advised if pt develops severe abdominal pain, fever, nausea or vomiting to proceed to ER. Likely mucous in stool is relative to inflammation of colon from antibiotic therapy. No evidence of C diff.     Follow up Palliative Care Visit: Palliative care will continue to follow for complex medical decision making, advance care planning, and clarification of goals. Return 3-4 weeks or prn.   This visit was coded based on medical decision making (MDM).  PPS: 40%  HOSPICE ELIGIBILITY/DIAGNOSIS: TBD  Chief Complaint:  Pt with recent antibiotics for UTI, now having some passage of mucoid stool.  HISTORY OF PRESENT ILLNESS:  Pamela Alvarez is a 81 y.o. year old  female  with progressive supranuclear palsy, hx of TBI, arthritis and atypical parkinsonism.  Pt with recent UTI and completed antibiotics, now passing some mucoid stool.  Does not appear uncomfortable nor does caregiver [place] or spouse think she has been uncomfortable.  No reported fever, nausea or vomiting, bright red or dark tarry stools.  Spouse will be having cataracts done in March and will have [place] and caregivers remain until pt in bed for the evening. Spouse states that there has been no change in the size of chronic DVT. Spouse had requested medication at last visit to try to decrease evening anxiety for pt, was given 0.5 mg Ativan QHS but he states the first night he tried it that pt was sleepy until later the following day but also noted that she could not assist him in getting to the bathroom for toileting so he stopped medication.  She was also prescribed Celexa solution at that time.  History obtained from review of EMR, discussion with spouse and paid caregiver and/or Ms. Crespo.  I reviewed available labs, medications, imaging, studies and related documents from the EMR.  Records reviewed and summarized above.   ROS-information as per above from spouse/caregiver Pt only says "yes/no" sometimes in same answer.    Physical Exam: Current and past weights: Last weight as of 04/25/21 is 152 lbs, prior weight 01/17/20 150 lbs 9.6 ounces Constitutional: NAD General:  WD/WN female EYES: anicteric sclera, lids intact, no discharge  ENMT: intact hearing, oral mucous membranes moist, dentition intact CV: S1S2, RRR, 1+ RLE edema, trace LLE edema Pulmonary: CTAB, no increased work of breathing, no cough, room air Abdomen: normo-active BS + 4 quadrants, soft and non tender, no ascites GU: deferred MSK: no sarcopenia, moves all extremities, ambulatory with assistance Skin: warm and dry, no rashes or wounds on visible skin Neuro:  noted generalized weakness,  Psych: non-anxious affect, A and O to  name Hem/lymph/immuno: no widespread bruising Stool-smear of yellow/brown mucous on incontinence brief removed by spouse earlier with no visible blood   Thank you for the opportunity to participate in the care of Ms. Bunten.  The palliative care team will continue to follow. Please call our office at [phone] if we can be of additional assistance.   Marijo Conception, FNP   COVID-19 PATIENT SCREENING TOOL Asked and negative response unless otherwise noted:   Have you had symptoms of covid, tested positive or been in contact with someone with symptoms/positive test in the past 5-10 days? No

## 2021-05-02 NOTE — Telephone Encounter (Signed)
Received a call from patient's spouse, Pamela Alvarez. He reports that patient's bowel movements are almost completely mucus. He states that [name] is scheduled to come out next week, but wonders if this is something he should have addressed today. [name] NP has availability to visit with patient today so I scheduled a visit for her to see patient @3 :30p  per [facility] request. Husband is agreeable to this and very appreciative.

## 2021-05-05 ENCOUNTER — Other Ambulatory Visit: Payer: Self-pay

## 2021-05-06 ENCOUNTER — Other Ambulatory Visit: Payer: Medicare PPO | Admitting: Family Medicine

## 2021-05-11 ENCOUNTER — Encounter: Payer: Self-pay | Admitting: Family Medicine

## 2021-05-12 DIAGNOSIS — Z515 Encounter for palliative care: Secondary | ICD-10-CM | POA: Insufficient documentation

## 2021-05-12 DIAGNOSIS — R195 Other fecal abnormalities: Secondary | ICD-10-CM | POA: Insufficient documentation

## 2021-05-19 ENCOUNTER — Other Ambulatory Visit: Payer: Self-pay

## 2021-05-19 ENCOUNTER — Telehealth (INDEPENDENT_AMBULATORY_CARE_PROVIDER_SITE_OTHER): Payer: Medicare PPO | Admitting: Internal Medicine

## 2021-05-19 ENCOUNTER — Telehealth: Payer: Self-pay | Admitting: Internal Medicine

## 2021-05-19 ENCOUNTER — Encounter: Payer: Self-pay | Admitting: Internal Medicine

## 2021-05-19 VITALS — BP 154/73 | HR 83 | Temp 98.0°F

## 2021-05-19 DIAGNOSIS — Z7901 Long term (current) use of anticoagulants: Secondary | ICD-10-CM

## 2021-05-19 DIAGNOSIS — Z87440 Personal history of urinary (tract) infections: Secondary | ICD-10-CM

## 2021-05-19 DIAGNOSIS — Z79899 Other long term (current) drug therapy: Secondary | ICD-10-CM

## 2021-05-19 DIAGNOSIS — R339 Retention of urine, unspecified: Secondary | ICD-10-CM

## 2021-05-19 DIAGNOSIS — R3989 Other symptoms and signs involving the genitourinary system: Secondary | ICD-10-CM | POA: Diagnosis not present

## 2021-05-19 DIAGNOSIS — I825Y2 Chronic embolism and thrombosis of unspecified deep veins of left proximal lower extremity: Secondary | ICD-10-CM

## 2021-05-19 DIAGNOSIS — G231 Progressive supranuclear ophthalmoplegia [Steele-Richardson-Olszewski]: Secondary | ICD-10-CM | POA: Diagnosis not present

## 2021-05-19 LAB — POCT URINALYSIS DIPSTICK
Bilirubin, UA: POSITIVE
Blood, UA: NEGATIVE
Glucose, UA: NEGATIVE
Ketones, UA: NEGATIVE
Nitrite, UA: POSITIVE
Spec Grav, UA: >=1.03 — AB (ref 1.010–1.025)
pH, UA: 6 (ref 5.0–8.0)

## 2021-05-19 MED ORDER — CEFDINIR 300 MG PO CAPS: 300.0000 mg | ORAL_CAPSULE | Freq: Two times a day (BID) | ORAL | 0 refills | Status: DC

## 2021-05-19 NOTE — Telephone Encounter (Signed)
Pt husband call and stated he want to know if he can bring in a urine specimen for pt and want to know will dr.panosh put in a order and want a call back. ?

## 2021-05-19 NOTE — Progress Notes (Signed)
?Virtual Visit via Video Note ? ?I connected with Pamela Alvarez on 05/19/21 at  3:00 PM EDT by a video enabled telemedicine application and verified that I am speaking with the correct person using two identifiers. ?Location patient: home ?Location provider:work office  ?Persons participating in the virtual visit: patient, provider spouse  patient is non verbal for conversation ? ?[place] national recommendations  regarding COVID 19 pandemic   video visit is advised over in office visit for this patient.  ?Patient aware  of the limitations of evaluation and management by telemedicine and  availability of in person appointments. and agreed to proceed. ? ? ?HPI: ?Pamela Alvarez presents for video visit ? ?Spouse noted for the last few days she was not urinating as much would go 11 to 16 hours when began taking Azo was able to go more frequently did seem a little uncomfortable battles constipation off and on. ? ?See last UTI early February with Citrobacter after 3 to 4 days on antibiotics she was much better back to her self smiling and comfortable. ? ?No associated known fevers or other systemic symptoms. ? ?ROS: See pertinent positives and negatives per HPI. ? ?Past Medical History:  ?Diagnosis Date  ? Cyst 07/2008  ? Cyst on back x2 that were drained  ? H/O echocardiogram 02/2000  ? Hx of colonic polyps   ? Hyperlipidemia   ? Hypertension   ? PSP (progressive supranuclear palsy) ([facility])   ? TBI (traumatic brain injury)   ? Varicose veins of both lower extremities   ? ? ?Past Surgical History:  ?Procedure Laterality Date  ? POLYPECTOMY    ? Colon  ? TONSILLECTOMY    ? TUBAL LIGATION    ? ? ?Family History  ?Problem Relation Age of Onset  ? Hypertension Mother   ? Parkinsonism Mother   ? Angina Father   ? Heart disease Father   ? Colon cancer Father   ? Lung cancer Father   ? Skin cancer Father   ? Cancer Father   ?     Heart  ? Breast cancer Sister   ?     over 50  ? Diabetes Sister   ? Heart disease Brother 67  ?     triple  bypass surgery  ? Macular degeneration Sister   ? Diabetes Sister   ?     prediabetic  ? Hearing loss Sister   ?     hearing problems  ? Vasculitis Sister   ? ? ?Social History  ? ?Tobacco Use  ? Smoking status: Never  ? Smokeless tobacco: Never  ?Vaping Use  ? Vaping Use: Never used  ?Substance Use Topics  ? Alcohol use: Not Currently  ?  Alcohol/week: 7.0 standard drinks  ?  Types: 7 Glasses of wine per week  ?  Comment: one a day  ? Drug use: Not Currently  ? ? ? ? ?Current Outpatient Medications:  ?  apixaban (ELIQUIS) 5 MG TABS tablet, TAKE 1 TABLET BY MOUTH TWICE A DAY FOR DVT, Disp: 60 tablet, Rfl: 4 ?  atorvastatin (LIPITOR) 20 MG tablet, Take 1 tablet (20 mg total) by mouth daily., Disp: 90 tablet, Rfl: 1 ?  Cholecalciferol (VITAMIN D3 PO), Take 1 tablet by mouth daily., Disp: , Rfl:  ?  citalopram (CELEXA) 10 MG/5ML suspension, Take by mouth., Disp: , Rfl:  ?  Famotidine (PEPCID PO), Take 10 mg by mouth at bedtime., Disp: , Rfl:  ?  HYDROcodone-acetaminophen (NORCO) 5-325 MG tablet,  Take 2 tablets by mouth every 4 (four) hours as needed., Disp: 10 tablet, Rfl: 0 ?  LORazepam (ATIVAN) 0.5 MG tablet, Take 0.5 mg by mouth daily as needed., Disp: , Rfl:  ?  losartan (COZAAR) 25 MG tablet, TAKE 1 TABLET BY MOUTH EVERY DAY, Disp: 90 tablet, Rfl: 0 ?  vitamin B-12 (CYANOCOBALAMIN) 500 MCG tablet, Take 500 mcg by mouth daily., Disp: , Rfl:  ?  cefdinir (OMNICEF) 300 MG capsule, Take 1 capsule (300 mg total) by mouth 2 (two) times daily. For uti, Disp: 14 capsule, Rfl: 0 ? ?EXAM: ?BP Readings from Last 3 Encounters:  ?05/19/21 (!) 154/73  ?05/02/21 120/80  ?04/25/21 (!) 161/74  ? ? ?VITALS per patient if applicable: ? ?GENERAL: alert, oriented, appears well and in no acute distress ? ?HEENT: atraumatic, conjunttiva clear, no obvious abnormalities on inspection of external nose and ears ? ?NECK: normal movements of the head and neck ? ?LUNGS: on inspection no signs of respiratory distress, breathing rate appears  normal, no obvious gross SOB, gasping or wheezing ? ?CV: no obvious cyanosis ? ?MS: moves all visible extremities without noticeable abnormality ? ?PSYCH/NEURO: pleasant and cooperative, no obvious depression or anxiety, speech and thought processing grossly intact ?Lab Results  ?Component Value Date  ? WBC 9.4 03/09/2021  ? HGB 13.4 03/09/2021  ? HCT 41.5 03/09/2021  ? PLT 275 03/09/2021  ? GLUCOSE 128 (H) 03/09/2021  ? CHOL 222 (H) 12/25/2020  ? TRIG 85.0 12/25/2020  ? HDL 75.60 12/25/2020  ? LDLDIRECT 191.8 07/22/2012  ? LDLCALC 129 (H) 12/25/2020  ? ALT 33 03/09/2021  ? AST 30 03/09/2021  ? NA 141 03/09/2021  ? K 3.8 03/09/2021  ? CL 108 03/09/2021  ? CREATININE 1.05 (H) 03/09/2021  ? BUN 21 03/09/2021  ? CO2 26 03/09/2021  ? TSH 3.27 12/25/2020  ? HGBA1C 5.3 12/25/2020  ?Urinalysis ?   ?Component Value Date/Time  ? COLORURINE YELLOW 03/10/2021 0042  ? APPEARANCEUR CLEAR 03/10/2021 0042  ? LABSPEC 1.033 (H) 03/10/2021 0042  ? PHURINE 5.5 03/10/2021 0042  ? GLUCOSEU NEGATIVE 03/10/2021 0042  ? HGBUR SMALL (A) 03/10/2021 0042  ? BILIRUBINUR positive 05/19/2021 1451  ? KETONESUR 15 (A) 03/10/2021 0042  ? PROTEINUR Positive (A) 04/11/2021 1603  ? PROTEINUR TRACE (A) 03/10/2021 0042  ? UROBILINOGEN 0.2 04/11/2021 1603  ? NITRITE positive 05/19/2021 1451  ? NITRITE NEGATIVE 03/10/2021 0042  ? LEUKOCYTESUR Small (1+) (A) 05/19/2021 1451  ? LEUKOCYTESUR TRACE (A) 03/10/2021 0042  ? ? ? ? ?ASSESSMENT AND PLAN: ? ?Discussed the following assessment and plan: ? ?  ICD-10-CM   ?1. Suspected UTI  R39.89 POC Urinalysis Dipstick  ?  Urine Culture  ?  ?2. Medication management  Z79.899   ?  ?3. Chronic deep vein thrombosis (DVT) of proximal vein of left lower extremity (HCC)  I82.5Y2   ?  ?4. Chronic anticoagulation  Z79.01   ?  ?5. Progressive supranuclear palsy (HCC)  G23.1   ?  ? ?Was able to tolerate Omnicef last treatment this appears to be a whole new symptom complex.  Recurrent as opposed to persistent. ?Refill Omnicef  at this time awaiting culture results if not significantly improved in 2 to 3 days on antibiotics contact us for advice. ?Okay to take the Azo for comfort for short while. ? ?In result in respect to the chronic DVT not worsening at this time remain on same anticoagulant consider hematology input as to diagnosis of an precipitated DVT that is  turned chronic on anticoagulation.  At this time remain on same medication. ?Counseled.  ?   ?Advised to call back or seek an in-person evaluation if worsening  or having  further concerns  in interim.  As indicated ?Return if symptoms worsen or fail to improve, for depending on results. ? ? ? ?[name], MD  ?

## 2021-05-19 NOTE — Telephone Encounter (Signed)
Patient's symptoms  ?Not urinating for 16 hours at a time, painful, husband thinks she holding it and is giving patient Azo to urinate. ?

## 2021-05-19 NOTE — Telephone Encounter (Signed)
Please document history symptoms ets  ? ?Provide them with a sterile urine cup to get urine specimen for urine and culture and then video visit virtual. ?Dx suspected UTI  ?

## 2021-05-20 LAB — URINE CULTURE
MICRO NUMBER:: 13122501
Result:: NO GROWTH
SPECIMEN QUALITY:: ADEQUATE

## 2021-05-21 NOTE — Progress Notes (Signed)
Surprisingly  urine showed no bacterial growth.  ?Please give an update  of how she is doing.

## 2021-05-23 ENCOUNTER — Telehealth: Payer: Self-pay | Admitting: Internal Medicine

## 2021-05-23 DIAGNOSIS — Z87440 Personal history of urinary (tract) infections: Secondary | ICD-10-CM

## 2021-05-23 NOTE — Telephone Encounter (Signed)
Patient husband called in asking if Dr.Panosh informed anyone that patient should stop taking antibiotics since she didn't have an UTI. Leonette Most stated that he is going to stop the antibiotics and is requesting a referral to an urologist.  ? ?Please advise. ?

## 2021-05-23 NOTE — Telephone Encounter (Signed)
Husband advised that pt does not need to continue abx use is culture showed no growth. ? ?Pt continues to be concerned that pt only urinates 3x/day; AZO needed b/c of burning symptoms; appears to be urinating normal amounts. Husband requesting urology referral.  ?Last VV 05/19/21 notes:  ?Advised to call back or seek an in-person evaluation if worsening  or having  further concerns  in interim.  As indicated ?Return if symptoms worsen or fail to improve, for depending on results. ? ?Will send husband request to Dr Fabian Sharp for review; husband aware PCP will review when she returns to office on Monday. ?

## 2021-05-25 NOTE — Telephone Encounter (Signed)
I agree please  make urology referral for urinary difficulties and hx of utis  and  progressive  nuclear palsy.  ?

## 2021-05-26 ENCOUNTER — Other Ambulatory Visit: Payer: Medicare PPO | Admitting: Family Medicine

## 2021-05-26 ENCOUNTER — Encounter: Payer: Self-pay | Admitting: Family Medicine

## 2021-05-26 ENCOUNTER — Other Ambulatory Visit: Payer: Self-pay

## 2021-05-26 VITALS — BP 130/70 | HR 85 | Temp 97.9°F | Resp 16

## 2021-05-26 DIAGNOSIS — K5900 Constipation, unspecified: Secondary | ICD-10-CM

## 2021-05-26 DIAGNOSIS — Z515 Encounter for palliative care: Secondary | ICD-10-CM

## 2021-05-26 DIAGNOSIS — R39198 Other difficulties with micturition: Secondary | ICD-10-CM

## 2021-05-26 DIAGNOSIS — F418 Other specified anxiety disorders: Secondary | ICD-10-CM

## 2021-05-26 NOTE — Progress Notes (Signed)
I reordered referral to urology  as urgent  also can take azo  in the interim

## 2021-05-26 NOTE — Addendum Note (Signed)
Addended by: Mary Sella D on: 05/26/2021 10:00 AM ? ? Modules accepted: Orders ? ?

## 2021-05-26 NOTE — Addendum Note (Signed)
Addended byMadelin Headings on: 05/26/2021 04:42 PM ? ? Modules accepted: Orders ? ?

## 2021-05-26 NOTE — Progress Notes (Signed)
? ? ?[facility] ?Community Palliative Care Consult Note ?Telephone: [phone]  ?Fax: [phone]  ? ? ?Date of encounter: 05/26/21 ?4:15 PM ?PATIENT NAME: Brehanna Mier ?[address] ?[place] [address]   ?[phone] (home)  ?DOB: 07/24/1940 ?MRN: [id number] ?PRIMARY CARE PROVIDER:    ?Panosh, [name], MD,  ?[address] ?[place] [address] ?[phone] ? ?REFERRING PROVIDER:   ?Panosh, [name], MD ?[address] ?[place],  [place] [address] ?[phone] ? ?RESPONSIBLE PARTY:    ?Contact Information   ? ? Name Relation Home Work Mobile  ? Morris,Charles Spouse [phone]    ? ?  ? ? ? ?I met face to face with patient, spouse and caregiver Rosa in patient's home. Palliative Care was asked to follow this patient by consultation request of  Panosh, [name], MD to address advance care planning and complex medical decision making. This is a follow up visit. ? ?                                 ASSESSMENT, SYMPTOM MANAGEMENT AND PLAN / RECOMMENDATIONS:  ?Urinary dysfunction ?Appearing to resolve spontaneously following frequent Bms for the last 2 days. ?Recently completed antibiotics for UTI. ? ?2.   Constipation ?Agree with Miralax 17 gm daily to regulate Bms, increase fluids and have pt increase mobility as much as possible. ? ?3.   Palliative Care Encounter ?Provide support for spouse and paid caregiver, follow up in 2-3 weeks after pt has 2nd cataract surgery. ?Monitor for difficulty swallowing. ? ?4.   Depression with anxiety ?Continues to be tearful and exhibit distress/anxiety when discussing unpleasant topics related to patient. ?Encourage use of Celexa to see if this will help. ? ? ? ? ?Follow up Palliative Care Visit: Palliative care will continue to follow for complex medical decision making, advance care planning, and clarification of goals. Return 2-3 weeks or prn. ? ? ?This visit was coded based on medical decision making (MDM). ? ?PPS: 40% ? ?HOSPICE  ELIGIBILITY/DIAGNOSIS: TBD ? ?Chief Complaint:  ?Palliative Care is following to help with chronic management of progressive supranuclear palsy with pt c/o decreased urination recently. ? ?HISTORY OF PRESENT ILLNESS:  Cheyla Kim is a 81 y.o. year old female  with progressive supranuclear palsy, hx of TBI, arthritis and atypical parkinsonism.  Pt with recent UTI and completed antibiotics.  Spouse questioned repeat UTI and urine sample negative but had gotten to where she only voided twice per day.  In the last several days she has had 4-6 bowel movements and urination has improved back to normal. He is giving her Miralax daily and has made an appt for her to follow up with Urology.  Spouse has not started Celexa yet as he didn't want to make her sleepy.  Pt has had increased coughing with solids, he is now crushing meds and giving in applesauce.  Not ready to convert all meds to liquids.  Spouse not giving Celexa solution with concern for potentially making pt drowsy like she was with Ativan and unable to help with transfers to bathroom. Advised that she may be sleepy but it will not make her weak and unable to do transfers. He has not started Celexa yet and will try now that he has 24/7 caregivers in the home while he has been having cataract surgery and is unable to lift her. ? ?History obtained from review of EMR, discussion with spouse and paid   caregiver and/or Ms. Bergeman.  ?I reviewed available labs, medications, imaging, studies and related documents from the EMR.  Records reviewed and summarized above.  ? ?ROS-information as per above from spouse/caregiver ?Pt only says "yes/no" or occasionally one word.   ? ?Physical Exam: ?Current and past weights: Last weight as of 04/25/21 is 152 lbs, prior weight 01/17/20 150 lbs 9.6 ounces ?Constitutional: NAD ?General: WD/WN female ?EYES: anicteric sclera, lids intact, no discharge  ?ENMT: intact hearing, oral mucous membranes moist, dentition intact ?CV: S1S2, RRR, trace  BLE edema ?Pulmonary: CTAB, no increased work of breathing, no cough, room air ?Abdomen: normo-active BS + 4 quadrants, soft, mild tenderness suprapubic, no ascites ?GU: deferred ?MSK: no sarcopenia, moves all extremities, ambulatory with assistance ?Skin: warm and dry, no rashes or wounds on visible skin ?Neuro:  noted generalized weakness,  ?Psych: non-anxious affect, A and O to name, remains largely non-verbal ?Hem/lymph/immuno: no widespread bruising ? ? ? ?Thank you for the opportunity to participate in the care of Ms. Edmundson.  The palliative care team will continue to follow. Please call our office at [phone] if we can be of additional assistance.  ? ? A , FNP -C ? ? ?COVID-19 PATIENT SCREENING TOOL ?Asked and negative response unless otherwise noted:  ? ?Have you had symptoms of covid, tested positive or been in contact with someone with symptoms/positive test in the past 5-10 days? No ? ?

## 2021-05-26 NOTE — Telephone Encounter (Signed)
Spoke with patient's husband and informed him of message he verbalized understanding ?

## 2021-05-28 ENCOUNTER — Telehealth: Payer: Self-pay | Admitting: Pharmacist

## 2021-05-28 NOTE — Chronic Care Management (AMB) (Signed)
? ? ?Chronic Care Management ?Pharmacy Assistant  ? ?Name: Pamela Alvarez  MRN: [id number] DOB: [birth year] ? ?Reason for Encounter: Disease State / Hypertension Assessment Call ?  ?Conditions to be addressed/monitored: ?HTN ? ? ?Recent office visits:  ?05/19/2021 Pamela Ace MD - Patient was seen for inability to urinate and additional issues. No medication changes. Follow up symptoms worsen or fail to improve.  ? ?04/17/2021 Pamela Ace MD - Patient was seen for Urinary tract infection without hematuria, site unspecified. Started Cefdinir 300 mg twice daily. Follow up if symptoms worsen or fail to improve. ? ?04/11/2021 Pamela Peng NP - Patient was seen for dysuria. Started Cipro 500 mg twice daily for 3 days. No follow up noted.  ? ?03/18/2021 Pamela Ace MD - Patient was seen for Chronic deep vein thrombosis (DVT) of proximal vein of left lower extremity and additional issues. No follow up noted.  ? ?Recent consult visits:  ?04/25/2021 Pamela Pollen MD (vascular surgery) - Patient was seen for Chronic deep vein thrombosis (DVT) of femoral vein of both lower extremities. No medication changes. No follow up noted ? ?Hospital visits:  ?None ? ?Medications: ?Outpatient Encounter Medications as of 05/28/2021  ?Medication Sig  ? apixaban (ELIQUIS) 5 MG TABS tablet TAKE 1 TABLET BY MOUTH TWICE A DAY FOR DVT  ? atorvastatin (LIPITOR) 20 MG tablet Take 1 tablet (20 mg total) by mouth daily.  ? cefdinir (OMNICEF) 300 MG capsule Take 1 capsule (300 mg total) by mouth 2 (two) times daily. For uti  ? Cholecalciferol (VITAMIN D3 PO) Take 1 tablet by mouth daily.  ? citalopram (CELEXA) 10 MG/5ML suspension Take by mouth.  ? Famotidine (PEPCID PO) Take 10 mg by mouth at bedtime.  ? HYDROcodone-acetaminophen (NORCO) 5-325 MG tablet Take 2 tablets by mouth every 4 (four) hours as needed.  ? LORazepam (ATIVAN) 0.5 MG tablet Take 0.5 mg by mouth daily as needed.  ? losartan (COZAAR) 25 MG tablet TAKE 1 TABLET BY MOUTH EVERY DAY  ?  vitamin B-12 (CYANOCOBALAMIN) 500 MCG tablet Take 500 mcg by mouth daily.  ? ?No facility-administered encounter medications on file as of 05/28/2021.  ?Fill History: ?ELIQUIS 5 MG TABLET 05/20/2021 30  ? ?ATORVASTATIN 10 MG TABLET 05/08/2021 90  ? ?citalopram 10 mg/5 mL solution 05/07/2021 90  ? ?lorazepam 0.5 mg tablet 03/27/2021 30  ? ?LOSARTAN POTASSIUM 25 MG TAB 04/10/2021 90  ? ?Reviewed chart prior to disease state call. Spoke with patient regarding BP ? ?Recent Office Vitals: ?BP Readings from Last 3 Encounters:  ?05/26/21 130/70  ?05/19/21 (!) 154/73  ?05/02/21 120/80  ? ?Pulse Readings from Last 3 Encounters:  ?05/26/21 85  ?05/19/21 83  ?05/02/21 62  ?  ?Wt Readings from Last 3 Encounters:  ?04/25/21 152 lb (68.9 kg)  ?02/24/21 152 lb (68.9 kg)  ?02/12/21 154 lb 8 oz (70.1 kg)  ?  ? ?Kidney Function ?Lab Results  ?Component Value Date/Time  ? CREATININE 1.05 (H) 03/09/2021 06:41 PM  ? CREATININE 1.13 12/25/2020 12:00 PM  ? CREATININE 1.13 (H) 12/22/2019 04:10 PM  ? GFR 45.78 (L) 12/25/2020 12:00 PM  ? GFRNONAA 53 (L) 03/09/2021 06:41 PM  ? GFRNONAA 46 (L) 12/22/2019 04:10 PM  ? GFRAA 53 (L) 12/22/2019 04:10 PM  ? ? ? ?  Latest Ref Rng & Units 03/09/2021  ?  6:41 PM 12/25/2020  ? 12:00 PM 12/22/2019  ?  4:10 PM  ?BMP  ?Glucose 70 - 99 mg/dL 128   77   106    ?  BUN 8 - 23 mg/dL 21   20   20     ?Creatinine 0.44 - 1.00 mg/dL 1.05   1.13   1.13    ?BUN/Creat Ratio 6 - 22 (calc)   18    ?Sodium 135 - 145 mmol/L 141   140   140    ?Potassium 3.5 - 5.1 mmol/L 3.8   4.0   4.3    ?Chloride 98 - 111 mmol/L 108   106   105    ?CO2 22 - 32 mmol/L 26   23   25     ?Calcium 8.9 - 10.3 mg/dL 9.5   9.6   9.7    ? ? ?Current antihypertensive regimen: Losartan 25 mg daily ? ?How often are you checking your Blood Pressure?  ? ?Current home BP readings:  ? ?What recent interventions/DTPs have been made by any provider to improve Blood Pressure control since last CPP Visit: No recent interventions ? ?Any recent hospitalizations or  ED visits since last visit with CPP? No recent hospital visits.  ? ?What diet changes have been made to improve Blood Pressure Control?  ?Patient follows ?Breakfast - patient will have ?Lunch - patient will have ?Dinner - patient will have ? ?What exercise is being done to improve your Blood Pressure Control?  ? ? ?Adherence Review: ?Is the patient currently on Alvarez/ARB medication? Yes ?Does the patient have >5 day gap between last estimated fill dates? No ? ?Spoke with patient, she states she was unhappy with her initial visit and that this was all misleading and she has discussed this with Dr. Regis Bill.  Patient states she no longer wants our services and no longer wants any calls from [place].  ? ?Care Gaps: ?AWV - previous message sent to Ramond Craver ?Last BP - 161/74 on 04/25/2021 ?TDAP - overdue ?Covid vaccine - overdue ?  ?Star Rating Drugs: ?Atorvastatin 10 mg - last filled 05/08/2021 90 [facility] ?Losartan 25 mg  - last filled 03/27/2021 90 [facility] at [facility] ?  ? ?Gennie Alma CMA  ?Clinical Pharmacist Assistant ?[phone] ? ?

## 2021-06-03 ENCOUNTER — Telehealth: Payer: Self-pay | Admitting: *Deleted

## 2021-06-03 ENCOUNTER — Other Ambulatory Visit (HOSPITAL_BASED_OUTPATIENT_CLINIC_OR_DEPARTMENT_OTHER): Payer: Self-pay | Admitting: Family Medicine

## 2021-06-03 DIAGNOSIS — R339 Retention of urine, unspecified: Secondary | ICD-10-CM

## 2021-06-03 NOTE — Telephone Encounter (Signed)
Received lab order request from Pamela Hippo NP. She would like for a CBC and CMP to be drawn while patient is at [facility]. I contacted them to obtain their fax number [phone]). Lab orders faxed to [facility] and confirmation fax was received.  ?

## 2021-06-03 NOTE — Telephone Encounter (Signed)
Received a communication from Damaris Hippo NP requesting a stat renal ultrasound order be faxed to [facility] for acute urinary retention R33.9. Faxed order as requested and received fax confirmation that it was received. ?

## 2021-06-07 ENCOUNTER — Ambulatory Visit (HOSPITAL_BASED_OUTPATIENT_CLINIC_OR_DEPARTMENT_OTHER)
Admission: RE | Admit: 2021-06-07 | Discharge: 2021-06-07 | Disposition: A | Discharge status: Home / Self Care | Payer: Medicare PPO | Source: Ambulatory Visit | Attending: Family Medicine | Admitting: Family Medicine

## 2021-06-07 DIAGNOSIS — N281 Cyst of kidney, acquired: Secondary | ICD-10-CM | POA: Insufficient documentation

## 2021-06-07 DIAGNOSIS — R339 Retention of urine, unspecified: Secondary | ICD-10-CM | POA: Insufficient documentation

## 2021-06-09 ENCOUNTER — Encounter: Payer: Self-pay | Admitting: Urology

## 2021-06-09 ENCOUNTER — Ambulatory Visit: Payer: Medicare PPO | Admitting: Urology

## 2021-06-09 VITALS — BP 129/80 | HR 84 | Ht 62.0 in | Wt 152.0 lb

## 2021-06-09 DIAGNOSIS — R339 Retention of urine, unspecified: Secondary | ICD-10-CM | POA: Diagnosis not present

## 2021-06-09 DIAGNOSIS — N390 Urinary tract infection, site not specified: Secondary | ICD-10-CM

## 2021-06-09 LAB — BLADDER SCAN AMB NON-IMAGING: Scan Result: 276

## 2021-06-09 NOTE — Progress Notes (Signed)
? ?06/09/2021 ?2:59 PM  ? ?Pamela Alvarez ?09/23/1940 ?[id number] ? ?Referring provider: [name], MD ?[phone] ?[place],  [place] [address] ? ?Chief Complaint  ?Patient presents with  ? Urinary Incontinence  ? ? ?HPI: ?Pamela Alvarez is a 81 y.o. female referred for possible urinary retention.  She presents today with her husband and a caregiver who assists him ~ 40 hours/week ? ?She has progressive supranuclear palsy and history TBI ?Contacted PCP 05/19/2021 noticing that her voiding had diminished to every 11-16 hours ?Urine culture showed no evidence of infection ?Over the last week he feels she is more at baseline and voiding at least 4-6 times per day ?Does not appear to be in any discomfort ?CT abdomen pelvis January 2023 showed normal-appearing kidneys without hydronephrosis and a nondistended bladder ?RUS 06/07/2021 showed no hydronephrosis and a left simple renal cyst.  Estimated bladder volume was 224 mL ? ?PMH: ?Past Medical History:  ?Diagnosis Date  ? Cyst 07/2008  ? Cyst on back x2 that were drained  ? H/O echocardiogram 02/2000  ? Hx of colonic polyps   ? Hyperlipidemia   ? Hypertension   ? PSP (progressive supranuclear palsy) (HCC)   ? TBI (traumatic brain injury) (HCC)   ? Varicose veins of both lower extremities   ? ? ?Surgical History: ?Past Surgical History:  ?Procedure Laterality Date  ? POLYPECTOMY    ? Colon  ? TONSILLECTOMY    ? TUBAL LIGATION    ? ? ?Home Medications:  ?Allergies as of 06/09/2021   ? ?   Reactions  ? Dust Mite Extract Cough  ? Sulfonamide Derivatives   ? ?  ? ?  ?Medication List  ?  ? ?  ? Accurate as of June 09, 2021  2:59 PM. If you have any questions, ask your nurse or doctor.  ?  ?  ? ?  ? ?atorvastatin 20 MG tablet ?Commonly known as: LIPITOR ?Take 1 tablet (20 mg total) by mouth daily. ?  ?cefdinir 300 MG capsule ?Commonly known as: OMNICEF ?Take 1 capsule (300 mg total) by mouth 2 (two) times daily. For uti ?  ?citalopram 10 MG/5ML suspension ?Commonly known as:  CELEXA ?Take by mouth. ?  ?Eliquis 5 MG Tabs tablet ?Generic drug: apixaban ?TAKE 1 TABLET BY MOUTH TWICE A DAY FOR DVT ?  ?HYDROcodone-acetaminophen 5-325 MG tablet ?Commonly known as: Norco ?Take 2 tablets by mouth every 4 (four) hours as needed. ?  ?LORazepam 0.5 MG tablet ?Commonly known as: ATIVAN ?Take 0.5 mg by mouth daily as needed. ?  ?losartan 25 MG tablet ?Commonly known as: COZAAR ?TAKE 1 TABLET BY MOUTH EVERY DAY ?  ?PEPCID PO ?Take 10 mg by mouth at bedtime. ?  ?vitamin B-12 500 MCG tablet ?Commonly known as: CYANOCOBALAMIN ?Take 500 mcg by mouth daily. ?  ?VITAMIN D3 PO ?Take 1 tablet by mouth daily. ?  ? ?  ? ? ?Allergies:  ?Allergies  ?Allergen Reactions  ? Dust Mite Extract Cough  ? Sulfonamide Derivatives   ? ? ?Family History: ?Family History  ?Problem Relation Age of Onset  ? Hypertension Mother   ? Parkinsonism Mother   ? Angina Father   ? Heart disease Father   ? Colon cancer Father   ? Lung cancer Father   ? Skin cancer Father   ? Cancer Father   ?     Heart  ? Breast cancer Sister   ?     over 50  ? Diabetes  Sister   ? Heart disease Brother 84  ?     triple bypass surgery  ? Macular degeneration Sister   ? Diabetes Sister   ?     prediabetic  ? Hearing loss Sister   ?     hearing problems  ? Vasculitis Sister   ? ? ?Social History:  reports that she has never smoked. She has never used smokeless tobacco. She reports that she does not currently use alcohol after a past usage of about 7.0 standard drinks per week. She reports that she does not currently use drugs. ? ? ?Physical Exam: ?BP 129/80   Pulse 84   Ht 5\' 2"  (1.575 m)   Wt 152 lb (68.9 kg)   BMI 27.80 kg/m?   ?Constitutional:  Alert, no acute distress. ?Respiratory: Normal respiratory effort, no increased work of breathing. ? ? ?Pertinent Imaging: ?Images were reviewed and personally interpreted ? ? RENAL ? ?Narrative ?CLINICAL DATA:  Urinary retention. ? ?EXAM: ?RENAL / URINARY TRACT ULTRASOUND COMPLETE ? ?COMPARISON:  CT AP  03/10/2021 ? ?FINDINGS: ?Right Kidney: ? ?Renal measurements: 9.2 x 4.2 x 4.4 cm = volume: 87.5 mL. ?Echogenicity within normal limits. No mass or hydronephrosis ?visualized. ? ?Left Kidney: ? ?Renal measurements: 9 x 4.7 x 4.6 cm = volume: 102.6 mL. ?Echogenicity within normal limits. No hydronephrosis. Cyst arising ?off the lateral cortex of the left kidney measures 2.1 x 1.3 x 1.6 ?cm. ? ?Bladder: ? ?Appears normal. The prevoid bladder volume is equal to 224.3 cc. ?Postvoid imaging not obtained as patient was unable to void. ? ?Other: ? ?None. ? ?IMPRESSION: ?1. No signs of obstructive uropathy. ?2. Left kidney cyst. ? ? ?Electronically Signed ?By: 05/08/2021 M.D. ?On: 06/07/2021 11:06 ? ? ?Assessment & Plan:   ? ?1.  Incomplete bladder emptying ?PSP is associated with impaired detrusor contractility, elevated residuals and urinary retention ?Presently her residuals are low < 300 mL and would not recommend any specific treatment at this time ?67-month follow-up for bladder scan ?Should she have any signs of difficulty emptying prior to that time they will call for an earlier appointment ?Should she develop urinary retention in the future we discussed options of intermittent catheterization versus a chronic indwelling catheter ? ? ?2-month, MD ? ? [facility] ?[address] ?[place], [place] ?[phone] ? ?

## 2021-06-10 ENCOUNTER — Other Ambulatory Visit: Payer: Medicare PPO | Admitting: Family Medicine

## 2021-06-10 ENCOUNTER — Encounter: Payer: Self-pay | Admitting: Family Medicine

## 2021-06-10 VITALS — BP 124/58 | HR 79 | Temp 97.5°F | Resp 18

## 2021-06-10 DIAGNOSIS — K5900 Constipation, unspecified: Secondary | ICD-10-CM

## 2021-06-10 DIAGNOSIS — Z515 Encounter for palliative care: Secondary | ICD-10-CM

## 2021-06-10 DIAGNOSIS — F418 Other specified anxiety disorders: Secondary | ICD-10-CM

## 2021-06-10 DIAGNOSIS — R39198 Other difficulties with micturition: Secondary | ICD-10-CM

## 2021-06-10 NOTE — Progress Notes (Signed)
? ? ?[facility] ?Community Palliative Care Consult Note ?Telephone: [phone]  ?Fax: [phone]  ? ? ?Date of encounter: 06/10/21 ?4:15 PM ?PATIENT NAME: Pamela Alvarez ?[address] ?[place] [address]   ?[phone] (home)  ?DOB: 09-13-1940 ?MRN: [id number] ?PRIMARY CARE PROVIDER:    ?Burnis Medin, MD,  ?[address] ?[place] [address] ?[phone] ? ?REFERRING PROVIDER:   ?Burnis Medin, MD ?[address] ?[place],  [place] [address] ?[phone] ? ?RESPONSIBLE PARTY:    ?Contact Information   ? ? Name Relation Home Work Mobile  ? Pamela Alvarez,Pamela Alvarez Spouse [phone]    ? ?  ? ? ? ?I met face to face with patient, spouse and caregiver Pamela Alvarez in patient's home. Palliative Care was asked to follow this patient by consultation request of  Pamela Alvarez, Pamela Brooking, MD to address advance care planning and complex medical decision making. This is a follow up visit. ? ?                                 ASSESSMENT, SYMPTOM MANAGEMENT AND PLAN / RECOMMENDATIONS:  ?Urinary dysfunction ?No hydronephrosis or excess urinary residual in bladder on recent US ?Followed up with Urologist who got similar results on bladder volume in spontaneous exam in office. ?Believe that when pt urinary intervals increase with oliguria that this is likely relative to hydration status vs constipation. ?Keep follow up in 3 months for bladder scan, sooner if pt demonstrating urinary retention. ? ?2.   URI ?Current URI with sick contacts within family and caregiver. ?Continue to elevate HOB for sleep/aide breathing without cough. ?Increase fluid intake and rest. ?Call if develops fever, SOB or no significant improvement in symptoms over 5-7 days. ? ?3.   Palliative Care Encounter ?Condition deteriorating some ?Agree with finely chopped foods to help with swallow, monitor for s/sx of dysphagia ?May need Hospital bed if having increased difficulty managing secretions and poorer mobility. ? ?4.   Depression with  anxiety ?Mood improved with use of Celexa. ? ?5.  Constipation ?Continue Miralax 17 gm daily, increase fluids and high fiber foods. ? ? ? ? ?Follow up Palliative Care Visit: Palliative care will continue to follow for complex medical decision making, advance care planning, and clarification of goals. Return 4 weeks or prn. ? ? ?This visit was coded based on medical decision making (MDM). ? ?PPS: 30% ? ?HOSPICE ELIGIBILITY/DIAGNOSIS: TBD ? ?Chief Complaint:  ?Palliative Care is following to help with chronic management of progressive supranuclear palsy with pt currently with cough and difficulty sleeping due to cough. ? ?HISTORY OF PRESENT ILLNESS:  Pamela Alvarez is a 81 y.o. year old female  with progressive supranuclear palsy, hx of TBI, arthritis and atypical parkinsonism.  Pt with recent renal US which was normal without stone or hydronephrosis. She was seen by Urology who did bladder scan with no significant urinary volume in bladder.  Has some cough and congestion, had to sleep sitting up to sleep.  Mood is improved since starting Celexa and pt will only rarely get sleepy. Both spouse and caregiver have hoarseness and cough.  Spouse states that pt has had cough and trouble sleeping until he raised the head of her bed.  He states he is having to more finely chop her foods because she is unable to chew well.  Spouse states she is having significantly more difficulty walking than previously. Per Urology, PSP is associated with impaired detrusor  contractility, elevated residual and urinary retention and as long as residuals are < 300, no specific treatment was recommended.  If develops urinary retention then urologist recommending intermittent catheterization vs chronic indwelling cath.  ? ?History obtained from review of EMR, discussion with spouse and paid caregiver and/or Pamela Alvarez.  ?I reviewed available labs, medications, imaging, studies and related documents from the EMR.  Records reviewed and summarized above.   ? ?ROS-information as per above from spouse/caregiver ?Pt only says "yes/no".   ? ?Physical Exam: ?Current and past weights: Last weight as of 06/09/21 was 152 lbs, prior weight 01/17/20 150 lbs 9.6 ounces ?Constitutional: NAD ?General: WD/WN female ?EYES: anicteric sclera, lids intact, no discharge  ?ENMT: intact hearing, oral mucous membranes moist, dentition intact ?CV: S1S2, RRR, trace RLE edema/1+ on LLE ?Pulmonary: CTAB, no increased work of breathing, no cough, room air ?Abdomen: normo-active BS + 4 quadrants, soft, mild tenderness suprapubic, no ascites ?GU: deferred ?MSK: no sarcopenia, moves all extremities ?Skin: warm and dry, no rashes or wounds on visible skin ?Neuro:  noted generalized weakness,  ?Psych: non-anxious affect, A and O to name, remains largely non-verbal ?Hem/lymph/immuno: no widespread bruising ? ? ? ?Thank you for the opportunity to participate in the care of Pamela Alvarez.  The palliative care team will continue to follow. Please call our office at [phone] if we can be of additional assistance.  ? ?Marijo Conception, FNP -C ? ? ?COVID-19 PATIENT SCREENING TOOL ?Asked and negative response unless otherwise noted:  ? ?Have you had symptoms of covid, tested positive or been in contact with someone with symptoms/positive test in the past 5-10 days? unknown ? ?

## 2021-06-11 ENCOUNTER — Encounter: Payer: Self-pay | Admitting: Urology

## 2021-06-18 ENCOUNTER — Ambulatory Visit: Payer: Self-pay | Admitting: Urology

## 2021-06-18 DIAGNOSIS — L0889 Other specified local infections of the skin and subcutaneous tissue: Secondary | ICD-10-CM | POA: Diagnosis not present

## 2021-06-18 DIAGNOSIS — L723 Sebaceous cyst: Secondary | ICD-10-CM | POA: Diagnosis not present

## 2021-06-24 ENCOUNTER — Telehealth: Payer: Self-pay | Admitting: Family Medicine

## 2021-06-24 ENCOUNTER — Encounter: Payer: Self-pay | Admitting: Family Medicine

## 2021-06-24 NOTE — Telephone Encounter (Signed)
Call from pt's spouse to indicate that pt is coughing and has to sleep sitting up or propped in recliner currently and wants to ask about hospital bed. Will ask nurse to see if we can set up a video visit in next few days with cough and pt at risk for aspiration.  Likely would benefit from hospital bed.  Spouse asking if he can pay the difference to purchase a better mattress if mattress is not good.  Advised that he could address with DME provider if hospital bed warranted.  Damaris Hippo FNP-C ?

## 2021-06-27 ENCOUNTER — Encounter: Payer: Self-pay | Admitting: Family Medicine

## 2021-06-27 ENCOUNTER — Telehealth: Payer: Self-pay | Admitting: *Deleted

## 2021-06-27 ENCOUNTER — Other Ambulatory Visit: Payer: Medicare PPO | Admitting: Family Medicine

## 2021-06-27 VITALS — BP 122/60 | HR 81 | Resp 18

## 2021-06-27 DIAGNOSIS — R39198 Other difficulties with micturition: Secondary | ICD-10-CM

## 2021-06-27 DIAGNOSIS — J069 Acute upper respiratory infection, unspecified: Secondary | ICD-10-CM | POA: Insufficient documentation

## 2021-06-27 DIAGNOSIS — R1310 Dysphagia, unspecified: Secondary | ICD-10-CM

## 2021-06-27 DIAGNOSIS — G231 Progressive supranuclear ophthalmoplegia [Steele-Richardson-Olszewski]: Secondary | ICD-10-CM

## 2021-06-27 DIAGNOSIS — Z515 Encounter for palliative care: Secondary | ICD-10-CM

## 2021-06-27 NOTE — Telephone Encounter (Signed)
Called and spoke with patient's spouse-Chuck, as requested by Joycelyn Man FNP to let him know that I faxed the order for a semi-electric hospital bed to [facility] today and also advised that patient's PCP is out of the office today so [name] will not be able to discuss hospice with her until PCP returns on Monday. [name] was appreciative of phone call.  ?

## 2021-06-27 NOTE — Progress Notes (Signed)
? ? ?Manufacturing engineer ?[facility] Consult Note ?Telephone: [phone]  ?Fax: [phone]  ? ? ?Date of encounter: 06/27/21 ?4:15 PM ?PATIENT NAME: Pamela Alvarez ?[address] ?[place] [address]   ?[phone] (home)  ?DOB: 09/11/1940 ?MRN: [id number] ?PRIMARY CARE PROVIDER:    ?Burnis Medin, MD,  ?[address] ?[place] [address] ?[id number] ? ?REFERRING PROVIDER:   ?Burnis Medin, MD ?[address] ?[place],  [place] [address] ?[phone] ? ?RESPONSIBLE PARTY:    ?Contact Information   ? ? Name Relation Home Work Mobile  ? Morris,Charles Spouse [phone]    ? ?  ? ? ? ?I met face to face with patient, spouse and caregiver Rosa in patient's home. Palliative Care was asked to follow this patient by consultation request of  Panosh, Standley Brooking, MD to address advance care planning and complex medical decision making. This is a follow up visit. ? ?                                 ASSESSMENT, SYMPTOM MANAGEMENT AND PLAN / RECOMMENDATIONS:  ?Swallowing problems and problems with mastication/PSP ?Likely aspiration with pt having more difficulty managing secretions due to worsening PSP ?Continue comfort feeds with soft foods, finely chopped or pureed. ?Upright for all intake and for a minimum of 1 hour after. ?Semi-electric hospital bed to allow for elevation of HOB and elevation of edematous LLE ? ?Urinary dysfunction ?Continues to void regularly, now fully incontinent of bladder. ? ?3.   URI ?Continues with cough but seems more consistent with dysphagia than infection now. ?Increase fluid intake and rest. ?Call if develops fever, SOB or worsening cough/alertness. ? ?3.   Palliative Care Encounter ?Condition deteriorating some ?Agree with finely chopped foods/soft foods to help with swallow, monitor for s/sx of aspiration ?Hospital bed for increased difficulty managing secretions and poorer mobility with BLE edema when dependent. ? ? ? ? ? ? ?Follow up  Palliative Care Visit: Palliative care will continue to follow for complex medical decision making, advance care planning, and clarification of goals if pt not accepted onto Hospice Care. Return 4 weeks or prn. ? ? ?This visit was coded based on medical decision making (MDM). ? ?PPS: 30% ? ?HOSPICE ELIGIBILITY/DIAGNOSIS: TBD ? ?Chief Complaint:  ?Spouse requested acute visit due to increased coughing when laying down, unable to sleep unless propped up on a wedge or in recliner. ? ?HISTORY OF PRESENT ILLNESS:  Pamela Alvarez is a 81 y.o. year old female  with progressive supranuclear palsy, hx of TBI, arthritis and atypical parkinsonism.  Spouse called requesting an acute visit when after having a URI pt was noted to have increased fatigue and wet cough when laying down. She has been sleeping more during the day until the last few days when she has slept better at night with him having her propped up.  He states she has had cough with trouble sleeping until he raised the head of her bed or sat her in the recliner.  He states she has more issues now when eating solids if she can't chew so he is having to either do softer foods or more chopped to prevent her from coughing with eating. Pt had renal US which did not show much urine in the bladder but she was voiding much less frequently.  She was then evaluated per Urology, PSP is associated with impaired detrusor contractility, elevated residual and urinary  retention and as long as residuals are < 300, no specific treatment was recommended.  If develops urinary retention then urologist recommending intermittent catheterization vs chronic indwelling cath. Per spouse, pt is now continuously wearing depends, Bms are more regular and she is wetting depends frequently.  Pt "knows" per spouse when she needs to have a BM and has some control for a limited time.  Pt has little voluntary movement.  Spouse states he is starting to need more help especially on the weekends with her  personal care as she is total care.  He wants to maintain her at home but is requesting a hospital bed for her. Spouse tearful when discussing Hospice but states he has noted a significant change in the last 4-5 weeks since pt has been sick. ? ?History obtained from review of EMR, discussion with spouse and paid caregiver and/or Ms. Sliva.  ?I reviewed available labs, medications, imaging, studies and related documents from the EMR.  Records reviewed and summarized above.  ? ?ROS-information as per above from spouse/caregiver ?Pt only says "yes/no".   ? ?Physical Exam: ?Current and past weights: Last weight as of 06/09/21 was 152 lbs,  ?Constitutional: NAD ?General: WD female ?EYES: anicteric sclera, lids intact, watery drainage bilaterally ?ENMT: intact hearing, oral mucous membranes moist, dentition intact in varying stages of repair ?CV: S1S2, RRR, no RLE edema/1+ on LLE ?Pulmonary: CTAB, no increased work of breathing, intermittent cough, room air ?Abdomen: normo-active BS + 4 quadrants, soft, non- tender, no ascites ?GU: deferred ?MSK: moves all extremities. Some loss of subcu fat around face and bilateral UE in deltoid area ?Skin: warm and dry, no rashes or wounds on visible skin ?Neuro:  noted generalized weakness,  ?Psych: non-anxious affect, A and O to name, remains largely non-verbal ?Hem/lymph/immuno: no widespread bruising ? ? ? ?Thank you for the opportunity to participate in the care of Ms. Goren.  The palliative care team will continue to follow. Please call our office at [phone] if we can be of additional assistance.  ? ?Marijo Conception, FNP -C ? ? ?COVID-19 PATIENT SCREENING TOOL ?Asked and negative response unless otherwise noted:  ? ?Have you had symptoms of covid, tested positive or been in contact with someone with symptoms/positive test in the past 5-10 days? no ? ?

## 2021-07-04 ENCOUNTER — Other Ambulatory Visit: Payer: Self-pay | Admitting: Internal Medicine

## 2021-07-09 ENCOUNTER — Other Ambulatory Visit: Payer: Medicare PPO | Admitting: Family Medicine

## 2021-08-11 ENCOUNTER — Other Ambulatory Visit: Payer: Self-pay | Admitting: Internal Medicine

## 2021-08-20 ENCOUNTER — Telehealth: Payer: Self-pay | Admitting: Neurology

## 2021-08-20 ENCOUNTER — Ambulatory Visit: Payer: Medicare PPO | Admitting: Neurology

## 2021-08-20 NOTE — Telephone Encounter (Signed)
Called the pt's husband in regards to scheduled apt this am with Dr Vickey Huger. Dr Dohmeier had to unexpectedly be out of the office.  LVM advising pt of this and to call back.  Tried 2 times to call.

## 2021-09-04 ENCOUNTER — Ambulatory Visit: Payer: Medicare PPO | Admitting: Urology

## 2021-09-10 ENCOUNTER — Ambulatory Visit: Payer: Medicare PPO | Admitting: Urology

## 2021-09-22 ENCOUNTER — Other Ambulatory Visit: Payer: Self-pay | Admitting: Internal Medicine

## 2021-10-07 ENCOUNTER — Encounter: Payer: Self-pay | Admitting: Neurology

## 2021-10-07 ENCOUNTER — Ambulatory Visit: Payer: Medicare PPO | Admitting: Neurology

## 2021-10-07 VITALS — BP 143/74 | HR 92 | Ht 62.0 in | Wt 142.0 lb

## 2021-10-07 DIAGNOSIS — G9389 Other specified disorders of brain: Secondary | ICD-10-CM | POA: Diagnosis not present

## 2021-10-07 DIAGNOSIS — M62838 Other muscle spasm: Secondary | ICD-10-CM | POA: Diagnosis not present

## 2021-10-07 DIAGNOSIS — G231 Progressive supranuclear ophthalmoplegia [Steele-Richardson-Olszewski]: Secondary | ICD-10-CM | POA: Diagnosis not present

## 2021-10-07 DIAGNOSIS — G20 Parkinson's disease: Secondary | ICD-10-CM | POA: Diagnosis not present

## 2021-10-07 MED ORDER — BACLOFEN 10 MG PO TABS: 10.0000 mg | ORAL_TABLET | Freq: Three times a day (TID) | ORAL | 5 refills | Status: DC

## 2021-10-07 NOTE — Patient Instructions (Addendum)
Baclofen Tablets What is this medication? BACLOFEN (BAK loe fen) treats muscle spasms. It works by relaxing your muscles, which reduces muscle stiffness. It belongs to a group of medications called muscle relaxants. This medicine may be used for other purposes; ask your health care provider or pharmacist if you have questions. COMMON BRAND NAME(S): ED Baclofen, Lioresal What should I tell my care team before I take this medication? They need to know if you have any of these conditions: High blood pressure History of stroke Kidney disease Mental health disease Ovarian cysts Seizures An unusual or allergic reaction to baclofen, other medications, foods, dyes or preservatives Pregnant or trying to get pregnant Breast-feeding How should I use this medication? Take this medication by mouth. Take it as directed on the prescription label. Keep taking it unless your care team tells you to stop. Stopping it too quickly can cause serious side effects. It can also make your condition worse. Talk to your care team about the use of this medication in children. While it may be prescribed for children as young as 12 years for selected conditions, precautions do apply. Overdosage: If you think you have taken too much of this medicine contact a poison control center or emergency room at once. NOTE: This medicine is only for you. Do not share this medicine with others. What if I miss a dose? If you miss a dose, take it as soon as you can. If it is almost time for your next dose, take only that dose. Do not take double or extra doses. What may interact with this medication? Do not take this medication with any of the following: Narcotic medications for cough This medication may also interact with the following: Alcohol Antihistamines for allergy, cough and cold Certain medications for anxiety or sleep Certain medications for depression like amitriptyline, fluoxetine, sertraline Certain medications for  seizures like phenobarbital, primidone General anesthetics like halothane, isoflurane, methoxyflurane, propofol Narcotic medications for pain Other medications that relax muscles Phenothiazines like chlorpromazine, mesoridazine, prochlorperazine, thioridazine This list may not describe all possible interactions. Give your health care provider a list of all the medicines, herbs, non-prescription drugs, or dietary supplements you use. Also tell them if you smoke, drink alcohol, or use illegal drugs. Some items may interact with your medicine. What should I watch for while using this medication? Visit your care team for regular checks on your progress. Tell your care team if your symptoms do not start to get better or if they get worse. Do not suddenly stop taking this medication. You may develop a severe reaction. Your care team will tell you how much medication to take. If your care team wants you to stop the medication, the dose may be slowly lowered over time to avoid any side effects. You may get drowsy or dizzy. Do not drive, use machinery, or do anything that needs mental alertness until you know how this medication affects you. Do not stand up or sit up quickly, especially if you are an older patient. This reduces the risk of dizzy or fainting spells. Alcohol may interfere with the effect of this medication. Avoid alcoholic drinks. If you take other medications that also cause drowsiness such as other narcotic pain medications, benzodiazepines, or other medications for sleep, you may have more side effects. Give your care team a list of all medications you use. They will tell you how much medication to take. Do not take more medication than directed. Get emergency help right away if you have trouble  breathing or are unusually tired or sleepy. What side effects may I notice from receiving this medication? Side effects that you should report to your care team as soon as possible: Allergic  reactions--skin rash, itching, hives, swelling of the face, lips, tongue, or throat CNS depression--slow or shallow breathing, shortness of breath, feeling faint, dizziness, confusion, trouble staying awake Side effects that usually do not require medical attention (report to your care team if they continue or are bothersome): Dizziness Drowsiness Headache Muscle weakness Nausea This list may not describe all possible side effects. Call your doctor for medical advice about side effects. You may report side effects to FDA at 1-800-FDA-1088. Where should I keep my medication? Keep out of the reach of children and pets. Store at room temperature between 20 and 25 degrees C (68 and 77 degrees F). Keep container tightly closed. Get rid of any unused medication after the expiration date. To get rid of medications that are no longer needed or have expired: Take the medication to a medication take-back program. Check with your pharmacy or law enforcement to find a location. If you cannot return the medication, check the label or package insert to see if the medication should be thrown out in the garbage or flushed down the toilet. If you are not sure, ask your care team. If it is safe to put it in the trash, take the medication out of the container. Mix the medication with cat litter, dirt, coffee grounds, or other unwanted substance. Seal the mixture in a bag or container. Put it in the trash. NOTE: This sheet is a summary. It may not cover all possible information. If you have questions about this medicine, talk to your doctor, pharmacist, or health care provider.  2023 Elsevier/Gold Standard (2020-05-30 00:00:00) Progressive Supranuclear Palsy Progressive supranuclear palsy is a rare brain disorder that causes problems with walking, eye movement, and balance. It may also cause depression, memory loss, and changes in behavior. Progressive supranuclear palsy happens when cells at the base of the brain  gradually become damaged. The condition gets worse with time. There is no cure for this condition. What are the causes? The cause of this condition is not known. In some cases, it may be caused by genes passed down through families. What increases the risk? You are more likely to develop this condition if: You are older than 82 years of age. You are female. What are the signs or symptoms? Symptoms of this condition include: Unexplained falls, or loss of balance while walking. Walking that is stiff and awkward. Slow movements and stiff muscles. Difficulty with eye movements, or spontaneous eye closing. Problems with memory and thinking that get worse with time (progressive dementia). Trouble swallowing and speaking. Changes in mood and behavior, such as: Lack of feeling or emotion (apathy). Irritability. Sudden laughing or crying. Personality changes. How is this diagnosed? This condition is diagnosed based on your symptoms, especially: Poor balance. Problems with eye movement. Speech changes. Mental or behavioral changes. You may also have tests to help rule out other causes of your symptoms. These include: Brain imaging tests such as an MRI or a CT scan. A barium swallow study. In this study, X-rays of your digestive system are taken after you swallow the chemical barium. Barium makes the images clear. Memory tests, also called neuropsychological testing. How is this treated? There is no cure for this condition, but treatment can help with symptoms. Treatment may include: Medicines for Parkinson's disease. These may help with slowness,  stiffness, and balance problems. Antidepressant medicines to treat depression. Walking aids, such as a walker, to prevent falls. Glasses with prisms to help with vision. A surgical procedure to place a feeding tube into your stomach (gastrostomy) if swallowing becomes very hard. Treatment involves a team of health care providers. The team may  include: A physical therapist. This person can help you identify a safe exercise program. A speech and language therapist. This person can teach you ways to swallow foods and liquids safely and how to speak more clearly. An occupational therapist. This person can teach you how to use walking aids, and ways to make your home safe. Follow these instructions at home: Take over-the-counter and prescription medicines only as told by your health care provider. Use walking aids and glasses with prisms as told by your health care provider. Work closely with your health care team and follow their instructions. Keep all follow-up visits. This is important. Where to find more information Cure PSP website: TelephoneAffiliates.pl [facility] of Neurological Disorders and Stroke: BasicFM.no Contact a health care provider if: You have chills or a fever. Your symptoms are getting worse. You develop new symptoms. You are losing weight. You are choking while eating. You have a cough that will not go away. You have shortness of breath. You are anxious or depressed. You are not getting enough support at home. Get help right away if: You have any of these problems after eating or drinking: Choking. Coughing. Trouble breathing. You fall and hurt yourself. You no longer feel safe at home. These symptoms may represent a serious problem that is an emergency. Do not wait to see if the symptoms will go away. Get medical help right away. Call your local emergency services (911 in the [place] Do not drive yourself to the hospital. Summary Progressive supranuclear palsy is a rare brain disorder that causes problems with walking, eye movement, and balance. It may also cause depression, memory loss, and changes in behavior. This condition is diagnosed based on your symptoms. There is no cure for this condition, but treatment can help relieve your symptoms. Treatment for this condition may include medicines that are used  to treat Parkinson's disease and medicines that are used to treat other symptoms like depression. Other treatments involve various therapies that help with daily living. This information is not intended to replace advice given to you by your health care provider. Make sure you discuss any questions you have with your health care provider. Document Revised: 01/06/2020 Document Reviewed: 01/06/2020 Elsevier Patient Education  2023 [employer].

## 2021-10-07 NOTE — Progress Notes (Signed)
Provider:  Larey Seat, MD    Referring Provider: Burnis Medin, MD Primary Care Physician:  Burnis Medin, MD    HPI: Dr. [name], [name]  is a 81 y.o. female patient, and seen here in a Rv for PNP.  10-07-2021, here with her caregiver Kalman Shan for the last 2 years and her husband, progressive PNP, overall stiffness, rigor, tongue deviated to the left, unable to tolerate oral examination- leading to biting down, facial expression is astonished. Mouth stays open, neck is stooped, and she pulls her shoulders up.  Very strong rigor, left over right, contracted right elbow.      Rm 11, w husband and caregiver. Here for 6 month f/u. Pts husband reports more difficulty to walk. Ambulate in WC. L arm is more stiff and unable to move as much. Pt is able to talk a bit more. Pt was having difficulty swallowing but last month pt has been able to eat without chocking. Sleeps better at night.   Rv on 02-12-2020: The patient has progressed, is left with very limited mobility, right hand is still " of use", and left is not. There is a discussion about an electric wheelchair, if that is self directed how will she use it, direct it? There is little sense in repeating MMSE. [place] has made no difference in Mr. and Mrs Arzola's opinion.  They are content, she is much less frustrated when using the tablet. Aphasia is a cause of great difficulties in communication. DVT in October , now on Eliquis.     Progressing quickly over 30 month by history and in comparison to previous MMSE/ MOCA.  mini-mental status exam tof day ended in 9 points.  Severe dementia.  The couple visited their children and grandchildren in June , by road trip- 3 weeks. Staying in hotels /motels.  Her face is masked. She can hold small- talk. Now in a wheelchair. Left sided weakness, at home PT has noted increased rigidity, left arm clumsy and left foot everted. .  04-2018 Mr. Zavada reports that their children have had Covid 19  in [place] and in [place]. There is also family in [facility] that wanted to visit , but the [facility] declined. The profile today Mini-Mental Status Examination in which the patient scored 15 points.  She presented with a slightly elevated blood pressure today to the office.  Since August she had completed physical therapy and speech therapy which has helped with some strengthening.  And prior to Christmas she had completed her physical therapy.  But after physical therapy sees she has also began again to have some ambulation difficulties.  It seems that we need an ongoing may be group exercise for her and I wonder if there could be a Zoom or WebEx video that would help to encourage her to do some of these exercises in the home environment and with some guidance and perhaps even with correction of posture or other input.  She is doing Pilates on Zoom, but its very difficult for hr to follow instructions. Patient was seen here on 02-08-2018 in a referral from Dr. Regis Bill for a "second opinion " in the work up for memory loss. The patient reports having been stumped by the previously see neurologist in [place], who reportedly diagnosed her with Alzheimer's Dementia. 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 She has a [name] and reports she had some learning disability - not learning or memory related, but  an inability to calculate.   Dr. Kizzie Furnish had always been acute aware of her learning abilities, but over the last couple of years there has been evidence of a declining cognitive ability.  The patient broke her ankle at the time and while she recovered from this supposedly only bony injury there were several changes, she felt always cold it was as if her enough thermostat had changed, she also noted and memory decline, may be a personality change, she had trouble retrieving words and her vocabulary became progressively restricted, her speech poor. She has ever since gait and balance  impairment. She is in PT twice weekly.   Family history - mother had PD.father had CAD, died at 20 years old, cancer.  Social history- [name], no history of tobacco , ETOH- used to have one glass of wine with dinner, now 2-3 days a week.  No  recreatinonal drugs. 2 sons , aged 48 and 2.    RV 04-12-2018,  Plan: Eye-movement abnormalities now most like associated with the gliosis and encephalomalcia and not neurodegeneratve. Vascular dementia.  Slowed EEG,  Abnormal gliosis or encephalomalacia in MRI - could this be an AVM? Fall related head trauma? Carotid vaso-vascular embolism?   Need MRA brain, non contrast.  Need carotid doppler first. PT, OT and ST to continue.  Rv in 2-3 month with me.   RV 10-27-2018, Rv  Due to the Coronavirus pandemia none of above tests were performed.  I explained again that the MRA is non contrast, no invasive.  New orders for ST, PT and OT will be needed. Her left hand is dominant and she has become very clumsy.   STUDY DATE: 10/29/2018 PATIENT NAME: Niley Assenmacher DOB: [birth year] MRN: [id number]   EXAM: MR angiogram of the intracranial arteries   ORDERING CLINICIAN: Asencion Partridge Shakirra Buehler MD CLINICAL HISTORY: 81 year old woman with gait ataxia and acute cognitive changes COMPARISON FILMS: None   TECHNIQUE: MR angiogram of the head was obtained utilizing 3D time of flight sequences from below the vertebrobasilar junction up to the intracranial vasculature without contrast.  Computerized reconstructions were obtained. CONTRAST: None IMAGING SITE: [facility], [place], [place], [place]   FINDINGS: The imaged extracranial and intracranial portions of the internal carotid arteries appear normal. The middle cerebral and anterior cerebral arteries appear normal.    In the posterior circulation, the right vertebral artery is dominant.  The V4 segment of the left vertebral artery is not observed in the MIP reconstructions though some flow is noted on the source  images.  Mild stenosis is noted within the proximal basilar artery.  There is a fetal origin of the left posterior cerebral artery.  Some luminal irregularity is noted in the P2 segment consistent with mild stenosis.   No aneurysms were identified.     IMPRESSION: This MR angiogram of the intracerebral arteries shows the following: 1.  Reduced flow is noted in the distal left vertebral artery.  This could be due to stenosis of the V4 segment or pre-cerebral stenosis more proximally..  If clinically indicated, consider a contrasted MR angiogram of the neck arteries to further evaluate. 2.  Mild stenosis of the proximal basilar artery.   3.  Mild stenosis of the left posterior cerebral artery which has a fetal origin (variant).         INTERPRETING PHYSICIAN:  Richard A. Felecia Shelling, MD, [name], FAAN Certified in  Neuroimaging by [facility]   NEUROIMAGING REPORT  STUDY DATE: 03/15/18 PATIENT NAME: Joleigh Radebaugh DOB: 12-13-1940 MRN: [id number]   ORDERING CLINICIAN: Larey Seat, MD  CLINICAL HISTORY: 81 year old female with memory loss and confusion.   EXAM: MRI brain (without)  TECHNIQUE: MRI of the brain without contrast was obtained utilizing 5 mm axial slices with T1, T2, T2 flair, SWI and diffusion weighted views.  T1 sagittal and T2 coronal views were obtained. CONTRAST: no (patient declined IV contrast) COMPARISON: none  IMAGING SITE: [facility] [address] (1.5 Tesla MRI)     FINDINGS:    Abnormal T2 FLAIR hyperintense signal in the right frontal, right parietal and right cerebellar regions, with encephalomalacia and gliosis.  However there also appears to be underlying heterogeneous lesions with surrounding vasogenic edema.  On SWI views, there is increased susceptibility within the rim of these regions as well indicating mineralization or hemosiderin deposition.  Considerations would include embolic chronic  ischemic infarcts, although underlying neoplasm (metastases) cannot be excluded.    Elsewhere few punctate foci of nonspecific gliosis in the subcortical white matter.   No abnormal lesions are seen on diffusion-weighted views to suggest acute ischemia. The cortical sulci, fissures and cisterns are normal in size and appearance. Lateral, third and fourth ventricle are normal in size and appearance. No extra-axial fluid collections are seen. No evidence of mass effect or midline shift.     On sagittal views the posterior fossa, pituitary gland and corpus callosum are unremarkable. No evidence of intracranial hemorrhage on SWI views. The orbits and their contents, paranasal sinuses and calvarium are unremarkable.  Intracranial flow voids are present.    IMPRESSION:  Abnormal MRI brain (without) demonstrating: - Right frontal, right parietal and right cerebellar encephalomalacia and gliosis.  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  Underlying neoplastic or vascular lesions cannot be excluded, and would recommend follow-up imaging with postcontrast views. - Mild chronic small vessel ischemic disease. - No acute findings.     INTERPRETING PHYSICIAN:  Penni Bombard, MD Certified in Neurology, Neurophysiology and Neuroimaging     Review of Systems: Out of a complete 14 system review, the patient complains of only the following symptoms, and all other reviewed systems are negative.   Left thumb sensoryloss, clumsy hand.  Mild  Dysphagia. Strong  Dysphonia.      Her face is masked. She can hold small- talk. wheelchair. Left sided increased rigidity, and left foot everted. .  progressive PNP, overall stiffness, rigor, tongue deviated to the left, unable to tolerate oral examination- leading to biting down, facial expression is astonished. Mouth stays open, neck is stooped, and she pulls her shoulders up.  Very strong rigor, left over right, contracted right elbow.     Social History   Socioeconomic History   Marital status: Married    Spouse name: Psychologist, prison and probation services   Number of children: 2   Years of education: Not on file   Highest education level: Doctorate  Occupational History   Occupation: retired    Comment: Teaching laboratory technician [name]  Tobacco Use   Smoking status: Never   Smokeless tobacco: Never  Vaping Use   Vaping Use: Never used  Substance and Sexual Activity   Alcohol use: Not Currently    Alcohol/week: 7.0 standard drinks of alcohol    Types: 7 Glasses of wine per week    Comment: one a day   Drug use: Not Currently   Sexual activity: Not on file  Other Topics Concern   Not on file  Social History Narrative   Regular exercise-yes   Moved back to [place] from [place]   Hh of 2 cat   G3 P2    Exercises walking regu;arly    Retired  Social research officer, government   Drinks one cup of coffee  A day. In addition to walking QD she and her husband take a pilates class on Thursdays. She lives with her husband in a 2 story house, though the master bedroom in on the first level.       Social Determinants of Health   Financial Resource Strain: Low Risk  (03/17/2021)   Overall Financial Resource Strain (CARDIA)    Difficulty of Paying Living Expenses: Not hard at all  Food Insecurity: No Food Insecurity (02/24/2021)   Hunger Vital Sign    Worried About Running Out of Food in the Last Year: Never true    Ran Out of Food in the Last Year: Never true  Transportation Needs: No Transportation Needs (03/17/2021)   PRAPARE - Administrator, Civil Service (Medical): No    Lack of Transportation (Non-Medical): No  Physical Activity: Inactive (02/24/2021)   Exercise Vital Sign    Days of Exercise per Week: 0 days    Minutes of Exercise per Session: 0 min  Stress: No Stress Concern Present (02/24/2021)   [employer] of Occupational Health - Occupational Stress Questionnaire    Feeling of Stress : Not at all  Social Connections: Moderately Isolated  (02/27/2020)   Social Connection and Isolation Panel [NHANES]    Frequency of Communication with Friends and Family: Never    Frequency of Social Gatherings with Friends and Family: More than three times a week    Attends Religious Services: Never    Database administrator or Organizations: No    Attends Banker Meetings: Never    Marital Status: Married  Catering manager Violence: Not At Risk (02/27/2020)   Humiliation, Afraid, Rape, and Kick questionnaire    Fear of Current or Ex-Partner: No    Emotionally Abused: No    Physically Abused: No    Sexually Abused: No    Family History  Problem Relation Age of Onset   Hypertension Mother    Parkinsonism Mother    Angina Father    Heart disease Father    Colon cancer Father    Lung cancer Father    Skin cancer Father    Cancer Father        Heart   Breast cancer Sister        over 31   Diabetes Sister    Heart disease Brother 72       triple bypass surgery   Macular degeneration Sister    Diabetes Sister        prediabetic   Hearing loss Sister        hearing problems   Vasculitis Sister     Past Medical History:  Diagnosis Date   Cyst 07/2008   Cyst on back x2 that were drained   H/O echocardiogram 02/2000   Hx of colonic polyps    Hyperlipidemia    Hypertension    PSP (progressive supranuclear palsy) (HCC)    TBI (traumatic brain injury) (HCC)    Varicose veins of both lower extremities     Past Surgical History:  Procedure Laterality Date   POLYPECTOMY     Colon   TONSILLECTOMY     TUBAL LIGATION      Current Outpatient Medications  Medication Sig Dispense Refill   atorvastatin (LIPITOR) 10 MG tablet Take 10 mg by mouth 3 (three) times a week.     citalopram (CELEXA) 10 MG/5ML suspension Take by mouth.     diphenhydramine-acetaminophen (TYLENOL PM EXTRA STRENGTH) 25-500 MG TABS tablet Take 2 tablets by mouth at bedtime as needed (for sleeping).     ELIQUIS 5 MG TABS tablet TAKE 1 TABLET  BY MOUTH TWICE A DAY FOR DVT 60 tablet 4   losartan (COZAAR) 25 MG tablet TAKE 1 TABLET BY MOUTH EVERY DAY 90 tablet 0   No current facility-administered medications for this visit.    Allergies as of 10/07/2021 - Review Complete 10/07/2021  Allergen Reaction Noted   Dust mite extract Cough 12/09/2015   Sulfonamide derivatives      Vitals: BP (!) 143/74   Pulse 92   Ht 5\' 2"  (1.575 m)   Wt 142 lb (64.4 kg)   BMI 25.97 kg/m  Last Weight:  Wt Readings from Last 1 Encounters:  10/07/21 142 lb (64.4 kg)   Last Height:   Ht Readings from Last 1 Encounters:  10/07/21 5\' 2"  (1.575 m)    Physical exam:  General: The patient is awake, alert and appears not in acute distress. The patient is well groomed. Head: Normocephalic, atraumatic. Neck is supple. Cardiovascular:  Regular rate and rhythm , without  murmurs or carotid bruit, and without distended neck veins. Respiratory: Lungs are clear to auscultation. Skin:  With evidence of edema, both ankles and below knee  Trunk: BMI is 26.1 kg/m2.   Neurologic exam : The patient is alert, but not clearly oriented to place and time.  peech is non -fluent with aphasia.  Cranial nerves: Pupils are equal and briskly reactive to light. No EOM - she moves the whole head-   Funduscopic exam without  evidence of pallor or edema. Extraocular movements  in vertical planes restricted, neither able to pursuit upwards not downwards gaze-  Last time she had horizontal planes with sharp, coarse saccades, skipping the central vision without nystagmus.  Reduced, rare blink - masked face.   Confirmed twice that she cannot follow with downward gaze- Visual fields are restricted  Facial sensation intact to fine touch. Facial motor - reduced facial mimic.  Tongue and uvula move midline.  Tongue protrusion into either cheek is normal. Shoulder shrug is normal.   Motor exam: elevated tone, symmetric muscle bulk and symmetric strength in all  extremities.  progressive PNP, overall stiffness, rigor, tongue deviated to the left, unable to tolerate oral examination- leading to biting down, facial expression is astonished. Mouth stays open, neck is stooped, and she pulls her shoulders up.  Very strong rigor, left over right, contracted right elbow.   Coordination: , slow. Gait and station: Patient in a wheelchair. Strength delayed -gait appeared stiff-   Deep tendon reflexes: in the upper and lower extremities are reaminating brisk.  Babinski maneuver response is  downgoing.  Assessment:  After physical and neurologic examination, review of laboratory studies, imaging, neurophysiology testing and pre-existing records, assessment is that of :  Neurodegenerative disease process - as we ruled out primary vascular injuries.  - Gliosis confirms the trauma is most likely the cause of scars unclear if these caused the dementia , but they apply to the hemilateral findings. - see MRI brain.  Progressive - oculomotor palsy, now not able to look up or downwards but also not moving eyes on the horizontal plane.  increased muscle tone, rigidity  and propulsive fall risk now wheelchair bound . 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  Left thumb clumsiness. Now clearly increased rigidity in the left wrist, biceps. Hyperreflexia over both patellae.  DX : progressive degenerative CNS disorder,  PNP, overall stiffness, rigor, tongue deviated to the left, unable to tolerate oral examination- leading to biting down, facial expression is astonished.  Mouth stays open, neck is stooped, and she pulls her shoulders up.  Very strong rigor, left over right, contracted right elbow.     .     08/12/2020   11:38 AM 10/12/2019    9:29 AM 03/27/2019    3:07 PM 02/08/2018    1:52 PM  MMSE - Mini Mental State Exam  Not completed: Unable to complete     Orientation to time  0 2 1  Orientation to Place  0 2 5  Registration  3 3 3    Attention/ Calculation  0 0 0  Recall  2 2 1   Language- name 2 objects  1 2 2   Language- repeat  1 1 0  Language- follow 3 step command  2 3 3   Language- read & follow direction  0 0 1  Write a sentence  0 0 0  Copy design  0 0 0  Total score  9 15 16       Progressive Supranuclear ocular palsy-  Aphasia, masked face.  Duration 35 minutes.   Plan:  Treatment plan and additional workup :   Wheelchair  dependent patient, will be fitted for the right chair with the help of PT. Local vendor.  She will need a lift chair.   Home health care was finally accepted by the patient, 40 hours a week.   Her left hand is dominant and she has become unable to use the dominant left hand Her voice is dysphonic and slurred, slowed.  Her face is masked.progressive PNP, overall stiffness, rigor, tongue deviated to the left, unable to tolerate oral examination- leading to biting down, facial expression is astonished. Mouth stays open, neck is stooped, and she pulls her shoulders up.  Very strong rigor, left over right, contracted right elbow.   PLan :  Dr. Kizzie Furnish to try a message therapy for neck and shoulder.  Needs to be in a seated position.  Dr Krista Blue to evaluate Dr Kizzie Furnish for possible Botox. Will  try a muscle relaxant, Baclofen 2 10 mg po up to TID, and ordered that today.   Rv in 6 months / Either by MD or  by NP,   I will be available for consultation.  Asencion Partridge Prisilla Kocsis MD 10/07/2021

## 2021-10-12 ENCOUNTER — Other Ambulatory Visit: Payer: Self-pay | Admitting: Internal Medicine

## 2021-10-14 ENCOUNTER — Telehealth: Payer: Self-pay | Admitting: Neurology

## 2021-10-14 NOTE — Telephone Encounter (Signed)
Called and spoke w/ husband. Relayed I spoke w/ Dr. Vickey Huger who recommends she continue off of baclofen and keep upcoming consult visit w/ Dr. Terrace Arabia on 11/11/21. He verbalized understanding and appreciation.

## 2021-10-14 NOTE — Telephone Encounter (Signed)
Took call from phone staff and spoke w/ Pamela Alvarez/[facility]. Reports she spoke w/ husband who reported he gave his wife baclofen 10mg  for the first time this past weekend for about a day and a half. After taking, husband states wife became unresponsive/lethargic, would not eat/drink. He had her stop med Sunday. He is now able to feed her, but she is still having some issues swallowing. Aware we will run this by MD and f/u with husband.

## 2021-11-11 ENCOUNTER — Institutional Professional Consult (permissible substitution): Payer: Medicare PPO | Admitting: Neurology

## 2021-11-14 ENCOUNTER — Other Ambulatory Visit: Payer: Self-pay | Admitting: Internal Medicine

## 2021-11-17 ENCOUNTER — Ambulatory Visit: Admitting: Neurology

## 2021-11-17 ENCOUNTER — Encounter: Payer: Self-pay | Admitting: Neurology

## 2021-11-17 VITALS — BP 149/71 | HR 93

## 2021-11-17 DIAGNOSIS — I639 Cerebral infarction, unspecified: Secondary | ICD-10-CM | POA: Diagnosis not present

## 2021-11-17 DIAGNOSIS — G20 Parkinson's disease: Secondary | ICD-10-CM | POA: Diagnosis not present

## 2021-11-17 NOTE — Progress Notes (Signed)
Chief Complaint  Patient presents with   New Patient (Initial Visit)    Rm 12 with husband and caregiver. Here for consult on botox injection on left arm. Referred by Dr. Brett Fairy.       ASSESSMENT AND PLAN  Pamela Alvarez is a 81 y.o. female    Rapid progression central nervous system degenerative disorder  Presumptively progressive supranuclear palsy History of stroke, involving right frontal, parietal region, likely embolic event Lower extremity DVT, on Eliquis treatment,  Patient presented with significant rigidity, spasticity, fixed  neck mild retrocollis, tension of bilateral upper trapezius, limited range of motion of cervical spine, significant spasticity of bilateral upper extremity, left worse than right, tenderness upon passive stretch and deep palpitation,  Discussed with her husband, will try electrical stimulation guided xeomin injection, preauthorization for 600 units  Return to clinic in 1 months for injection  DIAGNOSTIC DATA (LABS, IMAGING, TESTING) - I reviewed patient records, labs, notes, testing and imaging myself where available.   MEDICAL HISTORY:  Pamela Alvarez, is a 81 year old female, accompanied by her caregiver, husband for evaluation of botulism toxin injection for spastic upper extremities, she is referred by   Larey Seat, MD [place]  I reviewed and summarized the referring note. PMHX HLD HTN  Patient was noted to have gradual onset of memory loss around 2018, was seen by Dr. Brett Fairy in 2019, over the next few years, she declined quickly, from independent now to wheelchair,  She carries a diagnosis of central nervous system degenerative disorder, likely progressive supranuclear palsy, especially since 2023, she developed significant spasticity of bilateral upper extremity, left worse than right, complains of pain when putting on shoes, passive stretching her arms, also developed fixed retrocollis, difficult to move her neck,  She is  now totally dependent on family taking care of her, still can communicate with family with short sentence,  Reviewed MRI of the brain without contrast Jan 2020 with her husband: Right frontal, parietal, cerebellar encephalomalacia and gliosis, most likely represent chronic embolic ischemic infarction, scattered small vessel disease  She is already on anticoagulation due to history of DVT,  Doppler study in February 2023, left lower extremity showed thrombosis in the left common femoral, saphenofemoral junction, proximal greater saphenous vein, femoral vein, popliteal vein,, no significant change in chronic thrombosis compared to previous study   PHYSICAL EXAM:   Vitals:   11/17/21 1313  BP: (!) 149/71  Pulse: 93   Not recorded     There is no height or weight on file to calculate BMI.  PHYSICAL EXAMNIATION:  Gen: NAD, conversant, well nourised, well groomed                     Cardiovascular: Regular rate rhythm, no peripheral edema, warm, nontender. Eyes: Conjunctivae clear without exudates or hemorrhage Neck: Supple, no carotid bruits. Pulmonary: Clear to auscultation bilaterally   NEUROLOGICAL EXAM:  MENTAL STATUS: Speech/cognition: Patient sitting in wheelchair, started looking on her face, mild left retrocollis, could not move for neck voluntarily, always passive movement, significant muscle tension at posterior cervical and bilateral upper trapezius, Responsive to conversation, but could not speak full sentence, only youngster with smile, and 1 word, slurred speech  CRANIAL NERVES: CN II: Pupils reactive to light CN III, IV, VI: On horizontal tracking, smooth pursuit was breaking to small catch up saccade eye, Limited vertical eye movement CN V: Facial sensation is intact to light touch CN VII: Face is symmetric with normal eye closure  CN VIII: Hearing is normal to causal conversation. CN IX, X: slurred speech CN XI: could not turn her head by command  MOTOR: No  spontaneous activity of limbs, tendency to hold left arm in elbow flexion, significant muscle tension at bilateral pectoralis major, left worse than right, limited range of motion of left elbow, complains of pain with passive stretch,  Moderate spasticity of bilateral lower extremity, full range of motion of bilateral knee  REFLEXES: Reflexes are 2+ and symmetric at the biceps, triceps, knees, and ankles.   SENSORY: Withdraw to pain.  COORDINATION: There is no trunk or limb dysmetria noted.  GAIT/STANCE: Deferred  REVIEW OF SYSTEMS:  Full 14 system review of systems performed and notable only for as above All other review of systems were negative.   ALLERGIES: Allergies  Allergen Reactions   Dust Mite Extract Cough   Baclofen Other (See Comments)    Lethargic/unresponsive, would not eat/drink   Sulfonamide Derivatives     HOME MEDICATIONS: Current Outpatient Medications  Medication Sig Dispense Refill   atorvastatin (LIPITOR) 10 MG tablet TAKE 1 TABLET BY MOUTH EVERY DAY 90 tablet 0   citalopram (CELEXA) 10 MG/5ML suspension Take by mouth.     diphenhydramine-acetaminophen (TYLENOL PM EXTRA STRENGTH) 25-500 MG TABS tablet Take 2 tablets by mouth at bedtime as needed (for sleeping).     ELIQUIS 5 MG TABS tablet TAKE 1 TABLET BY MOUTH TWICE A DAY FOR DVT 60 tablet 4   losartan (COZAAR) 25 MG tablet TAKE 1 TABLET BY MOUTH EVERY DAY 90 tablet 0   No current facility-administered medications for this visit.    PAST MEDICAL HISTORY: Past Medical History:  Diagnosis Date   Cyst 07/2008   Cyst on back x2 that were drained   H/O echocardiogram 02/2000   Hx of colonic polyps    Hyperlipidemia    Hypertension    PSP (progressive supranuclear palsy) (HCC)    TBI (traumatic brain injury) (HCC)    Varicose veins of both lower extremities     PAST SURGICAL HISTORY: Past Surgical History:  Procedure Laterality Date   POLYPECTOMY     Colon   TONSILLECTOMY     TUBAL LIGATION       FAMILY HISTORY: Family History  Problem Relation Age of Onset   Hypertension Mother    Parkinsonism Mother    Angina Father    Heart disease Father    Colon cancer Father    Lung cancer Father    Skin cancer Father    Cancer Father        Heart   Breast cancer Sister        over 24   Diabetes Sister    Heart disease Brother 4       triple bypass surgery   Macular degeneration Sister    Diabetes Sister        prediabetic   Hearing loss Sister        hearing problems   Vasculitis Sister     SOCIAL HISTORY: Social History   Socioeconomic History   Marital status: Married    Spouse name: Actor   Number of children: 2   Years of education: Not on file   Highest education level: Doctorate  Occupational History   Occupation: retired    Comment: Social research officer, government [name]  Tobacco Use   Smoking status: Never   Smokeless tobacco: Never  Vaping Use   Vaping Use: Never used  Substance and Sexual Activity  Alcohol use: Not Currently    Alcohol/week: 7.0 standard drinks of alcohol    Types: 7 Glasses of wine per week    Comment: one a day   Drug use: Not Currently   Sexual activity: Not on file  Other Topics Concern   Not on file  Social History Narrative   Regular exercise-yes   Moved back to [place] from [place]   Hh of 2 cat   G3 P2    Exercises walking regu;arly    Retired  Social research officer, government   Drinks one cup of coffee  A day. In addition to walking QD she and her husband take a pilates class on Thursdays. She lives with her husband in a 2 story house, though the master bedroom in on the first level.       Social Determinants of Health   Financial Resource Strain: Low Risk  (03/17/2021)   Overall Financial Resource Strain (CARDIA)    Difficulty of Paying Living Expenses: Not hard at all  Food Insecurity: No Food Insecurity (02/24/2021)   Hunger Vital Sign    Worried About Running Out of Food in the Last Year: Never true    Ran Out of Food in the Last Year:  Never true  Transportation Needs: No Transportation Needs (03/17/2021)   PRAPARE - Administrator, Civil Service (Medical): No    Lack of Transportation (Non-Medical): No  Physical Activity: Inactive (02/24/2021)   Exercise Vital Sign    Days of Exercise per Week: 0 days    Minutes of Exercise per Session: 0 min  Stress: No Stress Concern Present (02/24/2021)   [employer] of Occupational Health - Occupational Stress Questionnaire    Feeling of Stress : Not at all  Social Connections: Moderately Isolated (02/27/2020)   Social Connection and Isolation Panel [NHANES]    Frequency of Communication with Friends and Family: Never    Frequency of Social Gatherings with Friends and Family: More than three times a week    Attends Religious Services: Never    Database administrator or Organizations: No    Attends Banker Meetings: Never    Marital Status: Married  Catering manager Violence: Not At Risk (02/27/2020)   Humiliation, Afraid, Rape, and Kick questionnaire    Fear of Current or Ex-Partner: No    Emotionally Abused: No    Physically Abused: No    Sexually Abused: No      Levert Feinstein, M.D. Ph.D.  [facility] [address] [place], [place] [address] Ph: [phone] Fax: [phone]  CC:  Dohmeier, Porfirio Mylar, MD [address] [place],  [place] [address]  Panosh, Neta Mends, MD

## 2021-11-20 ENCOUNTER — Telehealth: Payer: Self-pay

## 2021-11-20 NOTE — Telephone Encounter (Signed)
Xeomin Start form for 600 units   Jcode: [id number] & [id number]  Chemical Dnervation of Limbs and Trunk Muscle Codes:  V6106763, [id number], [id number], and 66440  Dx cod G82.50

## 2021-12-08 NOTE — Telephone Encounter (Signed)
Submitted a Non-formulary Prior Authorization request to [facility] for  Xeomin  via CoverMyMeds. Will update once we receive a response.   KeyCorky Sox - PA Case ID: [id number]

## 2021-12-09 ENCOUNTER — Other Ambulatory Visit (HOSPITAL_COMMUNITY): Payer: Self-pay

## 2021-12-09 NOTE — Telephone Encounter (Signed)
Patient Advocate Encounter  Prior Authorization for Xeomin 100UNIT solution has been approved.    PA# [id number] Effective dates: 12/08/2021 through 03/08/2022  Buy and Elton Sin, [place] Patient Advocate Specialist [place] Patient Advocate Team Direct Number: [phone]  Fax: [phone]

## 2021-12-29 ENCOUNTER — Encounter: Payer: Medicare PPO | Admitting: Internal Medicine

## 2022-01-13 ENCOUNTER — Other Ambulatory Visit: Payer: Self-pay | Admitting: Internal Medicine

## 2022-01-15 ENCOUNTER — Other Ambulatory Visit: Payer: Self-pay | Admitting: Internal Medicine

## 2022-01-19 ENCOUNTER — Encounter: Payer: Self-pay | Admitting: Internal Medicine

## 2022-01-19 ENCOUNTER — Ambulatory Visit (INDEPENDENT_AMBULATORY_CARE_PROVIDER_SITE_OTHER): Payer: Medicare PPO | Admitting: Internal Medicine

## 2022-01-19 VITALS — BP 120/82 | HR 85 | Temp 97.1°F | Ht 62.0 in | Wt 148.0 lb

## 2022-01-19 DIAGNOSIS — Z7901 Long term (current) use of anticoagulants: Secondary | ICD-10-CM | POA: Diagnosis not present

## 2022-01-19 DIAGNOSIS — I825Y2 Chronic embolism and thrombosis of unspecified deep veins of left proximal lower extremity: Secondary | ICD-10-CM

## 2022-01-19 DIAGNOSIS — Z0000 Encounter for general adult medical examination without abnormal findings: Secondary | ICD-10-CM

## 2022-01-19 DIAGNOSIS — G231 Progressive supranuclear ophthalmoplegia [Steele-Richardson-Olszewski]: Secondary | ICD-10-CM | POA: Diagnosis not present

## 2022-01-19 DIAGNOSIS — I10 Essential (primary) hypertension: Secondary | ICD-10-CM

## 2022-01-19 DIAGNOSIS — Z79899 Other long term (current) drug therapy: Secondary | ICD-10-CM | POA: Diagnosis not present

## 2022-01-19 DIAGNOSIS — E785 Hyperlipidemia, unspecified: Secondary | ICD-10-CM

## 2022-01-19 MED ORDER — LOSARTAN POTASSIUM 25 MG PO TABS
25.0000 mg | ORAL_TABLET | Freq: Every day | ORAL | 2 refills | Status: AC
Start: 2022-01-19 — End: ?

## 2022-01-19 NOTE — Progress Notes (Unsigned)
Chief Complaint  Patient presents with   Annual Exam    HPI: Patient  Pamela Alvarez  81 y.o. comes in today for Preventive Health Care visit  yearly visit  with spouse and [name] attendant  She is in home care for SNP progressive   Current level of funcitoning  non verbal no transfer   Specialty team  Dr Dohmier  Urology high fill NP Neuro DR [place] atypical parkinsons  consult for poss boto for arm  left contracture  Nn verbal now but will be alert   now has contractures  and stiffness     Rapid progression central nervous system degenerative disorder             Presumptively progressive supranuclear palsy History of stroke, involving right frontal, parietal region, likely embolic event Lower extremity DVT, on Eliquis treatment,             Patient presented with significant rigidity, spasticity, fixed  neck mild retrocollis, tension of bilateral upper trapezius, limited range of motion of cervical spine, significant spasticity of bilateral upper extremity, left worse than right, tenderness upon passive stretch and deep palpitation,             Discussed with her husband, will try electrical stimulation guided xeomin injection, preauthorization for 600 units  Health Maintenance  Topic Date Due   TETANUS/TDAP  07/08/2018   COVID-19 Vaccine (6 - Pfizer risk series) 03/04/2022   Pneumonia Vaccine 11+ Years old  Completed   INFLUENZA VACCINE  Completed   Zoster Vaccines- Shingrix  Completed   HPV VACCINES  Aged Out   DEXA SCAN  Discontinued   COLONOSCOPY (Pts 45-78yrs Insurance coverage will need to be confirmed)  Discontinued   Health Maintenance Review Bed wc bound does get outside    ROS:  REST of 12 system review negative except as per HPI no resp sx currently    Past Medical History:  Diagnosis Date   Cyst 07/2008   Cyst on back x2 that were drained   H/O echocardiogram 02/2000   Hx of colonic polyps    Hyperlipidemia    Hypertension    PSP (progressive supranuclear  palsy) (HCC)    TBI (traumatic brain injury) (HCC)    Varicose veins of both lower extremities     Past Surgical History:  Procedure Laterality Date   POLYPECTOMY     Colon   TONSILLECTOMY     TUBAL LIGATION      Family History  Problem Relation Age of Onset   Hypertension Mother    Parkinsonism Mother    Angina Father    Heart disease Father    Colon cancer Father    Lung cancer Father    Skin cancer Father    Cancer Father        Heart   Breast cancer Sister        over 25   Diabetes Sister    Heart disease Brother 32       triple bypass surgery   Macular degeneration Sister    Diabetes Sister        prediabetic   Hearing loss Sister        hearing problems   Vasculitis Sister     Social History   Socioeconomic History   Marital status: Married    Spouse name: [name]   Number of children: 2   Years of education: Not on file   Highest education level: Doctorate  Occupational History  Occupation: retired    Comment: Social research officer, governmentchool psychologist [name]  Tobacco Use   Smoking status: Never   Smokeless tobacco: Never  Vaping Use   Vaping Use: Never used  Substance and Sexual Activity   Alcohol use: Not Currently    Alcohol/week: 7.0 standard drinks of alcohol    Types: 7 Glasses of wine per week    Comment: one a day   Drug use: Not Currently   Sexual activity: Not on file  Other Topics Concern   Not on file  Social History Narrative   Regular exercise-yes   [place]oved back to [place] from [place]   Hh of 2 cat   G3 P2    Exercises walking regu;arly    Retired  Social research officer, governmentchool psychologist   Drinks one cup of coffee  A day. In addition to walking QD she and her husband take a pilates class on Thursdays. She lives with her husband in a 2 story house, though the master bedroom in on the first level.       Social Determinants of Health   Financial Resource Strain: Low Risk  (03/17/2021)   Overall Financial Resource Strain (CARDIA)    Difficulty of Paying Living Expenses: Not hard  at all  Food Insecurity: No Food Insecurity (02/24/2021)   Hunger Vital Sign    Worried About Running Out of Food in the Last Year: Never true    Ran Out of Food in the Last Year: Never true  Transportation Needs: No Transportation Needs (03/17/2021)   PRAPARE - Administrator, Civil ServiceTransportation    Lack of Transportation (Medical): No    Lack of Transportation (Non-Medical): No  Physical Activity: Inactive (02/24/2021)   Exercise Vital Sign    Days of Exercise per Week: 0 days    Minutes of Exercise per Session: 0 min  Stress: No Stress Concern Present (02/24/2021)   [employer] of Occupational Health - Occupational Stress Questionnaire    Feeling of Stress : Not at all  Social Connections: Moderately Isolated (02/27/2020)   Social Connection and Isolation Panel [NHANES]    Frequency of Communication with Friends and Family: Never    Frequency of Social Gatherings with Friends and Family: More than three times a week    Attends Religious Services: Never    Database administratorActive Member of Clubs or Organizations: No    Attends Engineer, structuralClub or Organization Meetings: Never    Marital Status: Married    Outpatient Medications Prior to Visit  Medication Sig Dispense Refill   apixaban (ELIQUIS) 5 MG TABS tablet TAKE 1 TABLET BY MOUTH TWICE A DAY FOR CIRCULATION 60 tablet 0   atorvastatin (LIPITOR) 10 MG tablet Take 10 mg by mouth. 1 tablet three times a week.     citalopram (CELEXA) 10 MG/5ML suspension Take by mouth.     diphenhydramine-acetaminophen (TYLENOL PM EXTRA STRENGTH) 25-500 MG TABS tablet Take 2 tablets by mouth at bedtime as needed (for sleeping).     LORazepam (ATIVAN) 0.5 MG tablet Take 0.25 mg by mouth 2 (two) times daily.     polyethylene glycol powder (GLYCOLAX/MIRALAX) 17 GM/SCOOP powder SMARTSIG:17 Gram(s) By Mouth Daily PRN     losartan (COZAAR) 25 MG tablet TAKE 1 TABLET BY MOUTH EVERY DAY 90 tablet 0   No facility-administered medications prior to visit.     EXAM:  BP 120/82 (BP Location: Left  Arm, Patient Position: Sitting, Cuff Size: Normal)   Pulse 85   Temp (!) 97.1 F (36.2 C) (Other (Comment)) Comment (Src): temporal  Ht  5\' 2"  (1.575 m)   Wt 148 lb (67.1 kg)   SpO2 96%   BMI 27.07 kg/m   Body mass index is 27.07 kg/m. Wt Readings from Last 3 Encounters:  01/19/22 148 lb (67.1 kg)  10/07/21 142 lb (64.4 kg)  06/09/21 152 lb (68.9 kg)    Physical Exam: Vital signs reviewed 08/09/21 is a well-developed well-nourished  non verbal stiff masked  in WC  ocass utterance  HEENT: normocephalic atraumatic ,tm  no  acute findings  NECK: without masses, thyromegaly or bruits. CHEST/PULM:  Clear to auscultation and percussion breath sounds equal no wheeze , rales or rhonchi.  CV: PMI is nondisplaced, S1 S2 no gallops, murmurs, rubs. Peripheral pulsespresent  Extremities:  No clubbing cyanosis or edema, no acute joint swelling or redness upper arms stiffness and left 90 degree contracted  NEURO:   non verbal not self moving   upright in wc  SKIN: No acute rashes normal turgor, color, no bruising or petechiae no drooling or asymmetry otherwise  LN: no cervical adenopathy  Lab Results  Component Value Date   WBC 9.4 03/09/2021   HGB 13.4 03/09/2021   HCT 41.5 03/09/2021   PLT 275 03/09/2021   GLUCOSE 128 (H) 03/09/2021   CHOL 222 (H) 12/25/2020   TRIG 85.0 12/25/2020   HDL 75.60 12/25/2020   LDLDIRECT 191.8 07/22/2012   LDLCALC 129 (H) 12/25/2020   ALT 33 03/09/2021   AST 30 03/09/2021   NA 141 03/09/2021   K 3.8 03/09/2021   CL 108 03/09/2021   CREATININE 1.05 (H) 03/09/2021   BUN 21 03/09/2021   CO2 26 03/09/2021   TSH 3.27 12/25/2020   HGBA1C 5.3 12/25/2020    BP Readings from Last 3 Encounters:  01/19/22 120/82  11/17/21 (!) 149/71  10/07/21 (!) 143/74    Lab plan  reviewed with spouse if needed  ASSESSMENT AND PLAN:  Discussed the following assessment and plan:    ICD-10-CM   1. Visit for preventive health examination  Z00.00     2. Medication  management  Z79.899 Basic metabolic panel    CBC with Differential/Platelet    Lipid panel    3. Chronic anticoagulation  Z79.01 Basic metabolic panel    CBC with Differential/Platelet    Lipid panel    4. Progressive supranuclear palsy (HCC)  G23.1 Basic metabolic panel    CBC with Differential/Platelet    Lipid panel    5. Chronic deep vein thrombosis (DVT) of proximal vein of left lower extremity (HCC)  I82.5Y2 Basic metabolic panel    CBC with Differential/Platelet    Lipid panel    6. Hyperlipidemia, unspecified hyperlipidemia type  E78.5 Basic metabolic panel    CBC with Differential/Platelet    Lipid panel    7. Essential hypertension  I10 Basic metabolic panel    CBC with Differential/Platelet    Lipid panel      Discussed lab monitoring if appropriate due January if yearly Refill losartan low-dose as keeps blood pressure control She is on 3 days a week atorvastatin this could be discontinued but husband is not quite ready to stop test at this time She is on continued anticoagulation because of history of undetermined cause of DVT no active bleeding or falling Consideration of decreasing dose to 2.5 twice daily She has excellent physical care between [facility] and husband care. Return for  as needed   as planned .  Patient Care Team: [facility], MD as PCP - General  Bernette Redbird, MD (Gastroenterology) Elise Benne, MD (Ophthalmology) Venancio Poisson, MD as Attending Physician (Dermatology) Enid Baas, MD as Consulting Physician (Sports Medicine) Teryl Lucy, MD as Consulting Physician (Orthopedic Surgery) Andrena Mews, DO as Consulting Physician (Sports Medicine) Sedalia Muta, PT as Physical Therapist (Physical Therapy) Dohmeier, Porfirio Mylar, MD as Consulting Physician (Neurology) Verner Chol, [facility] as Pharmacist (Pharmacist) Patient Instructions  Good to see  you today,.   Will place future order for January   med monitoring  Make lab orders  for   future . Refill losartan  today .   Neta Mends. Bryar Dahms M.D.

## 2022-01-19 NOTE — Patient Instructions (Addendum)
Good to see  you today,.   Will place future order for January   med monitoring  Make lab orders  for  future . Refill losartan  today .

## 2022-02-07 ENCOUNTER — Other Ambulatory Visit: Payer: Self-pay | Admitting: Internal Medicine

## 2022-02-12 ENCOUNTER — Other Ambulatory Visit: Payer: Self-pay | Admitting: Internal Medicine

## 2022-02-12 ENCOUNTER — Other Ambulatory Visit: Payer: Self-pay

## 2022-02-12 MED ORDER — ATORVASTATIN CALCIUM 10 MG PO TABS: 10.0000 mg | ORAL_TABLET | ORAL | 0 refills | Status: DC

## 2022-02-16 ENCOUNTER — Telehealth: Payer: Self-pay | Admitting: *Deleted

## 2022-02-16 ENCOUNTER — Encounter: Payer: Self-pay | Admitting: *Deleted

## 2022-02-16 NOTE — Patient Outreach (Signed)
  Care Coordination   Initial Visit Note   02/16/2022 Name: Emalene Welte MRN: [id number] DOB: August 22, 1940  Corrinne Benegas is a 81 y.o. year old female who sees Panosh, Neta Mends, MD for primary care. I spoke with  [name] by phone today.  What matters to the patients health and wellness today?  No needs    Goals Addressed               This Visit's Progress     COMPLETED: No needs (pt-stated)        Care Coordination Interventions: Reviewed scheduled/upcoming provider appointments including sufficient transportation to all medical appointments Assessed social determinant of health barriers Care management services introduced for any case management needs. No needs presented at this time.          SDOH assessments and interventions completed:  Yes  SDOH Interventions Today    Flowsheet Row Most Recent Value  SDOH Interventions   Food Insecurity Interventions Intervention Not Indicated  Housing Interventions Intervention Not Indicated  Transportation Interventions Intervention Not Indicated  Utilities Interventions Intervention Not Indicated        Care Coordination Interventions:  Yes, provided   Follow up plan: No further intervention required.   Encounter Outcome:  Pt. Visit Completed   Elliot Cousin, RN Care Management Coordinator [facility] Main Office [phone]

## 2022-02-16 NOTE — Patient Instructions (Signed)
Visit Information  Thank you for taking time to visit with me today. Please don't hesitate to contact me if I can be of assistance to you.   Following are the goals we discussed today:   Goals Addressed               This Visit's Progress     COMPLETED: No needs (pt-stated)        Care Coordination Interventions: Reviewed scheduled/upcoming provider appointments including sufficient transportation to all medical appointments Assessed social determinant of health barriers Care management services introduced for any case management needs. No needs presented at this time.          Please call the care guide team at [phone] if you need to cancel or reschedule your appointment.   If you are experiencing a Mental Health or Behavioral Health Crisis or need someone to talk to, please call the Suicide and Crisis Lifeline: 988 call the [place] National Suicide Prevention Lifeline: 581-676-9469 or TTY: (209)032-7336 TTY 865-678-3800) to talk to a trained counselor call 1-800-273-TALK (toll free, 24 hour hotline)  Patient verbalizes understanding of instructions and care plan provided today and agrees to view in MyChart. Active MyChart status and patient understanding of how to access instructions and care plan via MyChart confirmed with patient.     No further follow up required: No needs presented at this time.    Elliot Cousin, RN Care Management Coordinator [facility] Main Office [phone]

## 2022-02-27 IMAGING — CT CT ABD-PELV W/ CM
2 of 5 series · 16 of 46 positions shown, 18 images · IV contrast (APPLIED)
Comparison: None.

CLINICAL DATA: Abdominal pain.

EXAM:
CT ABDOMEN AND PELVIS WITH CONTRAST
TECHNIQUE: Multidetector CT imaging of the abdomen and pelvis was performed
using the standard protocol following bolus administration of
intravenous contrast.
CONTRAST:  100mL OMNIPAQUE IOHEXOL 300 MG/ML  SOLN

[Series 2: abd pel w · axial · 0.73mm/px · z∈[+669,+1104]mm · 13 of 97 slices shown, 15 images]
[im 5/97  soft-tissue]
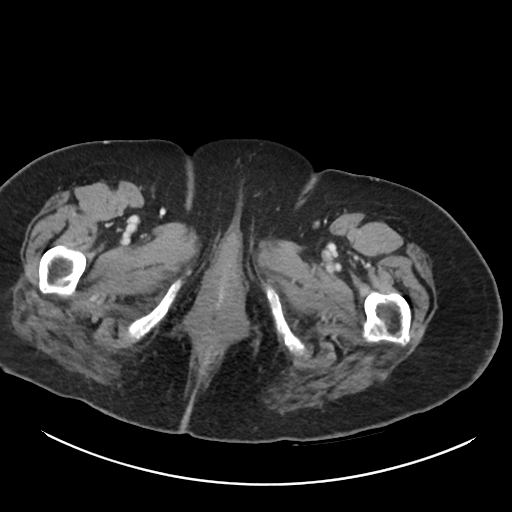
[im 5/97  bone]
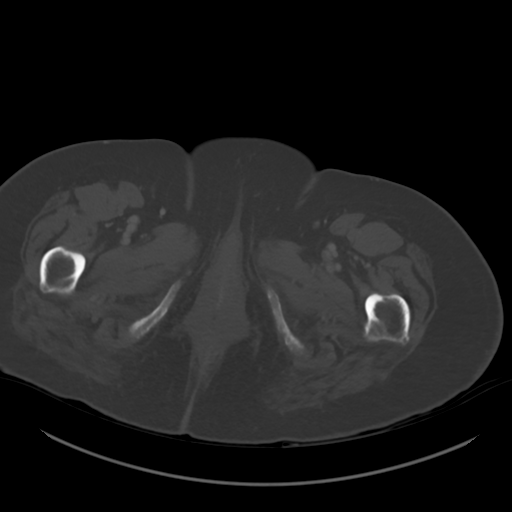
[im 15/97  soft-tissue]
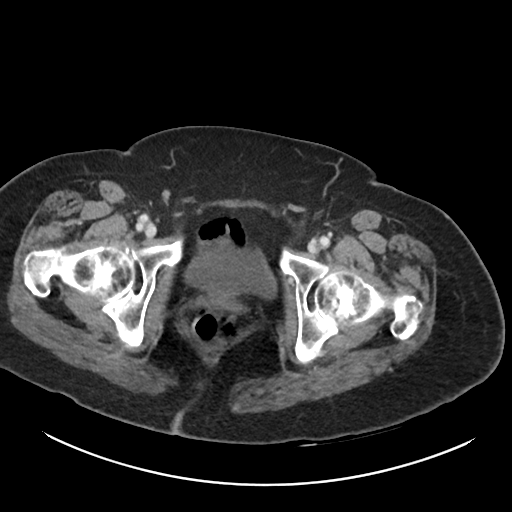
[im 20/97  soft-tissue]
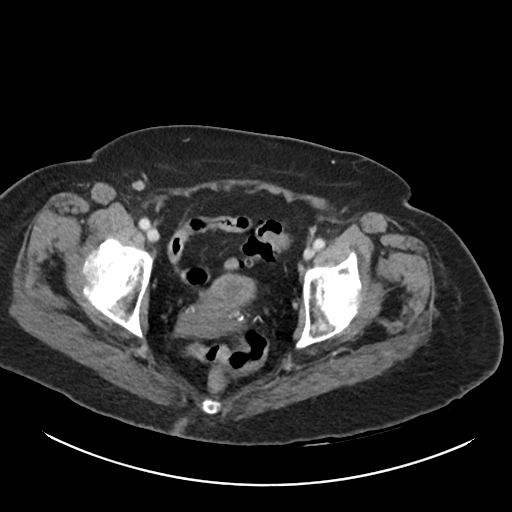
[im 29/97  soft-tissue]
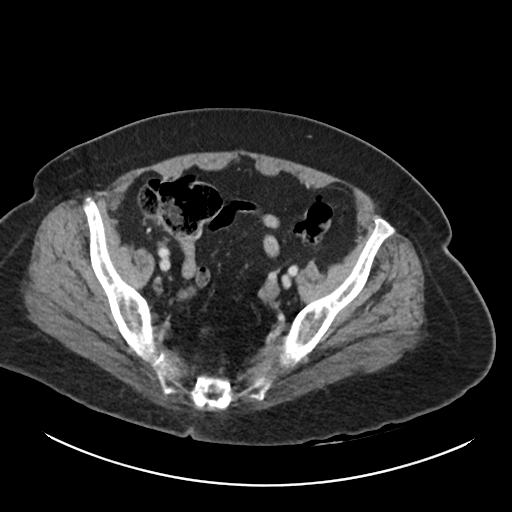
[im 34/97  soft-tissue]
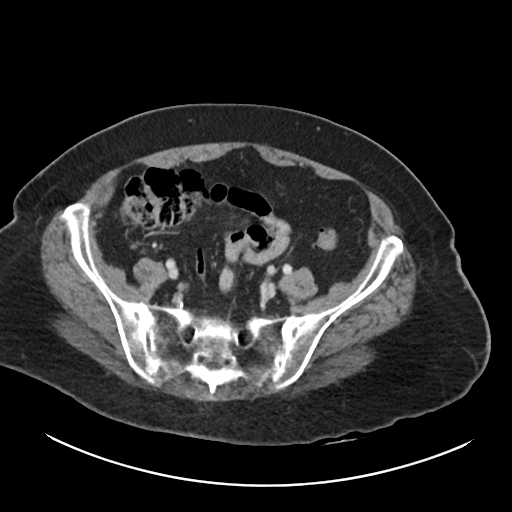
[im 44/97  soft-tissue]
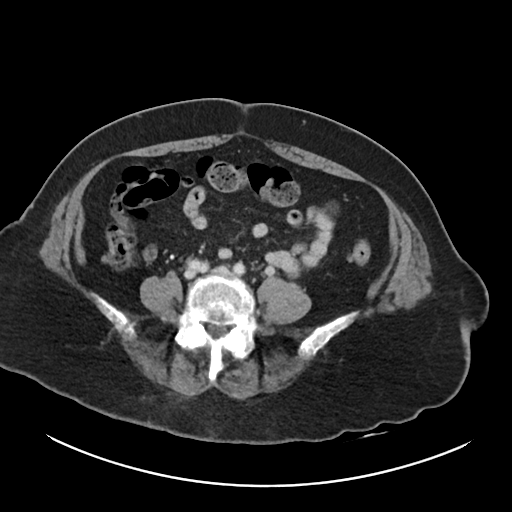
[im 49/97  soft-tissue]
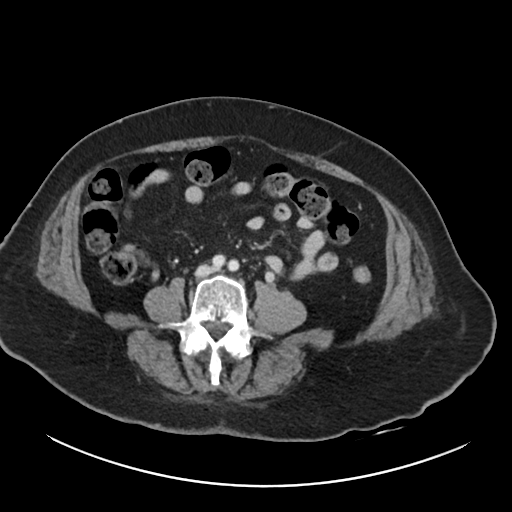
[im 53/97  soft-tissue]
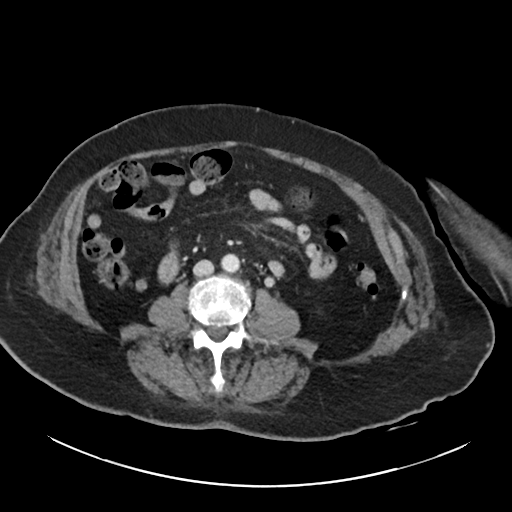
[im 63/97  soft-tissue]
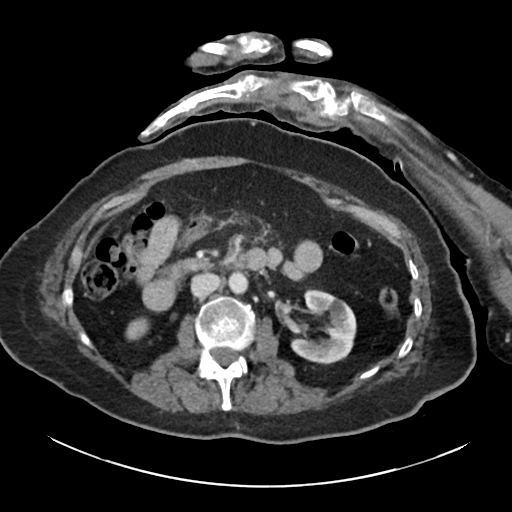
[im 63/97  bone]
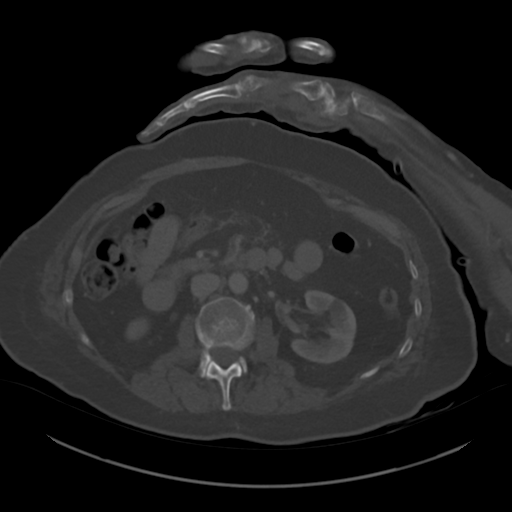
[im 68/97  soft-tissue]
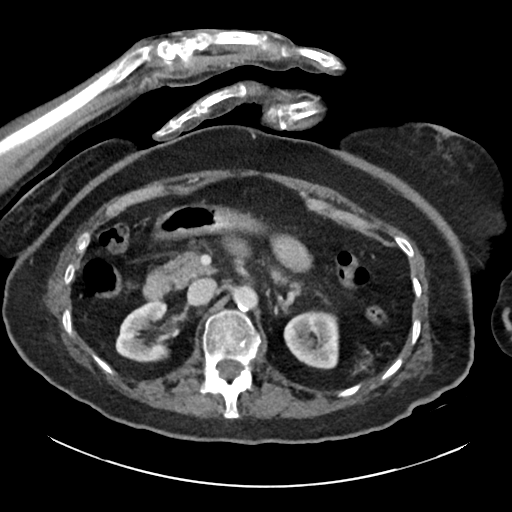
[im 77/97  soft-tissue]
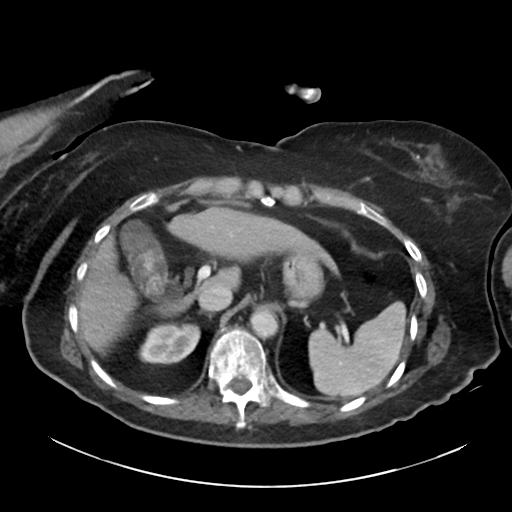
[im 82/97  soft-tissue]
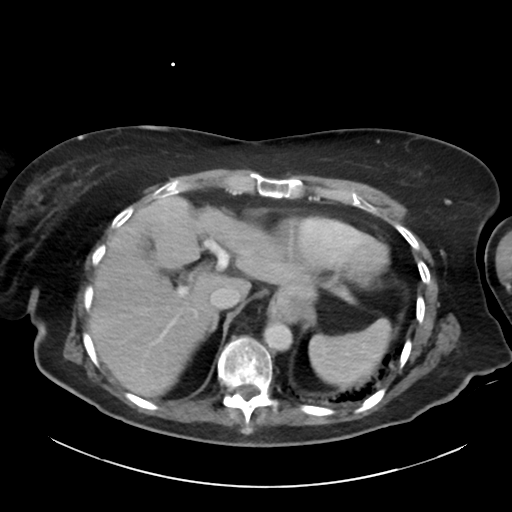
[im 92/97  soft-tissue]
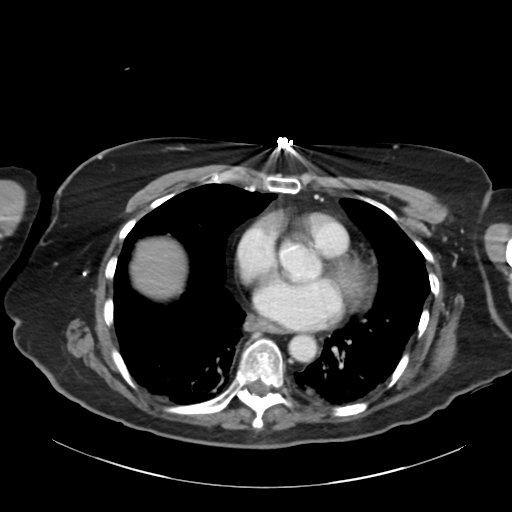

[Series 5: coronal · coronal · 0.84mm/px · 3 of 102 slices shown]
[im 34/102  soft-tissue]
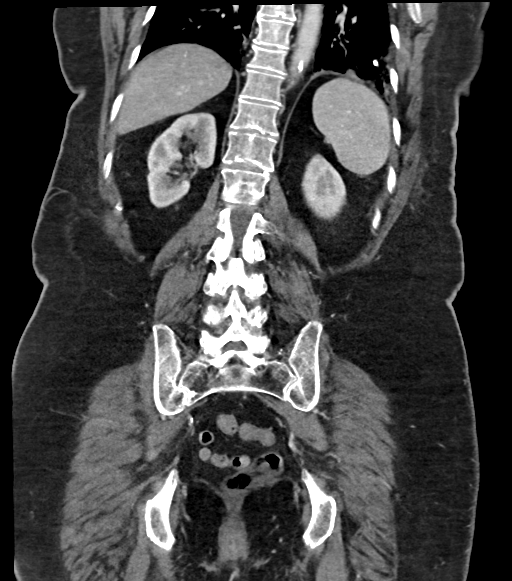
[im 45/102  soft-tissue]
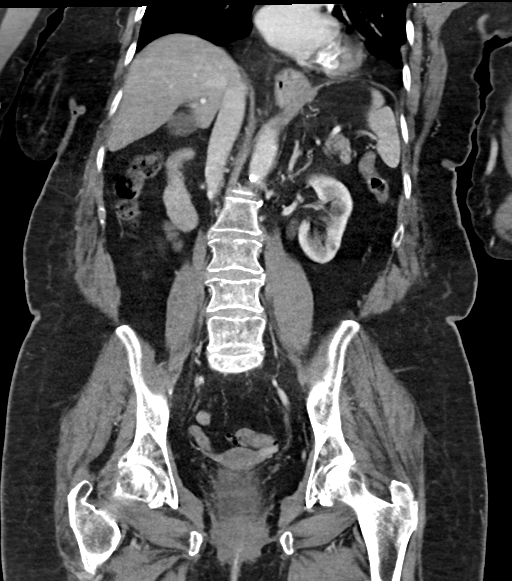
[im 57/102  soft-tissue]
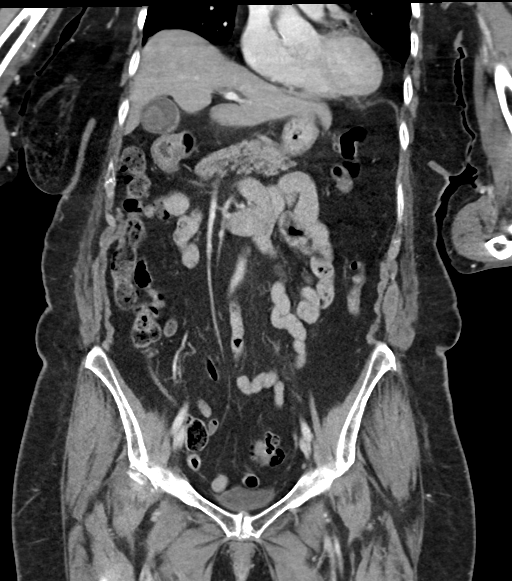

[16 of 46 positions shown; findings below may reference images not displayed]

FINDINGS: Lower chest: Mild to moderate severity atelectatic changes are seen
within the posterior aspect of the bilateral lung bases.

Hepatobiliary: No focal liver abnormality is seen. Multiple large
gallstones are seen within the gallbladder lumen. There is no
evidence of gallbladder wall thickening or pericholecystic
inflammation. The common bile duct measures approximately 7.6 mm in
diameter.

Pancreas: Unremarkable. No pancreatic ductal dilatation or
surrounding inflammatory changes.

Spleen: Normal in size without focal abnormality.

Adrenals/Urinary Tract: Adrenal glands are unremarkable. Kidneys are
normal in size, without renal calculi or hydronephrosis. A 1.1 cm
diameter cyst is seen within the anterior aspect of the mid left
kidney. The urinary bladder is poorly distended and subsequently
limited in evaluation.

Stomach/Bowel: There is a small hiatal hernia. The appendix is not
clearly identified. No evidence of bowel wall thickening,
distention, or inflammatory changes. Noninflamed diverticula are
seen throughout the large bowel.

Vascular/Lymphatic: Aortic atherosclerosis. No enlarged abdominal or
pelvic lymph nodes.

Reproductive: Uterus and bilateral adnexa are unremarkable.

Other: No abdominal wall hernia or abnormality. No abdominopelvic
ascites.

Musculoskeletal: No acute or significant osseous findings.
IMPRESSION: 1. Cholelithiasis without evidence of acute cholecystitis.
2. Small hiatal hernia.
3. Colonic diverticulosis.
4. Aortic atherosclerosis.

Aortic Atherosclerosis ([id number]).

## 2022-03-14 ENCOUNTER — Other Ambulatory Visit: Payer: Self-pay | Admitting: Internal Medicine

## 2022-03-16 ENCOUNTER — Telehealth: Payer: Self-pay | Admitting: Neurology

## 2022-03-16 NOTE — Telephone Encounter (Signed)
Pt's husband notifying [facility] pt passed away 03/28/2022.  Gave him our condolences.

## 2022-03-19 ENCOUNTER — Telehealth: Payer: Self-pay | Admitting: Internal Medicine

## 2022-03-19 NOTE — Telephone Encounter (Signed)
Patti with [facility] is calling there is death certificate in [facility]

## 2022-04-09 ENCOUNTER — Ambulatory Visit: Payer: Medicare PPO | Admitting: Neurology

## 2022-04-09 DEATH — deceased

## 2022-05-27 IMAGING — US US RENAL
1 series · 14 of 25 positions shown · non-contrast
Comparison: CT AP 03/10/2021

CLINICAL DATA: Urinary retention.

EXAM:
RENAL / URINARY TRACT ULTRASOUND COMPLETE

[Series 1: us renal · 14 of 31 slices shown]
[im 1/31]
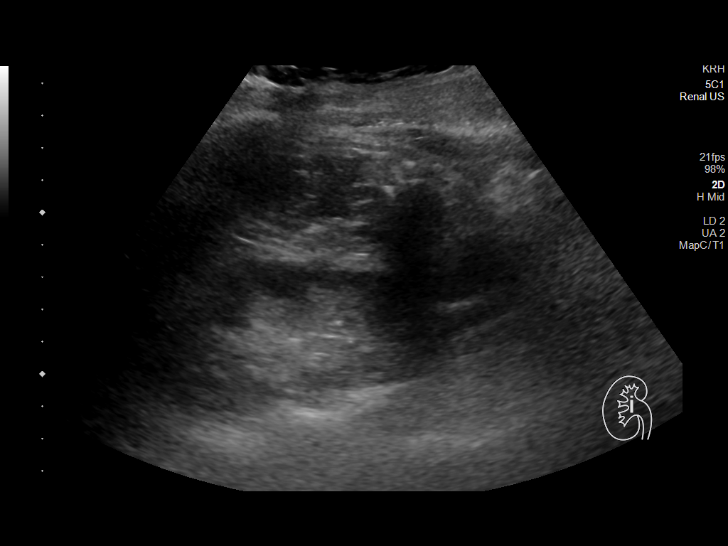
[im 3/31]
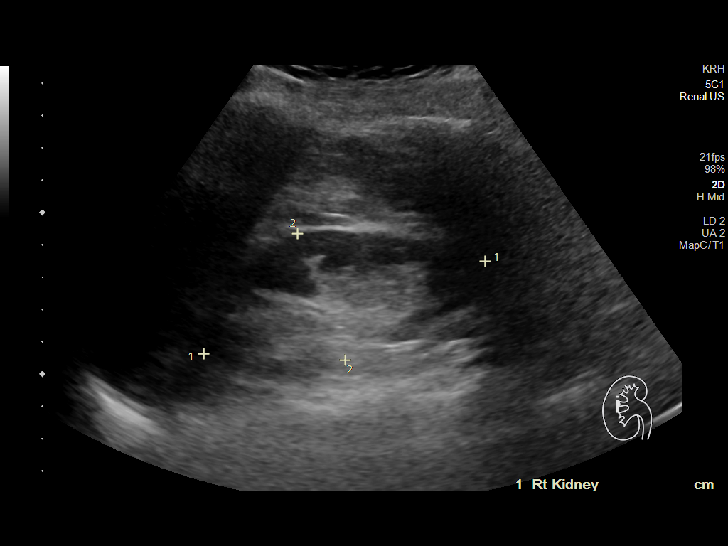
[im 6/31]
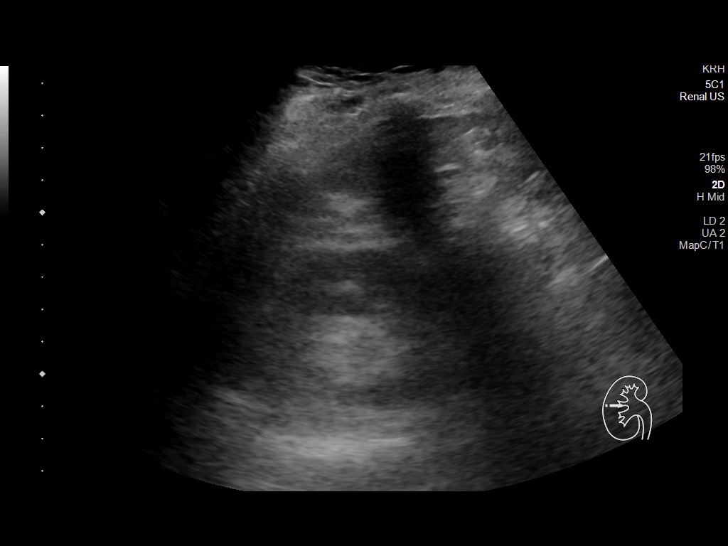
[im 8/31]
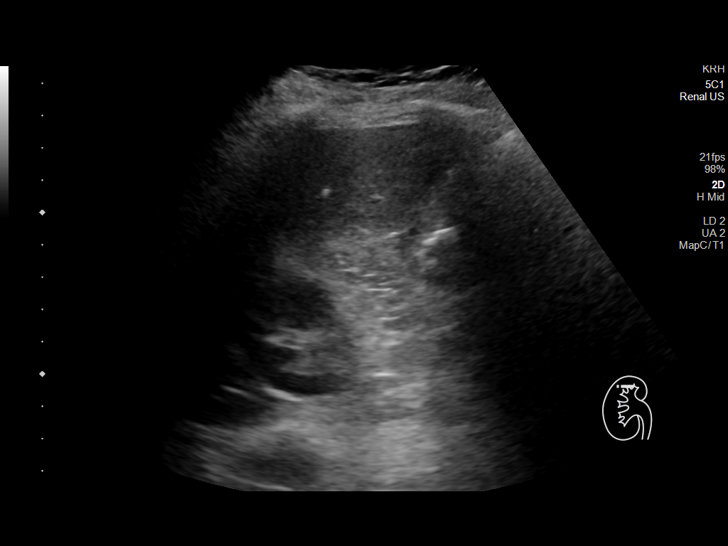
[im 11/31]
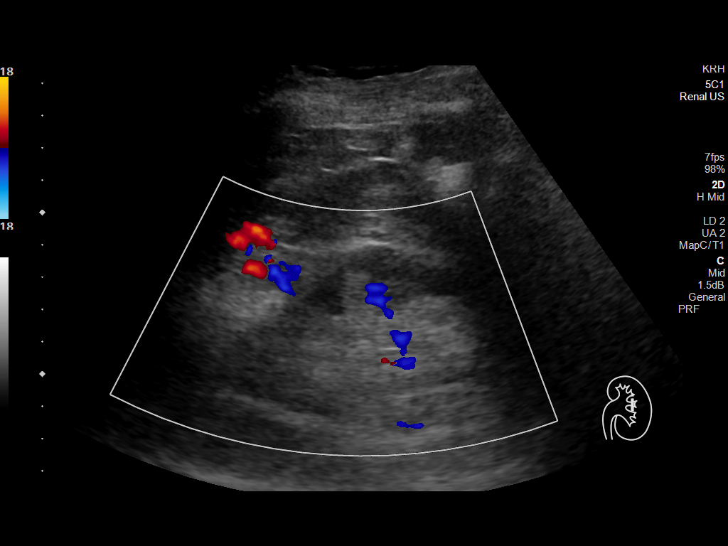
[im 12/31]
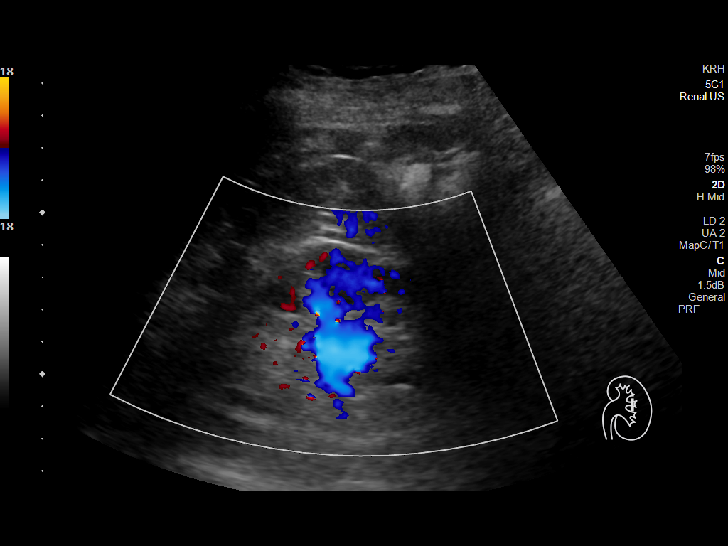
[im 14/31]
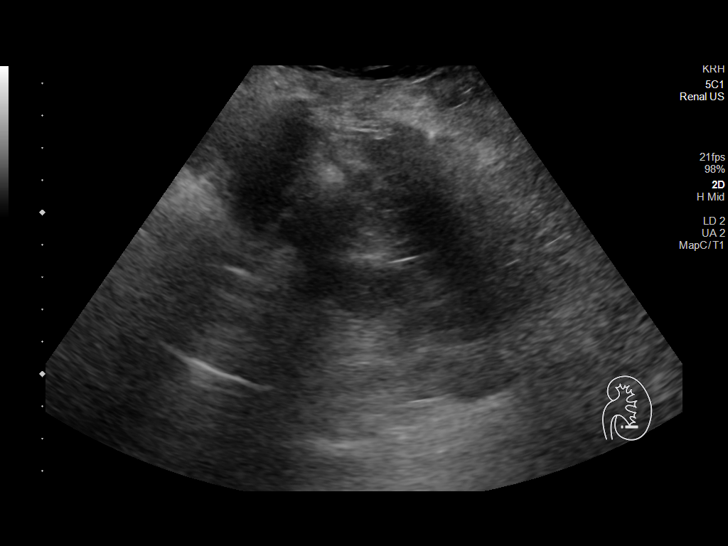
[im 17/31]
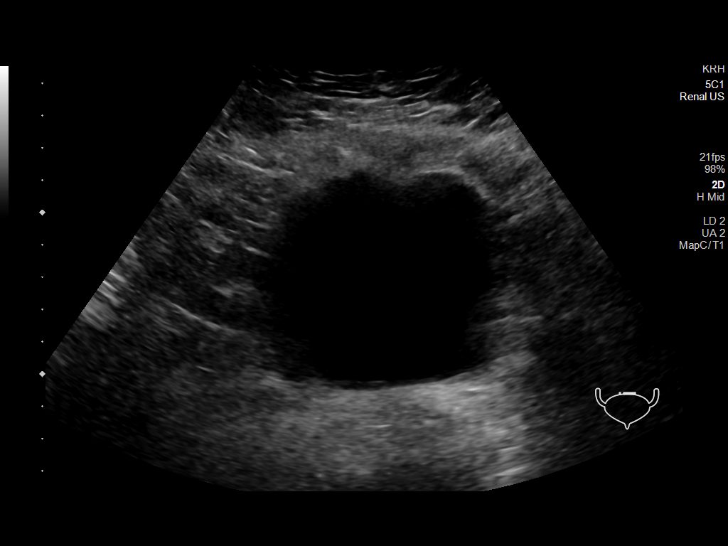
[im 19/31]
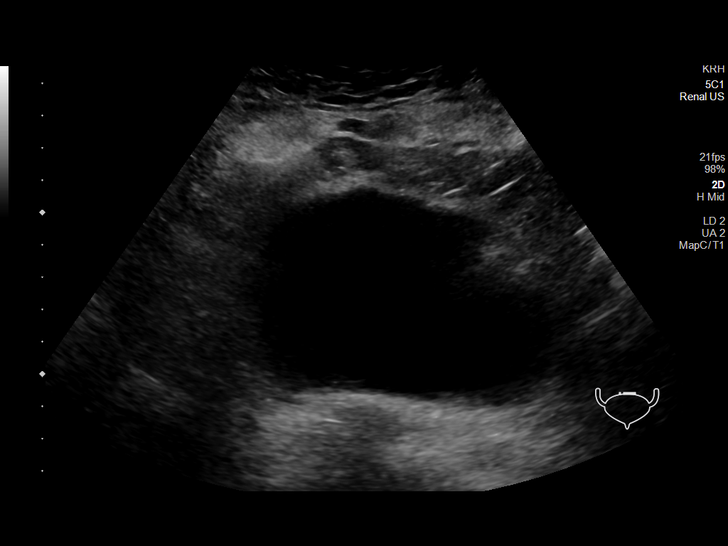
[im 21/31]
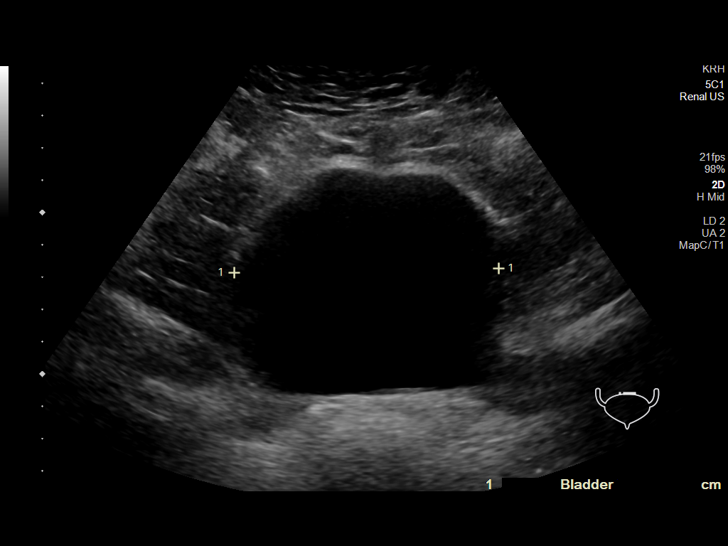
[im 23/31]
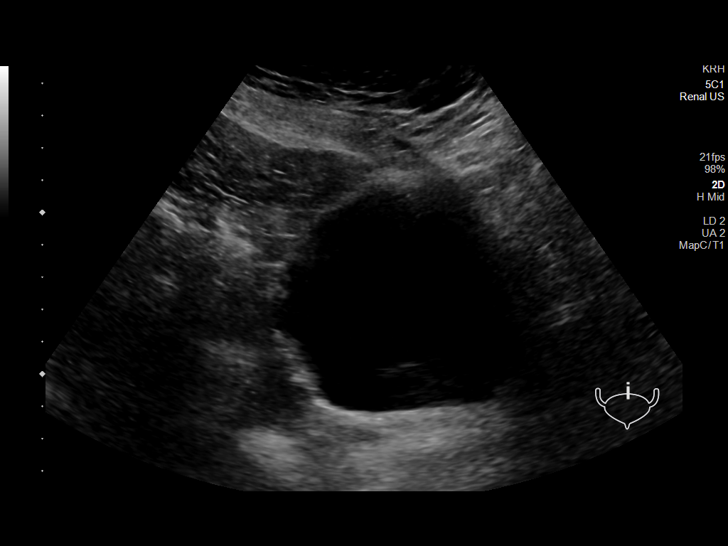
[im 26/31]
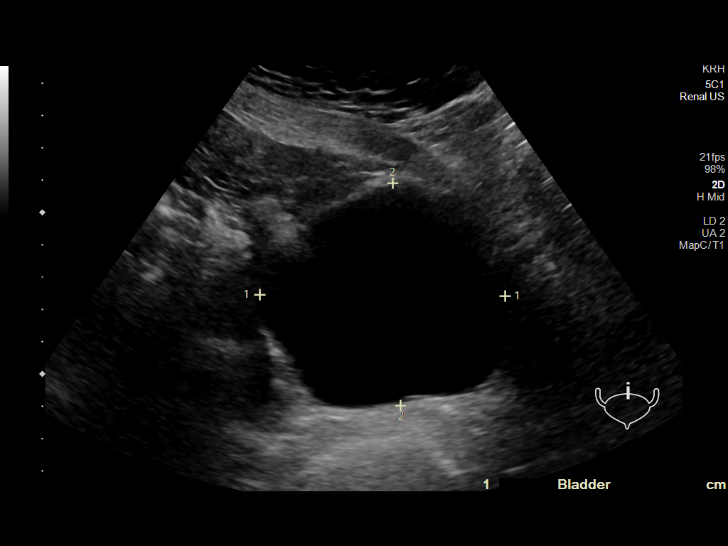
[im 28/31]
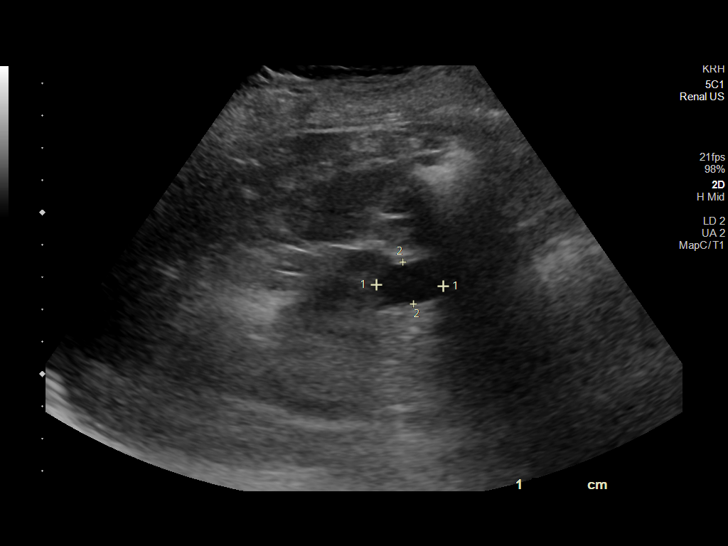
[im 31/31]
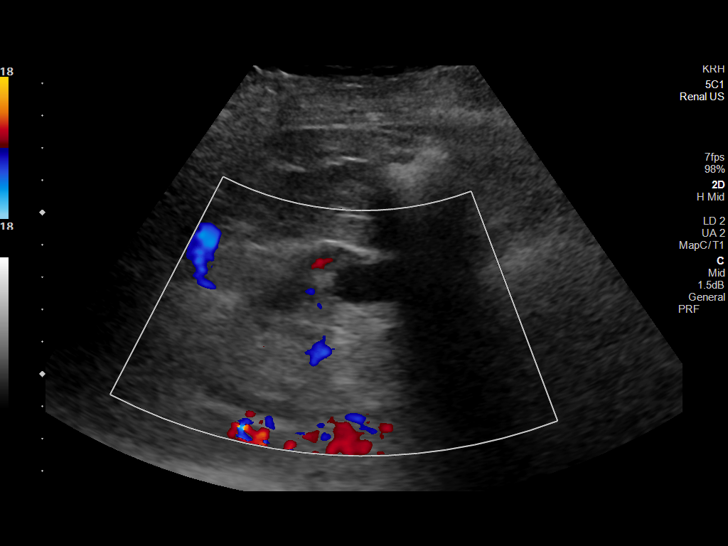

[14 of 25 positions shown; findings below may reference images not displayed]

FINDINGS: Right Kidney:

Renal measurements: 9.2 x 4.2 x 4.4 cm = volume: 87.5 mL.
Echogenicity within normal limits. No mass or hydronephrosis
visualized.

Left Kidney:

Renal measurements: 9 x 4.7 x 4.6 cm = volume: 102.6 mL.
Echogenicity within normal limits. No hydronephrosis. Cyst arising
off the lateral cortex of the left kidney measures 2.1 x 1.3 x
cm.

Bladder:

Appears normal. The prevoid bladder volume is equal to 224.3 cc.
Postvoid imaging not obtained as patient was unable to void.

Other:

None.
IMPRESSION: 1. No signs of obstructive uropathy.
2. Left kidney cyst.
